# Patient Record
Sex: Male | Born: 1975
Health system: Southern US, Community
[De-identification: ages and names within clinical notes are randomized; demographics above are authoritative.]

## PROBLEM LIST (undated history)

## (undated) DIAGNOSIS — E7800 Pure hypercholesterolemia, unspecified: Secondary | ICD-10-CM

## (undated) DIAGNOSIS — I255 Ischemic cardiomyopathy: Secondary | ICD-10-CM

## (undated) DIAGNOSIS — I219 Acute myocardial infarction, unspecified: Secondary | ICD-10-CM

## (undated) DIAGNOSIS — I472 Ventricular tachycardia, unspecified: Secondary | ICD-10-CM

## (undated) DIAGNOSIS — G4733 Obstructive sleep apnea (adult) (pediatric): Principal | ICD-10-CM

## (undated) DIAGNOSIS — C801 Malignant (primary) neoplasm, unspecified: Secondary | ICD-10-CM

## (undated) DIAGNOSIS — M545 Low back pain, unspecified: Secondary | ICD-10-CM

## (undated) DIAGNOSIS — I639 Cerebral infarction, unspecified: Secondary | ICD-10-CM

## (undated) DIAGNOSIS — M549 Dorsalgia, unspecified: Secondary | ICD-10-CM

## (undated) DIAGNOSIS — I4729 Other ventricular tachycardia: Secondary | ICD-10-CM

## (undated) DIAGNOSIS — Z720 Tobacco use: Secondary | ICD-10-CM

## (undated) DIAGNOSIS — Z8619 Personal history of other infectious and parasitic diseases: Secondary | ICD-10-CM

## (undated) DIAGNOSIS — T827XXA Infection and inflammatory reaction due to other cardiac and vascular devices, implants and grafts, initial encounter: Secondary | ICD-10-CM

## (undated) DIAGNOSIS — I5022 Chronic systolic (congestive) heart failure: Secondary | ICD-10-CM

## (undated) DIAGNOSIS — I2510 Atherosclerotic heart disease of native coronary artery without angina pectoris: Secondary | ICD-10-CM

## (undated) DIAGNOSIS — K219 Gastro-esophageal reflux disease without esophagitis: Secondary | ICD-10-CM

## (undated) DIAGNOSIS — F411 Generalized anxiety disorder: Secondary | ICD-10-CM

## (undated) HISTORY — DX: Ventricular tachycardia, unspecified: I47.20

## (undated) HISTORY — DX: Low back pain: M54.5

## (undated) HISTORY — PX: CORONARY ANGIOPLASTY WITH STENT PLACEMENT: SHX49

## (undated) HISTORY — DX: Obstructive sleep apnea (adult) (pediatric): G47.33

## (undated) HISTORY — DX: Ventricular tachycardia: I47.2

## (undated) HISTORY — PX: CORONARY ARTERY BYPASS GRAFT: SHX141

## (undated) HISTORY — DX: Atherosclerotic heart disease of native coronary artery without angina pectoris: I25.10

## (undated) HISTORY — DX: Cerebral infarction, unspecified: I63.9

## (undated) HISTORY — DX: Other ventricular tachycardia: I47.29

## (undated) HISTORY — DX: Dorsalgia, unspecified: M54.9

## (undated) HISTORY — DX: Low back pain, unspecified: M54.50

## (undated) HISTORY — DX: Chronic systolic (congestive) heart failure: I50.22

## (undated) HISTORY — PX: CARDIAC DEFIBRILLATOR PLACEMENT: SHX171

## (undated) HISTORY — DX: Tobacco use: Z72.0

## (undated) HISTORY — DX: Ischemic cardiomyopathy: I25.5

## (undated) HISTORY — DX: Gastro-esophageal reflux disease without esophagitis: K21.9

## (undated) HISTORY — DX: Acute myocardial infarction, unspecified: I21.9

## (undated) HISTORY — DX: Pure hypercholesterolemia, unspecified: E78.00

## (undated) HISTORY — DX: Generalized anxiety disorder: F41.1

---

## 1997-12-01 ENCOUNTER — Emergency Department (HOSPITAL_COMMUNITY): Admission: EM | Admit: 1997-12-01 | Discharge: 1997-12-01 | Payer: Self-pay | Admitting: Emergency Medicine

## 1997-12-01 ENCOUNTER — Encounter: Payer: Self-pay | Admitting: Emergency Medicine

## 2002-05-24 ENCOUNTER — Emergency Department (HOSPITAL_COMMUNITY): Admission: EM | Admit: 2002-05-24 | Discharge: 2002-05-24 | Payer: Self-pay | Admitting: Emergency Medicine

## 2002-07-31 ENCOUNTER — Encounter: Payer: Self-pay | Admitting: Emergency Medicine

## 2002-07-31 ENCOUNTER — Emergency Department (HOSPITAL_COMMUNITY): Admission: EM | Admit: 2002-07-31 | Discharge: 2002-07-31 | Payer: Self-pay | Admitting: Emergency Medicine

## 2008-01-29 ENCOUNTER — Inpatient Hospital Stay (HOSPITAL_COMMUNITY): Admission: EM | Admit: 2008-01-29 | Discharge: 2008-02-02 | Payer: Self-pay | Admitting: Emergency Medicine

## 2008-01-29 ENCOUNTER — Ambulatory Visit: Payer: Self-pay | Admitting: Cardiology

## 2008-01-30 ENCOUNTER — Encounter: Payer: Self-pay | Admitting: Cardiology

## 2008-05-07 ENCOUNTER — Emergency Department (HOSPITAL_BASED_OUTPATIENT_CLINIC_OR_DEPARTMENT_OTHER): Admission: EM | Admit: 2008-05-07 | Discharge: 2008-05-07 | Payer: Self-pay | Admitting: Emergency Medicine

## 2008-09-17 ENCOUNTER — Inpatient Hospital Stay (HOSPITAL_COMMUNITY): Admission: EM | Admit: 2008-09-17 | Discharge: 2008-09-20 | Payer: Self-pay | Admitting: Emergency Medicine

## 2008-09-18 ENCOUNTER — Encounter (INDEPENDENT_AMBULATORY_CARE_PROVIDER_SITE_OTHER): Payer: Self-pay | Admitting: Cardiology

## 2008-09-20 ENCOUNTER — Encounter: Payer: Self-pay | Admitting: Internal Medicine

## 2008-11-24 ENCOUNTER — Encounter: Payer: Self-pay | Admitting: Internal Medicine

## 2008-12-01 ENCOUNTER — Emergency Department (HOSPITAL_COMMUNITY): Admission: EM | Admit: 2008-12-01 | Discharge: 2008-12-01 | Payer: Self-pay | Admitting: Emergency Medicine

## 2008-12-01 ENCOUNTER — Encounter: Payer: Self-pay | Admitting: Internal Medicine

## 2008-12-19 ENCOUNTER — Encounter (INDEPENDENT_AMBULATORY_CARE_PROVIDER_SITE_OTHER): Payer: Self-pay | Admitting: Internal Medicine

## 2008-12-19 ENCOUNTER — Ambulatory Visit: Payer: Self-pay | Admitting: Internal Medicine

## 2008-12-19 DIAGNOSIS — M545 Low back pain: Secondary | ICD-10-CM

## 2008-12-19 DIAGNOSIS — I2510 Atherosclerotic heart disease of native coronary artery without angina pectoris: Secondary | ICD-10-CM | POA: Insufficient documentation

## 2008-12-19 DIAGNOSIS — M25529 Pain in unspecified elbow: Secondary | ICD-10-CM

## 2008-12-19 DIAGNOSIS — I219 Acute myocardial infarction, unspecified: Secondary | ICD-10-CM | POA: Insufficient documentation

## 2008-12-28 DIAGNOSIS — R079 Chest pain, unspecified: Secondary | ICD-10-CM | POA: Insufficient documentation

## 2008-12-29 ENCOUNTER — Ambulatory Visit: Payer: Self-pay | Admitting: Internal Medicine

## 2008-12-29 ENCOUNTER — Encounter (INDEPENDENT_AMBULATORY_CARE_PROVIDER_SITE_OTHER): Payer: Self-pay | Admitting: *Deleted

## 2009-01-02 ENCOUNTER — Ambulatory Visit: Payer: Self-pay | Admitting: Internal Medicine

## 2009-01-02 ENCOUNTER — Inpatient Hospital Stay (HOSPITAL_COMMUNITY): Admission: RE | Admit: 2009-01-02 | Discharge: 2009-01-03 | Payer: Self-pay | Admitting: Internal Medicine

## 2009-01-03 ENCOUNTER — Encounter: Payer: Self-pay | Admitting: Internal Medicine

## 2009-01-15 ENCOUNTER — Ambulatory Visit: Payer: Self-pay

## 2009-01-15 ENCOUNTER — Ambulatory Visit: Payer: Self-pay | Admitting: Internal Medicine

## 2009-01-15 ENCOUNTER — Encounter: Payer: Self-pay | Admitting: Internal Medicine

## 2009-02-08 ENCOUNTER — Encounter (INDEPENDENT_AMBULATORY_CARE_PROVIDER_SITE_OTHER): Payer: Self-pay | Admitting: *Deleted

## 2009-03-08 ENCOUNTER — Inpatient Hospital Stay (HOSPITAL_COMMUNITY): Admission: RE | Admit: 2009-03-08 | Discharge: 2009-03-09 | Payer: Self-pay | Admitting: Cardiology

## 2009-03-12 ENCOUNTER — Ambulatory Visit (HOSPITAL_COMMUNITY): Admission: RE | Admit: 2009-03-12 | Discharge: 2009-03-12 | Payer: Self-pay | Admitting: Cardiology

## 2009-03-12 ENCOUNTER — Telehealth: Payer: Self-pay | Admitting: Infectious Diseases

## 2009-03-13 ENCOUNTER — Encounter (HOSPITAL_COMMUNITY): Admission: RE | Admit: 2009-03-13 | Discharge: 2009-03-29 | Payer: Self-pay | Admitting: Cardiology

## 2009-03-26 ENCOUNTER — Ambulatory Visit: Payer: Self-pay | Admitting: Thoracic Surgery (Cardiothoracic Vascular Surgery)

## 2009-04-09 ENCOUNTER — Encounter: Payer: Self-pay | Admitting: Thoracic Surgery (Cardiothoracic Vascular Surgery)

## 2009-04-09 ENCOUNTER — Ambulatory Visit: Payer: Self-pay | Admitting: Vascular Surgery

## 2009-04-09 ENCOUNTER — Ambulatory Visit: Payer: Self-pay | Admitting: Thoracic Surgery (Cardiothoracic Vascular Surgery)

## 2009-04-12 ENCOUNTER — Telehealth (INDEPENDENT_AMBULATORY_CARE_PROVIDER_SITE_OTHER): Payer: Self-pay | Admitting: *Deleted

## 2009-04-13 ENCOUNTER — Ambulatory Visit: Payer: Self-pay | Admitting: Thoracic Surgery (Cardiothoracic Vascular Surgery)

## 2009-04-13 ENCOUNTER — Inpatient Hospital Stay (HOSPITAL_COMMUNITY)
Admission: RE | Admit: 2009-04-13 | Discharge: 2009-04-17 | Payer: Self-pay | Admitting: Thoracic Surgery (Cardiothoracic Vascular Surgery)

## 2009-04-25 ENCOUNTER — Ambulatory Visit: Payer: Self-pay | Admitting: Cardiothoracic Surgery

## 2009-05-04 ENCOUNTER — Encounter: Payer: Self-pay | Admitting: Internal Medicine

## 2009-05-07 ENCOUNTER — Ambulatory Visit: Payer: Self-pay | Admitting: Internal Medicine

## 2009-05-07 ENCOUNTER — Ambulatory Visit: Payer: Self-pay | Admitting: Thoracic Surgery (Cardiothoracic Vascular Surgery)

## 2009-05-07 ENCOUNTER — Encounter
Admission: RE | Admit: 2009-05-07 | Discharge: 2009-05-07 | Payer: Self-pay | Admitting: Thoracic Surgery (Cardiothoracic Vascular Surgery)

## 2009-05-16 ENCOUNTER — Ambulatory Visit: Payer: Self-pay | Admitting: Internal Medicine

## 2009-05-16 DIAGNOSIS — F411 Generalized anxiety disorder: Secondary | ICD-10-CM | POA: Insufficient documentation

## 2009-05-16 LAB — CONVERTED CEMR LAB
BUN: 14 mg/dL
Creatinine, Ser: 1.42 mg/dL
HCT: 39.7 %
Platelets: 138 10*3/uL

## 2009-06-13 ENCOUNTER — Ambulatory Visit: Payer: Self-pay | Admitting: Infectious Diseases

## 2009-07-06 ENCOUNTER — Telehealth (INDEPENDENT_AMBULATORY_CARE_PROVIDER_SITE_OTHER): Payer: Self-pay | Admitting: *Deleted

## 2009-08-08 ENCOUNTER — Encounter (INDEPENDENT_AMBULATORY_CARE_PROVIDER_SITE_OTHER): Payer: Self-pay | Admitting: *Deleted

## 2009-09-20 ENCOUNTER — Encounter (INDEPENDENT_AMBULATORY_CARE_PROVIDER_SITE_OTHER): Payer: Self-pay | Admitting: *Deleted

## 2009-11-06 ENCOUNTER — Encounter (INDEPENDENT_AMBULATORY_CARE_PROVIDER_SITE_OTHER): Payer: Self-pay | Admitting: *Deleted

## 2009-11-17 ENCOUNTER — Observation Stay (HOSPITAL_COMMUNITY): Admission: EM | Admit: 2009-11-17 | Discharge: 2009-11-18 | Payer: Self-pay | Admitting: Emergency Medicine

## 2009-11-17 ENCOUNTER — Ambulatory Visit: Payer: Self-pay | Admitting: Cardiology

## 2010-03-06 ENCOUNTER — Telehealth: Payer: Self-pay | Admitting: Physician Assistant

## 2010-03-06 ENCOUNTER — Ambulatory Visit: Payer: Self-pay | Admitting: Internal Medicine

## 2010-03-06 ENCOUNTER — Encounter: Payer: Self-pay | Admitting: Physician Assistant

## 2010-03-06 ENCOUNTER — Ambulatory Visit: Payer: Self-pay

## 2010-03-06 ENCOUNTER — Encounter: Payer: Self-pay | Admitting: Internal Medicine

## 2010-03-06 DIAGNOSIS — R0602 Shortness of breath: Secondary | ICD-10-CM

## 2010-03-07 ENCOUNTER — Ambulatory Visit (HOSPITAL_COMMUNITY)
Admission: RE | Admit: 2010-03-07 | Discharge: 2010-03-07 | Payer: Self-pay | Source: Home / Self Care | Attending: Physician Assistant | Admitting: Physician Assistant

## 2010-03-07 LAB — CONVERTED CEMR LAB
BUN: 9 mg/dL (ref 6–23)
Basophils Absolute: 0.1 10*3/uL (ref 0.0–0.1)
Basophils Relative: 0.8 % (ref 0.0–3.0)
Calcium: 9 mg/dL (ref 8.4–10.5)
Eosinophils Absolute: 0.5 10*3/uL (ref 0.0–0.7)
Eosinophils Relative: 6.2 % — ABNORMAL HIGH (ref 0.0–5.0)
MCHC: 33.7 g/dL (ref 30.0–36.0)
MCV: 88.8 fL (ref 78.0–100.0)
Monocytes Relative: 10.7 % (ref 3.0–12.0)
Neutrophils Relative %: 51.3 % (ref 43.0–77.0)
Platelets: 199 10*3/uL (ref 150.0–400.0)
Potassium: 4.3 meq/L (ref 3.5–5.1)
Pro B Natriuretic peptide (BNP): 151.4 pg/mL — ABNORMAL HIGH (ref 0.0–100.0)
WBC: 8.4 10*3/uL (ref 4.5–10.5)

## 2010-03-11 ENCOUNTER — Encounter: Payer: Self-pay | Admitting: Internal Medicine

## 2010-03-13 ENCOUNTER — Telehealth (INDEPENDENT_AMBULATORY_CARE_PROVIDER_SITE_OTHER): Payer: Self-pay | Admitting: Radiology

## 2010-03-14 ENCOUNTER — Ambulatory Visit: Payer: Self-pay

## 2010-03-14 ENCOUNTER — Encounter (HOSPITAL_COMMUNITY)
Admission: RE | Admit: 2010-03-14 | Discharge: 2010-04-30 | Payer: Self-pay | Source: Home / Self Care | Attending: Internal Medicine | Admitting: Internal Medicine

## 2010-03-14 ENCOUNTER — Encounter: Payer: Self-pay | Admitting: Cardiovascular Disease

## 2010-03-14 ENCOUNTER — Encounter: Payer: Self-pay | Admitting: Internal Medicine

## 2010-03-14 ENCOUNTER — Ambulatory Visit (HOSPITAL_COMMUNITY)
Admission: RE | Admit: 2010-03-14 | Discharge: 2010-03-14 | Payer: Self-pay | Source: Home / Self Care | Attending: Internal Medicine | Admitting: Internal Medicine

## 2010-03-18 ENCOUNTER — Telehealth: Payer: Self-pay | Admitting: Physician Assistant

## 2010-03-22 ENCOUNTER — Ambulatory Visit: Payer: Self-pay | Admitting: Internal Medicine

## 2010-03-22 DIAGNOSIS — I472 Ventricular tachycardia, unspecified: Secondary | ICD-10-CM | POA: Insufficient documentation

## 2010-03-22 DIAGNOSIS — I5042 Chronic combined systolic (congestive) and diastolic (congestive) heart failure: Secondary | ICD-10-CM | POA: Insufficient documentation

## 2010-04-10 ENCOUNTER — Other Ambulatory Visit: Payer: Self-pay | Admitting: Internal Medicine

## 2010-04-10 ENCOUNTER — Ambulatory Visit
Admission: RE | Admit: 2010-04-10 | Discharge: 2010-04-10 | Payer: Self-pay | Source: Home / Self Care | Attending: Internal Medicine | Admitting: Internal Medicine

## 2010-04-10 DIAGNOSIS — K219 Gastro-esophageal reflux disease without esophagitis: Secondary | ICD-10-CM | POA: Insufficient documentation

## 2010-04-10 LAB — BASIC METABOLIC PANEL
BUN: 17 mg/dL (ref 6–23)
CO2: 28 mEq/L (ref 19–32)
Calcium: 9.6 mg/dL (ref 8.4–10.5)
Chloride: 105 mEq/L (ref 96–112)
Creatinine, Ser: 1.4 mg/dL (ref 0.4–1.5)
GFR: 63.31 mL/min (ref >60.00)
Glucose, Bld: 80 mg/dL (ref 70–99)
Potassium: 4.6 mEq/L (ref 3.5–5.1)
Sodium: 140 mEq/L (ref 135–145)

## 2010-04-10 LAB — BRAIN NATRIURETIC PEPTIDE: Pro B Natriuretic peptide (BNP): 84.1 pg/mL (ref 0.0–100.0)

## 2010-04-28 LAB — CONVERTED CEMR LAB
Basophils Absolute: 0.1 10*3/uL (ref 0.0–0.1)
Basophils Relative: 1.2 % (ref 0.0–3.0)
CO2: 32 meq/L (ref 19–32)
Calcium: 9.2 mg/dL (ref 8.4–10.5)
Creatinine, Ser: 1.4 mg/dL (ref 0.4–1.5)
Eosinophils Absolute: 0.3 10*3/uL (ref 0.0–0.7)
Eosinophils Relative: 3.1 % (ref 0.0–5.0)
GFR calc non Af Amer: 61.15 mL/min (ref >60)
INR: 0.9 (ref 0.8–1.0)
Lymphocytes Relative: 31.8 % (ref 12.0–46.0)
Lymphs Abs: 3 10*3/uL (ref 0.7–4.0)
MCHC: 33.2 g/dL (ref 30.0–36.0)
MCV: 90 fL (ref 78.0–100.0)
Monocytes Absolute: 0.8 10*3/uL (ref 0.1–1.0)
Neutro Abs: 5.1 10*3/uL (ref 1.4–7.7)
Neutrophils Relative %: 55.8 % (ref 43.0–77.0)
Potassium: 4.3 meq/L (ref 3.5–5.1)
Sodium: 141 meq/L (ref 135–145)
WBC: 9.3 10*3/uL (ref 4.5–10.5)

## 2010-05-02 NOTE — Letter (Signed)
Summary: Appointment - Missed  [facility], Main Office  [address]   [place], [place] [address]   Phone: [phone]  Fax: [phone]     Aug 08, 2009 MRN: [id number]   COLESON KANT [address] [place], [place]  [address]   Dear Mr. HEMMER,  Our records indicate you missed your appointment on 5/9 with [facility]. It is very important that we reach you to reschedule this appointment. We look forward to participating in your health care needs. Please contact us at the number listed above at your earliest convenience to reschedule this appointment.     Sincerely,   Ruel Favors Scheduling Team

## 2010-05-02 NOTE — Assessment & Plan Note (Signed)
Summary: ACUTE-OUT OF XANAX/(RIOFRIO)/CFB   Vital Signs:  Patient profile:   35 year old male Height:      74 inches (187.96 cm) Weight:      252.9 pounds (114.95 kg) Temp:     97.0 degrees F (36.11 degrees C) oral Pulse rate:   71 / minute BP sitting:   113 / 67  (left arm) Cuff size:   large  Vitals Entered By: Krystal Eaton Duncan Dull) (May 16, 2009 9:24 AM) CC: refill xanax and percocet Is Patient Diabetic? No Pain Assessment Patient in pain? no      Nutritional Status BMI of > 30 = obese  Have you ever been in a relationship where you felt threatened, hurt or afraid?No   Does patient need assistance? Functional Status Self care Ambulation Normal   Immunization History:  Influenza Immunization History:    Influenza:  historical (09/28/2008)   Primary Care Provider:  [facility] out pt  CC:  refill xanax and percocet.  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 Patient has been on Xanax for over 1 year now, with 2 tabs daily.   2) Refill on Percocet - patient reports he received percocet after surgery for pain management. He reports he still has pain on the scar. He also has back pain.   Preventive Screening-Counseling & Management  Alcohol-Tobacco     Smoking Status: quit < 6 months     Smoking Cessation Counseling: yes     Packs/Day: 1/2 ppd  -  Date:  05/16/2009    BUN: 14    Creatinine: 1.42    Sodium: 134    Potassium: 4.1    Chloride: 94    CO2 Total: 31    WBC: 16.2    HGB: 13.1    HCT: 39.7    PLT: 138  Current Medications (verified): 1)  Aspirin 325 Mg Tabs (Aspirin) .Marland Kitchen.. 1 Tab Daily 2)  Metoprolol Tartrate 25 Mg Tabs (Metoprolol  Tartrate) .... Take 1 Tablet By Mouth Two Times A Day 3)  Simvastatin 40 Mg Tabs (Simvastatin) .Marland Kitchen.. 1 Tab Daily 4)  Alprazolam 1 Mg Tabs (Alprazolam) .Marland Kitchen.. 1 Tab Two Times A Day 5)  Percocet 5-325 Mg Tabs (Oxycodone-Acetaminophen) .... Uad 6)  Flexeril 10 Mg Tabs (Cyclobenzaprine Hcl) .... As Needed 7)  Pacerone 200 Mg Tabs (Amiodarone Hcl) .... Take 1 Tablet By Mouth Two Times A Day 8)  Aspir-Trin 325 Mg Tbec (Aspirin) .... Take 1 Tablet By Mouth Once A Day  Allergies (verified): No Known Drug Allergies  Past History:  Past Medical History: Last updated: 05/07/2009 CAD s/p AMI 10/09 and 6/10 S/p PCI proximal LAD 10/09 with PTCA of ISR 6/10 Single vessel CABG 1/11 Ischemic CM (EF 30%) NYHA Class III CHF HYPERCHOLESTEROLEMIA (ICD-272.0) LOW BACK PAIN  ELBOW PAIN, RIGHT  TOBACCO ABUSE   Family History: Last updated: January 14, 2009 Father- first MI at 37, died at 55 after mult MI Mother- denies DM, HTN, or CAD No other relatives with early onset CAD  Social History: Last updated: 05/16/2009 Pt lives with his mom, brother, and 67 yo son in [place]. Quit in Jan. 2011 prior to heart surgery (prior -1/2 PPD (down  from 1 ppd per day, onset age 32)  alcohol abuse- prior, hasn't drank since age 47 no illicit drug use  Currently unemployed, awaiting disability Single  Risk Factors: Smoking Status: quit < 6 months (05/16/2009) Packs/Day: 1/2 ppd (05/16/2009)  Past Surgical History: S/P CABG 03/2009 due to reocclusion of LAD ICD placement 0ct. 2010  LAD stenting 10/09 and re-opening 6/10  Repeat catheterization 6/10 after PTCA:showed his EF was around 30-35% with anterior  akinesis.  He had 30% in-stent restenosis of the proximal LAD lesion,  30% proximal lesion in the second diagonal and a 40% proximal lesion in  the LAD at the bifurcation of the second diagonal.  There did not appear  to be obstructive disease to the point where he required any more  vascularization such as  bypass surgery.  Social History: Pt lives with his mom, brother, and 71 yo son in [place]. Quit in Jan. 2011 prior to heart surgery (prior -1/2 PPD (down from 1 ppd per day, onset age 49)  alcohol abuse- prior, hasn't drank since age 70 no illicit drug use  Currently unemployed, awaiting disability Single Smoking Status:  quit < 6 months  Review of Systems CV:  Denies chest pain or discomfort, difficulty breathing at night, difficulty breathing while lying down, fainting, fatigue, leg cramps with exertion, lightheadness, palpitations, shortness of breath with exertion, and swelling of feet. Resp:  Denies cough, shortness of breath, and sputum productive. GI:  Denies abdominal pain, constipation, diarrhea, nausea, and vomiting. MS:  Complains of mid back pain and stiffness. Derm:  Denies poor wound healing.  Physical Exam  General:  alert and well-developed.   Head:  normocephalic and atraumatic.   Eyes:  vision grossly intact, pupils equal, pupils round, and pupils reactive to light.   Mouth:  fair dentition.   Neck:  supple, full ROM, no masses, and no JVD.   Chest Wall:  surgical scar and sternotomy scar.   Lungs:  normal respiratory effort and normal breath sounds.   Heart:  normal rate, no murmur, no gallop, no rub, and irregular rhythm.   Abdomen:  soft, non-tender, normal bowel sounds, no distention, no masses, and no guarding.   Msk:  normal ROM, no joint tenderness, no joint swelling, and no joint warmth.   Pulses:  R radial normal and L radial normal.   Extremities:  no LE edema  Neurologic:  alert & oriented X3 and cranial nerves II-XII intact.   Psych:  normally interactive, good eye contact, and not anxious appearing.     Impression & Recommendations:  Problem # 1:  ISCHEMIC CARDIOMYOPATHY (ICD-414.8) Assessment Comment Only Patient has extensive cardiac history with 2 MIs (10/09 and 06/10), s/p stenting, PCTA, and ICD placement 12/2008. As discussed  thoroughly in last discharge summary, patient developed signs of CHF, as well as fatigue and chest pain. Repeat cardiac cath in 02/2009 revealed reocclusion of LAD and patient is now s/p CABG 03/2009. Patient developed acute renal failure and a-fib post-operatively. Pt is followed by [facility] and is scheduled to be seen 05/16/2009. Patient has no chest pain, and is doing well post-operatively. Will defer to cardiology for cardiac management.   His updated medication list for this problem includes:    Aspirin 325 Mg Tabs (Aspirin) .Marland Kitchen... 1 tab daily    Metoprolol Tartrate 25 Mg Tabs (Metoprolol tartrate) .Marland Kitchen... Take 1 tablet by mouth two times a day    Pacerone 200 Mg Tabs (Amiodarone hcl) .Marland Kitchen... Take 1 tablet by  mouth two times a day    Aspir-trin 325 Mg Tbec (Aspirin) .Marland Kitchen... Take 1 tablet by mouth once a day  Problem # 2:  ANXIETY (ICD-300.00) Assessment: Comment Only Patient has hx of anxiety 2/2 stress. Pt reports his stress is related to obtaining disability so that he is able to provide for his 22 year old son. He has been on Xanax for over 1 year, and reports it has been helping his anxiety levels. I will refill his prescription at his current dose.  His updated medication list for this problem includes:    Alprazolam 1 Mg Tabs (Alprazolam) .Marland Kitchen... 1 tab two times a day  Problem # 3:  CHEST PAIN (ICD-786.50) Assessment: Comment Only S/P CABG 03/2009. Pt has residual pain from surgery and was given Percocet for pain management. He states he initially received Tramadol and developed nausea and vomiting. He also mentions that he has the same reaction to Oxycontin. He is still having some pain from the surgical site. The scar is healing well. I explained to the patient that his pain levels should be decreasing as more time passes. I will refill his prescription, and instructed patient to follow up in 1 month, to reassess his pain.   Complete Medication List: 1)  Aspirin 325 Mg Tabs  (Aspirin) .Marland Kitchen.. 1 tab daily 2)  Metoprolol Tartrate 25 Mg Tabs (Metoprolol tartrate) .... Take 1 tablet by mouth two times a day 3)  Simvastatin 40 Mg Tabs (Simvastatin) .Marland Kitchen.. 1 tab daily 4)  Alprazolam 1 Mg Tabs (Alprazolam) .Marland Kitchen.. 1 tab two times a day 5)  Percocet 5-325 Mg Tabs (Oxycodone-acetaminophen) .... Uad 6)  Flexeril 10 Mg Tabs (Cyclobenzaprine hcl) .... As needed 7)  Pacerone 200 Mg Tabs (Amiodarone hcl) .... Take 1 tablet by mouth two times a day 8)  Aspir-trin 325 Mg Tbec (Aspirin) .... Take 1 tablet by mouth once a day  Patient Instructions: 1)  Please schedule a follow-up appointment in 1 month. Prescriptions: ALPRAZOLAM 1 MG TABS (ALPRAZOLAM) 1 tab two times a day  #60 x 0   Entered and Authorized by:   Melida Quitter MD   Signed by:   Melida Quitter MD on 05/16/2009   Method used:   Print then Give to Patient   RxID:   [id number] PERCOCET 5-325 MG TABS (OXYCODONE-ACETAMINOPHEN) UAD  #60 x 0   Entered and Authorized by:   Melida Quitter MD   Signed by:   Melida Quitter MD on 05/16/2009   Method used:   Print then Give to Patient   RxID:   [id number]    Prevention & Chronic Care Immunizations   Influenza vaccine: Historical  (09/28/2008)   Influenza vaccine deferral: Refused  (05/16/2009)    Tetanus booster: Not documented    Pneumococcal vaccine: Not documented  Other Screening   Smoking status: quit < 6 months  (05/16/2009)  Lipids   Total Cholesterol: Not documented   LDL: Not documented   LDL Direct: Not documented   HDL: Not documented   Triglycerides: Not documented    SGOT (AST): Not documented   SGPT (ALT): Not documented   Alkaline phosphatase: Not documented   Total bilirubin: Not documented  Self-Management Support :    Patient will work on the following items until the next clinic visit to reach self-care goals:     Medications and monitoring: take my medicines every day  (05/16/2009)     Eating: eat more vegetables, eat foods  that are low in salt, eat  baked foods instead of fried foods  (05/16/2009)    Lipid self-management support: Not documented

## 2010-05-02 NOTE — Progress Notes (Signed)
Summary: pt rtn call from friday  Phone Note Call from Patient Call back at [phone] or [phone]   Caller: Patient Reason for Call: Talk to Nurse, Talk to Doctor Summary of Call: pt rtn call from friday Initial call taken by: Omer Jack,  March 18, 2010 2:26 PM  Follow-up for Phone Call        Phone Call Completed PT'S MOTHER AWARE TO HAVE PT  DECREASE SIMVASTATIN TO 20 MG AND TO REPEAT LIPID LIVER IN 8 WEEKS Follow-up by: Scherrie Bateman, LPN,  March 18, 2010 2:58 PM  Additional Follow-up for Phone Call Additional follow up Details #1::        Thanks Additional Follow-up by: Brynda Rim,  March 18, 2010 5:37 PM

## 2010-05-02 NOTE — Assessment & Plan Note (Signed)
Summary: f2w   Visit Type:  Follow-up Referring Provider:  Corliss Marcus, MD Primary Provider:   out pt   History of Present Illness: Mr Ende is a pleasant 35 yo WM s/p ICD for primary prevention of SCD who presents today for cardiology and ep follow-up.  He reports moderate improvement in dypsnea with recently added lasix.  His chest discomfort and "indigestion" has significantly improved with nexium and imdur, though he reports headaches with imdur.  He continues to have 6 pillow orthopnea.  He denies edema.   He denies palpitations, presyncope, syncope, or other concerns. He has been previously noncompliant with office visits due to finances but states that he is compliant with medication.    Current Medications (verified): 1)  Aspirin 325 Mg Tabs (Aspirin) .Marland Kitchen.. 1 Tab Daily 2)  Metoprolol Tartrate 25 Mg Tabs (Metoprolol Tartrate) .... Take 1 Tablet By Mouth Two Times A Day 3)  Simvastatin 40 Mg Tabs (Simvastatin) .... Take 1/2 Tablet Daily(20mg ) 4)  Alprazolam 1 Mg Tabs (Alprazolam) .... As Needed 5)  Flonase 50 Mcg/act Susp (Fluticasone Propionate) .... One Spray Each Nostril Daily 6)  Lisinopril 10 Mg Tabs (Lisinopril) .... Take 1/2 Tablet By Mouth Daily 7)  Isosorbide Mononitrate Cr 30 Mg Xr24h-Tab (Isosorbide Mononitrate) .... Take One Tablet By Mouth Daily 8)  Nexium 40 Mg Cpdr (Esomeprazole Magnesium) .... One By Mouth Daily 9)  Nitrostat 0.4 Mg Subl (Nitroglycerin) .... Use As Directed  Allergies: 1)  ! * Oxycontin  Past History:  Past Medical History: Reviewed history from 03/22/2010 and no changes required. CAD s/p AMI 10/09 and 6/10 S/p PCI proximal LAD 10/09 with PTCA of ISR 6/10 Single vessel CABG 1/11   a.  cath 02/2009:  CFX and RCA ok with R-L collats. to occluded LAD   b.  placed on amiodarone post op for afib Ischemic CM (EF 30%) NYHA Class III CHF HYPERCHOLESTEROLEMIA (ICD-272.0) LOW BACK PAIN  ELBOW PAIN, RIGHT  TOBACCO ABUSE  Nonsustained  ventricular tachycardia  Past Surgical History: Reviewed history from 05/16/2009 and no changes required. S/P CABG 03/2009 due to reocclusion of LAD ICD placement 0ct. 2010  LAD stenting 10/09 and re-opening 6/10  Repeat catheterization 6/10 after PTCA:showed his EF was around 30-35% with anterior  akinesis.  He had 30% in-stent restenosis of the proximal LAD lesion,  30% proximal lesion in the second diagonal and a 40% proximal lesion in  the LAD at the bifurcation of the second diagonal.  There did not appear  to be obstructive disease to the point where he required any more  vascularization such as bypass surgery.  Social History: Reviewed history from 05/16/2009 and no changes required. Pt lives with his mom, brother, and 4 yo son in [place]. Quit in Jan. 2011 prior to heart surgery (prior -1/2 PPD (down from 1 ppd per day, onset age 63)  alcohol abuse- prior, hasn't drank since age 31 no illicit drug use  Currently unemployed, awaiting disability Single  Review of Systems       All systems are reviewed and negative except as listed in the HPI.   Vital Signs:  Patient profile:   35 year old male Height:      74 inches Weight:      279 pounds BMI:     35.95 Pulse rate:   75 / minute BP sitting:   116 / 78  (left arm)  Vitals Entered By: Laurance Flatten CMA (April 10, 2010 10:18 AM)  Physical Exam  General:  Well developed, well nourished, in no acute distress. Head:  normocephalic and atraumatic Eyes:  PERRLA/EOM intact; conjunctiva and lids normal. Mouth:  Teeth, gums and palate normal. Oral mucosa normal. Neck:  Neck supple, no JVD. No masses, thyromegaly or abnormal cervical nodes. Lungs:  Clear bilaterally to auscultation and percussion. Heart:  Non-displaced PMI, chest non-tender; regular rate and rhythm, S1, S2 without murmurs, rubs or gallops. Carotid upstroke normal, no bruit. Normal abdominal aortic size, no bruits. Femorals normal pulses, no bruits. Pedals  normal pulses. No edema, no varicosities. Abdomen:  Bowel sounds positive; abdomen soft and non-tender without masses, organomegaly, or hernias noted. No hepatosplenomegaly. Msk:  Back normal, normal gait. Muscle strength and tone normal. Extremities:  No clubbing or cyanosis. Neurologic:  Alert and oriented x 3. Skin:  see exam under "chest wall"    ICD Specifications Following MD:  Hillis Range, MD     Referring MD:  [facility] ICD Vendor:  [employer]     ICD Model Number:  E110     ICD Serial Number:  [id number] ICD DOI:  01/02/2009     ICD Implanting MD:  Hillis Range, MD  Lead 1:    Location: RA     DOI: 01/02/2009     Model #: [id number]     Serial #: [id number]     Status: active Lead 2:    Location: RV     DOI: 01/02/2009     Model #: [id number]     Serial #: [id number]     Status: active  Indications::  ICM   ICD Follow Up Battery Voltage:  GOOD V     Charge Time:  8.6 seconds     Battery Est. Longevity:  10.5 YRS Underlying rhythm:  SR ICD Dependent:  No       ICD Device Measurements Atrium:  Amplitude: 6.0 mV, Impedance: 578 ohms, Threshold: 0.7 V at 0.4 msec Right Ventricle:  Amplitude: 19.8 mV, Impedance: 506 ohms, Threshold: 1.2 V at 0.4 msec Shock Impedance: 68 ohms   Episodes MS Episodes:  0     Coumadin:  No Shock:  0     ATP:  0     Nonsustained:  0     Atrial Therapies:  0 Atrial Pacing:  <1%     Ventricular Pacing:  0%  Brady Parameters Mode DDI     Lower Rate Limit:  50     PAV 350      Tachy Zones VF:  200     VT:  170     Next Cardiology Appt Due:  07/01/2010 Tech Comments:  NORMAL DEVICE FUNCTION.  NO EPISODES SINCE LAST CHECK.  NO CHANGES MADE. ROV IN 3 MTHS W/DEVICE CLINIC. 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 1 tab daily    Carvedilol 6.25 Mg Tabs (Carvedilol) ..... One by mouth bid    Lisinopril 10 Mg Tabs (Lisinopril) .Marland KitchenMarland KitchenMarland KitchenMarland Kitchen  Take 1tablet by mouth daily    Isosorbide Mononitrate Cr 30 Mg Xr24h-tab (Isosorbide mononitrate) .Marland Kitchen... Take one tablet by mouth daily    Nitrostat 0.4 Mg Subl (Nitroglycerin) ..... Use as directed  Problem # 3:  DYSPNEA (ICD-786.05)  continue lasix check BNP  The following medications were removed from the medication list:    Furosemide 40 Mg Tabs (Furosemide) .Marland Kitchen... Take one tablet by mouth daily. His updated medication list for this problem includes:    Aspirin 325 Mg Tabs (Aspirin) .Marland Kitchen... 1 tab daily    Carvedilol 6.25 Mg Tabs (Carvedilol) ..... One by mouth bid    Lisinopril 10 Mg Tabs (Lisinopril) .Marland Kitchen... Take 1tablet by mouth daily  Orders: TLB-BMP (Basic Metabolic Panel-BMET) (80048-METABOL) TLB-BNP (B-Natriuretic Peptide) (83880-BNPR)  Problem # 4:  TOBACCO ABUSE (ICD-305.1) ongoing cessation encouraged  Problem # 5:  GERD (ICD-530.81)  improved with nexium consider GI referral if remains an issue upon return  His updated medication list for this problem includes:    Nexium 40 Mg Cpdr (Esomeprazole magnesium) ..... One by mouth daily  Patient Instructions: 1)  Your physician recommends that you schedule a follow-up appointment in: 6 weeks with Dr Johney Frame and 3 months in the device clinic 2)  Your physician has recommended you make the following change in your medication: increase Lisinopril to 10mg  daily 3)  stop Metoprolol 4)  start Carvedilolo 6.25mg  two times a day 5)  take Isosorbide at night 6)  Your  physician has requested that you limit the intake of sodium (salt) in your diet to two grams daily. Please see [facility] handout. Prescriptions: CARVEDILOL 6.25 MG TABS (CARVEDILOL) one by mouth bid  #60 x 11   Entered by:   Dennis Bast, RN, BSN   Authorized by:   Hillis Range, MD   Signed by:   Dennis Bast, RN, BSN on 04/10/2010   Method used:   Electronically to        [facility]  [phone]* (retail)       [address]       [place], [place]  [address]       Ph: [phone] or [phone]       Fax: [phone]   RxID:   [id number]

## 2010-05-02 NOTE — Progress Notes (Signed)
  Recieved request from [facility] forwarded to [facility]  July 06, 2009 8:37 AM

## 2010-05-02 NOTE — Cardiovascular Report (Signed)
Summary: ICD Industry Check  ICD Industry Check   Imported By: Roderic Ovens 05/16/2009 10:55:16  _____________________________________________________________________  External Attachment:    Type:   Image     Comment:   External Document

## 2010-05-02 NOTE — Cardiovascular Report (Signed)
Summary: Office Visit   Office Visit   Imported By: Roderic Ovens 04/12/2010 09:23:24  _____________________________________________________________________  External Attachment:    Type:   Image     Comment:   External Document

## 2010-05-02 NOTE — Progress Notes (Signed)
  Phone Note Outgoing Call   Summary of Call: Simvastatin changed to 20 mg at bedtime at OV 12/7. Please schedule FLP and LFTs in 8 weeks.  Initial call taken by: Tereso Newcomer PA-C,  March 06, 2010 1:18 PM

## 2010-05-02 NOTE — Miscellaneous (Signed)
  Clinical Lists Changes  Observations: Added new observation of CARDCATHFIND:  1. One-vessel obstructive coronary artery disease with occluded left       anterior descending stent.  This is the third time the stent has       become occluded.   2. Severe left ventricular dysfunction, ejection fraction 25-30% with       severe anterior akinesis.   3. Status post automatic implantable cardioverter-defibrillator.      PLAN:  Admit to telemetry bed, IV heparin, nitroglycerin drip, and   aspirin.  We will review the films of Dr. Eldridge Dace and consider PCI   versus CTS consult.  First, we need to determine whether there is   viability in the anterior wall, which will get a resting thallium for   this.  (03/08/2009 15:43)      Cardiac Cath  Procedure date:  03/08/2009  Findings:       1. One-vessel obstructive coronary artery disease with occluded left       anterior descending stent.  This is the third time the stent has       become occluded.   2. Severe left ventricular dysfunction, ejection fraction 25-30% with       severe anterior akinesis.   3. Status post automatic implantable cardioverter-defibrillator.      PLAN:  Admit to telemetry bed, IV heparin, nitroglycerin drip, and   aspirin.  We will review the films of Dr. Eldridge Dace and consider PCI   versus CTS consult.  First, we need to determine whether there is   viability in the anterior wall, which will get a resting thallium for   this.

## 2010-05-02 NOTE — Assessment & Plan Note (Signed)
Summary: 2 wks/okmper kelly/[email or web address]@ [email or web address]   Visit Type:  Initial Consult Referring Provider:  Corliss Marcus, MD Primary Provider:  [facility] out pt   History of Present Illness: [name] is a pleasant 35 yo WM s/p ICD for primary prevention of SCD who presents today for EP follow-up.  He reports progressive dypsnea at rest and with exertion over the past 3 months.  He also reports 6 pillow orthopnea.  He denies edema.  He has very frequent episodes of chest pain which he feels are likely due to "indigestion".  He reports having some chest pain similar to prior angina both at rest and with exertion.  He denies palpitations, presyncope, syncope, or other concerns. He has been previously noncompliant with office visits due to finances but states that he is compliant with medication.    Current Medications (verified): 1)  Aspirin 325 Mg Tabs (Aspirin) .Marland Kitchen.. 1 Tab Daily 2)  Metoprolol Tartrate 25 Mg Tabs (Metoprolol Tartrate) .... Take 1 Tablet By Mouth Two Times A Day 3)  Simvastatin 40 Mg Tabs (Simvastatin) .... Take 1/2 Tablet Daily(20mg ) 4)  Alprazolam 1 Mg Tabs (Alprazolam) .... As Needed 5)  Percocet 5-325 Mg Tabs (Oxycodone-Acetaminophen) .Marland Kitchen.. 1 Tablet Every 6 Hours As Needed For Pain 6)  Flexeril 10 Mg Tabs (Cyclobenzaprine Hcl) .... As Needed 7)  Pacerone 200 Mg Tabs (Amiodarone Hcl) .... Take 1 Tablet By Mouth Once Daily 8)  Cheratussin Ac 100-10 Mg/1ml Syrp (Guaifenesin-Codeine) .... Take 5 Cc By Mouth Q 4hours As Needed For Cough 9)  Flonase 50 Mcg/act Susp (Fluticasone Propionate) .... One Spray Each Nostril Daily  Allergies: 1)  ! * Oxycontin  Past History:  Past Medical History: CAD s/p AMI 10/09 and 6/10 S/p PCI proximal LAD 10/09 with PTCA of ISR 6/10 Single vessel CABG 1/11   a.  cath 02/2009:  CFX and RCA ok with R-L collats. to occluded LAD   b.  placed on amiodarone post op for afib Ischemic CM (EF 30%) NYHA Class III CHF HYPERCHOLESTEROLEMIA  (ICD-272.0) LOW BACK PAIN  ELBOW PAIN, RIGHT  TOBACCO ABUSE  Nonsustained ventricular tachycardia  Past Surgical History: Reviewed history from 05/16/2009 and no changes required. S/P CABG 03/2009 due to reocclusion of LAD ICD placement 0ct. 2010  LAD stenting 10/09 and re-opening 6/10  Repeat catheterization 6/10 after PTCA:showed his EF was around 30-35% with anterior  akinesis.  He had 30% in-stent restenosis of the proximal LAD lesion,  30% proximal lesion in the second diagonal and a 40% proximal lesion in  the LAD at the bifurcation of the second diagonal.  There did not appear  to be obstructive disease to the point where he required any more  vascularization such as bypass surgery.  Social History: Reviewed history from 05/16/2009 and no changes required. Pt lives with his mom, brother, and 69 yo son in [place]. Quit in Jan. 2011 prior to heart surgery (prior -1/2 PPD (down from 1 ppd per day, onset age 75)  alcohol abuse- prior, hasn't drank since age 14 no illicit drug use  Currently unemployed, awaiting disability Single  Review of Systems       All systems are reviewed and negative except as listed in the HPI.   Vital Signs:  Patient profile:   35 year old male Height:      74 inches Weight:      275 pounds BMI:     35.44 Pulse rate:   82 / minute BP sitting:   114 /  80  (left arm)  Vitals Entered By: Laurance Flatten CMA (March 22, 2010 2:09 PM)  Physical Exam  General:  Well developed, well nourished, in no acute distress. Head:  normocephalic and atraumatic Eyes:  PERRLA/EOM intact; conjunctiva and lids normal. Mouth:  Teeth, gums and palate normal. Oral mucosa normal. Neck:  Neck supple, no JVD. No masses, thyromegaly or abnormal cervical nodes. Chest Wall:  well-healed  midline scar.without signs of infection. ICD pocket is well healed Lungs:  Clear bilaterally to auscultation and percussion. Heart:  Non-displaced PMI, chest non-tender; regular  rate and rhythm, S1, S2 without murmurs, rubs or gallops. Carotid upstroke normal, no bruit. Normal abdominal aortic size, no bruits. Femorals normal pulses, no bruits. Pedals normal pulses. No edema, no varicosities. Abdomen:  Bowel sounds positive; abdomen soft and non-tender without masses, organomegaly, or hernias noted. No hepatosplenomegaly. Msk:  Back normal, normal gait. Muscle strength and tone normal. Extremities:  No clubbing or cyanosis. Neurologic:  Alert and oriented x 3.     Nuclear Study  Procedure date:  03/14/2010  Findings:      Overall Impression   Exercise Capacity: Lexiscan with no exercise. BP Response: Normal blood pressure response. Clinical Symptoms: No chest pain ECG Impression: No significant ST segment change suggestive of ischemia. Overall Impression: Abnormal stress nuclear study. Overall Impression Comments: There is no evidence of ischemia.  There is a large anterior apical scar consistent with a previous MI.  The LV is markedly dilated and the LV function is markedly depressed.  Echocardiogram  Procedure date:  03/14/2010  Findings:      - Left ventricle: The cavity size was normal. Wall thickness was     increased in a pattern of mild LVH. Systolic function was     moderately to severely reduced. The estimated ejection fraction     was in the range of 30% to 35%. Hypokinesis of the entire     myocardium. Akinesis of the anteroseptal and apical myocardium.     Doppler parameters are consistent with abnormal left ventricular     relaxation (grade 1 diastolic dysfunction).   - Atrial septum: No defect or patent foramen ovale was identified.   - Pericardium, extracardiac: A trivial pericardial effusion was     identified.  CXR  Procedure date:  03/07/2010  Findings:       No active infiltrate or effusion is seen.  Mild   peribronchial thickening is noted.  Mild cardiomegaly is stable.  A   permanent pacemaker remains with AICD lead present.   Median   sternotomy sutures are intact.  No bony abnormality is seen.    IMPRESSION:   Stable cardiomegaly with permanent pacemaker and AICD lead.  No   active lung disease.      ICD Specifications Following MD:  Hillis Range, MD     Referring MD:  [facility] ICD Vendor:  [employer]     ICD Model Number:  E110     ICD Serial Number:  [id number] ICD DOI:  01/02/2009     ICD Implanting MD:  Hillis Range, MD  Lead 1:    Location: RA     DOI: 01/02/2009     Model #: [id number]     Serial #: [id number]     Status: active Lead 2:    Location: RV     DOI: 01/02/2009     Model #: [id number]     Serial #: [id number]     Status: active  Indications::  ICM   ICD Follow Up Remote Check?  No Battery Voltage:  good V     Charge Time:  8.6 seconds     Battery Est. Longevity:  10.5 years Underlying rhythm:  SR ICD Dependent:  No       ICD Device Measurements Atrium:  Amplitude: 6.1 mV, Impedance: 599 ohms, Threshold: 0.3 V at 0.4 msec Right Ventricle:  Amplitude: 25 mV, Impedance: 517 ohms, Threshold: 1.3 V at 0.4 msec Shock Impedance: 68 ohms   Episodes MS Episodes:  0     Percent Mode Switch:  0     Coumadin:  No Shock:  0     ATP:  0     Nonsustained:  1     ICD Appropriate Therapy?  Yes Atrial Pacing:  <1%     Ventricular Pacing:  <1%  Brady Parameters Mode DDI     Lower Rate Limit:  40     PAV 350      Tachy Zones VF:  200     VT:  170     Next Cardiology Appt Due:  05/30/2010 Tech Comments:  No parameter changes.  Device function normal.  ROV 3 months with Dr. Johney Frame @ which time we will start Lat[employer] transmissions. Altha Harm, LPN  March 22, 2010 2:43 PM  MD Comments:  agree,  nonsustained VT noted  Impression & Recommendations:  Problem # 1:  DYSPNEA (ICD-786.05) Unclear etiology, but likely due to CHF. I will start lasix 40mg  daily today. We will stop amiodarone as he has had no further afib.  Recent CXR was reviewed which did not suggest acute airspace disease.  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  Recent echo reviewed. He is not on an ace inhibitor.  We will therefore start lisinopril 5mg  daily today which can be titrated. Lasix 40mg  daily.  Problem # 3:  CAD (ICD-414.00) The patient has chest pain which he attributes to "indigestion" but also reports chest pain similar to previous angina. I am concerned that he may have progressive CAD.  I will start imdur today.  We will also start nexium for GERD. I have reviewed his recent myoview which reveals a large LAD territory scar but no active ischemia.  Nevertheless, if his chest pain cannot be controlled with medical therapy, we may have to consider repeat cath in the near future.  Problem # 4:  TOBACCO ABUSE (ICD-305.1) smoking cessation advised  Problem # 5:  HYPERCHOLESTEROLEMIA (ICD-272.0) stable His updated medication list for this problem includes:    Simvastatin 40 Mg Tabs (Simvastatin) .Marland Kitchen... 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 If 3 or more doses are required, call 911 and proceed to the ER immediately. 9)  Go to ER if you get worse Prescriptions: NITROSTAT 0.4 MG  SUBL (NITROGLYCERIN) use as directed  #25 x prn   Entered by:   Dennis Bast, RN, BSN   Authorized by:   Hillis Range, MD   Signed by:   Dennis Bast,  RN, BSN on 03/22/2010   Method used:   Electronically to        [facility]  [phone]* (retail)       [address]       [place], [place]  [address]       Ph: [phone] or [phone]       Fax: [phone]   RxID:   [id number] NEXIUM 40 MG CPDR (ESOMEPRAZOLE MAGNESIUM) one by mouth daily  #30 x 11   Entered by:   Dennis Bast, RN, BSN   Authorized by:   Hillis Range, MD   Signed by:   Dennis Bast, RN, BSN on 03/22/2010   Method used:   Electronically to        [facility]  [phone]* (retail)       [address]       [place], [place]  [address]       Ph: [phone] or [phone]       Fax: [phone]   RxID:   [id number] ISOSORBIDE MONONITRATE CR 30 MG XR24H-TAB (ISOSORBIDE MONONITRATE) Take one tablet by mouth daily  #30 x 11   Entered by:   Dennis Bast, RN, BSN   Authorized by:   Hillis Range, MD   Signed by:   Dennis Bast, RN, BSN on 03/22/2010   Method used:   Electronically to        [facility]  [phone]* (retail)       [address]       [place], [place]  [address]       Ph: [phone] or [phone]       Fax: [phone]   RxID:   [id number] LISINOPRIL 10 MG TABS (LISINOPRIL) Take 1/2 tablet by mouth daily  #30 x 6   Entered by:   Dennis Bast, RN, BSN   Authorized by:   Hillis Range, MD   Signed by:   Dennis Bast, RN, BSN on 03/22/2010   Method used:   Electronically to        [facility]  [phone]* (retail)       [address]       [place], [place]  [address]       Ph: [phone] or [phone]       Fax: [phone]   RxID:   [id number] FUROSEMIDE 40 MG TABS (FUROSEMIDE) Take one tablet by mouth daily.  #30 x 11   Entered by:   Dennis Bast, RN, BSN   Authorized by:   Hillis Range, MD   Signed by:   Dennis Bast, RN, BSN on 03/22/2010   Method used:   Electronically to         [facility]  [phone]* (retail)       [address]       [place], [place]  [address]       Ph: [phone] or [phone]       Fax: [phone]   RxID:   [id number]

## 2010-05-02 NOTE — Progress Notes (Signed)
Summary: nuc pre-procedure  Phone Note Outgoing Call   Call placed by: Domenic Polite, CNMT,  March 13, 2010 3:52 PM Call placed to: Patient Reason for Call: Confirm/change Appt Summary of Call: Left message with information on Myoview Information Sheet (see scanned document for details).       Nuclear Med Background Indications for Stress Test: Evaluation for Ischemia, Graft Patency, Stent Patency, PTCA Patency   History: Angioplasty, CABG, Defibrillator, Echo, Heart Catheterization, Myocardial Infarction, Myocardial Perfusion Study  History Comments: 10/09 MI. ; 6/10 MI / echo EF=30-35% / angioplasty LAD ; 10/10 defibrillator / ICM ; 12/10 MPI  fixed ant defect   Symptoms: Chest Pain, DOE    Nuclear Pre-Procedure Cardiac Risk Factors: Family History - CAD, Lipids, Smoker Height (in): 74

## 2010-05-02 NOTE — Assessment & Plan Note (Signed)
Summary: Cardiology Nuclear Testing  Nuclear Med Background Indications for Stress Test: Evaluation for Ischemia, Graft Patency, Stent Patency, PTCA Patency   History: Angioplasty, CABG, Defibrillator, Echo, Heart Catheterization, Myocardial Infarction, Myocardial Perfusion Study  History Comments:  6/10 MI>PTCA-LAD; 6/10 Echo:EF=30-35%; 10/10 AICD; 12/10 VOH:YWVPX anterior defect with viability in inferior septum>Cath; 1/11 CABG   Symptoms: Chest Pain, Dizziness, DOE, Fatigue, Near Syncope, SOB  Symptoms Comments: c/o chest numbness since CABG 1/11.   Nuclear Pre-Procedure Cardiac Risk Factors: Family History - CAD, Lipids, Obesity, Smoker Caffeine/Decaff Intake: None NPO After: 5:00 PM Lungs: Clear.  O2 Sat 95% on RA. IV 0.9% NS with Angio Cath: 22g     IV Site: R Antecubital IV Started by: Bonnita Levan, RN Chest Size (in) 52     Height (in): 74 Weight (lb): 272 BMI: 35.05  Nuclear Med Study 1 or 2 day study:  1 day     Stress Test Type:  Eugenie Birks Reading MD:  Kristeen Miss, MD     Referring MD:  Hillis Range, MD Resting Radionuclide:  Technetium 52m Tetrofosmin     Resting Radionuclide Dose:  11.0 mCi  Stress Radionuclide:  Technetium 32m Tetrofosmin     Stress Radionuclide Dose:  33.0 mCi   Stress Protocol   Lexiscan: 0.4 mg   Stress Test Technologist:  Rea [employer], CMA-N     Nuclear Technologist:  Domenic Polite, CNMT  Rest Procedure  Myocardial perfusion imaging was performed at rest 45 minutes following the intravenous administration of Technetium 2m Tetrofosmin.  Stress Procedure  The patient received IV Lexiscan 0.4 mg over 15-seconds.  Technetium 36m Tetrofosmin injected at 30-seconds.  There were no significant changes with infusion.  Quantitative spect images were obtained after a 45 minute delay.  QPS Raw Data Images:  Normal; no motion artifact; normal heart/lung ratio. Stress Images:  The LV is markedly dilated.  There is a large severe defect in the  anterior apical region with fairly normal uptake in the inferior wal Rest Images:  The LV is markedly dilated.  There is a large severe defect in the anterior apical region with fairly normal uptake in the inferior wal Subtraction (SDS):  No evidence of ischemia. Transient Ischemic Dilatation:  1.09  (Normal <1.22)  Lung/Heart Ratio:  0.40  (Normal <0.45)  Quantitative Gated Spect Images QGS EDV:  248 ml QGS ESV:  194 ml QGS EF:  22 % QGS cine images:  The LV is markedly dilated.  There is global LV dysfunction with dyskinesis of the apex and hypokinesis/akinesis of the anterior wall  Findings High risk nuclear study      Overall Impression  Exercise Capacity: Lexiscan with no exercise. BP Response: Normal blood pressure response. Clinical Symptoms: No chest pain ECG Impression: No significant ST segment change suggestive of ischemia. Overall Impression: Abnormal stress nuclear study. Overall Impression Comments: There is no evidence of ischemia.  There is a large anterior apical scar consistent with a previous MI.  The LV is markedly dilated and the LV function is markedly depressed.  Appended Document: Cardiology Nuclear Testing EF on echo unchanged findings fairly c/w prior MI and wall motion abnormality on echo f/u with Dr. Johney Frame this Friday as planned to further review

## 2010-05-02 NOTE — Letter (Signed)
Summary: Results Follow-up  [facility], Main Office  [address]   [place], [place] [address]   Phone: [phone]  Fax: [phone]     March 11, 2010 MRN: [id number]   John Davenport [address] [place], [place]  [address]   Dear Mr. AUGHENBAUGH,  We have received the results from your recent tests and have been unable to contact you.  Please call our office at the number listed above so that Dr.  Johney Frame  or his nurse may review the results with you.    Thank you,  [facility]

## 2010-05-02 NOTE — Cardiovascular Report (Signed)
Summary: Office Visit   Office Visit   Imported By: Roderic Ovens 03/12/2010 09:10:20  _____________________________________________________________________  External Attachment:    Type:   Image     Comment:   External Document

## 2010-05-02 NOTE — Procedures (Signed)
Summary: device check has appt. with scott at 11am/sl   Current Medications (verified): 1)  Aspirin 325 Mg Tabs (Aspirin) .Marland Kitchen.. 1 Tab Daily 2)  Metoprolol Tartrate 25 Mg Tabs (Metoprolol Tartrate) .... Take 1 Tablet By Mouth Two Times A Day 3)  Simvastatin 40 Mg Tabs (Simvastatin) .Marland Kitchen.. 1 Tab Daily 4)  Xanax 0.5 Mg Tabs (Alprazolam) .... Take One Tablet By Mouth Qid As Needed 5)  Percocet 5-325 Mg Tabs (Oxycodone-Acetaminophen) .Marland Kitchen.. 1 Tablet Every 6 Hours As Needed For Pain 6)  Flexeril 10 Mg Tabs (Cyclobenzaprine Hcl) .... As Needed 7)  Pacerone 200 Mg Tabs (Amiodarone Hcl) .... Take 1 Tablet By Mouth Two Times A Day 8)  Aspir-Trin 325 Mg Tbec (Aspirin) .... Take 1 Tablet By Mouth Once A Day 9)  Paxil 20 Mg Tabs (Paroxetine Hcl) .... Take 1 Tablet Daily 10)  Biaxin 500 Mg Tabs (Clarithromycin) .... Take One Table By Mouth Two Times A Day For 10 Days 11)  Cheratussin Ac 100-10 Mg/52ml Syrp (Guaifenesin-Codeine) .... Take 5 Cc By Mouth Q 4hours As Needed For Cough 12)  Flonase 50 Mcg/act Susp (Fluticasone Propionate) .... One Spray Each Nostril Daily  Allergies (verified): 1)  ! * Oxycontin   ICD Specifications Following MD:  Hillis Range, MD     Referring MD:  [facility] ICD Vendor:  [employer]     ICD Model Number:  E110     ICD Serial Number:  [id number] ICD DOI:  01/02/2009     ICD Implanting MD:  Hillis Range, MD  Lead 1:    Location: RA     DOI: 01/02/2009     Model #: [id number]     Serial #: [id number]     Status: active Lead 2:    Location: RV     DOI: 01/02/2009     Model #: [id number]     Serial #: [id number]     Status: active  Indications::  ICM   ICD Follow Up Battery Voltage:  GOOD V     Charge Time:  8.6 seconds     Battery Est. Longevity:  8.5 YRS Underlying rhythm:  SR ICD Dependent:  No       ICD Device Measurements Atrium:  Amplitude: 9.4 mV, Impedance: 582 ohms, Threshold: 0.8 V at 0.4 msec Right Ventricle:  Amplitude: 23.1 mV, Impedance: 500 ohms, Threshold: 1.5 V at 0.4  msec Shock Impedance: 68 ohms   Episodes MS Episodes:  0     Shock:  0     ATP:  0     Nonsustained:  1     Atrial Pacing:  <1%     Ventricular Pacing:  <1%  Brady Parameters Mode DDI     Lower Rate Limit:  40     PAV 350      Tachy Zones VF:  200     VT:  170     Next Cardiology Appt Due:  03/22/2010 Tech Comments:  1 VT EPISODE LASTING 16 SECONDS W/NO THERAPY.  NORMAL DEVICE FUNCTION.  NO CHANGES MADE. ROV 03-22-10 @ 1400 W/JA. Vella Kohler  March 07, 2010 8:38 AM

## 2010-05-02 NOTE — Letter (Signed)
Summary: Device-Delinquent Check  [facility], Main Office  [address]   [place], [place] [address]   Phone: [phone]  Fax: [phone]     November 06, 2009 MRN: [id number]   John Davenport [address] [place], [place]  [address]   Dear Mr. AUST,  According to our records, you have not had your implanted device checked in the recommended period of time.  We are unable to determine appropriate device function without checking your device on a regular basis.  Please call our office to schedule an appointment, with the Device Clinic,  as soon as possible.  If you are having your device checked by another physician, please call us so that we may update our records.  Thank you,  Altha Harm, LPN  November 06, 2009 11:01 AM  [facility]

## 2010-05-02 NOTE — Assessment & Plan Note (Signed)
Summary: c/o sob unable to tie his shoes without getting winded/cabg j...   Visit Type:  Follow-up Referring Provider:  Corliss Marcus, MD Primary Provider:  [place] out pt  CC:  Chest pain / SOB.  History of Present Illness: Primary Electrophysiologist:  Dr. Hillis Range  John Davenport is a 35 yo male with a h/o 1v CAD s/p BMS to LAD 12/2007 with subsequent POBA to LAD stent in 08/2008.  He eventually required single vessel CABG in 03/2009 due to restenosis.  He also has an Ischemic CM with EF 30-35% by last echo in 08/2008.  He is on Amio due to post-op AFib.  He is s/p AICD.    He is a prior patient of Dr. Ty Hilts.  Dr. Amil Amen is moving away and the patient has been told he needs to find another cardiologist.  He presents today with complaints of dyspnea.  He states that he has had these symptoms before when he required repeat intervention on his LAD.  His breathing improved after his bypass surgery in January.  However, over the last 4 months, he's had increased dyspnea with exertion.  He notes New York Heart Association class III symptoms.  He has had continued chest wall pain since his surgery.  He is requesting pain medications today.  He has to sleep on 4-6 pillows a night due to the chest wall pain.  He denies any paroxysmal nocturnal dyspnea.  He denies lower extremity edema.  He denies syncope.  His ICD has not fired.  He denies palpitations.  Current Medications (verified): 1)  Aspirin 325 Mg Tabs (Aspirin) .Marland Kitchen.. 1 Tab Daily 2)  Metoprolol Tartrate 25 Mg Tabs (Metoprolol Tartrate) .... Take 1 Tablet By Mouth Two Times A Day 3)  Simvastatin 40 Mg Tabs (Simvastatin) .Marland Kitchen.. 1 Tab Daily 4)  Xanax 0.5 Mg Tabs (Alprazolam) .... Take One Tablet By Mouth Qid As Needed 5)  Percocet 5-325 Mg Tabs (Oxycodone-Acetaminophen) .Marland Kitchen.. 1 Tablet Every 6 Hours As Needed For Pain 6)  Flexeril 10 Mg Tabs (Cyclobenzaprine Hcl) .... As Needed 7)  Pacerone 200 Mg Tabs (Amiodarone Hcl) .... Take 1 Tablet By  Mouth Two Times A Day 8)  Aspir-Trin 325 Mg Tbec (Aspirin) .... Take 1 Tablet By Mouth Once A Day 9)  Paxil 20 Mg Tabs (Paroxetine Hcl) .... Take 1 Tablet Daily 10)  Biaxin 500 Mg Tabs (Clarithromycin) .... Take One Table By Mouth Two Times A Day For 10 Days 11)  Cheratussin Ac 100-10 Mg/68ml Syrp (Guaifenesin-Codeine) .... Take 5 Cc By Mouth Q 4hours As Needed For Cough 12)  Flonase 50 Mcg/act Susp (Fluticasone Propionate) .... One Spray Each Nostril Daily  Allergies (verified): 1)  ! * Oxycontin  Past History:  Past Medical History: CAD s/p AMI 10/09 and 6/10 S/p PCI proximal LAD 10/09 with PTCA of ISR 6/10 Single vessel CABG 1/11   a.  cath 02/2009:  CFX and RCA ok with R-L collats. to occluded LAD Ischemic CM (EF 30%) NYHA Class III CHF HYPERCHOLESTEROLEMIA (ICD-272.0) LOW BACK PAIN  ELBOW PAIN, RIGHT  TOBACCO ABUSE   Past Surgical History: Reviewed history from 05/16/2009 and no changes required. S/P CABG 03/2009 due to reocclusion of LAD ICD placement 0ct. 2010  LAD stenting 10/09 and re-opening 6/10  Repeat catheterization 6/10 after PTCA:showed his EF was around 30-35% with anterior  akinesis.  He had 30% in-stent restenosis of the proximal LAD lesion,  30% proximal lesion in the second diagonal and a 40% proximal lesion in  the LAD at the bifurcation of the second diagonal.  There did not appear  to be obstructive disease to the point where he required any more  vascularization such as bypass surgery.  Social History: Reviewed history from 05/16/2009 and no changes required. Pt lives with his mom, brother, and 61 yo son in [place]. Quit in Jan. 2011 prior to heart surgery (prior -1/2 PPD (down from 1 ppd per day, onset age 27)  alcohol abuse- prior, hasn't drank since age 32 no illicit drug use  Currently unemployed, awaiting disability Single  Review of Systems       He has occ. blood on the tissue with BMs.  He notes significant fatigue and daytime  hypersomnolence.  He was seen by his PCP today for exudative pharyngitis.  Otherwise, as per  the HPI.  All other systems reviewed and negative.   Vital Signs:  Patient profile:   35 year old male Height:      74 inches Weight:      279 pounds BMI:     35.95 Pulse rate:   86 / minute BP sitting:   132 / 80  (left arm) Cuff size:   regular  Vitals Entered By: Dessie Coma  LPN (March 06, 2010 11:18 AM)  Physical Exam  General:  Well nourished, well developed, in no acute distress HEENT: normal Neck: no JVD or HJR Cardiac:  normal S1, S2; RRR; no murmur; no gallops Chest: incision well healed without obvious deformity Lungs:  clear to auscultation bilaterally, no wheezing, rhonchi or rales Abd: soft, nontender, no hepatomegaly Ext: no edema Vascular: no carotid  bruits Skin: warm and dry Neuro:  CNs 2-12 intact, no focal abnormalities noted    EKG  Procedure date:  03/06/2010  Findings:      Normal Sinus Rhythm Heart rate 86 Normal axis Q waves in leads V1 and V2 with J-point elevation No significant change since tracing dated 11/19/99   ICD Specifications Following MD:  Hillis Range, MD     Referring MD:  [facility] ICD Vendor:  [facility] [employer]     ICD Model Number:  E110     ICD Serial Number:  [id number] ICD DOI:  01/02/2009     ICD Implanting MD:  Hillis Range, MD  Lead 1:    Location: RA     DOI: 01/02/2009     Model #: [id number]     Serial #: [id number]     Status: active Lead 2:    Location: RV     DOI: 01/02/2009     Model #: [id number]     Serial #: [id number]     Status: active  Indications::  ICM   ICD Follow Up ICD Dependent:  No      Huston Foley Parameters Mode DDI     Lower Rate Limit:  40     PAV 350      Tachy Zones VF:  200     VT:  170     Impression & Recommendations:  Problem # 1:  DYSPNEA (ICD-786.05)  Etiology is not entirely clear.  His symptoms are reminiscent of his previous symptoms prior to angioplasty and bypass surgery.  However, he is only 10-1/2  months out since his surgery.  He had no potential for ischemia from his circumflex or RCA at the time of his cath in 02/2009.  There is a possibility that he may have developed graft disease contributing to his symptoms, although this seems unlikely.  Other possibilities include worsening ischemic cardiomyopathy, CHF, COPD or sleep apnea.  At this time I will set him up for a stress Myoview study to rule out the possibility of ischemia.  I'll also set him up for an echocardiogram to reassess his LV function and rule out worsening ejection fraction.  He does not appear volume overloaded today.  I will obtain a BNP and a chest x-ray.  I will also obtain a CBC, basic metabolic panel and TSH.  If the above is unremarkable, he should be set up for sleep study as he has symptoms suggestive of OSA.  Further consideration for PFTs can also be made at that time.  He will be brought back in close followup with Dr. Johney Frame.  Of note, he was previously followed by Dr. Amil Amen.  Dr. Amil Amen is moving and the patient prefers to followup with Dr. Johney Frame.  Orders: Echocardiogram (Echo) EKG w/ Interpretation (93000) TLB-CBC Platelet - w/Differential (85025-CBCD) TLB-BNP (B-Natriuretic Peptide) (83880-BNPR) TLB-BMP (Basic Metabolic Panel-BMET) (80048-METABOL) TLB-TSH (Thyroid Stimulating Hormone) (84443-TSH) T-2 View CXR (71020TC) Nuclear Stress Test (Nuc Stress Test)  Problem # 2:  CHEST PAIN (ICD-786.50)  He has continued chest wall pain since his surgery.  I will refill Percocet for him today. If his cardiac workup is unremarkable, he may need to be set up for physical therapy.  Orders: EKG w/ Interpretation (93000)  Problem # 3:  CAD (ICD-414.00)  As above, a myoview will be done and follow up with Dr. Johney Frame.  Problem # 4:  ISCHEMIC CARDIOMYOPATHY (ICD-414.8)  He is on metoprolol only. I would suggest we try to advance his medical therapy by changing to coreg and adding an ACE inhibitor once the  above workup is completed.  Problem # 5:  HYPERCHOLESTEROLEMIA (ICD-272.0) He is on Amio. Will reduce his dose of Simva to 20 mg and repeat FLP and LFTs in 8 weeks.  His updated medication list for this problem includes:    Simvastatin 40 Mg Tabs (Simvastatin) .Marland Kitchen... Take 1/2 tablet daily(20mg )  Patient Instructions: 1)  Your physician recommends that you schedule a follow-up appointment in: WITH DR. ALLRED IN 2 WEEKS 2)  Your physician recommends that you return for lab work EA:VWUJW FOR BMET,CBC,TSH,BNP FOR DYSPNEA 3)  Your physician has recommended you make the following change in your medication: DECREASE SIMVASTATIN TO 20MG  INSTEAD OF 40MG  4)  A chest x-ray takes a picture of the organs and structures inside the chest, including the heart, lungs, and blood vessels. This test can show several things, including, whether the heart is enlarged; whether fluid is building up in the lungs; and whether pacemaker / defibrillator leads are still in place. 5)  Your physician has requested that you have an echocardiogram. TO HAVE THIS WITHIN 1 WEEK Echocardiography is a painless test that uses sound waves to create images of your heart. It provides your doctor with information about the size and shape of your heart and how well your heart's chambers and valves are working.  This procedure takes approximately one hour. There are no restrictions for this procedure. 6)  Your physician has requested that you have an exercise stress myoview. THIS IS TO BE WITHIN 1 WEEK For further information please visit [email or web address]  Please follow instruction sheet, as given.   Prescriptions: PERCOCET 5-325 MG TABS (OXYCODONE-ACETAMINOPHEN) 1 tablet every 6 hours as needed for pain  #30 x 0   Entered and Authorized by:   Tereso Newcomer PA-C   Signed by:   Lorin Picket  Weaver PA-C on 03/06/2010   Method used:   Print then Give to Patient   RxID:   [id number]

## 2010-05-02 NOTE — Assessment & Plan Note (Signed)
Summary: F/U/EST/VS   Vital Signs:  Patient profile:   35 year old male Height:      74 inches (187.96 cm) Weight:      261.1 pounds (118.68 kg) BMI:     33.64 Temp:     96.7 degrees F oral Pulse rate:   55 / minute BP sitting:   106 / 72  (left arm)  Vitals Entered By: Chinita Pester RN (June 13, 2009 10:18 AM) CC: Medication refills. Had open heart surg. in Jan. Pain Assessment Patient in pain? yes     Location: chest Intensity: 7 Type: aching Onset of pain  Constant;worse at night. Nutritional Status BMI of > 30 = obese  Does patient need assistance? Functional Status Self care Ambulation Normal   Primary Care Provider:  [facility] out pt  CC:  Medication refills. Had open heart surg. in Jan..  History of Present Illness: John Davenport is a 35 yo M with PMH of MI s/p mult stents and CABG Jan '11 and ischemic cardiomyopathy (EF = 30%, s/p ICD placement) who presents for med refills. He was last seen in clinic by Dr. Baltazar Apo on 05/16/09 for follow-up after his surgery. He obtained refills of his percocet and xanax at that time. He still c/o significant pain at the lower half of his scar site that hurts worst when he walks around and feels like "someone is pulling my chest apart." He said the percocet is the only thing that helps the pain. He also reports numbness of his L chest wall and side (he believes this is where they removed the vein) and allodynia when he puts on his shirt. He is also feeling anxious, which he attributes to having been out of work for so long, and says the Xanax he takes twice daily have been helping.   Depression History:      The patient denies a depressed mood most of the day and a diminished interest in his usual daily activities.         Preventive Screening-Counseling & Management  Alcohol-Tobacco     Alcohol drinks/day: 0     Smoking Status: quit < 6 months     Smoking Cessation Counseling: yes     Packs/Day: 1/2  ppd  Caffeine-Diet-Exercise     Does Patient Exercise: no  Current Medications (verified): 1)  Aspirin 325 Mg Tabs (Aspirin) .Marland Kitchen.. 1 Tab Daily 2)  Metoprolol Tartrate 25 Mg Tabs (Metoprolol Tartrate) .... Take 1 Tablet By Mouth Two Times A Day 3)  Simvastatin 40 Mg Tabs (Simvastatin) .Marland Kitchen.. 1 Tab Daily 4)  Clonazepam 1 Mg Tabs (Clonazepam) .... Take 1 Tablet Twice Daily As Needed For Anxiety 5)  Percocet 5-325 Mg Tabs (Oxycodone-Acetaminophen) .Marland Kitchen.. 1 Tablet Every 6 Hours As Needed For Pain 6)  Flexeril 10 Mg Tabs (Cyclobenzaprine Hcl) .... As Needed 7)  Pacerone 200 Mg Tabs (Amiodarone Hcl) .... Take 1 Tablet By Mouth Two Times A Day 8)  Aspir-Trin 325 Mg Tbec (Aspirin) .... Take 1 Tablet By Mouth Once A Day 9)  Paxil 20 Mg Tabs (Paroxetine Hcl) .... Take 1 Tablet Daily  Allergies (verified): 1)  ! * Oxycontin  Social History: Does Patient Exercise:  no  Review of Systems      See HPI  Physical Exam  General:  Well-developed,well-nourished,in no acute distress; alert,appropriate and cooperative throughout examination Head:  Normocephalic and atraumatic without obvious abnormalities. No apparent alopecia or balding. Chest Wall:  well-healing, thick midline scar. Dark pink in  color but no signs of infection. Lungs:  Normal respiratory effort, chest expands symmetrically. Lungs are clear to auscultation, no crackles or wheezes. Heart:  Normal rate and regular rhythm. S1 and S2 normal without gallop, murmur, click, rub or other extra sounds. Neurologic:  alert & oriented X3.   Skin:  see exam under "chest wall" Psych:  Cognition and judgment appear intact. Alert and cooperative with normal attention span and concentration. No apparent delusions, illusions, hallucinations   Impression & Recommendations:  Problem # 1:  CAD (ICD-414.00) Pt is s/p CABG in Jan '11 and still c/o significant scar pain, as well as numbness and allodynia of the L chest wall and side where he said the vein was  harvested. I spoke about this issue with Dr. Sampson Goon and we will defer his pain management to his surgeon, with whom he has an appointment in 2 wks. I have written for enough Percocet to last him until that appt. Will defer the rest of his cardiac management to his cardiologist.  His updated medication list for this problem includes:    Aspirin 325 Mg Tabs (Aspirin) .Marland Kitchen... 1 tab daily    Metoprolol Tartrate 25 Mg Tabs (Metoprolol tartrate) .Marland Kitchen... Take 1 tablet by mouth two times a day    Pacerone 200 Mg Tabs (Amiodarone hcl) .Marland Kitchen... Take 1 tablet by mouth two times a day    Aspir-trin 325 Mg Tbec (Aspirin) .Marland Kitchen... Take 1 tablet by mouth once a day  Problem # 2:  ANXIETY (ICD-300.00) He has been on Xanax two times a day x 1 year for his anxiety, which he believes is 2/2 to him not having worked in 2 years (he worked previously as a Curator). Xanax is not the optimum treatment for anxiety in his case as he does not have panic attacks. He also denies being depressed. I will switch him to Klonopin, which is longer-acting, as well as start him on Paxil. I am hopeful that in 1 month, he can be off benzos completely and can be just on Paxil. He will be reevaluated in 2 months.  His updated medication list for this problem includes:    Clonazepam 1 Mg Tabs (Clonazepam) .Marland Kitchen... Take 1 tablet twice daily as needed for anxiety    Paxil 20 Mg Tabs (Paroxetine hcl) .Marland Kitchen... Take 1 tablet daily  Problem # 3:  TOBACCO ABUSE (ICD-305.1) Pt quit after he had to get CABG.  Problem # 4:  ISCHEMIC CARDIOMYOPATHY (ICD-414.8) Pt has ICD which is being managed by Dr. Johney Frame with [facility]. Will defer management to him.  His updated medication list for this problem includes:    Aspirin 325 Mg Tabs (Aspirin) .Marland Kitchen... 1 tab daily    Metoprolol Tartrate 25 Mg Tabs (Metoprolol tartrate) .Marland Kitchen... Take 1 tablet by mouth two times a day    Pacerone 200 Mg Tabs (Amiodarone hcl) .Marland Kitchen... Take 1 tablet by mouth two times a day    Aspir-trin  325 Mg Tbec (Aspirin) .Marland Kitchen... Take 1 tablet by mouth once a day  Complete Medication List: 1)  Aspirin 325 Mg Tabs (Aspirin) .Marland Kitchen.. 1 tab daily 2)  Metoprolol Tartrate 25 Mg Tabs (Metoprolol tartrate) .... Take 1 tablet by mouth two times a day 3)  Simvastatin 40 Mg Tabs (Simvastatin) .Marland Kitchen.. 1 tab daily 4)  Clonazepam 1 Mg Tabs (Clonazepam) .... Take 1 tablet twice daily as needed for anxiety 5)  Percocet 5-325 Mg Tabs (Oxycodone-acetaminophen) .Marland Kitchen.. 1 tablet every 6 hours as needed for pain 6)  Flexeril  10 Mg Tabs (Cyclobenzaprine hcl) .... As needed 7)  Pacerone 200 Mg Tabs (Amiodarone hcl) .... Take 1 tablet by mouth two times a day 8)  Aspir-trin 325 Mg Tbec (Aspirin) .... Take 1 tablet by mouth once a day 9)  Paxil 20 Mg Tabs (Paroxetine hcl) .... Take 1 tablet daily  Patient Instructions: 1)  Please schedule a follow-up appointment in 2 months. 2)  I have changed your Xanax to Klonopin (clonazepam), which is longer-acting. Please stop taking the Xanax. 3)  I have also added Paxil, which is better for your anxiety than either Xanax or clonazepam. After one month of taking both Paxil and clonazepam, you should be able to just take Paxil. We can increase your dose at that time, if necessary. 4)  Please call the clinic with any questions or concerns.  Prescriptions: PAXIL 20 MG TABS (PAROXETINE HCL) take 1 tablet daily  #30 x 6   Entered and Authorized by:   [name] MD   Signed by:   [name] MD on 06/13/2009   Method used:   Print then Give to Patient   RxID:   [id number] PERCOCET 5-325 MG TABS (OXYCODONE-ACETAMINOPHEN) 1 tablet every 6 hours as needed for pain  #45 x 0   Entered and Authorized by:   [name] MD   Signed by:   [name] MD on 06/13/2009   Method used:   Print then Give to Patient   RxID:   [id number] PAXIL 20 MG TABS (PAROXETINE HCL) take 1 tablet daily  #30 x 0   Entered and Authorized by:   [name] MD   Signed by:   [name] MD on 06/13/2009   Method used:   Print then Give to Patient   RxID:   [id number] CLONAZEPAM 1 MG TABS (CLONAZEPAM) take 1 tablet twice daily as needed for anxiety  #60 x 0   Entered and Authorized by:   [name] MD   Signed by:   [name] MD on 06/13/2009   Method used:   Print then Give to Patient   RxID:   [id number]    Prevention & Chronic Care Immunizations   Influenza vaccine: Historical  (09/28/2008)   Influenza vaccine deferral: Refused  (05/16/2009)    Tetanus booster: Not documented    Pneumococcal vaccine: Not documented  Other Screening   Smoking status: quit < 6 months  (06/13/2009)  Lipids   Total Cholesterol: Not documented   LDL: Not documented   LDL Direct: Not documented   HDL: Not documented   Triglycerides: Not documented    SGOT (AST): Not documented   SGPT (ALT): Not documented   Alkaline phosphatase: Not documented   Total bilirubin: Not documented  Self-Management Support :    Lipid self-management support: Education handout, Resources for patients handout  (06/13/2009)     Lipid education handout printed      Resource handout printed.

## 2010-05-02 NOTE — Letter (Signed)
Summary: Appointment - Missed  [facility], Main Office  [address]   [place], [place] [address]   Phone: [phone]  Fax: [phone]     September 20, 2009 MRN: [id number]   John Davenport [address] [place], [place]  [address]   Dear [name],  Our records indicate you missed your appointment on 09/10/09 with Pac[facility]. It is very important that we reach you to reschedule this appointment. We look forward to participating in your health care needs. Please contact us at the number listed above at your earliest convenience to reschedule this appointment.     Sincerely,   Ruel Favors Scheduling Team

## 2010-05-02 NOTE — Assessment & Plan Note (Signed)
Summary: ACUTE-HEAD COLD/([facility])/CFB   Vital Signs:  Patient profile:   35 year old male Height:      74 inches (187.96 cm) Weight:      281.7 pounds (128.05 kg) BMI:     36.30 Temp:     97.3 degrees F oral Pulse rate:   97 / minute BP sitting:   131 / 85  (right arm) Cuff size:   large  Vitals Entered By: Chinita Pester RN (March 06, 2010 9:14 AM) CC: Started Friday - sore throat, watery eyes, dry cough, sweatig. Reill on Xanax. Is Patient Diabetic? No Pain Assessment Patient in pain? no      Nutritional Status BMI of > 30 = obese  Have you ever been in a relationship where you felt threatened, hurt or afraid?No   Does patient need assistance? Functional Status Self care Ambulation Normal   CC:  Started Friday - sore throat, watery eyes, dry cough, and sweatig. Reill on Xanax..  Depression History:      The patient denies a depressed mood most of the day and a diminished interest in his usual daily activities.         Preventive Screening-Counseling & Management  Alcohol-Tobacco     Alcohol drinks/day: 0     Smoking Status: quit > 6 months     Smoking Cessation Counseling: yes     Packs/Day: 1/2 ppd     Year Quit: 2011  Caffeine-Diet-Exercise     Does Patient Exercise: no  Current Problems (verified): 1)  Anxiety  (ICD-300.00) 2)  Tobacco Abuse  (ICD-305.1) 3)  Cad  (ICD-414.00) 4)  Myocardial Infarction  (ICD-410.90) 5)  Chest Pain  (ICD-786.50) 6)  Ischemic Cardiomyopathy  (ICD-414.8) 7)  Hypercholesterolemia  (ICD-272.0) 8)  Low Back Pain, Acute  (ICD-724.2) 9)  Elbow Pain, Right  (ICD-719.42) 10)  Tobacco Abuse  (ICD-305.1)  Current Medications (verified): 1)  Aspirin 325 Mg Tabs (Aspirin) .Marland Kitchen.. 1 Tab Daily 2)  Metoprolol Tartrate 25 Mg Tabs (Metoprolol Tartrate) .... Take 1 Tablet By Mouth Two Times A Day 3)  Simvastatin 40 Mg Tabs (Simvastatin) .Marland Kitchen.. 1 Tab Daily 4)  Clonazepam 1 Mg Tabs (Clonazepam) .... Take 1 Tablet Twice Daily As Needed For  Anxiety 5)  Percocet 5-325 Mg Tabs (Oxycodone-Acetaminophen) .Marland Kitchen.. 1 Tablet Every 6 Hours As Needed For Pain 6)  Flexeril 10 Mg Tabs (Cyclobenzaprine Hcl) .... As Needed 7)  Pacerone 200 Mg Tabs (Amiodarone Hcl) .... Take 1 Tablet By Mouth Two Times A Day 8)  Aspir-Trin 325 Mg Tbec (Aspirin) .... Take 1 Tablet By Mouth Once A Day 9)  Paxil 20 Mg Tabs (Paroxetine Hcl) .... Take 1 Tablet Daily  Allergies (verified): 1)  ! * Oxycontin  Past History:  Past Medical History: Last updated: 05/07/2009 CAD s/p AMI 10/09 and 6/10 S/p PCI proximal LAD 10/09 with PTCA of ISR 6/10 Single vessel CABG 1/11 Ischemic CM (EF 30%) NYHA Class III CHF HYPERCHOLESTEROLEMIA (ICD-272.0) LOW BACK PAIN  ELBOW PAIN, RIGHT  TOBACCO ABUSE   Past Surgical History: Last updated: 05/16/2009 S/P CABG 03/2009 due to reocclusion of LAD ICD placement 0ct. 2010  LAD stenting 10/09 and re-opening 6/10  Repeat catheterization 6/10 after PTCA:showed his EF was around 30-35% with anterior  akinesis.  He had 30% in-stent restenosis of the proximal LAD lesion,  30% proximal lesion in the second diagonal and a 40% proximal lesion in  the LAD at the bifurcation of the second diagonal.  There did not appear  to be obstructive disease to the point where he required any more  vascularization such as bypass surgery.  Family History: Last updated: 2009/01/19 Father- first MI at 54, died at 74 after mult MI Mother- denies DM, HTN, or CAD No other relatives with early onset CAD  Social History: Last updated: 05/16/2009 Pt lives with his mom, brother, and 89 yo son in [place]. Quit in Jan. 2011 prior to heart surgery (prior -1/2 PPD (down from 1 ppd per day, onset age 16)  alcohol abuse- prior, hasn't drank since age 66 no illicit drug use  Currently unemployed, awaiting disability Single  Risk Factors: Alcohol Use: 0 (03/06/2010) Exercise: no (03/06/2010)  Risk Factors: Smoking Status: quit > 6 months  (03/06/2010) Packs/Day: 1/2 ppd (03/06/2010)  Social History: Smoking Status:  quit > 6 months  Physical Exam  General:  Well-developed,well-nourished,in no acute distress; alert,appropriate and cooperative throughout examination Head:  no abnormalities observed.   Eyes:  vision grossly intact, pupils equal, pupils round, and pupils reactive to light.   Ears:  R ear normal.   Nose:  Erythematous turbinates with thick yellow nasal discharge present. NO sinus TTP bilaterally. Mouth:  fair dentition.   malordorous; opacified tongue; injected with white exudate to both tonsils; uvula midline; tonsils 3+/4 bilaterally. Neck:  supple, full ROM, no masses, and no JVD.   Chest Wall:  well-healed  midline scar.without signs of infection. Lungs:  Normal respiratory effort, chest expands symmetrically. Lungs are clear to auscultation, no crackles or wheezes. Heart:  Normal rate and regular rhythm. S1 and S2 normal without gallop, murmur, click, rub or other extra sounds. Abdomen:  soft, non-tender, normal bowel sounds, no distention, no masses, and no guarding.   Msk:  normal ROM, no joint tenderness, no joint swelling, and no joint warmth.   Pulses:  R radial normal and L radial normal.   Extremities:  no LE edema  Neurologic:  alert & oriented X3.   Skin:  see exam under "chest wall" Psych:  Cognition and judgment appear intact. Alert and cooperative with normal attention span and concentration. No apparent delusions, illusions, hallucinations   Impression & Recommendations:  Problem # 1:  EXUDATIVE PHARYNGITIS (ICD-462) Biaxin 500 mg by mouth two times a day x 10 days; cheratussin 5 cc by mouth qid as needed; hydrations; droplet precautions given (resp. masks given to the patient). Patient is to return in 10 days for a flu shot. Instructed to callo back with any questions. If feels worse ->needs to go to ED or call 911. Uupdated medication list for this problem includes:    Aspirin 325 Mg  Tabs (Aspirin) .Marland Kitchen... 1 tab daily    Aspir-trin 325 Mg Tbec (Aspirin) .Marland Kitchen... Take 1 tablet by mouth once a day    Biaxin 500 Mg Tabs (Clarithromycin) .Marland Kitchen... Take one table by mouth two times a day for 10 days  Instructed to complete antibiotics and call if not improved in 48 hours.   Complete Medication List: 1)  Aspirin 325 Mg Tabs (Aspirin) .Marland Kitchen.. 1 tab daily 2)  Metoprolol Tartrate 25 Mg Tabs (Metoprolol tartrate) .... Take 1 tablet by mouth two times a day 3)  Simvastatin 40 Mg Tabs (Simvastatin) .Marland Kitchen.. 1 tab daily 4)  Xanax 0.5 Mg Tabs (Alprazolam) .... Take one tablet by mouth qid as needed 5)  Percocet 5-325 Mg Tabs (Oxycodone-acetaminophen) .Marland Kitchen.. 1 tablet every 6 hours as needed for pain 6)  Flexeril 10 Mg Tabs (Cyclobenzaprine hcl) .... As needed 7)  Pacerone 200 Mg Tabs (Amiodarone hcl) .... Take 1 tablet by mouth two times a day 8)  Aspir-trin 325 Mg Tbec (Aspirin) .... Take 1 tablet by mouth once a day 9)  Paxil 20 Mg Tabs (Paroxetine hcl) .... Take 1 tablet daily 10)  Biaxin 500 Mg Tabs (Clarithromycin) .... Take one table by mouth two times a day for 10 days 11)  Cheratussin Ac 100-10 Mg/34ml Syrp (Guaifenesin-codeine) .... Take 5 cc by mouth q 4hours as needed for cough 12)  Flonase 50 Mcg/act Susp (Fluticasone propionate) .... One spray each nostril daily  Other Orders: T-Lipid Profile ([id number]) T-Hepatic Function ([phone])  Patient Instructions: 1)  Please, folllow up in 2 weeks if not feeling better Prescriptions: XANAX 0.5 MG TABS (ALPRAZOLAM) Take one tablet by mouth qid as needed  #120 x 3   Entered and Authorized by:   Deatra Robinson MD   Signed by:   Deatra Robinson MD on 03/06/2010   Method used:   Print then Give to Patient   RxID:   [id number] FLONASE 50 MCG/ACT SUSP (FLUTICASONE PROPIONATE) One spray each nostril daily  #1 x 0   Entered and Authorized by:   Deatra Robinson MD   Signed by:   Deatra Robinson MD on 03/06/2010   Method used:   Print  then Give to Patient   RxID:   [id number] CHERATUSSIN AC 100-10 MG/5ML SYRP (GUAIFENESIN-CODEINE) Take 5 cc by mouth q 4hours as needed for cough  #300 cc x 0   Entered and Authorized by:   Deatra Robinson MD   Signed by:   Deatra Robinson MD on 03/06/2010   Method used:   Print then Give to Patient   RxID:   [id number] BIAXIN 500 MG TABS (CLARITHROMYCIN) Take one table by mouth two times a day for 10 days  #20 x 0   Entered and Authorized by:   Deatra Robinson MD   Signed by:   Deatra Robinson MD on 03/06/2010   Method used:   Electronically to        [facility]  [phone]* (retail)       [address]       [place], [place]  [address]       Ph: [phone] or [phone]       Fax: [phone]   RxID:   [id number]    Orders Added: 1)  T-Lipid Profile [id number] 2)  T-Hepatic Function [id number] 3)  Est. Patient Level III [99371]    Prevention & Chronic Care Immunizations   Influenza vaccine: Historical  (09/28/2008)   Influenza vaccine deferral: Refused  (05/16/2009)    Tetanus booster: Not documented    Pneumococcal vaccine: Not documented  Other Screening   Smoking status: quit > 6 months  (03/06/2010)  Lipids   Total Cholesterol: Not documented   Lipid panel action/deferral: Lipid Panel ordered   LDL: Not documented   LDL Direct: Not documented   HDL: Not documented   Triglycerides: Not documented   Lipid panel due: 03/07/2011    SGOT (AST): Not documented   BMP action: Ordered   SGPT (ALT): Not documented   Alkaline phosphatase: Not documented   Total bilirubin: Not documented   Liver panel due: 03/07/2011    Lipid flowsheet reviewed?: No  Self-Management Support :   Personal Goals (by the next clinic visit) :      Personal LDL goal: 100  (03/06/2010)    Patient will  work on the following items until the next clinic visit to reach self-care goals:     Medications and monitoring: take my  medicines every day, bring all of my medications to every visit  (03/06/2010)     Eating: eat more vegetables, use fresh or frozen vegetables, eat baked foods instead of fried foods  (03/06/2010)     Activity: take a 30 minute walk every day  (03/06/2010)    Lipid self-management support: Lipid monitoring log, Written self-care plan  (03/06/2010)   Lipid self-care plan printed.  Process Orders Check Orders Results:     [facility]: Order checked:     Deatra Robinson MD NOT AUTHORIZED TO ORDER Tests Sent for requisitioning (March 06, 2010 11:52 AM):     03/06/2010: [facility] -- T-Lipid Profile [id number] (signed)     03/06/2010: [facility] -- T-Hepatic Function [phone] (signed)

## 2010-05-02 NOTE — Progress Notes (Signed)
  Recieved Request for Records from [facility] forwarded to [facility] for processing. John Davenport  April 12, 2009 8:33 AM

## 2010-05-02 NOTE — Assessment & Plan Note (Signed)
Summary: pc2/[facility]/ appt is 11:45/ gd   Visit Type:  Follow-up Referring Provider:  Corliss Marcus, MD Primary Provider:  [place] out pt   History of Present Illness: The patient presents today for routine electrophysiology followup. He reports developing worsening symptoms of chest pain after his icd implantation.  He had repeat cath which revealed reocclusion of LAD.  He therefore had single vessel CABG 1/11.  He has done well since that time. The patient denies symptoms of palpitations, chest pain, shortness of breath, orthopnea, PND, lower extremity edema, dizziness, presyncope, syncope, or neurologic sequela. The patient is tolerating medications without difficulties and is otherwise without complaint today.   Current Medications (verified): 1)  Aspirin 325 Mg Tabs (Aspirin) .Marland Kitchen.. 1 Tab Daily 2)  Lopressor 50 Mg Tabs (Metoprolol Tartrate) .Marland Kitchen.. 1 Tab Two Times A Day 3)  Simvastatin 40 Mg Tabs (Simvastatin) .Marland Kitchen.. 1 Tab Daily 4)  Alprazolam 1 Mg Tabs (Alprazolam) .Marland Kitchen.. 1 Tab Two Times A Day 5)  Percocet 5-325 Mg Tabs (Oxycodone-Acetaminophen) .... Uad 6)  Flexeril 10 Mg Tabs (Cyclobenzaprine Hcl) .... As Needed  Allergies (verified): No Known Drug Allergies  Past History:  Past Medical History: CAD s/p AMI 10/09 and 6/10 S/p PCI proximal LAD 10/09 with PTCA of ISR 6/10 Single vessel CABG 1/11 Ischemic CM (EF 30%) NYHA Class III CHF HYPERCHOLESTEROLEMIA (ICD-272.0) LOW BACK PAIN  ELBOW PAIN, RIGHT  TOBACCO ABUSE   Past Surgical History: Reviewed history from 12/29/2008 and no changes required. LAD stenting 10/09 and re-opening 6/10  Repeat catheterization 6/10 after PTCA:showed his EF was around 30-35% with anterior  akinesis.  He had 30% in-stent restenosis of the proximal LAD lesion,  30% proximal lesion in the second diagonal and a 40% proximal lesion in  the LAD at the bifurcation of the second diagonal.  There did not appear  to be obstructive disease to the  point where he required any more  vascularization such as bypass surgery.  Social History: Reviewed history from 12/29/2008 and no changes required. Pt lives with his mom, brother, and 78 yo son in [place]. 1/2 PPD (down from 2 packs) since age 37 alcohol abuse- prior, hasn't drank since age 29 drugs- remote (denies recent drug use) Currently unemployed  Review of Systems       All systems are reviewed and negative except as listed in the HPI.   Vital Signs:  Patient profile:   35 year old male Height:      74 inches Weight:      250 pounds BMI:     32.21 Pulse rate:   95 / minute BP sitting:   110 / 82  (left arm)  Vitals Entered By: Laurance Flatten CMA (May 07, 2009 12:11 PM)  Physical Exam  General:  Well developed, well nourished, in no acute distress. Head:  normocephalic and atraumatic Eyes:  PERRLA/EOM intact; conjunctiva and lids normal. Mouth:  Teeth, gums and palate normal. Oral mucosa normal. Neck:  Neck supple, no JVD. No masses, thyromegaly or abnormal cervical nodes. Chest Wall:  ICD pocket is well healed Lungs:  Clear bilaterally to auscultation and percussion. Heart:  Non-displaced PMI, chest non-tender; regular rate and rhythm, S1, S2 without murmurs, rubs or gallops. Carotid upstroke normal, no bruit. Normal abdominal aortic size, no bruits. Femorals normal pulses, no bruits. Pedals normal pulses. No edema, no varicosities. Abdomen:  Bowel sounds positive; abdomen soft and non-tender without masses, organomegaly, or hernias noted. No hepatosplenomegaly. Msk:  Back normal,  normal gait. Muscle strength and tone normal. Pulses:  pulses normal in all 4 extremities Extremities:  No clubbing or cyanosis. Neurologic:  Alert and oriented x 3. Skin:  surgical incision is healing nicely Cervical Nodes:  no significant adenopathy Psych:  Normal affect.    ICD Specifications Following MD:  Hillis Range, MD     Referring MD:  [facility] ICD Vendor:  [facility]  Scientific     ICD Model Number:  E110     ICD Serial Number:  [id number] ICD DOI:  01/02/2009     ICD Implanting MD:  Hillis Range, MD  Lead 1:    Location: RA     DOI: 01/02/2009     Model #: [id number]     Serial #: [id number]     Status: active Lead 2:    Location: RV     DOI: 01/02/2009     Model #: [id number]     Serial #: [id number]     Status: active  Indications::  ICM   ICD Follow Up Remote Check?  No Battery Voltage:  OK V     Charge Time:  8.5 seconds     Battery Est. Longevity:  8.5 YEARS Underlying rhythm:  SR ICD Dependent:  No       ICD Device Measurements Atrium:  Amplitude: 2.5 mV, Impedance: 580 ohms, Threshold: 0.8 V at 0.4 msec Right Ventricle:  Amplitude: 14.5 mV, Impedance: 490 ohms, Threshold: 0.3 V at 0.4 msec Shock Impedance: 58 ohms   Episodes Shock:  0     ATP:  0     Nonsustained:  2     Atrial Pacing:  <1%     Ventricular Pacing:  <1%  Brady Parameters Mode DDI     Lower Rate Limit:  40     PAV 350      Tachy Zones VF:  200     VT:  170     Next Cardiology Appt Due:  07/30/2009 Tech Comments:  Normal device function.  No changes made today.  MADIT-RIT check done by industry. Gypsy Balsam RN BSN  May 07, 2009 12:53 PM   Impression & Recommendations:  Problem # 1:  ISCHEMIC CARDIOMYOPATHY (ICD-414.8) Stable Normal ICD function. No changes today  The following medications were removed from the medication list:    Vasotec 5 Mg Tabs (Enalapril maleate) .Marland Kitchen... 1 tab every 12 hours His updated medication list for this problem includes:    Aspirin 325 Mg Tabs (Aspirin) .Marland Kitchen... 1 tab daily    Lopressor 50 Mg Tabs (Metoprolol tartrate) .Marland Kitchen... 1 tab two times a day  Problem # 2:  TOBACCO ABUSE (ICD-305.1) cessation again encouraged  Problem # 3:  CAD (ICD-414.00) s/p recent CABG. Has follow-up with CV surgery today  The following medications were removed from the medication list:    Vasotec 5 Mg Tabs (Enalapril maleate) .Marland Kitchen... 1 tab every 12 hours His updated  medication list for this problem includes:    Aspirin 325 Mg Tabs (Aspirin) .Marland Kitchen... 1 tab daily    Lopressor 50 Mg Tabs (Metoprolol tartrate) .Marland Kitchen... 1 tab two times a day  Patient Instructions: 1)  return in 3 months

## 2010-05-16 NOTE — Cardiovascular Report (Signed)
Summary: Office Visit   Office Visit   Imported By: Roderic Ovens 05/10/2010 11:52:26  _____________________________________________________________________  External Attachment:    Type:   Image     Comment:   External Document

## 2010-05-24 ENCOUNTER — Other Ambulatory Visit: Payer: Self-pay | Admitting: *Deleted

## 2010-05-28 ENCOUNTER — Encounter (INDEPENDENT_AMBULATORY_CARE_PROVIDER_SITE_OTHER): Payer: Self-pay | Admitting: *Deleted

## 2010-05-28 ENCOUNTER — Encounter: Payer: Self-pay | Admitting: Internal Medicine

## 2010-05-29 ENCOUNTER — Encounter: Payer: Self-pay | Admitting: Internal Medicine

## 2010-05-29 ENCOUNTER — Encounter (INDEPENDENT_AMBULATORY_CARE_PROVIDER_SITE_OTHER): Payer: Medicaid Other | Admitting: Internal Medicine

## 2010-05-29 DIAGNOSIS — I5022 Chronic systolic (congestive) heart failure: Secondary | ICD-10-CM

## 2010-05-29 DIAGNOSIS — I2589 Other forms of chronic ischemic heart disease: Secondary | ICD-10-CM

## 2010-05-29 DIAGNOSIS — I10 Essential (primary) hypertension: Secondary | ICD-10-CM

## 2010-05-29 DIAGNOSIS — K219 Gastro-esophageal reflux disease without esophagitis: Secondary | ICD-10-CM

## 2010-05-29 DIAGNOSIS — E7800 Pure hypercholesterolemia, unspecified: Secondary | ICD-10-CM

## 2010-05-29 NOTE — Progress Notes (Signed)
Subjective:      Patient ID: John Davenport is a 35 y.o. male.  Chief Complaint: HPI The patient presents today for routine electrophysiology followup. he reports doing very well since last being seen in our clinic.  His indigestion/ chest pain have resolved with nexium.  His SOB is gradually improving also.  He reports occasional orthostatic dizzines.  he denies symptoms of palpitations, chest pain,orthopnea, PND, lower extremity edema, presyncope, syncope, or neurologic sequela.  He did not tolerate ISMN due to headaches and is therefore no longer taking this medicine.  he is otherwise without complaint today.   Past Medical History  Diagnosis Date  . Ischemia   . Hypercholesteremia   . LBP (low back pain)   . Coronary artery disease     CAD s/p AMI 10/09 and 6/10  . CHF (congestive heart failure)     Ischemic CM (EF 30%) with NYHA Class III CHF  . Back pain    Past Surgical History  Procedure Date  . Cardiac defibrillator placement   . Coronary artery bypass graft     1/11  . Coronary angioplasty with stent placement     LAD stenting 10/09 and re-opening 6/10  . Cardiac defibrillator placement     2010 by JA    Current outpatient prescriptions:ALPRAZolam (XANAX) 1 MG tablet, Take 1 mg by mouth at bedtime as needed.  , Disp: , Rfl: ;  aspirin 325 MG tablet, Take 325 mg by mouth daily.  , Disp: , Rfl: ;  carvedilol (COREG) 6.25 MG tablet, Take 6.25 mg by mouth 2 (two) times daily with a meal.  , Disp: , Rfl: ;  esomeprazole (NEXIUM) 40 MG capsule, Take 40 mg by mouth daily before breakfast.  , Disp: , Rfl:  fluticasone (FLONASE) 50 MCG/ACT nasal spray, 2 sprays by Nasal route daily.  , Disp: , Rfl: ;  isosorbide mononitrate (IMDUR) 30 MG 24 hr tablet, Take 30 mg by mouth daily.  , Disp: , Rfl: ;  lisinopril (PRINIVIL,ZESTRIL) 10 MG tablet, Take 10 mg by mouth daily.  , Disp: , Rfl: ;  simvastatin (ZOCOR) 40 MG tablet, Take 20 mg by mouth at bedtime. Take half tablet of 40 mg at  bedtime , Disp: , Rfl:   Allergies  Allergen Reactions  . Oxycodone Hcl     History   Social History  . Marital Status: Single    Spouse Name: N/A    Number of Children: N/A  . Years of Education: N/A   Occupational History  . Not on file.   Social History Main Topics  . Smoking status: Former Smoker    Quit date: 04/04/2009  . Smokeless tobacco: Not on file  . Alcohol Use: No  . Drug Use: Not on file  . Sexually Active:    Other Topics Concern  . Not on file   Social History Narrative   Pt lives with his mom, brother, and 72 yo son in [place].Quit tobacco in Jan. 2011 prior to heart surgery (prior -1/2 PPD (down from 1 ppd per day, onset age 5) alcohol abuse- prior, hasn't drank since age 25no illicit drug use Currently unemployed, awaiting disabilitySingle    Family History  Problem Relation Age of Onset  . Heart attack Father    ROS  All systems are reviewed and are negative except as outlined in the HPI above      Objective:    Physical Exam  Constitutional: He appears healthy. No distress.  HENT:  Mouth/Throat: Oropharynx is clear.  Eyes: Pupils are equal, round, and reactive to light.  Neck: Normal range of motion. Neck supple. No JVD present. No adenopathy.  Cardiovascular: Normal rate, regular rhythm, normal heart sounds and normal pulses.  PMI is not displaced.  Exam reveals no gallop.   No murmur heard.      ICD pocket is well healed  Pulmonary/Chest: Effort normal and breath sounds normal. He has no wheezes. He has no rales. He exhibits no tenderness.  Abdominal: Soft. Bowel sounds are normal. He exhibits no distension. There is no tenderness.  Musculoskeletal: Normal range of motion. He exhibits no edema, no tenderness and no deformity.  Neurological: He is alert and oriented to person, place, and time. He has normal motor skills.  Skin: Skin is warm and dry. No rash noted. No pallor.      Assessment:        Plan:

## 2010-05-29 NOTE — Assessment & Plan Note (Signed)
Much improved with nexium

## 2010-05-29 NOTE — Assessment & Plan Note (Signed)
Above goal Increase lisinopril to 20mg  daily today Repeat BMET upon return Salt restriction advised

## 2010-05-29 NOTE — Assessment & Plan Note (Addendum)
The patient has chronic systolic dysfunction.  His CHF appears to be stable at this time.  We will increase lisinopril to 20mg  daily today.  He will need repeat BMET upon return.

## 2010-05-29 NOTE — Assessment & Plan Note (Signed)
Stable.  Continue simvastatin. 

## 2010-06-06 NOTE — Assessment & Plan Note (Signed)
Summary: 6 wk follow up/sl/rs per pt call/gd/kl   Visit Type:  Follow-up Referring Provider:  Corliss Marcus, MD Primary Provider:  [facility] out pt  CC:  some shortness of breath.  History of Present Illness: see scanned report from EPIC  Problems Prior to Update: 1)  Gerd  (ICD-530.81) 2)  Paroxysmal Ventricular Tachycardia  (ICD-427.1) 3)  Acute On Chronic Systolic Heart Failure  (ICD-428.23) 4)  Dyspnea  (ICD-786.05) 5)  Anxiety  (ICD-300.00) 6)  Tobacco Abuse  (ICD-305.1) 7)  Cad  (ICD-414.00) 8)  Myocardial Infarction  (ICD-410.90) 9)  Chest Pain  (ICD-786.50) 10)  Ischemic Cardiomyopathy  (ICD-414.8) 11)  Hypercholesterolemia  (ICD-272.0) 12)  Low Back Pain, Acute  (ICD-724.2) 13)  Elbow Pain, Right  (ICD-719.42) 14)  Tobacco Abuse  (ICD-305.1)  Current Medications (verified): 1)  Aspirin 325 Mg Tabs (Aspirin) .Marland Kitchen.. 1 Tab Daily 2)  Carvedilol 6.25 Mg Tabs (Carvedilol) .... One By Mouth Bid 3)  Simvastatin 40 Mg Tabs (Simvastatin) .... Take 1/2 Tablet Daily(20mg ) 4)  Alprazolam 1 Mg Tabs (Alprazolam) .... As Needed 5)  Flonase 50 Mcg/act Susp (Fluticasone Propionate) .... One Spray Each Nostril Daily 6)  Lisinopril 10 Mg Tabs (Lisinopril) .... Take 1tablet By Mouth Daily 7)  Isosorbide Mononitrate Cr 30 Mg Xr24h-Tab (Isosorbide Mononitrate) .... Take One Tablet By Mouth Daily 8)  Nexium 40 Mg Cpdr (Esomeprazole Magnesium) .... One By Mouth Daily 9)  Nitrostat 0.4 Mg Subl (Nitroglycerin) .... Use As Directed  Allergies (verified): 1)  ! * Oxycontin  Past History:  Past Medical History: Last updated: 03/22/2010 CAD s/p AMI 10/09 and 6/10 S/p PCI proximal LAD 10/09 with PTCA of ISR 6/10 Single vessel CABG 1/11   a.  cath 02/2009:  CFX and RCA ok with R-L collats. to occluded LAD   b.  placed on amiodarone post op for afib Ischemic CM (EF 30%) NYHA Class III CHF HYPERCHOLESTEROLEMIA (ICD-272.0) LOW BACK PAIN  ELBOW PAIN, RIGHT  TOBACCO ABUSE  Nonsustained  ventricular tachycardia  Vital Signs:  Patient profile:   35 year old male Height:      74 inches Weight:      272 pounds BMI:     35.05 Pulse rate:   92 / minute BP sitting:   130 / 88  (left arm) Cuff size:   regular  Vitals Entered By: Micki Riley CNA (May 29, 2010 9:53 AM)    ICD Specifications Following MD:  Hillis Range, MD     Referring MD:  [facility] ICD Vendor:  [employer]     ICD Model Number:  E110     ICD Serial Number:  [id number] ICD DOI:  01/02/2009     ICD Implanting MD:  Hillis Range, MD  Lead 1:    Location: RA     DOI: 01/02/2009     Model #: [id number]     Serial #: [id number]     Status: active Lead 2:    Location: RV     DOI: 01/02/2009     Model #: [id number]     Serial #: [id number]     Status: active  Indications::  ICM   ICD Follow Up ICD Dependent:  No      Episodes Coumadin:  No  Brady Parameters Mode DDI     Lower Rate Limit:  50     PAV 350      Tachy Zones VF:  200     VT:  170     Impression &  Recommendations: see scanned report from EPIC  Patient Instructions: 1)  Your physician wants you to follow-up in: 3 months with Dr Johney Frame  Bonita Quin will receive a reminder letter in the mail two months in advance. If you don't receive a letter, please call our office to schedule the follow-up appointment.

## 2010-06-06 NOTE — Progress Notes (Signed)
Summary: [facility] Note  [facility] Note   Imported By: Marylou Mccoy 05/30/2010 09:59:16  _____________________________________________________________________  External Attachment:    Type:   Image     Comment:   External Document

## 2010-06-06 NOTE — Letter (Signed)
Summary: Appointment - Reschedule  [facility], Main Office  [address]   [place], [place] [address]   Phone: [phone]  Fax: [phone]     May 28, 2010 MRN: [id number]   CORION SHERROD [address] [place], [place]  [address]   Dear Mr. ALBUS,   Due to a change in our office schedule, your appointment on  06-05-10  at  11:30a              must be changed.  It is very important that we reach you to reschedule this appointment. We look forward to participating in your health care needs. Please contact us at the number listed above at your earliest convenience to reschedule this appointment.     Sincerely,  Glass blower/designer

## 2010-06-14 LAB — CBC
HCT: 45.9 % (ref 39.0–52.0)
MCHC: 33.3 g/dL (ref 30.0–36.0)
MCV: 90.2 fL (ref 78.0–100.0)
Platelets: 227 10*3/uL (ref 150–400)
RBC: 5.09 MIL/uL (ref 4.22–5.81)

## 2010-06-14 LAB — BASIC METABOLIC PANEL
BUN: 13 mg/dL (ref 6–23)
CO2: 25 mEq/L (ref 19–32)
Chloride: 106 mEq/L (ref 96–112)
GFR calc Af Amer: >60 mL/min (ref >60)
Potassium: 3.8 mEq/L (ref 3.5–5.1)
Sodium: 140 mEq/L (ref 135–145)

## 2010-06-14 LAB — WOUND CULTURE: Gram Stain: NONE SEEN

## 2010-06-14 LAB — POCT CARDIAC MARKERS
CKMB, poc: 1.9 ng/mL (ref 1.0–8.0)
Myoglobin, poc: 115 ng/mL (ref 12–200)
Myoglobin, poc: 78.8 ng/mL (ref 12–200)
Troponin i, poc: <0.05 ng/mL (ref 0.00–0.09)

## 2010-06-14 LAB — DIFFERENTIAL
Eosinophils Absolute: 0.2 10*3/uL (ref 0.0–0.7)
Lymphocytes Relative: 30 % (ref 12–46)
Neutro Abs: 6.7 10*3/uL (ref 1.7–7.7)
Neutrophils Relative %: 57 % (ref 43–77)

## 2010-06-14 LAB — CK TOTAL AND CKMB (NOT AT ARMC)
CK, MB: 3.3 ng/mL (ref 0.3–4.0)
Relative Index: 1.7 (ref 0.0–2.5)
Relative Index: 2.1 (ref 0.0–2.5)
Total CK: 109 U/L (ref 7–232)
Total CK: 134 U/L (ref 7–232)
Total CK: 160 U/L (ref 7–232)

## 2010-06-14 LAB — POCT I-STAT, CHEM 8
BUN: 14 mg/dL (ref 6–23)
Glucose, Bld: 85 mg/dL (ref 70–99)
HCT: 48 % (ref 39.0–52.0)
Potassium: 3.4 mEq/L — ABNORMAL LOW (ref 3.5–5.1)
Sodium: 139 mEq/L (ref 135–145)

## 2010-06-14 LAB — TROPONIN I
Troponin I: 0.02 ng/mL (ref 0.00–0.06)
Troponin I: 0.03 ng/mL (ref 0.00–0.06)
Troponin I: 0.04 ng/mL (ref 0.00–0.06)

## 2010-06-14 LAB — LIPID PANEL
Total CHOL/HDL Ratio: 5.2 RATIO
VLDL: 38 mg/dL (ref 0–40)

## 2010-06-14 LAB — PROTIME-INR: INR: 0.89 (ref 0.00–1.49)

## 2010-06-14 LAB — APTT: aPTT: 27 seconds (ref 24–37)

## 2010-06-16 LAB — POCT I-STAT 4, (NA,K, GLUC, HGB,HCT)
Glucose, Bld: 102 mg/dL — ABNORMAL HIGH (ref 70–99)
Glucose, Bld: 103 mg/dL — ABNORMAL HIGH (ref 70–99)
HCT: 42 % (ref 39.0–52.0)
HCT: 43 % (ref 39.0–52.0)
Hemoglobin: 14.6 g/dL (ref 13.0–17.0)
Hemoglobin: 14.6 g/dL (ref 13.0–17.0)
Potassium: 4.1 mEq/L (ref 3.5–5.1)
Potassium: 4.9 mEq/L (ref 3.5–5.1)
Sodium: 139 mEq/L (ref 135–145)
Sodium: 141 mEq/L (ref 135–145)

## 2010-06-16 LAB — GLUCOSE, CAPILLARY
Glucose-Capillary: 100 mg/dL — ABNORMAL HIGH (ref 70–99)
Glucose-Capillary: 102 mg/dL — ABNORMAL HIGH (ref 70–99)
Glucose-Capillary: 117 mg/dL — ABNORMAL HIGH (ref 70–99)
Glucose-Capillary: 119 mg/dL — ABNORMAL HIGH (ref 70–99)
Glucose-Capillary: 122 mg/dL — ABNORMAL HIGH (ref 70–99)
Glucose-Capillary: 131 mg/dL — ABNORMAL HIGH (ref 70–99)

## 2010-06-16 LAB — CBC
HCT: 39.7 % (ref 39.0–52.0)
HCT: 40.3 % (ref 39.0–52.0)
HCT: 39.7 % (ref 39.0–52.0)
Hemoglobin: 13.5 g/dL (ref 13.0–17.0)
Hemoglobin: 13.6 g/dL (ref 13.0–17.0)
Hemoglobin: 13.8 g/dL (ref 13.0–17.0)
Hemoglobin: 16 g/dL (ref 13.0–17.0)
Hemoglobin: 13.1 g/dL (ref 13.0–17.0)
MCHC: 33.5 g/dL (ref 30.0–36.0)
MCHC: 33.8 g/dL (ref 30.0–36.0)
MCHC: 33.9 g/dL (ref 30.0–36.0)
MCV: 88.7 fL (ref 78.0–100.0)
MCV: 88.8 fL (ref 78.0–100.0)
Platelets: 136 10*3/uL — ABNORMAL LOW (ref 150–400)
Platelets: 175 10*3/uL (ref 150–400)
RBC: 4.43 MIL/uL (ref 4.22–5.81)
RBC: 4.74 MIL/uL (ref 4.22–5.81)
RBC: 5.32 MIL/uL (ref 4.22–5.81)
RDW: 14.1 % (ref 11.5–15.5)
RDW: 14.1 % (ref 11.5–15.5)
RDW: 14.2 % (ref 11.5–15.5)
WBC: 11.1 10*3/uL — ABNORMAL HIGH (ref 4.0–10.5)
WBC: 21.4 10*3/uL — ABNORMAL HIGH (ref 4.0–10.5)
WBC: 16.2 10*3/uL — ABNORMAL HIGH (ref 4.0–10.5)

## 2010-06-16 LAB — URINALYSIS, ROUTINE W REFLEX MICROSCOPIC
Nitrite: NEGATIVE
Specific Gravity, Urine: 1.013 (ref 1.005–1.030)
Urobilinogen, UA: 0.2 mg/dL (ref 0.0–1.0)
pH: 6 (ref 5.0–8.0)

## 2010-06-16 LAB — PROTIME-INR: INR: 0.96 (ref 0.00–1.49)

## 2010-06-16 LAB — COMPREHENSIVE METABOLIC PANEL
ALT: 47 U/L (ref 0–53)
AST: 29 U/L (ref 0–37)
Alkaline Phosphatase: 69 U/L (ref 39–117)
Calcium: 9.4 mg/dL (ref 8.4–10.5)
GFR calc Af Amer: >60 mL/min (ref >60)
Glucose, Bld: 96 mg/dL (ref 70–99)
Potassium: 4.8 mEq/L (ref 3.5–5.1)
Sodium: 135 mEq/L (ref 135–145)
Total Protein: 7.2 g/dL (ref 6.0–8.3)

## 2010-06-16 LAB — POCT I-STAT, CHEM 8
Calcium, Ion: 1.22 mmol/L (ref 1.12–1.32)
Chloride: 104 mEq/L (ref 96–112)
Creatinine, Ser: 2.1 mg/dL — ABNORMAL HIGH (ref 0.4–1.5)
HCT: 33 % — ABNORMAL LOW (ref 39.0–52.0)
HCT: 46 % (ref 39.0–52.0)
Hemoglobin: 11.2 g/dL — ABNORMAL LOW (ref 13.0–17.0)
Hemoglobin: 15.6 g/dL (ref 13.0–17.0)
Potassium: 3.9 mEq/L (ref 3.5–5.1)
Sodium: 137 mEq/L (ref 135–145)
Sodium: 139 mEq/L (ref 135–145)
TCO2: 27 mmol/L (ref 0–100)

## 2010-06-16 LAB — BLOOD GAS, ARTERIAL
Acid-base deficit: 0.1 mmol/L (ref 0.0–2.0)
Drawn by: 206361
FIO2: 0.21 %
pCO2 arterial: 40.2 mmHg (ref 35.0–45.0)
pO2, Arterial: 81.6 mmHg (ref 80.0–100.0)

## 2010-06-16 LAB — BASIC METABOLIC PANEL
BUN: 26 mg/dL — ABNORMAL HIGH (ref 6–23)
BUN: 14 mg/dL (ref 6–23)
CO2: 24 mEq/L (ref 19–32)
CO2: 31 mEq/L (ref 19–32)
Calcium: 8.2 mg/dL — ABNORMAL LOW (ref 8.4–10.5)
Calcium: 8.7 mg/dL (ref 8.4–10.5)
Creatinine, Ser: 1.74 mg/dL — ABNORMAL HIGH (ref 0.4–1.5)
GFR calc non Af Amer: 45 mL/min — ABNORMAL LOW (ref >60)
GFR calc non Af Amer: 57 mL/min — ABNORMAL LOW (ref >60)
GFR calc non Af Amer: >60 mL/min (ref >60)
Glucose, Bld: 142 mg/dL — ABNORMAL HIGH (ref 70–99)
Glucose, Bld: 137 mg/dL — ABNORMAL HIGH (ref 70–99)
Potassium: 4.7 mEq/L (ref 3.5–5.1)
Sodium: 133 mEq/L — ABNORMAL LOW (ref 135–145)
Sodium: 134 mEq/L — ABNORMAL LOW (ref 135–145)

## 2010-06-16 LAB — URINE MICROSCOPIC-ADD ON

## 2010-06-16 LAB — POCT I-STAT 3, ART BLOOD GAS (G3+)
Acid-Base Excess: 3 mmol/L — ABNORMAL HIGH (ref 0.0–2.0)
Bicarbonate: 26.3 mEq/L — ABNORMAL HIGH (ref 20.0–24.0)
Bicarbonate: 29.2 mEq/L — ABNORMAL HIGH (ref 20.0–24.0)
O2 Saturation: 94 %
O2 Saturation: 95 %
Patient temperature: 35.4
TCO2: 25 mmol/L (ref 0–100)
TCO2: 29 mmol/L (ref 0–100)
TCO2: 31 mmol/L (ref 0–100)
pCO2 arterial: 45.8 mmHg — ABNORMAL HIGH (ref 35.0–45.0)
pCO2 arterial: 48.7 mmHg — ABNORMAL HIGH (ref 35.0–45.0)
pCO2 arterial: 49.3 mmHg — ABNORMAL HIGH (ref 35.0–45.0)
pH, Arterial: 7.288 — ABNORMAL LOW (ref 7.350–7.450)
pH, Arterial: 7.363 (ref 7.350–7.450)
pO2, Arterial: 378 mmHg — ABNORMAL HIGH (ref 80.0–100.0)
pO2, Arterial: 403 mmHg — ABNORMAL HIGH (ref 80.0–100.0)
pO2, Arterial: 71 mmHg — ABNORMAL LOW (ref 80.0–100.0)
pO2, Arterial: 82 mmHg (ref 80.0–100.0)

## 2010-06-16 LAB — HEMOGLOBIN A1C: Hgb A1c MFr Bld: 5.4 % (ref 4.6–6.1)

## 2010-06-16 LAB — CREATININE, SERUM
Creatinine, Ser: 1.52 mg/dL — ABNORMAL HIGH (ref 0.4–1.5)
GFR calc Af Amer: >60 mL/min (ref >60)
GFR calc non Af Amer: 52 mL/min — ABNORMAL LOW (ref >60)
GFR calc non Af Amer: >60 mL/min (ref >60)

## 2010-06-16 LAB — MAGNESIUM: Magnesium: 2.4 mg/dL (ref 1.5–2.5)

## 2010-06-16 LAB — APTT
aPTT: 30 seconds (ref 24–37)
aPTT: 33 seconds (ref 24–37)

## 2010-06-16 LAB — TYPE AND SCREEN

## 2010-06-16 LAB — MRSA PCR SCREENING: MRSA by PCR: NEGATIVE

## 2010-06-17 ENCOUNTER — Encounter (INDEPENDENT_AMBULATORY_CARE_PROVIDER_SITE_OTHER): Payer: Medicaid Other

## 2010-06-17 ENCOUNTER — Encounter: Payer: Self-pay | Admitting: Internal Medicine

## 2010-06-17 DIAGNOSIS — I428 Other cardiomyopathies: Secondary | ICD-10-CM

## 2010-07-02 LAB — BASIC METABOLIC PANEL
BUN: 13 mg/dL (ref 6–23)
BUN: 15 mg/dL (ref 6–23)
Chloride: 104 mEq/L (ref 96–112)
Creatinine, Ser: 1.3 mg/dL (ref 0.4–1.5)
Creatinine, Ser: 1.34 mg/dL (ref 0.4–1.5)
GFR calc Af Amer: >60 mL/min (ref >60)
GFR calc non Af Amer: >60 mL/min (ref >60)
GFR calc non Af Amer: >60 mL/min (ref >60)
Glucose, Bld: 131 mg/dL — ABNORMAL HIGH (ref 70–99)
Potassium: 3.4 mEq/L — ABNORMAL LOW (ref 3.5–5.1)
Potassium: 3.7 mEq/L (ref 3.5–5.1)

## 2010-07-02 LAB — HEPARIN LEVEL (UNFRACTIONATED): Heparin Unfractionated: 0.41 IU/mL (ref 0.30–0.70)

## 2010-07-02 LAB — CBC
HCT: 41.5 % (ref 39.0–52.0)
MCV: 88.8 fL (ref 78.0–100.0)
Platelets: 184 10*3/uL (ref 150–400)
Platelets: 201 10*3/uL (ref 150–400)
RBC: 4.88 MIL/uL (ref 4.22–5.81)
RDW: 14.5 % (ref 11.5–15.5)
WBC: 10.3 10*3/uL (ref 4.0–10.5)
WBC: 11.6 10*3/uL — ABNORMAL HIGH (ref 4.0–10.5)

## 2010-07-02 NOTE — Procedures (Signed)
Summary: 3 month  mca   Current Medications (verified): 1)  Aspirin 325 Mg Tabs (Aspirin) .Marland Kitchen.. 1 Tab Daily 2)  Carvedilol 6.25 Mg Tabs (Carvedilol) .... One By Mouth Bid 3)  Simvastatin 40 Mg Tabs (Simvastatin) .... Take 1/2 Tablet Daily(20mg ) 4)  Alprazolam 1 Mg Tabs (Alprazolam) .... As Needed 5)  Flonase 50 Mcg/act Susp (Fluticasone Propionate) .... One Spray Each Nostril Daily 6)  Lisinopril 10 Mg Tabs (Lisinopril) .... Take 1tablet By Mouth Daily 7)  Isosorbide Mononitrate Cr 30 Mg Xr24h-Tab (Isosorbide Mononitrate) .... Take One Tablet By Mouth Daily 8)  Nexium 40 Mg Cpdr (Esomeprazole Magnesium) .... One By Mouth Daily 9)  Nitrostat 0.4 Mg Subl (Nitroglycerin) .... Use As Directed  Allergies (verified): 1)  ! * Oxycontin   ICD Specifications Following MD:  Hillis Range, MD     Referring MD:  [facility] ICD Vendor:  [employer]     ICD Model Number:  E110     ICD Serial Number:  [id number] ICD DOI:  01/02/2009     ICD Implanting MD:  Hillis Range, MD  Lead 1:    Location: RA     DOI: 01/02/2009     Model #: [id number]     Serial #: [id number]     Status: active Lead 2:    Location: RV     DOI: 01/02/2009     Model #: [id number]     Serial #: [id number]     Status: active  Indications::  ICM   ICD Follow Up ICD Dependent:  No      Episodes Coumadin:  No  Brady Parameters Mode DDI     Lower Rate Limit:  50     PAV 350      Tachy Zones VF:  200     VT:  170     Tech Comments:  meds reviewed. see paceart report. Vella Kohler  June 17, 2010 9:14 AM

## 2010-07-08 ENCOUNTER — Other Ambulatory Visit: Payer: Self-pay | Admitting: *Deleted

## 2010-07-08 LAB — DIFFERENTIAL
Basophils Relative: 1 % (ref 0–1)
Lymphocytes Relative: 13 % (ref 12–46)
Lymphs Abs: 1.9 10*3/uL (ref 0.7–4.0)
Monocytes Absolute: 1.7 10*3/uL — ABNORMAL HIGH (ref 0.1–1.0)
Monocytes Relative: 12 % (ref 3–12)
Neutro Abs: 10.3 10*3/uL — ABNORMAL HIGH (ref 1.7–7.7)
Neutrophils Relative %: 72 % (ref 43–77)

## 2010-07-08 LAB — APTT: aPTT: 55 seconds — ABNORMAL HIGH (ref 24–37)

## 2010-07-08 LAB — CBC
HCT: 37.6 % — ABNORMAL LOW (ref 39.0–52.0)
HCT: 40.2 % (ref 39.0–52.0)
Hemoglobin: 12.7 g/dL — ABNORMAL LOW (ref 13.0–17.0)
Hemoglobin: 12.7 g/dL — ABNORMAL LOW (ref 13.0–17.0)
Hemoglobin: 13.5 g/dL (ref 13.0–17.0)
Hemoglobin: 13.7 g/dL (ref 13.0–17.0)
Hemoglobin: 14.1 g/dL (ref 13.0–17.0)
MCHC: 33.6 g/dL (ref 30.0–36.0)
MCHC: 33.7 g/dL (ref 30.0–36.0)
MCV: 90.9 fL (ref 78.0–100.0)
Platelets: 175 10*3/uL (ref 150–400)
RBC: 4.19 MIL/uL — ABNORMAL LOW (ref 4.22–5.81)
RBC: 4.43 MIL/uL (ref 4.22–5.81)
RBC: 4.74 MIL/uL (ref 4.22–5.81)
RDW: 13.3 % (ref 11.5–15.5)
WBC: 12.1 10*3/uL — ABNORMAL HIGH (ref 4.0–10.5)
WBC: 12.5 10*3/uL — ABNORMAL HIGH (ref 4.0–10.5)
WBC: 14.4 10*3/uL — ABNORMAL HIGH (ref 4.0–10.5)

## 2010-07-08 LAB — HEPATIC FUNCTION PANEL
ALT: 136 U/L — ABNORMAL HIGH (ref 0–53)
AST: 48 U/L — ABNORMAL HIGH (ref 0–37)
Albumin: 2.9 g/dL — ABNORMAL LOW (ref 3.5–5.2)
Alkaline Phosphatase: 163 U/L — ABNORMAL HIGH (ref 39–117)
Bilirubin, Direct: 0.1 mg/dL (ref 0.0–0.3)
Indirect Bilirubin: 0.4 mg/dL (ref 0.3–0.9)
Total Bilirubin: 0.5 mg/dL (ref 0.3–1.2)
Total Protein: 6.6 g/dL (ref 6.0–8.3)

## 2010-07-08 LAB — POCT CARDIAC MARKERS
CKMB, poc: <1 ng/mL — ABNORMAL LOW (ref 1.0–8.0)
CKMB, poc: <1 ng/mL — ABNORMAL LOW (ref 1.0–8.0)
Myoglobin, poc: 86.9 ng/mL (ref 12–200)
Myoglobin, poc: 91.9 ng/mL (ref 12–200)
Troponin i, poc: <0.05 ng/mL (ref 0.00–0.09)
Troponin i, poc: <0.05 ng/mL (ref 0.00–0.09)

## 2010-07-08 LAB — POCT I-STAT, CHEM 8
BUN: 9 mg/dL (ref 6–23)
Calcium, Ion: 1.13 mmol/L (ref 1.12–1.32)
Chloride: 99 mEq/L (ref 96–112)
Creatinine, Ser: 1.2 mg/dL (ref 0.4–1.5)
Glucose, Bld: 86 mg/dL (ref 70–99)
HCT: 44 % (ref 39.0–52.0)
Hemoglobin: 15 g/dL (ref 13.0–17.0)
Potassium: 4.4 mEq/L (ref 3.5–5.1)
Sodium: 136 meq/L (ref 135–145)
TCO2: 29 mmol/L (ref 0–100)

## 2010-07-08 LAB — BASIC METABOLIC PANEL
Calcium: 8.8 mg/dL (ref 8.4–10.5)
Creatinine, Ser: 1.17 mg/dL (ref 0.4–1.5)
GFR calc Af Amer: >60 mL/min (ref >60)

## 2010-07-08 LAB — TROPONIN I
Troponin I: 0.02 ng/mL (ref 0.00–0.06)
Troponin I: 0.02 ng/mL (ref 0.00–0.06)

## 2010-07-08 LAB — LIPASE, BLOOD: Lipase: 18 U/L (ref 11–59)

## 2010-07-08 LAB — PROTIME-INR
INR: 1 (ref 0.00–1.49)
Prothrombin Time: 13.8 s (ref 11.6–15.2)

## 2010-07-08 LAB — HEPARIN LEVEL (UNFRACTIONATED): Heparin Unfractionated: 0.24 IU/mL — ABNORMAL LOW (ref 0.30–0.70)

## 2010-07-08 LAB — RAPID URINE DRUG SCREEN, HOSP PERFORMED
Barbiturates: NOT DETECTED
Cocaine: NOT DETECTED

## 2010-07-08 LAB — CK TOTAL AND CKMB (NOT AT ARMC)
CK, MB: 0.7 ng/mL (ref 0.3–4.0)
Relative Index: INVALID (ref 0.0–2.5)
Relative Index: INVALID (ref 0.0–2.5)
Total CK: 33 U/L (ref 7–232)
Total CK: 42 U/L (ref 7–232)

## 2010-07-09 ENCOUNTER — Other Ambulatory Visit: Payer: Self-pay | Admitting: Internal Medicine

## 2010-07-09 MED ORDER — ALPRAZOLAM 1 MG PO TABS: 1.0000 mg | ORAL_TABLET | Freq: Every evening | ORAL | Status: DC | PRN

## 2010-07-09 NOTE — Telephone Encounter (Signed)
Alprazolam rx called in to [facility].

## 2010-07-09 NOTE — Telephone Encounter (Signed)
Rx called in 

## 2010-07-26 NOTE — Telephone Encounter (Signed)
Rx called in 

## 2010-07-31 ENCOUNTER — Encounter: Payer: Self-pay | Admitting: Internal Medicine

## 2010-07-31 ENCOUNTER — Ambulatory Visit (INDEPENDENT_AMBULATORY_CARE_PROVIDER_SITE_OTHER): Payer: Medicaid Other | Admitting: Internal Medicine

## 2010-07-31 DIAGNOSIS — I2589 Other forms of chronic ischemic heart disease: Secondary | ICD-10-CM

## 2010-07-31 DIAGNOSIS — E7800 Pure hypercholesterolemia, unspecified: Secondary | ICD-10-CM

## 2010-07-31 DIAGNOSIS — M545 Low back pain: Secondary | ICD-10-CM

## 2010-07-31 MED ORDER — ACETAMINOPHEN 325 MG PO TABS: 650.0000 mg | ORAL_TABLET | ORAL | Status: AC | PRN

## 2010-07-31 MED ORDER — CYCLOBENZAPRINE HCL 10 MG PO TABS: 10.0000 mg | ORAL_TABLET | Freq: Two times a day (BID) | ORAL | Status: DC | PRN

## 2010-07-31 MED ORDER — ALPRAZOLAM 1 MG PO TABS: 1.0000 mg | ORAL_TABLET | Freq: Every evening | ORAL | Status: DC | PRN

## 2010-07-31 MED ORDER — IBUPROFEN 600 MG PO TABS: 600.0000 mg | ORAL_TABLET | Freq: Four times a day (QID) | ORAL | Status: AC

## 2010-07-31 NOTE — Assessment & Plan Note (Signed)
Last cholesterol levels under good control in August 11. We'll recheck an upcoming August until then continue the current medications.

## 2010-07-31 NOTE — Assessment & Plan Note (Signed)
His ICD placed in October 2010. Stable cardiac condition at this point of time. He's been followed by Dr. Johney Frame for his cardiac followup.

## 2010-07-31 NOTE — Patient Instructions (Signed)
Followup appointment in 2-3 weeks. please take Tylenol and Advil as prescribed for pain daily for next 2 weeks. Also take Flexeril for next 2 weeks. Also try to go around in the house as much as you can rather than lying in the bed.

## 2010-07-31 NOTE — Progress Notes (Signed)
  Subjective:    Patient ID: John Davenport, male    DOB: 05/27/1975, 35 y.o.   MRN: [id number]  HPI John Davenport is a 35 year old gentleman who was per history of large anterior wall MI in 2009 status post PCI and bare metal stent placement and revascularization and his ICD who comes with a complaint of severe low back pain for about 3 weeks now. John Davenport was his usual health before  3 weeks he woke up one morning and had low back pain which is located in the lumbosacral spine, without any radiation, sharp shooting, 8-9/10. The pain does not go to groin or to the legs and also doesn't have any urinary incontinence or groin anesthesia. He does complain of some paraspinal lumbar muscle stiffness along with the pain. He stays in bed almost all day for last 3 weeks due to the pain and move around a little in-house if any. Denies any fever, chills, chest pain, abdominal pain, cough, palpitations, headache, diarrhea.    Review of Systems    as per history of present illness Objective:   Physical Exam    Constitutional: Vital signs reviewed.  Patient is a well-developed and well-nourished in no acute distress and cooperative with exam. Alert and oriented x3.  Head: Normocephalic and atraumatic Ear: TM normal bilaterally Mouth: no erythema or exudates, MMM Eyes: PERRL, EOMI, conjunctivae normal, No scleral icterus.  Neck: Supple, Trachea midline normal ROM Cardiovascular: RRR, S1 normal, S2 normal. Pulmonary/Chest: CTAB, no wheezes, rales, or rhonchi Abdominal: Soft. Non-tender, non-distended, bowel sounds are normal, no masses, organomegaly, or guarding present.  GU: no CVA tenderness Musculoskeletal: No significant spinal tenderness all over, mild paraspinal muscle stiffness present at lumbar region. No paraspinal tenderness. SLR negative .  Neurological: A&O x3, Strenght is normal and symmetric bilaterally, cranial nerve II-XII are grossly intact, no focal motor deficit, sensory intact to  light touch bilaterally.  Skin: Warm, dry and intact. No rash, cyanosis, or clubbing.  Psychiatric: Normal mood and affect. speech and behavior is normal. Judgment and thought content normal. Cognition and memory are normal.       Assessment & Plan:

## 2010-07-31 NOTE — Assessment & Plan Note (Signed)
Back pain is most likely lumbosacral strain or pain muscular strain from sleep position. His been stable and not getting worse. Examination doesn't seem to be supported for any neurological involvement. Also he doesn't need imaging at this point considering 3 weeks period of pain. Explained him about the diagnosis and prescribed him ibuprofen 600 mg, scheduled, by mouth 4 times daily for [redacted] weeks along with Tylenol 650 mg by mouth every 4 hours as needed for pain.  Also started him on Flexeril 10 mg by mouth twice a day as needed for pain. We'll follow up in 2 weeks and if it is still the same or getting worse we'll consider imaging to start with plain film.

## 2010-08-13 NOTE — Discharge Summary (Signed)
NAME:  John Davenport, John Davenport          ACCOUNT NO.:  [id number]   MEDICAL RECORD NO.:  [id number]          PATIENT TYPE:  INP   LOCATION:  2040                         FACILITY:  [facility]   PHYSICIAN:  Francisca December, M.D.  DATE OF BIRTH:  1975-09-08   DATE OF ADMISSION:  01/29/2008  DATE OF DISCHARGE:  02/02/2008                               DISCHARGE SUMMARY   ADDENDUM   Because the patient had transient elevation of his LFTs, I spoke with  the pharmacist and we have decided to change him from Crestor over to  pravastatin 20 mg a day.  He will need to have LFTs checked as an  outpatient to assure there are downward trend given his myocardial  infarction.      [name], P.A.      Francisca December, M.D.  Electronically Signed    LB/MEDQ  D:  02/02/2008  T:  02/03/2008  Job:  [id number]

## 2010-08-13 NOTE — H&P (Signed)
HISTORY AND PHYSICAL EXAMINATION   March 26, 2009   Re:  John Davenport, John Davenport          DOB:  1975/10/29   REASON FOR CONSULTATION:  Single-vessel coronary artery disease.   HISTORY OF PRESENT ILLNESS:  The patient is Davenport 35 year old male with  history of coronary artery disease dating back to October 2009, at which  time he suffered Davenport large anterior wall myocardial infarction.  The  patient was treated with percutaneous coronary intervention and  placement of bare metal stent.  The patient was noted to have severe  ischemic cardiomyopathy with ejection fraction 30-35% at that time.  He  gradually recovered.  In June 2010, the patient was rehospitalized in  [place] with another acute myocardial infarction.  The patient had  percutaneous coronary intervention with balloon angioplasty of the  reoccluded stent in the proximal left anterior descending coronary  artery.  Repeat stent placement was not performed.  The patient was told  that he should have bypass surgery at that time, but the patient left  the hospital against medical advice and returned to [facility].  He was  rehospitalized in [place] shortly thereafter and repeat  catheterization was performed by Dr. Amil Amen.  The left anterior  descending coronary artery remained patent with 30% in-stent restenosis.  Left ventricular ejection fraction was estimated at 30-35%.  [place] decision  was made at that time to treat the patient medically rather than refer  him for surgical intervention.  Ultimately, he was referred for  placement of an implantable defibrillator in October 2010 for secondary  prevention due to the presence of persistent severe ischemic  cardiomyopathy with ejection fraction 25-30%.  The patient returned with  recurrent symptoms of exertional shortness of breath and fatigue over  the last month or so.  He was evaluated by Dr. Mayford Knife and subsequently  brought on for followup catheterization  on March 08, 2009.  The  patient was found to have reocclusion of the proximal left anterior  descending coronary artery stent with otherwise no significant  progression of disease in either the left circumflex or the right  coronary artery territories.  The patient was kept in the hospital and Davenport  viability study was performed on March 14, 2009.  The patient was  found to have fixed defect in the anterior wall consistent with previous  transmural myocardial infarction, but there was evidence for some  reversibility with viability particularly in the inferior septum.  The  patient has now been referred to consider elective surgical  revascularization.   REVIEW OF SYSTEMS:  GENERAL:  The patient reports progressive fatigue.  He has been out of work since his heart attack in 2009.  He has normal  appetite.  He is 6 feet 3 inches tall, weighs 250 pounds.  CARDIAC:  Notable for progressive symptoms of exertional shortness of  breath as well as orthopnea over the last month or so.  The patient has  intermittent episodes of burning epigastric pain which he described as  heartburn but is similar to symptoms that he had suffered at the time of  his previous myocardial infarctions.  He has not had any severe pain  since his heart attack that occurred last June.  He denies any lower  extremity edema.  RESPIRATORY:  Notable for persistent cough that is typically productive  of blackish sputum.  The patient denies hemoptysis.  GASTROINTESTINAL:  Notable for an episode of bloody mucousy stool  approximately 2 weeks  ago.  This appears to have resolved and has not  recurred.  The patient denies any diarrhea or constipation.  He has no  difficulty swallowing.  GENITOURINARY:  Negative.  PERIPHERAL VASCULAR:  Negative.  HEENT:  Notable for poor dentition with history of dental caries for  which he was seen in the emergency department in September 2010.  He  apparently was given prescription for  oral penicillin and subsequently  clindamycin and he has remained on antibiotics ever since.  He never has  his tooth pulled.  He has thought that he has been on these antibiotics  for treatment of Davenport cold, although there is no record of this in the  patient's past medical history.  HEMATOLOGIC:  Negative.  PSYCHIATRIC:  Notable for history of anxiety and depression.   PAST MEDICAL HISTORY:  1. Coronary artery disease status post anterior wall myocardial      infarction in October 2009, status post reocclusion of stent in the      left anterior descending coronary artery in June 2010 status post      reocclusion documented on recent catheterization 3 weeks ago.  2. Ischemic cardiomyopathy.  3. Status post placement of implantable defibrillator for primary      prevention (defibrillator has never fired).  4. Hypercholesterolemia.  5. Longstanding tobacco abuse.  6. History of substance abuse in the distant past.   FAMILY HISTORY:  Notable in that the patient's father suffered his first  heart attack at age 49 and ultimately died of coronary artery disease at  age 88.   SOCIAL HISTORY:  The patient is single and lives in [place].  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  He has Davenport longstanding history of tobacco abuse  and he continues to smoke half Davenport pack of cigarettes per day.  He has Davenport  history of illicit drug use in the remote past, but he quit 9 years ago,  and he quit drinking completely 8 years ago.   CURRENT MEDICATIONS:  1. Aspirin 325 mg daily.  2. Simvastatin 40 mg daily.  3. Enalapril 5 mg twice daily.  4. Metoprolol 25 mg twice daily.  5. Xanax as needed.  6. Nitroglycerin sublingual as needed.  7. Clindamycin 300 mg three times daily.  8. Penicillin VK 500 mg three times daily.  9. Diclofenac 50 mg daily as needed for pain.   DRUG ALLERGIES:  None known.   PHYSICAL EXAMINATION:  The patient is mildly  obese white male who  appears somewhat older than stated age but in no acute distress.  Blood  pressure is 129/80, pulse 94, oxygen saturation 96% on room air.  HEENT  exam is notable for poor dentition.  The neck is supple.  There is no  palpable lymphadenopathy.  There are no carotid bruits.  Auscultation of  the chest reveals clear breath sounds.  No wheezes or rhonchi noted.  Cardiovascular exam includes regular rate and rhythm.  No murmurs, rubs,  or gallops are noted.  The abdomen is mildly obese but soft, nontender.  There are no palpable masses.  The extremities are warm and well  perfused.  There is no lower extremity edema.  Pulses are palpable.   DIAGNOSTIC TESTS:  Cardiac catheterization performed on March 08, 2009, by Dr. Mayford Knife is reviewed.  This demonstrates subtotal occlusion  of the proximal left anterior descending coronary artery at the site  of  previous stent placement.  There is sluggish left to left collateral  filling and trickle antegrade flow filling the distal vessel.  There is  no significant disease in the left circumflex or the right coronary  artery territories.  There is severe left ventricular dysfunction with  ejection fraction estimated 30%.   IMPRESSION:  Severe single-vessel coronary artery disease with ischemic  cardiomyopathy and subtotal occlusion of the left anterior descending  coronary artery at the site of previous stent placement.  Recent  viability study suggest that although there is Davenport large fixed defect  involving the anterior wall, there is some peri-infarct viability.  As  such this relatively young patient might benefit from surgical  revascularization.  The risks of surgery will be somewhat elevated due  to the presence of severe underlying left ventricular dysfunction as  well as continued ongoing tobacco abuse.  However, overall I suspect the  risks should be acceptably low.  The patient has been taking oral  clindamycin and  penicillin for several months without any medical  followup.  It sounds as though this was originally prescribed due to the  presence of dental caries, although the patient is confused about this  and never sought any followup.  He had Davenport brief episode of mucousy bowel  movement several weeks ago associated with gross hematochezia.  This  could potentially be related to Clostridium difficile colitis or  overgrowth of his underlying bacteria due to longstanding continued  antibiotic use without clear indication.   PLAN:  I have instructed the patient to stop taking clindamycin and  penicillin immediately.  I have instructed him to see his dentist to  have his teeth looked at and taken care of as soon as possible.  I have  instructed him to contact us if he has any more problems with his bowels  or any further episodes of hematochezia.  I have also instructed him to  stop smoking immediately and I have given him Davenport prescription for Davenport  nicotine patch to help.  We will tentatively plan to proceed with  elective surgery on Wednesday, January 12.  We will see the patient back  in the office on Monday, January 10.  All of his questions have been  addressed.    Salvatore Decent. Cornelius Moras, M.D.  Electronically Signed   CHO/MEDQ  D:  03/26/2009  T:  03/27/2009  Job:  [id number]

## 2010-08-13 NOTE — H&P (Signed)
NAME:  John Davenport, John Davenport          ACCOUNT NO.:  [id number]   MEDICAL RECORD NO.:  [id number]          PATIENT TYPE:  INP   LOCATION:  2919                         FACILITY:  [facility]   PHYSICIAN:  Corky Crafts, MDDATE OF BIRTH:  28-Jul-1975   DATE OF ADMISSION:  09/17/2008  DATE OF DISCHARGE:                              HISTORY & PHYSICAL   REFERRING PHYSICIAN:  Emergency Room, Azalia Bilis, MD   HISTORY OF PRESENT ILLNESS:  The patient is a 35 year old man who had an  anterior MI in October 2009.  He was treated by Dr. Amil Amen at that  time.  He had an ejection fraction of 30%.  He also smoked at that time.  He was started on congestive heart failure medicines.  He was in [place], working, and on Monday, began to feel some nausea, vomiting,  and lightheadedness.  He took it easy and then again felt the same  symptoms on Tuesday.  By Wednesday, the symptoms were so severe.  He  went to the emergency room and was told he was having a heart attack.  He went emergently for a cardiac catheterization.  According to the  records, he had an occluded ostial LAD that was the area that was  stented back in October 2009.  This was opened with angioplasty, but  felt to not be a stent candidate.  He was referred for bypass surgery  and an intra-aortic balloon pump was placed.  He wanted a second opinion  and subsequently left the hospital AMA when his friend who was coming  back to [place].  He left the hospital earlier this morning and drove  back down to [place].  He came immediately to the emergency room.  He  is having some left lower abdominal pain that at times radiates to his  chest.  It has been happening for 12 hours.  It was relieved with  morphine, heparin, and nitroglycerin.  It does not feel like his prior  heart attack.  He would like to have an opinion from Dr. Amil Amen before  agreeing to any type of surgery.   PAST MEDICAL HISTORY:  1. Prior anterior MI.  2.  Prior cocaine use.   PAST SURGICAL HISTORY:  None.   SOCIAL HISTORY:  He smokes about 1/2 pack per day, down from 2 packs a  day.   FAMILY HISTORY:  His father had an MI at age 60 and bypass at age 14.   MEDICATIONS:  Aspirin, simvastatin, Vasotec, and metoprolol.   ALLERGIES:  No known drug allergies.   REVIEW OF SYSTEMS:  Left upper quadrant pain and left lower chest pain,  worse with breathing in and out.  No leg swelling or bleeding problems.  He has some right groin soreness from the cath site.  No focal weakness.  All other systems negative.  He has been mildly constipated as well.   PHYSICAL EXAMINATION:  VITAL SIGNS:  115/70, pulse 78.  GENERAL:  He is awake, alert, in no apparent distress.  HEAD:  Normocephalic, atraumatic.  EYES:  Extraocular movements intact.  NECK:  No JVD.  No carotid  bruits.  CARDIOVASCULAR:  Regular rate and rhythm.  S1, S2.  LUNGS:  Clear to auscultation bilaterally.  ABDOMEN:  Soft, nontender, and nondistended.  EXTREMITIES:  No edema.  NEUROLOGIC:  No focal deficits.  GENITOURINARY:  Right groin without hematoma.   LABORATORY WORK:  A white count of 14.4, hemoglobin 14.1.  Creatinine  1.2, BNP 428, troponin less than 0.05.  ECG shows normal sinus rhythm  with prior anterior infarction.  No acute ST-T wave changes.   ASSESSMENT AND PLAN:  1. History of myocardial infarction with bare-metal stent placed back      to ostial left anterior descending in October 2009.  He apparently      has had restenosis of the bare-metal stent and subsequent repeat      infarction with successful percutaneous transluminal coronary      angioplasty to that area.  We need to get the cath films to see if      the patient would benefit from CABG.  2. We will start on heparin now.  Continue aspirin, beta-blocker, ACE      inhibitor, and statin.  He has been on Plavix and he took his last      dose this morning.  3. He needs to stop smoking.  4. LDL target less  than 70.  We will check lipids.  5. He appears to be in some mild congestive heart failure.  His      ejection fraction was about 30% at the time of his heart attack in      October 2009.  Will repeat ultrasound.      Corky Crafts, MD  Electronically Signed     JSV/MEDQ  D:  09/17/2008  T:  09/18/2008  Job:  [id number]

## 2010-08-13 NOTE — Assessment & Plan Note (Signed)
OFFICE VISIT   RIGGINS, CISEK A  DOB:  04-May-1975                                        May 07, 2009  CHART #:  [id number]   HISTORY:  The patient is a 35 year old male with history of coronary  artery disease dating back to 2009.  His history is well documented per  the chart, but he is seen in the office today in follow up for coronary  artery bypass grafting x1 with a left internal mammary artery to the  distal left anterior descending coronary artery done by Dr. Cornelius Moras on  April 13, 2009.  Currently, the patient states he is feeling better  with more endurance and energy.  His pain is improving, but he still  does have some moderate sternal discomfort.  He is taking narcotic pain  medicine on an intermittent basis.  He did complain of some sternal  instability with a popping sensation but this has resolved.  He has some  shoulder discomfort that he states is related to overexertion and a  pulled muscle in his shoulder.  He denies fevers, chills, or other  constitutional symptoms.  He has not been seen in followup by his  cardiologist at this point.  He does report some occasional dizziness  when going from a sitting to a standing position.   DIAGNOSTIC TESTS:  Chest x-ray was obtained on today's date.  It shows  no active disease.  The pacemaker defibrillator appears to be positioned  well.   PHYSICAL EXAMINATION:  VITAL SIGNS:  Blood pressure is 100/80, pulse is  69, respirations 18, oxygen saturation is 98% on room air.  GENERAL:  Well-developed adult male in no acute distress.  PULMONARY:  Clear breath sounds throughout.  CARDIAC:  Regular rate and rhythm.  Normal S1 and S2.  No gallops,  murmurs, or rubs.  EXTREMITIES:  No edema.  Incisions are healing well without evidence of  infection.   ASSESSMENT:  The patient is continuing to make adequate progress.  I  have encouraged him to follow up with his cardiologist and he states he  will make an appointment to see them.  He will also discuss his  intermittent dizziness as he may require further evaluation with  possible Holter monitoring or possible further testing if the dizziness  continues to be a problem.  The patient does not drive.  He has no  license.  We discussed increasing activities in cardiac rehabilitation  phase II.  He plans to discuss this also with Cardiology as well as long-  term management of his cardiology issues.  We will see him again on a  p.r.n. basis if any surgical issues present or as requested. I did give  him a prescription refill for Percocet 30 tablets 1 every 4-6 hours  p.r.n. pain.   Rowe Clack, P.A.-C.   Sherryll Burger  D:  05/07/2009  T:  05/08/2009  Job:  [id number]   cc:   Armanda Magic, M.D.

## 2010-08-13 NOTE — Discharge Summary (Signed)
NAMESTANDLEY, BARGO          ACCOUNT NO.:  [id number]   MEDICAL RECORD NO.:  [id number]          PATIENT TYPE:  INP   LOCATION:  2508                         FACILITY:  [facility]   PHYSICIAN:  Francisca December, M.D.  DATE OF BIRTH:  May 02, 1975   DATE OF ADMISSION:  09/17/2008  DATE OF DISCHARGE:  09/20/2008                               DISCHARGE SUMMARY   DISCHARGE DIAGNOSES:  1. Coronary artery disease status post recent anterior myocardial      infarction.  2. Ischemic cardiomyopathy, ejection fraction 30%.  3. Dyslipidemia, treated.  4. Tobacco abuse, smoking cessation counseling.  5. Early family history of coronary artery disease.  6. Long-term medication use.  7. Leukocytosis, probably mostly reactive, but he does have tooth      abscess.  8. Tooth abscess.   John Davenport is a 35 year old male patient who has had a previous stent  placement to the LAD.  He suffered myocardial infarction in October  2009.  His resultant EF was 30%.  He was started on appropriate medical  therapy at that time.   Recently, he was hospitalized at [place].  He states he had  another heart attack and from what I can gather from the records from  [facility] at [place], he had another anterior myocardial  infarction, had ostial LAD lesion and this was angioplastied to open.  I  am not sure how sick he was but I do know that an intra-aortic balloon  pump was used.  Somehow, he left AMA and while he was there he was told  that he needed coronary artery bypass grafting, he left.  He wanted here  to see his own doctor and he wanted the second opinion.   The patient was placed in the Intensive Care Unit and we requested the  CD disc of his catheterization.  He was sent to [facility] where we  evaluated and felt like we should take the patient back to the cardiac  catheterization suite.   The catheterization showed his EF was around 30-35% with anterior  akinesis.  He had 30% in-stent  restenosis of the proximal LAD lesion,  30% proximal lesion in the second diagonal and a 40% proximal lesion in  the LAD at the bifurcation of the second diagonal.  There did not appear  to be obstructive disease to the point where he required any more  vascularization such as bypass surgery.  We felt that he needed adequate  medical therapy for LV dysfunction and we need to consider ICD placement  for prevention of sudden death.  We can sort these issues out as an  outpatient.   Lab studies during the patient's hospitalization included a white count  which is decreasing at 12.7, hemoglobin 13.5, hematocrit 40.3, and  platelets 205.  His CK-MB and troponin ended up being normal.  His  sodium was 138, potassium 4.1, BUN 8, and creatinine 1.17.  His AST was  minimally elevated at 48 with an ALT of 136.  His lipase was normal.  His urine drug screen was positive for opiates.  His chest x-ray showed  some  increase in left lung discoid atelectasis, otherwise negative.   His EKG showed poor R-wave progression in the anterior leads with  intraventricular conduction delay, otherwise normal sinus rhythm.   Of note, the patient had been treated at [facility] for a  tooth abscess, so we continued treatment while he was here.  This may be  some of the reason that his white blood cell count is elevated.   DISCHARGE MEDICATIONS:  1. Enteric-coated aspirin 325 mg a day.  2. Vasotec 5 mg every 12 hours.  3. Metoprolol 25 mg p.o. b.i.d.  4. Clindamycin 300 mg q.8 h x5 more days.  5. Zocor 40 mg at bedtime.  6. NicoDerm patch 14 mg per hour daily for 2 weeks, then decrease to 7      mg per hour x2 weeks and then stop.  He can pick this up at a local      drug store.  7. Xanax 0.25 mg 1 p.o. q.12 h p.r.n. anxiety.   He is being discharged home in stable, but improved condition.  He is to  remain on low-sodium, heart-healthy diet.  Increase activity slowly.  No  lifting over 10 pounds  for 1 week.  No driving for 2 days.  Activity as  per cardiac rehabilitation.  Clean cath site gently with soap and water.  No scrubbing.  Follow up with Tammy/Dr. Amil Amen on October 03, 2008, at  11:15 a.m.   ADDENDUM:  The patient has been seen at [facility] for  his primary care services, but has had problems with Medicaid recently  and is trying to reapply.  I have told him he needs to try to get back  in with them as soon as possible.      [name], P.A.      Francisca December, M.D.  Electronically Signed    LB/MEDQ  D:  09/20/2008  T:  09/21/2008  Job:  [id number]

## 2010-08-13 NOTE — Assessment & Plan Note (Signed)
OFFICE VISIT   John Davenport, John Davenport  DOB:  06/21/1975                                        April 09, 2009  CHART #:  [id number]   HISTORY:  The patient returns to the office today for further followup  of single-vessel coronary artery disease.  He was originally seen in  consultation on March 26, 2009, and Davenport full history and physical exam  was dictated at that time.  Since then, his bowels have cleared up and  he has managed to quit smoking by his report.  He claims that he has not  been smoking any cigarettes at all.  He states that he does feel  somewhat anxious and jittery but overall he is getting along okay.  He  desires to go ahead and proceed with surgery.  We again reviewed the  indications, risks, and potential benefits of coronary artery bypass  grafting.  All of his questions have been addressed.  We plan to proceed  with surgery on Friday, January 14.  He understands and accepts all  potential associated risks of surgery including but not limited to risk  of death, stroke, myocardial infarction, congestive heart failure,  respiratory failure, renal failure, pneumonia, bleeding requiring blood  transfusion, arrhythmia, infection, recurrent coronary artery disease.  All of his questions have been addressed.   Salvatore Decent. Cornelius Moras, M.D.  Electronically Signed   CHO/MEDQ  D:  04/09/2009  T:  04/10/2009  Job:  [id number]   cc:   Armanda Magic, M.D.

## 2010-08-13 NOTE — Discharge Summary (Signed)
John Davenport, John Davenport          ACCOUNT NO.:  [id number]   MEDICAL RECORD NO.:  [id number]          PATIENT TYPE:  INP   LOCATION:  2040                         FACILITY:  [facility]   PHYSICIAN:  Francisca December, M.D.  DATE OF BIRTH:  Aug 28, 1975   DATE OF ADMISSION:  01/29/2008  DATE OF DISCHARGE:  02/02/2008                               DISCHARGE SUMMARY   DISCHARGE DIAGNOSES:  1. Acute lateral wall myocardial infarction.  2. Ischemic cardiomyopathy, ejection fraction 30%.  3. Polysubstance abuse.  4. Dyslipidemia.  5. Family history of coronary artery disease.   HOSPITAL COURSE:  John Davenport is a 35 year old male patient, who  presented to the emergency room with anterior wall ST-segment elevation  on January 29, 2008.  He was watching a movie and developed sudden  severe substernal chest pain that radiated to his left shoulder.  He  came to the emergency room and had anterior ST-segment elevation and was  taken directly to the catheterization lab.   The LAD was occluded at the origin, but ultimately a bare-metal stent  was implanted to this area.  The patient suffered an acute lateral wall  myocardial infarction with flush occlusion of the LAD left main with  distal LAD diagonal embolization.  He was then placed in the intensive  care unit.   A 2-D echo the following day showed a markedly decreased LV systolic  function with EF of 11%-91% with akinesis of the entire periapical wall  as well as the septal wall and mid distal anterior wall.   The patient had urine drug screen that did show benzodiazepine, opiates,  and cocaine.  Other lab studies include maximum troponin of greater than  100, total CK was 26, 93 with 398 MB fractions, hemoglobin 13.6,  hematocrit 41.5, white count stress elevation of 17.6, and platelets  204.  Total cholesterol 152, triglycerides 75, HDL 37, and LDL 100.  SGOT 164, ALT 61 that was on admission and then the second liver panel  showed an AST of  213, ALT of 74, transient elevation felt secondary to  MI.   The patient was refered to  cardiac rehabilitation during his  hospitalization.  By February 02, 2008, we felt it was safe for him to go  home.  He was able to ambulate without difficulty.   DISCHARGE MEDICATIONS:  1. Enteric-coated aspirin 325 mg a day.  2. Plavix 75 mg a day.  3. Crestor 40 mg a day.  4. NicoDerm CQ patch 14 mg per 24 hours for 2 weeks, then 7 mg per 24      hours for 2 weeks, then stop.  5. Vasotec 5 mg q.12 h.  6. Lopressor 25 mg one-half tablet p.o. b.i.d.   The patient is to remain on a low-sodium, heart-healthy diet.  Clean  cath site gently with soap and water.  No scrubbing.  Activity as per  cardiac rehabilitation.  Follow up with Dr. Deitra Mayo, nurse  practitioner on February 17, 2008, at 9:50 a.m.      John Davenport, P.A.      Francisca December, M.D.  Electronically Signed  LB/MEDQ  D:  02/02/2008  T:  02/03/2008  Job:  [id number]

## 2010-08-13 NOTE — Cardiovascular Report (Signed)
NAMEJAVIER, Davenport          ACCOUNT NO.:  [id number]   MEDICAL RECORD NO.:  [id number]          PATIENT TYPE:  INP   LOCATION:  2040                         FACILITY:  [facility]   PHYSICIAN:  Francisca December, M.D.  DATE OF BIRTH:  January 04, 1976   DATE OF PROCEDURE:  01/29/2008  DATE OF DISCHARGE:                            CARDIAC CATHETERIZATION   PROCEDURES PERFORMED:  1. Left heart catheterization.  2. Coronary angiography.  3. percutaneous coronary intervention/bare-metal stent implantation,      proximal left anterior descending.  4. Left ventriculogram.  5. Percutaneous closure, right femoral artery.   INDICATIONS:  [name] is a 35 year old man who is  presented to [facility] Emergency Room with acute anterior wall myocardial  infarction.  He is brought to the catheterization laboratory in an  urgent basis for percutaneous revascularization.   PROCEDURE NOTE:  The patient was brought to the cardiac catheterization  laboratory in a fasting state.  The right groin was prepped and draped  in usual sterile fashion.  Local anesthesia was obtained with the  infiltration of 1% lidocaine.  A 6-French catheter sheath was inserted  percutaneously into the right femoral artery utilizing an anterior  approach over guiding J-wire.  A 6-French #4 right Judkins catheter was  then advanced to the ascending aorta where the right coronary os was  engaged and cineangiography was performed in LAO and RAO projection.  The right coronary catheter was then removed and the 6-French catheter  sheath exchanged over the long-guiding J-wire for 7-French catheter  sheath.  A 7-French number 3.5 ACLS guiding catheter was then advanced  to the left coronary os where the left main was cannulated and  cineangiography of the left coronary artery was performed in LAO and RAO  projections.  The left anterior descending artery was noted to be flush  occluded at its origin and there did appear to be  evidence of distal  embolization into a small left circumflex marginal branch.  The patient  had received 5000 units of heparin in the emergency room and an ACT was  obtained and found to be 190 seconds.  The patient was then given  additional 3000 units of heparin intravenously.  He also received a  double bolus and constant infusion of Integrilin.  Subsequent ACT drawn  was 259 seconds.  The left anterior descending artery was cannulated  with 0.014 inch Luge intracoronary guidewire.  Initial balloon  dilatation of the lesion was performed with a 3.0/20 mm apex  intracoronary balloon.  This was inflated of 4 atmospheres for 60  seconds.  Balloon was deflated and removed and angiography identified  patent LAD but with TIMI grade to distal flow and extensive embolization  of thrombus, especially into the midportion of the LV and into the  proximal portion of a large branching diagonal.  Therefore, a  thrombectomy patch catheter was advanced into the coronary and a single  pass performed with 20 mL of blood aspirated.  This maneuver resulted in  resolution of thrombus in the midportion of the LAD and in the proximal  portion of the diagonal branch, however, there are  persisted evidence of  distal embolization into the distal portion of the diagonal branches and  the LAD.  As well, there was contrast thinning and embolization into the  third marginal branch.  I then infuse 60 mcg of adenosine into the left  coronary via the guiding catheter.  Additionally 22.5 mg of ReoPro were  infused intravenously.  This did not substantially change the appearance  of the distal vessel.  TIMI flow were grade II.  There was some  improvement in the distal diagonal branch.   I then proceeded with stent implantation in the proximal LAD.  A 3.5/18-  mm Multilink Vision intracoronary stent was advanced and placed  carefully positioned and deployed at peak pressure of 12 atmospheres for  25 seconds.  The  balloon was deflated and removed and a 3.5/15 mm Scimed  Quantum Maverick intracoronary balloon was advanced in place and  inflated into position with a maximum pressure of 60 atmospheres for 40  seconds.  I then placed the second 0.014 inches Luge wire into the  marginal branch with plans to balloon to the distal portion because of  the embolization.  However, with wire manipulation alone and the  antithrombotic therapy applied, the distal circumflex marginal, which  was small, reopened spontaneously.  Attempts to mechanically disrupt  thrombus in the diagonal branch using the wire were essentially  unsuccessful.  I terminated the procedure at that point and withdrew the  guiding catheter.  A 6-French pigtail catheter was then used to perform  a left ventriculogram.  This was done in a 30-degree RAO angulation.  A  42-mL were injected at 13 mL per second.  Pressure was recorded during  pullback of the pigtail catheter across the aortic valve.  The pigtail  catheter was then removed.  A right femoral arteriogram in a 45-degree  RAO angulation documented adequate anatomy for placement of percutaneous  closure device Angio-Seal.  This was subsequently deployed with good  hemostasis and an intact distal pulse.  A small filling defect was seen  in the common femoral artery at the bifurcation into the profunda  femoral and superficial femoral arteries.  Ongoing flow appeared to be  adequate.  Therefore, this was not further treated.   The patient was then transported to the coronary care unit in stable  condition with an intact distal pulse.   HEMODYNAMIC RESULTS:  Systemic arterial pressure was 130/89 with a mean  of 107 mmHg.  There was no systolic gradient across the aortic valve.  The left ventricular end-diastolic pressure was 26 mmHg.   ANGIOGRAPHY:  As mentioned the lesion treated was in the proximal  portion of the left anterior descending artery.  It was completely  occluded and  highly thrombotic.  Following balloon dilatation and stent  implantation, there was no residual stenosis.  There was distal  embolization in both the medial and lateral subbranch of the diagonal  and in the distal LAD as mentioned.  The right coronary artery was  widely patent and did have an anterior origin off of the aortic root.   The left ventriculogram demonstrated anterolateral apical and  inferoapical akinesis.  A visual estimate of the ejection fraction is in  the range of 30%.   FINAL IMPRESSION:  1. Atherosclerotic coronary vascular disease, single vessel.  2. Thrombotic occlusion at the origin of the left anterior descending      artery with subsequent distal embolization.  3. Successful percutaneous coronary intervention with bare-metal stent  implantation of ostial and proximal left anterior descending.  4. Moderate left ventricular systolic dysfunction with regional wall      motion abnormalities as noted.      Francisca December, M.D.  Electronically Signed     JHE/MEDQ  D:  02/03/2008  T:  02/04/2008  Job:  [id number]

## 2010-08-13 NOTE — H&P (Signed)
John Davenport, John Davenport NO.:  [id number]   MEDICAL RECORD NO.:  [id number]          PATIENT TYPE:  INP   LOCATION:  2307                         FACILITY:  [facility]   PHYSICIAN:  Marca Ancona, MD      DATE OF BIRTH:  1975/09/22   DATE OF ADMISSION:  01/29/2008  DATE OF DISCHARGE:                              HISTORY & PHYSICAL   The patient has no primary cardiologist.   HISTORY OF PRESENT ILLNESS:  This is a 35 year old with no significant  prior medical history, who presents tonight with anterior ST-elevation  MI.  The patient states he was watching a movie when he developed sudden  severe substernal chest pain.  The pain was a tightness, it radiated to  his left shoulder.  He had never had similar pain.  The pain was 10/10.  He has no history of chest pain.  He states that several weeks ago he  did have a few days of feeling some fairly nonspecific dyspnea, but that  had completely resolved.  The patient came to the emergency department  tonight.  He was noted to have anterior ST elevations.  He went to the  Cath Lab.  In the cath lab, briefly it was found that the LAD was  occluded at the origin.  The patient had bare mental stenting of his  proximal LAD after thrombus aspiration.  There was distal embolization  with occlusion of the distal LAD as well as the second and third  diagonals.  There was TIMI II-III flow at the completion of procedure.  EF was 30% on ventriculogram.  Post-PCI, the patient continues to have  some mild 2-3/10 chest discomfort.   PAST MEDICAL HISTORY:  None.   MEDICATIONS:  None.   SOCIAL HISTORY:  The patient rarely drinks alcohol.  He smokes 1 pack a  day of cigarettes.  He works as a Curator.  He denies illicit drugs.   FAMILY HISTORY:  The patient's father had a myocardial infarction at the  age of 16.  He had bypass surgery at 35.   REVIEW OF SYSTEMS:  Negative except as noted in the history of present  illness.   PHYSICAL  EXAMINATION:  VITAL SIGNS: Afebrile, heart rate 83 and regular,  blood pressure 140/96, and O2 sat 98% on 2 liters nasal cannula.  GENERAL:  This is a young male in no apparent distress.  NEUROLOGIC:  Alert and oriented x3.  Normal affect.  NECK:  JVP 8 cm.  No thyromegaly or thyroid nodule.  LUNGS:  Clear to auscultation bilaterally with normal respiratory  effort.  CARDIOVASCULAR:  Heart regular.  S1 and S2.  No murmur.  No S3.  No S4.  There is no edema.  There are 2+ posterior tibial pulses bilaterally.  ABDOMEN:  Soft and nontender.  No hepatosplenomegaly.  Bowel sounds are  present.  HEENT:  Normal.  SKIN:  Normal.  MUSCULOSKELETAL:  Normal.   ASSESSMENT/PLAN:  This is a 35 year old with no significant past medical  history who is now status post anterior myocardial infarction with  proximal left anterior descending occlusion.  He had  percutaneous  coronary intervention to the proximal left anterior descending with bare  mental stenting and also had distal embolization and occlusion of the  distal left anterior descending and the second and third diagonals.  He  does have a history of smoking and premature family history of coronary  artery disease.  1. Coronary artery disease.  The patient is status post anterior      myocardial infarction and percutaneous coronary intervention.  He      does have mild residual chest pain that is likely due to the distal      embolization.  He will continue on eptifibatide for 18 hours.  We      will have him on aspirin 325 mg daily and Plavix 75 mg daily.  He      will be on Crestor 40 mg daily.  We will check lipids in the      morning.  We are going to start him on Coreg 3.125 mg b.i.d.  We      will start low given his depressed ejection fraction on left      ventriculogram.  We will also start him on a nitroglycerin drip and      titrate to achieve pain-free status.  2. Left ventricular function.  The patient had an EF of 30% on left       ventriculogram. status post an anterior myocardial infarction.  We      will have him on Coreg 3.125 mg b.i.d.  We will start lisinopril 5      mg daily as well as eplerenone 25 mg daily given a low ejection      fraction in the setting of an anterior myocardial infarction.  3. The patient does need smoking cessation.  4. We will check a urine drug screen to rule out cocaine abuse.      Marca Ancona, MD  Electronically Signed     DM/MEDQ  D:  01/30/2008  T:  01/30/2008  Job:  [id number]

## 2010-08-13 NOTE — Assessment & Plan Note (Signed)
OFFICE VISIT   DENORRIS, REUST A  DOB:  [birth year]                                        April 25, 2009  CHART #:  [id number]   CURRENT PROBLEMS:  1. Status post off-pump coronary artery bypass graft x1 by Dr. Cornelius Moras on      April 13, 2009.  2. Postoperative atrial fibrillation, transient.   PRESENT ILLNESS:  The patient is a 35 year old Caucasian male who  presents to the office complaining of extreme sternal pain and  hypersensitivity for the past 3 days.  He also states he felt a popping  sensation in the sternal incision during a movement to get out of bed.  He states his oral pain medication (tramadol) has not helped.  He denies  nausea, vomiting, shortness of breath, fever, or drainage.  He is not a  diabetic.   MEDICATIONS:  Amiodarone, metoprolol, and she aspirin, Zocor, Ultram and  Xanax.   PHYSICAL EXAMINATION:  Vital Signs:  Blood pressure 100/70, pulse 80,  respirations 18, saturation 97%.  He is afebrile.  General:  He is a  young white male with a very large frame, anxious, in no distress.  Chest:  Breath sounds are clear.  The sternum is completely stable.  The  sternal incision is healing well.  There is no erythema, cellulitis,  drainage, or instability.  Incision is healing well.  Extremities:  There is no peripheral edema.  Neurologic:  Intact.   PLAN:  The patient is having chest wall pain.  Ultram is not controlling  the pain.  I will put him back on Percocet which he states he tolerated  in the hospital.  I will also give him a course of  Flexeril for pectoralis muscle spasm and some Xanax at night.  He is  encouraged to continue his rehab walking program, and he will keep his  previously scheduled office followup appointment.   Kerin Perna, M.D.  Electronically Signed   PV/MEDQ  D:  04/25/2009  T:  04/26/2009  Job:  [id number]   cc:   Armanda Magic, M.D.

## 2010-08-14 ENCOUNTER — Ambulatory Visit (INDEPENDENT_AMBULATORY_CARE_PROVIDER_SITE_OTHER): Payer: Medicaid Other | Admitting: Internal Medicine

## 2010-08-14 ENCOUNTER — Encounter: Payer: Self-pay | Admitting: Internal Medicine

## 2010-08-14 VITALS — BP 120/82 | HR 101 | Temp 97.2°F | Ht 75.0 in | Wt 263.4 lb

## 2010-08-14 DIAGNOSIS — F419 Anxiety disorder, unspecified: Secondary | ICD-10-CM

## 2010-08-14 DIAGNOSIS — M545 Low back pain: Secondary | ICD-10-CM

## 2010-08-14 DIAGNOSIS — K219 Gastro-esophageal reflux disease without esophagitis: Secondary | ICD-10-CM

## 2010-08-14 DIAGNOSIS — I5023 Acute on chronic systolic (congestive) heart failure: Secondary | ICD-10-CM

## 2010-08-14 DIAGNOSIS — I2589 Other forms of chronic ischemic heart disease: Secondary | ICD-10-CM

## 2010-08-14 DIAGNOSIS — F411 Generalized anxiety disorder: Secondary | ICD-10-CM

## 2010-08-14 DIAGNOSIS — I5022 Chronic systolic (congestive) heart failure: Secondary | ICD-10-CM

## 2010-08-14 DIAGNOSIS — I472 Ventricular tachycardia: Secondary | ICD-10-CM

## 2010-08-14 DIAGNOSIS — I2510 Atherosclerotic heart disease of native coronary artery without angina pectoris: Secondary | ICD-10-CM

## 2010-08-14 MED ORDER — ALPRAZOLAM 1 MG PO TABS: 1.0000 mg | ORAL_TABLET | Freq: Every evening | ORAL | Status: DC | PRN

## 2010-08-14 NOTE — Patient Instructions (Signed)
Please make a f/u appointment in 2-3 months. Please continue taking ibuprofen for 2 more weeks.

## 2010-08-14 NOTE — Progress Notes (Signed)
  Subjective:    Patient ID: John Davenport, male    DOB: April 13, 1975, 35 y.o.   MRN: [id number]  HPI John Davenport is a pleasant 35 year old man with past medical history of CAD, anxiety who came to to the clinic for followup visit for his low back pain . He was seen in clinic on 07/31/2010 4 likely lumbosacral strain and was sent home on scheduled ibuprofen 4 times a day with meals and moving around as much as he can. He is feeling so much better today and drove back from [facility] overnight and still not having a terrible low back pain.  Denies any radiation of the pain to the legs, fever, loss of bowel or bladder control, sensation changes in the legs, weakness in the legs. Also denies any chest pain, short of breath, abdominal pain, cough.     Review of Systems    as per history of present illness Objective:   Physical Exam    Constitutional: Vital signs reviewed.  Patient is a well-developed and well-nourished in no acute distress and cooperative with exam. Alert and oriented x3.  Head: Normocephalic and atraumatic Mouth: no erythema or exudates, MMM Eyes: PERRL, EOMI, conjunctivae normal, No scleral icterus.  Neck: Supple, Trachea midline normal ROM, No JVD, mass, thyromegaly, or carotid bruit present.  Cardiovascular: RRR, S1 normal, S2 normal, no MRG, pulses symmetric and intact bilaterally Pulmonary/Chest: CTAB, no wheezes, rales, or rhonchi Abdominal: Soft. Non-tender, non-distended, bowel sounds are normal, no masses, organomegaly, or guarding present.  Musculoskeletal: No joint deformities, erythema, or stiffness, ROM full and no nontender Neurological: A&O x3, Strenght is normal and symmetric bilaterally, cranial nerve II-XII are grossly intact, no focal motor deficit, sensory intact to light touch bilaterally.  Skin: Warm, dry and intact. No rash, cyanosis, or clubbing.       Assessment & Plan:

## 2010-08-14 NOTE — Assessment & Plan Note (Signed)
He is feeling much better and has good clinical improvement on scheduled ibuprofen along with his regular Nexium. He is still complaining of a mild pain and so advised him to take frozen for next 2 weeks and call the clinic if things get worse or has fever, loss of bowel or bladder control, weakness in the legs. He is agreeable to this plan. Will follow up in 2-3 months for regular visit.

## 2010-08-14 NOTE — Patient Instructions (Signed)
Your physician wants you to follow-up in: 6 months with Dr. Johney Frame. You will receive a reminder letter in the mail two months in advance. If you don't receive a letter, please call our office to schedule the follow-up appointment.  Remote monitoring is used to monitor your Pacemaker of ICD from home. This monitoring reduces the number of office visits required to check your device to one time per year. It allows [place] to keep an eye on the functioning of your device to ensure it is working properly. You are scheduled for a device check from home on 11/14/10. You may send your transmission at any time that day. If you have a wireless device, the transmission will be sent automatically. After your physician reviews your transmission, you will receive a postcard with your next transmission date.  Your physician recommends that you continue on your current medications as directed. Please refer to the Current Medication list given to you today.

## 2010-08-15 ENCOUNTER — Encounter: Payer: Self-pay | Admitting: Internal Medicine

## 2010-08-15 NOTE — Assessment & Plan Note (Signed)
euvolemic on exam today No changes Salt restriction

## 2010-08-15 NOTE — Assessment & Plan Note (Signed)
No ischemic symptoms No changes today 

## 2010-08-15 NOTE — Assessment & Plan Note (Signed)
Improved

## 2010-08-15 NOTE — Progress Notes (Signed)
The patient presents today for routine electrophysiology followup.  Since last being seen in our clinic, the patient reports doing very well.   His indigestion, CP, and SOB have all resolved.  He is no longer smoking.  He feels well.  Today, he denies symptoms of palpitations, chest pain, shortness of breath, orthopnea, PND, lower extremity edema, dizziness, presyncope, syncope, or neurologic sequela.  The patient feels that he is tolerating medications without difficulties and is otherwise without complaint today.   Past Medical History  Diagnosis Date  . Ischemic cardiomyopathy   . Hypercholesteremia   . LBP (low back pain)   . Coronary artery disease     CAD s/p AMI 10/09 and 6/10  . CHF (congestive heart failure)     Ischemic CM (EF 30%) with NYHA Class III CHF  . Back pain    Past Surgical History  Procedure Date  . Cardiac defibrillator placement   . Coronary artery bypass graft     1/11  . Coronary angioplasty with stent placement     LAD stenting 10/09 and re-opening 6/10  . Cardiac defibrillator placement     2010 by [facility]    Current Outpatient Prescriptions  Medication Sig Dispense Refill  . acetaminophen (TYLENOL) 325 MG tablet Take 2 tablets (650 mg total) by mouth every 4 (four) hours as needed for pain.  100 tablet  2  . ALPRAZolam (XANAX) 1 MG tablet Take 1 tablet (1 mg total) by mouth at bedtime as needed.  30 tablet  0  . aspirin 325 MG tablet Take 325 mg by mouth daily.        . carvedilol (COREG) 6.25 MG tablet Take 6.25 mg by mouth 2 (two) times daily with a meal.        . cyclobenzaprine (FLEXERIL) 10 MG tablet Take 1 tablet (10 mg total) by mouth 2 (two) times daily as needed for muscle spasms (Take it twice daily for pain as needed for next 2 weeks).  30 tablet  1  . esomeprazole (NEXIUM) 40 MG capsule Take 40 mg by mouth daily before breakfast.        . fluticasone (FLONASE) 50 MCG/ACT nasal spray 2 sprays by Nasal route daily.        . isosorbide mononitrate  (IMDUR) 30 MG 24 hr tablet Take 30 mg by mouth daily.        Marland Kitchen lisinopril (PRINIVIL,ZESTRIL) 10 MG tablet Take 10 mg by mouth daily.        . simvastatin (ZOCOR) 40 MG tablet Take 20 mg by mouth at bedtime. Take half tablet of 40 mg at bedtime         Allergies  Allergen Reactions  . Oxycodone Hcl     History   Social History  . Marital Status: Single    Spouse Name: N/A    Number of Children: N/A  . Years of Education: N/A   Occupational History  . Not on file.   Social History Main Topics  . Smoking status: Former Smoker    Quit date: 04/04/2009  . Smokeless tobacco: Not on file  . Alcohol Use: No  . Drug Use: Not on file  . Sexually Active:    Other Topics Concern  . Not on file   Social History Narrative   Pt lives with his mom, brother, and 57 yo son in [place].Quit tobacco in Jan. 2011 prior to heart surgery (prior -1/2 PPD (down from 1 ppd per day, onset age  8) alcohol abuse- prior, hasn't drank since age 25no illicit drug use Currently unemployed, awaiting disabilitySingle    Family History  Problem Relation Age of Onset  . Heart attack Father    Physical Exam: Filed Vitals:   08/14/10 1011  BP: 107/81  Pulse: 82  Resp: 14  Height: 6\' 3"  (1.905 m)  Weight: 262 lb (118.842 kg)    GEN- The patient is well appearing, alert and oriented x 3 today.   Head- normocephalic, atraumatic Eyes-  Sclera clear, conjunctiva pink Ears- hearing intact Oropharynx- clear Neck- supple, no JVP Lymph- no cervical lymphadenopathy Lungs- Clear to ausculation bilaterally, normal work of breathing Chest- ICD pocket is well healed Heart- Regular rate and rhythm, no murmurs, rubs or gallops, PMI not laterally displaced GI- soft, NT, ND, + BS Extremities- no clubbing, cyanosis, or edema MS- no significant deformity or atrophy Skin- no rash or lesion Psych- euthymic mood, full affect Neuro- strength and sensation are intact  ICD interrogation- reviewed in detail  today,  See PACEART report  Assessment and Plan:

## 2010-08-15 NOTE — Assessment & Plan Note (Signed)
Stable Normal ICD function See Arita Miss Art report No changes today

## 2010-08-21 ENCOUNTER — Telehealth: Payer: Self-pay | Admitting: *Deleted

## 2010-08-21 NOTE — Telephone Encounter (Signed)
Pt calls very angry because he cannot get xanax filled. i called [facility] and spoke to pharmacist, pt got #120 4/27 .5mg  and now wanted to fill #30 1mg , she faxed a printout and it was given to dr patel. i spoke w/ dr Aundria Rud and patel. Pt will be seen tomorrow by dr patel, appt scheduled by chilonb. For 1015.

## 2010-08-22 ENCOUNTER — Encounter: Payer: Self-pay | Admitting: Internal Medicine

## 2010-08-22 ENCOUNTER — Ambulatory Visit (INDEPENDENT_AMBULATORY_CARE_PROVIDER_SITE_OTHER): Payer: Medicaid Other | Admitting: Internal Medicine

## 2010-08-22 VITALS — BP 130/91 | HR 76 | Temp 96.9°F | Ht 75.0 in | Wt 262.5 lb

## 2010-08-22 DIAGNOSIS — F419 Anxiety disorder, unspecified: Secondary | ICD-10-CM

## 2010-08-22 DIAGNOSIS — F411 Generalized anxiety disorder: Secondary | ICD-10-CM

## 2010-08-22 MED ORDER — ALPRAZOLAM 1 MG PO TABS: 1.0000 mg | ORAL_TABLET | Freq: Two times a day (BID) | ORAL | Status: DC | PRN

## 2010-08-22 NOTE — Patient Instructions (Addendum)
John Davenport got angry and started cursing and then walked out of the clinic for when he was told to give a urine for drug screen to start the controlled substance contract. No followup appointment made at this point of time

## 2010-08-22 NOTE — Assessment & Plan Note (Addendum)
He went to the pharmacy to get a refill for Xanax,  But was told that he cannot get it refilled before 08/31/2010 as he had one prescription refilled on 27 April and it was with 0.5 mg tablets total of 120 of them. A call the clinic yesterday and talked with the triage nurse and was angry and frustrated because he was not able to get the refill. [facility] on [address] faxed the refill report to the clinic yesterday which showed one refill on 07/09/2010 with 30 tablets of 1 mg Xanax with 0 refills and another refill on 07/26/2010 0.5 mg tablets total of 120 with 0 refills. So due to the confusion, as are records were not congruent with the pharmacy refill records, we called him in today so we can straighten it out. He looks little agitated today but says that he hasn't had any refill on 07/26/2010.  So I called the pharmacy and talked with the pharmacist who state that they also doesn't have any records of any refill done on 07/26/2010!!! At this point of time I talked with Dr. Aundria Rud and we decided to do the prescription refill and start him on contract for controlled substance. He was okay to sign the contract until he was told to give a urine sample for UDS. He got angry and got out of the room and started cursing the nursing staff and me saying that we are all not helping him out and he is bearing with [place] for 3 years.  I explained him that this is the clinic policy for anyone to be on control substance and also we are bound by law and he is not the only one who is getting this urine sample. he said he won't give the urine sample and walked out of the clinic, in an angry and agitated mood. I already had printed the prescription for Xanax 1 mg twice daily when necessary with 60 tablets with 0 refills before he started acting out.

## 2010-08-22 NOTE — Progress Notes (Signed)
  Subjective:    Patient ID: John Davenport, male    DOB: 09-Jul-1975, 35 y.o.   MRN: [id number]  HPI Mr. Delrossi is a 35 year old man with a past with a history of CAD status post CABG, systolic CHF who comes to the clinic for a prescription refill for Xanax. He went to the pharmacy to get a refill for Xanax,  But was told that he cannot get it refilled before 08/31/2010 as he had one prescription refilled on 27 April and it was with 0.5 mg tablets total of 120 of them. A call the clinic yesterday and talked with the triage nurse and was angry and frustrated because he was not able to get the refill. [facility] on [address] faxed the refill report to the clinic yesterday which showed one refill on 07/09/2010 with 30 tablets of 1 mg Xanax with 0 refills and another refill on 07/26/2010 0.5 mg tablets total of 120 with 0 refills. So due to the confusion, as are records were not congruent with the pharmacy refill records, we called him in today so we can straighten it out. He looks little agitated today but says that he hasn't had any refill on 07/26/2010.  So I called the pharmacy and talked with the pharmacist who state that they also doesn't have any records of any refill done on 07/26/2010!!! At this point of time I talked with Dr. Aundria Rud and we decided to do the prescription refill and start him on contract for controlled substance. He was okay to sign the contract until he was told to give a urine sample for UDS. He got angry and got out of the room and started cursing the nursing staff and me saying that we are all not helping him out and he is bearing with [place] for 3 years.  I explained him that this is the clinic policy for anyone to be on control substance and also we are bound by law and he is not the only one who is getting this urine sample. he said he won't give the urine sample and walked out of the clinic, in an angry and agitated mood.     Review of Systems    as per history  of present illness Objective:   Physical Exam    Constitutional: Vital signs reviewed.  Patient is a well-developed and well-nourished, agitated. Head: Normocephalic and atraumatic Ear: TM normal bilaterally Mouth: no erythema or exudates, MMM Eyes: PERRL, EOMI, conjunctivae normal, No scleral icterus.  Neck: Supple, Trachea midline normal ROM Cardiovascular: RRR, S1 normal, S2 normal Pulmonary/Chest: CTAB, no wheezes, rales, or rhonchi Abdominal: Soft. Non-tender, non-distended, bowel sounds are normal  Musculoskeletal: No joint deformities, erythema, or stiffness, ROM full and no nontender Neurological: A&O x3, Strenght is normal and symmetric bilaterally, cranial nerve II-XII are grossly intact, no focal motor deficit, sensory intact to light touch bilaterally.  Skin: Warm, dry and intact. No rash, cyanosis, or clubbing.       Assessment & Plan:

## 2010-08-25 ENCOUNTER — Encounter: Payer: Self-pay | Admitting: Internal Medicine

## 2010-10-14 ENCOUNTER — Encounter: Payer: Self-pay | Admitting: Internal Medicine

## 2010-11-14 ENCOUNTER — Encounter: Payer: Medicaid Other | Admitting: *Deleted

## 2010-11-17 ENCOUNTER — Emergency Department (HOSPITAL_COMMUNITY)
Admission: EM | Admit: 2010-11-17 | Discharge: 2010-11-17 | Disposition: A | Discharge status: Home / Self Care | Payer: Medicaid Other | Attending: Emergency Medicine | Admitting: Emergency Medicine

## 2010-11-17 DIAGNOSIS — I252 Old myocardial infarction: Secondary | ICD-10-CM | POA: Insufficient documentation

## 2010-11-17 DIAGNOSIS — K089 Disorder of teeth and supporting structures, unspecified: Secondary | ICD-10-CM | POA: Insufficient documentation

## 2010-11-17 DIAGNOSIS — Z95810 Presence of automatic (implantable) cardiac defibrillator: Secondary | ICD-10-CM | POA: Insufficient documentation

## 2010-11-17 DIAGNOSIS — R220 Localized swelling, mass and lump, head: Secondary | ICD-10-CM | POA: Insufficient documentation

## 2010-11-17 DIAGNOSIS — Z951 Presence of aortocoronary bypass graft: Secondary | ICD-10-CM | POA: Insufficient documentation

## 2010-11-17 DIAGNOSIS — K047 Periapical abscess without sinus: Secondary | ICD-10-CM | POA: Insufficient documentation

## 2010-11-17 DIAGNOSIS — K029 Dental caries, unspecified: Secondary | ICD-10-CM | POA: Insufficient documentation

## 2010-11-18 ENCOUNTER — Encounter: Payer: Self-pay | Admitting: *Deleted

## 2010-11-20 ENCOUNTER — Observation Stay (HOSPITAL_COMMUNITY)
Admission: EM | Admit: 2010-11-20 | Discharge: 2010-11-21 | DRG: 313 | Disposition: A | Discharge status: Home / Self Care | Payer: Medicaid Other | Source: Ambulatory Visit | Attending: Internal Medicine | Admitting: Internal Medicine

## 2010-11-20 ENCOUNTER — Emergency Department (HOSPITAL_COMMUNITY): Payer: Medicaid Other

## 2010-11-20 DIAGNOSIS — I2589 Other forms of chronic ischemic heart disease: Secondary | ICD-10-CM | POA: Insufficient documentation

## 2010-11-20 DIAGNOSIS — I252 Old myocardial infarction: Secondary | ICD-10-CM | POA: Insufficient documentation

## 2010-11-20 DIAGNOSIS — R079 Chest pain, unspecified: Principal | ICD-10-CM | POA: Insufficient documentation

## 2010-11-20 DIAGNOSIS — Z9861 Coronary angioplasty status: Secondary | ICD-10-CM | POA: Insufficient documentation

## 2010-11-20 DIAGNOSIS — E785 Hyperlipidemia, unspecified: Secondary | ICD-10-CM | POA: Insufficient documentation

## 2010-11-20 DIAGNOSIS — Z951 Presence of aortocoronary bypass graft: Secondary | ICD-10-CM | POA: Insufficient documentation

## 2010-11-20 DIAGNOSIS — I2510 Atherosclerotic heart disease of native coronary artery without angina pectoris: Secondary | ICD-10-CM | POA: Insufficient documentation

## 2010-11-20 DIAGNOSIS — R61 Generalized hyperhidrosis: Secondary | ICD-10-CM | POA: Insufficient documentation

## 2010-11-20 DIAGNOSIS — F17200 Nicotine dependence, unspecified, uncomplicated: Secondary | ICD-10-CM | POA: Insufficient documentation

## 2010-11-20 DIAGNOSIS — K047 Periapical abscess without sinus: Secondary | ICD-10-CM | POA: Insufficient documentation

## 2010-11-20 DIAGNOSIS — Z8249 Family history of ischemic heart disease and other diseases of the circulatory system: Secondary | ICD-10-CM | POA: Insufficient documentation

## 2010-11-20 LAB — BASIC METABOLIC PANEL
CO2: 31 mEq/L (ref 19–32)
Chloride: 101 mEq/L (ref 96–112)
Creatinine, Ser: 1.31 mg/dL (ref 0.50–1.35)
Potassium: 4.1 mEq/L (ref 3.5–5.1)
Sodium: 139 mEq/L (ref 135–145)

## 2010-11-20 LAB — CBC
HCT: 45.6 % (ref 39.0–52.0)
MCH: 29.3 pg (ref 26.0–34.0)
MCV: 88 fL (ref 78.0–100.0)
Platelets: 261 10*3/uL (ref 150–400)
RBC: 5.18 MIL/uL (ref 4.22–5.81)

## 2010-11-20 LAB — DIFFERENTIAL
Eosinophils Absolute: 0.4 10*3/uL (ref 0.0–0.7)
Lymphs Abs: 2.9 10*3/uL (ref 0.7–4.0)
Monocytes Relative: 9 % (ref 3–12)
Neutrophils Relative %: 64 % (ref 43–77)

## 2010-11-20 LAB — PROTIME-INR: INR: 0.95 (ref 0.00–1.49)

## 2010-11-21 DIAGNOSIS — R079 Chest pain, unspecified: Secondary | ICD-10-CM

## 2010-11-21 LAB — COMPREHENSIVE METABOLIC PANEL
ALT: 51 U/L (ref 0–53)
AST: 35 U/L (ref 0–37)
Alkaline Phosphatase: 114 U/L (ref 39–117)
CO2: 31 mEq/L (ref 19–32)
Chloride: 100 mEq/L (ref 96–112)
GFR calc Af Amer: >60 mL/min (ref >60)
GFR calc non Af Amer: >60 mL/min (ref >60)
Glucose, Bld: 95 mg/dL (ref 70–99)
Potassium: 4 mEq/L (ref 3.5–5.1)
Sodium: 138 mEq/L (ref 135–145)
Total Bilirubin: 0.3 mg/dL (ref 0.3–1.2)

## 2010-11-21 LAB — CARDIAC PANEL(CRET KIN+CKTOT+MB+TROPI)
CK, MB: 2.2 ng/mL (ref 0.3–4.0)
Relative Index: INVALID (ref 0.0–2.5)
Total CK: 96 U/L (ref 7–232)
Total CK: 84 U/L (ref 7–232)
Troponin I: <0.3 ng/mL (ref <0.30)
Troponin I: <0.3 ng/mL (ref <0.30)

## 2010-11-21 LAB — LIPID PANEL
Cholesterol: 187 mg/dL (ref 0–200)
LDL Cholesterol: 124 mg/dL — ABNORMAL HIGH (ref 0–99)
Total CHOL/HDL Ratio: 6.2 RATIO
VLDL: 33 mg/dL (ref 0–40)

## 2010-12-05 NOTE — Discharge Summary (Signed)
John Davenport, John Davenport          ACCOUNT NO.:  [id number]  MEDICAL RECORD NO.:  [id number]  LOCATION:  3730                         FACILITY:  [facility]  PHYSICIAN:  Hillis Range, MD       DATE OF BIRTH:  1975-10-27  DATE OF ADMISSION:  11/20/2010 DATE OF DISCHARGE:  11/21/2010                              DISCHARGE SUMMARY   PROCEDURES:  Two-view chest x-ray.  PRIMARY FINAL DISCHARGE DIAGNOSIS:  Chest pain, cardiac enzymes negative for myocardial infarction and outpatient followup plans.  SECONDARY DIAGNOSES: 1. Status post anterior myocardial infarction in October 2009, status     post bare-metal stent to the LAD. 2. Status post myocardial infarction in June 2010 in [place], status     post balloon angioplasty for in-stent restenosis. 3. History of ischemic cardiomyopathy with an ejection fraction of 30-     35% by echocardiogram in December 2011. 4. History of chest pain in 2010, LAD reoccluded and status post LIMA     to LAD on January 2011. 5. Postoperative PAS. 6. Hyperlipidemia. 7. Tobacco use. 8. Remote history of substance abuse. 9. Family history of premature coronary artery disease. 10.Tooth abscess. 11.Allergy or intolerance to OxyContin.  TIME AT DISCHARGE:  31 minutes.  HOSPITAL COURSE:  John Davenport is a 35 year old male with a history of coronary artery disease.  He had chest pain and came to the hospital, where he was admitted for further evaluation and treatment.  His cardiac enzymes were negative for MI.  His BNP was minimally elevated to 272.  A lipid profile showed total cholesterol of 187, triglycerides 167, HDL 30, LDL 124.  His white count was minimally elevated at 12.7000.  The chest x-ray showed no acute disease.  He was having mouth pain and was felt to have a tooth abscess.  He was started on antibiotics for this.  He was afebrile.  On November 21, 2010, he was seen by Dr. Johney Frame.  Dr. Johney Frame felt that since his cardiac enzymes were negative, no  further inpatient workup was indicated.  Because of the tooth abscess, he should be continued on clindamycin with early dental followup.  John Davenport is considered stable for discharge on November 21, 2010, to follow up as an outpatient.  Of note, she was seen by smoking cessation while in the hospital and smoking cessation is encouraged.  DISCHARGE INSTRUCTIONS:  His activity level is to be increased gradually.  He is encouraged to stick to a low-sodium heart-healthy diet.  He is to follow up with the PA for Dr. Johney Frame and our office will call him for an appointment.  He is encouraged to follow up with the dentist as soon as possible.  He is to follow up with the Outpatient Clinic as needed or as scheduled.  DISCHARGE MEDICATIONS: 1. Xanax 1 mg b.i.d. 2. Coreg 6.25 mg b.i.d. 3. Zocor is discontinued. 4. Crestor 20 mg daily. 5. Lasix 40 mg a day. 6. Sublingual nitroglycerin p.r.n. 7. Imdur 30 mg a day. 8. Clindamycin 300 mg q.8 h. for 10 days. 9. Aspirin 325 mg a day. 10.Nexium 40 mg a day. 11.Lisinopril 10 mg a day.     Theodore Demark, PA-C   ______________________________ Hillis Range,  MD    RB/MEDQ  D:  11/21/2010  T:  11/22/2010  Job:  [id number]  cc:   [facility]  Electronically Signed by Theodore Demark PA-C on 12/03/2010 02:01:31 PM Electronically Signed by Hillis Range MD on 12/05/2010 08:53:06 AM

## 2010-12-06 ENCOUNTER — Encounter: Payer: Self-pay | Admitting: Physician Assistant

## 2010-12-09 ENCOUNTER — Encounter: Payer: Self-pay | Admitting: Physician Assistant

## 2010-12-09 ENCOUNTER — Encounter: Payer: Medicaid Other | Admitting: Physician Assistant

## 2010-12-09 ENCOUNTER — Ambulatory Visit (INDEPENDENT_AMBULATORY_CARE_PROVIDER_SITE_OTHER): Payer: Medicaid Other | Admitting: Physician Assistant

## 2010-12-09 VITALS — BP 121/78 | HR 108 | Ht 75.0 in | Wt 257.0 lb

## 2010-12-09 DIAGNOSIS — E7800 Pure hypercholesterolemia, unspecified: Secondary | ICD-10-CM

## 2010-12-09 DIAGNOSIS — R5383 Other fatigue: Secondary | ICD-10-CM

## 2010-12-09 DIAGNOSIS — F17200 Nicotine dependence, unspecified, uncomplicated: Secondary | ICD-10-CM

## 2010-12-09 DIAGNOSIS — R000 Tachycardia, unspecified: Secondary | ICD-10-CM | POA: Insufficient documentation

## 2010-12-09 DIAGNOSIS — K047 Periapical abscess without sinus: Secondary | ICD-10-CM

## 2010-12-09 DIAGNOSIS — R059 Cough, unspecified: Secondary | ICD-10-CM | POA: Insufficient documentation

## 2010-12-09 DIAGNOSIS — R5381 Other malaise: Secondary | ICD-10-CM

## 2010-12-09 DIAGNOSIS — I472 Ventricular tachycardia: Secondary | ICD-10-CM

## 2010-12-09 DIAGNOSIS — R05 Cough: Secondary | ICD-10-CM

## 2010-12-09 DIAGNOSIS — I2510 Atherosclerotic heart disease of native coronary artery without angina pectoris: Secondary | ICD-10-CM

## 2010-12-09 DIAGNOSIS — E785 Hyperlipidemia, unspecified: Secondary | ICD-10-CM

## 2010-12-09 DIAGNOSIS — I10 Essential (primary) hypertension: Secondary | ICD-10-CM

## 2010-12-09 DIAGNOSIS — I5022 Chronic systolic (congestive) heart failure: Secondary | ICD-10-CM

## 2010-12-09 NOTE — Patient Instructions (Signed)
Your physician recommends that you schedule a follow-up appointment in: 01/2011 TO SEE DR. ALLRED AND HAVE HIS DEVICE CHECKED SAME DAY AS PER PAULA IN DEVICE CLINIC.  Your physician recommends that you return for lab work in: 01/2011 WHEN PT COMES IN TO SEE DR. ALLRED HE WILL NEED FASTING LIVER AND LIPID PANEL 272.4 FOR HYPERLIPIDEMIA  You have been referred to PULMONOLGY TO SEE EITHER DR. CLANCE OR DR. SOOD FOR FATIGUE AND TO R/O OBSTRUCTIVE SLEEP APNEA AS PER SCOTT WEAVER, PA-C  YOU CAN PICK UP CLARITIN 10 MG OVER THE  COUNTER TAKE 1 TABLET DAILY ONLY AS NEEDED, SALINE NASAL SPRAY USE AS DIRECTED AND TO FOLLOW UP WITH YOUR PRIMARY CARE PHYSICIAN IF NOT FEELING BETTER WITHIN 2-3 DAYS.

## 2010-12-09 NOTE — Assessment & Plan Note (Signed)
Controlled.  

## 2010-12-09 NOTE — Assessment & Plan Note (Signed)
No recurrent angina.  Continue aspirin and statin.

## 2010-12-09 NOTE — Assessment & Plan Note (Signed)
He has symptoms of a viral URI.  I have recommended conservative management.  He should be liberal with fluids for now.  He can use over-the-counter loratadine as needed.  He can also use over-the-counter nasal saline as needed.  He should follow up with his PCP if symptoms should not improve over the next 7-10 days.

## 2010-12-09 NOTE — Progress Notes (Signed)
History of Present Illness: Primary Electrophysiologist:  Dr. Hillis Range  John Davenport is a 35 y.o. male who presents for post hospital follow up.  He has a h/o 1v CAD s/p BMS to LAD 12/2007 with subsequent POBA to LAD stent in 08/2008.  He eventually required single vessel CABG in 03/2009 due to restenosis.  He also has an Ischemic CM with EF 30-35%.  He is s/p AICD.  Last echocardiogram 12/11: Mild LVH, EF 30-35%, anteroseptal and apical akinesis, grade 1 diastolic dysfunction.  Last Myoview 12/11: No ischemia, large anterior apical scar consistent with prior MI, EF 22%.  He was admitted 8/22-8/23 with complaints of chest pain.  Myocardial infarction was ruled out.  He had some dental pain it was felt to be related to a dental abscess and he was placed on clindamycin.  He was asked to followup with his dentist.  Smoking cessation was encouraged.  Labs: Hemoglobin 15.2, potassium 4, creatinine 1.26, ALT 51, cardiac enzymes negative x2, BNP 272.3, TC 187, TG 167, HDL 30, LDL 124, TSH 1.383.  Chest x-ray was nonacute.   He denies any further chest pain.  No dyspnea.  No syncope or near syncope.  No orthopnea or PND.  No ICD therapies.  He still has problems with his dental abscess.  Sees a dentist later this week.  Still taking Clindamycin.  He has had URI symptoms the last day.  No fevers or chills.  Notes a cough but nonproductive.  Sneezing.  Has a sore throat.  No otalgia.  No vomiting or diarrhea.  Plans to see his PCP soon.  Notes significant fatigue.  Describes daytime hypersomnolence.  Does have a h/o snoring.  Has never been evaluated for OSA.  Past Medical History  Diagnosis Date  . Ischemic cardiomyopathy     echocardiogram 12/11: Mild LVH, EF 30-35%, anteroseptal and apical akinesis, grade 1 diastolic dysfunction  . Hypercholesteremia   . LBP (low back pain)   . Coronary artery disease     CAD s/p AMI 10/09 and 6/10;  s/p BMS to LAD 12/2007 with subsequent POBA to LAD stent in  08/2008.  He eventually required single vessel CABG in 03/2009 due to restenosis;  Myoview 12/11: No ischemia, large anterior apical scar consistent with prior MI, EF 22%.  . Chronic systolic heart failure     Ischemic CM (EF 30%) with NYHA Class III CHF  . Back pain   . Paroxysmal ventricular tachycardia     s/p AICD  . Esophageal reflux   . Anxiety state, unspecified   . Tobacco abuse     quit 8/12    Current Outpatient Prescriptions  Medication Sig Dispense Refill  . acetaminophen (TYLENOL) 325 MG tablet Take 2 tablets (650 mg total) by mouth every 4 (four) hours as needed for pain.  100 tablet  2  . ALPRAZolam (XANAX) 1 MG tablet Take 1 tablet (1 mg total) by mouth 2 (two) times daily as needed for anxiety.  60 tablet  0  . aspirin 325 MG tablet Take 325 mg by mouth daily.        . carvedilol (COREG) 6.25 MG tablet Take 6.25 mg by mouth 2 (two) times daily with a meal.        . esomeprazole (NEXIUM) 40 MG capsule Take 40 mg by mouth daily before breakfast.        . furosemide (LASIX) 40 MG tablet Take 40 mg by mouth daily.        Marland Kitchen  isosorbide mononitrate (IMDUR) 30 MG 24 hr tablet Take 30 mg by mouth daily.        Marland Kitchen lisinopril (PRINIVIL,ZESTRIL) 10 MG tablet Take 10 mg by mouth daily.        . nitroGLYCERIN (NITROSTAT) 0.4 MG SL tablet Place 0.4 mg under the tongue every 5 (five) minutes as needed.        . rosuvastatin (CRESTOR) 20 MG tablet Take 20 mg by mouth daily.          Allergies: Allergies  Allergen Reactions  . Oxycodone Hcl Nausea And Vomiting    Vital Signs: BP 121/78  Pulse 108  Ht 6\' 3"  (1.905 m)  Wt 257 lb (116.574 kg)  BMI 32.12 kg/m2  PHYSICAL EXAM: Well nourished, well developed, in no acute distress HEENT: PERRLA, TMs clear bilat, OP somewhat erythematous without exudate Lymph: no adenopathy Neck: no JVD Cardiac:  normal S1, S2; RRR; no murmur Lungs:  clear to auscultation bilaterally, no wheezing, rhonchi or rales Abd: soft, nontender, no  hepatomegaly Ext: no edema Skin: warm and dry Neuro:  CNs 2-12 intact, no focal abnormalities noted  EKG:  Sinus tachycardia, heart rate 108, normal axis, poor R-wave progression, nonspecific ST-T wave changes  ASSESSMENT AND PLAN:

## 2010-12-09 NOTE — Assessment & Plan Note (Signed)
This is most likely related to his recent URI.  He is also in significant pain from his dental abscess.

## 2010-12-09 NOTE — Assessment & Plan Note (Signed)
He recently quit.

## 2010-12-09 NOTE — Assessment & Plan Note (Signed)
He has followup with his dentist soon.  He continues on clindamycin.

## 2010-12-09 NOTE — Assessment & Plan Note (Signed)
Statin therapy reinitiated during his hospitalization.  Check lipids and liver function at his followup.

## 2010-12-09 NOTE — Assessment & Plan Note (Signed)
No ICD therapies.  Followup with Dr. Johney Frame as scheduled.

## 2010-12-09 NOTE — Assessment & Plan Note (Signed)
Volume stable.  Continue current therapy.

## 2010-12-12 NOTE — H&P (Signed)
NAME:  GERAMY, LAMORTE NO.:  [id number]  MEDICAL RECORD NO.:  [id number]  LOCATION:  [facility]                         FACILITY:  [facility]  PHYSICIAN:  Harlon Flor, MD   DATE OF BIRTH:  April 20, 1975  DATE OF ADMISSION:  11/20/2010 DATE OF DISCHARGE:                             HISTORY & PHYSICAL   ADMISSION DIAGNOSES: 1. Chest pain. 2. Tooth abscess.  CHIEF COMPLAINT:  Chest pain.  HISTORY OF PRESENT ILLNESS:  Mr. Kronenberger is a 35 year old white male with ischemic cardiomyopathy who presents tonight with chest pain.  He had an acute MI in 11/05/2009with bare-metal stent to his LAD.  He had a subsequent occlusion of this stent in June 2010 in [place] and underwent balloon angioplasty that time.  In December 2010, he had a reocclusion of the LAD.  Viability studies showed some viability in the anterior myocardium, and he underwent single-vessel LIMA to the LAD in January 2011 per Dr. Cornelius Moras.  He also has an ICD that was placed for primary prevention of sudden cardiac death in Feb 02, 2009.  He comes in tonight with pain that has been present for approximately 12 hours. This pain got worse when he laid down tonight to sleep.  He does feel somewhat better when he sits up, and is somewhat worsened by deep breathing.  He admits this pain is much different than the pain he experienced with his prior MI.  He is not having a shortness of breath, diaphoresis, or radiation of his pain.  Currently, his pain is still present, but his enzymes have been negative x1.  PAST MEDICAL HISTORY: 1. Coronary artery disease:  Acute MI with LAD, PCI with bare-metal     stent in 2009/11/05and subsequent reocclusion of stent in June     2010, undergoing balloon angioplasty in [place].  Reocclusion     again in December 2010 with subsequent single-vessel LIMA to LAD on     April 13, 2009, after viability study per Dr. Cornelius Moras. 2. Ischemic cardiomyopathy:  LVEF 30-35% with  prophylactic dual-     chamber ICD placed in 2009-02-02. 3. Tooth abscess. 4. Hyperlipidemia.  CURRENT MEDICATIONS:  The patient does not have his medications with him and is unsure of what he is taking.  Per review with him,  it appears he is on: 1. Aspirin 325 daily. 2. Carvedilol 6.25 b.i.d. 3. Simvastatin 20 mg daily. 4. Xanax 1 mg as needed. 5. Lisinopril 10 mg daily. 6. Imdur 30 mg daily. 7. Nexium 40 mg daily. 8. He is possibly on an unknown dose of Lasix, although he is not sure     if he is taking this or not.  ALLERGIES:  OXYCONTIN.  SOCIAL HISTORY:  He continues to smoke one-half pack per day.  He has a history of alcohol and drug abuse, but has not used this in many years. He lives at home and does not work.  FAMILY HISTORY:  His father had a myocardial infarction at age 11.  REVIEW OF SYMPTOMS:  A full review of systems is obtained and is negative except as noted in HPI.  PHYSICAL EXAM:  VITAL SIGNS:  Blood pressure 117/72, pulse 91, temperature  97.5, respirations 20.  Weight is pending. GENERAL:  In no acute distress. HEENT:  Extraocular movements are intact.  He has very poor dentition and he has a abscess in his left lower mandible. CARDIOVASCULAR:  Regular rhythm, no gallop, no murmur.  Jugular venous pressure is approximately 6-8 cm. LUNGS:  Clear to auscultation bilaterally. ABDOMEN:  Soft, nontender, nondistended. EXTREMITIES:  There is no edema.  He has 2+ distal pulses. NEURO:  Grossly focal.  Moves all extremities well. SKIN:  No rashes. LYMPH:  No lymphadenopathy.  LABORATORY DATA:  Lab data is reviewed and is normal except for a mild leukocytosis of 12.7.  His EKG shows normal sinus rhythm with anterolateral Q-waves.  An echocardiogram from December 2011 shows an LVEDP 52,  LVH and EF of 30-35% with anterior akinesis.  ASSESSMENT/PLAN:  Mr. Mackowski is a 35 year old white male with ischemic cardiomyopathy status post an LAD myocardial  infarction x3, and LIMA to his LAD in 2011.  He is here with atypical chest pain that has been constant for the last 12 hours.  This is different than his previous angina.  This is suspected to be related to gastroesophageal reflux disease or musculoskeletal pain.  Because of his previous disease, we will need to rule him out for myocardial infarction.  He also had a tooth abscess. 1. Chest pain:  I suspect gastroesophageal reflux disease versus     musculoskeletal.  His enzymes are negative x1 after 12 hours of     pain.  We will continue aspirin and his statin and rule him out for     myocardial infarction with enzymes.  Consider functional study     tomorrow. If his enzymes do elevate, we will start heparin. 2. Ischemic cardiomyopathy:  He is currently euvolemic. We will add a     BNP to his labs and continue his home heart failure medications     including carvedilol and lisinopril. 3. Tooth abscess:  We will start clindamycin orally.  He will need an     oral surgery referral at discharge.     Harlon Flor, MD    MMB/MEDQ  D:  11/21/2010  T:  11/21/2010  Job:  [id number]  cc:   Hillis Range, MD  Electronically Signed by Meridee Score MD on 12/12/2010 08:06:48 PM

## 2010-12-30 ENCOUNTER — Encounter: Payer: Self-pay | Admitting: Pulmonary Disease

## 2010-12-30 ENCOUNTER — Ambulatory Visit (INDEPENDENT_AMBULATORY_CARE_PROVIDER_SITE_OTHER): Payer: Medicaid Other | Admitting: Pulmonary Disease

## 2010-12-30 VITALS — BP 114/78 | HR 75 | Temp 97.6°F | Ht 75.0 in | Wt 259.8 lb

## 2010-12-30 DIAGNOSIS — G4733 Obstructive sleep apnea (adult) (pediatric): Secondary | ICD-10-CM

## 2010-12-30 HISTORY — DX: Obstructive sleep apnea (adult) (pediatric): G47.33

## 2010-12-30 NOTE — Progress Notes (Deleted)
  Subjective:    Patient ID: John Davenport, male    DOB: 08/12/1975, 35 y.o.   MRN: [id number]  HPI    Review of Systems  HENT: Positive for congestion, rhinorrhea and sneezing.   Respiratory: Positive for cough and shortness of breath.   Psychiatric/Behavioral: Positive for dysphoric mood. The patient is nervous/anxious.   Acid Heartburn     Objective:   Physical Exam        Assessment & Plan:

## 2010-12-30 NOTE — Progress Notes (Signed)
Subjective:    Patient ID: John Davenport, male    DOB: 1975-06-07, 35 y.o.   MRN: [id number]  HPI  35 yo male for sleep evaluation.  He goes to bed at 10 pm.  He does not fall asleep until 5 am.  He says he will start to fall asleep, but then wake up.  He will stay in bed until 5 pm.  He feels tired all the time.  He will occasionally get headaches after waking up.  He will wake up with a cough and snoring.  He has been told he is a loud snorer.  He also talks in his sleep.  He was using xanax for his nerves, and this helped him sleep also.  He ran out of this one month ago.  He drinks tea during the day.  He continues to smoke cigarettes.  The patient denies sleep walking, bruxism, or nightmares.  There is no history of restless legs.  The patient denies sleep hallucinations, sleep paralysis, or cataplexy.  He has gained 50 lbs over the past year.  There is no history of thyroid disease.  He quit drinking alcohol years ago.  He is planning to switch his primary care to [facility] now that his medicaid has been approved.  Epworth score is 15 out of 24.  Past Medical History  Diagnosis Date  . Ischemic cardiomyopathy     echocardiogram 12/11: Mild LVH, EF 30-35%, anteroseptal and apical akinesis, grade 1 diastolic dysfunction  . Hypercholesteremia   . LBP (low back pain)   . Coronary artery disease     CAD s/p AMI 10/09 and 6/10;  s/p BMS to LAD 12/2007 with subsequent POBA to LAD stent in 08/2008.  He eventually required single vessel CABG in 03/2009 due to restenosis;  Myoview 12/11: No ischemia, large anterior apical scar consistent with prior MI, EF 22%.  . Chronic systolic heart failure     Ischemic CM (EF 30%) with NYHA Class III CHF  . Back pain   . Paroxysmal ventricular tachycardia     s/p AICD  . Esophageal reflux   . Anxiety state, unspecified   . Tobacco abuse   . Heart attack     Family History  Problem Relation Age of Onset  . Heart attack  Father 15    died at 70 after multiple MI's  . COPD Maternal Grandmother     Social History  . Marital Status: Single    Number of Children: 1   Occupational History  . disabled    Social History Main Topics  . Smoking status: Current Everyday Smoker -- 1.0 packs/day for 17 years    Types: Cigarettes  . Alcohol Use: No  . Drug Use: No     REMOTE H/O SUBSTANCE ABUSE   Social History Narrative   Pt lives with his mom, brother, and 32 yo son in [place].Quit tobacco in Jan. 2011 prior to heart surgery (prior -1/2 PPD (down from 1 ppd per day, onset age 89) alcohol abuse- prior, hasn't drank since age 25no illicit drug use Currently unemployed, awaiting disabilitySingle    Allergies  Allergen Reactions  . Oxycodone Hcl Nausea And Vomiting    Outpatient Prescriptions Prior to Visit  Medication Sig Dispense Refill  . acetaminophen (TYLENOL) 325 MG tablet Take 2 tablets (650 mg total) by mouth every 4 (four) hours as needed for pain.  100 tablet  2  . ALPRAZolam (XANAX) 1 MG tablet Take 1 tablet (  1 mg total) by mouth 2 (two) times daily as needed for anxiety.  60 tablet  0  . aspirin 325 MG tablet Take 325 mg by mouth daily.        . carvedilol (COREG) 6.25 MG tablet Take 6.25 mg by mouth 2 (two) times daily with a meal.        . esomeprazole (NEXIUM) 40 MG capsule Take 40 mg by mouth daily before breakfast.        . furosemide (LASIX) 40 MG tablet Take 40 mg by mouth daily.        . isosorbide mononitrate (IMDUR) 30 MG 24 hr tablet Take 30 mg by mouth daily.        Marland Kitchen lisinopril (PRINIVIL,ZESTRIL) 10 MG tablet Take 10 mg by mouth daily.        . nitroGLYCERIN (NITROSTAT) 0.4 MG SL tablet Place 0.4 mg under the tongue every 5 (five) minutes as needed.        . rosuvastatin (CRESTOR) 20 MG tablet Take 20 mg by mouth daily.         Review of Systems Positive for cough, nasal congestion, shortness of breath with exertion, heartburn, anxiety.    Objective:   Physical Exam  BP  114/78  Pulse 75  Temp(Src) 97.6 F (36.4 C) (Oral)  Ht 6\' 3"  (1.905 m)  Wt 259 lb 12.8 oz (117.845 kg)  BMI 32.47 kg/m2  SpO2 98%  General - Obese HEENT - PERRLA, EOMI, No sinus tenderness, MP 3, 2+ tonsills, no oral exudate, no LAN, no thyromegaly Cardiac - s1s2 regular, no murmur, pulses symmetric Chest - normal respiratory excursion, no wheeze/rales/dullness Abd - obese, soft, nontender Ext - no e/c/c Neuro - normal strength, CN intact Psych - normal mood/behavior     Assessment & Plan:   OSA (obstructive sleep apnea) He has snoring, sleep disruption, and daytime sleepiness.  He has history of cardiovascular disease.  I am concerned he has sleep apnea.  I explained how sleep apnea can affect the patient's health.  Driving precautions and importance of weight loss were discussed.  Treatment options for sleep apnea were reviewed.  To further assess will arrange for in-lab sleep study    Updated Medication List Outpatient Encounter Prescriptions as of 12/30/2010  Medication Sig Dispense Refill  . acetaminophen (TYLENOL) 325 MG tablet Take 2 tablets (650 mg total) by mouth every 4 (four) hours as needed for pain.  100 tablet  2  . ALPRAZolam (XANAX) 1 MG tablet Take 1 tablet (1 mg total) by mouth 2 (two) times daily as needed for anxiety.  60 tablet  0  . aspirin 325 MG tablet Take 325 mg by mouth daily.        . carvedilol (COREG) 6.25 MG tablet Take 6.25 mg by mouth 2 (two) times daily with a meal.        . esomeprazole (NEXIUM) 40 MG capsule Take 40 mg by mouth daily before breakfast.        . furosemide (LASIX) 40 MG tablet Take 40 mg by mouth daily.        . isosorbide mononitrate (IMDUR) 30 MG 24 hr tablet Take 30 mg by mouth daily.        Marland Kitchen lisinopril (PRINIVIL,ZESTRIL) 10 MG tablet Take 10 mg by mouth daily.        . nitroGLYCERIN (NITROSTAT) 0.4 MG SL tablet Place 0.4 mg under the tongue every 5 (five) minutes as needed.        Marland Kitchen  rosuvastatin (CRESTOR) 20 MG tablet  Take 20 mg by mouth daily.

## 2010-12-30 NOTE — Patient Instructions (Signed)
Will arrange for sleep study Will arrange for follow up after sleep study reviewed

## 2010-12-30 NOTE — Assessment & Plan Note (Signed)
He has snoring, sleep disruption, and daytime sleepiness.  He has history of cardiovascular disease.  I am concerned he has sleep apnea.  I explained how sleep apnea can affect the patient's health.  Driving precautions and importance of weight loss were discussed.  Treatment options for sleep apnea were reviewed.  To further assess will arrange for in-lab sleep study

## 2010-12-31 LAB — URINE DRUGS OF ABUSE SCREEN W ALC, ROUTINE (REF LAB)
Amphetamine Screen, Ur: NEGATIVE
Barbiturate Quant, Ur: NEGATIVE
Benzodiazepines.: POSITIVE — AB
Methadone: NEGATIVE
Propoxyphene: NEGATIVE

## 2010-12-31 LAB — LIPID PANEL
LDL Cholesterol: 100 — ABNORMAL HIGH
Total CHOL/HDL Ratio: 4.1
Triglycerides: 75
VLDL: 15

## 2010-12-31 LAB — BASIC METABOLIC PANEL
BUN: 8
Creatinine, Ser: 1.1
GFR calc non Af Amer: >60

## 2010-12-31 LAB — COMPREHENSIVE METABOLIC PANEL
ALT: 41
ALT: 74 — ABNORMAL HIGH
AST: 164 — ABNORMAL HIGH
Albumin: 3.6
Alkaline Phosphatase: 54
Alkaline Phosphatase: 71
BUN: 6
CO2: 30
Chloride: 105
Chloride: 106
GFR calc Af Amer: >60
Glucose, Bld: 106 — ABNORMAL HIGH
Potassium: 3.6
Potassium: 4.1
Sodium: 141
Total Bilirubin: 0.8
Total Bilirubin: 0.9

## 2010-12-31 LAB — BENZODIAZEPINE, QUANTITATIVE, URINE: Alprazolam (GC/LC/MS), ur confirm: NEGATIVE

## 2010-12-31 LAB — CARDIAC PANEL(CRET KIN+CKTOT+MB+TROPI)
CK, MB: 111.1 — ABNORMAL HIGH
Relative Index: 10.7 — ABNORMAL HIGH
Relative Index: 14.8 — ABNORMAL HIGH
Relative Index: 16.3 — ABNORMAL HIGH
Troponin I: 38.59
Troponin I: >100

## 2010-12-31 LAB — CBC
HCT: 41.6
HCT: 44.8
Hemoglobin: 14
MCV: 89.5
Platelets: 204
Platelets: 217
RBC: 4.69
RDW: 14
WBC: 15.3 — ABNORMAL HIGH
WBC: 17.6 — ABNORMAL HIGH
WBC: 22.9 — ABNORMAL HIGH

## 2010-12-31 LAB — OPIATE, QUANTITATIVE, URINE
Codeine Urine: NEGATIVE ng/mL
Hydromorphone GC/MS Conf: NEGATIVE ng/mL
Morphine, Confirm: 3330 ng/mL
Oxycodone, ur: NEGATIVE ng/mL

## 2010-12-31 LAB — COCAINE, URINE, CONFIRMATION

## 2011-01-06 ENCOUNTER — Ambulatory Visit (HOSPITAL_BASED_OUTPATIENT_CLINIC_OR_DEPARTMENT_OTHER): Payer: Medicaid Other | Attending: Pulmonary Disease

## 2011-01-06 DIAGNOSIS — R0989 Other specified symptoms and signs involving the circulatory and respiratory systems: Secondary | ICD-10-CM | POA: Insufficient documentation

## 2011-01-06 DIAGNOSIS — Z79899 Other long term (current) drug therapy: Secondary | ICD-10-CM | POA: Insufficient documentation

## 2011-01-06 DIAGNOSIS — G4733 Obstructive sleep apnea (adult) (pediatric): Secondary | ICD-10-CM | POA: Insufficient documentation

## 2011-01-06 DIAGNOSIS — Z7982 Long term (current) use of aspirin: Secondary | ICD-10-CM | POA: Insufficient documentation

## 2011-01-06 DIAGNOSIS — I4949 Other premature depolarization: Secondary | ICD-10-CM | POA: Insufficient documentation

## 2011-01-06 DIAGNOSIS — R0609 Other forms of dyspnea: Secondary | ICD-10-CM | POA: Insufficient documentation

## 2011-01-22 NOTE — Procedures (Signed)
NAME:  John Davenport, CLAUSON NO.:  [id number]  MEDICAL RECORD NO.:  [id number]          PATIENT TYPE:  OUT  LOCATION:  SLEEP CENTER                 FACILITY:  [facility]  PHYSICIAN:  Coralyn Helling, MD        DATE OF BIRTH:  11/17/1975  DATE OF STUDY:  01/06/2011                           NOCTURNAL POLYSOMNOGRAM  REFERRING PHYSICIAN:  Coralyn Helling, MD  INDICATION FOR STUDY:  [name] is a 35 year old male who has a history of cardiovascular disease. He also has a history of snoring, sleep disruption and daytime sleepiness. He is, therefore, referred to the sleep lab for evaluation of hypersomnia with obstructive sleep apnea.  Height is 6 feet, 3 inches, weight is 260 pounds, BMI is 32, neck size is 16 inches.  EPWORTH SLEEPINESS SCORE:  18.  MEDICATIONS:  Carvedilol, Lasix, Xanax, lisinopril, Nexium, Crestor, isosorbide and aspirin  SLEEP ARCHITECTURE:  Total recording time was 369 minutes, total sleep time was 323 minutes. Sleep efficiency was 87%. Sleep latency was 24 minutes, REM latency was 61 minutes. The patient was observed in all stages of sleep and slept with the supine and non-supine positions.  RESPIRATORY DATA:  The average respiratory rate 22, moderate snoring was noted by the technician. The overall apnea-hypopnea index was 7.8 and the respiratory disturbance index was 14.5. The events were exclusively obstructive in nature. The REM apnea-hypopnea index was 16.3. The supine apnea-hypopnea index was 11.2.  OXYGEN DATA:  The baseline oxygenation was 93%. The oxygen saturation nadir was 85%. The patient spent a total of 1.5 minutes with an oxygen saturation below 88%. The study was conducted without the use of supplemental oxygen.  CARDIAC DATA:  The average heart rate was 82 and the rhythm strip showed sinus rhythm with occasional PVCs.  MOVEMENT-PARASOMNIA:  The periodic limb movement index was 14.1 and the patient had no restroom  trips.  IMPRESSIONS-RECOMMENDATIONS:  This study shows evidence for mild-to- moderate obstructive sleep apnea with an apnea-hypopnea index of 7.8, and oxygen saturation nadir of 85%.  In addition to diet, exercise and weight reduction, additional therapeutic interventions could include CPAP therapy, oral appliance and surgical intervention.     Coralyn Helling, MD Diplomat, American Board of Sleep Medicine    VS/MEDQ  D:  01/21/2011 18:16:24  T:  01/22/2011 06:01:28  Job:  [id number]

## 2011-01-24 ENCOUNTER — Telehealth: Payer: Self-pay | Admitting: Pulmonary Disease

## 2011-01-24 DIAGNOSIS — G4733 Obstructive sleep apnea (adult) (pediatric): Secondary | ICD-10-CM

## 2011-01-24 NOTE — Telephone Encounter (Signed)
PSG 01/06/11>>AHI 7.8, RDI 14.5, SpO2 low 85%  Will have my nurse schedule ROV to review results.

## 2011-01-24 NOTE — Telephone Encounter (Signed)
I spoke with pt mother and she states she will tell pt to call me back on Monday morning. Will await for pt call back

## 2011-01-28 NOTE — Telephone Encounter (Signed)
I left message with mother for him to call back

## 2011-01-29 DIAGNOSIS — G4733 Obstructive sleep apnea (adult) (pediatric): Secondary | ICD-10-CM

## 2011-01-29 DIAGNOSIS — I4949 Other premature depolarization: Secondary | ICD-10-CM

## 2011-01-29 DIAGNOSIS — R0989 Other specified symptoms and signs involving the circulatory and respiratory systems: Secondary | ICD-10-CM

## 2011-01-29 DIAGNOSIS — Z79899 Other long term (current) drug therapy: Secondary | ICD-10-CM

## 2011-01-29 DIAGNOSIS — Z7982 Long term (current) use of aspirin: Secondary | ICD-10-CM

## 2011-01-29 DIAGNOSIS — R0609 Other forms of dyspnea: Secondary | ICD-10-CM

## 2011-01-31 NOTE — Telephone Encounter (Signed)
I spoke with pt mother and pt is scheduled to come in 02/10/11 at 1:30 to discuss these results.

## 2011-02-05 ENCOUNTER — Encounter: Payer: Self-pay | Admitting: Internal Medicine

## 2011-02-05 ENCOUNTER — Other Ambulatory Visit (INDEPENDENT_AMBULATORY_CARE_PROVIDER_SITE_OTHER): Payer: Medicaid Other | Admitting: *Deleted

## 2011-02-05 ENCOUNTER — Ambulatory Visit (INDEPENDENT_AMBULATORY_CARE_PROVIDER_SITE_OTHER): Payer: Medicaid Other | Admitting: Internal Medicine

## 2011-02-05 DIAGNOSIS — I5022 Chronic systolic (congestive) heart failure: Secondary | ICD-10-CM

## 2011-02-05 DIAGNOSIS — I498 Other specified cardiac arrhythmias: Secondary | ICD-10-CM

## 2011-02-05 DIAGNOSIS — I472 Ventricular tachycardia: Secondary | ICD-10-CM

## 2011-02-05 DIAGNOSIS — R000 Tachycardia, unspecified: Secondary | ICD-10-CM

## 2011-02-05 DIAGNOSIS — E785 Hyperlipidemia, unspecified: Secondary | ICD-10-CM

## 2011-02-05 DIAGNOSIS — I2589 Other forms of chronic ischemic heart disease: Secondary | ICD-10-CM

## 2011-02-05 DIAGNOSIS — F17200 Nicotine dependence, unspecified, uncomplicated: Secondary | ICD-10-CM

## 2011-02-05 LAB — ICD DEVICE OBSERVATION
AL AMPLITUDE: 9.2 mv
AL THRESHOLD: 0.4 V
CHARGE TIME: 8.7 s
DEV-0020ICD: NEGATIVE
RV LEAD THRESHOLD: 1.2 V
TZAT-0018FASTVT: NEGATIVE
TZST-0001FASTVT: 3
TZST-0001FASTVT: 5
TZST-0001FASTVT: 8
TZST-0003FASTVT: 41 J
TZST-0003FASTVT: 41 J
TZST-0003FASTVT: 41 J
TZST-0003FASTVT: 41 J

## 2011-02-05 LAB — HEPATIC FUNCTION PANEL
ALT: 43 U/L (ref 0–53)
AST: 24 U/L (ref 0–37)
Albumin: 3.9 g/dL (ref 3.5–5.2)
Alkaline Phosphatase: 68 U/L (ref 39–117)
Bilirubin, Direct: 0 mg/dL (ref 0.0–0.3)
Total Protein: 7.3 g/dL (ref 6.0–8.3)

## 2011-02-05 LAB — LIPID PANEL
Cholesterol: 172 mg/dL (ref 0–200)
Triglycerides: 125 mg/dL (ref 0.0–149.0)

## 2011-02-05 MED ORDER — NITROGLYCERIN 0.4 MG SL SUBL: 0.4000 mg | SUBLINGUAL_TABLET | SUBLINGUAL | Status: DC | PRN

## 2011-02-05 NOTE — Assessment & Plan Note (Signed)
Stable without volume overload on exam

## 2011-02-05 NOTE — Progress Notes (Signed)
The patient presents today for routine electrophysiology followup.  Since last being seen in our clinic, the patient reports doing very well.   His indigestion, CP, and SOB have all resolved.  Unfortunately, he has started smoking again.  He has a stable cough and congestion.  He feels well.  He has been hunting and has killed two deer.  Today, he denies symptoms of palpitations, chest pain, shortness of breath, orthopnea, PND, lower extremity edema, dizziness, presyncope, syncope, or neurologic sequela.  The patient feels that he is tolerating medications without difficulties and is otherwise without complaint today.   Past Medical History  Diagnosis Date  . Ischemic cardiomyopathy     echocardiogram 12/11: Mild LVH, EF 30-35%, anteroseptal and apical akinesis, grade 1 diastolic dysfunction  . Hypercholesteremia   . LBP (low back pain)   . Coronary artery disease     CAD s/p AMI 10/09 and 6/10;  s/p BMS to LAD 12/2007 with subsequent POBA to LAD stent in 08/2008.  He eventually required single vessel CABG in 03/2009 due to restenosis;  Myoview 12/11: No ischemia, large anterior apical scar consistent with prior MI, EF 22%.  . Chronic systolic heart failure     Ischemic CM (EF 30%) with NYHA Class III CHF  . Back pain   . Paroxysmal ventricular tachycardia     s/p AICD  . Esophageal reflux   . Anxiety state, unspecified   . Tobacco abuse   . Heart attack    Past Surgical History  Procedure Date  . Cardiac defibrillator placement   . Coronary artery bypass graft     1/11  . Coronary angioplasty with stent placement     LAD stenting 10/09 and re-opening 6/10  . Cardiac defibrillator placement     2010 by [facility]    Current Outpatient Prescriptions  Medication Sig Dispense Refill  . acetaminophen (TYLENOL) 325 MG tablet Take 2 tablets (650 mg total) by mouth every 4 (four) hours as needed for pain.  100 tablet  2  . ALPRAZolam (XANAX) 1 MG tablet Take 1 tablet (1 mg total) by mouth 2 (two)  times daily as needed for anxiety.  60 tablet  0  . aspirin 325 MG tablet Take 325 mg by mouth daily.        . carvedilol (COREG) 6.25 MG tablet Take 6.25 mg by mouth 2 (two) times daily with a meal.        . esomeprazole (NEXIUM) 40 MG capsule Take 40 mg by mouth daily before breakfast.        . furosemide (LASIX) 40 MG tablet Take 40 mg by mouth daily.        . isosorbide mononitrate (IMDUR) 30 MG 24 hr tablet Take 30 mg by mouth daily.        Marland Kitchen lisinopril (PRINIVIL,ZESTRIL) 10 MG tablet Take 10 mg by mouth daily.        . nitroGLYCERIN (NITROSTAT) 0.4 MG SL tablet Place 0.4 mg under the tongue every 5 (five) minutes as needed.        . rosuvastatin (CRESTOR) 20 MG tablet Take 20 mg by mouth daily.          Allergies  Allergen Reactions  . Oxycodone Hcl Nausea And Vomiting    History   Social History  . Marital Status: Single    Spouse Name: N/A    Number of Children: 1  . Years of Education: N/A   Occupational History  . disabled  Social History Main Topics  . Smoking status: Current Everyday Smoker -- 1.0 packs/day for 17 years    Types: Cigarettes  . Smokeless tobacco: Not on file  . Alcohol Use: No  . Drug Use: No     REMOTE H/O SUBSTANCE ABUSE  . Sexually Active: Not on file   Other Topics Concern  . Not on file   Social History Narrative   Pt lives with his mom, brother, and 56 yo son in [place].Quit tobacco in Jan. 2011 prior to heart surgery (prior -1/2 PPD (down from 1 ppd per day, onset age 11) alcohol abuse- prior, hasn't drank since age 25no illicit drug use Currently unemployed, awaiting disabilitySingle    Family History  Problem Relation Age of Onset  . Heart attack Father 60    died at 54 after multiple MI's  . COPD Maternal Grandmother    Physical Exam: Filed Vitals:   02/05/11 0944  BP: 123/75  Pulse: 90  Height: 6\' 3"  (1.905 m)  Weight: 266 lb (120.657 kg)    GEN- The patient is well appearing, alert and oriented x 3 today.     Head- normocephalic, atraumatic Eyes-  Sclera clear, conjunctiva pink Ears- hearing intact Oropharynx- clear Neck- supple, no JVP Lymph- no cervical lymphadenopathy Lungs- Clear to ausculation bilaterally, normal work of breathing Chest- ICD pocket is well healed Heart- Regular rate and rhythm, no murmurs, rubs or gallops, PMI not laterally displaced GI- soft, NT, ND, + BS Extremities- no clubbing, cyanosis, or edema MS- no significant deformity or atrophy Skin- no rash or lesion Psych- euthymic mood, full affect Neuro- strength and sensation are intact  ICD interrogation- reviewed in detail today,  See PACEART report  Assessment and Plan:

## 2011-02-05 NOTE — Assessment & Plan Note (Signed)
Cessation strongly advised,  He will "try to quit"

## 2011-02-05 NOTE — Patient Instructions (Signed)
Your physician wants you to follow-up in: 12 months with Dr Allred You will receive a reminder letter in the mail two months in advance. If you don't receive a letter, please call our office to schedule the follow-up appointment.   Remote monitoring is used to monitor your Pacemaker of ICD from home. This monitoring reduces the number of office visits required to check your device to one time per year. It allows us to keep an eye on the functioning of your device to ensure it is working properly. You are scheduled for a device check from home on 05/08/2011 You may send your transmission at any time that day. If you have a wireless device, the transmission will be sent automatically. After your physician reviews your transmission, you will receive a postcard with your next transmission date.   

## 2011-02-05 NOTE — Assessment & Plan Note (Signed)
Stable Normal ICD function See Arita Miss Art report No changes today

## 2011-02-06 ENCOUNTER — Telehealth: Payer: Self-pay | Admitting: *Deleted

## 2011-02-06 NOTE — Telephone Encounter (Signed)
lvm with mother for ptcb. Need to verify if pt is taking his crestor and if so need to increase crestor to 40 mg daily and repeat flp/lft in 8 weeks per scott weaver, pa-c. Danielle Rankin

## 2011-02-06 NOTE — Telephone Encounter (Signed)
lvm x 2 with pt's mother, said she gave pt the message. I told her that I will tcb 02/07/11 to go over results. Danielle Rankin

## 2011-02-07 ENCOUNTER — Telehealth: Payer: Self-pay | Admitting: *Deleted

## 2011-02-07 DIAGNOSIS — E785 Hyperlipidemia, unspecified: Secondary | ICD-10-CM

## 2011-02-07 MED ORDER — ROSUVASTATIN CALCIUM 40 MG PO TABS: 40.0000 mg | ORAL_TABLET | Freq: Every day | ORAL | Status: DC

## 2011-02-07 NOTE — Telephone Encounter (Signed)
Lab orders placed for flp/lft 04/04/11

## 2011-02-07 NOTE — Telephone Encounter (Signed)
pt aware of lab results and states he is taking crestor regulary and I stated that we needed to increase crestor to 40 mg, sent in to [facility] in [place] today.

## 2011-02-10 ENCOUNTER — Ambulatory Visit (INDEPENDENT_AMBULATORY_CARE_PROVIDER_SITE_OTHER): Payer: Medicaid Other | Admitting: Pulmonary Disease

## 2011-02-10 ENCOUNTER — Encounter: Payer: Self-pay | Admitting: Pulmonary Disease

## 2011-02-10 VITALS — BP 122/60 | HR 71 | Temp 97.7°F | Ht 75.0 in | Wt 271.8 lb

## 2011-02-10 DIAGNOSIS — G4733 Obstructive sleep apnea (adult) (pediatric): Secondary | ICD-10-CM

## 2011-02-10 NOTE — Patient Instructions (Signed)
Will arrange for CPAP titration study. Will call to set up CPAP after sleep study reviewed.

## 2011-02-10 NOTE — Progress Notes (Signed)
Chief Complaint  Patient presents with  . Results    Pt here to discuss sleep study results    History of Present Illness: John Davenport is a 35 y.o. male OSA.  He is here to review sleep study from 0/08/12>>AHI 7.8, RDI 14.5, SpO2 low 85%.    Past Medical History  Diagnosis Date  . Ischemic cardiomyopathy     echocardiogram 12/11: Mild LVH, EF 30-35%, anteroseptal and apical akinesis, grade 1 diastolic dysfunction  . Hypercholesteremia   . LBP (low back pain)   . Coronary artery disease     CAD s/p AMI 10/09 and 6/10;  s/p BMS to LAD 12/2007 with subsequent POBA to LAD stent in 08/2008.  He eventually required single vessel CABG in 03/2009 due to restenosis;  Myoview 12/11: No ischemia, large anterior apical scar consistent with prior MI, EF 22%.  . Chronic systolic heart failure     Ischemic CM (EF 30%) with NYHA Class III CHF  . Back pain   . Paroxysmal ventricular tachycardia     s/p AICD  . Esophageal reflux   . Anxiety state, unspecified   . Tobacco abuse   . Heart attack   . OSA (obstructive sleep apnea) 12/30/2010    Past Surgical History  Procedure Date  . Cardiac defibrillator placement   . Coronary artery bypass graft     1/11  . Coronary angioplasty with stent placement     LAD stenting 10/09 and re-opening 6/10  . Cardiac defibrillator placement     2010 by [facility]    Current Outpatient Prescriptions on File Prior to Visit  Medication Sig Dispense Refill  . acetaminophen (TYLENOL) 325 MG tablet Take 2 tablets (650 mg total) by mouth every 4 (four) hours as needed for pain.  100 tablet  2  . ALPRAZolam (XANAX) 1 MG tablet Take 1 tablet (1 mg total) by mouth 2 (two) times daily as needed for anxiety.  60 tablet  0  . aspirin 325 MG tablet Take 325 mg by mouth daily.        . carvedilol (COREG) 6.25 MG tablet Take 6.25 mg by mouth 2 (two) times daily with a meal.        . esomeprazole (NEXIUM) 40 MG capsule Take 40 mg by mouth daily before breakfast.          . furosemide (LASIX) 40 MG tablet Take 40 mg by mouth daily.        . isosorbide mononitrate (IMDUR) 30 MG 24 hr tablet Take 30 mg by mouth daily.        Marland Kitchen lisinopril (PRINIVIL,ZESTRIL) 10 MG tablet Take 10 mg by mouth daily.        . nitroGLYCERIN (NITROSTAT) 0.4 MG SL tablet Place 1 tablet (0.4 mg total) under the tongue every 5 (five) minutes as needed.  30 tablet  11  . rosuvastatin (CRESTOR) 40 MG tablet Take 1 tablet (40 mg total) by mouth daily.  30 tablet  11    Allergies  Allergen Reactions  . Oxycodone Hcl Nausea And Vomiting    Physical Exam:  Blood pressure 122/60, pulse 71, temperature 97.7 F (36.5 C), temperature source Oral, height 6\' 3"  (1.905 m), weight 271 lb 12.8 oz (123.288 kg), SpO2 97.00%.  General - Obese  HEENT - PERRLA, EOMI, No sinus tenderness, MP 3, 2+ tonsills, no oral exudate, no LAN, no thyromegaly  Cardiac - s1s2 regular, no murmur, pulses symmetric  Chest - normal respiratory excursion, no  wheeze/rales/dullness  Abd - obese, soft, nontender  Ext - no e/c/c  Neuro - normal strength, CN intact  Psych - normal mood/behavior  Assessment/Plan:  OSA (obstructive sleep apnea) He has mild sleep apnea.  He has history of cardiovascular disease.  I am concerned he has sleep apnea.  I have reviewed his sleep test results with the patient.  Explained how sleep apnea can affect the patient's health.  Driving precautions and importance of weight loss were discussed.  Treatment options for sleep apnea were reviewed.  Will arrange for in-lab CPAP titration study.  Will set him up with CPAP and then follow up after few weeks of use.       Outpatient Encounter Prescriptions as of 02/10/2011  Medication Sig Dispense Refill  . acetaminophen (TYLENOL) 325 MG tablet Take 2 tablets (650 mg total) by mouth every 4 (four) hours as needed for pain.  100 tablet  2  . ALPRAZolam (XANAX) 1 MG tablet Take 1 tablet (1 mg total) by mouth 2 (two) times daily as needed  for anxiety.  60 tablet  0  . aspirin 325 MG tablet Take 325 mg by mouth daily.        . carvedilol (COREG) 6.25 MG tablet Take 6.25 mg by mouth 2 (two) times daily with a meal.        . esomeprazole (NEXIUM) 40 MG capsule Take 40 mg by mouth daily before breakfast.        . furosemide (LASIX) 40 MG tablet Take 40 mg by mouth daily.        . isosorbide mononitrate (IMDUR) 30 MG 24 hr tablet Take 30 mg by mouth daily.        Marland Kitchen lisinopril (PRINIVIL,ZESTRIL) 10 MG tablet Take 10 mg by mouth daily.        . nitroGLYCERIN (NITROSTAT) 0.4 MG SL tablet Place 1 tablet (0.4 mg total) under the tongue every 5 (five) minutes as needed.  30 tablet  11  . rosuvastatin (CRESTOR) 40 MG tablet Take 1 tablet (40 mg total) by mouth daily.  30 tablet  11    [name] Pager:  [phone] 02/10/2011, 1:42 PM

## 2011-02-10 NOTE — Assessment & Plan Note (Signed)
He has mild sleep apnea.  He has history of cardiovascular disease.  I am concerned he has sleep apnea.  I have reviewed his sleep test results with the patient.  Explained how sleep apnea can affect the patient's health.  Driving precautions and importance of weight loss were discussed.  Treatment options for sleep apnea were reviewed.  Will arrange for in-lab CPAP titration study.  Will set him up with CPAP and then follow up after few weeks of use.

## 2011-02-26 ENCOUNTER — Other Ambulatory Visit: Payer: Self-pay | Admitting: Internal Medicine

## 2011-02-26 NOTE — Telephone Encounter (Signed)
Called [place] [employer], spoke with (a lady) named [name], about getting approval for CRESTOR 40 MG.  Was able to get CRESTOR 40 mg approved for 1 year starting today.  Will call [facility] and advise them that the CRESTOR 40 MG was approved.  Was also informed that the pt's ID was incorrect it should be [id number].  Caralee Ates CMA  Called [facility] [phone] in [place], [place] at [phone], informed them the CRESTOR 40 MG was approved, along with giving them the correct ID number for [name].  Caralee Ates CMA

## 2011-03-04 ENCOUNTER — Encounter (HOSPITAL_BASED_OUTPATIENT_CLINIC_OR_DEPARTMENT_OTHER): Payer: Medicaid Other

## 2011-04-04 ENCOUNTER — Other Ambulatory Visit: Payer: Medicaid Other | Admitting: *Deleted

## 2011-05-08 ENCOUNTER — Encounter: Payer: Medicaid Other | Admitting: *Deleted

## 2011-05-12 ENCOUNTER — Encounter: Payer: Self-pay | Admitting: *Deleted

## 2011-06-09 ENCOUNTER — Encounter: Payer: Self-pay | Admitting: *Deleted

## 2011-08-05 ENCOUNTER — Other Ambulatory Visit (HOSPITAL_COMMUNITY): Payer: Self-pay

## 2011-08-05 ENCOUNTER — Ambulatory Visit (INDEPENDENT_AMBULATORY_CARE_PROVIDER_SITE_OTHER): Payer: Medicaid Other | Admitting: Physician Assistant

## 2011-08-05 ENCOUNTER — Ambulatory Visit (HOSPITAL_COMMUNITY)
Admission: RE | Admit: 2011-08-05 | Discharge: 2011-08-05 | Disposition: A | Discharge status: Home / Self Care | Payer: Medicaid Other | Source: Ambulatory Visit | Attending: Physician Assistant | Admitting: Physician Assistant

## 2011-08-05 ENCOUNTER — Telehealth: Payer: Self-pay | Admitting: *Deleted

## 2011-08-05 ENCOUNTER — Encounter: Payer: Self-pay | Admitting: *Deleted

## 2011-08-05 ENCOUNTER — Encounter: Payer: Self-pay | Admitting: Physician Assistant

## 2011-08-05 VITALS — BP 110/70 | HR 89 | Ht 75.0 in | Wt 255.4 lb

## 2011-08-05 DIAGNOSIS — E7800 Pure hypercholesterolemia, unspecified: Secondary | ICD-10-CM

## 2011-08-05 DIAGNOSIS — I200 Unstable angina: Secondary | ICD-10-CM

## 2011-08-05 DIAGNOSIS — F17200 Nicotine dependence, unspecified, uncomplicated: Secondary | ICD-10-CM

## 2011-08-05 DIAGNOSIS — I5022 Chronic systolic (congestive) heart failure: Secondary | ICD-10-CM

## 2011-08-05 DIAGNOSIS — I2510 Atherosclerotic heart disease of native coronary artery without angina pectoris: Secondary | ICD-10-CM

## 2011-08-05 DIAGNOSIS — K219 Gastro-esophageal reflux disease without esophagitis: Secondary | ICD-10-CM

## 2011-08-05 DIAGNOSIS — I10 Essential (primary) hypertension: Secondary | ICD-10-CM

## 2011-08-05 DIAGNOSIS — R0609 Other forms of dyspnea: Secondary | ICD-10-CM

## 2011-08-05 LAB — PROTIME-INR
INR: 1 ratio (ref 0.8–1.0)
Prothrombin Time: 11 s (ref 10.2–12.4)

## 2011-08-05 LAB — BASIC METABOLIC PANEL
BUN: 12 mg/dL (ref 6–23)
Calcium: 9.2 mg/dL (ref 8.4–10.5)
Creatinine, Ser: 1.3 mg/dL (ref 0.4–1.5)

## 2011-08-05 LAB — CBC WITH DIFFERENTIAL/PLATELET
Basophils Relative: 0.6 % (ref 0.0–3.0)
Eosinophils Absolute: 0.2 10*3/uL (ref 0.0–0.7)
Eosinophils Relative: 1.2 % (ref 0.0–5.0)
Lymphocytes Relative: 23.3 % (ref 12.0–46.0)
Monocytes Absolute: 0.9 10*3/uL (ref 0.1–1.0)
Neutrophils Relative %: 68.5 % (ref 43.0–77.0)
Platelets: 220 10*3/uL (ref 150.0–400.0)
RBC: 5.46 Mil/uL (ref 4.22–5.81)
WBC: 14.3 10*3/uL — ABNORMAL HIGH (ref 4.5–10.5)

## 2011-08-05 LAB — BRAIN NATRIURETIC PEPTIDE: Pro B Natriuretic peptide (BNP): 144 pg/mL — ABNORMAL HIGH (ref 0.0–100.0)

## 2011-08-05 MED ORDER — ESOMEPRAZOLE MAGNESIUM 40 MG PO CPDR: 40.0000 mg | DELAYED_RELEASE_CAPSULE | Freq: Every day | ORAL | Status: DC

## 2011-08-05 MED ORDER — NITROGLYCERIN 0.4 MG SL SUBL: 0.4000 mg | SUBLINGUAL_TABLET | SUBLINGUAL | Status: DC | PRN

## 2011-08-05 MED ORDER — ISOSORBIDE MONONITRATE ER 60 MG PO TB24: 60.0000 mg | ORAL_TABLET | Freq: Every day | ORAL | Status: DC

## 2011-08-05 MED ORDER — CLOPIDOGREL BISULFATE 75 MG PO TABS: 75.0000 mg | ORAL_TABLET | Freq: Every day | ORAL | Status: AC

## 2011-08-05 NOTE — H&P (Signed)
History and Physical  Date: 08/05/2011   Name: John Davenport    DOB: 1975/05/08    MRN: [id number]   PCP: Lyn Hollingshead, MD, MD  Primary Cardiologist/Primary Electrophysiologist: Dr. Hillis Range    History of Present Illness:  John Davenport is a 36 y.o. male who returns for evaluation of chest pain and left hand tingling.   He has a h/o 1v CAD s/p BMS to LAD 12/2007 with subsequent POBA to LAD stent in 08/2008. He eventually required single vessel CABG in 03/2009 due to restenosis. He also has an ICM with EF 30-35%. He is s/p AICD. Last echocardiogram 12/11: Mild LVH, EF 30-35%, anteroseptal and apical akinesis, grade 1 diastolic dysfunction. Last Myoview 12/11: No ischemia, large anterior apical scar consistent with prior MI, EF 22%.   Admitted 10/2010 with CP. MI ruled out. No further workup.   Sleep study suggestive of mild sleep apnea. He is followed up with Dr. Craige Cotta and has been set up for CPAP. He has not obtained CPAP due to cost.  Last seen by Dr. Johney Frame 01/2011. Stable at that time with plans for 12 month follow up.   The patient notes severe "heartburn" and left hand numbness and tingling since late last week. He is a difficult historian. He cannot tell me if exertion has made this symptom worse. He does note dyspnea with exertion that is fairly chronic. Over the last several weeks, he does feel as though this has worsened. He describes NYHA Class III symptoms. He denies syncope. He denies orthopnea or PND or LE edema. He denies any radiation to his jaw. He does note associated nausea and diaphoresis. His heartburn has been fairly consistent since late last week. He has not tried antacids other than his daily Nexium or nitroglycerin. He is running out of his Nexium. He notes that these symptoms are exactly like his symptoms he had with his previous heart attack. At that time, he presented to the hospital with what he thought was nothing more than heartburn.    Wt Readings from  Last 3 Encounters:   08/05/11  255 lb 6.4 oz (115.849 kg)   02/10/11  271 lb 12.8 oz (123.288 kg)   02/05/11  266 lb (120.657 kg)    Potassium   Date/Time  Value  Range  Status   11/21/2010 12:47 AM  4.0  3.5-5.1 (mEq/L)  Final      Creatinine, Ser   Date/Time  Value  Range  Status   11/21/2010 12:47 AM  1.26  0.50-1.35 (mg/dL)  Final      ALT   Date/Time  Value  Range  Status   02/05/2011 10:18 AM  43  0-53 (U/L)  Final    Lab Results   Component  Value  Date    LDLCALC  112*  02/05/2011      Past Medical History   Diagnosis  Date   .  Ischemic cardiomyopathy      echocardiogram 12/11: Mild LVH, EF 30-35%, anteroseptal and apical akinesis, grade 1 diastolic dysfunction   .  Hypercholesteremia    .  LBP (low back pain)    .  Coronary artery disease      CAD s/p AMI 10/09 and 6/10; s/p BMS to LAD 12/2007 with subsequent POBA to LAD stent in 08/2008. He eventually required single vessel CABG in 03/2009 due to restenosis; Myoview 12/11: No ischemia, large anterior apical scar consistent with prior MI, EF 22%.   .  Chronic systolic  heart failure      Ischemic CM (EF 30%) with NYHA Class III CHF   .  Back pain    .  Paroxysmal ventricular tachycardia      s/p AICD   .  Esophageal reflux    .  Anxiety state, unspecified    .  Tobacco abuse    .  Heart attack    .  OSA (obstructive sleep apnea)  12/30/2010     Current Outpatient Prescriptions   Medication  Sig  Dispense  Refill   .  ALPRAZolam (XANAX) 1 MG tablet  Take 1 tablet (1 mg total) by mouth 2 (two) times daily as needed for anxiety.  60 tablet  0   .  aspirin 325 MG tablet  Take 325 mg by mouth daily.     .  carvedilol (COREG) 6.25 MG tablet  Take 6.25 mg by mouth 2 (two) times daily with a meal.     .  esomeprazole (NEXIUM) 40 MG capsule  Take 40 mg by mouth daily before breakfast.     .  furosemide (LASIX) 40 MG tablet  Take 40 mg by mouth daily.     .  isosorbide mononitrate (IMDUR) 30 MG 24 hr tablet  Take 30 mg by  mouth daily.     Marland Kitchen  lisinopril (PRINIVIL,ZESTRIL) 10 MG tablet  Take 10 mg by mouth daily.     .  nitroGLYCERIN (NITROSTAT) 0.4 MG SL tablet  Place 1 tablet (0.4 mg total) under the tongue every 5 (five) minutes as needed.  30 tablet  11   .  rosuvastatin (CRESTOR) 40 MG tablet  Take 1 tablet (40 mg total) by mouth daily.  30 tablet  11     Allergies:  Allergies   Allergen  Reactions   .  Oxycodone Hcl  Nausea And Vomiting     History   Substance Use Topics   .  Smoking status:  Current Everyday Smoker -- 1.0 packs/day for 17 years     Types:  Cigarettes   .  Smokeless tobacco:  Not on file   .  Alcohol Use:  No     Family History   Problem  Relation  Age of Onset   .  Heart attack  Father  24      died at 54 after multiple MI's    .  COPD  Maternal Grandmother      ROS: Please see the history of present illness. All other systems reviewed and negative.   PHYSICAL EXAM:  VS: BP 110/70  Pulse 89  Ht 6\' 3"  (1.905 m)  Wt 255 lb 6.4 oz (115.849 kg)  BMI 31.92 kg/m2  Well nourished, well developed, in no acute distress  HEENT: normal  Neck: no JVD  Endocrine: no thyromegaly  Cardiac: normal S1, S2; RRR; no murmur  Lungs: Decreased breath sounds bilaterally, no wheezing, rhonchi or rales  Abd: soft, nontender, no hepatomegaly  Ext: no edema  Skin: warm and dry  Neuro: CNs 2-12 intact, no focal abnormalities noted  Psych: normal affect   EKG: Sinus rhythm, heart rate 89, normal axis, interventricular conduction delay, poor R-wave progression, no change from prior tracings   ASSESSMENT AND PLAN:   1. Unstable Angina I recommended admission to the hospital with plans for cardiac catheterization. The patient has a date in court tomorrow for his disability. He refuses admission to the hospital today.  I discussed his case with Dr. Patty Sermons (DOD). He  agreed.  Since the patient refuses admission to the hospital, we will arrange outpatient cardiac catheterization later this  week.  Continue ASA. Start Plavix 75 mg daily.  Increase Imdur to 60 mg daily.  Refill NTG prn.  Check labs today: Basic metabolic panel, CBC, BMP, PT/INR, troponin.  If his troponin is abnormal, we will call him and again recommend admission to the hospital.  I have had him sign a letter today that states he understands the risk he is taking by refusing admission to the hospital today and it is in his chart. Risks and benefits of cardiac catheterization have been discussed with the patient. These include bleeding, infection, kidney damage, stroke, heart attack, death. The patient understands these risks and is willing to proceed.   2. Coronary Artery Disease As above. Continue ASA and add Plavix. Refill NTG.   3. Chronic Systolic CHF secondary to Ischemic Cardiomyopathy Volume appears stable.  Check BNP and CXR today.   4. GERD His symptoms may all be simply from GERD. However, he states he last had heartburn like this with his MI.  He needs cardiac evaluation first.  If cath ok, refer to GI.  5. Hypertension Controlled. Continue current therapy.   6. Hyperlipidemia Continue Crestor.   7. Tobacco abuse He knows he needs to quit.        8. Left Hand Numbness  Needs cardiac eval first  If cath ok, hand numbness may just simply be carpal tunnel and would recommend follow up with PCP at that point.    SignedTereso Newcomer, PA-C 11:20 AM 08/05/2011

## 2011-08-05 NOTE — Progress Notes (Signed)
[address] [place], [place]  [address] Phone: [phone] Fax:  [phone]  Date:  08/05/2011   Name:  John Davenport   DOB:  Jul 31, 1975   MRN:  [id number]  PCP:  Lyn Hollingshead, MD, MD  Primary Cardiologist/Primary Electrophysiologist:  Dr. Hillis Range    History of Present Illness: John Davenport is a 36 y.o. male who returns for evaluation of chest pain and left hand tingling.    He has a h/o 1v CAD s/p BMS to LAD 12/2007 with subsequent POBA to LAD stent in 08/2008.  He eventually required single vessel CABG in 03/2009 due to restenosis. He also has an ICM with EF 30-35%. He is s/p AICD. Last echocardiogram 12/11: Mild LVH, EF 30-35%, anteroseptal and apical akinesis, grade 1 diastolic dysfunction. Last Myoview 12/11: No ischemia, large anterior apical scar consistent with prior MI, EF 22%.   Admitted 10/2010 with CP.  MI ruled out.  No further workup.  Sleep study suggestive of mild sleep apnea.  He is followed up with Dr. Craige Cotta and has been set up for CPAP.  He has not obtained CPAP due to cost.    Last seen by Dr. Johney Frame 01/2011.  Stable at that time with plans for 12 month follow up.  The patient notes severe "heartburn" and left hand numbness and tingling since late last week.  He is a difficult historian.  He cannot tell me if exertion has made this symptom worse.  He does note dyspnea with exertion that is fairly chronic.  Over the last several weeks, he does feel as though this has worsened.  He describes NYHA Class III symptoms.  He denies syncope.  He denies orthopnea or PND or LE edema.  He denies any radiation to his jaw.  He does note associated nausea and diaphoresis.  His heartburn has been fairly consistent since late last week.  He has not tried antacids other than his daily Nexium or nitroglycerin.  He is running out of his Nexium.  He notes that these symptoms are exactly like his symptoms he had with his previous heart attack.  At that  time, he presented to the hospital with what he thought was nothing more than heartburn.  Wt Readings from Last 3 Encounters:  08/05/11 255 lb 6.4 oz (115.849 kg)  02/10/11 271 lb 12.8 oz (123.288 kg)  02/05/11 266 lb (120.657 kg)     Potassium  Date/Time Value Range Status  11/21/2010 12:47 AM 4.0  3.5-5.1 (mEq/L) Final     Creatinine, Ser  Date/Time Value Range Status  11/21/2010 12:47 AM 1.26  0.50-1.35 (mg/dL) Final     ALT  Date/Time Value Range Status  02/05/2011 10:18 AM 43  0-53 (U/L) Final    Lab Results  Component Value Date   LDLCALC 112* 02/05/2011    Past Medical History  Diagnosis Date  . Ischemic cardiomyopathy     echocardiogram 12/11: Mild LVH, EF 30-35%, anteroseptal and apical akinesis, grade 1 diastolic dysfunction  . Hypercholesteremia   . LBP (low back pain)   . Coronary artery disease     CAD s/p AMI 10/09 and 6/10;  s/p BMS to LAD 12/2007 with subsequent POBA to LAD stent in 08/2008.  He eventually required single vessel CABG in 03/2009 due to restenosis;  Myoview 12/11: No ischemia, large anterior apical scar consistent with prior MI, EF 22%.  . Chronic systolic heart failure     Ischemic CM (EF 30%) with NYHA  Class III CHF  . Back pain   . Paroxysmal ventricular tachycardia     s/p AICD  . Esophageal reflux   . Anxiety state, unspecified   . Tobacco abuse   . Heart attack   . OSA (obstructive sleep apnea) 12/30/2010    Current Outpatient Prescriptions  Medication Sig Dispense Refill  . ALPRAZolam (XANAX) 1 MG tablet Take 1 tablet (1 mg total) by mouth 2 (two) times daily as needed for anxiety.  60 tablet  0  . aspirin 325 MG tablet Take 325 mg by mouth daily.        . carvedilol (COREG) 6.25 MG tablet Take 6.25 mg by mouth 2 (two) times daily with a meal.        . esomeprazole (NEXIUM) 40 MG capsule Take 40 mg by mouth daily before breakfast.        . furosemide (LASIX) 40 MG tablet Take 40 mg by mouth daily.        . isosorbide mononitrate  (IMDUR) 30 MG 24 hr tablet Take 30 mg by mouth daily.        Marland Kitchen lisinopril (PRINIVIL,ZESTRIL) 10 MG tablet Take 10 mg by mouth daily.        . nitroGLYCERIN (NITROSTAT) 0.4 MG SL tablet Place 1 tablet (0.4 mg total) under the tongue every 5 (five) minutes as needed.  30 tablet  11  . rosuvastatin (CRESTOR) 40 MG tablet Take 1 tablet (40 mg total) by mouth daily.  30 tablet  11    Allergies: Allergies  Allergen Reactions  . Oxycodone Hcl Nausea And Vomiting    History  Substance Use Topics  . Smoking status: Current Everyday Smoker -- 1.0 packs/day for 17 years    Types: Cigarettes  . Smokeless tobacco: Not on file  . Alcohol Use: No     Family History  Problem Relation Age of Onset  . Heart attack Father 85    died at 51 after multiple MI's  . COPD Maternal Grandmother      ROS:  Please see the history of present illness.    All other systems reviewed and negative.   PHYSICAL EXAM: VS:  BP 110/70  Pulse 89  Ht 6\' 3"  (1.905 m)  Wt 255 lb 6.4 oz (115.849 kg)  BMI 31.92 kg/m2 Well nourished, well developed, in no acute distress HEENT: normal Neck: no JVD Endocrine: no thyromegaly Cardiac:  normal S1, S2; RRR; no murmur Lungs:  Decreased breath sounds bilaterally, no wheezing, rhonchi or rales Abd: soft, nontender, no hepatomegaly Ext: no edema Skin: warm and dry Neuro:  CNs 2-12 intact, no focal abnormalities noted Psych: normal affect  EKG:  Sinus rhythm, heart rate 89, normal axis, interventricular conduction delay, poor R-wave progression, no change from prior tracings   ASSESSMENT AND PLAN:  1. Unstable Angina  I recommended admission to the hospital with plans for cardiac catheterization.  The patient has a date in court tomorrow for his disability.  He refuses admission to the hospital today.  I discussed his case with Dr. Patty Sermons (DOD).  He agreed.  Since the patient refuses admission to the hospital, we will arrange outpatient cardiac catheterization  later this week.  Continue ASA.  Start Plavix 75 mg daily.  Increase Imdur to 60 mg daily.   Refill NTG prn.    Check labs today: Basic metabolic panel, CBC, BMP, PT/INR, troponin.  If his troponin is abnormal, we will call him again recommend admission to the hospital.  I have had him sign a letter today that states he understands the risk he is taking by refusing admission to the hospital today and it is in his chart. Risks and benefits of cardiac catheterization have been discussed with the patient.  These include bleeding, infection, kidney damage, stroke, heart attack, death.  The patient understands these risks and is willing to proceed.   2. Coronary Artery Disease  As above.  Continue ASA and add Plavix.  Refill NTG.   3. Chronic Systolic CHF secondary to Ischemic Cardiomyopathy  Volume appears stable.  Check BNP and CXR today.    4. GERD  His symptoms may all be simply from GERD.  However, he states he last had heartburn like this with his MI.  He needs cardiac evaluation first.  If cath ok, refer to GI.  5. Hypertension Controlled.  Continue current therapy.   6. Hyperlipidemia  Continue Crestor.   7. Tobacco abuse  He knows he needs to quit.  8.  Left Hand Numbness  Needs cardiac eval first  If cath ok, hand numbness may just simply be carpal tunnel and would recommend follow up with PCP at that point.    SignedTereso Newcomer, PA-C  11:20 AM 08/05/2011

## 2011-08-05 NOTE — Patient Instructions (Signed)
Your physician has requested that you have a LEFT cardiac catheterization ON 08/07/11 WITH DR. Eden Emms @ 9 AM. Cardiac catheterization is used to diagnose and/or treat various heart conditions. Doctors may recommend this procedure for a number of different reasons. The most common reason is to evaluate chest pain. Chest pain can be a symptom of coronary artery disease (CAD), and cardiac catheterization can show whether plaque is narrowing or blocking your heart's arteries. This procedure is also used to evaluate the valves, as well as measure the blood flow and oxygen levels in different parts of your heart. For further information please visit [email or web address] Please follow instruction sheet, as given.  Your physician recommends that you return for lab work in: TODAY PRE CATH LABS AND STAT TROPONIN I  INCREASE IMDUR TO 60 MG DAILY START PLAVIX 75 MG 1 TABLET DAILY REFILL FOR NEXIUM AND NTG HAVE BEN SENT IN TODAY  PLEASE GO TO [facility] TODAY FOR A CHEST XRAY

## 2011-08-05 NOTE — Telephone Encounter (Signed)
Message copied by Tarri Fuller on Tue Aug 05, 2011  3:04 PM ------      Message from: [place], [place] T      Created: Tue Aug 05, 2011  2:30 PM       Troponin is abnormal.      He should go to the emergency room now for admission to the hospital.      If still having symptoms, he should call 911 to be taken by ambulance.      Tereso Newcomer, PA-C  2:30 PM 08/05/2011

## 2011-08-05 NOTE — H&P (Signed)
I reviewed this case with Tereso Newcomer, PA-C.  Agree that his symptoms are very concerning in this young man with previous significant ischemic heart disease. Agree with plans for cath.

## 2011-08-05 NOTE — Telephone Encounter (Signed)
S/w pt's mother who told me I could call pt on his cell phone [phone]. STAT troponin I came back 0.30. Pt will be advised to go to [facility] ED. Pt is already scheduled for 5/9 for a left heart cath with Dr. Eden Emms. I s/w pt on his cell phone and he has been advised per Tereso Newcomer, PAC that STAT troponin I indicates pt may have had MI. Pt agrees to go to [facility] ED now, pt did refuse to go by ambulance though said he could not afford this. I s/w Trish @ [facility] and she has been notified that pt is on his way shortly, I let Rosann Auerbach now that pt is already scheduled for a left heart cath 5/9 with Dr. Eden Emms.

## 2011-08-06 ENCOUNTER — Telehealth: Payer: Self-pay | Admitting: *Deleted

## 2011-08-06 NOTE — Telephone Encounter (Signed)
called to give pt his lab/cxr results and I s/w mother because pt was on his way to the court house for his disability hearing. told mother cxr/lab were good. I asked if pt ever went to [facility] ED as instructed yesterday due to 0.30 Troponin I. Pt's mother stated he did not go because he cannot afford these bills he is accumulating and he had to be in court today for his disability hearing per his lawyer otherwise they would have to start all over.

## 2011-08-06 NOTE — Telephone Encounter (Signed)
Message copied by Tarri Fuller on Wed Aug 06, 2011 11:01 AM ------      Message from: [place], [place] T      Created: Tue Aug 05, 2011  5:36 PM       Please notify patient that the lab results are ok.      Patient sent to hospital because of abnormal troponin.      Tereso Newcomer, PA-C  5:36 PM 08/05/2011

## 2011-08-07 ENCOUNTER — Ambulatory Visit (HOSPITAL_COMMUNITY)
Admission: RE | Admit: 2011-08-07 | Discharge: 2011-08-07 | Disposition: A | Discharge status: Home / Self Care | Payer: Medicaid Other | Source: Ambulatory Visit | Attending: Cardiovascular Disease | Admitting: Cardiovascular Disease

## 2011-08-07 ENCOUNTER — Other Ambulatory Visit: Payer: Self-pay | Admitting: Physician Assistant

## 2011-08-07 ENCOUNTER — Encounter (HOSPITAL_COMMUNITY)
Admission: RE | Disposition: A | Discharge status: Home / Self Care | Payer: Self-pay | Source: Ambulatory Visit | Attending: Cardiovascular Disease

## 2011-08-07 ENCOUNTER — Other Ambulatory Visit: Payer: Self-pay | Admitting: Cardiovascular Disease

## 2011-08-07 DIAGNOSIS — I509 Heart failure, unspecified: Secondary | ICD-10-CM | POA: Insufficient documentation

## 2011-08-07 DIAGNOSIS — I5022 Chronic systolic (congestive) heart failure: Secondary | ICD-10-CM | POA: Insufficient documentation

## 2011-08-07 DIAGNOSIS — R079 Chest pain, unspecified: Secondary | ICD-10-CM

## 2011-08-07 DIAGNOSIS — Z951 Presence of aortocoronary bypass graft: Secondary | ICD-10-CM | POA: Insufficient documentation

## 2011-08-07 DIAGNOSIS — I2589 Other forms of chronic ischemic heart disease: Secondary | ICD-10-CM | POA: Insufficient documentation

## 2011-08-07 DIAGNOSIS — I2510 Atherosclerotic heart disease of native coronary artery without angina pectoris: Secondary | ICD-10-CM | POA: Insufficient documentation

## 2011-08-07 DIAGNOSIS — I200 Unstable angina: Secondary | ICD-10-CM

## 2011-08-07 DIAGNOSIS — Z9861 Coronary angioplasty status: Secondary | ICD-10-CM | POA: Insufficient documentation

## 2011-08-07 HISTORY — PX: LEFT HEART CATHETERIZATION WITH CORONARY ANGIOGRAM: SHX5451

## 2011-08-07 SURGERY — LEFT HEART CATHETERIZATION WITH CORONARY ANGIOGRAM
Anesthesia: LOCAL

## 2011-08-07 MED ORDER — ACETAMINOPHEN 325 MG PO TABS: 650.0000 mg | ORAL_TABLET | ORAL | Status: DC | PRN

## 2011-08-07 MED ORDER — ASPIRIN 81 MG PO CHEW
324.0000 mg | CHEWABLE_TABLET | ORAL | Status: AC
  Administered 2011-08-07: 324 mg via ORAL
  Filled 2011-08-07: qty 4

## 2011-08-07 MED ORDER — SODIUM CHLORIDE 0.9 % IV SOLN: 250.0000 mL | INTRAVENOUS | Status: DC

## 2011-08-07 MED ORDER — SODIUM CHLORIDE 0.9 % IJ SOLN: 3.0000 mL | Freq: Two times a day (BID) | INTRAMUSCULAR | Status: DC

## 2011-08-07 MED ORDER — HEPARIN (PORCINE) IN NACL 2-0.9 UNIT/ML-% IJ SOLN
INTRAMUSCULAR | Status: AC
  Filled 2011-08-07: qty 1000

## 2011-08-07 MED ORDER — ASPIRIN EC 325 MG PO TBEC: 325.0000 mg | DELAYED_RELEASE_TABLET | Freq: Every day | ORAL | Status: DC

## 2011-08-07 MED ORDER — SODIUM CHLORIDE 0.9 % IJ SOLN: 3.0000 mL | INTRAMUSCULAR | Status: DC | PRN

## 2011-08-07 MED ORDER — SODIUM CHLORIDE 0.9 % IV SOLN
INTRAVENOUS | Status: DC
  Administered 2011-08-07: 1000 mL via INTRAVENOUS

## 2011-08-07 MED ORDER — MIDAZOLAM HCL 2 MG/2ML IJ SOLN
INTRAMUSCULAR | Status: AC
  Filled 2011-08-07: qty 2

## 2011-08-07 MED ORDER — DIAZEPAM 2 MG PO TABS: 2.0000 mg | ORAL_TABLET | ORAL | Status: DC | PRN

## 2011-08-07 MED ORDER — NITROGLYCERIN 0.2 MG/ML ON CALL CATH LAB
INTRAVENOUS | Status: AC
  Filled 2011-08-07: qty 1

## 2011-08-07 MED ORDER — LIDOCAINE HCL (PF) 1 % IJ SOLN
INTRAMUSCULAR | Status: AC
  Filled 2011-08-07: qty 30

## 2011-08-07 MED ORDER — SODIUM CHLORIDE 0.9 % IV SOLN: 250.0000 mL | INTRAVENOUS | Status: DC | PRN

## 2011-08-07 MED ORDER — FUROSEMIDE 10 MG/ML IJ SOLN
INTRAMUSCULAR | Status: AC
  Filled 2011-08-07: qty 4

## 2011-08-07 MED ORDER — ONDANSETRON HCL 4 MG/2ML IJ SOLN: 4.0000 mg | Freq: Four times a day (QID) | INTRAMUSCULAR | Status: DC | PRN

## 2011-08-07 MED ORDER — FUROSEMIDE 40 MG PO TABS: 40.0000 mg | ORAL_TABLET | Freq: Every day | ORAL | Status: DC

## 2011-08-07 MED ORDER — FENTANYL CITRATE 0.05 MG/ML IJ SOLN
INTRAMUSCULAR | Status: AC
  Filled 2011-08-07: qty 2

## 2011-08-07 NOTE — Telephone Encounter (Signed)
Pt needs refill asap just discharged from short stay

## 2011-08-07 NOTE — CV Procedure (Signed)
   Cardiac Catheterization Procedure Note  Name: John Davenport MRN: [id number] DOB: 1976-03-07  Procedure: Left Heart Cath, Selective Coronary Angiography, LV angiography  Indication: Chest pain and numbness in left hand   Procedural details: The right groin was prepped, draped, and anesthetized with 1% lidocaine. Using modified Seldinger technique, a 5 French sheath was introduced into the right femoral artery. Standard Judkins catheters were used for coronary angiography and left ventriculography. Catheter exchanges were performed over a guidewire. There were no immediate procedural complications. The patient was transferred to the post catheterization recovery area for further monitoring.  Procedural Findings: Hemodynamics:  AO 103 75 LV 103 8   Coronary angiography: Coronary dominance: right  Left mainstem: Normal  Left anterior descending (LAD): ostial occlusion at previous stent sight  Left circumflex (LCx): normal  OM1: normal  OM2: normal  Right coronary artery (RCA): [place] take off.  Normal including PDA and 3 posterior lateral branches  LIMA:  Patent sequentially to diagonals with retrograde filling of LAD  Left ventriculography: Left ventricular systolic function is severely reduced with anterior apical akinesis and global hypokinesis  EF 25% with mild mitral regurgitation   Final Conclusions:  Patent LIMA sequential to diagonals with LAD filling retrograde.  No new disease in RCA or circumflex.  Ischemic DCM with AICD.  EDP is low Continue medical Rx.  Outpatient w/u of shoulder or neck regarding hand numbness   Charlton Haws 08/07/2011, 9:16 AM

## 2011-08-07 NOTE — Interval H&P Note (Signed)
History and Physical Interval Note:  08/07/2011 8:36 AM  John Davenport  has presented today for surgery, with the diagnosis of Chest Pain  The various methods of treatment have been discussed with the patient and family. After consideration of risks, benefits and other options for treatment, the patient has consented to  Procedure(s) (LRB): LEFT HEART CATHETERIZATION WITH CORONARY ANGIOGRAM (N/A) as a surgical intervention .  The patients' history has been reviewed, patient examined, no change in status, stable for surgery.  I have reviewed the patients' chart and labs.  Questions were answered to the patient's satisfaction.     Charlton Haws

## 2011-08-07 NOTE — Progress Notes (Signed)
Pt walked in hallway without difficulty. Right groin dressing CDI, site unremarkable.

## 2011-08-07 NOTE — Discharge Instructions (Signed)

## 2011-08-07 NOTE — Telephone Encounter (Signed)
Sent refill for furosemide to [facility]  And notified patient

## 2011-08-22 ENCOUNTER — Telehealth: Payer: Self-pay | Admitting: Physician Assistant

## 2011-08-22 DIAGNOSIS — K219 Gastro-esophageal reflux disease without esophagitis: Secondary | ICD-10-CM

## 2011-08-22 MED ORDER — PANTOPRAZOLE SODIUM 40 MG PO TBEC: 40.0000 mg | DELAYED_RELEASE_TABLET | Freq: Every day | ORAL | Status: DC

## 2011-08-22 NOTE — Telephone Encounter (Signed)
Protonix sent to pharmacy Tereso Newcomer, PA-C  4:50 PM 08/22/2011

## 2011-08-22 NOTE — Telephone Encounter (Signed)
Message copied by Beatrice Lecher on Fri Aug 22, 2011  4:47 PM ------      Message from: [place], [place]      Created: Fri Aug 22, 2011  3:18 PM      Regarding: Nexium       John Davenport,                I called to get prior authorization for Nexium, he has tried omeprazole, and famotidine in the past, but MEDICAID required he tries one more prefer Rx, either Prevacid OTC, or Protonic, they will pay for either one as long as you send in a Rx to the pharmacy.            Thanks,       [place]

## 2011-08-27 ENCOUNTER — Other Ambulatory Visit: Payer: Self-pay

## 2011-08-27 DIAGNOSIS — K219 Gastro-esophageal reflux disease without esophagitis: Secondary | ICD-10-CM

## 2011-08-27 MED ORDER — PANTOPRAZOLE SODIUM 40 MG PO TBEC: 40.0000 mg | DELAYED_RELEASE_TABLET | Freq: Every day | ORAL | Status: DC

## 2011-08-30 ENCOUNTER — Other Ambulatory Visit: Payer: Self-pay

## 2012-04-13 ENCOUNTER — Encounter: Payer: Self-pay | Admitting: *Deleted

## 2012-07-02 ENCOUNTER — Telehealth: Payer: Self-pay | Admitting: Internal Medicine

## 2012-07-02 NOTE — Telephone Encounter (Signed)
07-02-12 pt's home number is mother shelby, left message with her to have pt call to schedule past due defib check, with  Brooke or allred/mt

## 2012-08-31 ENCOUNTER — Encounter: Payer: Self-pay | Admitting: Internal Medicine

## 2012-08-31 ENCOUNTER — Telehealth: Payer: Self-pay | Admitting: Internal Medicine

## 2012-08-31 NOTE — Telephone Encounter (Signed)
08-31-12 sent pt past due letter/mt

## 2012-10-14 ENCOUNTER — Telehealth: Payer: Self-pay | Admitting: Internal Medicine

## 2012-10-14 NOTE — Telephone Encounter (Signed)
10-14-12 no response to past due letter 08-30-12, called @ 1008am and left message with wife to have pt call, past due defib check, see's allred/brooke if possible/mt

## 2012-11-01 ENCOUNTER — Telehealth: Payer: Self-pay | Admitting: Internal Medicine

## 2012-11-01 ENCOUNTER — Encounter: Payer: Self-pay | Admitting: Internal Medicine

## 2012-11-01 NOTE — Telephone Encounter (Addendum)
08-31-12 sent past due letter/mt 10-14-12 left message w/pt's wife, pt to call to set up past due defib checks with Allred/mt 11-01-12 sent certified letter/mt 11-16-12 certified letter signed by pt and rtn/mt

## 2014-03-09 ENCOUNTER — Encounter (HOSPITAL_COMMUNITY): Payer: Self-pay | Admitting: Cardiovascular Disease

## 2015-09-05 ENCOUNTER — Encounter (HOSPITAL_COMMUNITY): Payer: Self-pay | Admitting: Emergency Medicine

## 2015-09-05 ENCOUNTER — Inpatient Hospital Stay (HOSPITAL_COMMUNITY)
Admission: EM | Admit: 2015-09-05 | Discharge: 2015-09-07 | DRG: 917 | Disposition: A | Discharge status: Home / Self Care | Payer: Medicare Other | Attending: Interventional Cardiology | Admitting: Interventional Cardiology

## 2015-09-05 ENCOUNTER — Emergency Department (HOSPITAL_COMMUNITY): Payer: Medicare Other

## 2015-09-05 DIAGNOSIS — G4733 Obstructive sleep apnea (adult) (pediatric): Secondary | ICD-10-CM | POA: Diagnosis present

## 2015-09-05 DIAGNOSIS — Z951 Presence of aortocoronary bypass graft: Secondary | ICD-10-CM

## 2015-09-05 DIAGNOSIS — N289 Disorder of kidney and ureter, unspecified: Secondary | ICD-10-CM

## 2015-09-05 DIAGNOSIS — Z885 Allergy status to narcotic agent status: Secondary | ICD-10-CM | POA: Diagnosis not present

## 2015-09-05 DIAGNOSIS — Z720 Tobacco use: Secondary | ICD-10-CM

## 2015-09-05 DIAGNOSIS — F1410 Cocaine abuse, uncomplicated: Secondary | ICD-10-CM | POA: Diagnosis present

## 2015-09-05 DIAGNOSIS — F17210 Nicotine dependence, cigarettes, uncomplicated: Secondary | ICD-10-CM | POA: Diagnosis not present

## 2015-09-05 DIAGNOSIS — I472 Ventricular tachycardia: Secondary | ICD-10-CM | POA: Diagnosis present

## 2015-09-05 DIAGNOSIS — K219 Gastro-esophageal reflux disease without esophagitis: Secondary | ICD-10-CM | POA: Diagnosis present

## 2015-09-05 DIAGNOSIS — I252 Old myocardial infarction: Secondary | ICD-10-CM

## 2015-09-05 DIAGNOSIS — I214 Non-ST elevation (NSTEMI) myocardial infarction: Secondary | ICD-10-CM | POA: Diagnosis not present

## 2015-09-05 DIAGNOSIS — Z79899 Other long term (current) drug therapy: Secondary | ICD-10-CM | POA: Diagnosis not present

## 2015-09-05 DIAGNOSIS — Z95810 Presence of automatic (implantable) cardiac defibrillator: Secondary | ICD-10-CM

## 2015-09-05 DIAGNOSIS — I5022 Chronic systolic (congestive) heart failure: Secondary | ICD-10-CM | POA: Diagnosis not present

## 2015-09-05 DIAGNOSIS — Z8249 Family history of ischemic heart disease and other diseases of the circulatory system: Secondary | ICD-10-CM | POA: Diagnosis not present

## 2015-09-05 DIAGNOSIS — I2510 Atherosclerotic heart disease of native coronary artery without angina pectoris: Secondary | ICD-10-CM | POA: Diagnosis not present

## 2015-09-05 DIAGNOSIS — T405X1A Poisoning by cocaine, accidental (unintentional), initial encounter: Principal | ICD-10-CM | POA: Diagnosis present

## 2015-09-05 DIAGNOSIS — E785 Hyperlipidemia, unspecified: Secondary | ICD-10-CM

## 2015-09-05 DIAGNOSIS — E7800 Pure hypercholesterolemia, unspecified: Secondary | ICD-10-CM | POA: Diagnosis present

## 2015-09-05 DIAGNOSIS — T82855A Stenosis of coronary artery stent, initial encounter: Secondary | ICD-10-CM | POA: Diagnosis present

## 2015-09-05 DIAGNOSIS — F17200 Nicotine dependence, unspecified, uncomplicated: Secondary | ICD-10-CM | POA: Diagnosis present

## 2015-09-05 DIAGNOSIS — R0602 Shortness of breath: Secondary | ICD-10-CM | POA: Diagnosis not present

## 2015-09-05 DIAGNOSIS — I5042 Chronic combined systolic (congestive) and diastolic (congestive) heart failure: Secondary | ICD-10-CM | POA: Diagnosis present

## 2015-09-05 DIAGNOSIS — Z7982 Long term (current) use of aspirin: Secondary | ICD-10-CM

## 2015-09-05 DIAGNOSIS — I255 Ischemic cardiomyopathy: Secondary | ICD-10-CM

## 2015-09-05 DIAGNOSIS — R0789 Other chest pain: Secondary | ICD-10-CM | POA: Diagnosis present

## 2015-09-05 DIAGNOSIS — R079 Chest pain, unspecified: Secondary | ICD-10-CM | POA: Diagnosis not present

## 2015-09-05 HISTORY — DX: Infection and inflammatory reaction due to other cardiac and vascular devices, implants and grafts, initial encounter: T82.7XXA

## 2015-09-05 LAB — RAPID URINE DRUG SCREEN, HOSP PERFORMED
Amphetamines: NOT DETECTED
BENZODIAZEPINES: NOT DETECTED
Barbiturates: NOT DETECTED
COCAINE: POSITIVE — AB
OPIATES: POSITIVE — AB
Tetrahydrocannabinol: NOT DETECTED

## 2015-09-05 LAB — I-STAT TROPONIN, ED: TROPONIN I, POC: 10.96 ng/mL — AB (ref 0.00–0.08)

## 2015-09-05 LAB — TROPONIN I
Troponin I: 10.04 ng/mL (ref <0.031)
Troponin I: 6.66 ng/mL (ref <0.031)

## 2015-09-05 LAB — HEPATIC FUNCTION PANEL
ALT: 37 U/L (ref 17–63)
AST: 53 U/L — ABNORMAL HIGH (ref 15–41)
Albumin: 3.1 g/dL — ABNORMAL LOW (ref 3.5–5.0)
Alkaline Phosphatase: 59 U/L (ref 38–126)
Bilirubin, Direct: 0.1 mg/dL (ref 0.1–0.5)
Indirect Bilirubin: 0.5 mg/dL (ref 0.3–0.9)
Total Bilirubin: 0.6 mg/dL (ref 0.3–1.2)
Total Protein: 6.3 g/dL — ABNORMAL LOW (ref 6.5–8.1)

## 2015-09-05 LAB — MAGNESIUM: Magnesium: 1.9 mg/dL (ref 1.7–2.4)

## 2015-09-05 LAB — BASIC METABOLIC PANEL
ANION GAP: 7 (ref 5–15)
BUN: 13 mg/dL (ref 6–20)
CALCIUM: 9.6 mg/dL (ref 8.9–10.3)
CO2: 28 mmol/L (ref 22–32)
Chloride: 106 mmol/L (ref 101–111)
Creatinine, Ser: 1.46 mg/dL — ABNORMAL HIGH (ref 0.61–1.24)
GFR, EST NON AFRICAN AMERICAN: 58 mL/min — AB (ref >60)
GLUCOSE: 68 mg/dL (ref 65–99)
POTASSIUM: 4.1 mmol/L (ref 3.5–5.1)
Sodium: 141 mmol/L (ref 135–145)

## 2015-09-05 LAB — CBC
HEMATOCRIT: 46.8 % (ref 39.0–52.0)
HEMOGLOBIN: 15.1 g/dL (ref 13.0–17.0)
MCH: 29 pg (ref 26.0–34.0)
MCHC: 32.3 g/dL (ref 30.0–36.0)
MCV: 90 fL (ref 78.0–100.0)
Platelets: 203 10*3/uL (ref 150–400)
RBC: 5.2 MIL/uL (ref 4.22–5.81)
RDW: 13.5 % (ref 11.5–15.5)
WBC: 14.7 10*3/uL — ABNORMAL HIGH (ref 4.0–10.5)

## 2015-09-05 LAB — TSH: TSH: 1.973 u[IU]/mL (ref 0.350–4.500)

## 2015-09-05 LAB — HEPARIN LEVEL (UNFRACTIONATED): Heparin Unfractionated: 0.21 [IU]/mL — ABNORMAL LOW (ref 0.30–0.70)

## 2015-09-05 LAB — PROTIME-INR
INR: 0.99 (ref 0.00–1.49)
Prothrombin Time: 13.3 seconds (ref 11.6–15.2)

## 2015-09-05 LAB — MRSA PCR SCREENING: MRSA BY PCR: NEGATIVE

## 2015-09-05 MED ORDER — ATORVASTATIN CALCIUM 80 MG PO TABS
80.0000 mg | ORAL_TABLET | Freq: Every day | ORAL | Status: DC
  Administered 2015-09-06: 80 mg via ORAL
  Filled 2015-09-05: qty 1

## 2015-09-05 MED ORDER — SODIUM CHLORIDE 0.9 % IV SOLN
INTRAVENOUS | Status: DC
  Administered 2015-09-05: 17:00:00 via INTRAVENOUS

## 2015-09-05 MED ORDER — ASPIRIN 81 MG PO CHEW: 81.0000 mg | CHEWABLE_TABLET | ORAL | Status: AC

## 2015-09-05 MED ORDER — ONDANSETRON HCL 4 MG/2ML IJ SOLN: 4.0000 mg | Freq: Four times a day (QID) | INTRAMUSCULAR | Status: DC | PRN

## 2015-09-05 MED ORDER — NITROGLYCERIN 0.4 MG SL SUBL
0.4000 mg | SUBLINGUAL_TABLET | SUBLINGUAL | Status: AC
Start: 2015-09-05 — End: 2015-09-05
  Administered 2015-09-05: 0.4 mg via SUBLINGUAL
  Filled 2015-09-05: qty 1

## 2015-09-05 MED ORDER — MORPHINE SULFATE (PF) 4 MG/ML IV SOLN
4.0000 mg | Freq: Once | INTRAVENOUS | Status: AC
  Administered 2015-09-05: 4 mg via INTRAVENOUS
  Filled 2015-09-05: qty 1

## 2015-09-05 MED ORDER — ASPIRIN EC 81 MG PO TBEC
81.0000 mg | DELAYED_RELEASE_TABLET | Freq: Every day | ORAL | Status: DC
  Administered 2015-09-06 – 2015-09-07 (×2): 81 mg via ORAL
  Filled 2015-09-05 (×2): qty 1

## 2015-09-05 MED ORDER — NICOTINE 14 MG/24HR TD PT24
14.0000 mg | MEDICATED_PATCH | Freq: Every day | TRANSDERMAL | Status: DC
  Administered 2015-09-05 – 2015-09-07 (×3): 14 mg via TRANSDERMAL
  Filled 2015-09-05 (×3): qty 1

## 2015-09-05 MED ORDER — HEPARIN (PORCINE) IN NACL 100-0.45 UNIT/ML-% IJ SOLN
1650.0000 [IU]/h | INTRAMUSCULAR | Status: DC
  Administered 2015-09-05: 1250 [IU]/h via INTRAVENOUS
  Administered 2015-09-06: 1450 [IU]/h via INTRAVENOUS
  Filled 2015-09-05 (×5): qty 250

## 2015-09-05 MED ORDER — HEPARIN BOLUS VIA INFUSION
4000.0000 [IU] | Freq: Once | INTRAVENOUS | Status: AC
  Administered 2015-09-05: 4000 [IU] via INTRAVENOUS
  Filled 2015-09-05: qty 4000

## 2015-09-05 MED ORDER — NITROGLYCERIN IN D5W 200-5 MCG/ML-% IV SOLN
5.0000 ug/min | INTRAVENOUS | Status: DC
  Administered 2015-09-05: 5 ug/min via INTRAVENOUS
  Filled 2015-09-05: qty 250

## 2015-09-05 MED ORDER — ONDANSETRON HCL 4 MG/2ML IJ SOLN
4.0000 mg | Freq: Once | INTRAMUSCULAR | Status: AC
  Administered 2015-09-05: 4 mg via INTRAVENOUS
  Filled 2015-09-05: qty 2

## 2015-09-05 MED ORDER — ACETAMINOPHEN 325 MG PO TABS
650.0000 mg | ORAL_TABLET | ORAL | Status: DC | PRN
  Administered 2015-09-05: 650 mg via ORAL
  Filled 2015-09-05: qty 2

## 2015-09-05 MED ORDER — PANTOPRAZOLE SODIUM 40 MG PO TBEC
40.0000 mg | DELAYED_RELEASE_TABLET | Freq: Every day | ORAL | Status: DC
  Administered 2015-09-06 – 2015-09-07 (×2): 40 mg via ORAL
  Filled 2015-09-05 (×2): qty 1

## 2015-09-05 MED ORDER — NITROGLYCERIN 0.4 MG SL SUBL: 0.4000 mg | SUBLINGUAL_TABLET | SUBLINGUAL | Status: DC | PRN

## 2015-09-05 NOTE — ED Notes (Signed)
cardiology at bedside. Called cath lab. Will call when ready.

## 2015-09-05 NOTE — Progress Notes (Signed)
ANTICOAGULATION CONSULT NOTE - Initial Consult  Pharmacy Consult for heparin Indication: chest pain/ACS  Allergies  Allergen Reactions  . Oxycodone Hcl Nausea And Vomiting    Patient Measurements: Height: 6\' 3"  (190.5 cm) Weight: 210 lb (95.255 kg) IBW/kg (Calculated) : 84.5 Heparin Dosing Weight: 95.3  Vital Signs: Temp: 97.9 F (36.6 C) (06/07 1250) Temp Source: Oral (06/07 1250) BP: 126/81 mmHg (06/07 1415) Pulse Rate: 80 (06/07 1415)  Labs:  Recent Labs  09/05/15 1250  HGB 15.1  HCT 46.8  PLT 203  CREATININE 1.46*    Estimated Creatinine Clearance: 78.8 mL/min (by C-G formula based on Cr of 1.46).   Medical History: Past Medical History  Diagnosis Date  . Ischemic cardiomyopathy     echocardiogram 12/11: Mild LVH, EF 30-35%, anteroseptal and apical akinesis, grade 1 diastolic dysfunction  . Hypercholesteremia   . LBP (low back pain)   . Coronary artery disease     a. 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  b. eventually required single vessel CABG (L-LAD) in 03/2009 due to restenosis. c. cath 2013 for hand numbness showed patent LIMA sequential to the diags with LAD filling retrograde, no new disease in RCA and Cx, low LVEDP, sx felt noncardiac.  Marland Kitchen Chronic systolic heart failure (HCC)     Ischemic CM (EF 30%) with NYHA Class III CHF  . Back pain   . Paroxysmal ventricular tachycardia (HCC)     s/p AICD  . Esophageal reflux   . Anxiety state, unspecified   . Tobacco abuse   . Heart attack ([place])   . OSA (obstructive sleep apnea) 12/30/2010    Assessment: 40 yo male with net onset chest pain. Pharmacy consulted to dose heparin for r/o ACS. CBC wnl. Not on anticoagulation PTA.  Goal of Therapy:  Heparin level 0.3-0.7 units/ml Monitor platelets by anticoagulation protocol: Yes   Plan:  Give 4000 units bolus x 1 Start heparin infusion at 1250 units/hr Check anti-Xa level in 6 hours and daily while on  heparin Continue to monitor H&H and platelets  Melburn Popper, PharmD Clinical Pharmacy Resident Pager: [phone] 09/05/2015 2:34 PM

## 2015-09-05 NOTE — ED Notes (Signed)
To ED from home with c/o chest pain that started last night-- took nexium, pain/burning did not go away-- "pain is not as bad when sitting still-- I though it was heartburn"

## 2015-09-05 NOTE — ED Notes (Signed)
Cath lab called. States MD cooper will come see the pt and then go to the cath lab from the ED.

## 2015-09-05 NOTE — Progress Notes (Signed)
ANTICOAGULATION CONSULT NOTE - f/u Consult  Pharmacy Consult for heparin Indication: chest pain/ACS  Allergies  Allergen Reactions  . Oxycodone Hcl Nausea And Vomiting    Patient Measurements: Height: 6\' 3"  (190.5 cm) Weight: 210 lb (95.255 kg) IBW/kg (Calculated) : 84.5 Heparin Dosing Weight: 95.3  Vital Signs: Temp: 97.4 F (36.3 C) (06/07 2000) Temp Source: Oral (06/07 2000) BP: 110/64 mmHg (06/07 2030) Pulse Rate: 78 (06/07 2045)  Labs:  Recent Labs  09/05/15 1250 09/05/15 1554 09/05/15 1935  HGB 15.1  --   --   HCT 46.8  --   --   PLT 203  --   --   LABPROT  --  13.3  --   INR  --  0.99  --   HEPARINUNFRC  --   --  0.21*  CREATININE 1.46*  --   --   TROPONINI  --  10.04*  --     Estimated Creatinine Clearance: 78.8 mL/min (by C-G formula based on Cr of 1.46).   Medical History: Past Medical History  Diagnosis Date  . Ischemic cardiomyopathy     echocardiogram 12/11: Mild LVH, EF 30-35%, anteroseptal and apical akinesis, grade 1 diastolic dysfunction  . Hypercholesteremia   . LBP (low back pain)   . Coronary artery disease     a. 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  b. eventually required single vessel CABG (L-LAD) in 03/2009 due to restenosis. c. cath 2013 for hand numbness showed patent LIMA sequential to the diags with LAD filling retrograde, no new disease in RCA and Cx, low LVEDP, sx felt noncardiac.  Marland Kitchen Chronic systolic heart failure (HCC)     Ischemic CM (EF 30%) with NYHA Class III CHF  . Back pain   . Paroxysmal ventricular tachycardia (HCC)     s/p AICD  . Esophageal reflux   . Anxiety state, unspecified   . Tobacco abuse   . Heart attack ([place])   . OSA (obstructive sleep apnea) 12/30/2010  . 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 [place] [employer] ICD 12/2008.     Assessment: 40 yo male with net onset chest pain. Pharmacy consulted to dose heparin for r/o ACS. CBC wnl. Not  on anticoagulation PTA.  Initial HL 0.21 low  Goal of Therapy:  Heparin level 0.3-0.7 units/ml Monitor platelets by anticoagulation protocol: Yes   Plan:  Increase IV heparin to 1450 units/hr Check HL and CBC in AM.   Tevon Berhane S. Alford Highland, PharmD, [facility] Clinical Staff Pharmacist Pager [phone]   09/05/2015 9:02 PM

## 2015-09-05 NOTE — ED Provider Notes (Signed)
CSN: [id number]     Arrival date & time 09/05/15  1239 History   First MD Initiated Contact with Patient 09/05/15 1318     Chief Complaint  Patient presents with  . Chest Pain     (Consider location/radiation/quality/duration/timing/severity/associated sxs/prior Treatment) HPI   40 year old male with history of ischemic cardiomyopathy, CAD, prior MI status post stenting, chronic systolic heart failure, paroxysmal ventricular tachycardia status post AICD, GERD, OSA presenting with complaint of chest pain. Patient report nonexertional chest pain which started yesterday afternoon and has been persistent since. Describe his pain as a "brick sitting on my chest", 8 out of 10, nonradiating, persistent, with associated shortness of breath, nausea and dizziness. Pain felt similar to prior heart attack. Initially he thought it was heartburn but it did not seems to improve. He was unable to find his nitroglycerin that was prescribed previously. He did take 2 325 mg aspirin this a.m. he denies any street drug use specifically no cocaine use. Last heart attack was 4-5 years ago. He is a smoker, denies alcohol abuse.  Past Medical History  Diagnosis Date  . Ischemic cardiomyopathy     echocardiogram 12/11: Mild LVH, EF 30-35%, anteroseptal and apical akinesis, grade 1 diastolic dysfunction  . Hypercholesteremia   . LBP (low back pain)   . Coronary artery disease     CAD s/p AMI 10/09 and 6/10;  s/p BMS to LAD 12/2007 with subsequent POBA to LAD stent in 08/2008.  He eventually required single vessel CABG (L-LAD) in 03/2009 due to restenosis;  Myoview 12/11: No ischemia, large anterior apical scar consistent with prior MI, EF 22%.  . Chronic systolic heart failure (HCC)     Ischemic CM (EF 30%) with NYHA Class III CHF  . Back pain   . Paroxysmal ventricular tachycardia (HCC)     s/p AICD  . Esophageal reflux   . Anxiety state, unspecified   . Tobacco abuse   . Heart attack ([place])   . OSA (obstructive  sleep apnea) 12/30/2010   Past Surgical History  Procedure Laterality Date  . Cardiac defibrillator placement    . Coronary artery bypass graft      1/11  . Coronary angioplasty with stent placement      LAD stenting 10/09 and re-opening 6/10  . Cardiac defibrillator placement      2010 by JA  . Left heart catheterization with coronary angiogram N/A 08/07/2011    Procedure: LEFT HEART CATHETERIZATION WITH CORONARY ANGIOGRAM;  Surgeon: Josue Hector, MD;  Location: [facility] CATH LAB;  Service: Cardiovascular;  Laterality: N/A;   Family History  Problem Relation Age of Onset  . Heart attack Father 33    died at 74 after multiple MI's  . COPD Maternal Grandmother    Social History  Substance Use Topics  . Smoking status: Current Every Day Smoker -- 1.00 packs/day for 17 years    Types: Cigarettes  . Smokeless tobacco: None  . Alcohol Use: No    Review of Systems  All other systems reviewed and are negative.     Allergies  Oxycodone hcl  Home Medications   Prior to Admission medications   Medication Sig Start Date End Date Taking? Authorizing Provider  ALPRAZolam Duanne Moron) 1 MG tablet Take 1 tablet (1 mg total) by mouth 2 (two) times daily as needed for anxiety. 08/22/10   Hadassah Pais, MD  aspirin 325 MG tablet Take 325 mg by mouth daily.      Historical Provider, MD  carvedilol (COREG) 6.25 MG tablet Take 6.25 mg by mouth 2 (two) times daily with a meal.      Historical Provider, MD  furosemide (LASIX) 40 MG tablet Take 1 tablet (40 mg total) by mouth daily. 08/07/11   Josue Hector, MD  isosorbide mononitrate (IMDUR) 60 MG 24 hr tablet Take 1 tablet (60 mg total) by mouth daily. 08/05/11 08/04/12  Liliane Shi, PA-C  lisinopril (PRINIVIL,ZESTRIL) 10 MG tablet Take 10 mg by mouth daily.      Historical Provider, MD  nitroGLYCERIN (NITROSTAT) 0.4 MG SL tablet Place 1 tablet (0.4 mg total) under the tongue every 5 (five) minutes as needed. 08/05/11   Liliane Shi, PA-C  pantoprazole  (PROTONIX) 40 MG tablet Take 1 tablet (40 mg total) by mouth daily. 08/27/11 08/26/12  Thompson Grayer, MD  rosuvastatin (CRESTOR) 40 MG tablet Take 1 tablet (40 mg total) by mouth daily. 02/07/11 02/07/12  Scott T Weaver, PA-C   BP 126/85 mmHg  Pulse 75  Temp(Src) 97.9 F (36.6 C) (Oral)  Resp 18  Ht 6\' 3"  (1.905 m)  Wt 95.255 kg  BMI 26.25 kg/m2  SpO2 99% Physical Exam  Constitutional: He is oriented to person, place, and time. He appears well-developed and well-nourished. No distress.  HENT:  Head: Atraumatic.  Poor dentition  Eyes: Conjunctivae are normal.  Neck: Neck supple.  Cardiovascular: Normal rate and regular rhythm.   Pulmonary/Chest: Effort normal and breath sounds normal. He exhibits no tenderness.  Abdominal: Soft. There is no tenderness.  Musculoskeletal: He exhibits no edema.  Neurological: He is alert and oriented to person, place, and time.  Skin: No rash noted.  Psychiatric: He has a normal mood and affect.  Nursing note and vitals reviewed.   ED Course  Procedures (including critical care time) Labs Review Labs Reviewed  BASIC METABOLIC PANEL - Abnormal; Notable for the following:    Creatinine, Ser 1.46 (*)    GFR calc non Af Amer 58 (*)    All other components within normal limits  CBC - Abnormal; Notable for the following:    WBC 14.7 (*)    All other components within normal limits  I-STAT TROPOININ, ED - Abnormal; Notable for the following:    Troponin i, poc 10.96 (*)    All other components within normal limits    Imaging Review Dg Chest Portable 1 View  09/05/2015  CLINICAL DATA:  Chest pain for 1 day, smoker, shortness of breath, coronary artery disease post CABG, ischemic cardiomyopathy EXAM: PORTABLE CHEST 1 VIEW COMPARISON:  Portable exam 1413 hours compared to 08/05/2011 FINDINGS: LEFT subclavian transvenous pacemaker leads project over RIGHT atrium and RIGHT ventricle, unchanged. Normal heart size post CABG. Pulmonary vascular congestion.  Lungs clear. No pleural effusion or pneumothorax. Bones unremarkable. IMPRESSION: Post CABG and AICD. No acute abnormalities. Electronically Signed   By: Lavonia Dana M.D.   On: 09/05/2015 14:25   I have personally reviewed and evaluated these images and lab results as part of my medical decision-making.   EKG Interpretation   Date/Time:  Wednesday September 05 2015 12:43:36 EDT Ventricular Rate:  80 PR Interval:  158 QRS Duration: 128 QT Interval:  388 QTC Calculation: 447 R Axis:   -41 Text Interpretation:  Sinus rhythm with sinus arrhythmia with occasional  Premature ventricular complexes Possible Left atrial enlargement Left axis  deviation Non-specific intra-ventricular conduction block Cannot rule out  Septal infarct , age undetermined Abnormal ECG Confirmed by DELO  MD,  DOUGLAS ([id number]) on 09/05/2015 1:46:29 PM      MDM   Final diagnoses:  NSTEMI (non-ST elevated myocardial infarction) ([place])    BP 126/81 mmHg  Pulse 80  Temp(Src) 97.9 F (36.6 C) (Oral)  Resp 23  Ht 6\' 3"  (1.905 m)  Wt 95.255 kg  BMI 26.25 kg/m2  SpO2 98%   2:00 PM Patient with significant cardiac hx presents with chest pain concerning for cardiac etiology. He had an elevated troponin of 10.96. EKG shows nonspecific ST changes but no acute ischemic changes. Patient's sxs suggestsive of STEMI. He has had his aspirin this morning. Additional treatment here including morphine, nitro drip, and heparin drip were initiated. Prompt consultation to cardiology was made. I spoke with card-master Wannetta Sender, who will notify cardiologist to see and admit pt for further management.  Care discussed with Dr. Stark Jock.     CRITICAL CARE Performed by: Domenic Moras Total critical care time: 35 minutes Critical care time was exclusive of separately billable procedures and treating other patients. Critical care was necessary to treat or prevent imminent or life-threatening deterioration. Critical care was time spent personally by me  on the following activities: development of treatment plan with patient and/or surrogate as well as nursing, discussions with consultants, evaluation of patient's response to treatment, examination of patient, obtaining history from patient or surrogate, ordering and performing treatments and interventions, ordering and review of laboratory studies, ordering and review of radiographic studies, pulse oximetry and re-evaluation of patient's condition.   Domenic Moras, PA-C 09/05/15 [place], MD 09/06/15 [phone]

## 2015-09-05 NOTE — ED Notes (Signed)
Per Lab, patient has critical value of Troponin 10.96.  Called Darci Current, Charge RN to get a room.

## 2015-09-05 NOTE — Progress Notes (Signed)
Despite the SL NTG and IV NTG titration, patient has remained with 4/10 CP. BP 99/60 so unable to further titrate meds. Team coordinator made aware- patient will go for cath shortly. Cath lab made aware to avoid LV gram given his renal insufficiency (known poor EF). Dayna Dunn PA-C

## 2015-09-05 NOTE — H&P (Signed)
Cardiology History & Physical    Patient ID: John Davenport MRN: [id number], DOB: [birth year] Date of Encounter: 09/05/2015, 3:00 PM Primary Physician: No primary care provider on file. Primary Cardiologist: Dr. Rayann Davenport (back when [facility] was following general cardiology as well as his EP issues)  Chief Complaint: chest pain Reason for Admission: NSTEMI Requesting MD: John Moras PA-C  HPI: 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 Last cath 2013 for hand numbness showed patent LIMA sequential to the diags with LAD filling retrograde, no new disease in RCA and Cx, low LVEDP, sx felt noncardiac. He has not been seen in the office since 2013 he says because he was tired of getting bad news. He has not had his defibrillator followed in several years. He denies any interim ICD shocks. He is only taking aspirin and rare PRN Lasix (old, leftover from prior rx).   He was in his USOH until yesterday began developing sensation of heartburn similar to his first MI. He thought it was reflux at first but it just continued to get worse. He said he was nearly crying overnight. He dozed off overnight and pain initially improved but it got worse with a vengeance whenever he got up and walked around. He did not try any meds at home. It was 8-9/10 at its worst, associated with SOB and dizziness. In the ER he received 4mg  morphine, heparin gtt, Zofran, and NTG gtt at 45mcg/min with improvement down to 4/10. VSS. Reports continued tobacco abuse, denies EtOH or drugs, h/o remote substance abuse per chart. Otherwise labs notable for WBC 14k and Cr 1.46 (baseline in 2013 was 1.3). CXR NAD. He  took 2 325mg  ASA this AM.   Past Medical History  Diagnosis Date  . Ischemic cardiomyopathy     echocardiogram 12/11: Mild LVH, EF 30-35%, anteroseptal and apical akinesis, grade 1 diastolic dysfunction  . Hypercholesteremia   . LBP (low back pain)   . Coronary artery disease     a. 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  b. eventually required single vessel CABG (L-LAD) in 03/2009 due to restenosis. c. cath 2013 for hand numbness showed patent LIMA sequential to the diags with LAD filling retrograde, no new disease in RCA and Cx, low LVEDP, sx felt noncardiac.  Marland Kitchen Chronic systolic heart failure (HCC)     Ischemic CM (EF 30%) with NYHA Class III CHF  . Back pain   . Paroxysmal ventricular tachycardia (HCC)     s/p AICD  . Esophageal reflux   . Anxiety state, unspecified   . Tobacco abuse   . Heart attack ([place])   . OSA (obstructive sleep apnea) 12/30/2010  . ICD (implantable cardioverter-defibrillator) infection ([place])     a. [employer] Sci ICD 12/2008.      Surgical History:  Past Surgical History  Procedure Laterality Date  . Cardiac defibrillator placement    . Coronary artery bypass graft      1/11  . Coronary angioplasty with stent placement      LAD stenting 10/09 and re-opening 6/10  . Cardiac defibrillator  placement      2010 by John Davenport  . Left heart catheterization with coronary angiogram N/A 08/07/2011    Procedure: LEFT HEART CATHETERIZATION WITH CORONARY ANGIOGRAM;  Surgeon: John Hector, MD;  Location: [facility] CATH LAB;  Service: Cardiovascular;  Laterality: N/A;     Home Meds: Prior to Admission medications   Medication Sig Start Date End Date Taking? Authorizing Provider  aspirin 325 MG tablet Take 325 mg by mouth daily.     Yes Historical Provider, MD  carvedilol (COREG) 6.25 MG tablet Take 6.25 mg by mouth 2 (two) times daily with a meal.     Yes Historical Provider, MD  furosemide (LASIX) 40 MG tablet Take 1 tablet (40 mg total) by  mouth daily. 08/07/11  Yes John Hector, MD  lisinopril (PRINIVIL,ZESTRIL) 10 MG tablet Take 10 mg by mouth daily.     Yes Historical Provider, MD  isosorbide mononitrate (IMDUR) 60 MG 24 hr tablet Take 1 tablet (60 mg total) by mouth daily. 08/05/11 08/04/12  Liliane Shi, PA-C  nitroGLYCERIN (NITROSTAT) 0.4 MG SL tablet Place 1 tablet (0.4 mg total) under the tongue every 5 (five) minutes as needed. 08/05/11   Liliane Shi, PA-C  pantoprazole (PROTONIX) 40 MG tablet Take 1 tablet (40 mg total) by mouth daily. 08/27/11 08/26/12  Thompson Grayer, MD    Allergies:  Allergies  Allergen Reactions  . Oxycodone Hcl Nausea And Vomiting    Social History   Social History  . Marital Status: Single    Spouse Name: N/A  . Number of Children: 1  . Years of Education: N/A   Occupational History  . disabled    Social History Main Topics  . Smoking status: Current Every Day Smoker -- 1.00 packs/day for 17 years    Types: Cigarettes  . Smokeless tobacco: Not on file  . Alcohol Use: No  . Drug Use: No     Comment: REMOTE H/O SUBSTANCE ABUSE  . Sexual Activity: Not on file   Other Topics Concern  . Not on file   Social History Narrative   Pt lives with his mom, brother, and 70 yo son in [place].   Quit tobacco in Jan. 2011 prior to heart surgery (prior -1/2 PPD (down from 1 ppd per day, onset age 52)    alcohol abuse- prior, hasn't drank since age 42   no illicit drug use    Currently unemployed, awaiting disability   Single           Family History  Problem Relation Age of Onset  . Heart attack Father 25    died at 99 after multiple MI's  . COPD Maternal Grandmother     Review of Systems: no bleeding, syncope, interim stroke sx. All other systems reviewed and are otherwise negative except as noted above.  Labs:   Lab Results  Component Value Date   WBC 14.7* 09/05/2015   HGB 15.1 09/05/2015   HCT 46.8 09/05/2015   MCV 90.0 09/05/2015   PLT 203 09/05/2015    Recent  Labs Lab 09/05/15 1250  NA 141  K 4.1  CL 106  CO2 28  BUN 13  CREATININE 1.46*  CALCIUM 9.6  GLUCOSE 68    Lab Results  Component Value Date   CHOL 172 02/05/2011   HDL 34.70* 02/05/2011   LDLCALC 112* 02/05/2011   TRIG 125.0 02/05/2011    Radiology/Studies:  Dg Chest Portable 1 View  09/05/2015  CLINICAL DATA:  Chest pain for 1 day, smoker, shortness of breath, coronary artery disease post CABG, ischemic cardiomyopathy EXAM: PORTABLE CHEST 1 VIEW COMPARISON:  Portable exam 1413 hours compared to 08/05/2011 FINDINGS: LEFT subclavian transvenous pacemaker leads project over RIGHT atrium and RIGHT ventricle, unchanged. Normal heart size post CABG. Pulmonary vascular congestion. Lungs clear. No pleural effusion or pneumothorax. Bones unremarkable. IMPRESSION: Post CABG and AICD. No acute abnormalities. Electronically Signed   By: Lavonia Dana M.D.   On: 09/05/2015 14:25   Wt Readings from Last 3 Encounters:  09/05/15 210 lb (95.255 kg)  08/07/11 260 lb (117.935 kg)  08/05/11 255 lb 6.4 oz (115.849 kg)    EKG: NSR 80bpm, LAD, NSIVCD, nonspecific T wave innversion V5-V6, occ PVC  Physical Exam: Blood pressure 126/81, pulse 80, temperature 97.9 F (36.6 C), temperature source Oral, resp. rate 23, height 6\' 3"  (1.905 m), weight 210 lb (95.255 kg), SpO2 98 %. Body mass index is 26.25 kg/(m^2). General: Well developed, well nourished WM in no acute distress. Head: Normocephalic, atraumatic, sclera non-icteric, no xanthomas, nares are without discharge.  Neck: Negative for carotid bruits. JVD not elevated. Lungs: Clear bilaterally to auscultation without wheezes, rales, or rhonchi. Breathing is unlabored. Heart: RRR with S1 S2. No murmurs, rubs, or gallops appreciated. Abdomen: Soft, non-tender, non-distended with normoactive bowel sounds. No hepatomegaly. No rebound/guarding. No obvious abdominal masses. Msk:  Strength and tone appear normal for age. Extremities: No clubbing or  cyanosis. No edema.  Distal pedal pulses are 2+ and equal bilaterally. Neuro: Alert and oriented X 3. No focal deficit. No facial asymmetry. Moves all extremities spontaneously. Psych:  Responds to questions appropriately with a normal affect.    Assessment and Plan   1. NSTEMI with h/o CAD s/p prior PCI, CABGx1 as above - admit to CCU and cycle troponins. Continue IV heparin. We gave him a SL NTG without significant relief. Per d/w Dr. Irish Lack will titrate IV NTG in attempts to get pain free. Needs LHC - if unable to get pain free, will need to expedite this sooner. Risks and benefits of cardiac catheterization have been discussed with the patient. These include bleeding, infection, kidney damage, stroke, heart attack, death. The patient understands these risks and is willing to proceed. Suspect leukocytosis related to MI - no infective sx. Update 2D echo. Continue ASA. Add statin. BP is 1teens-120's before titration of NTG. If BP remains stable with further titration, will likely add carvedilol.  2. ICM/chronic systolic CHF - appears euvolemic. Wannetta Sender is contacting [employer] to interrogate defibrillator. Hold off on initiating ACEI given renal insufficiency and need for cath.   3. Hyperlipidemia - add on baseline LFTs. Check lipids in AM. Add high dose statin.  4. Tobacco abuse - counseled on importance of cessation. Remote hx of substance abuse listed in chart, will order UDS.  5. Mild renal insufficiency - may be patient's new baseline. Follow.  Signed, Nedra Hai Dunn PA-C 09/05/2015, 3:00 PM Pager: [phone]  I have examined the patient and reviewed assessment and plan and discussed with patient.  Agree with above as stated.  Increase NTG to try to get him pain free.  He will need cath.  Timing depends on his sx.  Stressed importance of tobacco cessation.   He will also need AICD interrogated.   Avoid ventriculogram at cath due to some mild renal insufficiency.    Larae Grooms

## 2015-09-06 ENCOUNTER — Inpatient Hospital Stay (HOSPITAL_COMMUNITY): Payer: Medicare Other

## 2015-09-06 ENCOUNTER — Encounter (HOSPITAL_COMMUNITY)
Admission: EM | Disposition: A | Discharge status: Home / Self Care | Payer: Self-pay | Source: Home / Self Care | Attending: Interventional Cardiology

## 2015-09-06 DIAGNOSIS — R079 Chest pain, unspecified: Secondary | ICD-10-CM

## 2015-09-06 HISTORY — PX: CARDIAC CATHETERIZATION: SHX172

## 2015-09-06 LAB — ECHOCARDIOGRAM COMPLETE
E decel time: 215 msec
EERAT: 8.28
FS: 7 % — AB (ref 28–44)
HEIGHTINCHES: 75 in
IVS/LV PW RATIO, ED: 0.62
LA ID, A-P, ES: 42 mm
LA diam end sys: 42 mm
LA diam index: 1.82 cm/m2
LA vol A4C: 84.4 ml
LA vol index: 38.3 mL/m2
LAVOL: 88.4 mL
LV E/e'average: 8.28
LV PW d: 9.48 mm — AB (ref 0.6–1.1)
LV e' LATERAL: 9.25 cm/s
LVEEMED: 8.28
LVOT area: 5.31 cm2
LVOT diameter: 26 mm
MV Dec: 215
MVPG: 2 mmHg
MVPKAVEL: 46.1 m/s
MVPKEVEL: 76.6 m/s
RV LATERAL S' VELOCITY: 11.7 cm/s
RV TAPSE: 15.1 mm
TDI e' lateral: 9.25
TDI e' medial: 4.79
WEIGHTICAEL: 3509.72 [oz_av]

## 2015-09-06 LAB — BASIC METABOLIC PANEL
Anion gap: 5 (ref 5–15)
BUN: 12 mg/dL (ref 6–20)
CALCIUM: 8.7 mg/dL — AB (ref 8.9–10.3)
CHLORIDE: 106 mmol/L (ref 101–111)
CO2: 29 mmol/L (ref 22–32)
CREATININE: 1.26 mg/dL — AB (ref 0.61–1.24)
Glucose, Bld: 99 mg/dL (ref 65–99)
Potassium: 3.8 mmol/L (ref 3.5–5.1)
SODIUM: 140 mmol/L (ref 135–145)

## 2015-09-06 LAB — HEPARIN LEVEL (UNFRACTIONATED)
HEPARIN UNFRACTIONATED: 0.3 [IU]/mL (ref 0.30–0.70)
HEPARIN UNFRACTIONATED: 0.26 [IU]/mL — AB (ref 0.30–0.70)

## 2015-09-06 LAB — LIPID PANEL
Cholesterol: 134 mg/dL (ref 0–200)
HDL: 34 mg/dL — AB (ref >40)
LDL CALC: 81 mg/dL (ref 0–99)
Total CHOL/HDL Ratio: 3.9 RATIO
Triglycerides: 96 mg/dL (ref <150)
VLDL: 19 mg/dL (ref 0–40)

## 2015-09-06 LAB — CBC
HEMATOCRIT: 42.4 % (ref 39.0–52.0)
Hemoglobin: 13.3 g/dL (ref 13.0–17.0)
MCH: 28.3 pg (ref 26.0–34.0)
MCHC: 31.4 g/dL (ref 30.0–36.0)
MCV: 90.2 fL (ref 78.0–100.0)
Platelets: 174 10*3/uL (ref 150–400)
RBC: 4.7 MIL/uL (ref 4.22–5.81)
RDW: 13.6 % (ref 11.5–15.5)
WBC: 10.8 10*3/uL — AB (ref 4.0–10.5)

## 2015-09-06 LAB — TROPONIN I: TROPONIN I: 5.87 ng/mL — AB (ref <0.031)

## 2015-09-06 SURGERY — CORONARY/GRAFT ANGIOGRAPHY
Anesthesia: LOCAL

## 2015-09-06 SURGERY — LEFT HEART CATH AND CORS/GRAFTS ANGIOGRAPHY

## 2015-09-06 MED ORDER — ISOSORBIDE MONONITRATE ER 60 MG PO TB24
60.0000 mg | ORAL_TABLET | Freq: Every day | ORAL | Status: DC
Start: 2015-09-07 — End: 2015-09-07
  Administered 2015-09-07: 60 mg via ORAL
  Filled 2015-09-06: qty 1

## 2015-09-06 MED ORDER — SODIUM CHLORIDE 0.9 % IV SOLN: 250.0000 mL | INTRAVENOUS | Status: DC | PRN

## 2015-09-06 MED ORDER — MIDAZOLAM HCL 2 MG/2ML IJ SOLN
INTRAMUSCULAR | Status: AC
  Filled 2015-09-06: qty 2

## 2015-09-06 MED ORDER — LIDOCAINE HCL (PF) 1 % IJ SOLN
INTRAMUSCULAR | Status: DC | PRN
  Administered 2015-09-06: 4 mL

## 2015-09-06 MED ORDER — HEPARIN (PORCINE) IN NACL 2-0.9 UNIT/ML-% IJ SOLN
INTRAMUSCULAR | Status: DC | PRN
Start: 2015-09-06 — End: 2015-09-06
  Administered 2015-09-06: 1500 mL

## 2015-09-06 MED ORDER — SODIUM CHLORIDE 0.9% FLUSH
3.0000 mL | Freq: Two times a day (BID) | INTRAVENOUS | Status: DC
  Administered 2015-09-07: 3 mL via INTRAVENOUS

## 2015-09-06 MED ORDER — VERAPAMIL HCL 2.5 MG/ML IV SOLN
INTRAVENOUS | Status: AC
  Filled 2015-09-06: qty 2

## 2015-09-06 MED ORDER — HEPARIN SODIUM (PORCINE) 1000 UNIT/ML IJ SOLN
INTRAMUSCULAR | Status: AC
  Filled 2015-09-06: qty 1

## 2015-09-06 MED ORDER — FENTANYL CITRATE (PF) 100 MCG/2ML IJ SOLN
INTRAMUSCULAR | Status: DC | PRN
  Administered 2015-09-06 (×2): 25 ug via INTRAVENOUS

## 2015-09-06 MED ORDER — IOPAMIDOL (ISOVUE-370) INJECTION 76%
INTRAVENOUS | Status: DC | PRN
  Administered 2015-09-06: 80 mL via INTRAVENOUS

## 2015-09-06 MED ORDER — SODIUM CHLORIDE 0.9% FLUSH: 3.0000 mL | Freq: Two times a day (BID) | INTRAVENOUS | Status: DC

## 2015-09-06 MED ORDER — SODIUM CHLORIDE 0.9% FLUSH: 3.0000 mL | INTRAVENOUS | Status: DC | PRN

## 2015-09-06 MED ORDER — VERAPAMIL HCL 2.5 MG/ML IV SOLN
INTRAVENOUS | Status: DC | PRN
  Administered 2015-09-06: 1.2 mL via INTRA_ARTERIAL

## 2015-09-06 MED ORDER — FUROSEMIDE 40 MG PO TABS
40.0000 mg | ORAL_TABLET | Freq: Every day | ORAL | Status: DC
  Administered 2015-09-07: 40 mg via ORAL
  Filled 2015-09-06: qty 1

## 2015-09-06 MED ORDER — FENTANYL CITRATE (PF) 100 MCG/2ML IJ SOLN
INTRAMUSCULAR | Status: AC
  Filled 2015-09-06: qty 2

## 2015-09-06 MED ORDER — IOPAMIDOL (ISOVUE-370) INJECTION 76%
INTRAVENOUS | Status: AC
  Filled 2015-09-06: qty 100

## 2015-09-06 MED ORDER — HEPARIN (PORCINE) IN NACL 2-0.9 UNIT/ML-% IJ SOLN
INTRAMUSCULAR | Status: AC
  Filled 2015-09-06: qty 1000

## 2015-09-06 MED ORDER — LIDOCAINE HCL (PF) 1 % IJ SOLN
INTRAMUSCULAR | Status: AC
  Filled 2015-09-06: qty 30

## 2015-09-06 MED ORDER — HEPARIN SODIUM (PORCINE) 1000 UNIT/ML IJ SOLN
INTRAMUSCULAR | Status: DC | PRN
  Administered 2015-09-06: 5000 [IU] via INTRAVENOUS

## 2015-09-06 MED ORDER — MIDAZOLAM HCL 2 MG/2ML IJ SOLN
INTRAMUSCULAR | Status: DC | PRN
  Administered 2015-09-06: 2 mg via INTRAVENOUS
  Administered 2015-09-06: 1 mg via INTRAVENOUS

## 2015-09-06 SURGICAL SUPPLY — 11 items
CATH INFINITI 5 FR AL2 (CATHETERS) ×3 IMPLANT
CATH INFINITI 5 FR AR1 MOD (CATHETERS) ×3 IMPLANT
CATH INFINITI 5 FR IM (CATHETERS) ×3 IMPLANT
CATH INFINITI 5FR MULTPACK ANG (CATHETERS) ×3 IMPLANT
DEVICE RAD COMP TR BAND LRG (VASCULAR PRODUCTS) ×3 IMPLANT
GLIDESHEATH SLEND SS 6F .021 (SHEATH) ×3 IMPLANT
KIT HEART LEFT (KITS) ×3 IMPLANT
PACK CARDIAC CATHETERIZATION (CUSTOM PROCEDURE TRAY) ×3 IMPLANT
TRANSDUCER W/STOPCOCK (MISCELLANEOUS) ×3 IMPLANT
TUBING CIL FLEX 10 FLL-RA (TUBING) ×3 IMPLANT
WIRE SAFE-T 1.5MM-J .035X260CM (WIRE) ×3 IMPLANT

## 2015-09-06 NOTE — Progress Notes (Signed)
Patient sleeping, no needs at this time. Call light within reach

## 2015-09-06 NOTE — H&P (View-Only) (Signed)
SUBJECTIVE:  Chest pain resolved.  OBJECTIVE:   Vitals:   Filed Vitals:   09/06/15 0530 09/06/15 0600 09/06/15 0630 09/06/15 0700  BP: 116/69 113/66 107/67 107/67  Pulse: 73 72 69 63  Temp:      TempSrc:      Resp: 21 18 16 20   Height:      Weight:      SpO2: 100% 100% 99% 100%   I&O's:   Intake/Output Summary (Last 24 hours) at 09/06/15 0944 Last data filed at 09/06/15 0800  Gross per 24 hour  Intake 1510.41 ml  Output    975 ml  Net 535.41 ml   TELEMETRY: Reviewed telemetry pt in Normal sinus rhythm:     PHYSICAL EXAM General: Well developed, well nourished, in no acute distress Head:   Normal cephalic and atramatic, poor dentition  Lungs:  Clear bilaterally to auscultation. Heart:   HRRR S1 S2  No JVD.   Abdomen: abdomen soft and non-tender Msk:  Back normal,  Normal strength and tone for age. Extremities:   No edema.   Neuro: Alert and oriented. Psych:  Normal affect, responds appropriately Skin: No rash   LABS: Basic Metabolic Panel:  Recent Labs  09/05/15 1250 09/05/15 1935 09/06/15 0420  NA 141  --  140  K 4.1  --  3.8  CL 106  --  106  CO2 28  --  29  GLUCOSE 68  --  99  BUN 13  --  12  CREATININE 1.46*  --  1.26*  CALCIUM 9.6  --  8.7*  MG  --  1.9  --    Liver Function Tests:  Recent Labs  09/05/15 1554  AST 53*  ALT 37  ALKPHOS 59  BILITOT 0.6  PROT 6.3*  ALBUMIN 3.1*   No results for input(s): LIPASE, AMYLASE in the last 72 hours. CBC:  Recent Labs  09/05/15 1250 09/06/15 0420  WBC 14.7* 10.8*  HGB 15.1 13.3  HCT 46.8 42.4  MCV 90.0 90.2  PLT 203 174   Cardiac Enzymes:  Recent Labs  09/05/15 1554 09/05/15 2140 09/06/15 0400  TROPONINI 10.04* 6.66* 5.87*   BNP: Invalid input(s): POCBNP D-Dimer: No results for input(s): DDIMER in the last 72 hours. Hemoglobin A1C: No results for input(s): HGBA1C in the last 72 hours. Fasting Lipid Panel:  Recent Labs  09/06/15 0420  CHOL 134  HDL 34*  LDLCALC 81    TRIG 96  CHOLHDL 3.9   Thyroid Function Tests:  Recent Labs  09/05/15 1935  TSH 1.973   Anemia Panel: No results for input(s): VITAMINB12, FOLATE, FERRITIN, TIBC, IRON, RETICCTPCT in the last 72 hours. Coag Panel:   Lab Results  Component Value Date   INR 0.99 09/05/2015   INR 1.0 08/05/2011   INR 0.95 11/20/2010    RADIOLOGY: Dg Chest Portable 1 View  09/05/2015  CLINICAL DATA:  Chest pain for 1 day, smoker, shortness of breath, coronary artery disease post CABG, ischemic cardiomyopathy EXAM: PORTABLE CHEST 1 VIEW COMPARISON:  Portable exam 1413 hours compared to 08/05/2011 FINDINGS: LEFT subclavian transvenous pacemaker leads project over RIGHT atrium and RIGHT ventricle, unchanged. Normal heart size post CABG. Pulmonary vascular congestion. Lungs clear. No pleural effusion or pneumothorax. Bones unremarkable. IMPRESSION: Post CABG and AICD. No acute abnormalities. Electronically Signed   By: Lavonia Dana M.D.   On: 09/05/2015 14:25      ASSESSMENT: NSTEMI in a patient who was positive for cocaine. Known coronary artery  disease years ago and ischemic cardiomyopathy.  PLAN:   1) plan cardiac cath later today. No contraindication to drug-eluting stent from a bleeding standpoint. I think he will be compliant since he understands that not taking his medicines could result in a big heart attack.  He needs to stop smoking. He needs to drink more water as he drinks only sweet tea. When I asked him about drinking more water, he states "I can't drink water."    His mother was in the room. She reports that his urine is typically orange at home. I again stressed the importance of staying well hydrated. I did not address his cocaine use because his mother was in the room. She did make a mention that the patient knows he needs to stop smoking and doing "all that other stuff." The patient's father died from heart disease as well.  All questions about the cath were explained to the patient and  he is willing to proceed.  Jettie Booze, MD  09/06/2015  9:44 AM

## 2015-09-06 NOTE — Progress Notes (Signed)
ANTICOAGULATION CONSULT NOTE - Follow Up Consult  Pharmacy Consult for heparin Indication: NSTEMI  Labs:  Recent Labs  09/05/15 1250 09/05/15 1554 09/05/15 1935 09/05/15 2140 09/06/15 0420  HGB 15.1  --   --   --   --   HCT 46.8  --   --   --   --   PLT 203  --   --   --   --   LABPROT  --  13.3  --   --   --   INR  --  0.99  --   --   --   HEPARINUNFRC  --   --  0.21*  --  0.30  CREATININE 1.46*  --   --   --   --   TROPONINI  --  10.04*  --  6.66*  --     Assessment: 40yo male therapeutic on heparin after rate increase though at very low end of goal.  Goal of Therapy:  Heparin level 0.3-0.7 units/ml   Plan:  Will increase heparin gtt slightly to 1500 units/hr and check level in 6hr.  Wynona Neat, PharmD, BCPS  09/06/2015,5:02 AM

## 2015-09-06 NOTE — Progress Notes (Signed)
[place] for heparin Indication: chest pain/ACS  Allergies  Allergen Reactions  . Oxycodone Hcl Nausea And Vomiting    Patient Measurements: Height: 6\' 3"  (190.5 cm) Weight: 219 lb 5.7 oz (99.5 kg) IBW/kg (Calculated) : 84.5 Heparin Dosing Weight: 95.3  Vital Signs: Temp: 98 F (36.7 C) (06/08 1156) Temp Source: Oral (06/08 1156) BP: 119/77 mmHg (06/08 1200) Pulse Rate: 92 (06/08 1200)  Labs:  Recent Labs  09/05/15 1250 09/05/15 1554 09/05/15 1935 09/05/15 2140 09/06/15 0400 09/06/15 0420 09/06/15 1105  HGB 15.1  --   --   --   --  13.3  --   HCT 46.8  --   --   --   --  42.4  --   PLT 203  --   --   --   --  174  --   LABPROT  --  13.3  --   --   --   --   --   INR  --  0.99  --   --   --   --   --   HEPARINUNFRC  --   --  0.21*  --   --  0.30 0.26*  CREATININE 1.46*  --   --   --   --  1.26*  --   TROPONINI  --  10.04*  --  6.66* 5.87*  --   --     Estimated Creatinine Clearance: 91.3 mL/min (by C-G formula based on Cr of 1.26).   Assessment: 40 yo male with net onset chest pain. Pharmacy consulted to dose heparin for r/o ACS. CBC wnl. Not on anticoagulation PTA.  Heparin levels have been low- most recently 0.26 units/mL.  Hgb 31.3, plts 174- no bleeding noted  Goal of Therapy:  Heparin level 0.3-0.7 units/ml Monitor platelets by anticoagulation protocol: Yes   Plan:  Increase IV heparin to 1650 units/hr F/u after cath Daily HL and CBC   Jozalyn Baglio D. Burdette Gergely, PharmD, BCPS Clinical Pharmacist Pager: [phone] 09/06/2015 12:39 PM

## 2015-09-06 NOTE — Progress Notes (Signed)
SUBJECTIVE:  Chest pain resolved.  OBJECTIVE:   Vitals:   Filed Vitals:   09/06/15 0530 09/06/15 0600 09/06/15 0630 09/06/15 0700  BP: 116/69 113/66 107/67 107/67  Pulse: 73 72 69 63  Temp:      TempSrc:      Resp: 21 18 16 20   Height:      Weight:      SpO2: 100% 100% 99% 100%   I&O's:   Intake/Output Summary (Last 24 hours) at 09/06/15 0944 Last data filed at 09/06/15 0800  Gross per 24 hour  Intake 1510.41 ml  Output    975 ml  Net 535.41 ml   TELEMETRY: Reviewed telemetry pt in Normal sinus rhythm:     PHYSICAL EXAM General: Well developed, well nourished, in no acute distress Head:   Normal cephalic and atramatic, poor dentition  Lungs:  Clear bilaterally to auscultation. Heart:   HRRR S1 S2  No JVD.   Abdomen: abdomen soft and non-tender Msk:  Back normal,  Normal strength and tone for age. Extremities:   No edema.   Neuro: Alert and oriented. Psych:  Normal affect, responds appropriately Skin: No rash   LABS: Basic Metabolic Panel:  Recent Labs  09/05/15 1250 09/05/15 1935 09/06/15 0420  NA 141  --  140  K 4.1  --  3.8  CL 106  --  106  CO2 28  --  29  GLUCOSE 68  --  99  BUN 13  --  12  CREATININE 1.46*  --  1.26*  CALCIUM 9.6  --  8.7*  MG  --  1.9  --    Liver Function Tests:  Recent Labs  09/05/15 1554  AST 53*  ALT 37  ALKPHOS 59  BILITOT 0.6  PROT 6.3*  ALBUMIN 3.1*   No results for input(s): LIPASE, AMYLASE in the last 72 hours. CBC:  Recent Labs  09/05/15 1250 09/06/15 0420  WBC 14.7* 10.8*  HGB 15.1 13.3  HCT 46.8 42.4  MCV 90.0 90.2  PLT 203 174   Cardiac Enzymes:  Recent Labs  09/05/15 1554 09/05/15 2140 09/06/15 0400  TROPONINI 10.04* 6.66* 5.87*   BNP: Invalid input(s): POCBNP D-Dimer: No results for input(s): DDIMER in the last 72 hours. Hemoglobin A1C: No results for input(s): HGBA1C in the last 72 hours. Fasting Lipid Panel:  Recent Labs  09/06/15 0420  CHOL 134  HDL 34*  LDLCALC 81    TRIG 96  CHOLHDL 3.9   Thyroid Function Tests:  Recent Labs  09/05/15 1935  TSH 1.973   Anemia Panel: No results for input(s): VITAMINB12, FOLATE, FERRITIN, TIBC, IRON, RETICCTPCT in the last 72 hours. Coag Panel:   Lab Results  Component Value Date   INR 0.99 09/05/2015   INR 1.0 08/05/2011   INR 0.95 11/20/2010    RADIOLOGY: Dg Chest Portable 1 View  09/05/2015  CLINICAL DATA:  Chest pain for 1 day, smoker, shortness of breath, coronary artery disease post CABG, ischemic cardiomyopathy EXAM: PORTABLE CHEST 1 VIEW COMPARISON:  Portable exam 1413 hours compared to 08/05/2011 FINDINGS: LEFT subclavian transvenous pacemaker leads project over RIGHT atrium and RIGHT ventricle, unchanged. Normal heart size post CABG. Pulmonary vascular congestion. Lungs clear. No pleural effusion or pneumothorax. Bones unremarkable. IMPRESSION: Post CABG and AICD. No acute abnormalities. Electronically Signed   By: Lavonia Dana M.D.   On: 09/05/2015 14:25      ASSESSMENT: NSTEMI in a patient who was positive for cocaine. Known coronary artery  disease years ago and ischemic cardiomyopathy.  PLAN:   1) plan cardiac cath later today. No contraindication to drug-eluting stent from a bleeding standpoint. I think he will be compliant since he understands that not taking his medicines could result in a big heart attack.  He needs to stop smoking. He needs to drink more water as he drinks only sweet tea. When I asked him about drinking more water, he states "I can't drink water."    His mother was in the room. She reports that his urine is typically orange at home. I again stressed the importance of staying well hydrated. I did not address his cocaine use because his mother was in the room. She did make a mention that the patient knows he needs to stop smoking and doing "all that other stuff." The patient's father died from heart disease as well.  All questions about the cath were explained to the patient and  he is willing to proceed.  Jettie Booze, MD  09/06/2015  9:44 AM

## 2015-09-06 NOTE — Progress Notes (Signed)
TR BAND REMOVAL  LOCATION:    Left radial  DEFLATED PER PROTOCOL:    Yes.    TIME BAND OFF / DRESSING APPLIED:    1630p   SITE UPON ARRIVAL:    Level 0  SITE AFTER BAND REMOVAL:    Level 0  CIRCULATION SENSATION AND MOVEMENT:    Within Normal Limits   Yes.    COMMENTS:   Radial pulse a 2 +, No complaints of discomfort at site.  Pt able to move fingers freely .

## 2015-09-06 NOTE — Progress Notes (Signed)
  Echocardiogram 2D Echocardiogram has been performed.  John Davenport 09/06/2015, 9:55 AM

## 2015-09-06 NOTE — Interval H&P Note (Signed)
Cath Lab Visit (complete for each Cath Lab visit)  Clinical Evaluation Leading to the Procedure:   ACS: Yes.    Non-ACS:    Anginal Classification: CCS IV  Anti-ischemic medical therapy: No Therapy  Non-Invasive Test Results: No non-invasive testing performed  Prior CABG: No previous CABG      History and Physical Interval Note:  09/06/2015 1:16 PM  John Davenport  has presented today for surgery, with the diagnosis of cad  The various methods of treatment have been discussed with the patient and family. After consideration of risks, benefits and other options for treatment, the patient has consented to  Procedure(s): Left Heart Cath and Cors/Grafts Angiography (N/A) as a surgical intervention .  The patient's history has been reviewed, patient examined, no change in status, stable for surgery.  I have reviewed the patient's chart and labs.  Questions were answered to the patient's satisfaction.     Sherren Mocha

## 2015-09-07 ENCOUNTER — Encounter (HOSPITAL_COMMUNITY): Payer: Self-pay | Admitting: Cardiovascular Disease

## 2015-09-07 MED ORDER — SODIUM CHLORIDE 0.9% FLUSH: 3.0000 mL | INTRAVENOUS | Status: DC | PRN

## 2015-09-07 MED ORDER — ATORVASTATIN CALCIUM 80 MG PO TABS: 80.0000 mg | ORAL_TABLET | Freq: Every day | ORAL | Status: DC

## 2015-09-07 MED ORDER — SODIUM CHLORIDE 0.9 % WEIGHT BASED INFUSION: 3.0000 mL/kg/h | INTRAVENOUS | Status: DC

## 2015-09-07 MED ORDER — LISINOPRIL 10 MG PO TABS
10.0000 mg | ORAL_TABLET | Freq: Every day | ORAL | Status: DC
  Administered 2015-09-07: 10 mg via ORAL
  Filled 2015-09-07: qty 1

## 2015-09-07 MED ORDER — SODIUM CHLORIDE 0.9 % WEIGHT BASED INFUSION: 1.0000 mL/kg/h | INTRAVENOUS | Status: DC

## 2015-09-07 MED ORDER — SODIUM CHLORIDE 0.9 % IV SOLN: 250.0000 mL | INTRAVENOUS | Status: DC | PRN

## 2015-09-07 MED ORDER — SODIUM CHLORIDE 0.9% FLUSH
3.0000 mL | Freq: Two times a day (BID) | INTRAVENOUS | Status: DC
  Administered 2015-09-07: 3 mL via INTRAVENOUS

## 2015-09-07 MED ORDER — ASPIRIN 81 MG PO TBEC: 81.0000 mg | DELAYED_RELEASE_TABLET | Freq: Every day | ORAL | Status: DC

## 2015-09-07 MED ORDER — ASPIRIN 81 MG PO CHEW: 81.0000 mg | CHEWABLE_TABLET | ORAL | Status: DC

## 2015-09-07 NOTE — Discharge Summary (Signed)
Discharge Summary    Patient ID: John Davenport,  MRN: [id number], DOB/AGE: 1975/07/17 40 y.o.  Admit date: 09/05/2015 Discharge date: 09/07/2015  Primary Care Provider: No primary care provider on file. Primary Cardiologist: Dr. Rayann Heman  Discharge Diagnoses    Principal Problem:   NSTEMI (non-ST elevated myocardial infarction) [facility]) Active Problems:   CAD in native artery   Chronic systolic heart failure ([place])   GERD   Ischemic cardiomyopathy   Tobacco abuse   Hyperlipidemia   Renal insufficiency  History of Present Illness     John Davenport is a 40 y/o male with past medical history of CAD (AMI s/p BMS to LAD 12/2007, MI 08/2008 in [place] with subsequent POBA to LAD stent for ISR in 08/2008, single vessel CABG in 03/2009 due to restenosis), ICM EF 30-35% (s/p [employer] Sci ICD [id number]), chronic systolic CHF, mild sleep apnea, HLD, paroxysmal VT, anxiety, GERD, tobacco abuse who presented to [facility] on 09/05/2015 for evaluation of chest pain. with complaints of chest discomfort.   He was in his USOH until he began developing a sensation of heartburn similar to his first MI the day prior to his presentation. He thought it was reflux at first but it just continued to get worse. He said he was nearly crying overnight. He dozed off overnight and pain initially improved but it got worse with a vengeance whenever he got up and walked around. He did not try any meds at home. It was 8-9/10 at its worst, associated with SOB and dizziness. In the ER he received 4mg  morphine, heparin gtt, Zofran, and NTG gtt at 75mcg/min with improvement down to 4/10. VSS. Reports continued tobacco abuse, denies EtOH or drugs, John/o remote substance abuse per chart. Otherwise labs notable for WBC 14k and Cr 1.46 (baseline in 2013 was 1.3). CXR NAD. He was admitted for further observation.  Hospital Course     Consultants: None  The following morning, he reported his symptoms had resolved overnight. Cyclic  troponin values were elevated to 10.04, 6.66, and 5.87. UDS was positive for Cocaine and Opiates. He denied Cocaine use but reported using THC, however this did not show up in his UDS. He was thoroughly educated on the importance of cessation of Cocaine use in the setting of his known heart disease and AICD. His BB was discontinued.  His cardiac catheterization was performed later that day and showed a patent LIMA sequential -D1 and D2 with 100% stenosis of the Ost to Prox LAD with previously placed stent andsubtotal occlusion of the distal circumflex into a tiny OM, possible culprit for his NSTEMI. Granted, with his known Cocaine use this is also a likely etiology of his NSTEMI. He tolerated the procedure well and no complications were noted. A repeat echocardiogram was performed and showed an EF of 20% with severe diffuse hypokinesis with distinct regional wall motion abnormalities and akinesis of the entire inferoseptal and apical myocardium. Grade 2 DD was noted as well.  On 09/07/2015, he denied any repeat symptoms overnight. Labs and vitals were thoroughly reviewed and appeared stable. He was last examined by Dr. Irish Lack and deemed stable for discharge.  A 2-week Cardiology follow-up has been arranged. He has not been seen by Cardiology in 3+ years due to "not wanting to come to appointments due to getting bad news". The importance of follow-up was reviewed with the patient. Following his hospital follow-up appointment, he will need an appointment with Dr. Rayann Heman in regards to his  AICD. _____________  Discharge Vitals Blood pressure 121/76, pulse 74, temperature 97.6 F (36.4 C), temperature source Oral, resp. rate 18, height 6\' 3"  (1.905 m), weight 215 lb 4.8 oz (97.659 kg), SpO2 100 %.  Filed Weights   09/05/15 1250 09/06/15 0426 09/07/15 0450  Weight: 210 lb (95.255 kg) 219 lb 5.7 oz (99.5 kg) 215 lb 4.8 oz (97.659 kg)    Labs & Radiologic Studies     CBC  Recent Labs  09/05/15 1250  09/06/15 0420  WBC 14.7* 10.8*  HGB 15.1 13.3  HCT 46.8 42.4  MCV 90.0 90.2  PLT 203 [id number]   Basic Metabolic Panel  Recent Labs  09/05/15 1250 09/05/15 1935 09/06/15 0420  NA 141  --  140  K 4.1  --  3.8  CL 106  --  106  CO2 28  --  29  GLUCOSE 68  --  99  BUN 13  --  12  CREATININE 1.46*  --  1.26*  CALCIUM 9.6  --  8.7*  MG  --  1.9  --    Liver Function Tests  Recent Labs  09/05/15 1554  AST 53*  ALT 37  ALKPHOS 59  BILITOT 0.6  PROT 6.3*  ALBUMIN 3.1*   No results for input(s): LIPASE, AMYLASE in the last 72 hours. Cardiac Enzymes  Recent Labs  09/05/15 1554 09/05/15 2140 09/06/15 0400  TROPONINI 10.04* 6.66* 5.87*   Fasting Lipid Panel  Recent Labs  09/06/15 0420  CHOL 134  HDL 34*  LDLCALC 81  TRIG 96  CHOLHDL 3.9   Thyroid Function Tests  Recent Labs  09/05/15 1935  TSH 1.973    Dg Chest Portable 1 View: 09/05/2015  CLINICAL DATA:  Chest pain for 1 day, smoker, shortness of breath, coronary artery disease post CABG, ischemic cardiomyopathy EXAM: PORTABLE CHEST 1 VIEW COMPARISON:  Portable exam 1413 hours compared to 08/05/2011 FINDINGS: LEFT subclavian transvenous pacemaker leads project over RIGHT atrium and RIGHT ventricle, unchanged. Normal heart size post CABG. Pulmonary vascular congestion. Lungs clear. No pleural effusion or pneumothorax. Bones unremarkable. IMPRESSION: Post CABG and AICD. No acute abnormalities. Electronically Signed   By: Lavonia Dana M.D.   On: 09/05/2015 14:25    Diagnostic Studies/Procedures     Echocardiogram: 09/06/2015  Study Conclusions - Left ventricle: The cavity size was severely dilated. Systolic  function was severely reduced. The estimated ejection fraction  was 20%. Severe diffuse hypokinesis with distinct regional wall  motion abnormalities. There is akinesis of the entireinferoseptal  and apical myocardium. There is akinesis of the apicalanterior,  lateral, and inferior myocardium. There is  akinesis of the  basal-midanteroseptal myocardium. Features are consistent with a  pseudonormal left ventricular filling pattern, with concomitant  abnormal relaxation and increased filling pressure (grade 2  diastolic dysfunction). - Mitral valve: There was mild regurgitation. - Left atrium: The atrium was mildly dilated. - Tricuspid valve: There was trivial regurgitation.  Cardiac Catheterization: 09/06/2015  LIMA .  Sequential LIMA-diagonal 1 and diagonal 2 is widely patent. This graft retrograde fills the LAD  Ost LAD to Prox LAD lesion, 100% stenosed. The lesion was previously treated with a bare metal stent.  LM lesion, 50% stenosed.  Mid Cx lesion, 99% stenosed.  1. Severe single vessel CAD with total occlusion of the proximal LAD within the previously implanted stent 2. Mild-moderate distal left main disease extending into the proximal circumflex 3. Widely patent dominant RCA 4. Widely patent sequential LIMA-diagonal 1 and 2 also  filling the LAD 5. Subtotal occlusion of the distal circumflex into a tiny OM, possible culprit for his NSTEMI 6. Known severe LV dysfunction    Disposition   Pt is being discharged home today in good condition.  Follow-up Plans & Appointments    Follow-up Information    Follow up with Lyda Jester, PA-C On 09/24/2015.   Specialties:  Cardiology, Radiology   Why:  Cardiology [facility] Follow-Up on 09/24/2015 at 9:00AM.   Contact information:   [place] [address] [phone]      Discharge Instructions    Diet - low sodium heart healthy    Complete by:  As directed      Discharge instructions    Complete by:  As directed   [place].  PLEASE ATTEND ALL SCHEDULED FOLLOW-UP APPOINTMENTS.   Activity: Increase activity slowly as tolerated. You may shower, but no soaking baths (or swimming) for 1 week. No driving for 24  hours. No lifting over 5 lbs for 1 week. No sexual activity for 1 week.   You May Return to Work: in 1 week (if applicable)  Wound Care: You may wash cath site gently with soap and water. Keep cath site clean and dry. If you notice pain, swelling, bleeding or pus at your cath site, please call [phone].     Increase activity slowly    Complete by:  As directed            Discharge Medications   Current Discharge Medication List    START taking these medications   Details  aspirin EC 81 MG EC tablet Take 1 tablet (81 mg total) by mouth daily.    atorvastatin (LIPITOR) 80 MG tablet Take 1 tablet (80 mg total) by mouth daily at 6 PM. Qty: 30 tablet, Refills: 6      CONTINUE these medications which have NOT CHANGED   Details  furosemide (LASIX) 40 MG tablet Take 1 tablet (40 mg total) by mouth daily. Qty: 30 tablet, Refills: 5    lisinopril (PRINIVIL,ZESTRIL) 10 MG tablet Take 10 mg by mouth daily.      isosorbide mononitrate (IMDUR) 60 MG 24 hr tablet Take 1 tablet (60 mg total) by mouth daily. Qty: 90 tablet, Refills: 3   Associated Diagnoses: Intermediate coronary syndrome (HCC)    nitroGLYCERIN (NITROSTAT) 0.4 MG SL tablet Place 1 tablet (0.4 mg total) under the tongue every 5 (five) minutes as needed. Qty: 25 tablet, Refills: 2   Associated Diagnoses: Intermediate coronary syndrome (HCC)      STOP taking these medications     aspirin 325 MG tablet      carvedilol (COREG) 6.25 MG tablet      pantoprazole (PROTONIX) 40 MG tablet          Aspirin prescribed at discharge?  Yes High Intensity Statin Prescribed? (Lipitor 40-80mg  or Crestor 20-40mg ): Yes Beta Blocker Prescribed? No: Cocaine Use For EF 40% or less, Was ACEI/ARB Prescribed? Yes ADP Receptor Inhibitor Prescribed? (i.e. Plavix etc.-Includes Medically Managed Patients): No: N/A - Cocaine Induced NSTEMI For EF <40%, Aldosterone Inhibitor Prescribed? No: Consider as an outpatient - Would need to be able  to follow-up regularly in regards to K+. Has not been seen by Cardiology in 3+ years prior to admission. Was EF assessed during THIS hospitalization? Yes - Echo and Cath Was Cardiac Rehab II ordered? (Included Medically managed Patients): No: Cocaine  Induced.   Allergies Allergies  Allergen Reactions  . Oxycodone Hcl Nausea And Vomiting     Outstanding Labs/Studies   None  Duration of Discharge Encounter   Greater than 30 minutes including physician time.  Signed, Erma Heritage, PA-C 09/07/2015, 11:14 AM    I have examined the patient and reviewed assessment and plan and discussed with patient. Agree with above as stated. I encouraged the patient to improve his lifestyle. MI was likely related to cocaine use. Will need f/u for his AICD as well.  Larae Grooms

## 2015-09-07 NOTE — Care Management Important Message (Signed)
Important Message  Patient Details  Name: John Davenport MRN: [id number] Date of Birth: [birth year]   Medicare Important Message Given:  Yes    [name] Abena 09/07/2015, 10:56 AM

## 2015-09-07 NOTE — Progress Notes (Addendum)
Hospital Problem List     Principal Problem:   NSTEMI (non-ST elevated myocardial infarction) [facility]) Active Problems:   CAD in native artery   Chronic systolic heart failure ([place])   GERD   Ischemic cardiomyopathy   Tobacco abuse   Hyperlipidemia   Renal insufficiency    Patient Profile:   Primary Cardiologist: Dr. Rayann Heman  40 y/o M w/ PMH of CAD (AMI s/p BMS to LAD 12/2007, MI 08/2008 in [place] with subsequent POBA to LAD stent for ISR in 08/2008, single vessel CABG in 03/2009 due to restenosis), ICM EF 30-35% (s/p [employer] Sci ICD [id number]), chronic systolic CHF, mild sleep apnea, HLD, paroxysmal VT, anxiety, GERD, tobacco abuse who presented to [facility] on 09/05/2015 with complaints of chest discomfort.  Subjective   Denies any chest discomfort of dyspnea overnight. No complications at left radial cath site.   Inpatient Medications    . [START ON 09/08/2015] aspirin  81 mg Oral Pre-Cath  . aspirin EC  81 mg Oral Daily  . atorvastatin  80 mg Oral q1800  . furosemide  40 mg Oral Daily  . isosorbide mononitrate  60 mg Oral Daily  . nicotine  14 mg Transdermal Daily  . pantoprazole  40 mg Oral Daily  . sodium chloride flush  3 mL Intravenous Q12H  . sodium chloride flush  3 mL Intravenous Q12H    Vital Signs    Filed Vitals:   09/06/15 1450 09/06/15 1652 09/06/15 2211 09/07/15 0450  BP: 118/85 114/72 113/59 123/78  Pulse: 73 86 73 73  Temp:  98.1 F (36.7 C) 98.2 F (36.8 C) 97.7 F (36.5 C)  TempSrc:  Oral Oral Oral  Resp: 22 16 18 17   Height:      Weight:    215 lb 4.8 oz (97.659 kg)  SpO2: 95% 100% 98% 97%    Intake/Output Summary (Last 24 hours) at 09/07/15 0858 Last data filed at 09/06/15 1530  Gross per 24 hour  Intake     89 ml  Output   2100 ml  Net  -2011 ml   Filed Weights   09/05/15 1250 09/06/15 0426 09/07/15 0450  Weight: 210 lb (95.255 kg) 219 lb 5.7 oz (99.5 kg) 215 lb 4.8 oz (97.659 kg)    Physical Exam    General: Well developed, well nourished,  male appearing in no acute distress. Head: Normocephalic, atraumatic.  Neck: Supple without bruits, JVD not elevated. Lungs:  Resp regular and unlabored, CTA without wheezing or rales. Heart: RRR, S1, S2, no S3, S4, or murmur; no rub. Abdomen: Soft, non-tender, non-distended with normoactive bowel sounds. No hepatomegaly. No rebound/guarding. No obvious abdominal masses. Extremities: No clubbing, cyanosis, or edema. Distal pedal pulses are 2+ bilaterally. Left radial cath site stable without ecchymosis or evidence of a hematoma. Neuro: Alert and oriented X 3. Moves all extremities spontaneously. Psych: Normal affect.  Labs    CBC  Recent Labs  09/05/15 1250 09/06/15 0420  WBC 14.7* 10.8*  HGB 15.1 13.3  HCT 46.8 42.4  MCV 90.0 90.2  PLT 203 [id number]   Basic Metabolic Panel  Recent Labs  09/05/15 1250 09/05/15 1935 09/06/15 0420  NA 141  --  140  K 4.1  --  3.8  CL 106  --  106  CO2 28  --  29  GLUCOSE 68  --  99  BUN 13  --  12  CREATININE 1.46*  --  1.26*  CALCIUM 9.6  --  8.7*  MG  --  1.9  --    Liver Function Tests  Recent Labs  09/05/15 1554  AST 53*  ALT 37  ALKPHOS 59  BILITOT 0.6  PROT 6.3*  ALBUMIN 3.1*   No results for input(s): LIPASE, AMYLASE in the last 72 hours. Cardiac Enzymes  Recent Labs  09/05/15 1554 09/05/15 2140 09/06/15 0400  TROPONINI 10.04* 6.66* 5.87*   Fasting Lipid Panel  Recent Labs  09/06/15 0420  CHOL 134  HDL 34*  LDLCALC 81  TRIG 96  CHOLHDL 3.9   Thyroid Function Tests  Recent Labs  09/05/15 1935  TSH 1.973    Telemetry    NSR, HR in 70's - 80's. Multiple PVC's.   ECG    No new tracings.    Cardiac Studies and Radiology    Dg Chest Portable 1 View: 09/05/2015  CLINICAL DATA:  Chest pain for 1 day, smoker, shortness of breath, coronary artery disease post CABG, ischemic cardiomyopathy EXAM: PORTABLE CHEST 1 VIEW COMPARISON:  Portable exam 1413 hours compared to 08/05/2011 FINDINGS: LEFT subclavian  transvenous pacemaker leads project over RIGHT atrium and RIGHT ventricle, unchanged. Normal heart size post CABG. Pulmonary vascular congestion. Lungs clear. No pleural effusion or pneumothorax. Bones unremarkable. IMPRESSION: Post CABG and AICD. No acute abnormalities. Electronically Signed   By: Lavonia Dana M.D.   On: 09/05/2015 14:25    Echocardiogram: 09/06/2015  Study Conclusions - Left ventricle: The cavity size was severely dilated. Systolic  function was severely reduced. The estimated ejection fraction  was 20%. Severe diffuse hypokinesis with distinct regional wall  motion abnormalities. There is akinesis of the entireinferoseptal  and apical myocardium. There is akinesis of the apicalanterior,  lateral, and inferior myocardium. There is akinesis of the  basal-midanteroseptal myocardium. Features are consistent with a  pseudonormal left ventricular filling pattern, with concomitant  abnormal relaxation and increased filling pressure (grade 2  diastolic dysfunction). - Mitral valve: There was mild regurgitation. - Left atrium: The atrium was mildly dilated. - Tricuspid valve: There was trivial regurgitation.  Cardiac Catheterization: 09/06/2015  LIMA .  Sequential LIMA-diagonal 1 and diagonal 2 is widely patent. This graft retrograde fills the LAD  Ost LAD to Prox LAD lesion, 100% stenosed. The lesion was previously treated with a bare metal stent.  LM lesion, 50% stenosed.  Mid Cx lesion, 99% stenosed.  1. Severe single vessel CAD with total occlusion of the proximal LAD within the previously implanted stent 2. Mild-moderate distal left main disease extending into the proximal circumflex 3. Widely patent dominant RCA 4. Widely patent sequential LIMA-diagonal 1 and 2 also filling the LAD 5. Subtotal occlusion of the distal circumflex into a tiny OM, possible culprit for his NSTEMI 6. Known severe LV dysfunction  Assessment & Plan    1. NSTEMI  - history of  CADs/p BMS to LAD 12/2007 with subsequent POBA to LAD stent in 08/2008 and eventual single vessel CABG (L-LAD) in 03/2009 due to restenosis. - troponin values this admission were 10.04, 6.66, and 5.87. - cardiac cath this admission showed a patent LIMA sequential -D1 and D2 with 100% stenosis of the Ost to Prox LAD with previously placed stent and  subtotal occlusion of the distal circumflex into a tiny OM, possible culprit for his NSTEMI. - will continue on ASA, statin, Imdur, and ACE-I. No BB secondary to cocaine use.  2. ICM/Chronic systolic CHF - Echo this admission shows EF of 20% with diffuse hypokinesis. S/p ICD implantation 12/2008. - appears euvolemic  on examination. Continue Lasix and ACE-I.   3. Hyperlipidemia  - LDL 81 this admission, not at goal. Switched to high-intensity statin.  4. Tobacco abuse - cessation advised.  5. Mild renal insufficiency - creatinine improved to 1.26 today.  6. Substance Abuse - UDS positive for Cocaine. Patient denies any recent cocaine use. Admits to using THC the morning of his CP, as a way to assist with the pain. Was educated about the importance of avoiding Cocaine with his ICD implantation and known heart disease.   Signed, Erma Heritage , PA-C 8:58 AM 09/07/2015 Pager: [phone]   I have examined the patient and reviewed assessment and plan and discussed with patient.  Agree with above as stated.  I encouraged the patient to improve his lifestyle.  MI was likely related to cocaine use.  Will need f/u for his AICD as well.  Larae Grooms

## 2015-09-07 NOTE — Clinical Documentation Improvement (Signed)
Cardiology  Please clarify and document findings in next progress note if the patient's NSTEMI was:   Cocaine induced NSTEMI - codes to poisoning  Mid Cx lesion, 99% stenosed. Small vessel, subtotal occlusion into a tiny margin vessel, suspected culprit for NSTEMI - codes to complication code  Combination of both conditions  Other  Clinically Undetermined  Supporting Information:  Please exercise your independent, professional judgment when responding. A specific answer is not anticipated or expected.  Thank You, Zoila Shutter RN, BSN, [place] [phone]; Cell: [phone]

## 2015-09-07 NOTE — Progress Notes (Signed)
Patient in stable condition, this RN went over discharge instructions with patient , patient verbalised understanding, tele dc,ccmd notified, iv removed, patient belongings at bedside, patient taken off the unit on wheelchair

## 2015-09-24 ENCOUNTER — Encounter: Payer: Self-pay | Admitting: Cardiology

## 2015-09-24 ENCOUNTER — Encounter: Payer: Self-pay | Admitting: Internal Medicine

## 2015-09-24 ENCOUNTER — Ambulatory Visit (INDEPENDENT_AMBULATORY_CARE_PROVIDER_SITE_OTHER): Payer: Medicare Other | Admitting: *Deleted

## 2015-09-24 ENCOUNTER — Ambulatory Visit (INDEPENDENT_AMBULATORY_CARE_PROVIDER_SITE_OTHER): Payer: Medicare Other | Admitting: Cardiology

## 2015-09-24 ENCOUNTER — Telehealth: Payer: Self-pay

## 2015-09-24 VITALS — BP 104/78 | HR 66 | Ht 75.0 in | Wt 221.0 lb

## 2015-09-24 DIAGNOSIS — I472 Ventricular tachycardia: Secondary | ICD-10-CM

## 2015-09-24 DIAGNOSIS — I255 Ischemic cardiomyopathy: Secondary | ICD-10-CM

## 2015-09-24 DIAGNOSIS — I200 Unstable angina: Secondary | ICD-10-CM | POA: Diagnosis not present

## 2015-09-24 DIAGNOSIS — I4729 Other ventricular tachycardia: Secondary | ICD-10-CM

## 2015-09-24 LAB — CUP PACEART INCLINIC DEVICE CHECK
Date Time Interrogation Session: 20170626040000
HIGH POWER IMPEDANCE MEASURED VALUE: 43 Ohm
HIGH POWER IMPEDANCE MEASURED VALUE: 64 Ohm
Implantable Lead Implant Date: 20101005
Implantable Lead Location: 753859
Lead Channel Impedance Value: 476 Ohm
Lead Channel Pacing Threshold Pulse Width: 0.4 ms
Lead Channel Pacing Threshold Pulse Width: 0.4 ms
Lead Channel Sensing Intrinsic Amplitude: 17.3 mV
Lead Channel Setting Pacing Amplitude: 2 V
Lead Channel Setting Sensing Sensitivity: 0.6 mV
MDC IDC LEAD IMPLANT DT: 20101005
MDC IDC LEAD LOCATION: 753860
MDC IDC LEAD MODEL: 158
MDC IDC LEAD MODEL: 4136
MDC IDC LEAD SERIAL: 28628809
MDC IDC LEAD SERIAL: 300495
MDC IDC MSMT LEADCHNL RA IMPEDANCE VALUE: 646 Ohm
MDC IDC MSMT LEADCHNL RA PACING THRESHOLD AMPLITUDE: 0.7 V
MDC IDC MSMT LEADCHNL RA SENSING INTR AMPL: 11 mV
MDC IDC MSMT LEADCHNL RV PACING THRESHOLD AMPLITUDE: 1.4 V
MDC IDC PG SERIAL: 152061
MDC IDC SET LEADCHNL RV PACING AMPLITUDE: 2.8 V
MDC IDC SET LEADCHNL RV PACING PULSEWIDTH: 0.4 ms

## 2015-09-24 MED ORDER — ISOSORBIDE MONONITRATE ER 60 MG PO TB24
60.0000 mg | ORAL_TABLET | Freq: Every day | ORAL | Status: DC
Start: 2015-09-24 — End: 2016-03-11

## 2015-09-24 MED ORDER — NITROGLYCERIN 0.4 MG SL SUBL: 0.4000 mg | SUBLINGUAL_TABLET | SUBLINGUAL | Status: DC | PRN

## 2015-09-24 MED ORDER — LOVASTATIN 20 MG PO TABS: 20.0000 mg | ORAL_TABLET | Freq: Every day | ORAL | Status: DC

## 2015-09-24 MED ORDER — LISINOPRIL 10 MG PO TABS: 10.0000 mg | ORAL_TABLET | Freq: Every day | ORAL | Status: DC

## 2015-09-24 MED ORDER — VARENICLINE TARTRATE 0.5 MG X 11 & 1 MG X 42 PO MISC: ORAL | Status: DC

## 2015-09-24 MED ORDER — FUROSEMIDE 40 MG PO TABS: 40.0000 mg | ORAL_TABLET | Freq: Every day | ORAL | Status: DC

## 2015-09-24 NOTE — Addendum Note (Signed)
Addended by: Claude Manges on: 09/24/2015 02:01 PM   Modules accepted: Orders

## 2015-09-24 NOTE — Progress Notes (Signed)
ICD check in clinic w/ Lyda Jester, PA. Normal device function. Thresholds and sensing consistent with previous device measurements. Impedance trends stable over time. 105 total ventricular arrhythmias since 02/05/2011--(1) episode treated with ATP on 07/18/2013--no EGM. No mode switches. Histogram distribution appropriate for patient and level of activity. RV output decreased from 3.0V to 2.8V. Device programmed at appropriate safety margins. Device programmed to optimize intrinsic conduction. Estimated longevity 5.5 years. Pt will follow up with JA in 11-2015 to reestablish.

## 2015-09-24 NOTE — Progress Notes (Signed)
09/24/2015 [name]   [birth year]  [id number]  Primary Physician No PCP Per Patient Primary Cardiologist: Dr. Rayann Heman   Reason for Visit/CC: [facility] f/u for NSTEMI   HPI:  Mr. Calamia is a 40 y/o male with past medical history of CAD (AMI s/p BMS to LAD 12/2007, MI 08/2008 in [place] with subsequent POBA to LAD stent for ISR in 08/2008, single vessel CABG in 03/2009 due to restenosis), ICM EF 30-35% (s/p Boston Sci ICD [id number]), chronic systolic CHF, mild sleep apnea, HLD, paroxysmal VT, anxiety, GERD, tobacco abuse who presented to [facility] on 09/05/2015 for evaluation of chest pain. He ruled in for NSTEMI.  Troponin peaked at 10. Subsequent cardiac catheterization was performed later that day and showed a patent LIMA sequential -D1 and D2 with 100% stenosis of the Ost to Prox LAD with previously placed stent andsubtotal occlusion of the distal circumflex into a tiny OM, possible culprit for his NSTEMI. Granted, with his known Cocaine use this is also a likely etiology of his NSTEMI. He tolerated the procedure well and no complications were noted. A repeat echocardiogram was performed and showed an EF of 20% with severe diffuse hypokinesis with distinct regional wall motion abnormalities and akinesis of the entire inferoseptal and apical myocardium. Grade 2 DD was noted as well. He was continued on medical therapy. No BB was prescribed given cocaine use.  He presents back for post hospital f/u. He reports that he has done well. No recurrent CP. No dyspnea. Cath site is well healed and stable. He denies any further cocaine use but admits to heavy tobacco  use. He wants to quit and has tried but unsuccessful. No success with patches or nicotine gum. He has a difficult social situation. He is on disability. He lives alone and states he has no money. His mom, whom he was living with moved to [place]. No working land phone. No personal vehicle.  His financial situation creates great  stress causing him to smoke more. He is unable to afford his meds. He also has been unable to afford office co-pays. He has not been in to see Dr. Rayann Heman in 5 years. He has an ICD that is 42 years old. No interrogation since his last OV in 2012. He cannot do remote transmissions from home b/c he does not have a land phone.    Current Outpatient Prescriptions  Medication Sig Dispense Refill  . aspirin EC 81 MG EC tablet Take 1 tablet (81 mg total) by mouth daily.    Marland Kitchen atorvastatin (LIPITOR) 80 MG tablet Take 1 tablet (80 mg total) by mouth daily at 6 PM. 30 tablet 6  . furosemide (LASIX) 40 MG tablet Take 1 tablet (40 mg total) by mouth daily. 30 tablet 5  . lisinopril (PRINIVIL,ZESTRIL) 10 MG tablet Take 10 mg by mouth daily.      . nitroGLYCERIN (NITROSTAT) 0.4 MG SL tablet Place 1 tablet (0.4 mg total) under the tongue every 5 (five) minutes as needed. 25 tablet 2  . isosorbide mononitrate (IMDUR) 60 MG 24 hr tablet Take 1 tablet (60 mg total) by mouth daily. 90 tablet 3   No current facility-administered medications for this visit.    Allergies  Allergen Reactions  . Oxycodone Hcl Nausea And Vomiting    Social History   Social History  . Marital Status: Single    Spouse Name: N/A  . Number of Children: 1  . Years of Education: N/A   Occupational History  .  disabled    Social History Main Topics  . Smoking status: Current Every Day Smoker -- 1.00 packs/day for 17 years    Types: Cigarettes  . Smokeless tobacco: Not on file  . Alcohol Use: No  . Drug Use: No     Comment: REMOTE H/O SUBSTANCE ABUSE  . Sexual Activity: Not on file   Other Topics Concern  . Not on file   Social History Narrative   Pt lives with his mom, brother, and 63 yo son in [place].   Quit tobacco in Jan. 2011 prior to heart surgery (prior -1/2 PPD (down from 1 ppd per day, onset age 50)    alcohol abuse- prior, hasn't drank since age 74   no illicit drug use    Currently unemployed, awaiting  disability   Single           Review of Systems: General: negative for chills, fever, night sweats or weight changes.  Cardiovascular: negative for chest pain, dyspnea on exertion, edema, orthopnea, palpitations, paroxysmal nocturnal dyspnea or shortness of breath Dermatological: negative for rash Respiratory: negative for cough or wheezing Urologic: negative for hematuria Abdominal: negative for nausea, vomiting, diarrhea, bright red blood per rectum, melena, or hematemesis Neurologic: negative for visual changes, syncope, or dizziness All other systems reviewed and are otherwise negative except as noted above.    Blood pressure 104/78, pulse 66, height 6\' 3"  (1.905 m), weight 221 lb (100.245 kg).  General appearance: alert, cooperative, no distress and dentition in poor repair Neck: no carotid bruit and no JVD Lungs: clear to auscultation bilaterally Heart: regular rate and rhythm, S1, S2 normal, no murmur, click, rub or gallop Extremities: no LEE' Pulses: 2+ and symmetric Skin: warm and dry Neurologic: Grossly normal  EKG NSR. No ischemia.   ASSESSMENT AND PLAN:   1. CAD: s/p CABG. Recent presentation to [facility] with chest pain and + troponin felt secondary to cocaine use. No PCI targets on cath. Medical therapy recommended. No BBs given cocaine use. He denies any recurrent CP. Continue ASA, Imdur, and lisinopril. Will change statin from Lipitor to Lovastatin, 40 mg, given this is on the 4$ Rx list at [facility].   2. Chronic Systolic HF/ ICM: stable. Euvolemic on exam. No dyspnea. Continue lasix. Continue ACE-I therapy with lisinopril.   3. ICD: Device was checked in clinic today by EP team. He has had 105 episodes of NSVT since his last interrogation. Battery life is 5 years. Device tech noted she will attempt to get patient a cell phone adapter for remote home downloads. F/u with Dr. Rayann Heman in 3 months.   4. Tobacco Abuse: smoking cessation advised. Patient encouraged to try OTC  patches. Will attempt to order Chantix, but this will likely be expensive.   5. Cocaine Abuse: Patient denies any recurrent use. ? Truthfulness. No BBs.   6. Medication Cost Issues: I've reviewed medication list and contacted [facility]. Imdur and lisinopril is covered for $4 for 1 month supply. I've changed statin to Lovastatin as this is also on the $4 list.   PLAN  F/u with Dr. Rayann Heman in 3 months.   Lylian Sanagustin PA-C 09/24/2015 9:05 AM

## 2015-09-24 NOTE — Patient Instructions (Addendum)
Medication Instructions:   START TAKING LOVASTATIN  STOP TAKING ATORVASTATIN  REFILLS HAS BEEN SENT INTO [place]   If you need a refill on your cardiac medications before your next appointment, please call your pharmacy.  Labwork: NONE ORDER TODAY    Testing/Procedures:  NONE ORDER TODAY    Follow-Up: IN 3 MONTHS WITH DR Rayann Heman   Any Other Special Instructions Will Be Listed Below (If Applicable).

## 2015-09-24 NOTE — Telephone Encounter (Signed)
Patient calling in about medication not sent in after appt this morning. He is not sure if he is supposed to be taking lasix or not and also wants a Rx for Chantix.

## 2015-09-24 NOTE — Telephone Encounter (Signed)
SPOKE TO PT AND VERIFIED FROM PROVIDER  TO CONTINUE LASIX RX SENT IN ELECTRONICALLY AND RX FOR CHANTIX PRINTED AND SIGNED BY PROVIDER SIMMONS PA-C AND LEFT AT FRONT DESK TO BE SIGNED OUT AND PICKED UP

## 2015-10-15 ENCOUNTER — Telehealth: Payer: Self-pay | Admitting: Internal Medicine

## 2015-10-15 NOTE — Telephone Encounter (Signed)
New message     Pt c/o medication issue:  1. Name of Medication: Lisinorpil  2. How are you currently taking this medication (dosage and times per day)? Not certain of dose per caller  3. Are you having a reaction (difficulty breathing--STAT)? no  4. What is your medication issue? The pt states to the caller feels sleepy and tired and sleeps all the time    Pt c/o BP issue: STAT if pt c/o blurred vision, one-sided weakness or slurred speech  1. What are your last 5 BP readings? 80/40 last b/p reading according to the caller  2. Are you having any other symptoms (ex. Dizziness, headache, blurred vision, passed out)? The caller states yes  3. What is your BP issue? Feeling tied and sleepy

## 2015-10-15 NOTE — Telephone Encounter (Signed)
LMTCB

## 2015-11-01 NOTE — Telephone Encounter (Signed)
Left another message for patient to call back if he was still having problems

## 2015-12-26 ENCOUNTER — Ambulatory Visit (INDEPENDENT_AMBULATORY_CARE_PROVIDER_SITE_OTHER): Payer: Medicare Other | Admitting: Internal Medicine

## 2015-12-26 ENCOUNTER — Encounter: Payer: Self-pay | Admitting: Internal Medicine

## 2015-12-26 VITALS — BP 118/76 | HR 81 | Ht 75.0 in | Wt 221.0 lb

## 2015-12-26 DIAGNOSIS — I4729 Other ventricular tachycardia: Secondary | ICD-10-CM

## 2015-12-26 DIAGNOSIS — I472 Ventricular tachycardia, unspecified: Secondary | ICD-10-CM

## 2015-12-26 DIAGNOSIS — I2589 Other forms of chronic ischemic heart disease: Secondary | ICD-10-CM

## 2015-12-26 DIAGNOSIS — I5022 Chronic systolic (congestive) heart failure: Secondary | ICD-10-CM

## 2015-12-26 DIAGNOSIS — I255 Ischemic cardiomyopathy: Secondary | ICD-10-CM | POA: Diagnosis not present

## 2015-12-26 DIAGNOSIS — I200 Unstable angina: Secondary | ICD-10-CM

## 2015-12-26 LAB — CUP PACEART INCLINIC DEVICE CHECK
Implantable Lead Implant Date: 20101005
Implantable Lead Location: 753860
Implantable Lead Model: 158
Implantable Lead Serial Number: 28628809
MDC IDC LEAD IMPLANT DT: 20101005
MDC IDC LEAD LOCATION: 753859
MDC IDC LEAD MODEL: 4136
MDC IDC LEAD SERIAL: 300495
MDC IDC SESS DTM: 20170927101000
Pulse Gen Serial Number: 152061

## 2015-12-26 MED ORDER — CARVEDILOL 6.25 MG PO TABS: 6.2500 mg | ORAL_TABLET | Freq: Two times a day (BID) | ORAL | 3 refills | Status: DC

## 2015-12-26 NOTE — Progress Notes (Signed)
Electrophysiology Office Note   Date:  12/26/2015   ID:  John Davenport, DOB 22-Sep-1975, MRN [id number]  Cardiologist:  none Primary Electrophysiologist: Thompson Grayer, MD    Chief Complaint  Patient presents with  . Follow-up    Chronic systolic heart failure     History of Present Illness: John Davenport is a 40 y.o. male who presents today for electrophysiology evaluation.   He is s/p prior MI.  He was previously followed by Dr Leonia Reeves.  I have followed him previously for ischemic CM/ ICD.  He has not seen me since 2012! He reports having difficulty with finances, travel, etc.  He is not working.  He states "I just sit around all the time".  He reports SOB with moderate activity which is stable.  He continues to smoke because he says he "cant afford chantix".  Past Medical History:  Diagnosis Date  . Anxiety state, unspecified   . Back pain   . Chronic systolic heart failure ([place])    a. Ischemic CM (EF 30% by echo in 2011) b. echo 08/2015: EF 20% with severe HK and AK of the inferoseptal and apical myocardium.  . Coronary artery disease    a. 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  b. eventually required single vessel CABG (L-LAD) in 03/2009 due to restenosis. c. cath 2013 showed patent LIMA sequential to the diags with LAD filling retrograde, no new disease in RCA and Cx, low LVEDP, sx felt noncardiac. d. 08/2015: NSTEMI: CTO of prox LAD, 99% stenosis of small dCx (no stent), + for cocaine  . Esophageal reflux   . Heart attack ([place])   . Hypercholesteremia   . ICD (implantable cardioverter-defibrillator) infection ([place])    a. Boston Sci ICD 12/2008.   . Ischemic cardiomyopathy    echocardiogram 12/11: Mild LVH, EF 30-35%, anteroseptal and apical akinesis, grade 1 diastolic dysfunction  . LBP (low back pain)   . OSA (obstructive sleep apnea) 12/30/2010  . Paroxysmal ventricular tachycardia ([place])   . Tobacco abuse    Past  Surgical History:  Procedure Laterality Date  . CARDIAC CATHETERIZATION N/A 09/06/2015   Procedure: Left Heart Cath and Cors/Grafts Angiography;  Surgeon: Sherren Mocha, MD;  Location: [facility];  Service: Cardiovascular;  Laterality: N/A;  . CARDIAC DEFIBRILLATOR PLACEMENT    . CARDIAC DEFIBRILLATOR PLACEMENT     2010 by JA  . CORONARY ANGIOPLASTY WITH STENT PLACEMENT     LAD stenting 10/09 and re-opening 6/10  . CORONARY ARTERY BYPASS GRAFT     1/11  . LEFT HEART CATHETERIZATION WITH CORONARY ANGIOGRAM N/A 08/07/2011   Procedure: LEFT HEART CATHETERIZATION WITH CORONARY ANGIOGRAM;  Surgeon: Josue Hector, MD;  Location: [facility] CATH LAB;  Service: Cardiovascular;  Laterality: N/A;     Current Outpatient Prescriptions  Medication Sig Dispense Refill  . aspirin EC 81 MG EC tablet Take 1 tablet (81 mg total) by mouth daily.    . furosemide (LASIX) 40 MG tablet Take 1 tablet (40 mg total) by mouth daily. 30 tablet 5  . isosorbide mononitrate (IMDUR) 60 MG 24 hr tablet Take 1 tablet (60 mg total) by mouth daily. 30 tablet 6  . lisinopril (PRINIVIL,ZESTRIL) 10 MG tablet Take 1 tablet (10 mg total) by mouth daily. 30 tablet 6  . lovastatin (MEVACOR) 20 MG tablet Take 1 tablet (20 mg total) by mouth at bedtime. 30 tablet 6  . nitroGLYCERIN (NITROSTAT)  0.4 MG SL tablet Place 1 tablet (0.4 mg total) under the tongue every 5 (five) minutes as needed for chest pain. 100 tablet 0  . varenicline (CHANTIX PAK) 0.5 MG X 11 & 1 MG X 42 tablet Take one 0.5 mg tablet by mouth once daily for 3 days, then increase to one 0.5 mg tablet twice daily for 4 days, then increase to one 1 mg tablet twice daily. 53 tablet 0   No current facility-administered medications for this visit.     Allergies:   Oxycodone hcl   Social History:  The patient  reports that he has been smoking Cigarettes.  He has a 17.00 pack-year smoking history. He does not have any smokeless tobacco history on file. He reports that he  does not drink alcohol or use drugs.   Family History:  The patient's family history includes COPD in his maternal grandmother; Heart attack (age of onset: 43) in his father.    ROS:  Please see the history of present illness.   All other systems are reviewed and negative.    PHYSICAL EXAM: VS:  BP 118/76   Pulse 81   Ht 6\' 3"  (1.905 m)   Wt 221 lb (100.2 kg)   BMI 27.62 kg/m  , BMI Body mass index is 27.62 kg/m. GEN: Well nourished, well developed, disheveled, in no acute distress  HEENT: normal  Neck: no JVD, carotid bruits, or masses Cardiac: RRR; no murmurs, rubs, or gallops,no edema  Respiratory:  clear to auscultation bilaterally, normal work of breathing GI: soft, nontender, nondistended, + BS MS: no deformity or atrophy  Skin: warm and dry, device pocket is well healed Neuro:  Strength and sensation are intact Psych: euthymic mood, full affect  EKG:  EKG is not ordered today  Device interrogation is reviewed today in detail.  See PaceArt for details.   Recent Labs: 09/05/2015: ALT 37; Magnesium 1.9; TSH 1.973 09/06/2015: BUN 12; Creatinine, Ser 1.26; Hemoglobin 13.3; Platelets 174; Potassium 3.8; Sodium 140    Lipid Panel     Component Value Date/Time   CHOL 134 09/06/2015 0420   TRIG 96 09/06/2015 0420   HDL 34 (L) 09/06/2015 0420   CHOLHDL 3.9 09/06/2015 0420   VLDL 19 09/06/2015 0420   LDLCALC 81 09/06/2015 0420     Wt Readings from Last 3 Encounters:  12/26/15 221 lb (100.2 kg)  09/24/15 221 lb (100.2 kg)  09/07/15 215 lb 4.8 oz (97.7 kg)     ASSESSMENT AND PLAN:  1.  Ischemic CM/ chronic systolic dysfunction Doing reasonably well given his overall lack of compliance. Will restart coreg 6.25mg  BID today. I will refer to CHF clinic.  I am hoping that they may have additional resources for him.  Normal ICD function See Claudia Desanctis Art report No changes today Will enroll in [place]  2. Tobacco Cessation strongly advised   Follow-up: return to see  me in 1 year Compliance with remotes encouraged  Current medicines are reviewed at length with the patient today.   The patient does not have concerns regarding his medicines.  The following changes were made today:  none   Signed, Thompson Grayer, MD  12/26/2015 10:01 AM     [facility] [address] [place] [address] [phone] (office) [phone] (fax)

## 2015-12-26 NOTE — Patient Instructions (Signed)
Medication Instructions:  Your physician has recommended you make the following change in your medication:  1) Restart Carvedilol 6.25 mg twice daily   Labwork: None ordered   Testing/Procedures: None ordered   Follow-Up:  You have been referred to CHF clinic     Your physician wants you to follow-up in: 12 months with Chanetta Marshall, NP or Dr Vallery Ridge will receive a reminder letter in the mail two months in advance. If you don't receive a letter, please call our office to schedule the follow-up appointment.   Remote monitoring is used to monitor your  ICD from home. This monitoring reduces the number of office visits required to check your device to one time per year. It allows [place] to keep an eye on the functioning of your device to ensure it is working properly. You are scheduled for a device check from home on 03/26/16. You may send your transmission at any time that day. If you have a wireless device, the transmission will be sent automatically. After your physician reviews your transmission, you will receive a postcard with your next transmission date.

## 2016-01-15 ENCOUNTER — Encounter (HOSPITAL_COMMUNITY): Payer: Self-pay

## 2016-01-15 ENCOUNTER — Ambulatory Visit (HOSPITAL_COMMUNITY)
Admission: RE | Admit: 2016-01-15 | Discharge: 2016-01-15 | Disposition: A | Discharge status: Home / Self Care | Payer: Medicare Other | Source: Ambulatory Visit | Attending: Cardiology | Admitting: Cardiology

## 2016-01-15 VITALS — BP 128/84 | HR 76 | Wt 230.8 lb

## 2016-01-15 DIAGNOSIS — I5022 Chronic systolic (congestive) heart failure: Secondary | ICD-10-CM | POA: Diagnosis not present

## 2016-01-15 DIAGNOSIS — I255 Ischemic cardiomyopathy: Secondary | ICD-10-CM | POA: Insufficient documentation

## 2016-01-15 DIAGNOSIS — Z8249 Family history of ischemic heart disease and other diseases of the circulatory system: Secondary | ICD-10-CM | POA: Diagnosis not present

## 2016-01-15 DIAGNOSIS — I252 Old myocardial infarction: Secondary | ICD-10-CM | POA: Insufficient documentation

## 2016-01-15 DIAGNOSIS — J449 Chronic obstructive pulmonary disease, unspecified: Secondary | ICD-10-CM | POA: Diagnosis not present

## 2016-01-15 DIAGNOSIS — F17200 Nicotine dependence, unspecified, uncomplicated: Secondary | ICD-10-CM | POA: Diagnosis not present

## 2016-01-15 DIAGNOSIS — Z951 Presence of aortocoronary bypass graft: Secondary | ICD-10-CM | POA: Diagnosis not present

## 2016-01-15 DIAGNOSIS — G4733 Obstructive sleep apnea (adult) (pediatric): Secondary | ICD-10-CM | POA: Insufficient documentation

## 2016-01-15 DIAGNOSIS — K219 Gastro-esophageal reflux disease without esophagitis: Secondary | ICD-10-CM | POA: Diagnosis not present

## 2016-01-15 DIAGNOSIS — I25812 Atherosclerosis of bypass graft of coronary artery of transplanted heart without angina pectoris: Secondary | ICD-10-CM | POA: Diagnosis not present

## 2016-01-15 DIAGNOSIS — Z7982 Long term (current) use of aspirin: Secondary | ICD-10-CM | POA: Insufficient documentation

## 2016-01-15 DIAGNOSIS — I2510 Atherosclerotic heart disease of native coronary artery without angina pectoris: Secondary | ICD-10-CM | POA: Diagnosis not present

## 2016-01-15 LAB — LIPID PANEL
CHOLESTEROL: 175 mg/dL (ref 0–200)
HDL: 40 mg/dL — ABNORMAL LOW (ref >40)
LDL Cholesterol: 122 mg/dL — ABNORMAL HIGH (ref 0–99)
Total CHOL/HDL Ratio: 4.4 RATIO
Triglycerides: 65 mg/dL (ref <150)
VLDL: 13 mg/dL (ref 0–40)

## 2016-01-15 LAB — BRAIN NATRIURETIC PEPTIDE: B Natriuretic Peptide: 474.6 pg/mL — ABNORMAL HIGH (ref 0.0–100.0)

## 2016-01-15 LAB — BASIC METABOLIC PANEL
Anion gap: 7 (ref 5–15)
BUN: 14 mg/dL (ref 6–20)
CO2: 24 mmol/L (ref 22–32)
CREATININE: 1.42 mg/dL — AB (ref 0.61–1.24)
Calcium: 9.2 mg/dL (ref 8.9–10.3)
Chloride: 108 mmol/L (ref 101–111)
GFR calc Af Amer: >60 mL/min (ref >60)
GFR, EST NON AFRICAN AMERICAN: 60 mL/min — AB (ref >60)
Glucose, Bld: 77 mg/dL (ref 65–99)
Potassium: 4 mmol/L (ref 3.5–5.1)
SODIUM: 139 mmol/L (ref 135–145)

## 2016-01-15 MED ORDER — SACUBITRIL-VALSARTAN 24-26 MG PO TABS: 1.0000 | ORAL_TABLET | Freq: Two times a day (BID) | ORAL | 11 refills | Status: DC

## 2016-01-15 MED ORDER — VARENICLINE TARTRATE 1 MG PO TABS: ORAL_TABLET | ORAL | 11 refills | Status: DC

## 2016-01-15 MED ORDER — SACUBITRIL-VALSARTAN 24-26 MG PO TABS: 1.0000 | ORAL_TABLET | Freq: Two times a day (BID) | ORAL | 3 refills | Status: DC

## 2016-01-15 MED ORDER — POTASSIUM CHLORIDE CRYS ER 20 MEQ PO TBCR: 20.0000 meq | EXTENDED_RELEASE_TABLET | Freq: Every day | ORAL | 3 refills | Status: DC

## 2016-01-15 MED ORDER — FUROSEMIDE 40 MG PO TABS: ORAL_TABLET | ORAL | 5 refills | Status: DC

## 2016-01-15 NOTE — Progress Notes (Signed)
CSW referred to assist patient with prescription assistance. Patient reports he has been receiving SSD for a few years and has been on Medicare since 2011. Patient reports he can't afford the prescriptions and has never applied for Medicare D. Patient states multiple financial issues and struggles with obtaining medications. Patient reluctant to apply for assistance because "can't afford the cost of the insurance".  CSW discussed contacting [employer] and Extra Help program to assist with insurance coverage and premium reduction. Patient also noted he would like to quit smoking and was provided number for quit smoking line. CSW also provided transport options as well as food banks to help with financial needs. Patient verbalizes understanding of resources and follow up needed. Patient will return call to CSW with follow up. CSW continues to be available as needed. Raquel Sarna, LCSW [phone]

## 2016-01-15 NOTE — Patient Instructions (Signed)
STOP Lisinopril.  In 36 hours START Entresto 24/26mg  (1 tablet) twice daily.   INCREASE Lasix to 40mg  in the AM and 20mg  in the PM.  START Potassium 30meq daily.  START Chantix 1/2 tablet daily days 1-3. Then take 1/2 tablet twice daily days 4-7.  Then take 1 tablet twice daily.    Routine lab work today. Will notify you of abnormal results  Follow up and labs in 2 weeks.

## 2016-01-16 NOTE — Progress Notes (Signed)
Cardiology: Dr. Aundra Dubin  40 yo with history of CAD s/p CABG, chronic systolic CHF (ischemic cardiomyopathy), and COPD presents for CHF clinic evaluation.  He had his first MI with occlusion of LAD in 10/9 treated with BMS.  He Then had POBA of reocclusion of LAD stent in 6/10. Finally, he had sequential LIMA-D1 and D2 in 1/11.  NSTEMI in 6/17 (cocaine positive) showed 99% stenosis small distal LCx as likely culprit. This was medically managed. Echo in 6/17 showed EF 20% with wall motion abnormalities.   Patient has no chest pain but has significant exertional dyspnea.  He is short of breath walking 1 block or walking up stairs. No orthopnea/PND. He is generally fatigued.  He is significantly limited by his symptoms.  Still smoking.  Management is complicated by lack of prescription insurance (has disability with Medicare but no prescription coverage).   ECG (6/17): NSR, IVCD 128 msec  Labs (6/17): K 3.8, creatinine 1.26, HCT 42.4, LDL 81, HDL 34   PMH: 1. GERD 2. OSA 3. H/o VT 4. COPD: Active smoker. 5. Chronic systolic CHF: Ischemic cardiomyopathy. Has [employer] ICD.  - Echo (6/17): EF 20%, wall motion abnormalities, mild MR 6. CAD: Acute MI in 10/09 with BMS to LAD - POBA to LAD stent in 6/10.  - CABG with sequential LIMA-D1 and D2 in 1/11. - NSTEMI 6/17: LHC with patent sequential LIMA-D1 and D2 with filling of LAD, occlusion proximal LAD, subtotal occlusion of distal LCx into small OM (likely culprit).  No PCI. He was cocaine positive.   SH: Disabled, active smoker, history of cocaine use, no ETOH, lives in [place].  FH: Father with 1st MI in his 85s.  ROS: All systems reviewed and negative except as per HPI.   Current Outpatient Prescriptions  Medication Sig Dispense Refill  . aspirin EC 81 MG EC tablet Take 1 tablet (81 mg total) by mouth daily.    . carvedilol (COREG) 6.25 MG tablet Take 1 tablet (6.25 mg total) by mouth 2 (two) times daily. 180 tablet 3  . furosemide  (LASIX) 40 MG tablet Take 40mg  (1 tablet)  in the AM and 20mg  (1/2 tablet)  in the PM 45 tablet 5  . isosorbide mononitrate (IMDUR) 60 MG 24 hr tablet Take 1 tablet (60 mg total) by mouth daily. 30 tablet 6  . lovastatin (MEVACOR) 20 MG tablet Take 1 tablet (20 mg total) by mouth at bedtime. 30 tablet 6  . nitroGLYCERIN (NITROSTAT) 0.4 MG SL tablet Place 1 tablet (0.4 mg total) under the tongue every 5 (five) minutes as needed for chest pain. (Patient not taking: Reported on 01/15/2016) 100 tablet 0  . potassium chloride SA (K-DUR,KLOR-CON) 20 MEQ tablet Take 1 tablet (20 mEq total) by mouth daily. 30 tablet 3  . sacubitril-valsartan (ENTRESTO) 24-26 MG Take 1 tablet by mouth 2 (two) times daily. 60 tablet 3  . varenicline (CHANTIX) 1 MG tablet Take 1/2 tablet daily days 1-3. Then take 1/2 tablet twice daily days 4-7.  Then take 1 tablet twice daily. 60 tablet 11   No current facility-administered medications for this encounter.    BP 128/84   Pulse 76   Wt 230 lb 12 oz (104.7 kg)   SpO2 100%   BMI 28.84 kg/m  General: NAD Neck: JVP 8-9 cm, no thyromegaly or thyroid nodule.  Lungs: Clear to auscultation bilaterally with normal respiratory effort. CV: Nondisplaced PMI.  Heart regular S1/S2, no S3/S4, no murmur.  No peripheral edema.  No  carotid bruit.  Normal pedal pulses.  Abdomen: Soft, nontender, no hepatosplenomegaly, no distention.  Skin: Intact without lesions or rashes.  Neurologic: Alert and oriented x 3.  Psych: Normal affect. Extremities: No clubbing or cyanosis.  HEENT: Normal.   Assessment/Plan: 1. CAD: s/p CABG.  6/17 NSTEMI with subtotal occlusion small distal LCx managed medically.  No chest pain.  - Continue ASA 81.  Not on Plavix but this would be a reasonable addition.  - Continue statin.  Lovastatin is not ideal but apparently he was unable to afford atorvastatin. Check lipids today, will probably need to try to get him back on atorvastatin.  2. Chronic systolic CHF:  Ischemic CMP.  [place].  EF 20% on 6/17 echo.  NYHA class III symptoms with volume overload on exam.  COPD may also play a role in his dyspnea.  - Increase Lasix to 40 qam/20 qpm.   - Stop lisinopril, after 36 hrs start Entresto 24/26 bid.  - Continue Coreg .  - Will need to add spironolactone at next visit. - BMET/BNP today and repeat BMET in 2 wks.  - CPX to assess functional capacity.  3. Active smoker: He is motivated to quit smoking.  - Will try to get Chantix for him, will see if our pharmacist can help get this for him.   Our social worker will try to help him with meds and prescription coverage.   Followup in 2 wks with me.  Loralie Champagne 01/16/2016

## 2016-01-18 ENCOUNTER — Telehealth (HOSPITAL_COMMUNITY): Payer: Self-pay | Admitting: *Deleted

## 2016-01-18 MED ORDER — ATORVASTATIN CALCIUM 40 MG PO TABS: 40.0000 mg | ORAL_TABLET | Freq: Every day | ORAL | 2 refills | Status: DC

## 2016-01-18 MED FILL — ATORVASTATIN 40 MG TABLET: 40 | 30 days supply | Qty: 30 | Fill #0

## 2016-01-18 NOTE — Telephone Encounter (Signed)
Notes Recorded by Harvie Junior, CMA on 01/18/2016 at 4:04 PM EDT Patient aware. Atorvastatin sent to outpatient pharmacy on HF fund.   ------  Notes Recorded by Harvie Junior, [place] on 01/18/2016 at 2:22 PM EDT Routed to [facility] to see if we can get financial assistance for atorvastatin ------  Notes Recorded by Larey Dresser, MD on 01/17/2016 at 4:12 PM EDT Stop lovastatin, go back to atorvastatin 40 mg daily, needs to get help through HF fund.    Ref Range & Units 3d ago 10mo ago 44yr ago   Cholesterol 0 - 200 mg/dL 175  134  172CM    Triglycerides <150 mg/dL 65  <150 mg/dL" class="[id number]"> 96  125.0R, CM    HDL >40 mg/dL 40   34   34.70R     Total CHOL/HDL Ratio RATIO 4.4  3.9  5R, CM    VLDL 0 - 40 mg/dL 13  19  25.0R    LDL Cholesterol 0 - 99 mg/dL 122   81CM  112

## 2016-01-18 NOTE — Telephone Encounter (Signed)
-----   Message from Larey Dresser, MD sent at 01/17/2016  4:12 PM EDT ----- Stop lovastatin, go back to atorvastatin 40 mg daily, needs to get help through HF fund.

## 2016-01-28 ENCOUNTER — Ambulatory Visit (HOSPITAL_COMMUNITY): Payer: Medicare Other

## 2016-01-28 ENCOUNTER — Encounter (HOSPITAL_COMMUNITY): Payer: Self-pay | Admitting: *Deleted

## 2016-02-04 ENCOUNTER — Ambulatory Visit (HOSPITAL_COMMUNITY)
Admission: RE | Admit: 2016-02-04 | Discharge: 2016-02-04 | Disposition: A | Discharge status: Home / Self Care | Payer: Medicare Other | Source: Ambulatory Visit | Attending: Cardiology | Admitting: Cardiology

## 2016-02-04 ENCOUNTER — Encounter (HOSPITAL_COMMUNITY): Payer: Self-pay

## 2016-02-04 VITALS — BP 122/74 | HR 89 | Wt 224.0 lb

## 2016-02-04 DIAGNOSIS — I25812 Atherosclerosis of bypass graft of coronary artery of transplanted heart without angina pectoris: Secondary | ICD-10-CM | POA: Diagnosis not present

## 2016-02-04 DIAGNOSIS — J449 Chronic obstructive pulmonary disease, unspecified: Secondary | ICD-10-CM | POA: Diagnosis not present

## 2016-02-04 DIAGNOSIS — F17210 Nicotine dependence, cigarettes, uncomplicated: Secondary | ICD-10-CM | POA: Diagnosis not present

## 2016-02-04 DIAGNOSIS — I255 Ischemic cardiomyopathy: Secondary | ICD-10-CM | POA: Insufficient documentation

## 2016-02-04 DIAGNOSIS — I5022 Chronic systolic (congestive) heart failure: Secondary | ICD-10-CM

## 2016-02-04 DIAGNOSIS — Z79899 Other long term (current) drug therapy: Secondary | ICD-10-CM | POA: Diagnosis not present

## 2016-02-04 DIAGNOSIS — Z951 Presence of aortocoronary bypass graft: Secondary | ICD-10-CM | POA: Insufficient documentation

## 2016-02-04 DIAGNOSIS — Z9889 Other specified postprocedural states: Secondary | ICD-10-CM | POA: Insufficient documentation

## 2016-02-04 DIAGNOSIS — Z7982 Long term (current) use of aspirin: Secondary | ICD-10-CM | POA: Diagnosis not present

## 2016-02-04 DIAGNOSIS — I2510 Atherosclerotic heart disease of native coronary artery without angina pectoris: Secondary | ICD-10-CM | POA: Diagnosis not present

## 2016-02-04 LAB — BASIC METABOLIC PANEL
ANION GAP: 8 (ref 5–15)
BUN: 16 mg/dL (ref 6–20)
CALCIUM: 9.9 mg/dL (ref 8.9–10.3)
CO2: 25 mmol/L (ref 22–32)
Chloride: 106 mmol/L (ref 101–111)
Creatinine, Ser: 1.34 mg/dL — ABNORMAL HIGH (ref 0.61–1.24)
GFR calc Af Amer: >60 mL/min (ref >60)
GFR calc non Af Amer: >60 mL/min (ref >60)
GLUCOSE: 75 mg/dL (ref 65–99)
Potassium: 4 mmol/L (ref 3.5–5.1)
Sodium: 139 mmol/L (ref 135–145)

## 2016-02-04 LAB — BRAIN NATRIURETIC PEPTIDE: B Natriuretic Peptide: 110.6 pg/mL — ABNORMAL HIGH (ref 0.0–100.0)

## 2016-02-04 MED ORDER — SPIRONOLACTONE 25 MG PO TABS: 12.5000 mg | ORAL_TABLET | Freq: Every day | ORAL | 3 refills | Status: DC

## 2016-02-04 MED FILL — SPIRONOLACTONE 25 MG TABLET: 25 | 30 days supply | Qty: 15 | Fill #0

## 2016-02-04 NOTE — Patient Instructions (Signed)
Take Spironolactone 12.5mg  (1/2 tablet) daily.  Labs today will call with ABNORMAL results. Otherwise no news is good news!  Repeat labs (bmet) on 02/15/16 with Cardiopulmonary Exercise Test (CPX).  Follow up in 1 month

## 2016-02-05 NOTE — Progress Notes (Signed)
Cardiology: Dr. Aundra Dubin  40 yo with history of CAD s/p CABG, chronic systolic CHF (ischemic cardiomyopathy), and COPD presents for CHF clinic evaluation.  He had his first MI with occlusion of LAD in 10/9 treated with BMS.  He Then had POBA of reocclusion of LAD stent in 6/10. Finally, he had sequential LIMA-D1 and D2 in 1/11.  NSTEMI in 6/17 (cocaine positive) showed 99% stenosis small distal LCx as likely culprit. This was medically managed. Echo in 6/17 showed EF 20% with wall motion abnormalities.   Patient has no chest pain but has significant exertional dyspnea.  He is short of breath walking 1 block or walking up stairs. No orthopnea/PND. He is generally fatigued.  Chronic cough. He is significantly limited by his symptoms.  Still smoking, has not yet gotten Chantix.  Management is complicated by lack of prescription insurance (has disability with Medicare but no prescription coverage). Weight is down 6 lbs with increased Lasix + Entresto.   ECG (6/17): NSR, IVCD 128 msec  Labs (6/17): K 3.8, creatinine 1.26, HCT 42.4, LDL 81, HDL 34  Labs (10/17): K 4, creatinine 1.42, LDL 122, BNP 475  PMH: 1. GERD 2. OSA 3. H/o VT 4. COPD: Active smoker. 5. Chronic systolic CHF: Ischemic cardiomyopathy. Has [employer] ICD.  - Echo (6/17): EF 20%, wall motion abnormalities, mild MR 6. CAD: Acute MI in 10/09 with BMS to LAD - POBA to LAD stent in 6/10.  - CABG with sequential LIMA-D1 and D2 in 1/11. - NSTEMI 6/17: LHC with patent sequential LIMA-D1 and D2 with filling of LAD, occlusion proximal LAD, subtotal occlusion of distal LCx into small OM (likely culprit).  No PCI. He was cocaine positive.   SH: Disabled, active smoker, history of cocaine use, no ETOH, lives in [place].  FH: Father with 1st MI in his 76s.  ROS: All systems reviewed and negative except as per HPI.   Current Outpatient Prescriptions  Medication Sig Dispense Refill  . aspirin EC 81 MG EC tablet Take 1 tablet (81 mg  total) by mouth daily.    Marland Kitchen atorvastatin (LIPITOR) 40 MG tablet Take 1 tablet (40 mg total) by mouth daily. 30 tablet 2  . carvedilol (COREG) 6.25 MG tablet Take 1 tablet (6.25 mg total) by mouth 2 (two) times daily. 180 tablet 3  . furosemide (LASIX) 40 MG tablet Take 20-40 mg by mouth as directed. Take 1 tablet in the morning and 1/2 tablet in the evening.    . isosorbide mononitrate (IMDUR) 60 MG 24 hr tablet Take 1 tablet (60 mg total) by mouth daily. 30 tablet 6  . potassium chloride SA (K-DUR,KLOR-CON) 20 MEQ tablet Take 1 tablet (20 mEq total) by mouth daily. 30 tablet 3  . sacubitril-valsartan (ENTRESTO) 24-26 MG Take 1 tablet by mouth 2 (two) times daily. 60 tablet 3  . nitroGLYCERIN (NITROSTAT) 0.4 MG SL tablet Place 1 tablet (0.4 mg total) under the tongue every 5 (five) minutes as needed for chest pain. (Patient not taking: Reported on 02/04/2016) 100 tablet 0  . spironolactone (ALDACTONE) 25 MG tablet Take 0.5 tablets (12.5 mg total) by mouth daily. 15 tablet 3  . varenicline (CHANTIX) 1 MG tablet Take 1/2 tablet daily days 1-3. Then take 1/2 tablet twice daily days 4-7.  Then take 1 tablet twice daily. (Patient not taking: Reported on 02/04/2016) 60 tablet 11   No current facility-administered medications for this encounter.    BP 122/74   Pulse 89   Wt 224  lb (101.6 kg)   SpO2 99%   BMI 28.00 kg/m  General: NAD Neck: JVP 7 cm, no thyromegaly or thyroid nodule.  Lungs: Clear to auscultation bilaterally with normal respiratory effort. CV: Nondisplaced PMI.  Heart regular S1/S2, no S3/S4, no murmur.  No peripheral edema.  No carotid bruit.  Normal pedal pulses.  Abdomen: Soft, nontender, no hepatosplenomegaly, no distention.  Skin: Intact without lesions or rashes.  Neurologic: Alert and oriented x 3.  Psych: Normal affect. Extremities: No clubbing or cyanosis.  HEENT: Normal.   Assessment/Plan: 1. CAD: s/p CABG.  6/17 NSTEMI with subtotal occlusion small distal LCx managed  medically.  No chest pain.  - Continue ASA 81.  Not on Plavix but this would be a reasonable addition, will address at next appointment (avoid adding more than 1 medication today given difficulty keeping up with his regimen).  - Continue statin. He is back on atorvastatin with high LDL on lovastatin.  Lipids/LFTs in 2 months.   2. Chronic systolic CHF: Ischemic CMP.  [place].  EF 20% on 6/17 echo.  NYHA class III symptoms but no longer volume overloaded on exam.  COPD may also play a role in his dyspnea.  - Continue Lasix 40 qam/20 qpm.   - Continue Entresto 24/26 bid.  - Continue Coreg .  - Add spironolactone 12.5 daily with BMET today and again in 10 days.   - CPX to assess functional capacity => scheduled 11/17. 3. Active smoker: He is motivated to quit smoking.  - Will see if we can help him get Chantix, discuss with our clinic pharmacist.   Followup in 1 month.  Loralie Champagne 02/05/2016

## 2016-02-15 ENCOUNTER — Ambulatory Visit (HOSPITAL_COMMUNITY)
Admission: RE | Admit: 2016-02-15 | Discharge: 2016-02-15 | Disposition: A | Discharge status: Home / Self Care | Payer: Medicare Other | Source: Ambulatory Visit | Attending: Cardiology | Admitting: Cardiology

## 2016-02-15 ENCOUNTER — Ambulatory Visit (HOSPITAL_COMMUNITY): Payer: Medicare Other

## 2016-02-15 DIAGNOSIS — I509 Heart failure, unspecified: Secondary | ICD-10-CM | POA: Insufficient documentation

## 2016-02-15 DIAGNOSIS — I5022 Chronic systolic (congestive) heart failure: Secondary | ICD-10-CM | POA: Diagnosis not present

## 2016-02-15 LAB — BASIC METABOLIC PANEL
ANION GAP: 7 (ref 5–15)
BUN: 17 mg/dL (ref 6–20)
CALCIUM: 8.8 mg/dL — AB (ref 8.9–10.3)
CHLORIDE: 107 mmol/L (ref 101–111)
CO2: 26 mmol/L (ref 22–32)
Creatinine, Ser: 1.41 mg/dL — ABNORMAL HIGH (ref 0.61–1.24)
GFR calc non Af Amer: >60 mL/min (ref >60)
GLUCOSE: 93 mg/dL (ref 65–99)
POTASSIUM: 4.2 mmol/L (ref 3.5–5.1)
Sodium: 140 mmol/L (ref 135–145)

## 2016-03-08 ENCOUNTER — Inpatient Hospital Stay (HOSPITAL_COMMUNITY)
Admission: EM | Admit: 2016-03-08 | Discharge: 2016-03-10 | DRG: 445 | Discharge status: Against Medical Advice | Payer: Medicare Other | Attending: Internal Medicine | Admitting: Internal Medicine

## 2016-03-08 ENCOUNTER — Encounter (HOSPITAL_COMMUNITY): Payer: Self-pay | Admitting: Emergency Medicine

## 2016-03-08 ENCOUNTER — Emergency Department (HOSPITAL_COMMUNITY): Payer: Medicare Other

## 2016-03-08 DIAGNOSIS — K219 Gastro-esophageal reflux disease without esophagitis: Secondary | ICD-10-CM | POA: Diagnosis not present

## 2016-03-08 DIAGNOSIS — K828 Other specified diseases of gallbladder: Principal | ICD-10-CM | POA: Diagnosis present

## 2016-03-08 DIAGNOSIS — I255 Ischemic cardiomyopathy: Secondary | ICD-10-CM | POA: Diagnosis not present

## 2016-03-08 DIAGNOSIS — Z8249 Family history of ischemic heart disease and other diseases of the circulatory system: Secondary | ICD-10-CM | POA: Diagnosis not present

## 2016-03-08 DIAGNOSIS — F17210 Nicotine dependence, cigarettes, uncomplicated: Secondary | ICD-10-CM | POA: Diagnosis not present

## 2016-03-08 DIAGNOSIS — F411 Generalized anxiety disorder: Secondary | ICD-10-CM | POA: Diagnosis present

## 2016-03-08 DIAGNOSIS — K921 Melena: Secondary | ICD-10-CM | POA: Diagnosis not present

## 2016-03-08 DIAGNOSIS — R112 Nausea with vomiting, unspecified: Secondary | ICD-10-CM | POA: Diagnosis not present

## 2016-03-08 DIAGNOSIS — N183 Chronic kidney disease, stage 3 unspecified: Secondary | ICD-10-CM | POA: Diagnosis present

## 2016-03-08 DIAGNOSIS — I5042 Chronic combined systolic (congestive) and diastolic (congestive) heart failure: Secondary | ICD-10-CM | POA: Diagnosis not present

## 2016-03-08 DIAGNOSIS — K2970 Gastritis, unspecified, without bleeding: Secondary | ICD-10-CM | POA: Diagnosis not present

## 2016-03-08 DIAGNOSIS — G4733 Obstructive sleep apnea (adult) (pediatric): Secondary | ICD-10-CM | POA: Diagnosis not present

## 2016-03-08 DIAGNOSIS — E861 Hypovolemia: Secondary | ICD-10-CM | POA: Diagnosis present

## 2016-03-08 DIAGNOSIS — I252 Old myocardial infarction: Secondary | ICD-10-CM

## 2016-03-08 DIAGNOSIS — Z951 Presence of aortocoronary bypass graft: Secondary | ICD-10-CM

## 2016-03-08 DIAGNOSIS — N289 Disorder of kidney and ureter, unspecified: Secondary | ICD-10-CM

## 2016-03-08 DIAGNOSIS — Z7982 Long term (current) use of aspirin: Secondary | ICD-10-CM

## 2016-03-08 DIAGNOSIS — N1831 Chronic kidney disease, stage 3a: Secondary | ICD-10-CM | POA: Diagnosis present

## 2016-03-08 DIAGNOSIS — R1110 Vomiting, unspecified: Secondary | ICD-10-CM | POA: Diagnosis not present

## 2016-03-08 DIAGNOSIS — Z95810 Presence of automatic (implantable) cardiac defibrillator: Secondary | ICD-10-CM | POA: Diagnosis not present

## 2016-03-08 DIAGNOSIS — R1010 Upper abdominal pain, unspecified: Secondary | ICD-10-CM | POA: Diagnosis present

## 2016-03-08 DIAGNOSIS — E7800 Pure hypercholesterolemia, unspecified: Secondary | ICD-10-CM | POA: Diagnosis present

## 2016-03-08 DIAGNOSIS — D135 Benign neoplasm of extrahepatic bile ducts: Secondary | ICD-10-CM | POA: Diagnosis present

## 2016-03-08 DIAGNOSIS — R1011 Right upper quadrant pain: Secondary | ICD-10-CM | POA: Diagnosis present

## 2016-03-08 DIAGNOSIS — Z5321 Procedure and treatment not carried out due to patient leaving prior to being seen by health care provider: Secondary | ICD-10-CM | POA: Diagnosis not present

## 2016-03-08 DIAGNOSIS — I2510 Atherosclerotic heart disease of native coronary artery without angina pectoris: Secondary | ICD-10-CM | POA: Diagnosis not present

## 2016-03-08 DIAGNOSIS — Z955 Presence of coronary angioplasty implant and graft: Secondary | ICD-10-CM | POA: Diagnosis not present

## 2016-03-08 DIAGNOSIS — R740 Nonspecific elevation of levels of transaminase and lactic acid dehydrogenase [LDH]: Secondary | ICD-10-CM | POA: Diagnosis present

## 2016-03-08 DIAGNOSIS — R7401 Elevation of levels of liver transaminase levels: Secondary | ICD-10-CM

## 2016-03-08 DIAGNOSIS — G249 Dystonia, unspecified: Secondary | ICD-10-CM | POA: Diagnosis not present

## 2016-03-08 DIAGNOSIS — Z79899 Other long term (current) drug therapy: Secondary | ICD-10-CM | POA: Diagnosis not present

## 2016-03-08 LAB — CBC WITH DIFFERENTIAL/PLATELET
BASOS PCT: 0 %
Basophils Absolute: 0.1 10*3/uL (ref 0.0–0.1)
EOS ABS: 0.2 10*3/uL (ref 0.0–0.7)
Eosinophils Relative: 2 %
HEMATOCRIT: 45.4 % (ref 39.0–52.0)
HEMOGLOBIN: 14.9 g/dL (ref 13.0–17.0)
LYMPHS ABS: 1.8 10*3/uL (ref 0.7–4.0)
Lymphocytes Relative: 12 %
MCH: 30 pg (ref 26.0–34.0)
MCHC: 32.8 g/dL (ref 30.0–36.0)
MCV: 91.3 fL (ref 78.0–100.0)
MONOS PCT: 7 %
Monocytes Absolute: 1.1 10*3/uL — ABNORMAL HIGH (ref 0.1–1.0)
NEUTROS ABS: 12 10*3/uL — AB (ref 1.7–7.7)
NEUTROS PCT: 79 %
Platelets: 172 10*3/uL (ref 150–400)
RBC: 4.97 MIL/uL (ref 4.22–5.81)
RDW: 13.6 % (ref 11.5–15.5)
WBC: 15.2 10*3/uL — AB (ref 4.0–10.5)

## 2016-03-08 LAB — COMPREHENSIVE METABOLIC PANEL
ALBUMIN: 3.5 g/dL (ref 3.5–5.0)
ALT: 67 U/L — ABNORMAL HIGH (ref 17–63)
ANION GAP: 8 (ref 5–15)
AST: 31 U/L (ref 15–41)
Alkaline Phosphatase: 70 U/L (ref 38–126)
BUN: 13 mg/dL (ref 6–20)
CO2: 23 mmol/L (ref 22–32)
Calcium: 9.1 mg/dL (ref 8.9–10.3)
Chloride: 105 mmol/L (ref 101–111)
Creatinine, Ser: 1.33 mg/dL — ABNORMAL HIGH (ref 0.61–1.24)
GFR calc non Af Amer: >60 mL/min (ref >60)
GLUCOSE: 131 mg/dL — AB (ref 65–99)
POTASSIUM: 3.9 mmol/L (ref 3.5–5.1)
SODIUM: 136 mmol/L (ref 135–145)
Total Bilirubin: 0.6 mg/dL (ref 0.3–1.2)
Total Protein: 6.5 g/dL (ref 6.5–8.1)

## 2016-03-08 LAB — TROPONIN I

## 2016-03-08 LAB — LIPASE, BLOOD: Lipase: 21 U/L (ref 11–51)

## 2016-03-08 MED ORDER — ONDANSETRON HCL 4 MG/2ML IJ SOLN
4.0000 mg | Freq: Once | INTRAMUSCULAR | Status: AC
  Administered 2016-03-08: 4 mg via INTRAVENOUS
  Filled 2016-03-08: qty 2

## 2016-03-08 MED ORDER — MORPHINE SULFATE (PF) 4 MG/ML IV SOLN
4.0000 mg | Freq: Once | INTRAVENOUS | Status: AC
  Administered 2016-03-08: 4 mg via INTRAVENOUS
  Filled 2016-03-08: qty 1

## 2016-03-08 NOTE — ED Triage Notes (Signed)
Pt arrived via [facility] from home. Pt states he has been experiencing RUQ pain for "over a week." States "it's gotten worse today, I can't stand it." Pt reports nausea/ dry heaving; denies v/d. Pt received 250 fentanyl, 4 mg zofran, and 300 cc NS with EMS. Pt now reports pain decreased from 10/10 to 8/10. Pt reports PMH of 6 MI, with the last one being "6 mos ago." Pt denies SOB; resp e/u; NAD noted at this time.

## 2016-03-08 NOTE — ED Provider Notes (Signed)
[facility] DEPT Provider Note   CSN: [id number] Arrival date & time: 03/08/16  2239     History   Chief Complaint Chief Complaint  Patient presents with  . Abdominal Pain    HPI John Davenport is a 40 y.o. male.  He had onset at about 3 PM of severe right upper quadrant pain without radiation. Pain started after eating some [employer]. There is associated nausea and vomiting. He denies fever or chills but has broken out in sweats. Pain is sharp. He rates the pain at 8/10. Nothing makes it worse. There is temporary improvement after vomiting. He has never had similar pain before. He is status post heart transplant and status post very artery bypass of the transplanted heart. He has not had any abdominal surgery. He states this is very different from any pain he has had with his heart.   The history is provided by the patient.    Past Medical History:  Diagnosis Date  . Anxiety state, unspecified   . Back pain   . Chronic systolic heart failure ([place])    a. Ischemic CM (EF 30% by echo in 2011) b. echo 08/2015: EF 20% with severe HK and AK of the inferoseptal and apical myocardium.  . Coronary artery disease    a. 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  b. eventually required single vessel CABG (L-LAD) in 03/2009 due to restenosis. c. cath 2013 showed patent LIMA sequential to the diags with LAD filling retrograde, no new disease in RCA and Cx, low LVEDP, sx felt noncardiac. d. 08/2015: NSTEMI: CTO of prox LAD, 99% stenosis of small dCx (no stent), + for cocaine  . Esophageal reflux   . Heart attack   . Hypercholesteremia   . ICD (implantable cardioverter-defibrillator) infection ([place])    a. [employer] ICD 12/2008.   . Ischemic cardiomyopathy    echocardiogram 12/11: Mild LVH, EF 30-35%, anteroseptal and apical akinesis, grade 1 diastolic dysfunction  . LBP (low back pain)   . OSA (obstructive sleep apnea) 12/30/2010  . Paroxysmal  ventricular tachycardia ([place])   . Tobacco abuse     Patient Active Problem List   Diagnosis Date Noted  . NSTEMI (non-ST elevated myocardial infarction) ([place]) 09/05/2015  . Ischemic cardiomyopathy 09/05/2015  . Tobacco abuse 09/05/2015  . Hyperlipidemia 09/05/2015  . Renal insufficiency 09/05/2015  . OSA (obstructive sleep apnea) 12/30/2010  . Cough 12/09/2010  . Dental abscess 12/09/2010  . GERD 04/10/2010  . PAROXYSMAL VENTRICULAR TACHYCARDIA 03/22/2010  . Chronic systolic heart failure ([place]) 03/22/2010  . ANXIETY 05/16/2009  . MYOCARDIAL INFARCTION 12/19/2008  . CAD in native artery 12/19/2008    Past Surgical History:  Procedure Laterality Date  . CARDIAC CATHETERIZATION N/A 09/06/2015   Procedure: Left Heart Cath and Cors/Grafts Angiography;  Surgeon: Sherren Mocha, MD;  Location: [facility];  Service: Cardiovascular;  Laterality: N/A;  . CARDIAC DEFIBRILLATOR PLACEMENT    . CARDIAC DEFIBRILLATOR PLACEMENT     2010 by JA  . CORONARY ANGIOPLASTY WITH STENT PLACEMENT     LAD stenting 10/09 and re-opening 6/10  . CORONARY ARTERY BYPASS GRAFT     1/11  . LEFT HEART CATHETERIZATION WITH CORONARY ANGIOGRAM N/A 08/07/2011   Procedure: LEFT HEART CATHETERIZATION WITH CORONARY ANGIOGRAM;  Surgeon: Josue Hector, MD;  Location: [facility] CATH [facility];  Service: Cardiovascular;  Laterality: N/A;       Home Medications    Prior to  Admission medications   Medication Sig Start Date End Date Taking? Authorizing Provider  aspirin EC 81 MG EC tablet Take 1 tablet (81 mg total) by mouth daily. 09/07/15   Erma Heritage, PA  atorvastatin (LIPITOR) 40 MG tablet Take 1 tablet (40 mg total) by mouth daily. 01/18/16   Larey Dresser, MD  carvedilol (COREG) 6.25 MG tablet Take 1 tablet (6.25 mg total) by mouth 2 (two) times daily. 12/26/15   Thompson Grayer, MD  furosemide (LASIX) 40 MG tablet Take 20-40 mg by mouth as directed. Take 1 tablet in the morning and 1/2 tablet in the evening.     Historical Provider, MD  isosorbide mononitrate (IMDUR) 60 MG 24 hr tablet Take 1 tablet (60 mg total) by mouth daily. 09/24/15 09/23/16  Brittainy M Simmons, PA-C  nitroGLYCERIN (NITROSTAT) 0.4 MG SL tablet Place 1 tablet (0.4 mg total) under the tongue every 5 (five) minutes as needed for chest pain. Patient not taking: Reported on 02/04/2016 09/24/15   Brittainy M Rosita Fire, PA-C  potassium chloride SA (K-DUR,KLOR-CON) 20 MEQ tablet Take 1 tablet (20 mEq total) by mouth daily. 01/15/16   Larey Dresser, MD  sacubitril-valsartan (ENTRESTO) 24-26 MG Take 1 tablet by mouth 2 (two) times daily. 01/15/16   Larey Dresser, MD  spironolactone (ALDACTONE) 25 MG tablet Take 0.5 tablets (12.5 mg total) by mouth daily. 02/04/16 05/04/16  Larey Dresser, MD  varenicline (CHANTIX) 1 MG tablet Take 1/2 tablet daily days 1-3. Then take 1/2 tablet twice daily days 4-7.  Then take 1 tablet twice daily. Patient not taking: Reported on 02/04/2016 01/15/16   Larey Dresser, MD    Family History Family History  Problem Relation Age of Onset  . Heart attack Father 44    died at 53 after multiple MI's  . COPD Maternal Grandmother     Social History Social History  Substance Use Topics  . Smoking status: Current Every Day Smoker    Packs/day: 1.00    Years: 17.00    Types: Cigarettes  . Smokeless tobacco: Not on file  . Alcohol use No     Allergies   Patient has no active allergies.   Review of Systems Review of Systems  All other systems reviewed and are negative.    Physical Exam Updated Vital Signs BP 121/77   Pulse 75   Resp 23   Ht 6\' 3"  (1.905 m)   Wt 230 lb (104.3 kg)   SpO2 96%   BMI 28.75 kg/m   Physical Exam  Nursing note and vitals reviewed.  40 year old male, resting comfortably and in no acute distress. Vital signs are normal. Oxygen saturation is 100%, which is normal. Head is normocephalic and atraumatic. PERRLA, EOMI. Oropharynx is clear. Neck is nontender and supple  without adenopathy or JVD. Back is nontender and there is no CVA tenderness. Lungs are clear without rales, wheezes, or rhonchi. Chest is nontender. Heart has regular rate and rhythm without murmur. Abdomen is soft, flat, with moderate right upper quadrant tenderness. There is plus/minus Murphy sign. There are no masses or hepatosplenomegaly and peristalsis is hypoactive. Extremities have no cyanosis or edema, full range of motion is present. Skin is warm and dry without rash. Neurologic: Mental status is normal, cranial nerves are intact, there are no motor or sensory deficits.   ED Treatments / Results  Labs (all labs ordered are listed, but only abnormal results are displayed) Labs Reviewed  COMPREHENSIVE METABOLIC PANEL -  Abnormal; Notable for the following:       Result Value   Glucose, Bld 131 (*)    Creatinine, Ser 1.33 (*)    ALT 67 (*)    All other components within normal limits  CBC WITH DIFFERENTIAL/PLATELET - Abnormal; Notable for the following:    WBC 15.2 (*)    Neutro Abs 12.0 (*)    Monocytes Absolute 1.1 (*)    All other components within normal limits  LIPASE, BLOOD  TROPONIN I    EKG  EKG Interpretation  Date/Time:  Saturday March 08 2016 22:46:38 EST Ventricular Rate:  80 PR Interval:    QRS Duration: 139 QT Interval:  361 QTC Calculation: 417 R Axis:   123 Text Interpretation:  Sinus rhythm Ventricular premature complex Biatrial enlargement IVCD, consider atypical RBBB ST elevation, consider anterolateral injury When compared with ECG of 03/08/2016, ST elevation in Anterior leads was present previously T wave inversion Anterolateral leads has improved Confirmed by [facility]  MD, Shavonta Gossen ([id number]) on 03/08/2016 11:08:57 PM       Radiology US Abdomen Complete  Result Date: 03/08/2016 CLINICAL DATA:  Right upper quadrant pain, nausea and vomiting for 2 weeks EXAM: ABDOMEN ULTRASOUND COMPLETE COMPARISON:  None. FINDINGS: Gallbladder: There is mild  gallbladder wall thickening, 4.4 mm. There is comet tail artifact in the gallbladder wall, consistent with adenomyomatosis. No calculi are evident. The patient was not tender to probe pressure over the gallbladder. Common bile duct: Diameter: 3.5 mm Liver: Mildly coarsened hepatic parenchymal echotexture without discrete lesion. IVC: No abnormality visualized. Pancreas: Not well seen. Spleen: Size and appearance within normal limits. Right Kidney: Length: 11.1 cm. Echogenicity within normal limits. No mass or hydronephrosis visualized. Left Kidney: Length: 11.9 cm. Echogenicity within normal limits. No mass or hydronephrosis visualized. Abdominal aorta: No aneurysm visualized. Other findings: None. IMPRESSION: Mild gallbladder wall thickening and probable adenomyomatosis. No tenderness over the gallbladder. No calculi. Mildly coarsened hepatic parenchymal echotexture could represent fatty infiltration. Electronically Signed   By: Andreas Newport M.D.   On: 03/08/2016 23:56   Ct Abdomen Pelvis W Contrast  Result Date: 03/09/2016 CLINICAL DATA:  Initial evaluation for acute epigastric pain with nausea and vomiting. EXAM: CT ABDOMEN AND PELVIS WITH CONTRAST TECHNIQUE: Multidetector CT imaging of the abdomen and pelvis was performed using the standard protocol following bolus administration of intravenous contrast. CONTRAST:  1 ISOVUE-300 IOPAMIDOL (ISOVUE-300) INJECTION 61% COMPARISON:  Prior ultrasound from earlier the same day. FINDINGS: Lower chest: Mild atelectasis present dependently within the visualized lung bases. Visualized lungs are otherwise clear. Cardiac pacemaker electrodes and median sternotomy wires partially visualized. Mild cardiomegaly. Hepatobiliary: Liver demonstrates a normal contrast enhanced appearance. Mild mucosal enhancement with hazy inflammatory stranding about the gallbladder, which could reflect sequela of acute cholecystitis. No radiopaque calculi identified. Common bile duct of  normal caliber with no biliary dilatation identified. The small gas-filled duodenum diverticulum suspected near the sphincterof Oddi (series 201, image 42). Pancreas: Pancreas within normal limits. No pancreatic ductal dilatation. Spleen: Spleen within normal limits. Adrenals/Urinary Tract: Adrenal glands are normal. Kidneys equal in size with symmetric enhancement. Scattered cortical scarring noted within the left kidney. No nephrolithiasis or hydronephrosis. No focal enhancing renal mass. Ureters of normal caliber without acute abnormality. Bladder within normal limits. Stomach/Bowel: Stomach within normal limits. No evidence for bowel obstruction. Appendix is within normal limits. Mild diffuse wall thickening about the descending and sigmoid colon felt to be related incomplete distension. No acute inflammatory changes seen about the bowels. Vascular/Lymphatic:  Normal intravascular enhancement seen throughout the intra-abdominal aorta and its branch vessels. No adenopathy. Reproductive: Prostate normal. Other: No free air or fluid. Musculoskeletal: No acute osseous abnormality. No worrisome lytic or blastic osseous lesions. IMPRESSION: 1. Mucosal enhancement with hazy inflammatory stranding about the gallbladder, suspicious for possible acute cholecystitis. Correlation with laboratory values and symptomatology recommended. No radiopaque calculi or overt biliary dilatation identified. 2. No other acute intra- abdominal or pelvic process identified. Electronically Signed   By: Jeannine Boga M.D.   On: 03/09/2016 01:33    Procedures Procedures (including critical care time)  Medications Ordered in ED Medications  ondansetron (ZOFRAN) injection 4 mg (4 mg Intravenous Given 03/08/16 2311)  morphine 4 MG/ML injection 4 mg (4 mg Intravenous Given 03/08/16 2313)  morphine 4 MG/ML injection 4 mg (4 mg Intravenous Given 03/09/16 0054)  iopamidol (ISOVUE-300) 61 % injection (  Canceled Entry 03/09/16 0149)    morphine 4 MG/ML injection 4 mg (4 mg Intravenous Given 03/09/16 0157)     Initial Impression / Assessment and Plan / ED Course  I have reviewed the triage vital signs and the nursing notes.  Pertinent labs & imaging results that were available during my care of the patient were reviewed by me and considered in my medical decision making (see chart for details).  Clinical Course    Right upper quadrant pain suspicious for biliary colic. Consider atypical cardiac pain, diverticulitis, pancreatitis, peptic ulcer disease. Old records are reviewed confirming history of cardiomyopathy and coronary artery disease involving bypass grafts of transplanted heart. I see no prior abdominal imaging. He will be sent for abdominal ultrasound. Screening labs will be obtained including troponin, and ECG will be checked.  ECG is unremarkable. After workup shows mild elevation of ALT and stable elevation of creatinine. WBC is elevated but without significant left shift. He did have initial relief of pain with morphine, but states pain recurred when he went for ultrasound. Ultrasound showed thickened gallbladder wall and probable adenomyomatosis. He probably will need HIDA scan at some point to evaluate his gallbladder. On reexam, he continues to appear uncomfortable and continues to have epigastric and right upper quadrant tenderness. He will be sent for CT of abdomen and pelvis.  CT does show inflammatory changes around the gallbladder. He had only mild relief of second dose of morphine. He is given additional morphine. He has no PCP, so would be difficult to set up HIDA scan as an outpatient. Case will be discussed with hospitalist to consider admission for pain control and obtaining HIDA scan.  Dr. Myna Hidalgo agrees to hospital admission.  Final Clinical Impressions(s) / ED Diagnoses   Final diagnoses:  Upper abdominal pain  Renal insufficiency  Elevated transaminase level  Thickening of wall of gallbladder     New Prescriptions New Prescriptions   No medications on file     Delora Fuel, MD [id number]

## 2016-03-08 NOTE — ED Notes (Signed)
ED Provider at bedside. 

## 2016-03-09 ENCOUNTER — Encounter (HOSPITAL_COMMUNITY): Payer: Self-pay | Admitting: Radiology

## 2016-03-09 ENCOUNTER — Inpatient Hospital Stay (HOSPITAL_COMMUNITY): Payer: Medicare Other

## 2016-03-09 ENCOUNTER — Emergency Department (HOSPITAL_COMMUNITY): Payer: Medicare Other

## 2016-03-09 DIAGNOSIS — I2510 Atherosclerotic heart disease of native coronary artery without angina pectoris: Secondary | ICD-10-CM

## 2016-03-09 DIAGNOSIS — N183 Chronic kidney disease, stage 3 unspecified: Secondary | ICD-10-CM | POA: Diagnosis present

## 2016-03-09 DIAGNOSIS — I5042 Chronic combined systolic (congestive) and diastolic (congestive) heart failure: Secondary | ICD-10-CM | POA: Diagnosis present

## 2016-03-09 DIAGNOSIS — F17210 Nicotine dependence, cigarettes, uncomplicated: Secondary | ICD-10-CM | POA: Diagnosis present

## 2016-03-09 DIAGNOSIS — I252 Old myocardial infarction: Secondary | ICD-10-CM | POA: Diagnosis not present

## 2016-03-09 DIAGNOSIS — R1013 Epigastric pain: Secondary | ICD-10-CM | POA: Diagnosis not present

## 2016-03-09 DIAGNOSIS — N1831 Chronic kidney disease, stage 3a: Secondary | ICD-10-CM | POA: Diagnosis present

## 2016-03-09 DIAGNOSIS — Z7982 Long term (current) use of aspirin: Secondary | ICD-10-CM | POA: Diagnosis not present

## 2016-03-09 DIAGNOSIS — I255 Ischemic cardiomyopathy: Secondary | ICD-10-CM | POA: Diagnosis present

## 2016-03-09 DIAGNOSIS — G4733 Obstructive sleep apnea (adult) (pediatric): Secondary | ICD-10-CM | POA: Diagnosis present

## 2016-03-09 DIAGNOSIS — R1010 Upper abdominal pain, unspecified: Secondary | ICD-10-CM | POA: Diagnosis not present

## 2016-03-09 DIAGNOSIS — G249 Dystonia, unspecified: Secondary | ICD-10-CM | POA: Diagnosis present

## 2016-03-09 DIAGNOSIS — R1110 Vomiting, unspecified: Secondary | ICD-10-CM | POA: Diagnosis not present

## 2016-03-09 DIAGNOSIS — Z5321 Procedure and treatment not carried out due to patient leaving prior to being seen by health care provider: Secondary | ICD-10-CM | POA: Diagnosis not present

## 2016-03-09 DIAGNOSIS — Z79899 Other long term (current) drug therapy: Secondary | ICD-10-CM | POA: Diagnosis not present

## 2016-03-09 DIAGNOSIS — E861 Hypovolemia: Secondary | ICD-10-CM | POA: Diagnosis present

## 2016-03-09 DIAGNOSIS — K219 Gastro-esophageal reflux disease without esophagitis: Secondary | ICD-10-CM | POA: Diagnosis present

## 2016-03-09 DIAGNOSIS — E7800 Pure hypercholesterolemia, unspecified: Secondary | ICD-10-CM | POA: Diagnosis present

## 2016-03-09 DIAGNOSIS — K921 Melena: Secondary | ICD-10-CM | POA: Diagnosis present

## 2016-03-09 DIAGNOSIS — R1011 Right upper quadrant pain: Secondary | ICD-10-CM | POA: Diagnosis not present

## 2016-03-09 DIAGNOSIS — Z951 Presence of aortocoronary bypass graft: Secondary | ICD-10-CM | POA: Diagnosis not present

## 2016-03-09 DIAGNOSIS — D135 Benign neoplasm of extrahepatic bile ducts: Secondary | ICD-10-CM | POA: Diagnosis present

## 2016-03-09 DIAGNOSIS — Z955 Presence of coronary angioplasty implant and graft: Secondary | ICD-10-CM | POA: Diagnosis not present

## 2016-03-09 DIAGNOSIS — Z95810 Presence of automatic (implantable) cardiac defibrillator: Secondary | ICD-10-CM | POA: Diagnosis not present

## 2016-03-09 DIAGNOSIS — K828 Other specified diseases of gallbladder: Secondary | ICD-10-CM | POA: Diagnosis present

## 2016-03-09 DIAGNOSIS — F411 Generalized anxiety disorder: Secondary | ICD-10-CM | POA: Diagnosis present

## 2016-03-09 DIAGNOSIS — R740 Nonspecific elevation of levels of transaminase and lactic acid dehydrogenase [LDH]: Secondary | ICD-10-CM | POA: Diagnosis present

## 2016-03-09 DIAGNOSIS — Z8249 Family history of ischemic heart disease and other diseases of the circulatory system: Secondary | ICD-10-CM | POA: Diagnosis not present

## 2016-03-09 LAB — COMPREHENSIVE METABOLIC PANEL
ALBUMIN: 3.4 g/dL — AB (ref 3.5–5.0)
ALT: 92 U/L — ABNORMAL HIGH (ref 17–63)
ANION GAP: 8 (ref 5–15)
AST: 78 U/L — ABNORMAL HIGH (ref 15–41)
Alkaline Phosphatase: 78 U/L (ref 38–126)
BUN: 12 mg/dL (ref 6–20)
CO2: 25 mmol/L (ref 22–32)
Calcium: 9.1 mg/dL (ref 8.9–10.3)
Chloride: 105 mmol/L (ref 101–111)
Creatinine, Ser: 1.31 mg/dL — ABNORMAL HIGH (ref 0.61–1.24)
GFR calc Af Amer: >60 mL/min (ref >60)
GFR calc non Af Amer: >60 mL/min (ref >60)
GLUCOSE: 108 mg/dL — AB (ref 65–99)
POTASSIUM: 4.6 mmol/L (ref 3.5–5.1)
SODIUM: 138 mmol/L (ref 135–145)
Total Bilirubin: 0.5 mg/dL (ref 0.3–1.2)
Total Protein: 6.3 g/dL — ABNORMAL LOW (ref 6.5–8.1)

## 2016-03-09 LAB — GLUCOSE, CAPILLARY: Glucose-Capillary: 93 mg/dL (ref 65–99)

## 2016-03-09 MED ORDER — SODIUM CHLORIDE 0.9% FLUSH
3.0000 mL | Freq: Two times a day (BID) | INTRAVENOUS | Status: DC
  Administered 2016-03-09: 3 mL via INTRAVENOUS

## 2016-03-09 MED ORDER — IOPAMIDOL (ISOVUE-300) INJECTION 61%
INTRAVENOUS | Status: AC
  Administered 2016-03-09: 100 mL
  Filled 2016-03-09: qty 100

## 2016-03-09 MED ORDER — ONDANSETRON HCL 4 MG/2ML IJ SOLN
4.0000 mg | Freq: Four times a day (QID) | INTRAMUSCULAR | Status: DC | PRN
  Administered 2016-03-09: 4 mg via INTRAVENOUS
  Filled 2016-03-09: qty 2

## 2016-03-09 MED ORDER — FAMOTIDINE IN NACL 20-0.9 MG/50ML-% IV SOLN
20.0000 mg | Freq: Two times a day (BID) | INTRAVENOUS | Status: DC
  Administered 2016-03-09 – 2016-03-10 (×4): 20 mg via INTRAVENOUS
  Filled 2016-03-09 (×5): qty 50

## 2016-03-09 MED ORDER — ENOXAPARIN SODIUM 40 MG/0.4ML ~~LOC~~ SOLN
40.0000 mg | SUBCUTANEOUS | Status: DC
  Administered 2016-03-09: 40 mg via SUBCUTANEOUS
  Filled 2016-03-09: qty 0.4

## 2016-03-09 MED ORDER — FENTANYL CITRATE (PF) 100 MCG/2ML IJ SOLN
50.0000 ug | INTRAMUSCULAR | Status: DC | PRN
  Administered 2016-03-09: 100 ug via INTRAVENOUS
  Administered 2016-03-09: 50 ug via INTRAVENOUS
  Filled 2016-03-09 (×3): qty 2

## 2016-03-09 MED ORDER — NICOTINE POLACRILEX 2 MG MT GUM
2.0000 mg | CHEWING_GUM | OROMUCOSAL | Status: DC | PRN
  Filled 2016-03-09: qty 1

## 2016-03-09 MED ORDER — ONDANSETRON HCL 4 MG PO TABS: 4.0000 mg | ORAL_TABLET | Freq: Four times a day (QID) | ORAL | Status: DC | PRN

## 2016-03-09 MED ORDER — ISOSORBIDE MONONITRATE ER 30 MG PO TB24: 60.0000 mg | ORAL_TABLET | Freq: Every day | ORAL | Status: DC

## 2016-03-09 MED ORDER — MORPHINE SULFATE (PF) 4 MG/ML IV SOLN
4.0000 mg | Freq: Once | INTRAVENOUS | Status: DC
  Filled 2016-03-09: qty 1

## 2016-03-09 MED ORDER — TECHNETIUM TC 99M MEBROFENIN IV KIT
5.2100 | PACK | Freq: Once | INTRAVENOUS | Status: AC | PRN
  Administered 2016-03-09: 5.21 via INTRAVENOUS

## 2016-03-09 MED ORDER — CARVEDILOL 12.5 MG PO TABS
6.2500 mg | ORAL_TABLET | Freq: Two times a day (BID) | ORAL | Status: DC
  Administered 2016-03-09: 6.25 mg via ORAL
  Filled 2016-03-09 (×2): qty 1

## 2016-03-09 MED ORDER — SODIUM CHLORIDE 0.9% FLUSH: 3.0000 mL | INTRAVENOUS | Status: DC | PRN

## 2016-03-09 MED ORDER — ATORVASTATIN CALCIUM 40 MG PO TABS: 40.0000 mg | ORAL_TABLET | Freq: Every day | ORAL | Status: DC

## 2016-03-09 MED ORDER — MORPHINE SULFATE (PF) 4 MG/ML IV SOLN
4.0000 mg | Freq: Once | INTRAVENOUS | Status: AC
  Administered 2016-03-09: 4 mg via INTRAVENOUS
  Filled 2016-03-09: qty 1

## 2016-03-09 MED ORDER — DEXTROSE-NACL 5-0.9 % IV SOLN
INTRAVENOUS | Status: DC
  Administered 2016-03-09: 1000 mL via INTRAVENOUS

## 2016-03-09 MED ORDER — ASPIRIN EC 81 MG PO TBEC: 81.0000 mg | DELAYED_RELEASE_TABLET | Freq: Every day | ORAL | Status: DC

## 2016-03-09 MED ORDER — HYDROCODONE-ACETAMINOPHEN 5-325 MG PO TABS
1.0000 | ORAL_TABLET | ORAL | Status: DC | PRN
  Administered 2016-03-09 – 2016-03-10 (×3): 2 via ORAL
  Filled 2016-03-09 (×3): qty 2

## 2016-03-09 MED ORDER — ORAL CARE MOUTH RINSE: 15.0000 mL | Freq: Two times a day (BID) | OROMUCOSAL | Status: DC

## 2016-03-09 MED ORDER — CHLORHEXIDINE GLUCONATE 0.12 % MT SOLN
15.0000 mL | Freq: Two times a day (BID) | OROMUCOSAL | Status: DC
  Filled 2016-03-09: qty 15

## 2016-03-09 MED ORDER — SACUBITRIL-VALSARTAN 24-26 MG PO TABS
1.0000 | ORAL_TABLET | Freq: Two times a day (BID) | ORAL | Status: DC
  Filled 2016-03-09 (×3): qty 1

## 2016-03-09 MED ORDER — ACETAMINOPHEN 325 MG PO TABS: 650.0000 mg | ORAL_TABLET | Freq: Four times a day (QID) | ORAL | Status: DC | PRN

## 2016-03-09 MED ORDER — ACETAMINOPHEN 650 MG RE SUPP: 650.0000 mg | Freq: Four times a day (QID) | RECTAL | Status: DC | PRN

## 2016-03-09 MED ORDER — SODIUM CHLORIDE 0.9 % IV SOLN: 250.0000 mL | INTRAVENOUS | Status: DC | PRN

## 2016-03-09 NOTE — Progress Notes (Signed)
New Admission Note:  Arrival Method: Stretcher from [facility] ED Mental Orientation: A&O x4 Telemetry: N/A Assessment: Completed Skin: Assessed with Percell Boston, RN, no skin issues noted IV: Left AC, saline locked Safety Measures: Safety Fall Prevention Plan discussed with patient. Admission: Completed [address] Orientation: Patient has been orientated to the room, unit and the staff. Family: none at bedside  Orders have been reviewed and implemented. Will continue to monitor the patient. Call light has been placed within reach.  Nena Polio BSN, RN  Phone Number: [phone]

## 2016-03-09 NOTE — Consult Note (Signed)
Reason for Consult: epigastric pain Referring Physician: Riccardo Dubin Arrien  John Davenport is an 40 y.o. male.  HPI: 40 yo male with severe ischemic cardiomyopathy with multiple MI's in the past presents with 3 weeks of epigastric pain. Pain is described as postprandial last multiple hours and for the last 3 days has been near constant. He previous had vomited with relief. He denies nausea at this time. He has noted blood in his stools for the last 3 weeks as well. He smokes 1 ppd which is down from previous 2-3 ppd.  Past Medical History:  Diagnosis Date  . Anxiety state, unspecified   . Back pain   . Chronic systolic heart failure ([place])    a. Ischemic CM (EF 30% by echo in 2011) b. echo 08/2015: EF 20% with severe HK and AK of the inferoseptal and apical myocardium.  . Coronary artery disease    a. 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  b. eventually required single vessel CABG (L-LAD) in 03/2009 due to restenosis. c. cath 2013 showed patent LIMA sequential to the diags with LAD filling retrograde, no new disease in RCA and Cx, low LVEDP, sx felt noncardiac. d. 08/2015: NSTEMI: CTO of prox LAD, 99% stenosis of small dCx (no stent), + for cocaine  . Esophageal reflux   . Heart attack   . Hypercholesteremia   . 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 [place] [employer] ICD 12/2008.   . Ischemic cardiomyopathy    echocardiogram 12/11: Mild LVH, EF 30-35%, anteroseptal and apical akinesis, grade 1 diastolic dysfunction  . LBP (low back pain)   . OSA (obstructive sleep apnea) 12/30/2010  . Paroxysmal ventricular tachycardia ([place])   . Tobacco abuse     Past Surgical History:  Procedure Laterality Date  . CARDIAC CATHETERIZATION N/A 09/06/2015   Procedure: Left Heart Cath and Cors/Grafts Angiography;  Surgeon: Sherren Mocha, MD;  Location: [facility];  Service: Cardiovascular;  Laterality: N/A;  . CARDIAC DEFIBRILLATOR  PLACEMENT    . CARDIAC DEFIBRILLATOR PLACEMENT     2010 by JA  . CORONARY ANGIOPLASTY WITH STENT PLACEMENT     LAD stenting 10/09 and re-opening 6/10  . CORONARY ARTERY BYPASS GRAFT     1/11  . LEFT HEART CATHETERIZATION WITH CORONARY ANGIOGRAM N/A 08/07/2011   Procedure: LEFT HEART CATHETERIZATION WITH CORONARY ANGIOGRAM;  Surgeon: Josue Hector, MD;  Location: [facility];  Service: Cardiovascular;  Laterality: N/A;    Family History  Problem Relation Age of Onset  . Heart attack Father 27    died at 1 after multiple MI's  . COPD Maternal Grandmother     Social History:  reports that he has been smoking Cigarettes.  He has a 17.00 pack-year smoking history. He has quit using smokeless tobacco. His smokeless tobacco use included Chew. He reports that he does not drink alcohol or use drugs.  Allergies: No Known Allergies  Medications: I have reviewed the patient's current medications.  Results for orders placed or performed during the hospital encounter of 03/08/16 (from the past 48 hour(s))  Comprehensive metabolic panel     Status: Abnormal   Collection Time: 03/08/16 11:07 PM  Result Value Ref Range   Sodium 136 135 - 145 mmol/L   Potassium 3.9 3.5 - 5.1 mmol/L   Chloride 105 101 - 111 mmol/L   CO2 23 22 - 32 mmol/L   Glucose, Bld 131 (H)  65 - 99 mg/dL   BUN 13 6 - 20 mg/dL   Creatinine, Ser 1.33 (H) 0.61 - 1.24 mg/dL   Calcium 9.1 8.9 - 10.3 mg/dL   Total Protein 6.5 6.5 - 8.1 g/dL   Albumin 3.5 3.5 - 5.0 g/dL   AST 31 15 - 41 U/L   ALT 67 (H) 17 - 63 U/L   Alkaline Phosphatase 70 38 - 126 U/L   Total Bilirubin 0.6 0.3 - 1.2 mg/dL   GFR calc non Af Amer >60 >60 mL/min   GFR calc Af Amer >60 >60 mL/min    Comment: (NOTE) The eGFR has been calculated using the CKD EPI equation. This calculation has not been validated in all clinical situations. eGFR's persistently <60 mL/min signify possible Chronic Kidney Disease.    Anion gap 8 5 - 15  Lipase, blood     Status:  None   Collection Time: 03/08/16 11:07 PM  Result Value Ref Range   Lipase 21 11 - 51 U/L  CBC with Differential     Status: Abnormal   Collection Time: 03/08/16 11:07 PM  Result Value Ref Range   WBC 15.2 (H) 4.0 - 10.5 K/uL   RBC 4.97 4.22 - 5.81 MIL/uL   Hemoglobin 14.9 13.0 - 17.0 g/dL   HCT 45.4 39.0 - 52.0 %   MCV 91.3 78.0 - 100.0 fL   MCH 30.0 26.0 - 34.0 pg   MCHC 32.8 30.0 - 36.0 g/dL   RDW 13.6 11.5 - 15.5 %   Platelets 172 150 - 400 K/uL   Neutrophils Relative % 79 %   Neutro Abs 12.0 (H) 1.7 - 7.7 K/uL   Lymphocytes Relative 12 %   Lymphs Abs 1.8 0.7 - 4.0 K/uL   Monocytes Relative 7 %   Monocytes Absolute 1.1 (H) 0.1 - 1.0 K/uL   Eosinophils Relative 2 %   Eosinophils Absolute 0.2 0.0 - 0.7 K/uL   Basophils Relative 0 %   Basophils Absolute 0.1 0.0 - 0.1 K/uL  Troponin I     Status: None   Collection Time: 03/08/16 11:07 PM  Result Value Ref Range   Troponin I <0.03 <0.03 ng/mL  Comprehensive metabolic panel     Status: Abnormal   Collection Time: 03/09/16  3:00 AM  Result Value Ref Range   Sodium 138 135 - 145 mmol/L   Potassium 4.6 3.5 - 5.1 mmol/L   Chloride 105 101 - 111 mmol/L   CO2 25 22 - 32 mmol/L   Glucose, Bld 108 (H) 65 - 99 mg/dL   BUN 12 6 - 20 mg/dL   Creatinine, Ser 1.31 (H) 0.61 - 1.24 mg/dL   Calcium 9.1 8.9 - 10.3 mg/dL   Total Protein 6.3 (L) 6.5 - 8.1 g/dL   Albumin 3.4 (L) 3.5 - 5.0 g/dL   AST 78 (H) 15 - 41 U/L   ALT 92 (H) 17 - 63 U/L   Alkaline Phosphatase 78 38 - 126 U/L   Total Bilirubin 0.5 0.3 - 1.2 mg/dL   GFR calc non Af Amer >60 >60 mL/min   GFR calc Af Amer >60 >60 mL/min    Comment: (NOTE) The eGFR has been calculated using the CKD EPI equation. This calculation has not been validated in all clinical situations. eGFR's persistently <60 mL/min signify possible Chronic Kidney Disease.    Anion gap 8 5 - 15  Glucose, capillary     Status: None   Collection Time: 03/09/16  7:46 AM  Result Value Ref Range    Glucose-Capillary 93 65 - 99 mg/dL    Nm Hepatobiliary Liver Func  Result Date: 03/09/2016 CLINICAL DATA:  Intractable RIGHT upper quadrant abdominal pain EXAM: NUCLEAR MEDICINE HEPATOBILIARY IMAGING TECHNIQUE: Sequential images of the abdomen were obtained out to 60 minutes following intravenous administration of radiopharmaceutical. RADIOPHARMACEUTICALS:  5.21 mCi Tc-84m Choletec IV COMPARISON:  CT abdomen and pelvis 03/09/2016 FINDINGS: Normal clearance of blood pool indicating normal hepatocellular function. Prompt excretion of tracer into biliary tree. Small bowel visualized at 16 minutes. Gallbladder visualized beginning at 60 minutes. Findings are consistent with patency of the cystic duct and CBD. IMPRESSION: Normal exam. Electronically Signed   By: MLavonia DanaM.D.   On: 03/09/2016 11:23   [place]oreaAbdomen Complete  Result Date: 03/08/2016 CLINICAL DATA:  Right upper quadrant pain, nausea and vomiting for 2 weeks EXAM: ABDOMEN ULTRASOUND COMPLETE COMPARISON:  None. FINDINGS: Gallbladder: There is mild gallbladder wall thickening, 4.4 mm. There is comet tail artifact in the gallbladder wall, consistent with adenomyomatosis. No calculi are evident. The patient was not tender to probe pressure over the gallbladder. Common bile duct: Diameter: 3.5 mm Liver: Mildly coarsened hepatic parenchymal echotexture without discrete lesion. IVC: No abnormality visualized. Pancreas: Not well seen. Spleen: Size and appearance within normal limits. Right Kidney: Length: 11.1 cm. Echogenicity within normal limits. No mass or hydronephrosis visualized. Left Kidney: Length: 11.9 cm. Echogenicity within normal limits. No mass or hydronephrosis visualized. Abdominal aorta: No aneurysm visualized. Other findings: None. IMPRESSION: Mild gallbladder wall thickening and probable adenomyomatosis. No tenderness over the gallbladder. No calculi. Mildly coarsened hepatic parenchymal echotexture could represent fatty infiltration.  Electronically Signed   By: DAndreas NewportM.D.   On: 03/08/2016 23:56   Ct Abdomen Pelvis W Contrast  Result Date: 03/09/2016 CLINICAL DATA:  Initial evaluation for acute epigastric pain with nausea and vomiting. EXAM: CT ABDOMEN AND PELVIS WITH CONTRAST TECHNIQUE: Multidetector CT imaging of the abdomen and pelvis was performed using the standard protocol following bolus administration of intravenous contrast. CONTRAST:  1 ISOVUE-300 IOPAMIDOL (ISOVUE-300) INJECTION 61% COMPARISON:  Prior ultrasound from earlier the same day. FINDINGS: Lower chest: Mild atelectasis present dependently within the visualized lung bases. Visualized lungs are otherwise clear. Cardiac pacemaker electrodes and median sternotomy wires partially visualized. Mild cardiomegaly. Hepatobiliary: Liver demonstrates a normal contrast enhanced appearance. Mild mucosal enhancement with hazy inflammatory stranding about the gallbladder, which could reflect sequela of acute cholecystitis. No radiopaque calculi identified. Common bile duct of normal caliber with no biliary dilatation identified. The small gas-filled duodenum diverticulum suspected near the sphincterof Oddi (series 201, image 42). Pancreas: Pancreas within normal limits. No pancreatic ductal dilatation. Spleen: Spleen within normal limits. Adrenals/Urinary Tract: Adrenal glands are normal. Kidneys equal in size with symmetric enhancement. Scattered cortical scarring noted within the left kidney. No nephrolithiasis or hydronephrosis. No focal enhancing renal mass. Ureters of normal caliber without acute abnormality. Bladder within normal limits. Stomach/Bowel: Stomach within normal limits. No evidence for bowel obstruction. Appendix is within normal limits. Mild diffuse wall thickening about the descending and sigmoid colon felt to be related incomplete distension. No acute inflammatory changes seen about the bowels. Vascular/Lymphatic: Normal intravascular enhancement seen  throughout the intra-abdominal aorta and its branch vessels. No adenopathy. Reproductive: Prostate normal. Other: No free air or fluid. Musculoskeletal: No acute osseous abnormality. No worrisome lytic or blastic osseous lesions. IMPRESSION: 1. Mucosal enhancement with hazy inflammatory stranding about the gallbladder, suspicious for possible acute cholecystitis. Correlation with laboratory values  and symptomatology recommended. No radiopaque calculi or overt biliary dilatation identified. 2. No other acute intra- abdominal or pelvic process identified. Electronically Signed   By: Jeannine Boga M.D.   On: 03/09/2016 01:33    Review of Systems  Constitutional: Negative for chills and fever.  HENT: Negative for hearing loss.   Eyes: Negative for blurred vision and double vision.  Respiratory: Negative for cough and hemoptysis.   Cardiovascular: Negative for chest pain and palpitations.  Gastrointestinal: Positive for abdominal pain and nausea. Negative for vomiting.  Genitourinary: Negative for dysuria and urgency.  Musculoskeletal: Negative for myalgias and neck pain.  Skin: Negative for itching and rash.  Neurological: Negative for dizziness, tingling and headaches.  Endo/Heme/Allergies: Does not bruise/bleed easily.  Psychiatric/Behavioral: Negative for depression and suicidal ideas.   Blood pressure 105/61, pulse 63, temperature 97.7 F (36.5 C), temperature source Oral, resp. rate 20, height '6\' 3"'  (1.905 m), weight 99.2 kg (218 lb 11.1 oz), SpO2 98 %. Physical Exam  Vitals reviewed. Constitutional: He is oriented to person, place, and time. He appears well-developed and well-nourished.  HENT:  Head: Normocephalic and atraumatic.  Eyes: Conjunctivae and EOM are normal. Pupils are equal, round, and reactive to light.  Neck: Normal range of motion. Neck supple.  Cardiovascular: Normal rate and regular rhythm.   Respiratory: Effort normal and breath sounds normal.  Tender over right  9 and 10 ribs  GI: Soft. Bowel sounds are normal. He exhibits no distension. There is no tenderness.  Musculoskeletal: Normal range of motion.  Neurological: He is alert and oriented to person, place, and time.  Skin: Skin is warm and dry.  Psychiatric: He has a normal mood and affect. His behavior is normal.    Assessment/Plan: 40 yo male with epigastric pain. Reviewing his CT scan and US show no presence of stones, his HIDA showing normal filling of the gallbladder. His pain is more along ribs 9 and 10 rather than subcostal and he has no LFT elevations.   Given these findings I do not think he has cholecystitis. We discussed that he may have dyskinesia, however given his heart function, it is not worth the risk to proceed with a diagnostic cholecystectomy or cholecystectomy with out hard signs of cholecystitis/choledocholithiasis/gallstone pancreatitis. If diagnosis of infection is still entertained, empiric abx could be warranted.  I recommended treating empirically for an ulcer with smoking cessation, nexium +/- carafate. He stated he couldn't quit without chantix and was economically kept from such a prescription. This perceived limitation is a major barrier in treatment of this patient with end stage complications related to atherosclerosis.  Arta Bruce Kinsinger 03/09/2016, 1:02 PM

## 2016-03-09 NOTE — H&P (Signed)
History and Physical    John Davenport [id number] DOB: January 26, 1976 DOA: 03/08/2016  PCP: No PCP Per Patient   Patient coming from: Home  Chief Complaint: RUQ abdominal pain   HPI: John Davenport is a 40 y.o. male with medical history significant for CAD status post CABG and subsequent stenting, ischemic cardiomyopathy with EF 20% and implanted defibrillator, OSA, tobacco abuse, and GERD who presents the emergency department with oxygen only 2 weeks of right upper quadrant pain with nausea and dry heaves. Patient reports that he had been in his usual state until the insidious development of pain in the right upper quadrant approximately 2 weeks ago. Pain was initially intermittent, though the patient was unable to identify any triggers for it or alleviating factors. Over the past 1-2 days, the pain is becoming more constant and severe with severe nausea and dry heaves, but no vomiting or diarrhea. The patient denies any fevers or chills, denies chest pain or palpitations, and denies any increase in his chronic exertional dyspnea. Patient has never experienced similar symptoms previously. The patient called EMS for transport to the hospital and was treated with fentanyl, Zofran, and 300 cc of normal saline en route.  ED Course: Upon arrival to the ED, patient is found to be saturating well on room air and with vital signs stable. EKG features a sinus rhythm with PVC and nonspecific interventricular conduction delay. Chemistry panels notable for a serum creatinine 1.33 which appears consistent with his baseline, and a mild elevation in ALT to 67. CBC is notable for a leukocytosis to 15,200 and troponin is undetectable. Right upper quadrant ultrasound reveals mild gallbladder wall thickening and adenomyomatosis of the gallbladder. This was followed with CT of the abdomen which demonstrates mucosal enhancement and hazy inflammatory surrounded about the gallbladder suspicious for acute  cholecystitis. There are no radiopaque stones or biliary dilation noted on the CT. Patient was treated with 3 doses of IV morphine 4 mg and 4 mg IV Zofran, but continues to complain of severe right upper quadrant abdominal pain. He has remained hemodynamically stable and in no apparent respiratory distress and will be observed on the medical-surgical unit for ongoing evaluation and management.  Review of Systems:  All other systems reviewed and apart from HPI, are negative.  Past Medical History:  Diagnosis Date  . Anxiety state, unspecified   . Back pain   . Chronic systolic heart failure ([place])    a. Ischemic CM (EF 30% by echo in 2011) b. echo 08/2015: EF 20% with severe HK and AK of the inferoseptal and apical myocardium.  . Coronary artery disease    a. 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  b. eventually required single vessel CABG (L-LAD) in 03/2009 due to restenosis. c. cath 2013 showed patent LIMA sequential to the diags with LAD filling retrograde, no new disease in RCA and Cx, low LVEDP, sx felt noncardiac. d. 08/2015: NSTEMI: CTO of prox LAD, 99% stenosis of small dCx (no stent), + for cocaine  . Esophageal reflux   . Heart attack   . Hypercholesteremia   . ICD (implantable cardioverter-defibrillator) infection ([place])    a. [place] [facility] ICD 12/2008.   . Ischemic cardiomyopathy    echocardiogram 12/11: Mild LVH, EF 30-35%, anteroseptal and apical akinesis, grade 1 diastolic dysfunction  . LBP (low back pain)   . OSA (obstructive sleep apnea) 12/30/2010  . Paroxysmal ventricular tachycardia ([place])   . Tobacco  abuse     Past Surgical History:  Procedure Laterality Date  . CARDIAC CATHETERIZATION N/A 09/06/2015   Procedure: Left Heart Cath and Cors/Grafts Angiography;  Surgeon: Sherren Mocha, MD;  Location: [facility];  Service: Cardiovascular;  Laterality: N/A;  . CARDIAC DEFIBRILLATOR PLACEMENT    . CARDIAC DEFIBRILLATOR PLACEMENT      2010 by JA  . CORONARY ANGIOPLASTY WITH STENT PLACEMENT     LAD stenting 10/09 and re-opening 6/10  . CORONARY ARTERY BYPASS GRAFT     1/11  . LEFT HEART CATHETERIZATION WITH CORONARY ANGIOGRAM N/A 08/07/2011   Procedure: LEFT HEART CATHETERIZATION WITH CORONARY ANGIOGRAM;  Surgeon: Josue Hector, MD;  Location[facility];  Service: Cardiovascular;  Laterality: N/A;     reports that he has been smoking Cigarettes.  He has a 17.00 pack-year smoking history. He has quit using smokeless tobacco. His smokeless tobacco use included Chew. He reports that he does not drink alcohol or use drugs.  No Known Allergies  Family History  Problem Relation Age of Onset  . Heart attack Father 52    died at 65 after multiple MI's  . COPD Maternal Grandmother      Prior to Admission medications   Medication Sig Start Date End Date Taking? Authorizing Provider  aspirin EC 81 MG EC tablet Take 1 tablet (81 mg total) by mouth daily. 09/07/15  Yes Erma Heritage, PA  atorvastatin (LIPITOR) 40 MG tablet Take 1 tablet (40 mg total) by mouth daily. 01/18/16  Yes Larey Dresser, MD  carvedilol (COREG) 6.25 MG tablet Take 1 tablet (6.25 mg total) by mouth 2 (two) times daily. 12/26/15  Yes Thompson Grayer, MD  Esomeprazole Magnesium (NEXIUM 24HR) 20 MG TBEC Take 20 mg by mouth daily.   Yes Historical Provider, MD  furosemide (LASIX) 40 MG tablet Take 20-40 mg by mouth See admin instructions. 40 mg in the morning and 20 mg in the evening   Yes Historical Provider, MD  isosorbide mononitrate (IMDUR) 60 MG 24 hr tablet Take 1 tablet (60 mg total) by mouth daily. 09/24/15 09/23/16 Yes Brittainy Erie Noe, PA-C  nitroGLYCERIN (NITROSTAT) 0.4 MG SL tablet Place 1 tablet (0.4 mg total) under the tongue every 5 (five) minutes as needed for chest pain. 09/24/15  Yes Brittainy Erie Noe, PA-C  potassium chloride SA (K-DUR,KLOR-CON) 20 MEQ tablet Take 1 tablet (20 mEq total) by mouth daily. 01/15/16  Yes Larey Dresser, MD    sacubitril-valsartan (ENTRESTO) 24-26 MG Take 1 tablet by mouth 2 (two) times daily. 01/15/16  Yes Larey Dresser, MD  spironolactone (ALDACTONE) 25 MG tablet Take 0.5 tablets (12.5 mg total) by mouth daily. 02/04/16 05/04/16 Yes Larey Dresser, MD    Physical Exam: Vitals:   03/09/16 0015 03/09/16 0030 03/09/16 0045 03/09/16 0145  BP: 113/72 91/64 95/60  121/77  Pulse: 82 75 72 75  Resp: 18 22 15 23   SpO2: 94% 96% 94% 96%  Weight:      Height:          Constitutional: NAD, calm, in obvious discomfort Eyes: PERTLA, lids and conjunctivae normal ENMT: Mucous membranes are moist. Posterior pharynx clear of any exudate or lesions.   Neck: normal, supple, no masses, no thyromegaly Respiratory: clear to auscultation bilaterally, no rhonchi, no crackles. Scattered wheezes. Normal respiratory effort.  Cardiovascular: S1 & S2 heard, regular rate and rhythm. No extremity edema. No significant JVD. Abdomen: No distension, tender in epigastrium and RUQ without rebound  pain or guarding. Bowel sounds normal.  Musculoskeletal: no clubbing / cyanosis. No joint deformity upper and lower extremities. Normal muscle tone.  Skin: no significant rashes, lesions, ulcers. Warm, dry, well-perfused. Neurologic: CN 2-12 grossly intact. Sensation intact, DTR normal. Strength 5/5 in all 4 limbs.  Psychiatric: Normal judgment and insight. Alert and oriented x 3. Normal mood and affect.     Labs on Admission: I have personally reviewed following labs and imaging studies  CBC:  Recent Labs Lab 03/08/16 2307  WBC 15.2*  NEUTROABS 12.0*  HGB 14.9  HCT 45.4  MCV 91.3  PLT [id number]   Basic Metabolic Panel:  Recent Labs Lab 03/08/16 2307  NA 136  K 3.9  CL 105  CO2 23  GLUCOSE 131*  BUN 13  CREATININE 1.33*  CALCIUM 9.1   GFR: Estimated Creatinine Clearance: 94.6 mL/min (by C-G formula based on SCr of 1.33 mg/dL (H)). Liver Function Tests:  Recent Labs Lab 03/08/16 2307  AST 31  ALT 67*   ALKPHOS 70  BILITOT 0.6  PROT 6.5  ALBUMIN 3.5    Recent Labs Lab 03/08/16 2307  LIPASE 21   No results for input(s): AMMONIA in the last 168 hours. Coagulation Profile: No results for input(s): INR, PROTIME in the last 168 hours. Cardiac Enzymes:  Recent Labs Lab 03/08/16 2307  TROPONINI <0.03   BNP (last 3 results) No results for input(s): PROBNP in the last 8760 hours. HbA1C: No results for input(s): HGBA1C in the last 72 hours. CBG: No results for input(s): GLUCAP in the last 168 hours. Lipid Profile: No results for input(s): CHOL, HDL, LDLCALC, TRIG, CHOLHDL, LDLDIRECT in the last 72 hours. Thyroid Function Tests: No results for input(s): TSH, T4TOTAL, FREET4, T3FREE, THYROIDAB in the last 72 hours. Anemia Panel: No results for input(s): VITAMINB12, FOLATE, FERRITIN, TIBC, IRON, RETICCTPCT in the last 72 hours. Urine analysis:    Component Value Date/Time   COLORURINE YELLOW 04/09/2009 [place] 04/09/2009 1132   LABSPEC 1.013 04/09/2009 1132   PHURINE 6.0 04/09/2009 1132   GLUCOSEU NEGATIVE 04/09/2009 1132   HGBUR NEGATIVE 04/09/2009 1132   BILIRUBINUR NEGATIVE 04/09/2009 1132   KETONESUR NEGATIVE 04/09/2009 1132   PROTEINUR NEGATIVE 04/09/2009 1132   UROBILINOGEN 0.2 04/09/2009 1132   NITRITE NEGATIVE 04/09/2009 1132   LEUKOCYTESUR TRACE (A) 04/09/2009 1132   Sepsis Labs: @LABRCNTIP (procalcitonin:4,lacticidven:4) )No results found for this or any previous visit (from the past 240 hour(s)).   Radiological Exams on Admission: US Abdomen Complete  Result Date: 03/08/2016 CLINICAL DATA:  Right upper quadrant pain, nausea and vomiting for 2 weeks EXAM: ABDOMEN ULTRASOUND COMPLETE COMPARISON:  None. FINDINGS: Gallbladder: There is mild gallbladder wall thickening, 4.4 mm. There is comet tail artifact in the gallbladder wall, consistent with adenomyomatosis. No calculi are evident. The patient was not tender to probe pressure over the  gallbladder. Common bile duct: Diameter: 3.5 mm Liver: Mildly coarsened hepatic parenchymal echotexture without discrete lesion. IVC: No abnormality visualized. Pancreas: Not well seen. Spleen: Size and appearance within normal limits. Right Kidney: Length: 11.1 cm. Echogenicity within normal limits. No mass or hydronephrosis visualized. Left Kidney: Length: 11.9 cm. Echogenicity within normal limits. No mass or hydronephrosis visualized. Abdominal aorta: No aneurysm visualized. Other findings: None. IMPRESSION: Mild gallbladder wall thickening and probable adenomyomatosis. No tenderness over the gallbladder. No calculi. Mildly coarsened hepatic parenchymal echotexture could represent fatty infiltration. Electronically Signed   By: Andreas Newport M.D.   On: 03/08/2016 23:56   Ct Abdomen  Pelvis W Contrast  Result Date: 03/09/2016 CLINICAL DATA:  Initial evaluation for acute epigastric pain with nausea and vomiting. EXAM: CT ABDOMEN AND PELVIS WITH CONTRAST TECHNIQUE: Multidetector CT imaging of the abdomen and pelvis was performed using the standard protocol following bolus administration of intravenous contrast. CONTRAST:  1 ISOVUE-300 IOPAMIDOL (ISOVUE-300) INJECTION 61% COMPARISON:  Prior ultrasound from earlier the same day. FINDINGS: Lower chest: Mild atelectasis present dependently within the visualized lung bases. Visualized lungs are otherwise clear. Cardiac pacemaker electrodes and median sternotomy wires partially visualized. Mild cardiomegaly. Hepatobiliary: Liver demonstrates a normal contrast enhanced appearance. Mild mucosal enhancement with hazy inflammatory stranding about the gallbladder, which could reflect sequela of acute cholecystitis. No radiopaque calculi identified. Common bile duct of normal caliber with no biliary dilatation identified. The small gas-filled duodenum diverticulum suspected near the sphincterof Oddi (series 201, image 42). Pancreas: Pancreas within normal limits. No  pancreatic ductal dilatation. Spleen: Spleen within normal limits. Adrenals/Urinary Tract: Adrenal glands are normal. Kidneys equal in size with symmetric enhancement. Scattered cortical scarring noted within the left kidney. No nephrolithiasis or hydronephrosis. No focal enhancing renal mass. Ureters of normal caliber without acute abnormality. Bladder within normal limits. Stomach/Bowel: Stomach within normal limits. No evidence for bowel obstruction. Appendix is within normal limits. Mild diffuse wall thickening about the descending and sigmoid colon felt to be related incomplete distension. No acute inflammatory changes seen about the bowels. Vascular/Lymphatic: Normal intravascular enhancement seen throughout the intra-abdominal aorta and its branch vessels. No adenopathy. Reproductive: Prostate normal. Other: No free air or fluid. Musculoskeletal: No acute osseous abnormality. No worrisome lytic or blastic osseous lesions. IMPRESSION: 1. Mucosal enhancement with hazy inflammatory stranding about the gallbladder, suspicious for possible acute cholecystitis. Correlation with laboratory values and symptomatology recommended. No radiopaque calculi or overt biliary dilatation identified. 2. No other acute intra- abdominal or pelvic process identified. Electronically Signed   By: Jeannine Boga M.D.   On: 03/09/2016 01:33    EKG: Independently reviewed. Sinus rhythm, PVC, non-specific IVCD  Assessment/Plan  1. Intractable RUQ pain  - Pt presents with RUQ pain, worsening over the past 2 wks, and acutely worse in the day leading up to admission - There is leukocytosis, but no fever, and normal lipase and bilirubin, only mild elevation in ALT  - RUQ [place] with mild GB wall-thickening and adenomyomatosis; CT abd with mucosal enhancement and stranding about the GB concerning for acute cholecystitis, with no radiopaque stone or biliary dilation seen - He has been treated with 4 mg morphine x3 in ED, but  continues to be in significant pain  - Plan to observe on med-surg, keep NPO, control pain and nausea, infection surveillance, and eval further with HIDA scan   2. Coronary artery disease  - Hx of CABG and stents  - Cath (June 2017) with severe LAD disease with total occlusion within stent, mild-mod distal Lt main dz, and subtotal occlusion of distal Cfx into a tiny OM  - He is managed with Lipitor, ASA 81, Coreg, and Imdur; plan to resume when appropriate for diet  - There is no anginal complaints on admission, EKG is not significantly changed, and troponin is undetectable    3. Chronic combined systolic/diastolic CHF - Pt appears euvolemic on admission  - TTE (09/06/15) with EF 20%, severe diffuse HK, grade 2 diastolic dysfunction, mild MR, mild LAE  - AICD is in place  - He is managed with Entresto, Coreg, Lasix, and Aldactone at home  - Plan to hold the  diuretics on admission given NPO status, resume medications as appropriate  - Follow daily wts and I/Os, SLIV   4. CKD stage III  - SCr is 1.33 on admission which appears consistent with his baseline  - Follow fluid-status closely as above    5. GERD - No EGD report on file  - Managed with Nexium at home; will treat with IV Pepcid BID for now while NPO    6. OSA - Continue CPAP qHS   7. Tobacco abuse  - Counseled toward cessation - Nicotine replacement offered  - RN asked to provide smoking cessation information prior to discharge    DVT prophylaxis: sq Lovenox  Code Status: Full  Family Communication: Discussed with patient Disposition Plan: Observe on med-surg Consults called: None Admission status: Observation    Vianne Bulls, MD [facility] Pager [phone]  If 7PM-7AM, please contact night-coverage [email or web address] Password [email or web address]  03/09/2016, 2:47 AM

## 2016-03-09 NOTE — Progress Notes (Signed)
Patient complains of pain and requests for pain medication. RN enters room and finds patient sound asleep and does not respond to voice, and opens eyes to touch only. Paged on call, Schorr, NP, to notify of excessive sleepiness. Orders received, will continue to monitor patient.   Ermalinda Memos, RN

## 2016-03-09 NOTE — ED Notes (Signed)
Pt to CT

## 2016-03-09 NOTE — Progress Notes (Signed)
PROGRESS NOTE    John Davenport  [id number] DOB: 1976/02/10 DOA: 03/08/2016 PCP: No PCP Per Patient    Brief Narrative:  This is a 40 year old male who presents with right upper quadrant abdominal pain. Patient had intermittent postprandial abdominal pain over last few weeks, but over last 24 hours his pain has been constant, sharp, severe in intensity, worse with meals, no radiation, no improving symptoms. On initial evaluation he was in pain, at the right upper quadrant, his CT scan showed possible cholecystitis, ultrasound was negative as well as his HIDA scan. Patient was admitted for further workup. Patient is nothing by mouth, IV analgesics and IV antiemetics. Surgery has been consulted.    Assessment & Plan:   Principal Problem:   Intractable upper abdominal pain Active Problems:   CAD in native artery   Chronic combined systolic and diastolic CHF (congestive heart failure) (HCC)   GERD   OSA (obstructive sleep apnea)   Ischemic cardiomyopathy   CKD (chronic kidney disease), stage III   Adenomyomatosis of gallbladder   Intractable right upper quadrant abdominal pain   1. Right upper abdominal pain, to rule out biliary colic. Will continue pain control with fentanyl, continue NPO, IV antiemetics and antiacid therapy. Symptoms concerning for biliary disease, even though noted negative [place] and HIDA scan. Consulted surgery for evaluation.   2. Systolic heart failure chronic, ef 20% sp AICD. Clinically mild hypovolemic, will add gentle hydration with saline, will continue asa, coreg, isosorbide and entresto.   3. Coronary artery disease. No active chest pain, will continue aspirin and statin therapy.   4. CKD stage 3. Renal function with cr at 1.31 with K at 4,6 and serum bicarbonate at 25, will continue to follow renal panel in am, avoid hypotension or nephrotoxic medications.   5. GERD. Will continue famotidine, patient NPO.   6. Tobacco abuse. Smoking cessation.    7. OSA. Cpap at night.    DVT prophylaxis: enoxaparin  Code Status: Full  Family Communication: no family at the bedside  Disposition Plan: home    Consultants:   Surgery   Procedures:    Antimicrobials:   Subjective: Patient with pain in the right upper quadrant, has been persistent over last 24 hours, sharp in nature and severe in intensity, associated with decreased appetite and is worse with meals. No nausea or vomiting,   Objective: Vitals:   03/09/16 0045 03/09/16 0145 03/09/16 0300 03/09/16 0900  BP: 95/60 121/77 103/66 105/61  Pulse: 72 75 79 63  Resp: 15 23 20 20   Temp:   98.2 F (36.8 C) 97.7 F (36.5 C)  TempSrc:   Oral Oral  SpO2: 94% 96% 96% 98%  Weight:   99.2 kg (218 lb 11.1 oz)   Height:   6\' 3"  (1.905 m)     Intake/Output Summary (Last 24 hours) at 03/09/16 1207 Last data filed at 03/09/16 [id number]  Gross per 24 hour  Intake               50 ml  Output                0 ml  Net               50 ml   Filed Weights   03/08/16 2251 03/09/16 0300  Weight: 104.3 kg (230 lb) 99.2 kg (218 lb 11.1 oz)    Examination:  General exam: deconditioned and in pian E ENT: mild pallor but no icterus, oral mucosa dry.  Respiratory system: Clear to auscultation. Respiratory effort normal, no wheezing, rales or rhonchi. Cardiovascular system: S1 & S2 heard, RRR. No JVD, murmurs, rubs, gallops or clicks. No pedal edema. Gastrointestinal system: Abdomen is nondistended,soft. No organomegaly or masses felt. Normal bowel sounds heard. Central nervous system: Alert and oriented. No focal neurological deficits. Extremities: Symmetric 5 x 5 power. Skin: No rashes, lesions or ulcers Psychiatry: Judgement and insight appear normal. Mood & affect appropriate.     Data Reviewed: I have personally reviewed following labs and imaging studies  CBC:  Recent Labs Lab 03/08/16 2307  WBC 15.2*  NEUTROABS 12.0*  HGB 14.9  HCT 45.4  MCV 91.3  PLT [id number]   Basic  Metabolic Panel:  Recent Labs Lab 03/08/16 2307 03/09/16 0300  NA 136 138  K 3.9 4.6  CL 105 105  CO2 23 25  GLUCOSE 131* 108*  BUN 13 12  CREATININE 1.33* 1.31*  CALCIUM 9.1 9.1   GFR: Estimated Creatinine Clearance: 87.8 mL/min (by C-G formula based on SCr of 1.31 mg/dL (H)). Liver Function Tests:  Recent Labs Lab 03/08/16 2307 03/09/16 0300  AST 31 78*  ALT 67* 92*  ALKPHOS 70 78  BILITOT 0.6 0.5  PROT 6.5 6.3*  ALBUMIN 3.5 3.4*    Recent Labs Lab 03/08/16 2307  LIPASE 21   No results for input(s): AMMONIA in the last 168 hours. Coagulation Profile: No results for input(s): INR, PROTIME in the last 168 hours. Cardiac Enzymes:  Recent Labs Lab 03/08/16 2307  TROPONINI <0.03   BNP (last 3 results) No results for input(s): PROBNP in the last 8760 hours. HbA1C: No results for input(s): HGBA1C in the last 72 hours. CBG:  Recent Labs Lab 03/09/16 0746  GLUCAP 93   Lipid Profile: No results for input(s): CHOL, HDL, LDLCALC, TRIG, CHOLHDL, LDLDIRECT in the last 72 hours. Thyroid Function Tests: No results for input(s): TSH, T4TOTAL, FREET4, T3FREE, THYROIDAB in the last 72 hours. Anemia Panel: No results for input(s): VITAMINB12, FOLATE, FERRITIN, TIBC, IRON, RETICCTPCT in the last 72 hours. Sepsis Labs: No results for input(s): PROCALCITON, LATICACIDVEN in the last 168 hours.  No results found for this or any previous visit (from the past 240 hour(s)).       Radiology Studies: Nm Hepatobiliary Liver Func  Result Date: 03/09/2016 CLINICAL DATA:  Intractable RIGHT upper quadrant abdominal pain EXAM: NUCLEAR MEDICINE HEPATOBILIARY IMAGING TECHNIQUE: Sequential images of the abdomen were obtained out to 60 minutes following intravenous administration of radiopharmaceutical. RADIOPHARMACEUTICALS:  5.21 mCi Tc-24m  Choletec IV COMPARISON:  CT abdomen and pelvis 03/09/2016 FINDINGS: Normal clearance of blood pool indicating normal hepatocellular  function. Prompt excretion of tracer into biliary tree. Small bowel visualized at 16 minutes. Gallbladder visualized beginning at 60 minutes. Findings are consistent with patency of the cystic duct and CBD. IMPRESSION: Normal exam. Electronically Signed   By: Lavonia Dana M.D.   On: 03/09/2016 11:23   US Abdomen Complete  Result Date: 03/08/2016 CLINICAL DATA:  Right upper quadrant pain, nausea and vomiting for 2 weeks EXAM: ABDOMEN ULTRASOUND COMPLETE COMPARISON:  None. FINDINGS: Gallbladder: There is mild gallbladder wall thickening, 4.4 mm. There is comet tail artifact in the gallbladder wall, consistent with adenomyomatosis. No calculi are evident. The patient was not tender to probe pressure over the gallbladder. Common bile duct: Diameter: 3.5 mm Liver: Mildly coarsened hepatic parenchymal echotexture without discrete lesion. IVC: No abnormality visualized. Pancreas: Not well seen. Spleen: Size and appearance within normal limits. Right Kidney: Length:  11.1 cm. Echogenicity within normal limits. No mass or hydronephrosis visualized. Left Kidney: Length: 11.9 cm. Echogenicity within normal limits. No mass or hydronephrosis visualized. Abdominal aorta: No aneurysm visualized. Other findings: None. IMPRESSION: Mild gallbladder wall thickening and probable adenomyomatosis. No tenderness over the gallbladder. No calculi. Mildly coarsened hepatic parenchymal echotexture could represent fatty infiltration. Electronically Signed   By: Andreas Newport M.D.   On: 03/08/2016 23:56   Ct Abdomen Pelvis W Contrast  Result Date: 03/09/2016 CLINICAL DATA:  Initial evaluation for acute epigastric pain with nausea and vomiting. EXAM: CT ABDOMEN AND PELVIS WITH CONTRAST TECHNIQUE: Multidetector CT imaging of the abdomen and pelvis was performed using the standard protocol following bolus administration of intravenous contrast. CONTRAST:  1 ISOVUE-300 IOPAMIDOL (ISOVUE-300) INJECTION 61% COMPARISON:  Prior ultrasound  from earlier the same day. FINDINGS: Lower chest: Mild atelectasis present dependently within the visualized lung bases. Visualized lungs are otherwise clear. Cardiac pacemaker electrodes and median sternotomy wires partially visualized. Mild cardiomegaly. Hepatobiliary: Liver demonstrates a normal contrast enhanced appearance. Mild mucosal enhancement with hazy inflammatory stranding about the gallbladder, which could reflect sequela of acute cholecystitis. No radiopaque calculi identified. Common bile duct of normal caliber with no biliary dilatation identified. The small gas-filled duodenum diverticulum suspected near the sphincterof Oddi (series 201, image 42). Pancreas: Pancreas within normal limits. No pancreatic ductal dilatation. Spleen: Spleen within normal limits. Adrenals/Urinary Tract: Adrenal glands are normal. Kidneys equal in size with symmetric enhancement. Scattered cortical scarring noted within the left kidney. No nephrolithiasis or hydronephrosis. No focal enhancing renal mass. Ureters of normal caliber without acute abnormality. Bladder within normal limits. Stomach/Bowel: Stomach within normal limits. No evidence for bowel obstruction. Appendix is within normal limits. Mild diffuse wall thickening about the descending and sigmoid colon felt to be related incomplete distension. No acute inflammatory changes seen about the bowels. Vascular/Lymphatic: Normal intravascular enhancement seen throughout the intra-abdominal aorta and its branch vessels. No adenopathy. Reproductive: Prostate normal. Other: No free air or fluid. Musculoskeletal: No acute osseous abnormality. No worrisome lytic or blastic osseous lesions. IMPRESSION: 1. Mucosal enhancement with hazy inflammatory stranding about the gallbladder, suspicious for possible acute cholecystitis. Correlation with laboratory values and symptomatology recommended. No radiopaque calculi or overt biliary dilatation identified. 2. No other acute  intra- abdominal or pelvic process identified. Electronically Signed   By: Jeannine Boga M.D.   On: 03/09/2016 01:33        Scheduled Meds: . aspirin EC  81 mg Oral Daily  . atorvastatin  40 mg Oral Daily  . carvedilol  6.25 mg Oral BID WC  . chlorhexidine  15 mL Mouth Rinse BID  . enoxaparin (LOVENOX) injection  40 mg Subcutaneous Q24H  . famotidine (PEPCID) IV  20 mg Intravenous Q12H  . isosorbide mononitrate  60 mg Oral Daily  . mouth rinse  15 mL Mouth Rinse q12n4p  .  morphine injection  4 mg Intravenous Once  . sacubitril-valsartan  1 tablet Oral BID  . sodium chloride flush  3 mL Intravenous Q12H   Continuous Infusions:   LOS: 0 days       Mauricio Gerome Apley, MD [facility] Pager [phone]  If 7PM-7AM, please contact night-coverage [email or web address] Password [email or web address] 03/09/2016, 12:07 PM

## 2016-03-10 DIAGNOSIS — R1010 Upper abdominal pain, unspecified: Secondary | ICD-10-CM | POA: Diagnosis not present

## 2016-03-10 DIAGNOSIS — I5042 Chronic combined systolic (congestive) and diastolic (congestive) heart failure: Secondary | ICD-10-CM | POA: Diagnosis not present

## 2016-03-10 DIAGNOSIS — I2510 Atherosclerotic heart disease of native coronary artery without angina pectoris: Secondary | ICD-10-CM | POA: Diagnosis not present

## 2016-03-10 DIAGNOSIS — R1013 Epigastric pain: Secondary | ICD-10-CM | POA: Diagnosis not present

## 2016-03-10 DIAGNOSIS — N183 Chronic kidney disease, stage 3 (moderate): Secondary | ICD-10-CM | POA: Diagnosis not present

## 2016-03-10 LAB — CBC WITH DIFFERENTIAL/PLATELET
BASOS ABS: 0.1 10*3/uL (ref 0.0–0.1)
Basophils Relative: 1 %
Eosinophils Absolute: 0.4 10*3/uL (ref 0.0–0.7)
Eosinophils Relative: 5 %
HEMATOCRIT: 42.3 % (ref 39.0–52.0)
Hemoglobin: 13.6 g/dL (ref 13.0–17.0)
LYMPHS PCT: 38 %
Lymphs Abs: 2.9 10*3/uL (ref 0.7–4.0)
MCH: 29.4 pg (ref 26.0–34.0)
MCHC: 32.2 g/dL (ref 30.0–36.0)
MCV: 91.6 fL (ref 78.0–100.0)
MONO ABS: 0.6 10*3/uL (ref 0.1–1.0)
Monocytes Relative: 8 %
NEUTROS ABS: 3.6 10*3/uL (ref 1.7–7.7)
Neutrophils Relative %: 48 %
Platelets: 158 10*3/uL (ref 150–400)
RBC: 4.62 MIL/uL (ref 4.22–5.81)
RDW: 13.9 % (ref 11.5–15.5)
WBC: 7.6 10*3/uL (ref 4.0–10.5)

## 2016-03-10 LAB — BASIC METABOLIC PANEL
ANION GAP: 7 (ref 5–15)
BUN: 13 mg/dL (ref 6–20)
CO2: 29 mmol/L (ref 22–32)
Calcium: 8.9 mg/dL (ref 8.9–10.3)
Chloride: 103 mmol/L (ref 101–111)
Creatinine, Ser: 1.39 mg/dL — ABNORMAL HIGH (ref 0.61–1.24)
GFR calc Af Amer: >60 mL/min (ref >60)
GFR calc non Af Amer: >60 mL/min (ref >60)
GLUCOSE: 93 mg/dL (ref 65–99)
Potassium: 4 mmol/L (ref 3.5–5.1)
Sodium: 139 mmol/L (ref 135–145)

## 2016-03-10 LAB — GLUCOSE, CAPILLARY: Glucose-Capillary: 87 mg/dL (ref 65–99)

## 2016-03-10 NOTE — Discharge Summary (Signed)
Contacted by RN Wray Kearns and informed patient was leaving AMA. I asked was patient competent and ED understand that failure to remain and undergo therapeutic/treatment modalities discussed by a previous attending could result in increased morbidity and death. Patient informed RN Banyan Tracht he understood the risks and was leaving. No charge

## 2016-03-10 NOTE — Final Progress Note (Signed)
Patient decided to leave AMA.  Education done on what might happen after he leaves(eat, pain returns, he returns to hospital).  Also discussed slowly advancing his diet by starting with clears...  He was adamant on leaving the hospital.  Dr Sherral Hammers notified.

## 2016-03-10 NOTE — Progress Notes (Signed)
[facility] Progress Note     Subjective: Still complaining of RUQ abdominal pain. It is unchanged. No nausea or vomiting. Pt states he needs to leave today.  Objective: Vital signs in last 24 hours: Temp:  [97.5 F (36.4 C)-97.8 F (36.6 C)] 97.5 F (36.4 C) (12/11 0357) Pulse Rate:  [62-90] 70 (12/11 0357) Resp:  [17-20] 17 (12/11 0357) BP: (105-132)/(61-97) 109/68 (12/11 0357) SpO2:  [97 %-98 %] 97 % (12/11 0357) Weight:  [217 lb 9.5 oz (98.7 kg)] 217 lb 9.5 oz (98.7 kg) (12/10 2123) Last BM Date: 03/08/16  Intake/Output from previous day: 12/10 0701 - 12/11 0700 In: 914.2 [I.V.:814.2; IV Piggyback:100] Out: 0  Intake/Output this shift: No intake/output data recorded.  PE: Gen:  Alert, NAD, cooperative, appears older than stated age Card:  Normal rate, no M/G/R heard Abd: Soft, ND, +BS, mild TTP of RUQ, no guarding Skin: no rashes noted, warm and dry  Lab Results:   Recent Labs  03/08/16 2307 03/10/16 0644  WBC 15.2* 7.6  HGB 14.9 13.6  HCT 45.4 42.3  PLT 172 158   BMET  Recent Labs  03/09/16 0300 03/10/16 0644  NA 138 139  K 4.6 4.0  CL 105 103  CO2 25 29  GLUCOSE 108* 93  BUN 12 13  CREATININE 1.31* 1.39*  CALCIUM 9.1 8.9   PT/INR No results for input(s): LABPROT, INR in the last 72 hours. CMP     Component Value Date/Time   NA 139 03/10/2016 0644   K 4.0 03/10/2016 0644   CL 103 03/10/2016 0644   CO2 29 03/10/2016 0644   GLUCOSE 93 03/10/2016 0644   BUN 13 03/10/2016 0644   CREATININE 1.39 (H) 03/10/2016 0644   CALCIUM 8.9 03/10/2016 0644   PROT 6.3 (L) 03/09/2016 0300   ALBUMIN 3.4 (L) 03/09/2016 0300   AST 78 (H) 03/09/2016 0300   ALT 92 (H) 03/09/2016 0300   ALKPHOS 78 03/09/2016 0300   BILITOT 0.5 03/09/2016 0300   GFRNONAA >60 03/10/2016 0644   GFRAA >60 03/10/2016 0644   Lipase     Component Value Date/Time   LIPASE 21 03/08/2016 2307       Studies/Results: Nm Hepatobiliary Liver Func  Result Date:  03/09/2016 CLINICAL DATA:  Intractable RIGHT upper quadrant abdominal pain EXAM: NUCLEAR MEDICINE HEPATOBILIARY IMAGING TECHNIQUE: Sequential images of the abdomen were obtained out to 60 minutes following intravenous administration of radiopharmaceutical. RADIOPHARMACEUTICALS:  5.21 mCi Tc-81m  Choletec IV COMPARISON:  CT abdomen and pelvis 03/09/2016 FINDINGS: Normal clearance of blood pool indicating normal hepatocellular function. Prompt excretion of tracer into biliary tree. Small bowel visualized at 16 minutes. Gallbladder visualized beginning at 60 minutes. Findings are consistent with patency of the cystic duct and CBD. IMPRESSION: Normal exam. Electronically Signed   By: Lavonia Dana M.D.   On: 03/09/2016 11:23   US Abdomen Complete  Result Date: 03/08/2016 CLINICAL DATA:  Right upper quadrant pain, nausea and vomiting for 2 weeks EXAM: ABDOMEN ULTRASOUND COMPLETE COMPARISON:  None. FINDINGS: Gallbladder: There is mild gallbladder wall thickening, 4.4 mm. There is comet tail artifact in the gallbladder wall, consistent with adenomyomatosis. No calculi are evident. The patient was not tender to probe pressure over the gallbladder. Common bile duct: Diameter: 3.5 mm Liver: Mildly coarsened hepatic parenchymal echotexture without discrete lesion. IVC: No abnormality visualized. Pancreas: Not well seen. Spleen: Size and appearance within normal limits. Right Kidney: Length: 11.1 cm. Echogenicity within normal limits. No mass or hydronephrosis visualized.  Left Kidney: Length: 11.9 cm. Echogenicity within normal limits. No mass or hydronephrosis visualized. Abdominal aorta: No aneurysm visualized. Other findings: None. IMPRESSION: Mild gallbladder wall thickening and probable adenomyomatosis. No tenderness over the gallbladder. No calculi. Mildly coarsened hepatic parenchymal echotexture could represent fatty infiltration. Electronically Signed   By: Andreas Newport M.D.   On: 03/08/2016 23:56   Ct  Abdomen Pelvis W Contrast  Result Date: 03/09/2016 CLINICAL DATA:  Initial evaluation for acute epigastric pain with nausea and vomiting. EXAM: CT ABDOMEN AND PELVIS WITH CONTRAST TECHNIQUE: Multidetector CT imaging of the abdomen and pelvis was performed using the standard protocol following bolus administration of intravenous contrast. CONTRAST:  1 ISOVUE-300 IOPAMIDOL (ISOVUE-300) INJECTION 61% COMPARISON:  Prior ultrasound from earlier the same day. FINDINGS: Lower chest: Mild atelectasis present dependently within the visualized lung bases. Visualized lungs are otherwise clear. Cardiac pacemaker electrodes and median sternotomy wires partially visualized. Mild cardiomegaly. Hepatobiliary: Liver demonstrates a normal contrast enhanced appearance. Mild mucosal enhancement with hazy inflammatory stranding about the gallbladder, which could reflect sequela of acute cholecystitis. No radiopaque calculi identified. Common bile duct of normal caliber with no biliary dilatation identified. The small gas-filled duodenum diverticulum suspected near the sphincterof Oddi (series 201, image 42). Pancreas: Pancreas within normal limits. No pancreatic ductal dilatation. Spleen: Spleen within normal limits. Adrenals/Urinary Tract: Adrenal glands are normal. Kidneys equal in size with symmetric enhancement. Scattered cortical scarring noted within the left kidney. No nephrolithiasis or hydronephrosis. No focal enhancing renal mass. Ureters of normal caliber without acute abnormality. Bladder within normal limits. Stomach/Bowel: Stomach within normal limits. No evidence for bowel obstruction. Appendix is within normal limits. Mild diffuse wall thickening about the descending and sigmoid colon felt to be related incomplete distension. No acute inflammatory changes seen about the bowels. Vascular/Lymphatic: Normal intravascular enhancement seen throughout the intra-abdominal aorta and its branch vessels. No adenopathy.  Reproductive: Prostate normal. Other: No free air or fluid. Musculoskeletal: No acute osseous abnormality. No worrisome lytic or blastic osseous lesions. IMPRESSION: 1. Mucosal enhancement with hazy inflammatory stranding about the gallbladder, suspicious for possible acute cholecystitis. Correlation with laboratory values and symptomatology recommended. No radiopaque calculi or overt biliary dilatation identified. 2. No other acute intra- abdominal or pelvic process identified. Electronically Signed   By: Jeannine Boga M.D.   On: 03/09/2016 01:33    Anti-infectives: Anti-infectives    None       Assessment/Plan  RUQ Abdominal pain:  - Review of CT scan and US revealed no presence of stones, HIDA with normal filling of the gallbladder, afebrile, normal WBC.  - With these results, less likely he has cholecystitis. Etiology is possible dyskinesia, however given his heart function, it is not worth the risk to proceed with a diagnostic cholecystectomy or cholecystectomy with out hard signs of cholecystitis/choledocholithiasis/gallstone pancreatitis. If diagnosis of infection is still entertained, empiric abx could be warranted.  Plan: recommended treating empirically for an ulcer with smoking cessation, nexium +/- carafate. We will sign off on this patient. Please page [place] as needed.     LOS: 1 day    Kalman Drape , [facility] Surgery 03/10/2016, 8:42 AM Pager: [phone] Consults: [phone] Mon-Fri 7:00 am-4:30 pm Sat-Sun 7:00 am-11:30 am

## 2016-03-11 ENCOUNTER — Ambulatory Visit (HOSPITAL_COMMUNITY)
Admission: RE | Admit: 2016-03-11 | Discharge: 2016-03-11 | Disposition: A | Discharge status: Home / Self Care | Payer: Medicare Other | Source: Ambulatory Visit | Attending: Cardiology | Admitting: Cardiology

## 2016-03-11 ENCOUNTER — Encounter (HOSPITAL_COMMUNITY): Payer: Self-pay

## 2016-03-11 VITALS — BP 138/82 | HR 69 | Wt 225.5 lb

## 2016-03-11 DIAGNOSIS — Z79899 Other long term (current) drug therapy: Secondary | ICD-10-CM | POA: Diagnosis not present

## 2016-03-11 DIAGNOSIS — I255 Ischemic cardiomyopathy: Secondary | ICD-10-CM | POA: Insufficient documentation

## 2016-03-11 DIAGNOSIS — R1011 Right upper quadrant pain: Secondary | ICD-10-CM | POA: Insufficient documentation

## 2016-03-11 DIAGNOSIS — I25812 Atherosclerosis of bypass graft of coronary artery of transplanted heart without angina pectoris: Secondary | ICD-10-CM | POA: Diagnosis not present

## 2016-03-11 DIAGNOSIS — I5022 Chronic systolic (congestive) heart failure: Secondary | ICD-10-CM | POA: Diagnosis not present

## 2016-03-11 DIAGNOSIS — I2510 Atherosclerotic heart disease of native coronary artery without angina pectoris: Secondary | ICD-10-CM | POA: Diagnosis not present

## 2016-03-11 DIAGNOSIS — Z951 Presence of aortocoronary bypass graft: Secondary | ICD-10-CM | POA: Insufficient documentation

## 2016-03-11 DIAGNOSIS — K8080 Other cholelithiasis without obstruction: Secondary | ICD-10-CM | POA: Diagnosis not present

## 2016-03-11 DIAGNOSIS — Z9889 Other specified postprocedural states: Secondary | ICD-10-CM | POA: Insufficient documentation

## 2016-03-11 DIAGNOSIS — Z5189 Encounter for other specified aftercare: Secondary | ICD-10-CM | POA: Insufficient documentation

## 2016-03-11 DIAGNOSIS — Z7982 Long term (current) use of aspirin: Secondary | ICD-10-CM | POA: Diagnosis not present

## 2016-03-11 DIAGNOSIS — K819 Cholecystitis, unspecified: Secondary | ICD-10-CM | POA: Diagnosis not present

## 2016-03-11 MED ORDER — CARVEDILOL 6.25 MG PO TABS: 6.2500 mg | ORAL_TABLET | Freq: Two times a day (BID) | ORAL | 2 refills | Status: DC

## 2016-03-11 MED ORDER — FUROSEMIDE 40 MG PO TABS: 40.0000 mg | ORAL_TABLET | Freq: Every day | ORAL | 2 refills | Status: DC

## 2016-03-11 MED ORDER — SACUBITRIL-VALSARTAN 49-51 MG PO TABS: 1.0000 | ORAL_TABLET | Freq: Two times a day (BID) | ORAL | 11 refills | Status: DC

## 2016-03-11 MED ORDER — SPIRONOLACTONE 25 MG PO TABS: 12.5000 mg | ORAL_TABLET | Freq: Every day | ORAL | 2 refills | Status: DC

## 2016-03-11 MED ORDER — POTASSIUM CHLORIDE CRYS ER 20 MEQ PO TBCR: 20.0000 meq | EXTENDED_RELEASE_TABLET | Freq: Every day | ORAL | 2 refills | Status: DC

## 2016-03-11 MED ORDER — ATORVASTATIN CALCIUM 40 MG PO TABS: 40.0000 mg | ORAL_TABLET | Freq: Every day | ORAL | 2 refills | Status: DC

## 2016-03-11 MED ORDER — ISOSORBIDE MONONITRATE ER 60 MG PO TB24
60.0000 mg | ORAL_TABLET | Freq: Every day | ORAL | 2 refills | Status: DC
Start: 2016-03-11 — End: 2016-05-23

## 2016-03-11 MED FILL — SPIRONOLACTONE 25 MG TABLET: 25 | 30 days supply | Qty: 15 | Fill #0

## 2016-03-11 MED FILL — ATORVASTATIN 40 MG TABLET: 40 | 30 days supply | Qty: 30 | Fill #0

## 2016-03-11 MED FILL — FUROSEMIDE 40 MG TABLET: 40 | 30 days supply | Qty: 30 | Fill #0

## 2016-03-11 MED FILL — CARVEDILOL 6.25 MG TABLET: 6.25 | 30 days supply | Qty: 60 | Fill #0

## 2016-03-11 MED FILL — ISOSORBIDE MN ER 60 MG TAB: 60 | 30 days supply | Qty: 30 | Fill #0

## 2016-03-11 MED FILL — KLOR-CON M20 TABLET: 20 | 30 days supply | Qty: 30 | Fill #0

## 2016-03-11 NOTE — Patient Instructions (Signed)
Increase Entresto to 49/51 mg Twice daily   Decrease Furosemide to 40 mg daily  Labs in 10 days  You have been referred to [place] for your gallbladder  Your physician recommends that you schedule a follow-up appointment in: 2 weeks

## 2016-03-11 NOTE — Progress Notes (Signed)
Cardiology: Dr. Aundra Dubin  40 yo with history of CAD s/p CABG, chronic systolic CHF (ischemic cardiomyopathy), and COPD presents for CHF clinic evaluation.  He had his first MI with occlusion of LAD in 10/9 treated with BMS.  He then had POBA of reocclusion of LAD stent in 6/10. Finally, he had sequential LIMA-D1 and D2 in 1/11.  NSTEMI in 6/17 (cocaine positive) showed 99% stenosis small distal LCx as likely culprit. This was medically managed. Echo in 6/17 showed EF 20% with wall motion abnormalities.   He was admitted in 12/17 with RUQ abdominal pain.  There was concern for acute cholecystitis based on CT of abdomen, but HIDA scan was negative. He left AMA.  He still has RUQ pain with eating, no pain in between meals.  He has, therefore, been eating very little.    No chest pain.  He remains short of breath walking 1 block or walking up stairs. No orthopnea/PND. He is generally fatigued.  Chronic cough.  Still smoking, has not yet gotten Chantix. Weight is stable.   ECG (12/17): NSR, IVCD 139 msec  Labs (6/17): K 3.8, creatinine 1.26, HCT 42.4, LDL 81, HDL 34  Labs (10/17): K 4, creatinine 1.42, LDL 122, BNP 475 Labs (12/17): K 4, creatinine 1.39, hgb 13.6  PMH: 1. GERD 2. OSA 3. H/o VT 4. COPD: Active smoker. 5. Chronic systolic CHF: Ischemic cardiomyopathy. Has [employer] ICD.  - Echo (6/17): EF 20%, wall motion abnormalities, mild MR - CPX (11/17): peak VO2 19.7 (51% predicted), VE/VCO2 slope 46.9, RER 0.92 => Submaximal, but probably moderate to severe functional impairment => suspect restrictive/obstructive lung disease and HF play a role in the abnormality, probably moderate impairment in terms of HF.  6. CAD: Acute MI in 10/09 with BMS to LAD - POBA to LAD stent in 6/10.  - CABG with sequential LIMA-D1 and D2 in 1/11. - NSTEMI 6/17: LHC with patent sequential LIMA-D1 and D2 with filling of LAD, occlusion proximal LAD, subtotal occlusion of distal LCx into small OM (likely  culprit).  No PCI. He was cocaine positive.  7. Symptomatic cholelithiasis: Negative HIDA scan 12/17.   SH: Disabled, active smoker, history of cocaine use, no ETOH, lives in [place].  FH: Father with 1st MI in his 89s.  ROS: All systems reviewed and negative except as per HPI.   Current Outpatient Prescriptions  Medication Sig Dispense Refill  . aspirin EC 81 MG EC tablet Take 1 tablet (81 mg total) by mouth daily.    Marland Kitchen atorvastatin (LIPITOR) 40 MG tablet Take 1 tablet (40 mg total) by mouth daily. 30 tablet 2  . carvedilol (COREG) 6.25 MG tablet Take 1 tablet (6.25 mg total) by mouth 2 (two) times daily. 60 tablet 2  . Esomeprazole Magnesium (NEXIUM 24HR) 20 MG TBEC Take 20 mg by mouth daily.    . furosemide (LASIX) 40 MG tablet Take 1 tablet (40 mg total) by mouth daily. 30 tablet 2  . isosorbide mononitrate (IMDUR) 60 MG 24 hr tablet Take 1 tablet (60 mg total) by mouth daily. 30 tablet 2  . potassium chloride SA (K-DUR,KLOR-CON) 20 MEQ tablet Take 1 tablet (20 mEq total) by mouth daily. 30 tablet 2  . spironolactone (ALDACTONE) 25 MG tablet Take 0.5 tablets (12.5 mg total) by mouth daily. 15 tablet 2  . nitroGLYCERIN (NITROSTAT) 0.4 MG SL tablet Place 1 tablet (0.4 mg total) under the tongue every 5 (five) minutes as needed for chest pain. (Patient not taking: Reported  on 03/11/2016) 100 tablet 0  . sacubitril-valsartan (ENTRESTO) 49-51 MG Take 1 tablet by mouth 2 (two) times daily. 60 tablet 11   No current facility-administered medications for this encounter.    BP 138/82   Pulse 69   Wt 225 lb 8 oz (102.3 kg)   SpO2 98%   BMI 28.19 kg/m  General: NAD Neck: JVP 7 cm, no thyromegaly or thyroid nodule.  Lungs: Clear to auscultation bilaterally with normal respiratory effort. CV: Nondisplaced PMI.  Heart regular S1/S2, no S3/S4, no murmur.  No peripheral edema.  No carotid bruit.  Normal pedal pulses.  Abdomen: Soft, no hepatosplenomegaly, no distention.  Mild RUQ tenderness.   Skin: Intact without lesions or rashes.  Neurologic: Alert and oriented x 3.  Psych: Normal affect. Extremities: No clubbing or cyanosis.  HEENT: Normal.   Assessment/Plan: 1. CAD: s/p CABG.  6/17 NSTEMI with subtotal occlusion small distal LCx managed medically.  No chest pain.  - Continue ASA 81.   - Continue statin. He is back on atorvastatin with high LDL on lovastatin.  Lipids/LFTs next appt.   2. Chronic systolic CHF: Ischemic CMP.  [place].  EF 20% on 6/17 echo.  NYHA class III symptoms but no longer volume overloaded on exam.  COPD likely also plays a role in his dyspnea. Submaximal CPX in 11/17 but suggestive of both heart and lung limitation => appears to have moderate limitation in terms of heart failure.  - Increase Entresto to 49/51 bid.  With increase in Entresto, will decrease Lasix to 40 mg daily. BMET 10 days.   - Continue Coreg and spironolactone at current doses.   3. Active smoker: He is motivated to quit smoking.  - Will have pharmacist continue to work with him to get Chantix.  4. Symptomatic cholelithiasis: Significant RUQ pain with meals.  He is eating very little because of this.  He left the hospital AMA.  - I will try to get him an appt as soon as possible with [facility].  He will be a difficult surgical candidate but not out of the question as his CHF appears fairly compensated at this point.   Followup in 2 wks.   Loralie Champagne 03/11/2016

## 2016-03-17 ENCOUNTER — Telehealth (HOSPITAL_COMMUNITY): Payer: Self-pay | Admitting: Pharmacist

## 2016-03-17 MED ORDER — VARENICLINE TARTRATE 1 MG PO TABS: 1.0000 mg | ORAL_TABLET | Freq: Two times a day (BID) | ORAL | 11 refills | Status: DC

## 2016-03-17 NOTE — Telephone Encounter (Signed)
[employer] patient assistance approved for Chantix through 03/15/17.   Ruta Hinds. Velva Harman, PharmD, BCPS, CPP Clinical Pharmacist Pager: [phone] Phone: [phone] 03/17/2016 10:04 AM

## 2016-03-20 NOTE — Telephone Encounter (Signed)
Pt's Chantix arrived from [employer], pt is aware and will come by office to pick up.  Instructions for taking:  0.5 mg daily for 3 days, then 0.5 mg BID for 4 days and then 1 tab Twice daily  Note w/intstructions left w/meds as well

## 2016-03-26 ENCOUNTER — Telehealth: Payer: Self-pay | Admitting: Cardiology

## 2016-03-26 ENCOUNTER — Ambulatory Visit (INDEPENDENT_AMBULATORY_CARE_PROVIDER_SITE_OTHER): Payer: Medicare Other | Admitting: *Deleted

## 2016-03-26 DIAGNOSIS — I255 Ischemic cardiomyopathy: Secondary | ICD-10-CM

## 2016-03-26 NOTE — Telephone Encounter (Signed)
Spoke with pt and reminded pt of remote transmission that is due today. Pt verbalized understanding.   

## 2016-03-27 NOTE — Progress Notes (Signed)
Remote ICD transmission.   

## 2016-03-28 ENCOUNTER — Encounter: Payer: Self-pay | Admitting: Cardiology

## 2016-03-28 LAB — CUP PACEART REMOTE DEVICE CHECK
Brady Statistic RA Percent Paced: 0 %
Date Time Interrogation Session: 20171228002200
HighPow Impedance: 65 Ohm
Implantable Lead Implant Date: 20101005
Implantable Lead Location: 753859
Implantable Lead Serial Number: 28628809
Implantable Pulse Generator Implant Date: 20101005
Lead Channel Impedance Value: 460 Ohm
Lead Channel Pacing Threshold Amplitude: 1.2 V
Lead Channel Pacing Threshold Pulse Width: 0.4 ms
MDC IDC LEAD IMPLANT DT: 20101005
MDC IDC LEAD LOCATION: 753860
MDC IDC LEAD MODEL: 158
MDC IDC LEAD MODEL: 4136
MDC IDC LEAD SERIAL: 300495
MDC IDC MSMT BATTERY REMAINING LONGEVITY: 54 mo
MDC IDC MSMT BATTERY REMAINING PERCENTAGE: 67 %
MDC IDC MSMT LEADCHNL RA IMPEDANCE VALUE: 583 Ohm
MDC IDC MSMT LEADCHNL RA PACING THRESHOLD AMPLITUDE: 0.7 V
MDC IDC MSMT LEADCHNL RV PACING THRESHOLD PULSEWIDTH: 0.4 ms
MDC IDC SET LEADCHNL RA PACING AMPLITUDE: 2 V
MDC IDC SET LEADCHNL RV PACING AMPLITUDE: 2.8 V
MDC IDC SET LEADCHNL RV PACING PULSEWIDTH: 0.4 ms
MDC IDC SET LEADCHNL RV SENSING SENSITIVITY: 0.6 mV
MDC IDC STAT BRADY RV PERCENT PACED: 0 %
Pulse Gen Serial Number: 152061

## 2016-04-01 ENCOUNTER — Encounter (HOSPITAL_COMMUNITY): Payer: Self-pay

## 2016-04-01 ENCOUNTER — Ambulatory Visit (HOSPITAL_COMMUNITY)
Admission: RE | Admit: 2016-04-01 | Discharge: 2016-04-01 | Disposition: A | Discharge status: Home / Self Care | Payer: Medicare Other | Source: Ambulatory Visit | Attending: Cardiology | Admitting: Cardiology

## 2016-04-01 VITALS — BP 124/82 | HR 84 | Wt 228.1 lb

## 2016-04-01 DIAGNOSIS — I5042 Chronic combined systolic (congestive) and diastolic (congestive) heart failure: Secondary | ICD-10-CM | POA: Diagnosis not present

## 2016-04-01 DIAGNOSIS — K819 Cholecystitis, unspecified: Secondary | ICD-10-CM | POA: Diagnosis not present

## 2016-04-01 DIAGNOSIS — Z9889 Other specified postprocedural states: Secondary | ICD-10-CM | POA: Insufficient documentation

## 2016-04-01 DIAGNOSIS — I5022 Chronic systolic (congestive) heart failure: Secondary | ICD-10-CM

## 2016-04-01 DIAGNOSIS — Z951 Presence of aortocoronary bypass graft: Secondary | ICD-10-CM | POA: Diagnosis not present

## 2016-04-01 DIAGNOSIS — Z7982 Long term (current) use of aspirin: Secondary | ICD-10-CM | POA: Diagnosis not present

## 2016-04-01 DIAGNOSIS — F17210 Nicotine dependence, cigarettes, uncomplicated: Secondary | ICD-10-CM | POA: Diagnosis not present

## 2016-04-01 DIAGNOSIS — I2510 Atherosclerotic heart disease of native coronary artery without angina pectoris: Secondary | ICD-10-CM | POA: Diagnosis not present

## 2016-04-01 DIAGNOSIS — I255 Ischemic cardiomyopathy: Secondary | ICD-10-CM | POA: Diagnosis not present

## 2016-04-01 DIAGNOSIS — I25812 Atherosclerosis of bypass graft of coronary artery of transplanted heart without angina pectoris: Secondary | ICD-10-CM

## 2016-04-01 DIAGNOSIS — Z79899 Other long term (current) drug therapy: Secondary | ICD-10-CM | POA: Diagnosis not present

## 2016-04-01 DIAGNOSIS — Z5189 Encounter for other specified aftercare: Secondary | ICD-10-CM | POA: Insufficient documentation

## 2016-04-01 LAB — BASIC METABOLIC PANEL
Anion gap: 6 (ref 5–15)
BUN: 10 mg/dL (ref 6–20)
CHLORIDE: 106 mmol/L (ref 101–111)
CO2: 27 mmol/L (ref 22–32)
CREATININE: 1.17 mg/dL (ref 0.61–1.24)
Calcium: 9.1 mg/dL (ref 8.9–10.3)
GFR calc Af Amer: >60 mL/min (ref >60)
GFR calc non Af Amer: >60 mL/min (ref >60)
Glucose, Bld: 70 mg/dL (ref 65–99)
POTASSIUM: 4.1 mmol/L (ref 3.5–5.1)
Sodium: 139 mmol/L (ref 135–145)

## 2016-04-01 LAB — LIPID PANEL
CHOL/HDL RATIO: 4.7 ratio
CHOLESTEROL: 147 mg/dL (ref 0–200)
HDL: 31 mg/dL — ABNORMAL LOW (ref >40)
LDL CALC: 97 mg/dL (ref 0–99)
Triglycerides: 97 mg/dL (ref <150)
VLDL: 19 mg/dL (ref 0–40)

## 2016-04-01 MED ORDER — CARVEDILOL 6.25 MG PO TABS
9.3750 mg | ORAL_TABLET | Freq: Two times a day (BID) | ORAL | 2 refills | Status: DC
Start: 2016-04-01 — End: 2016-05-23

## 2016-04-01 NOTE — Progress Notes (Signed)
CSW referred to assist with PCP and prescription assistance. Patient reports he has attempted to apply for Medicare D in the past although "it was too expensive". CSW discussed challenges with applying for Medicare D post open enrollment. Due to limited income patient may be qualified for Extra Help and CSW contacted with [facility] with patient to make application. Patient informed by [facility] that if he qualifies than he could enroll in Medicare D at that time. CSW also encouraged patient to apply for medicaid benefits at [employer]. Patient plans to make application in two weeks when he returns to [place]. Patient reports transportation issues due to distance [place] local public transportation. CSW explained transportation benefits with medicaid if approved. Patient also requesting assistance with obtaining a PCP. CSW referred patient to IM clinic. Patient verbalizes understanding of follow up and will return call to CSW if further assistance needed. CSW available as needed. Raquel Sarna, LCSW [phone]

## 2016-04-01 NOTE — Patient Instructions (Signed)
Routine lab work today. Will notify you of abnormal results, otherwise no news is good news!  INCREASE Carvedilol (Coreg) to 9.375 mg (1.5 tabs) twice daily.  Will refer you to [facility] for cholelithiasis.  Will refer you to Raquel Sarna Social Worker for primary care physician.  Follow up 2 weeks with Amy Clegg NP-C.  Do the following things EVERYDAY: 1) Weigh yourself in the morning before breakfast. Write it down and keep it in a log. 2) Take your medicines as prescribed 3) Eat low salt foods-Limit salt (sodium) to 2000 mg per day.  4) Stay as active as you can everyday 5) Limit all fluids for the day to less than 2 liters

## 2016-04-01 NOTE — Progress Notes (Signed)
Cardiology: Dr. Aundra Dubin PCP:  none.   41 yo with history of CAD s/p CABG, chronic systolic CHF (ischemic cardiomyopathy), and COPD presents for CHF clinic evaluation.  He had his first MI with occlusion of LAD in 10/9 treated with BMS.  He then had POBA of reocclusion of LAD stent in 6/10. Finally, he had sequential LIMA-D1 and D2 in 1/11.  NSTEMI in 6/17 (cocaine positive) showed 99% stenosis small distal LCx as likely culprit. This was medically managed. Echo in 6/17 showed EF 20% with wall motion abnormalities.   He was admitted in 12/17 with RUQ abdominal pain.  There was concern for acute cholecystitis based on CT of abdomen, but HIDA scan was negative. He left AMA.  He still has RUQ pain with eating, no pain in between meals.  He has, therefore, been eating very little.    He returns for HF follow up. Last visit entresto was increased to 49-51 mg twice a day. Complaining abdominal pain after eats eats.  Unable to eat due to nausea. Denies PND/Orthopnea. SOB with steps. No CP.  Taking all medications. Not weighing at home. Disabled since 2010. Lives alone. Smokes 3-4 cigarettes per day. He is unable to pay for medications.   ECG (12/17): NSR, IVCD 139 msec  Labs (6/17): K 3.8, creatinine 1.26, HCT 42.4, LDL 81, HDL 34  Labs (10/17): K 4, creatinine 1.42, LDL 122, BNP 475 Labs (12/17): K 4, creatinine 1.39, hgb 13.6  PMH: 1. GERD 2. OSA 3. H/o VT 4. COPD: Active smoker. 5. Chronic systolic CHF: Ischemic cardiomyopathy. Has [employer] ICD.  - Echo (6/17): EF 20%, wall motion abnormalities, mild MR - CPX (11/17): peak VO2 19.7 (51% predicted), VE/VCO2 slope 46.9, RER 0.92 => Submaximal, but probably moderate to severe functional impairment => suspect restrictive/obstructive lung disease and HF play a role in the abnormality, probably moderate impairment in terms of HF.  6. CAD: Acute MI in 10/09 with BMS to LAD - POBA to LAD stent in 6/10.  - CABG with sequential LIMA-D1 and D2 in  1/11. - NSTEMI 6/17: LHC with patent sequential LIMA-D1 and D2 with filling of LAD, occlusion proximal LAD, subtotal occlusion of distal LCx into small OM (likely culprit).  No PCI. He was cocaine positive.  7. Symptomatic cholelithiasis: Negative HIDA scan 12/17.   SH: Disabled, active smoker, history of cocaine use, no ETOH, lives in [place].  FH: Father with 1st MI in his 30s.  ROS: All systems reviewed and negative except as per HPI.   Current Outpatient Prescriptions  Medication Sig Dispense Refill  . aspirin EC 81 MG EC tablet Take 1 tablet (81 mg total) by mouth daily.    Marland Kitchen atorvastatin (LIPITOR) 40 MG tablet Take 1 tablet (40 mg total) by mouth daily. 30 tablet 2  . carvedilol (COREG) 6.25 MG tablet Take 1 tablet (6.25 mg total) by mouth 2 (two) times daily. 60 tablet 2  . Esomeprazole Magnesium (NEXIUM 24HR) 20 MG TBEC Take 20 mg by mouth daily.    . furosemide (LASIX) 40 MG tablet Take 1 tablet (40 mg total) by mouth daily. 30 tablet 2  . isosorbide mononitrate (IMDUR) 60 MG 24 hr tablet Take 1 tablet (60 mg total) by mouth daily. 30 tablet 2  . nitroGLYCERIN (NITROSTAT) 0.4 MG SL tablet Place 1 tablet (0.4 mg total) under the tongue every 5 (five) minutes as needed for chest pain. 100 tablet 0  . potassium chloride SA (K-DUR,KLOR-CON) 20 MEQ tablet Take 1  tablet (20 mEq total) by mouth daily. 30 tablet 2  . sacubitril-valsartan (ENTRESTO) 49-51 MG Take 1 tablet by mouth 2 (two) times daily. 60 tablet 11  . spironolactone (ALDACTONE) 25 MG tablet Take 0.5 tablets (12.5 mg total) by mouth daily. 15 tablet 2  . varenicline (CHANTIX CONTINUING MONTH PAK) 1 MG tablet Take 1 tablet (1 mg total) by mouth 2 (two) times daily. 60 tablet 11   No current facility-administered medications for this encounter.    BP 124/82 (BP Location: Right Arm, Patient Position: Sitting, Cuff Size: Normal)   Pulse 84   Wt 228 lb 2 oz (103.5 kg)   SpO2 100%   BMI 28.51 kg/m  General: NAD. Ambulated  in the clinic without difficulty.  Neck: JVP 5-6 cm, no thyromegaly or thyroid nodule.  Lungs: Clear to auscultation bilaterally with normal respiratory effort. CV: Nondisplaced PMI.  Heart regular S1/S2, no S3/S4, no murmur.  No peripheral edema.  No carotid bruit.  Normal pedal pulses.  Abdomen: Soft, no hepatosplenomegaly, no distention.  Mild RUQ tenderness.  Skin: Intact without lesions or rashes.  Neurologic: Alert and oriented x 3.  Psych: Normal affect. Extremities: No clubbing or cyanosis.  HEENT: Normal.   Assessment/Plan: 1. CAD: s/p CABG.  6/17 NSTEMI with subtotal occlusion small distal LCx managed medically.  No chest pain.  - Continue ASA 81.   - Continue statin. He is back on atorvastatin with high LDL on lovastatin.  Lipids/LFTs today .   2. Chronic systolic CHF: Ischemic CMP.  [place].  EF 20% on 6/17 echo.  NYHA class III symptoms but no longer volume overloaded on exam.  COPD likely also plays a role in his dyspnea. Submaximal CPX in 11/17 but suggestive of both heart and lung limitation => appears to have moderate limitation in terms of heart failure.  - Continue Entresto to 49/51 bid.  Continue Lasix to 40 mg daily. BMET today.    - Increase coreg to 9.375 mg twice a day.  - Continue spironolactone at current dose.   3. Active smoker: Encouraged to quit smoking. .  4. Symptomatic cholelithiasis: Significant RUQ pain with meals.  Refer to [facility]. He was referred in December but has not been notified. Refer again today.   'Referred to HFSW for PCP. I wonder if he would qualify for medicaid. He had no money for insurance.   Follow up in 2 wks.   Amy Clegg 04/01/2016

## 2016-04-01 NOTE — Addendum Note (Signed)
Encounter addended by: Louann Liv, LCSW on: 04/01/2016 10:03 AM<BR>    Actions taken: Pend clinical note

## 2016-04-02 ENCOUNTER — Telehealth: Payer: Self-pay | Admitting: Licensed Clinical Social Worker

## 2016-04-02 NOTE — Telephone Encounter (Signed)
CSW received return call from IM clinic with appointment for patient for PCP. Patient will be seen on April 15, 2016 @ 10:15am immediately following his HF clinic appointment. CSW contacted patient and message left to inform of appointment. Raquel Sarna, LCSW [phone] '

## 2016-04-15 ENCOUNTER — Ambulatory Visit (INDEPENDENT_AMBULATORY_CARE_PROVIDER_SITE_OTHER): Payer: Medicare Other | Admitting: Internal Medicine

## 2016-04-15 ENCOUNTER — Ambulatory Visit (HOSPITAL_COMMUNITY)
Admission: RE | Admit: 2016-04-15 | Discharge: 2016-04-15 | Disposition: A | Discharge status: Home / Self Care | Payer: Medicare Other | Source: Ambulatory Visit | Attending: Cardiology | Admitting: Cardiology

## 2016-04-15 ENCOUNTER — Encounter (HOSPITAL_COMMUNITY): Payer: Self-pay

## 2016-04-15 ENCOUNTER — Encounter: Payer: Self-pay | Admitting: Internal Medicine

## 2016-04-15 VITALS — BP 90/50 | HR 73 | Temp 97.5°F | Wt 225.3 lb

## 2016-04-15 VITALS — BP 96/54 | HR 85 | Wt 229.0 lb

## 2016-04-15 DIAGNOSIS — Z7982 Long term (current) use of aspirin: Secondary | ICD-10-CM | POA: Insufficient documentation

## 2016-04-15 DIAGNOSIS — J3489 Other specified disorders of nose and nasal sinuses: Secondary | ICD-10-CM

## 2016-04-15 DIAGNOSIS — R0602 Shortness of breath: Secondary | ICD-10-CM | POA: Insufficient documentation

## 2016-04-15 DIAGNOSIS — Z955 Presence of coronary angioplasty implant and graft: Secondary | ICD-10-CM

## 2016-04-15 DIAGNOSIS — I5022 Chronic systolic (congestive) heart failure: Secondary | ICD-10-CM | POA: Insufficient documentation

## 2016-04-15 DIAGNOSIS — R05 Cough: Secondary | ICD-10-CM | POA: Diagnosis not present

## 2016-04-15 DIAGNOSIS — F411 Generalized anxiety disorder: Secondary | ICD-10-CM | POA: Diagnosis not present

## 2016-04-15 DIAGNOSIS — Z95810 Presence of automatic (implantable) cardiac defibrillator: Secondary | ICD-10-CM

## 2016-04-15 DIAGNOSIS — I25812 Atherosclerosis of bypass graft of coronary artery of transplanted heart without angina pectoris: Secondary | ICD-10-CM

## 2016-04-15 DIAGNOSIS — I255 Ischemic cardiomyopathy: Secondary | ICD-10-CM | POA: Diagnosis not present

## 2016-04-15 DIAGNOSIS — I2510 Atherosclerotic heart disease of native coronary artery without angina pectoris: Secondary | ICD-10-CM

## 2016-04-15 DIAGNOSIS — F17210 Nicotine dependence, cigarettes, uncomplicated: Secondary | ICD-10-CM | POA: Insufficient documentation

## 2016-04-15 DIAGNOSIS — Z5189 Encounter for other specified aftercare: Secondary | ICD-10-CM | POA: Insufficient documentation

## 2016-04-15 DIAGNOSIS — R0981 Nasal congestion: Secondary | ICD-10-CM | POA: Diagnosis not present

## 2016-04-15 DIAGNOSIS — K8080 Other cholelithiasis without obstruction: Secondary | ICD-10-CM | POA: Diagnosis not present

## 2016-04-15 DIAGNOSIS — R058 Other specified cough: Secondary | ICD-10-CM

## 2016-04-15 DIAGNOSIS — J029 Acute pharyngitis, unspecified: Secondary | ICD-10-CM | POA: Diagnosis not present

## 2016-04-15 DIAGNOSIS — I5042 Chronic combined systolic (congestive) and diastolic (congestive) heart failure: Secondary | ICD-10-CM

## 2016-04-15 DIAGNOSIS — Z79899 Other long term (current) drug therapy: Secondary | ICD-10-CM | POA: Insufficient documentation

## 2016-04-15 DIAGNOSIS — Z825 Family history of asthma and other chronic lower respiratory diseases: Secondary | ICD-10-CM

## 2016-04-15 DIAGNOSIS — J208 Acute bronchitis due to other specified organisms: Secondary | ICD-10-CM

## 2016-04-15 DIAGNOSIS — Z9889 Other specified postprocedural states: Secondary | ICD-10-CM | POA: Insufficient documentation

## 2016-04-15 DIAGNOSIS — Z8249 Family history of ischemic heart disease and other diseases of the circulatory system: Secondary | ICD-10-CM

## 2016-04-15 DIAGNOSIS — Z951 Presence of aortocoronary bypass graft: Secondary | ICD-10-CM | POA: Insufficient documentation

## 2016-04-15 DIAGNOSIS — R1011 Right upper quadrant pain: Secondary | ICD-10-CM

## 2016-04-15 LAB — BASIC METABOLIC PANEL
ANION GAP: 9 (ref 5–15)
BUN: 11 mg/dL (ref 6–20)
CHLORIDE: 106 mmol/L (ref 101–111)
CO2: 23 mmol/L (ref 22–32)
Calcium: 9.2 mg/dL (ref 8.9–10.3)
Creatinine, Ser: 1.22 mg/dL (ref 0.61–1.24)
Glucose, Bld: 100 mg/dL — ABNORMAL HIGH (ref 65–99)
POTASSIUM: 3.9 mmol/L (ref 3.5–5.1)
SODIUM: 138 mmol/L (ref 135–145)

## 2016-04-15 LAB — CBC
HEMATOCRIT: 45.4 % (ref 39.0–52.0)
HEMOGLOBIN: 14.7 g/dL (ref 13.0–17.0)
MCH: 29.3 pg (ref 26.0–34.0)
MCHC: 32.4 g/dL (ref 30.0–36.0)
MCV: 90.6 fL (ref 78.0–100.0)
Platelets: 169 10*3/uL (ref 150–400)
RBC: 5.01 MIL/uL (ref 4.22–5.81)
RDW: 14 % (ref 11.5–15.5)
WBC: 9.1 10*3/uL (ref 4.0–10.5)

## 2016-04-15 LAB — BRAIN NATRIURETIC PEPTIDE: B Natriuretic Peptide: 218.2 pg/mL — ABNORMAL HIGH (ref 0.0–100.0)

## 2016-04-15 MED ORDER — GUAIFENESIN ER 600 MG PO TB12: 600.0000 mg | ORAL_TABLET | Freq: Two times a day (BID) | ORAL | 0 refills | Status: DC

## 2016-04-15 MED ORDER — SACUBITRIL-VALSARTAN 49-51 MG PO TABS: 1.0000 | ORAL_TABLET | Freq: Two times a day (BID) | ORAL | 3 refills | Status: DC

## 2016-04-15 MED FILL — SPIRONOLACTONE 25 MG TABLET: 25 | 30 days supply | Qty: 15 | Fill #1

## 2016-04-15 MED FILL — CARVEDILOL 6.25 MG TABLET: 6.25 | 30 days supply | Qty: 90 | Fill #0

## 2016-04-15 MED FILL — ISOSORBIDE MN ER 60 MG TAB: 60 | 30 days supply | Qty: 30 | Fill #1

## 2016-04-15 MED FILL — FUROSEMIDE 40 MG TABLET: 40 | 30 days supply | Qty: 30 | Fill #1

## 2016-04-15 MED FILL — ATORVASTATIN 40 MG TABLET: 40 | 30 days supply | Qty: 30 | Fill #1

## 2016-04-15 MED FILL — KLOR-CON M20 TABLET: 20 | 30 days supply | Qty: 30 | Fill #1

## 2016-04-15 NOTE — Progress Notes (Signed)
CC: Establish Care - Cough, Anxiety  HPI:  [name] is a 41 y.o. male with a past medical history significant for CAD s/p CABG and subsequent restenosis with stenting, ischemic cardiomyopathy with EF 20% s/p ICD placement, OSA, COPD, HLD, tobacco abuse, GERD presenting today to re-establish care. Reports he was established in the clinic 5-6 years ago.   He was admitted on 03/16/2016 with RUQ abdominal pain. There were concerns for acute cholecystitis based on CT abdomen but HIDA was negative. He left AMA form that hospitalization. He denies any abdominal pain today. Reports decreased appetite but no pain with meals. No nausea, vomiting, diarrhea.   He was seen by Heart Failure today, Dr. Aundra Dubin. He was noted to be dry at that visit. Reports on a good day he can walk about a block before getting short of breath. No orthopnea or PND. No leg swelling. No chest pain or palpations.   Reports being dizzy/lightheaded with standing. For the past several days. Appears he has been taking more of his blood pressure medication than prescribed.   Reports cough with green sputum production. No fevers, chills, nausea/vomiting, sinus congestion or pressure, shortness of breath, ear pain/hearing loss, headaches, myalgias. No recent sick contacts. Has not tried anything for the cough. Reports good PO intake but decreased appetite.  +sore throat in the mornings that improves during the day. +rhinorrhea.   Has complaints of anxiety today. Says it makes it difficult to focus on things and take care of his kid at home. He says he has been on Xanax in the past but stopped because he felt like he did not need it anymore. Life stressors have gotten worse lately and he believes he needs to go back on medication. GAD 7 is 17 today.  Past Medical History:  Diagnosis Date  . Anxiety state, unspecified   . Back pain   . Chronic systolic heart failure ([place])    a. Ischemic CM (EF 30% by echo in 2011) b.  echo 08/2015: EF 20% with severe HK and AK of the inferoseptal and apical myocardium.  . Coronary artery disease    a. 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  b. eventually required single vessel CABG (L-LAD) in 03/2009 due to restenosis. c. cath 2013 showed patent LIMA sequential to the diags with LAD filling retrograde, no new disease in RCA and Cx, low LVEDP, sx felt noncardiac. d. 08/2015: NSTEMI: CTO of prox LAD, 99% stenosis of small dCx (no stent), + for cocaine  . Esophageal reflux   . Heart attack   . Hypercholesteremia   . ICD (implantable cardioverter-defibrillator) infection ([place])    a. [employer] ICD 12/2008.   . Ischemic cardiomyopathy    echocardiogram 12/11: Mild LVH, EF 30-35%, anteroseptal and apical akinesis, grade 1 diastolic dysfunction  . LBP (low back pain)   . OSA (obstructive sleep apnea) 12/30/2010  . Paroxysmal ventricular tachycardia ([place])   . Tobacco abuse    Past Surgical History:  Procedure Laterality Date  . CARDIAC CATHETERIZATION N/A 09/06/2015   Procedure: Left Heart Cath and Cors/Grafts Angiography;  Surgeon: Sherren Mocha, MD;  Location: [place] [facility];  Service: Cardiovascular;  Laterality: N/A;  . CARDIAC DEFIBRILLATOR PLACEMENT    . CARDIAC DEFIBRILLATOR PLACEMENT     2010 by JA  . CORONARY ANGIOPLASTY WITH STENT PLACEMENT     LAD stenting 10/09 and re-opening 6/10  . CORONARY ARTERY BYPASS GRAFT  1/11  . LEFT HEART CATHETERIZATION WITH CORONARY ANGIOGRAM N/A 08/07/2011   Procedure: LEFT HEART CATHETERIZATION WITH CORONARY ANGIOGRAM;  Surgeon: Josue Hector, MD;  Location: [facility] C[facility] LAB;  Service: Cardiovascular;  Laterality: N/A;   Family History  Problem Relation Age of Onset  . Heart attack Father 63    died at 9 after multiple MI's  . COPD Maternal Grandmother    Social History   Social History  . Marital status: Single    Spouse name: N/A  . Number of children: 1  . Years of education:  N/A   Occupational History  . disabled Unemployed   Social History Main Topics  . Smoking status: Current Every Day Smoker    Packs/day: 0.30    Years: 17.00    Types: Cigarettes  . Smokeless tobacco: Former Systems developer    Types: Chew  . Alcohol use No  . Drug use: No     Comment: REMOTE H/O SUBSTANCE ABUSE  . Sexual activity: Not Asked   Other Topics Concern  . None   Social History Narrative   Pt lives with his mom, brother, and 43 yo son in [place].   Quit tobacco in Jan. 2011 prior to heart surgery (prior -1/2 PPD (down from 1 ppd per day, onset age 20)    alcohol abuse- prior, hasn't drank since age 46   no illicit drug use    Currently unemployed, awaiting disability   Single          Review of Systems:   Review of Systems  Constitutional: Negative for chills, fever and malaise/fatigue.  HENT: Negative for congestion, ear discharge, ear pain, hearing loss, sinus pain and sore throat.   Eyes: Negative for blurred vision and double vision.  Respiratory: Positive for cough and sputum production. Negative for hemoptysis, shortness of breath and wheezing.   Cardiovascular: Negative for chest pain, palpitations, orthopnea, leg swelling and PND.  Gastrointestinal: Negative for abdominal pain, blood in stool, constipation, diarrhea, melena, nausea and vomiting.  Genitourinary: Negative for dysuria.  Musculoskeletal: Negative for myalgias.  Skin: Negative for rash.  Neurological: Positive for dizziness. Negative for headaches.  Psychiatric/Behavioral: Negative for depression, substance abuse and suicidal ideas. The patient is nervous/anxious. The patient does not have insomnia.    Physical Exam:  Vitals:   04/15/16 1039  BP: (!) 90/50  Pulse: 73  Temp: 97.5 F (36.4 C)  TempSrc: Oral  SpO2: 99%  Weight: 225 lb 4.8 oz (102.2 kg)   Physical Exam  Constitutional: He is oriented to person, place, and time and well-developed, well-nourished, and in no distress. No  distress.  HENT:  Head: Normocephalic and atraumatic.  Mouth/Throat: Oropharynx is clear and moist. No oropharyngeal exudate.  Eyes: Conjunctivae are normal. Pupils are equal, round, and reactive to light.  Cardiovascular: Normal rate, regular rhythm and normal heart sounds.  Exam reveals no gallop and no friction rub.   No murmur heard. Pulmonary/Chest: Effort normal and breath sounds normal. He has no wheezes. He has no rales.  Abdominal: Soft. Bowel sounds are normal. He exhibits no distension. There is no tenderness. There is no rebound and no guarding.  Musculoskeletal: Normal range of motion. He exhibits no edema.  Lymphadenopathy:    He has no cervical adenopathy.  Neurological: He is alert and oriented to person, place, and time.  Skin: Skin is warm and dry. No rash noted. No erythema.  Psychiatric: Mood and affect normal.    Assessment & Plan:  See Encounters Tab for problem based charting.  Patient discussed with Dr. Eppie Gibson.

## 2016-04-15 NOTE — Patient Instructions (Signed)
John Davenport,  It was a pleasure meeting you today.  I am going to send in a prescription for Mucinex for you to help with the congestion and cough. This should run its course and is most likely a viral bronchitis.   For your anxiety, I am going to get you started on an anti-anxiety medication called Zoloft. It can take 4-6 weeks before you start to notice any improvement as it builds in your system.  Please follow up with the heart doctors next month.   I would like to see you back in 6-8 weeks for follow up or sooner if you have any problems before then.

## 2016-04-15 NOTE — Progress Notes (Signed)
Cardiology: Dr. Aundra Dubin PCP:    41 yo with history of CAD s/p CABG, chronic systolic CHF (ischemic cardiomyopathy), and COPD presents for CHF clinic evaluation.  He had his first MI with occlusion of LAD in 10/9 treated with BMS.  He then had POBA of reocclusion of LAD stent in 6/10. Finally, he had sequential LIMA-D1 and D2 in 1/11.  NSTEMI in 6/17 (cocaine positive) showed 99% stenosis small distal LCx as likely culprit. This was medically managed. Echo in 6/17 showed EF 20% with wall motion abnormalities.   He was admitted in 12/17 with RUQ abdominal pain.  There was concern for acute cholecystitis based on CT of abdomen, but HIDA scan was negative. He left AMA.  He still has RUQ pain with eating, no pain in between meals.  He has, therefore, been eating very little.    He returns for HF follow up. Last visit carvedilol was increased to 9.375 mg twice a day. He increased carvedilol to 9.375 mg twice a day and he also increased entresto to 1 1/2 tablets per day. Complaining nasal congestion. SOB with exertion. Dizzy when standing. Weight at home 225 pounds. Appetite poor . No fever. Productive cough. His mom fixes his pill box. Taking all medications.  Disabled since 2010. Lives alone. Smokes 3 cigarettes per day. He is unable to pay for medications.   ECG (12/17): NSR, IVCD 139 msec  Labs (6/17): K 3.8, creatinine 1.26, HCT 42.4, LDL 81, HDL 34  Labs (10/17): K 4, creatinine 1.42, LDL 122, BNP 475 Labs (12/17): K 4, creatinine 1.39, hgb 13.6  PMH: 1. GERD 2. OSA 3. H/o VT 4. COPD: Active smoker. 5. Chronic systolic CHF: Ischemic cardiomyopathy. Has [employer] ICD.  - Echo (6/17): EF 20%, wall motion abnormalities, mild MR - CPX (11/17): peak VO2 19.7 (51% predicted), VE/VCO2 slope 46.9, RER 0.92 => Submaximal, but probably moderate to severe functional impairment => suspect restrictive/obstructive lung disease and HF play a role in the abnormality, probably moderate impairment in terms  of HF.  6. CAD: Acute MI in 10/09 with BMS to LAD - POBA to LAD stent in 6/10.  - CABG with sequential LIMA-D1 and D2 in 1/11. - NSTEMI 6/17: LHC with patent sequential LIMA-D1 and D2 with filling of LAD, occlusion proximal LAD, subtotal occlusion of distal LCx into small OM (likely culprit).  No PCI. He was cocaine positive.  7. Symptomatic cholelithiasis: Negative HIDA scan 12/17.   SH: Disabled, active smoker, history of cocaine use, no ETOH, lives in [place].  FH: Father with 1st MI in his 35s.  ROS: All systems reviewed and negative except as per HPI.   Current Outpatient Prescriptions  Medication Sig Dispense Refill  . aspirin EC 81 MG EC tablet Take 1 tablet (81 mg total) by mouth daily.    Marland Kitchen atorvastatin (LIPITOR) 40 MG tablet Take 1 tablet (40 mg total) by mouth daily. 30 tablet 2  . carvedilol (COREG) 6.25 MG tablet Take 1.5 tablets (9.375 mg total) by mouth 2 (two) times daily. 90 tablet 2  . Esomeprazole Magnesium (NEXIUM 24HR) 20 MG TBEC Take 20 mg by mouth daily.    . furosemide (LASIX) 40 MG tablet Take 20 mg by mouth.    . isosorbide mononitrate (IMDUR) 60 MG 24 hr tablet Take 1 tablet (60 mg total) by mouth daily. 30 tablet 2  . nitroGLYCERIN (NITROSTAT) 0.4 MG SL tablet Place 1 tablet (0.4 mg total) under the tongue every 5 (five) minutes as needed  for chest pain. 100 tablet 0  . potassium chloride SA (K-DUR,KLOR-CON) 20 MEQ tablet Take 1 tablet (20 mEq total) by mouth daily. 30 tablet 2  . sacubitril-valsartan (ENTRESTO) 49-51 MG Take 1.5 tablets by mouth 2 (two) times daily.    Marland Kitchen spironolactone (ALDACTONE) 25 MG tablet Take 0.5 tablets (12.5 mg total) by mouth daily. 15 tablet 2  . varenicline (CHANTIX CONTINUING MONTH PAK) 1 MG tablet Take 1 tablet (1 mg total) by mouth 2 (two) times daily. 60 tablet 11   No current facility-administered medications for this encounter.    BP (!) 96/54 (BP Location: Left Arm, Patient Position: Sitting, Cuff Size: Large)   Pulse 85    Wt 229 lb (103.9 kg)   SpO2 96%   BMI 28.62 kg/m  General: NAD. Walked in the clinic.   Neck: JVP flat, no thyromegaly or thyroid nodule.  Lungs: CTAB. On room air.  CV: Nondisplaced PMI.  Regular S1/S2, no S3, no murmur.  No lower extremity edema. No carotid bruit.   Abdomen: Soft, no hepatosplenomegaly, no distention.  Mild RUQ tenderness. .  Skin: Intact without lesions or rashes.  Neurologic: [place] and oriented x3  Psych: Normal affect. Extremities: No clubbing or cyanosis. Warm HEENT: Normal.   Assessment/Plan: 1. CAD: s/p CABG.  6/17 NSTEMI with subtotal occlusion small distal LCx managed medically.  No chest pain.  - Continue ASA 81.   - Continue statin. He is back on atorvastatin with high LDL on lovastatin.  .   2. Chronic systolic CHF: Ischemic CMP.  [place].  EF 20% on 6/17 echo.  COPD likely also plays a role in his dyspnea. Submaximal CPX in 11/17 but suggestive of both heart and lung limitation => appears to have moderate limitation in terms of heart failure.  -NYHA II-III. Volume status low. I asked him to hold lasix for 2 days.  - Today I have asked him to cut back entresto to 49-51 mg twice a day. I have asked him to bring all medications to his next visit.  - Continue oreg to 9.375 mg twice a day.  - Continue spironolactone at current dose.   3. Active smoker: Encouraged to quit smok ing. He declines smoking cessation.  4. Symptomatic cholelithiasis: Significant RUQ pain with meals. He has follow up with CCS tomorrow.   5. ? Viral Bronchitis-He has follow up with PCP today. Check CBC today.   Check BMET, CBC, BNP. Refer to Paramedicine. I have asked him to make and give the med sheet to his mom to verify dosages.   Follow up in 4 wks.   Amy Clegg NP-C  04/15/2016 NP-C

## 2016-04-15 NOTE — Patient Instructions (Addendum)
Take Entresto 49/51 mg: 1 tablet twice daily.  Routine lab work today. Will notify you of abnormal results, otherwise no news is good news!  HOLD Lasix for two days, then resume as normal.  Follow up 4 weeks with Amy Clegg NP-C.  Do the following things EVERYDAY: 1) Weigh yourself in the morning before breakfast. Write it down and keep it in a log. 2) Take your medicines as prescribed 3) Eat low salt foods-Limit salt (sodium) to 2000 mg per day.  4) Stay as active as you can everyday 5) Limit all fluids for the day to less than 2 liters

## 2016-04-15 NOTE — Addendum Note (Signed)
Encounter addended by: Louann Liv, LCSW on: 04/15/2016 10:31 AM<BR>    Actions taken: Sign clinical note

## 2016-04-15 NOTE — Progress Notes (Signed)
CSW well known to patient who has been seen by CSW in the past. Patient reports he received paperwork from social security regarding application completed on last visit for extra help. Patient states "the letter said to just apply for prescription plan" although he was unsure whether or not he was approved for extra help. Patient will text/fax letter to CSW when possible. CSW will review letter and assist further is possible. Patient admits to struggling with understanding of heart failure and medications. Patient agreeable to referral to paramedicine program to assist with education and improved compliance with medical regimen. CSW referred patient to IM Clinic for primary care visit today and assisted patient to clinic for scheduled appointment. CSW will coordinate care with paramedicine program and clinic staff. Raquel Sarna, LCSW [phone]

## 2016-04-17 DIAGNOSIS — R058 Other specified cough: Secondary | ICD-10-CM | POA: Insufficient documentation

## 2016-04-17 DIAGNOSIS — R05 Cough: Secondary | ICD-10-CM | POA: Insufficient documentation

## 2016-04-17 NOTE — Assessment & Plan Note (Signed)
Reports cough with green sputum production. No fevers, chills, nausea/vomiting, sinus congestion or pressure, shortness of breath, ear pain/hearing loss, headaches, myalgias. No recent sick contacts. Has not tried anything for the cough. Reports good PO intake but decreased appetite.  +sore throat in the mornings that improves during the day. +rhinorrhea.   Plan Likely viral URI. Will treat symptomatically with Mucinex.

## 2016-04-17 NOTE — Assessment & Plan Note (Signed)
History of CAD s/p CABG and subsequent restenosis with stenting, ischemic cardiomyopathy with EF 20% s/p ICD placement.  He was seen by Heart Failure today, Dr. Aundra Dubin. He was noted to be dry at that visit. Reports on a good day he can walk about a block before getting short of breath. No orthopnea or PND. No leg swelling. No chest pain or palpations.   Reports being dizzy/lightheaded with standing. For the past several days. Appears he has been taking more of his blood pressure medication than prescribed.   Currently on ASA 81 mg daily, Spironolactone 25 mg daily, Entresto 49-51 mg bid, imdur 60 mg daily and lasix 40 mg daily, coreg 9,375 mg daily.  A/P Had his Entresto decreased from 1.5 pills bid (increased on his own) back to 1 pill bid at HF clinic. Continued his current medications. Will defer to HF clinic. Has follow up in 1 month.

## 2016-04-17 NOTE — Assessment & Plan Note (Signed)
He was admitted on 03/16/2016 with RUQ abdominal pain. There were concerns for acute cholecystitis based on CT abdomen but HIDA was negative. He left AMA form that hospitalization. He denies any abdominal pain today. Reports decreased appetite but no pain with meals. No nausea, vomiting, diarrhea.   A/P Resolved. Abdominal exam today with no tenderness. Negative Murphy's sign.

## 2016-04-17 NOTE — Assessment & Plan Note (Addendum)
Has complaints of anxiety today. Says it makes it difficult to focus on things and take care of his kid at home. He says he has been on Xanax in the past but stopped because he felt like he did not need it anymore. Life stressors have gotten worse lately and he believes he needs to go back on medication. GAD 7 is 17 today.  A/P After discussing his anxiety with him I discussed treatment options with him, including SSRIs. At this point he became agitated and said that he 'had been on all that and tried everything before but the only thing that helped was Xanax.' I explained to him that Xanax was not indicated for generalized anxiety and that I could not offer this medication to him today. At this point he stood up, put his jack on and said 'Okay, that's all I needed to know. I'll get someone else to prescribe it for me.' He the promptly exited the clinic before I had the chance to give him any paper work.

## 2016-04-18 NOTE — Progress Notes (Signed)
Case discussed with Dr. Charlynn Grimes at the time of the visit.  We reviewed the resident's history and exam and pertinent patient test results.  I agree with the assessment, diagnosis and plan of care documented in the resident's note.  Agree that benzos are not first line therapy for this gentleman's anxiety without trying other safer options first.

## 2016-05-12 ENCOUNTER — Encounter (HOSPITAL_COMMUNITY): Payer: Medicare Other

## 2016-05-21 ENCOUNTER — Inpatient Hospital Stay (HOSPITAL_COMMUNITY): Payer: Medicare Other

## 2016-05-21 ENCOUNTER — Emergency Department (HOSPITAL_COMMUNITY): Payer: Medicare Other

## 2016-05-21 ENCOUNTER — Inpatient Hospital Stay (HOSPITAL_COMMUNITY): Payer: Medicare Other | Admitting: Anesthesiology

## 2016-05-21 ENCOUNTER — Inpatient Hospital Stay (HOSPITAL_COMMUNITY)
Admission: EM | Admit: 2016-05-21 | Discharge: 2016-05-23 | DRG: 023 | Disposition: A | Discharge status: Home / Self Care | Payer: Medicare Other | Attending: Neurology | Admitting: Neurology

## 2016-05-21 ENCOUNTER — Encounter (HOSPITAL_COMMUNITY): Payer: Self-pay | Admitting: *Deleted

## 2016-05-21 ENCOUNTER — Encounter (HOSPITAL_COMMUNITY)
Admission: EM | Disposition: A | Discharge status: Home / Self Care | Payer: Self-pay | Source: Home / Self Care | Attending: Neurology

## 2016-05-21 DIAGNOSIS — N183 Chronic kidney disease, stage 3 unspecified: Secondary | ICD-10-CM | POA: Diagnosis present

## 2016-05-21 DIAGNOSIS — Z951 Presence of aortocoronary bypass graft: Secondary | ICD-10-CM

## 2016-05-21 DIAGNOSIS — I517 Cardiomegaly: Secondary | ICD-10-CM | POA: Diagnosis not present

## 2016-05-21 DIAGNOSIS — G9389 Other specified disorders of brain: Secondary | ICD-10-CM | POA: Diagnosis present

## 2016-05-21 DIAGNOSIS — I130 Hypertensive heart and chronic kidney disease with heart failure and stage 1 through stage 4 chronic kidney disease, or unspecified chronic kidney disease: Secondary | ICD-10-CM | POA: Diagnosis present

## 2016-05-21 DIAGNOSIS — F1410 Cocaine abuse, uncomplicated: Secondary | ICD-10-CM | POA: Diagnosis present

## 2016-05-21 DIAGNOSIS — I63312 Cerebral infarction due to thrombosis of left middle cerebral artery: Secondary | ICD-10-CM | POA: Diagnosis not present

## 2016-05-21 DIAGNOSIS — I6602 Occlusion and stenosis of left middle cerebral artery: Secondary | ICD-10-CM | POA: Diagnosis not present

## 2016-05-21 DIAGNOSIS — I255 Ischemic cardiomyopathy: Secondary | ICD-10-CM | POA: Diagnosis present

## 2016-05-21 DIAGNOSIS — K219 Gastro-esophageal reflux disease without esophagitis: Secondary | ICD-10-CM | POA: Diagnosis not present

## 2016-05-21 DIAGNOSIS — J9690 Respiratory failure, unspecified, unspecified whether with hypoxia or hypercapnia: Secondary | ICD-10-CM | POA: Diagnosis not present

## 2016-05-21 DIAGNOSIS — I6789 Other cerebrovascular disease: Secondary | ICD-10-CM | POA: Diagnosis not present

## 2016-05-21 DIAGNOSIS — R471 Dysarthria and anarthria: Secondary | ICD-10-CM | POA: Diagnosis not present

## 2016-05-21 DIAGNOSIS — G9340 Encephalopathy, unspecified: Secondary | ICD-10-CM | POA: Diagnosis present

## 2016-05-21 DIAGNOSIS — G4733 Obstructive sleep apnea (adult) (pediatric): Secondary | ICD-10-CM | POA: Diagnosis not present

## 2016-05-21 DIAGNOSIS — R4701 Aphasia: Secondary | ICD-10-CM | POA: Diagnosis not present

## 2016-05-21 DIAGNOSIS — I5022 Chronic systolic (congestive) heart failure: Secondary | ICD-10-CM | POA: Diagnosis not present

## 2016-05-21 DIAGNOSIS — H5010 Unspecified exotropia: Secondary | ICD-10-CM | POA: Diagnosis present

## 2016-05-21 DIAGNOSIS — N1831 Chronic kidney disease, stage 3a: Secondary | ICD-10-CM | POA: Diagnosis present

## 2016-05-21 DIAGNOSIS — E7800 Pure hypercholesterolemia, unspecified: Secondary | ICD-10-CM | POA: Diagnosis present

## 2016-05-21 DIAGNOSIS — R531 Weakness: Secondary | ICD-10-CM | POA: Diagnosis present

## 2016-05-21 DIAGNOSIS — I2510 Atherosclerotic heart disease of native coronary artery without angina pectoris: Secondary | ICD-10-CM | POA: Diagnosis present

## 2016-05-21 DIAGNOSIS — Z85118 Personal history of other malignant neoplasm of bronchus and lung: Secondary | ICD-10-CM

## 2016-05-21 DIAGNOSIS — R4702 Dysphasia: Secondary | ICD-10-CM | POA: Diagnosis present

## 2016-05-21 DIAGNOSIS — I252 Old myocardial infarction: Secondary | ICD-10-CM | POA: Diagnosis not present

## 2016-05-21 DIAGNOSIS — H5021 Vertical strabismus, right eye: Secondary | ICD-10-CM | POA: Diagnosis present

## 2016-05-21 DIAGNOSIS — R451 Restlessness and agitation: Secondary | ICD-10-CM | POA: Diagnosis not present

## 2016-05-21 DIAGNOSIS — J449 Chronic obstructive pulmonary disease, unspecified: Secondary | ICD-10-CM | POA: Diagnosis present

## 2016-05-21 DIAGNOSIS — E785 Hyperlipidemia, unspecified: Secondary | ICD-10-CM | POA: Diagnosis not present

## 2016-05-21 DIAGNOSIS — Z95810 Presence of automatic (implantable) cardiac defibrillator: Secondary | ICD-10-CM

## 2016-05-21 DIAGNOSIS — Z955 Presence of coronary angioplasty implant and graft: Secondary | ICD-10-CM | POA: Diagnosis not present

## 2016-05-21 DIAGNOSIS — I959 Hypotension, unspecified: Secondary | ICD-10-CM | POA: Diagnosis not present

## 2016-05-21 DIAGNOSIS — Z4659 Encounter for fitting and adjustment of other gastrointestinal appliance and device: Secondary | ICD-10-CM

## 2016-05-21 DIAGNOSIS — I472 Ventricular tachycardia, unspecified: Secondary | ICD-10-CM

## 2016-05-21 DIAGNOSIS — F17210 Nicotine dependence, cigarettes, uncomplicated: Secondary | ICD-10-CM | POA: Diagnosis present

## 2016-05-21 DIAGNOSIS — R29703 NIHSS score 3: Secondary | ICD-10-CM | POA: Diagnosis present

## 2016-05-21 DIAGNOSIS — G8191 Hemiplegia, unspecified affecting right dominant side: Secondary | ICD-10-CM | POA: Diagnosis not present

## 2016-05-21 DIAGNOSIS — R29810 Facial weakness: Secondary | ICD-10-CM | POA: Diagnosis present

## 2016-05-21 DIAGNOSIS — Z4682 Encounter for fitting and adjustment of non-vascular catheter: Secondary | ICD-10-CM | POA: Diagnosis not present

## 2016-05-21 DIAGNOSIS — Z8673 Personal history of transient ischemic attack (TIA), and cerebral infarction without residual deficits: Secondary | ICD-10-CM | POA: Diagnosis present

## 2016-05-21 DIAGNOSIS — I639 Cerebral infarction, unspecified: Secondary | ICD-10-CM

## 2016-05-21 DIAGNOSIS — F411 Generalized anxiety disorder: Secondary | ICD-10-CM | POA: Diagnosis present

## 2016-05-21 DIAGNOSIS — Q251 Coarctation of aorta: Secondary | ICD-10-CM

## 2016-05-21 DIAGNOSIS — Z720 Tobacco use: Secondary | ICD-10-CM | POA: Diagnosis present

## 2016-05-21 DIAGNOSIS — I4729 Other ventricular tachycardia: Secondary | ICD-10-CM

## 2016-05-21 DIAGNOSIS — J9601 Acute respiratory failure with hypoxia: Secondary | ICD-10-CM | POA: Diagnosis not present

## 2016-05-21 DIAGNOSIS — Z79899 Other long term (current) drug therapy: Secondary | ICD-10-CM

## 2016-05-21 DIAGNOSIS — N179 Acute kidney failure, unspecified: Secondary | ICD-10-CM | POA: Diagnosis not present

## 2016-05-21 DIAGNOSIS — Z7982 Long term (current) use of aspirin: Secondary | ICD-10-CM

## 2016-05-21 HISTORY — PX: IR GENERIC HISTORICAL: IMG1180011

## 2016-05-21 HISTORY — DX: Malignant (primary) neoplasm, unspecified: C80.1

## 2016-05-21 HISTORY — PX: RADIOLOGY WITH ANESTHESIA: SHX6223

## 2016-05-21 LAB — URINALYSIS, ROUTINE W REFLEX MICROSCOPIC
Bilirubin Urine: NEGATIVE
GLUCOSE, UA: NEGATIVE mg/dL
Hgb urine dipstick: NEGATIVE
Ketones, ur: NEGATIVE mg/dL
LEUKOCYTES UA: NEGATIVE
Nitrite: NEGATIVE
PROTEIN: NEGATIVE mg/dL
SPECIFIC GRAVITY, URINE: 1.026 (ref 1.005–1.030)
pH: 5 (ref 5.0–8.0)

## 2016-05-21 LAB — RAPID URINE DRUG SCREEN, HOSP PERFORMED
AMPHETAMINES: NOT DETECTED
Barbiturates: NOT DETECTED
Benzodiazepines: NOT DETECTED
COCAINE: POSITIVE — AB
OPIATES: POSITIVE — AB
TETRAHYDROCANNABINOL: NOT DETECTED

## 2016-05-21 LAB — CBC
HEMATOCRIT: 46.2 % (ref 39.0–52.0)
HEMOGLOBIN: 15.1 g/dL (ref 13.0–17.0)
MCH: 29.4 pg (ref 26.0–34.0)
MCHC: 32.7 g/dL (ref 30.0–36.0)
MCV: 90.1 fL (ref 78.0–100.0)
Platelets: 186 10*3/uL (ref 150–400)
RBC: 5.13 MIL/uL (ref 4.22–5.81)
RDW: 13.9 % (ref 11.5–15.5)
WBC: 11.3 10*3/uL — AB (ref 4.0–10.5)

## 2016-05-21 LAB — I-STAT CHEM 8, ED
BUN: 14 mg/dL (ref 6–20)
CHLORIDE: 103 mmol/L (ref 101–111)
CREATININE: 1.4 mg/dL — AB (ref 0.61–1.24)
Calcium, Ion: 1.18 mmol/L (ref 1.15–1.40)
Glucose, Bld: 99 mg/dL (ref 65–99)
HEMATOCRIT: 47 % (ref 39.0–52.0)
HEMOGLOBIN: 16 g/dL (ref 13.0–17.0)
POTASSIUM: 4.4 mmol/L (ref 3.5–5.1)
Sodium: 141 mmol/L (ref 135–145)
TCO2: 27 mmol/L (ref 0–100)

## 2016-05-21 LAB — DIFFERENTIAL
BASOS ABS: 0 10*3/uL (ref 0.0–0.1)
BASOS PCT: 0 %
EOS ABS: 0.3 10*3/uL (ref 0.0–0.7)
Eosinophils Relative: 3 %
Lymphocytes Relative: 26 %
Lymphs Abs: 3 10*3/uL (ref 0.7–4.0)
Monocytes Absolute: 0.6 10*3/uL (ref 0.1–1.0)
Monocytes Relative: 5 %
NEUTROS ABS: 7.4 10*3/uL (ref 1.7–7.7)
NEUTROS PCT: 66 %

## 2016-05-21 LAB — POCT I-STAT 3, ART BLOOD GAS (G3+)
Bicarbonate: 25 mmol/L (ref 20.0–28.0)
O2 SAT: 100 %
Patient temperature: 98.7
TCO2: 26 mmol/L (ref 0–100)
pCO2 arterial: 41.7 mmHg (ref 32.0–48.0)
pH, Arterial: 7.386 (ref 7.350–7.450)
pO2, Arterial: 204 mmHg — ABNORMAL HIGH (ref 83.0–108.0)

## 2016-05-21 LAB — COMPREHENSIVE METABOLIC PANEL
ALBUMIN: 3.9 g/dL (ref 3.5–5.0)
ALT: 19 U/L (ref 17–63)
AST: 21 U/L (ref 15–41)
Alkaline Phosphatase: 57 U/L (ref 38–126)
Anion gap: 7 (ref 5–15)
BUN: 11 mg/dL (ref 6–20)
CHLORIDE: 106 mmol/L (ref 101–111)
CO2: 27 mmol/L (ref 22–32)
CREATININE: 1.37 mg/dL — AB (ref 0.61–1.24)
Calcium: 9.5 mg/dL (ref 8.9–10.3)
GFR calc Af Amer: >60 mL/min (ref >60)
GFR calc non Af Amer: >60 mL/min (ref >60)
GLUCOSE: 99 mg/dL (ref 65–99)
POTASSIUM: 4.7 mmol/L (ref 3.5–5.1)
Sodium: 140 mmol/L (ref 135–145)
Total Bilirubin: 0.5 mg/dL (ref 0.3–1.2)
Total Protein: 7.2 g/dL (ref 6.5–8.1)

## 2016-05-21 LAB — APTT: APTT: 30 s (ref 24–36)

## 2016-05-21 LAB — PROTIME-INR
INR: 0.95
Prothrombin Time: 12.6 seconds (ref 11.4–15.2)

## 2016-05-21 LAB — I-STAT TROPONIN, ED: TROPONIN I, POC: 0 ng/mL (ref 0.00–0.08)

## 2016-05-21 LAB — TRIGLYCERIDES: Triglycerides: 119 mg/dL (ref <150)

## 2016-05-21 SURGERY — RADIOLOGY WITH ANESTHESIA
Anesthesia: General

## 2016-05-21 MED ORDER — SODIUM CHLORIDE 0.9 % IV SOLN
INTRAVENOUS | Status: DC
  Administered 2016-05-21: 22:00:00 via INTRAVENOUS

## 2016-05-21 MED ORDER — PHENYLEPHRINE 40 MCG/ML (10ML) SYRINGE FOR IV PUSH (FOR BLOOD PRESSURE SUPPORT)
PREFILLED_SYRINGE | INTRAVENOUS | Status: AC
  Filled 2016-05-21: qty 10

## 2016-05-21 MED ORDER — ACETAMINOPHEN 650 MG RE SUPP: 650.0000 mg | RECTAL | Status: DC | PRN

## 2016-05-21 MED ORDER — EPHEDRINE 5 MG/ML INJ
INTRAVENOUS | Status: AC
  Filled 2016-05-21: qty 10

## 2016-05-21 MED ORDER — SUFENTANIL CITRATE 50 MCG/ML IV SOLN
INTRAVENOUS | Status: AC
  Filled 2016-05-21: qty 1

## 2016-05-21 MED ORDER — PROPOFOL 10 MG/ML IV BOLUS
INTRAVENOUS | Status: AC
  Filled 2016-05-21: qty 20

## 2016-05-21 MED ORDER — ATORVASTATIN CALCIUM 40 MG PO TABS: 40.0000 mg | ORAL_TABLET | Freq: Every day | ORAL | Status: DC

## 2016-05-21 MED ORDER — LABETALOL HCL 5 MG/ML IV SOLN: 20.0000 mg | Freq: Once | INTRAVENOUS | Status: DC

## 2016-05-21 MED ORDER — ACETAMINOPHEN 160 MG/5ML PO SOLN: 650.0000 mg | ORAL | Status: DC | PRN

## 2016-05-21 MED ORDER — EPHEDRINE SULFATE 50 MG/ML IJ SOLN
INTRAMUSCULAR | Status: DC | PRN
  Administered 2016-05-21 (×3): 5 mg via INTRAVENOUS

## 2016-05-21 MED ORDER — SUFENTANIL CITRATE 50 MCG/ML IV SOLN
INTRAVENOUS | Status: DC | PRN
Start: 2016-05-21 — End: 2016-05-21

## 2016-05-21 MED ORDER — STROKE: EARLY STAGES OF RECOVERY BOOK
Freq: Once | Status: AC
  Administered 2016-05-22: 1
  Filled 2016-05-21 (×2): qty 1

## 2016-05-21 MED ORDER — EPTIFIBATIDE 20 MG/10ML IV SOLN
INTRAVENOUS | Status: AC
  Filled 2016-05-21: qty 10

## 2016-05-21 MED ORDER — PROPOFOL 500 MG/50ML IV EMUL
INTRAVENOUS | Status: DC | PRN
  Administered 2016-05-21: 25 ug/kg/min via INTRAVENOUS

## 2016-05-21 MED ORDER — ROCURONIUM BROMIDE 100 MG/10ML IV SOLN
INTRAVENOUS | Status: DC | PRN
  Administered 2016-05-21: 25 mg via INTRAVENOUS
  Administered 2016-05-21: 50 mg via INTRAVENOUS

## 2016-05-21 MED ORDER — CEFAZOLIN SODIUM-DEXTROSE 2-4 GM/100ML-% IV SOLN
INTRAVENOUS | Status: AC
  Filled 2016-05-21: qty 100

## 2016-05-21 MED ORDER — LIDOCAINE HCL 1 % IJ SOLN
INTRAMUSCULAR | Status: AC
  Filled 2016-05-21: qty 20

## 2016-05-21 MED ORDER — NITROGLYCERIN 1 MG/10 ML FOR IR/CATH LAB
INTRA_ARTERIAL | Status: AC
  Filled 2016-05-21: qty 10

## 2016-05-21 MED ORDER — ACETAMINOPHEN 325 MG PO TABS: 650.0000 mg | ORAL_TABLET | ORAL | Status: DC | PRN

## 2016-05-21 MED ORDER — NICOTINE 14 MG/24HR TD PT24
14.0000 mg | MEDICATED_PATCH | TRANSDERMAL | Status: DC
  Administered 2016-05-22 (×2): 14 mg via TRANSDERMAL
  Filled 2016-05-21 (×2): qty 1

## 2016-05-21 MED ORDER — CEFAZOLIN SODIUM-DEXTROSE 2-3 GM-% IV SOLR
INTRAVENOUS | Status: DC | PRN
Start: 2016-05-21 — End: 2016-05-21
  Administered 2016-05-21: 2 g via INTRAVENOUS

## 2016-05-21 MED ORDER — PANTOPRAZOLE SODIUM 40 MG IV SOLR
40.0000 mg | Freq: Every day | INTRAVENOUS | Status: DC
  Administered 2016-05-22 (×2): 40 mg via INTRAVENOUS
  Filled 2016-05-21 (×2): qty 40

## 2016-05-21 MED ORDER — NICARDIPINE HCL IN NACL 20-0.86 MG/200ML-% IV SOLN: 0.0000 mg/h | INTRAVENOUS | Status: DC

## 2016-05-21 MED ORDER — METOPROLOL TARTRATE 5 MG/5ML IV SOLN
INTRAVENOUS | Status: AC
  Filled 2016-05-21: qty 5

## 2016-05-21 MED ORDER — LACTATED RINGERS IV SOLN
INTRAVENOUS | Status: DC | PRN
  Administered 2016-05-21: 19:00:00 via INTRAVENOUS

## 2016-05-21 MED ORDER — ETOMIDATE 2 MG/ML IV SOLN
INTRAVENOUS | Status: DC | PRN
  Administered 2016-05-21: 20 mg via INTRAVENOUS

## 2016-05-21 MED ORDER — SUCCINYLCHOLINE CHLORIDE 20 MG/ML IJ SOLN
INTRAMUSCULAR | Status: DC | PRN
  Administered 2016-05-21: 100 mg via INTRAVENOUS

## 2016-05-21 MED ORDER — SODIUM CHLORIDE 0.9 % IJ SOLN
INTRAMUSCULAR | Status: AC
  Filled 2016-05-21: qty 10

## 2016-05-21 MED ORDER — PROPOFOL 1000 MG/100ML IV EMUL
0.0000 ug/kg/min | INTRAVENOUS | Status: DC
Start: 2016-05-21 — End: 2016-05-22
  Administered 2016-05-21: 50 ug/kg/min via INTRAVENOUS
  Administered 2016-05-22: 40 ug/kg/min via INTRAVENOUS
  Administered 2016-05-22: 35 ug/kg/min via INTRAVENOUS
  Administered 2016-05-22: 40 ug/kg/min via INTRAVENOUS
  Filled 2016-05-21 (×6): qty 100

## 2016-05-21 MED ORDER — FENTANYL CITRATE (PF) 100 MCG/2ML IJ SOLN
INTRAMUSCULAR | Status: AC
  Filled 2016-05-21: qty 2

## 2016-05-21 MED ORDER — LIDOCAINE HCL (CARDIAC) 20 MG/ML IV SOLN
INTRAVENOUS | Status: DC | PRN
  Administered 2016-05-21: 100 mg via INTRATRACHEAL

## 2016-05-21 MED ORDER — SUFENTANIL CITRATE 50 MCG/ML IV SOLN
INTRAVENOUS | Status: DC | PRN
  Administered 2016-05-21 (×2): 10 ug via INTRAVENOUS

## 2016-05-21 MED ORDER — FENTANYL CITRATE (PF) 100 MCG/2ML IJ SOLN
100.0000 ug | INTRAMUSCULAR | Status: DC | PRN
  Administered 2016-05-22 (×2): 100 ug via INTRAVENOUS
  Filled 2016-05-21: qty 2

## 2016-05-21 MED ORDER — SODIUM CHLORIDE 0.9 % IV SOLN
INTRAVENOUS | Status: DC | PRN
  Administered 2016-05-21: 19:00:00 via INTRAVENOUS

## 2016-05-21 MED ORDER — IOPAMIDOL (ISOVUE-300) INJECTION 61%
INTRAVENOUS | Status: AC
  Administered 2016-05-21: 70 mL
  Filled 2016-05-21: qty 300

## 2016-05-21 MED ORDER — CLEVIDIPINE BUTYRATE 0.5 MG/ML IV EMUL: 0.0000 mg/h | INTRAVENOUS | Status: DC

## 2016-05-21 MED ORDER — SUCCINYLCHOLINE CHLORIDE 200 MG/10ML IV SOSY
PREFILLED_SYRINGE | INTRAVENOUS | Status: AC
  Filled 2016-05-21: qty 10

## 2016-05-21 MED ORDER — FENTANYL CITRATE (PF) 100 MCG/2ML IJ SOLN
100.0000 ug | INTRAMUSCULAR | Status: DC | PRN
  Filled 2016-05-21 (×2): qty 2

## 2016-05-21 MED ORDER — MIDAZOLAM HCL 2 MG/2ML IJ SOLN
INTRAMUSCULAR | Status: DC | PRN
  Administered 2016-05-21: 2 mg via INTRAVENOUS

## 2016-05-21 MED ORDER — ONDANSETRON HCL 4 MG/2ML IJ SOLN
4.0000 mg | Freq: Four times a day (QID) | INTRAMUSCULAR | Status: DC | PRN
  Administered 2016-05-22: 4 mg via INTRAVENOUS
  Filled 2016-05-21: qty 2

## 2016-05-21 MED ORDER — ASPIRIN 81 MG PO CHEW
81.0000 mg | CHEWABLE_TABLET | Freq: Every day | ORAL | Status: DC
  Administered 2016-05-22 (×2): 81 mg
  Filled 2016-05-21 (×2): qty 1

## 2016-05-21 MED ORDER — MIDAZOLAM HCL 2 MG/2ML IJ SOLN
INTRAMUSCULAR | Status: AC
Start: 2016-05-21 — End: 2016-05-21
  Filled 2016-05-21: qty 2

## 2016-05-21 MED ORDER — LIDOCAINE 2% (20 MG/ML) 5 ML SYRINGE
INTRAMUSCULAR | Status: AC
  Filled 2016-05-21: qty 5

## 2016-05-21 MED ORDER — SENNOSIDES-DOCUSATE SODIUM 8.6-50 MG PO TABS
1.0000 | ORAL_TABLET | Freq: Every evening | ORAL | Status: DC | PRN
  Filled 2016-05-21: qty 1

## 2016-05-21 MED ORDER — PHENYLEPHRINE HCL 10 MG/ML IJ SOLN
INTRAVENOUS | Status: DC | PRN
  Administered 2016-05-21: 15 ug/min via INTRAVENOUS

## 2016-05-21 MED ORDER — ROCURONIUM BROMIDE 50 MG/5ML IV SOSY
PREFILLED_SYRINGE | INTRAVENOUS | Status: AC
Start: 2016-05-21 — End: 2016-05-21
  Filled 2016-05-21: qty 5

## 2016-05-21 MED ORDER — IOPAMIDOL (ISOVUE-370) INJECTION 76%
INTRAVENOUS | Status: AC
  Administered 2016-05-21: 50 mL
  Filled 2016-05-21: qty 50

## 2016-05-21 MED ORDER — ALTEPLASE (STROKE) FULL DOSE INFUSION
0.9000 mg/kg | Freq: Once | INTRAVENOUS | Status: AC
  Administered 2016-05-21: 87 mg via INTRAVENOUS
  Filled 2016-05-21: qty 100

## 2016-05-21 MED ORDER — SODIUM CHLORIDE 0.9 % IV SOLN: INTRAVENOUS | Status: DC

## 2016-05-21 NOTE — Anesthesia Postprocedure Evaluation (Signed)
Anesthesia Post Note  Patient: John Davenport  Procedure(s) Performed: Procedure(s) (LRB): RADIOLOGY WITH ANESTHESIA (N/A)  Patient location during evaluation: SICU Anesthesia Type: General Level of consciousness: sedated Pain management: pain level controlled Vital Signs Assessment: post-procedure vital signs reviewed and stable Respiratory status: patient remains intubated per anesthesia plan Cardiovascular status: stable Anesthetic complications: no       Last Vitals:  Vitals:   05/21/16 1906 05/21/16 1916  BP: 115/88   Pulse: 63 64  Resp: 16   Temp: 37.1 C     Last Pain:  Vitals:   05/21/16 1845  PainSc: 7                  Catalina Gravel

## 2016-05-21 NOTE — ED Notes (Signed)
Pt has pulses marked on feet and has been shaved bilaterally in the groin

## 2016-05-21 NOTE — ED Notes (Signed)
Pt brought back to room. Pt sat up and pt symptoms returned. laid pt back down and pt returned to only slightly slurred speech and mild right sided facial drop.

## 2016-05-21 NOTE — Code Documentation (Signed)
41 year old male presents to [facility] via [place] as code stroke.   Met at bridge by stroke team.  EMS reports he had sudden onset right side weakness with right facial droop.  They report his right side was flacid on arrival at scene with slurred speech.  On arrival to ED patient was alert - oriented following commands - some right side facial droop noted - arms and legs without drift or weakness.  Some mild dysarthria.  CT scan was done. NIHHS 2.  CTA done.  Patient with mild symptoms - taken back to the ED - in ED he continued to ask what happened - without memory of weakness - he suddenly set up on the edge of the bed where he quickly became less talkative - increased right side droop with some aphasia and dysarthria - right side weakness - he was returned to a supine position and NS bolus started where his symptoms became better.  Dr. Shon Hale at bedside - he quickly went to radiology to read CTA - this writer had to leave urgently for code blue situation - handoff to [facility] with instructions to start prep for IR - 2nd IV line and foley.

## 2016-05-21 NOTE — ED Provider Notes (Addendum)
[facility] DEPT Provider Note   CSN: [id number] Arrival date & time: 05/21/16  1802     History   Chief Complaint Chief Complaint  Patient presents with  . Code Stroke    HPI John Davenport is a 41 y.o. male.  New onset AMS with right sided weakness.  Patient arrived as code stroke.  Complaining of headache as well.  Some initial improvement of symptoms.    The history is provided by the patient and medical records.  Cerebrovascular Accident  This is a new problem. The current episode started 1 to 2 hours ago. The problem occurs constantly. The problem has been gradually improving. Pertinent negatives include no chest pain, no abdominal pain, no headaches and no shortness of breath. Nothing aggravates the symptoms. Nothing relieves the symptoms. He has tried nothing for the symptoms.    Past Medical History:  Diagnosis Date  . Anxiety state, unspecified   . Back pain   . Cancer ([place])    Lung CA  . Chronic systolic heart failure ([place])    a. Ischemic CM (EF 30% by echo in 2011) b. echo 08/2015: EF 20% with severe HK and AK of the inferoseptal and apical myocardium.  . Coronary artery disease    a. 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  b. eventually required single vessel CABG (L-LAD) in 03/2009 due to restenosis. c. cath 2013 showed patent LIMA sequential to the diags with LAD filling retrograde, no new disease in RCA and Cx, low LVEDP, sx felt noncardiac. d. 08/2015: NSTEMI: CTO of prox LAD, 99% stenosis of small dCx (no stent), + for cocaine  . Esophageal reflux   . Heart attack   . Hypercholesteremia   . ICD (implantable cardioverter-defibrillator) infection ([place])    a. [place] [employer] ICD 12/2008.   . Ischemic cardiomyopathy    echocardiogram 12/11: Mild LVH, EF 30-35%, anteroseptal and apical akinesis, grade 1 diastolic dysfunction  . LBP (low back pain)   . OSA (obstructive sleep apnea) 12/30/2010  . Paroxysmal  ventricular tachycardia ([place])   . Tobacco abuse     Patient Active Problem List   Diagnosis Date Noted  . Cough with sputum 04/17/2016  . Intractable upper abdominal pain 03/09/2016  . CKD (chronic kidney disease), stage III 03/09/2016  . 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 RUQ abdominal pain 03/09/2016  . NSTEMI (non-ST elevated myocardial infarction) ([place]) 09/05/2015  . Ischemic cardiomyopathy 09/05/2015  . Tobacco abuse 09/05/2015  . Hyperlipidemia 09/05/2015  . Renal insufficiency 09/05/2015  . OSA (obstructive sleep apnea) 12/30/2010  . Cough 12/09/2010  . Dental abscess 12/09/2010  . GERD 04/10/2010  . PAROXYSMAL VENTRICULAR TACHYCARDIA 03/22/2010  . Chronic combined systolic and diastolic CHF (congestive heart failure) ([place]) 03/22/2010  . Generalized anxiety disorder 05/16/2009  . MYOCARDIAL INFARCTION 12/19/2008  . CAD in native artery 12/19/2008    Past Surgical History:  Procedure Laterality Date  . CARDIAC CATHETERIZATION N/A 09/06/2015   Procedure: Left Heart Cath and Cors/Grafts Angiography;  Surgeon: Sherren Mocha, MD;  Location: [facility];  Service: Cardiovascular;  Laterality: N/A;  . CARDIAC DEFIBRILLATOR PLACEMENT    . CARDIAC DEFIBRILLATOR PLACEMENT     2010 by JA  . CORONARY ANGIOPLASTY WITH STENT PLACEMENT     LAD stenting 10/09 and re-opening 6/10  . CORONARY ARTERY BYPASS GRAFT     1/11  . LEFT HEART CATHETERIZATION WITH CORONARY ANGIOGRAM N/A 08/07/2011   Procedure:  LEFT HEART CATHETERIZATION WITH CORONARY ANGIOGRAM;  Surgeon: Josue Hector, MD;  Location: [facility] CATH LAB;  Service: Cardiovascular;  Laterality: N/A;       Home Medications    Prior to Admission medications   Medication Sig Start Date End Date Taking? Authorizing Provider  aspirin EC 81 MG EC tablet Take 1 tablet (81 mg total) by mouth daily. 09/07/15   Erma Heritage, PA  atorvastatin (LIPITOR) 40 MG tablet Take 1 tablet (40 mg total) by mouth daily. 03/11/16    Larey Dresser, MD  carvedilol (COREG) 6.25 MG tablet Take 1.5 tablets (9.375 mg total) by mouth 2 (two) times daily. 04/01/16   Amy D Clegg, NP  Esomeprazole Magnesium (NEXIUM 24HR) 20 MG TBEC Take 20 mg by mouth daily.    Historical Provider, MD  furosemide (LASIX) 40 MG tablet Take 20 mg by mouth.    Historical Provider, MD  guaiFENesin (MUCINEX) 600 MG 12 hr tablet Take 1 tablet (600 mg total) by mouth 2 (two) times daily. 04/15/16   Maryellen Pile, MD  isosorbide mononitrate (IMDUR) 60 MG 24 hr tablet Take 1 tablet (60 mg total) by mouth daily. 03/11/16 03/11/17  Larey Dresser, MD  KLOR-CON M20 20 MEQ tablet Take 20 mEq by mouth daily. 04/15/16   Historical Provider, MD  nitroGLYCERIN (NITROSTAT) 0.4 MG SL tablet Place 1 tablet (0.4 mg total) under the tongue every 5 (five) minutes as needed for chest pain. 09/24/15   Brittainy M Simmons, PA-C  sacubitril-valsartan (ENTRESTO) 49-51 MG Take 1 tablet by mouth 2 (two) times daily. 04/15/16   Amy D Ninfa Meeker, NP  spironolactone (ALDACTONE) 25 MG tablet Take 0.5 tablets (12.5 mg total) by mouth daily. 03/11/16 06/09/16  Larey Dresser, MD  varenicline (CHANTIX CONTINUING MONTH PAK) 1 MG tablet Take 1 tablet (1 mg total) by mouth 2 (two) times daily. 03/17/16   Larey Dresser, MD    Family History Family History  Problem Relation Age of Onset  . Heart attack Father 62    died at 31 after multiple MI's  . COPD Maternal Grandmother     Social History Social History  Substance Use Topics  . Smoking status: Current Every Day Smoker    Packs/day: 0.30    Years: 20.00    Types: Cigarettes  . Smokeless tobacco: Former Systems developer    Types: Chew  . Alcohol use No     Allergies   Patient has no known allergies.   Review of Systems Review of Systems  Constitutional: Negative for chills and fever.  HENT: Negative for congestion and facial swelling.   Eyes: Negative for discharge and visual disturbance.  Respiratory: Negative for shortness of  breath.   Cardiovascular: Negative for chest pain and palpitations.  Gastrointestinal: Negative for abdominal pain, diarrhea and vomiting.  Musculoskeletal: Negative for arthralgias and myalgias.  Skin: Negative for color change and rash.  Neurological: Positive for weakness (right sided). Negative for tremors, syncope and headaches.  Psychiatric/Behavioral: Negative for confusion and dysphoric mood.     Physical Exam Updated Vital Signs BP 107/92   Pulse 66   Resp 12   Wt 213 lb 13.5 oz (97 kg)   SpO2 99%   BMI 26.73 kg/m   Physical Exam  Constitutional: He is oriented to person, place, and time. He appears well-developed and well-nourished.  HENT:  Head: Normocephalic and atraumatic.  Eyes: EOM are normal. Pupils are equal, round, and reactive to light.  Neck: Normal range of  motion. Neck supple. No JVD present.  Cardiovascular: Normal rate and regular rhythm.  Exam reveals no gallop and no friction rub.   No murmur heard. Pulmonary/Chest: No respiratory distress. He has no wheezes.  Abdominal: He exhibits no distension and no mass. There is no tenderness. There is no rebound and no guarding.  Musculoskeletal: He exhibits no deformity.  Neurological: He is alert and oriented to person, place, and time.  Skin: No rash noted. No pallor.  Psychiatric: He has a normal mood and affect. His behavior is normal.  Nursing note and vitals reviewed.    ED Treatments / Results  Labs (all labs ordered are listed, but only abnormal results are displayed) Labs Reviewed  CBC - Abnormal; Notable for the following:       Result Value   WBC 11.3 (*)    All other components within normal limits  COMPREHENSIVE METABOLIC PANEL - Abnormal; Notable for the following:    Creatinine, Ser 1.37 (*)    All other components within normal limits  I-STAT CHEM 8, ED - Abnormal; Notable for the following:    Creatinine, Ser 1.40 (*)    All other components within normal limits  PROTIME-INR    DIFFERENTIAL  APTT  URINALYSIS, ROUTINE W REFLEX MICROSCOPIC  I-STAT TROPOININ, ED  CBG MONITORING, ED    EKG  EKG Interpretation  Date/Time:  Wednesday May 21 2016 18:31:21 EST Ventricular Rate:  72 PR Interval:    QRS Duration: 141 QT Interval:  383 QTC Calculation: 420 R Axis:   -91 Text Interpretation:  Sinus rhythm LAE, consider biatrial enlargement Nonspecific IVCD with LAD No significant change since last tracing Confirmed by Maleki Hippe MD, DANIEL [phone]) on 05/21/2016 6:35:52 PM       Radiology Ct Head Code Stroke W/o Cm  Result Date: 05/21/2016 CLINICAL DATA:  Code stroke.  Right-sided deficits. EXAM: CT HEAD WITHOUT CONTRAST TECHNIQUE: Contiguous axial images were obtained from the base of the skull through the vertex without intravenous contrast. COMPARISON:  None. FINDINGS: Brain: No evidence of acute infarction, hemorrhage, hydrocephalus, extra-axial collection or mass lesion/mass effect. Right inferior cerebellar encephalomalacia probably representing sequelae of chronic infarction. Vascular: No hyperdense vessel or unexpected calcification. Skull: Normal. Negative for fracture or focal lesion. Sinuses/Orbits: No acute finding. Other: None. ASPECTS [facility] Stroke Program Early CT Score) - Ganglionic level infarction (caudate, lentiform nuclei, internal capsule, insula, M1-M3 cortex): 7 - Supraganglionic infarction (M4-M6 cortex): 3 Total score (0-10 with 10 being normal): 10 IMPRESSION: 1. No acute intracranial abnormality identified. 2. Right inferior cerebellar encephalomalacia, probably representing sequelae of chronic infarction. 3. ASPECTS is 10 These results were called by telephone at the time of interpretation on 05/21/2016 at 6:19 pm to Dr. Tyrone Nine who verbally acknowledged these results. Electronically Signed   By: Kristine Garbe M.D.   On: 05/21/2016 18:24    Procedures Procedures (including critical care time)  Medications Ordered in ED Medications   alteplase (ACTIVASE) 1 mg/mL infusion 87 mg (not administered)  iopamidol (ISOVUE-370) 76 % injection (50 mLs  Contrast Given 05/21/16 1816)     Initial Impression / Assessment and Plan / ED Course  I have reviewed the triage vital signs and the nursing notes.  Pertinent labs & imaging results that were available during my care of the patient were reviewed by me and considered in my medical decision making (see chart for details).     41 yo M With acute strokelike symptoms. Initially seemed to improve but when he sat up  they worsened. CT head was negative. There was a M2 distribution stroke seen on CT angiogram of the head. He was given TPA and taken to the IR suite.  CRITICAL CARE Performed by: Cecilio Asper   Total critical care time: 30 minutes  Critical care time was exclusive of separately billable procedures and treating other patients.  Critical care was necessary to treat or prevent imminent or life-threatening deterioration.  Critical care was time spent personally by me on the following activities: development of treatment plan with patient and/or surrogate as well as nursing, discussions with consultants, evaluation of patient's response to treatment, examination of patient, obtaining history from patient or surrogate, ordering and performing treatments and interventions, ordering and review of laboratory studies, ordering and review of radiographic studies, pulse oximetry and re-evaluation of patient's condition.  The patients results and plan were reviewed and discussed.   Any x-rays performed were independently reviewed by myself.   Differential diagnosis were considered with the presenting HPI.  Medications  alteplase (ACTIVASE) 1 mg/mL infusion 87 mg (not administered)  iopamidol (ISOVUE-370) 76 % injection (50 mLs  Contrast Given 05/21/16 1816)    Vitals:   05/21/16 1823 05/21/16 1826 05/21/16 1830 05/21/16 1845  BP:  120/76 99/75 107/92  Pulse:   70 66   Resp:   14 12  SpO2:   99% 99%  Weight: 213 lb 13.5 oz (97 kg)       Final diagnoses:  Acute ischemic stroke (HCC)  Acute ischemic stroke [facility])    Admission/ observation were discussed with the admitting physician, patient and/or family and they are comfortable with the plan.   Final Clinical Impressions(s) / ED Diagnoses   Final diagnoses:  Acute ischemic stroke ([place])  Acute ischemic stroke [facility])    New Prescriptions New Prescriptions   No medications on file     Deno Etienne, DO 05/21/16 [place], DO 05/21/16 1857

## 2016-05-21 NOTE — Procedures (Signed)
S/P Lt common carotid arteriogram,followed by complete revascularization  of occluded Lt MCA M1 segment with x 1 pass with 32mm x 40 mm solitaire FR retrieval device achieving a TICI 3 reperfusion.

## 2016-05-21 NOTE — Anesthesia Preprocedure Evaluation (Signed)
Anesthesia Evaluation  Patient identified by MRN, date of birth, ID band Patient unresponsive    Reviewed: Allergy & Precautions, NPO status , Patient's Chart, lab work & pertinent test resultsPreop documentation limited or incomplete due to emergent nature of procedure.  Airway        Dental   Pulmonary sleep apnea , Current Smoker,           Cardiovascular + CAD, + Past MI, + Cardiac Stents, + CABG and +CHF  + Cardiac Defibrillator   Echo 6/17: Study Conclusions  - Left ventricle: The cavity size was severely dilated. Systolic   function was severely reduced. The estimated ejection fraction   was 20%. Severe diffuse hypokinesis with distinct regional wall   motion abnormalities. There is akinesis of the entireinferoseptal   and apical myocardium. There is akinesis of the apicalanterior,   lateral, and inferior myocardium. There is akinesis of the   basal-midanteroseptal myocardium. Features are consistent with a   pseudonormal left ventricular filling pattern, with concomitant   abnormal relaxation and increased filling pressure (grade 2   diastolic dysfunction). - Mitral valve: There was mild regurgitation. - Left atrium: The atrium was mildly dilated. - Tricuspid valve: There was trivial regurgitation.   Neuro/Psych PSYCHIATRIC DISORDERS Anxiety CVA    GI/Hepatic Neg liver ROS, GERD  ,  Endo/Other  negative endocrine ROS  Renal/GU Renal InsufficiencyRenal disease     Musculoskeletal negative musculoskeletal ROS (+)   Abdominal   Peds  Hematology negative hematology ROS (+)   Anesthesia Other Findings Day of surgery medications reviewed with the patient.  Reproductive/Obstetrics                             Anesthesia Physical Anesthesia Plan  ASA: IV and emergent  Anesthesia Plan: General   Post-op Pain Management:    Induction: Intravenous  Airway Management Planned: Oral  ETT  Additional Equipment:   Intra-op Plan:   Post-operative Plan: Post-operative intubation/ventilation  Informed Consent:   Dental advisory given  Plan Discussed with: CRNA  Anesthesia Plan Comments: (Emergency surgery)        Anesthesia Quick Evaluation

## 2016-05-21 NOTE — Consult Note (Signed)
PULMONARY / CRITICAL CARE MEDICINE   Name: John Davenport MRN: [id number] DOB: 1975-09-23    ADMISSION DATE:  05/21/2016 CONSULTATION DATE:  05/21/16  REFERRING MD:  Shon Hale  CHIEF COMPLAINT:  Dysphasia  HISTORY OF PRESENT ILLNESS:  Pt is encephelopathic; therefore, this HPI is obtained from chart review. John Davenport is a 41 y.o. 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  He was brought to [facility] ED 02/21 with sudden onset right sided weakness and dysphasia.  In ED, he had CTA head showed proximal occlusion of the left M2 segment.  He received tPA and was then taken to IR for revascularization.  He returned to the ICU on the vent and PCCM was consulted for vent management.  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  PAST SURGICAL HISTORY: He  has a past surgical history that includes Cardiac defibrillator placement; Coronary artery bypass graft; Coronary angioplasty with stent; Cardiac defibrillator placement; left heart catheterization with coronary angiogram (N/A, 08/07/2011); and Cardiac catheterization (N/A, 09/06/2015).  No Known Allergies  No current facility-administered medications on file prior to encounter.    Current Outpatient Prescriptions on File Prior to Encounter  Medication Sig  . aspirin EC 81 MG EC tablet Take 1 tablet (81 mg total) by mouth daily.  Marland Kitchen atorvastatin (LIPITOR) 40 MG tablet Take 1 tablet (40 mg total) by mouth daily.  . carvedilol (COREG) 6.25 MG tablet Take 1.5 tablets (9.375 mg total)  by mouth 2 (two) times daily.  . Esomeprazole Magnesium (NEXIUM 24HR) 20 MG TBEC Take 20 mg by mouth daily.  . furosemide (LASIX) 40 MG tablet Take 20 mg by mouth.  Marland Kitchen guaiFENesin (MUCINEX) 600 MG 12 hr tablet Take 1 tablet (600 mg total) by mouth 2 (two) times daily.  . isosorbide mononitrate (IMDUR) 60 MG 24 hr tablet Take 1 tablet (60 mg total) by mouth daily.  . nitroGLYCERIN (NITROSTAT) 0.4 MG SL tablet Place 1 tablet (0.4 mg total) under the tongue every 5 (five) minutes as needed for chest pain.  . sacubitril-valsartan (ENTRESTO) 49-51 MG Take 1 tablet by mouth 2 (two) times daily.  Marland Kitchen spironolactone (ALDACTONE) 25 MG tablet Take 0.5 tablets (12.5 mg total) by mouth daily.  . varenicline (CHANTIX CONTINUING MONTH PAK) 1 MG tablet Take 1 tablet (1 mg total) by mouth 2 (two) times daily.    FAMILY HISTORY:  His indicated that his father is deceased. He indicated that the status of his maternal grandmother is unknown.    SOCIAL HISTORY: He  reports that he has been smoking Cigarettes.  He has a 6.00 pack-year smoking history. He has quit using smokeless tobacco. His smokeless tobacco use included Chew. He reports that he does not drink alcohol or use drugs.  REVIEW OF SYSTEMS:   Unable to obtain as pt is encephalopathic.  SUBJECTIVE:  On vent, unresponsive.  VITAL SIGNS: BP 115/88   Pulse 64   Temp 98.7 F (37.1 C)   Resp 16   Ht 6\' 2"  (1.88 m)   Wt 97 kg (213 lb 13.5 oz)   SpO2 100%   BMI 27.46 kg/m   HEMODYNAMICS:  VENTILATOR SETTINGS: Vent Mode: PRVC FiO2 (%):  [50 %] 50 % Set Rate:  [16 bmp] 16 bmp Vt Set:  [660 mL] 660 mL PEEP:  [5 cmH20] 5 cmH20 Plateau Pressure:  [19 cmH20] 19 cmH20  INTAKE / OUTPUT: No intake/output data recorded.   PHYSICAL EXAMINATION: General: Young caucasian male, in NAD. Neuro: Sedated, not responsive. HEENT: [place]/AT. PERRL, sclerae anicteric. Cardiovascular: RRR, no M/R/G.  Lungs: Respirations even and unlabored.  CTA bilaterally,  No W/R/R.  Abdomen: BS x 4, soft, NT/ND.  Musculoskeletal: No gross deformities, no edema.  Skin: Intact, warm, no rashes.  LABS:  BMET  Recent Labs Lab 05/21/16 1804 05/21/16 1809  NA 140 141  K 4.7 4.4  CL 106 103  CO2 27  --   BUN 11 14  CREATININE 1.37* 1.40*  GLUCOSE 99 99    Electrolytes  Recent Labs Lab 05/21/16 1804  CALCIUM 9.5    CBC  Recent Labs Lab 05/21/16 1804 05/21/16 1809  WBC 11.3*  --   HGB 15.1 16.0  HCT 46.2 47.0  PLT 186  --     Coag's  Recent Labs Lab 05/21/16 1804  APTT 30  INR 0.95    Sepsis Markers No results for input(s): LATICACIDVEN, PROCALCITON, O2SATVEN in the last 168 hours.  ABG No results for input(s): PHART, PCO2ART, PO2ART in the last 168 hours.  Liver Enzymes  Recent Labs Lab 05/21/16 1804  AST 21  ALT 19  ALKPHOS 57  BILITOT 0.5  ALBUMIN 3.9    Cardiac Enzymes No results for input(s): TROPONINI, PROBNP in the last 168 hours.  Glucose No results for input(s): GLUCAP in the last 168 hours.  Imaging Ct Angio Head W Or Wo Contrast  Result Date: 05/21/2016 CLINICAL DATA:  Acute facial droop, right-sided deficits. Prior CT from earlier the same day. EXAM: CT ANGIOGRAPHY HEAD AND NECK TECHNIQUE: Multidetector CT imaging of the head and neck was performed using the standard protocol during bolus administration of intravenous contrast. Multiplanar CT image reconstructions and MIPs were obtained to evaluate the vascular anatomy. Carotid stenosis measurements (when applicable) are obtained utilizing NASCET criteria, using the distal internal carotid diameter as the denominator. CONTRAST:  50 cc of Isovue 370.  Initial evaluation for COMPARISON:  None. FINDINGS: CTA NECK FINDINGS Aortic arch: Visualized aortic arch of normal caliber with normal 3 vessel morphology. No high-grade stenosis seen about the origin of the great vessels. Visualized subclavian arteries widely patent. Right carotid system: Right common  carotid artery patent from its origin to the bifurcation. Mild a centric calcified plaque about the right bifurcation without significant stenosis. Right ICA patent distally to the skullbase without significant stenosis, dissection, or vascular occlusion. Left carotid system: Left common carotid artery patent from its origin to the bifurcation. No significant atheromatous narrowing about the left bifurcation. Left ICA patent from the bifurcation to the skullbase without stenosis, dissection, or occlusion. Vertebral arteries: Both of the vertebral arteries arise from the subclavian arteries. Vertebral arteries widely patent within the neck without stenosis, dissection, or occlusion. Skeleton: Visualized osseous structures within normal limits. No acute osseous abnormality. No worrisome lytic or blastic osseous lesions. Other neck: Visualized soft tissues of the neck demonstrate no acute abnormality. Thyroid normal. Salivary glands normal. No adenopathy. Upper chest: Visualized mediastinum within normal limits. Visualized lungs are clear. Mild emphysema noted. Review of the MIP images confirms the above findings CTA HEAD FINDINGS Anterior circulation: Petrous, cavernous, and supraclinoid segments of the internal carotid arteries are widely  patent bilaterally without flow-limiting stenosis. Focal plaque noted at the supraclinoid right ICA with secondary mild stenosis. A1 segments patent. Right A1 segment mildly hypoplastic. Anterior communicating artery normal. Anterior cerebral arteries patent to their distal aspects. Proximal and mid left M1 segment widely patent. Proximal takeoff of the anterior division of the left MCA which is patent. There is a distal left M1/proximal left M2 occlusion near the base of the sylvian fissure (series 11, image 113). Attenuated flow distally within the left MCA branches. Right M1 segment widely patent without stenosis or occlusion. No proximal right M2 occlusion. Right MCA branches  widely patent distally. Posterior circulation: Vertebral arteries patent to the vertebrobasilar junction. Left vertebral artery slightly dominant. Posterior inferior cerebral arteries patent bilaterally. Basilar artery widely patent. Superior cerebral arteries patent bilaterally. Left PCA supplied via the basilar and is widely patent to its distal aspect. Hypoplastic right P1 segment with a mildly prominent right posterior communicating artery. Right PCA also supplied to its distal aspect. Venous sinuses: Patent. Anatomic variants: No significant anatomic variant. No aneurysm or vascular malformation. Delayed phase: Not performed. Review of the MIP images confirms the above findings IMPRESSION: 1. Positive CTA for emergent large vessel occlusion with distal left M1/ proximal left M2 occlusion as above. 2. Additional fairly mild atheromatous disease as above. No other significant flow-limiting stenosis or other abnormality identified. Critical Value/emergent results were called by telephone at the time of interpretation on 05/21/2016 at 6:34 pm to Dr. Melba Coon , who verbally acknowledged these results. Electronically Signed   By: Jeannine Boga M.D.   On: 05/21/2016 18:54   Ct Angio Neck W Or Wo Contrast  Result Date: 05/21/2016 CLINICAL DATA:  Acute facial droop, right-sided deficits. Prior CT from earlier the same day. EXAM: CT ANGIOGRAPHY HEAD AND NECK TECHNIQUE: Multidetector CT imaging of the head and neck was performed using the standard protocol during bolus administration of intravenous contrast. Multiplanar CT image reconstructions and MIPs were obtained to evaluate the vascular anatomy. Carotid stenosis measurements (when applicable) are obtained utilizing NASCET criteria, using the distal internal carotid diameter as the denominator. CONTRAST:  50 cc of Isovue 370.  Initial evaluation for COMPARISON:  None. FINDINGS: CTA NECK FINDINGS Aortic arch: Visualized aortic arch of normal caliber with  normal 3 vessel morphology. No high-grade stenosis seen about the origin of the great vessels. Visualized subclavian arteries widely patent. Right carotid system: Right common carotid artery patent from its origin to the bifurcation. Mild a centric calcified plaque about the right bifurcation without significant stenosis. Right ICA patent distally to the skullbase without significant stenosis, dissection, or vascular occlusion. Left carotid system: Left common carotid artery patent from its origin to the bifurcation. No significant atheromatous narrowing about the left bifurcation. Left ICA patent from the bifurcation to the skullbase without stenosis, dissection, or occlusion. Vertebral arteries: Both of the vertebral arteries arise from the subclavian arteries. Vertebral arteries widely patent within the neck without stenosis, dissection, or occlusion. Skeleton: Visualized osseous structures within normal limits. No acute osseous abnormality. No worrisome lytic or blastic osseous lesions. Other neck: Visualized soft tissues of the neck demonstrate no acute abnormality. Thyroid normal. Salivary glands normal. No adenopathy. Upper chest: Visualized mediastinum within normal limits. Visualized lungs are clear. Mild emphysema noted. Review of the MIP images confirms the above findings CTA HEAD FINDINGS Anterior circulation: Petrous, cavernous, and supraclinoid segments of the internal carotid arteries are widely patent bilaterally without flow-limiting stenosis. Focal plaque noted at the supraclinoid right ICA with  secondary mild stenosis. A1 segments patent. Right A1 segment mildly hypoplastic. Anterior communicating artery normal. Anterior cerebral arteries patent to their distal aspects. Proximal and mid left M1 segment widely patent. Proximal takeoff of the anterior division of the left MCA which is patent. There is a distal left M1/proximal left M2 occlusion near the base of the sylvian fissure (series 11, image  113). Attenuated flow distally within the left MCA branches. Right M1 segment widely patent without stenosis or occlusion. No proximal right M2 occlusion. Right MCA branches widely patent distally. Posterior circulation: Vertebral arteries patent to the vertebrobasilar junction. Left vertebral artery slightly dominant. Posterior inferior cerebral arteries patent bilaterally. Basilar artery widely patent. Superior cerebral arteries patent bilaterally. Left PCA supplied via the basilar and is widely patent to its distal aspect. Hypoplastic right P1 segment with a mildly prominent right posterior communicating artery. Right PCA also supplied to its distal aspect. Venous sinuses: Patent. Anatomic variants: No significant anatomic variant. No aneurysm or vascular malformation. Delayed phase: Not performed. Review of the MIP images confirms the above findings IMPRESSION: 1. Positive CTA for emergent large vessel occlusion with distal left M1/ proximal left M2 occlusion as above. 2. Additional fairly mild atheromatous disease as above. No other significant flow-limiting stenosis or other abnormality identified. Critical Value/emergent results were called by telephone at the time of interpretation on 05/21/2016 at 6:34 pm to Dr. Melba Coon , who verbally acknowledged these results. Electronically Signed   By: Jeannine Boga M.D.   On: 05/21/2016 18:54   Ct Head Code Stroke W/o Cm  Result Date: 05/21/2016 CLINICAL DATA:  Code stroke.  Right-sided deficits. EXAM: CT HEAD WITHOUT CONTRAST TECHNIQUE: Contiguous axial images were obtained from the base of the skull through the vertex without intravenous contrast. COMPARISON:  None. FINDINGS: Brain: No evidence of acute infarction, hemorrhage, hydrocephalus, extra-axial collection or mass lesion/mass effect. Right inferior cerebellar encephalomalacia probably representing sequelae of chronic infarction. Vascular: No hyperdense vessel or unexpected calcification.  Skull: Normal. Negative for fracture or focal lesion. Sinuses/Orbits: No acute finding. Other: None. ASPECTS [facility] Stroke Program Early CT Score) - Ganglionic level infarction (caudate, lentiform nuclei, internal capsule, insula, M1-M3 cortex): 7 - Supraganglionic infarction (M4-M6 cortex): 3 Total score (0-10 with 10 being normal): 10 IMPRESSION: 1. No acute intracranial abnormality identified. 2. Right inferior cerebellar encephalomalacia, probably representing sequelae of chronic infarction. 3. ASPECTS is 10 These results were called by telephone at the time of interpretation on 05/21/2016 at 6:19 pm to Dr. Tyrone Nine who verbally acknowledged these results. Electronically Signed   By: Kristine Garbe M.D.   On: 05/21/2016 18:24     STUDIES:  CTA head 02/21 > large vessel occlusion with distal left M1 / proximal left M2 occlusion. CT head 02/22 >   CULTURES: None.  ANTIBIOTICS: None.  SIGNIFICANT EVENTS: 02/21 > admit.  LINES/TUBES: ETT 02/21 >   DISCUSSION: 41 y.o. male with extensive PMH, admitted 02/21 with left M2 occlusion.  He received tPA then was taken to IR for revascularization.  Returned to the ICU on the vent and PCCM asked to see for vent management.  ASSESSMENT / PLAN:  NEUROLOGIC A:   L2 occlusion - s/p tPA and IR revascularization. Acute encephalopathy due to sedation. Polysubstance abuse - UDS in past positive for opiates and cocaine. P:   Neuro following / managing. Sedation:  Propofol gtt / Fentanyl PRN. RASS goal: 0 to -1. Daily WUA. Assess UDS.  PULMONARY A: VDRF - due to left M2 occlusion. Hx  OSA - unsure if on CPAP. Tobacco dependence. P:   Full vent support. Wean as able. VAP prevention measures. SBT in AM if able. CXR in AM. Tobacco cessation counseling once extubated.  CARDIOVASCULAR A:  Hx PVT, combined CHF with ICM (Echo from June 2017 with EF 20%, G2DD), CAD, HLD. P:  Monitor hemodynamics. Continue preadmission aspirin,  atorvastatin. Hold preadmission carvedilol, furosemide, imdur, nitro, entresto, spironolactone.  RENAL A:   AKI. P:   NS @ 75. BMP in AM.  GASTROINTESTINAL A:   GI prophylaxis. Nutrition. P:   SUP: Pantoprazole. NPO.  HEMATOLOGIC A:   VTE Prophylaxis. P:  SCD's / heparin. CBC in AM.  INFECTIOUS A:   No indication of infection. P:   No interventions required.  ENDOCRINE A:   No acute issues.  P:   No interventions required.  Family updated: None available.  Interdisciplinary Family Meeting v Palliative Care Meeting:  Due by: 05/27/16.  CC time: 35 minutes.   Montey Hora, [place] [facility] Pager: [phone]  or [phone] 05/21/2016, 9:11 PM  ATTENDING NOTE / ATTESTATION NOTE :   I have discussed the case with the resident/APP  Montey Hora PA   I agree with the resident/APP's  history, physical examination, assessment, and plans.    I have edited the above note and modified it according to our agreed history, physical examination, assessment and plan.   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  He was brought to [facility] ED 02/21 with sudden onset right sided weakness and dysphasia. In ED, he had CTA head showed proximal occlusion of the left M2 segment.  He received tPA and was then taken to IR for revascularization. He returned to the ICU on the vent and PCCM was consulted for vent management. No issues during procedure.  Vitals:  Vitals:   05/21/16 1900 05/21/16 1906 05/21/16 1916 05/21/16 2109  BP: 115/88 115/88    Pulse: 67 63 64   Resp: 19 16    Temp:  98.7 F (37.1 C)    SpO2: 100% 100%  100%  Weight:      Height:    6\' 2"  (1.88 m)    Constitutional/General: well-nourished, well-developed, intubated, sedated, not in any distress  Body mass index is 27.46 kg/m. Wt Readings from Last 3 Encounters:  05/21/16 97 kg (213 lb 13.5 oz)   04/15/16 103.9 kg (229 lb)  04/15/16 102.2 kg (225 lb 4.8 oz)    HEENT: PERLA, anicteric sclerae. (-) Oral thrush. Intubated, ETT in place  Neck: No masses. Midline trachea. No JVD, (-) LAD. (-) bruits appreciated.  Respiratory/Chest: Grossly normal chest. (-) deformity. (-) Accessory muscle use.  Symmetric expansion. (+) surgical scar from CABG Diminished BS on both lower lung zones. (-) wheezing, crackles, rhonchi (-) egophony  Cardiovascular: Regular rate and  rhythm, heart sounds normal, no murmur or gallops, (-) edema  Gastrointestinal:  Normal bowel sounds. Soft, non-tender. No hepatosplenomegaly.  (-) masses.   Musculoskeletal:  Normal muscle tone.   Extremities: Grossly normal. (-) clubbing, cyanosis.  (-) edema  Skin: (-) rash,lesions seen.   Neurological/Psychiatric : sedated, intubated. CN grossly intact. (-) lateralizing signs.     CBC Recent Labs     05/21/16  1804  05/21/16  1809  WBC  11.3*   --   HGB  15.1  16.0  HCT  46.2  47.0  PLT  186   --     Coag's Recent Labs     05/21/16  1804  APTT  30  INR  0.95    BMET Recent Labs     05/21/16  1804  05/21/16  1809  NA  140  141  K  4.7  4.4  CL  106  103  CO2  27   --   BUN  11  14  CREATININE  1.37*  1.40*  GLUCOSE  99  99    Electrolytes Recent Labs     05/21/16  1804  CALCIUM  9.5    Sepsis Markers No results for input(s): PROCALCITON, O2SATVEN in the last 72 hours.  Invalid input(s): LACTICACIDVEN  ABG No results for input(s): PHART, PCO2ART, PO2ART in the last 72 hours.  Liver Enzymes Recent Labs     05/21/16  1804  AST  21  ALT  19  ALKPHOS  57  BILITOT  0.5  ALBUMIN  3.9    Cardiac Enzymes No results for input(s): TROPONINI, PROBNP in the last 72 hours.  Glucose No results for input(s): GLUCAP in the last 72 hours.  Imaging Ct Angio Head W Or Wo Contrast  Result Date: 05/21/2016 CLINICAL DATA:  Acute facial droop, right-sided deficits. Prior CT from  earlier the same day. EXAM: CT ANGIOGRAPHY HEAD AND NECK TECHNIQUE: Multidetector CT imaging of the head and neck was performed using the standard protocol during bolus administration of intravenous contrast. Multiplanar CT image reconstructions and MIPs were obtained to evaluate the vascular anatomy. Carotid stenosis measurements (when applicable) are obtained utilizing NASCET criteria, using the distal internal carotid diameter as the denominator. CONTRAST:  50 cc of Isovue 370.  Initial evaluation for COMPARISON:  None. FINDINGS: CTA NECK FINDINGS Aortic arch: Visualized aortic arch of normal caliber with normal 3 vessel morphology. No high-grade stenosis seen about the origin of the great vessels. Visualized subclavian arteries widely patent. Right carotid system: Right common carotid artery patent from its origin to the bifurcation. Mild a centric calcified plaque about the right bifurcation without significant stenosis. Right ICA patent distally to the skullbase without significant stenosis, dissection, or vascular occlusion. Left carotid system: Left common carotid artery patent from its origin to the bifurcation. No significant atheromatous narrowing about the left bifurcation. Left ICA patent from the bifurcation to the skullbase without stenosis, dissection, or occlusion. Vertebral arteries: Both of the vertebral arteries arise from the subclavian arteries. Vertebral arteries widely patent within the neck without stenosis, dissection, or occlusion. Skeleton: Visualized osseous structures within normal limits. No acute osseous abnormality. No worrisome lytic or blastic osseous lesions. Other neck: Visualized soft tissues of the neck demonstrate no acute abnormality. Thyroid normal. Salivary glands normal. No adenopathy. Upper chest: Visualized mediastinum within normal limits. Visualized lungs are clear. Mild emphysema noted. Review of the MIP images confirms the above findings CTA HEAD FINDINGS Anterior  circulation: Petrous, cavernous, and supraclinoid segments of the internal carotid arteries are widely patent bilaterally without flow-limiting stenosis. Focal plaque noted at the supraclinoid right ICA with secondary mild stenosis. A1 segments patent. Right A1 segment mildly hypoplastic. Anterior communicating artery normal. Anterior cerebral arteries patent to their distal aspects. Proximal and mid left M1 segment widely patent. Proximal takeoff of the anterior division of the left MCA which is patent. There is a distal left M1/proximal left M2 occlusion near the base of the sylvian fissure (series 11, image 113). Attenuated flow distally within the  left MCA branches. Right M1 segment widely patent without stenosis or occlusion. No proximal right M2 occlusion. Right MCA branches widely patent distally. Posterior circulation: Vertebral arteries patent to the vertebrobasilar junction. Left vertebral artery slightly dominant. Posterior inferior cerebral arteries patent bilaterally. Basilar artery widely patent. Superior cerebral arteries patent bilaterally. Left PCA supplied via the basilar and is widely patent to its distal aspect. Hypoplastic right P1 segment with a mildly prominent right posterior communicating artery. Right PCA also supplied to its distal aspect. Venous sinuses: Patent. Anatomic variants: No significant anatomic variant. No aneurysm or vascular malformation. Delayed phase: Not performed. Review of the MIP images confirms the above findings IMPRESSION: 1. Positive CTA for emergent large vessel occlusion with distal left M1/ proximal left M2 occlusion as above. 2. Additional fairly mild atheromatous disease as above. No other significant flow-limiting stenosis or other abnormality identified. Critical Value/emergent results were called by telephone at the time of interpretation on 05/21/2016 at 6:34 pm to Dr. Melba Coon , who verbally acknowledged these results. Electronically Signed   By:  Jeannine Boga M.D.   On: 05/21/2016 18:54   Ct Angio Neck W Or Wo Contrast  Result Date: 05/21/2016 CLINICAL DATA:  Acute facial droop, right-sided deficits. Prior CT from earlier the same day. EXAM: CT ANGIOGRAPHY HEAD AND NECK TECHNIQUE: Multidetector CT imaging of the head and neck was performed using the standard protocol during bolus administration of intravenous contrast. Multiplanar CT image reconstructions and MIPs were obtained to evaluate the vascular anatomy. Carotid stenosis measurements (when applicable) are obtained utilizing NASCET criteria, using the distal internal carotid diameter as the denominator. CONTRAST:  50 cc of Isovue 370.  Initial evaluation for COMPARISON:  None. FINDINGS: CTA NECK FINDINGS Aortic arch: Visualized aortic arch of normal caliber with normal 3 vessel morphology. No high-grade stenosis seen about the origin of the great vessels. Visualized subclavian arteries widely patent. Right carotid system: Right common carotid artery patent from its origin to the bifurcation. Mild a centric calcified plaque about the right bifurcation without significant stenosis. Right ICA patent distally to the skullbase without significant stenosis, dissection, or vascular occlusion. Left carotid system: Left common carotid artery patent from its origin to the bifurcation. No significant atheromatous narrowing about the left bifurcation. Left ICA patent from the bifurcation to the skullbase without stenosis, dissection, or occlusion. Vertebral arteries: Both of the vertebral arteries arise from the subclavian arteries. Vertebral arteries widely patent within the neck without stenosis, dissection, or occlusion. Skeleton: Visualized osseous structures within normal limits. No acute osseous abnormality. No worrisome lytic or blastic osseous lesions. Other neck: Visualized soft tissues of the neck demonstrate no acute abnormality. Thyroid normal. Salivary glands normal. No adenopathy. Upper  chest: Visualized mediastinum within normal limits. Visualized lungs are clear. Mild emphysema noted. Review of the MIP images confirms the above findings CTA HEAD FINDINGS Anterior circulation: Petrous, cavernous, and supraclinoid segments of the internal carotid arteries are widely patent bilaterally without flow-limiting stenosis. Focal plaque noted at the supraclinoid right ICA with secondary mild stenosis. A1 segments patent. Right A1 segment mildly hypoplastic. Anterior communicating artery normal. Anterior cerebral arteries patent to their distal aspects. Proximal and mid left M1 segment widely patent. Proximal takeoff of the anterior division of the left MCA which is patent. There is a distal left M1/proximal left M2 occlusion near the base of the sylvian fissure (series 11, image 113). Attenuated flow distally within the left MCA branches. Right M1 segment widely patent without stenosis or occlusion. No proximal  right M2 occlusion. Right MCA branches widely patent distally. Posterior circulation: Vertebral arteries patent to the vertebrobasilar junction. Left vertebral artery slightly dominant. Posterior inferior cerebral arteries patent bilaterally. Basilar artery widely patent. Superior cerebral arteries patent bilaterally. Left PCA supplied via the basilar and is widely patent to its distal aspect. Hypoplastic right P1 segment with a mildly prominent right posterior communicating artery. Right PCA also supplied to its distal aspect. Venous sinuses: Patent. Anatomic variants: No significant anatomic variant. No aneurysm or vascular malformation. Delayed phase: Not performed. Review of the MIP images confirms the above findings IMPRESSION: 1. Positive CTA for emergent large vessel occlusion with distal left M1/ proximal left M2 occlusion as above. 2. Additional fairly mild atheromatous disease as above. No other significant flow-limiting stenosis or other abnormality identified. Critical Value/emergent  results were called by telephone at the time of interpretation on 05/21/2016 at 6:34 pm to Dr. Melba Coon , who verbally acknowledged these results. Electronically Signed   By: Jeannine Boga M.D.   On: 05/21/2016 18:54   Portable Chest Xray  Result Date: 05/21/2016 CLINICAL DATA:  Respiratory failure EXAM: PORTABLE CHEST 1 VIEW COMPARISON:  09/05/2015 FINDINGS: Post sternotomy changes. There is a left-sided duo lead pacing device with leads projecting over the right atrium and right ventricle. Endotracheal tube tip partially obscured by overlying sternotomy, it is suspected to be approximately 3.8 cm superior to the carina. Esophageal tube tip is below the diaphragm but is not included. The right lung is grossly clear. There is dense left lower lobe consolidation and probable small effusion. There is mild cardiomegaly. IMPRESSION: 1. Dense left lower lobe consolidation may reflect atelectasis or a pneumonia. Suspect that there is a small left effusion 2. Mild cardiomegaly. Electronically Signed   By: Donavan Foil M.D.   On: 05/21/2016 21:56   Dg Abd Portable 1v  Result Date: 05/21/2016 CLINICAL DATA:  Orogastric tube placement EXAM: PORTABLE ABDOMEN - 1 VIEW COMPARISON:  None. FINDINGS: The tip and side port of the or gastric tube project over the gastric body. Nonobstructive bowel gas pattern. No osseous abnormalities. IMPRESSION: Orogastric tube tip and side port overlie the gastric body. Electronically Signed   By: Ulyses Jarred M.D.   On: 05/21/2016 22:06   Ct Head Code Stroke W/o Cm  Result Date: 05/21/2016 CLINICAL DATA:  Code stroke.  Right-sided deficits. EXAM: CT HEAD WITHOUT CONTRAST TECHNIQUE: Contiguous axial images were obtained from the base of the skull through the vertex without intravenous contrast. COMPARISON:  None. FINDINGS: Brain: No evidence of acute infarction, hemorrhage, hydrocephalus, extra-axial collection or mass lesion/mass effect. Right inferior cerebellar  encephalomalacia probably representing sequelae of chronic infarction. Vascular: No hyperdense vessel or unexpected calcification. Skull: Normal. Negative for fracture or focal lesion. Sinuses/Orbits: No acute finding. Other: None. ASPECTS [facility] Stroke Program Early CT Score) - Ganglionic level infarction (caudate, lentiform nuclei, internal capsule, insula, M1-M3 cortex): 7 - Supraganglionic infarction (M4-M6 cortex): 3 Total score (0-10 with 10 being normal): 10 IMPRESSION: 1. No acute intracranial abnormality identified. 2. Right inferior cerebellar encephalomalacia, probably representing sequelae of chronic infarction. 3. ASPECTS is 10 These results were called by telephone at the time of interpretation on 05/21/2016 at 6:19 pm to Dr. Tyrone Nine who verbally acknowledged these results. Electronically Signed   By: Kristine Garbe M.D.   On: 05/21/2016 18:24    Assessment/Plan: S/P Lt common carotid arteriogram,followed by complete revascularization  of occluded Lt MCA M1 segment  - Per neurology - Keep intubated overnight  Acute hypoxemic respiratory failure secondary to unable to protect airway with the procedure. Patient noted to be an active smoker. Patient also has chronic systolic  heart failure. - Continue ventilatory support for now. Pressure support trial in the morning. - versed and fentanyl IV pushes prn - Check ABG - CXR with possible infiltrate vs atelectasis at the left base. Observe for now off antibiotics. Check tracheal aspirate. - Check PCT - Start antibiotics with Rocephin and azithromycin if with fever spike. - Suggest to decrease IVF to 50 mls/L. WOF congestion.    CHF, last known EF 20% - resume home meds   AKI/CKD - On IVF >> suggest to decrease to 50 mls/hr for fear of congestion. - CXR in am. May need diuresis   Best practice - SCD for DVT prophylaxis - protonix for SUP   I spent   30  minutes of Critical Care time with this patient today. This is my  time spent independent of the APP or resident.   Family :  No family at bedside.    Monica Becton, MD 05/21/2016, 10:12 PM [facility] Pager [phone] After 3 pm or if no answer, call [phone]

## 2016-05-21 NOTE — Transfer of Care (Signed)
Immediate Anesthesia Transfer of Care Note  Patient: John Davenport  Procedure(s) Performed: Procedure(s): RADIOLOGY WITH ANESTHESIA (N/A)  Patient Location: SICU  Anesthesia Type:General  Level of Consciousness: Patient remains intubated per anesthesia plan  Airway & Oxygen Therapy: Patient remains intubated per anesthesia plan and Patient placed on Ventilator (see vital sign flow sheet for setting)  Post-op Assessment: Report given to RN and Post -op Vital signs reviewed and stable  Post vital signs: Reviewed and stable  Last Vitals:  Vitals:   05/21/16 1906 05/21/16 1916  BP: 115/88   Pulse: 63 64  Resp: 16   Temp: 37.1 C     Last Pain:  Vitals:   05/21/16 1845  PainSc: 7          Complications: No apparent anesthesia complications

## 2016-05-21 NOTE — Anesthesia Procedure Notes (Signed)
Procedure Name: Intubation Date/Time: 05/21/2016 7:28 PM Performed by: Mosie Epstein Pre-anesthesia Checklist: Patient identified, Emergency Drugs available, Suction available, Patient being monitored and Timeout performed Patient Re-evaluated:Patient Re-evaluated prior to inductionOxygen Delivery Method: Circle system utilized Preoxygenation: Pre-oxygenation with 100% oxygen Intubation Type: IV induction, Rapid sequence and Cricoid Pressure applied Laryngoscope Size: Mac and 4 Grade View: Grade I Tube type: Subglottic suction tube Tube size: 7.5 mm Placement Confirmation: ETT inserted through vocal cords under direct vision,  positive ETCO2 and breath sounds checked- equal and bilateral Secured at: 22 cm Tube secured with: Tape Dental Injury: Teeth and Oropharynx as per pre-operative assessment

## 2016-05-21 NOTE — ED Triage Notes (Addendum)
Pt here from home via [facility] for code stroke.  Pt was with family and they noted acute onset altered mental status at 6 with R sided weakness.  When [facility] arrived pt could not lift R arm and R leg and showed R facial droop.  Upon arrival to ED R facial droop improved as well as improved R arm and leg movement.  [facility] vs:  cbg 102, hr 98, rr 18, bp 118/86, 98% RA.

## 2016-05-21 NOTE — ED Notes (Signed)
TPA started with pharmacy

## 2016-05-21 NOTE — H&P (Signed)
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 Today he was in his usual state of health until about 1700 when he developed the abrupt onset of right-sided weakness and difficulty speaking. His friend called 911 and EMS arrived to find the patient with expressive aphasia and dense weakness of the R arm and face with lesser involvement of the R leg. Symptoms improved during transport to the ED and he was able to move his R side and speak by the time they arrived at hospital. I met the patient on his arrival to the ED with the rest of the stroke team. He had a mild R facial droop, some occasional word-finding difficulties, and mildly slurred speech for an NIHSS score of 3. Of note, the patient sat up on the gurney while the RN was putting him into a gown and he was observed to have clear worsening of both his aphasia and his facial droop. CTH showed no acute abnormality. CTA of the head showed proximal occlusion of the L M2 segment. The case was reviewed with Dr. Estanislado Pandy, neurointerventional radiology, and it was felt that the patient would likely benefit from thrombectomy. After speaking with the patient and his mother (who is his POA), the decision was made to proceed with IV tPA followed by thrombectomy in IR. IV tPA was started at 1850, with 8 mg bolus followed by 79 mg infusion.   Last known well: 1700 NHISS score: 3 tPA given?: yes   PMH:  Past Medical History:  Diagnosis Date  . Anxiety state, unspecified   . Back pain   . Cancer ([place])    Lung CA  . Chronic systolic heart failure ([place])    a. Ischemic CM (EF 30% by echo in 2011) b. echo 08/2015: EF 20% with severe HK and AK of the inferoseptal and apical myocardium.  . Coronary artery disease    a. s/p AMI 10/09 s/p BMS  to LAD 12/2007 with subsequent POBA to LAD stent in 08/2008.  b. eventually required single vessel CABG (L-LAD) in 03/2009 due to restenosis. c. cath 2013 showed patent LIMA sequential to the diags with LAD filling retrograde, no new disease in RCA and Cx, low LVEDP, sx felt noncardiac. d. 08/2015: NSTEMI: CTO of prox LAD, 99% stenosis of small dCx (no stent), + for cocaine  . Esophageal reflux   . Heart attack   . Hypercholesteremia   . 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 [place] [employer] ICD 12/2008.   . Ischemic cardiomyopathy    echocardiogram 12/11: Mild LVH, EF 30-35%, anteroseptal and apical akinesis, grade 1 diastolic dysfunction  . LBP (low back pain)   . OSA (obstructive sleep apnea) 12/30/2010  . Paroxysmal ventricular tachycardia ([place])   . Tobacco abuse     PSH:  Past Surgical History:  Procedure Laterality Date  . CARDIAC CATHETERIZATION N/A 09/06/2015   Procedure: Left Heart Cath and Cors/Grafts Angiography;  Surgeon: Sherren Mocha, MD;  Location: [facility];  Service: Cardiovascular;  Laterality: N/A;  . CARDIAC DEFIBRILLATOR PLACEMENT    . CARDIAC DEFIBRILLATOR PLACEMENT     2010 by JA  . CORONARY ANGIOPLASTY WITH STENT PLACEMENT     LAD stenting 10/09 and re-opening 6/10  . CORONARY ARTERY BYPASS GRAFT  1/11  . LEFT HEART CATHETERIZATION WITH CORONARY ANGIOGRAM N/A 08/07/2011   Procedure: LEFT HEART CATHETERIZATION WITH CORONARY ANGIOGRAM;  Surgeon: Josue Hector, MD;  Location: [facility] CATH LAB;  Service: Cardiovascular;  Laterality: N/A;    Family history: Family History  Problem Relation Age of Onset  . Heart attack Father 74    died at 50 after multiple MI's  . COPD Maternal Grandmother     Social history:  Social History   Social History  . Marital status: Single    Spouse name: N/A  . Number of children: 1  . Years of education: N/A   Occupational History  . disabled Unemployed   Social History Main Topics  . Smoking  status: Current Every Day Smoker    Packs/day: 0.30    Years: 20.00    Types: Cigarettes  . Smokeless tobacco: Former Systems developer    Types: Chew  . Alcohol use No  . Drug use: No     Comment: REMOTE H/O SUBSTANCE ABUSE  . Sexual activity: Not on file   Other Topics Concern  . Not on file   Social History Narrative   Pt lives with his mom, brother, and 71 yo son in [place].   Quit tobacco in Jan. 2011 prior to heart surgery (prior -1/2 PPD (down from 1 ppd per day, onset age 1)    alcohol abuse- prior, hasn't drank since age 50   no illicit drug use    Currently unemployed, awaiting disability   Single          Current outpatient meds: No outpatient prescriptions have been marked as taking for the 05/21/16 encounter [facility] Encounter).    Current inpatient meds:  Current Facility-Administered Medications  Medication Dose Route Frequency Provider Last Rate Last Dose  .  stroke: mapping our early stages of recovery book   Does not apply Once Darrel Reach, MD      . 0.9 %  sodium chloride infusion   Intravenous Continuous Darrel Reach, MD      . acetaminophen (TYLENOL) tablet 650 mg  650 mg Oral Q4H PRN Darrel Reach, MD       Or  . acetaminophen (TYLENOL) solution 650 mg  650 mg Per Tube Q4H PRN Darrel Reach, MD       Or  . acetaminophen (TYLENOL) suppository 650 mg  650 mg Rectal Q4H PRN Darrel Reach, MD      . alteplase (ACTIVASE) 1 mg/mL infusion 87 mg  0.9 mg/kg Intravenous Once Darrel Reach, MD 87 mL/hr at 05/21/16 1850 87 mg at 05/21/16 1850  . labetalol (NORMODYNE,TRANDATE) injection 20 mg  20 mg Intravenous Once Darrel Reach, MD       And  . nicardipine (CARDENE) 53m in 0.86% saline 2038mIV infusion (0.1 mg/ml)  0-15 mg/hr Intravenous Continuous TiDarrel ReachMD      . pantoprazole (PROTONIX) injection 40 mg  40 mg Intravenous QHS TiDarrel ReachMD      . senna-docusate (Senokot-S) tablet 1 tablet  1  tablet Oral QHS PRN TiDarrel ReachMD       Current Outpatient Prescriptions  Medication Sig Dispense Refill  . aspirin EC 81 MG EC tablet Take 1 tablet (81 mg total) by mouth daily.    . Marland Kitchentorvastatin (LIPITOR) 40 MG tablet Take 1 tablet (40 mg total) by mouth daily. 30 tablet 2  . carvedilol (COREG) 6.25 MG tablet Take 1.5 tablets (9.375  mg total) by mouth 2 (two) times daily. 90 tablet 2  . Esomeprazole Magnesium (NEXIUM 24HR) 20 MG TBEC Take 20 mg by mouth daily.    . furosemide (LASIX) 40 MG tablet Take 20 mg by mouth.    Marland Kitchen guaiFENesin (MUCINEX) 600 MG 12 hr tablet Take 1 tablet (600 mg total) by mouth 2 (two) times daily. 14 tablet 0  . isosorbide mononitrate (IMDUR) 60 MG 24 hr tablet Take 1 tablet (60 mg total) by mouth daily. 30 tablet 2  . KLOR-CON M20 20 MEQ tablet Take 20 mEq by mouth daily.  2  . nitroGLYCERIN (NITROSTAT) 0.4 MG SL tablet Place 1 tablet (0.4 mg total) under the tongue every 5 (five) minutes as needed for chest pain. 100 tablet 0  . sacubitril-valsartan (ENTRESTO) 49-51 MG Take 1 tablet by mouth 2 (two) times daily. 60 tablet 3  . spironolactone (ALDACTONE) 25 MG tablet Take 0.5 tablets (12.5 mg total) by mouth daily. 15 tablet 2  . varenicline (CHANTIX CONTINUING MONTH PAK) 1 MG tablet Take 1 tablet (1 mg total) by mouth 2 (two) times daily. 60 tablet 11    Allergies: No Known Allergies  ROS: As per HPI. A full 14-point review of systems was performed and is otherwise unremarkable.   PE:  BP 107/92   Pulse 66   Resp 12   Wt 97 kg (213 lb 13.5 oz)   SpO2 99%   BMI 26.73 kg/m   General: WDWN, no acute distress. AAO x4. Speech notable for mild dysarthria. He has occasional pauses to search for words but no significant aphasia--this worsened when he sat upright, however. Follows commands briskly. Affect is mildly anxious. Comportment is normal.  HEENT: Normocephalic. Neck supple without LAD. MMM, OP clear. Dentition fair. Sclerae anicteric. Mild  conjunctival injection.  CV: Regular, no murmur. Carotid pulses full and symmetric, no bruits. Distal pulses 2+ and symmetric. ICD in place left chest. Lungs: CTAB.  Abdomen: Soft, non-distended, non-tender. Bowel sounds present x4.  Extremities: No C/C/E. Neuro:  CN: Pupils are equal and round. They are symmetrically reactive from 3-->2 mm. EOMI without nystagmus. No reported diplopia. Facial sensation is intact to light touch. He has a mild R central VII pattern of weakness. Hearing is intact to conversational voice. Palate elevates symmetrically and uvula is midline. Voice is normal in tone, pitch and quality. Bilateral SCM and trapezii are 5/5. Tongue is midline with normal bulk and mobility.  Motor: Normal bulk, tone, and strength. No tremor or other abnormal movements. No drift.  Sensation: Intact to light touch.  DTRs: 2+, symmetric. Toes downgoing on the L, upgoing on the R.  Coordination: Finger-to-nose and heel-to-shin are mildly clumsy but without overt dysmetria. Finger taps are slower on the R.    Labs:  Lab Results  Component Value Date   WBC 11.3 (H) 05/21/2016   HGB 16.0 05/21/2016   HCT 47.0 05/21/2016   PLT 186 05/21/2016   GLUCOSE 99 05/21/2016   CHOL 147 04/01/2016   TRIG 97 04/01/2016   HDL 31 (L) 04/01/2016   LDLCALC 97 04/01/2016   ALT 19 05/21/2016   AST 21 05/21/2016   NA 141 05/21/2016   K 4.4 05/21/2016   CL 103 05/21/2016   CREATININE 1.40 (H) 05/21/2016   BUN 14 05/21/2016   CO2 27 05/21/2016   TSH 1.973 09/05/2015   INR 0.95 05/21/2016   HGBA1C  04/09/2009    5.4 (NOTE) The ADA recommends the following therapeutic goal for  glycemic control related to Hgb A1c measurement: Goal of therapy: <6.5 Hgb A1c  Reference: American Diabetes Association: Clinical Practice Recommendations 2010, Diabetes Care, 2010, 33: (Suppl  1).   UA negative Urine drug screen pending  Imaging:  I have personally and independently reviewed the [place] without contrast from  today. This shows no obvious acute abnormality with ASPECTS 10. A focal area of hypodensity is noted in the R cerebellar hemisphere consistent with chronic encephalomalacia.   I have personally and independently reviewed the CTA of the head and neck from today. There appears to be an occlusion of the proximal L M2 segment with paucity of flow in this territory. Scattered atheromatous changes are noted throughout.    Assessment and Plan:  1. Acute Ischemic Stroke: This is an acute stroke involving the L MCA territory. It is most likely cardioembolic in etiology. Known risk factors for cerebrovascular disease in this patient include history of severe CAD, severe ischemic cardiomyopathy with EF 20%, tobacco abuse, family history of early cardiovascular disease. He has a documented history of cocaine abuse--urine drug screen is pending now. His symptoms improved significantly prior to arrival in hospital but he clearly has a flow-limiting lesion as symptoms worsened when he sat upright and improved again when placed supine. He was given IV tPA and taken for thrombectomy. Appreciate IR assistance.  Admit to neuro ICU for close monitoring for first 24 hours post-tPA with frequent neuro checks per protocol  Avoid antiplatelet agents and anticoagulation for the first 24 hours post-tPA   Monitor blood pressure closely, keeping SBP<180 and DBP<105 with short-acting easily titratable antihypertensives (i.e. Labetalol PRN, nicardipine or cleviprex infusion)  Avoid invasive procedures or punctures at non-compressible sites for the first 24 hours post-tPA  NPO until swallow evaluation passed  Initiate/continue statin for secondary stroke prevention  Given poor EF, may benefit from longterm anticoagulation for secondary stroke prevention once 24 hours out from tPA; will defer to stroke team  Avoid fever and hyperglycemia as these may result in extension of infarct and are associated with worse neurologic  prognosis  MRI brain without contrast if ICD is MR-compatible  TTE  Check fasting lipids and hemoglobin a1c  PT/OT/SLP to evaluate and treat as needed  Further recs as per IR  SCDs for DVT prophylaxis  Nicotine patch--tobacco cessation  Gentle IVFs, watch volume status closely given poor EF   2. Expressive aphasia: He has mild residual aphasia on exam. This is acute, due to stroke. SLP to evaluate and treat.   3. Right hemiparesis: This is acute, due to stroke. This improved significantly but he still has some R facial droop and mild clumsiness on the R side. PT/OT/rehab.   4. Dysarthria: He has mild dysarthria. This is acute, due to stroke. SLP to evaluate and treat. NPO until swallow evaluation passed.   This was discussed with the patient. I also spoke with his mother (POA) via telephone. She is in [place] and is driving here tonight to be with her son. Both have provided consent for tPA and are agreeable to thrombectomy. They are in agreement with the plan as noted. They were given the chance to ask any questions and these were addressed to their satisfaction. His aunt arrived in the ED and was updated as well.   The case was reviewed with Dr. Estanislado Pandy, IR, and he will mobilize the IR team. My impression and plan were also discussed with ED MD, Dr. Tyrone Nine.    The stroke team will assume care  of the patient beginning 05/22/16.   This patient is critically ill and at significant risk of neurological worsening, death and care requires constant monitoring of vital signs, hemodynamics,respiratory and cardiac monitoring, neurological assessment, discussion with family, other specialists and medical decision making of high complexity. A total of 80 minutes of critical care time was spent on this case.

## 2016-05-21 NOTE — Progress Notes (Signed)
Patient transported on vent from CT to [id number] without complication.

## 2016-05-22 ENCOUNTER — Inpatient Hospital Stay (HOSPITAL_COMMUNITY): Payer: Medicare Other

## 2016-05-22 ENCOUNTER — Encounter (HOSPITAL_COMMUNITY): Payer: Self-pay | Admitting: Interventional Radiology

## 2016-05-22 ENCOUNTER — Encounter (HOSPITAL_COMMUNITY): Payer: Self-pay | Admitting: Anesthesiology

## 2016-05-22 DIAGNOSIS — I6789 Other cerebrovascular disease: Secondary | ICD-10-CM

## 2016-05-22 LAB — CBC WITH DIFFERENTIAL/PLATELET
BASOS ABS: 0 10*3/uL (ref 0.0–0.1)
BASOS PCT: 1 %
Eosinophils Absolute: 0.5 10*3/uL (ref 0.0–0.7)
Eosinophils Relative: 5 %
HEMATOCRIT: 41.4 % (ref 39.0–52.0)
HEMOGLOBIN: 13.2 g/dL (ref 13.0–17.0)
LYMPHS PCT: 37 %
Lymphs Abs: 3.1 10*3/uL (ref 0.7–4.0)
MCH: 28.6 pg (ref 26.0–34.0)
MCHC: 31.9 g/dL (ref 30.0–36.0)
MCV: 89.8 fL (ref 78.0–100.0)
MONO ABS: 0.6 10*3/uL (ref 0.1–1.0)
Monocytes Relative: 7 %
NEUTROS PCT: 50 %
Neutro Abs: 4.1 10*3/uL (ref 1.7–7.7)
Platelets: 161 10*3/uL (ref 150–400)
RBC: 4.61 MIL/uL (ref 4.22–5.81)
RDW: 13.9 % (ref 11.5–15.5)
WBC: 8.3 10*3/uL (ref 4.0–10.5)

## 2016-05-22 LAB — LIPID PANEL
CHOL/HDL RATIO: 4.8 ratio
CHOLESTEROL: 130 mg/dL (ref 0–200)
HDL: 27 mg/dL — ABNORMAL LOW (ref >40)
LDL Cholesterol: 78 mg/dL (ref 0–99)
Triglycerides: 124 mg/dL (ref <150)
VLDL: 25 mg/dL (ref 0–40)

## 2016-05-22 LAB — ECHOCARDIOGRAM COMPLETE
HEIGHTINCHES: 74 in
WEIGHTICAEL: 3421.54 [oz_av]

## 2016-05-22 LAB — BASIC METABOLIC PANEL
ANION GAP: 9 (ref 5–15)
BUN: 9 mg/dL (ref 6–20)
CO2: 25 mmol/L (ref 22–32)
Calcium: 9.1 mg/dL (ref 8.9–10.3)
Chloride: 106 mmol/L (ref 101–111)
Creatinine, Ser: 1.01 mg/dL (ref 0.61–1.24)
GFR calc non Af Amer: >60 mL/min (ref >60)
GLUCOSE: 92 mg/dL (ref 65–99)
POTASSIUM: 3.6 mmol/L (ref 3.5–5.1)
Sodium: 140 mmol/L (ref 135–145)

## 2016-05-22 LAB — PROCALCITONIN

## 2016-05-22 LAB — HIV ANTIBODY (ROUTINE TESTING W REFLEX): HIV SCREEN 4TH GENERATION: NONREACTIVE

## 2016-05-22 LAB — MRSA PCR SCREENING: MRSA by PCR: NEGATIVE

## 2016-05-22 MED ORDER — DEXMEDETOMIDINE HCL IN NACL 400 MCG/100ML IV SOLN
0.2000 ug/kg/h | INTRAVENOUS | Status: DC
  Administered 2016-05-22: 0.7 ug/kg/h via INTRAVENOUS
  Administered 2016-05-22: 0.2 ug/kg/h via INTRAVENOUS
  Administered 2016-05-22 – 2016-05-23 (×3): 0.7 ug/kg/h via INTRAVENOUS
  Filled 2016-05-22 (×2): qty 50
  Filled 2016-05-22: qty 100
  Filled 2016-05-22 (×2): qty 50

## 2016-05-22 MED ORDER — SODIUM CHLORIDE 0.9 % IV BOLUS (SEPSIS)
500.0000 mL | Freq: Once | INTRAVENOUS | Status: AC
  Administered 2016-05-22: 500 mL via INTRAVENOUS

## 2016-05-22 MED ORDER — LORAZEPAM 2 MG/ML IJ SOLN
1.0000 mg | INTRAMUSCULAR | Status: DC | PRN
  Filled 2016-05-22: qty 1

## 2016-05-22 MED ORDER — FENTANYL CITRATE (PF) 100 MCG/2ML IJ SOLN
25.0000 ug | INTRAMUSCULAR | Status: DC | PRN
  Administered 2016-05-22 – 2016-05-23 (×2): 25 ug via INTRAVENOUS
  Filled 2016-05-22 (×2): qty 2

## 2016-05-22 MED ORDER — SODIUM CHLORIDE 0.9 % IV SOLN
0.0000 ug/min | INTRAVENOUS | Status: DC
  Administered 2016-05-22: 60 ug/min via INTRAVENOUS
  Administered 2016-05-22: 35 ug/min via INTRAVENOUS
  Administered 2016-05-22: 5 ug/min via INTRAVENOUS
  Administered 2016-05-23: 45 ug/min via INTRAVENOUS
  Administered 2016-05-23: 15 ug/min via INTRAVENOUS
  Filled 2016-05-22 (×6): qty 1

## 2016-05-22 NOTE — Progress Notes (Signed)
Assisted Rod Holler RN to IR where pt was prepped for IR procedure. Report handed off to IR team

## 2016-05-22 NOTE — Care Management Note (Signed)
Case Management Note  Patient Details  Name: John Davenport MRN: [id number] Date of Birth: 06-10-1975  Subjective/Objective:    Pt admitted on 05/21/16 s/p proximal occlusion of the LT M2 segment s/p TPA and revascularization.  PTA, pt independent, lives with mother and brother and 43yo son.  Pt currently unemployed and on disability.                     Action/Plan: Will follow for discharge planning as pt progresses.  PT/OT evaluations pending.    Expected Discharge Date:                  Expected Discharge Plan:  [place]  In-House Referral:     Discharge planning Services  CM Consult  Post Acute Care Choice:    Choice offered to:     DME Arranged:    DME Agency:     HH Arranged:    [place] Agency:     Status of Service:  In process, will continue to follow  If discussed at Long Length of Stay Meetings, dates discussed:    Additional Comments:  Reinaldo Raddle, RN, BSN  Trauma/Neuro ICU Case Manager [phone]

## 2016-05-22 NOTE — Progress Notes (Signed)
SLP Cancellation Note  Patient Details Name: John Davenport MRN: [id number] DOB: Aug 26, 1975   Cancelled treatment:       Reason Eval/Treat Not Completed: Patient not medically ready, remains on vent this morning. Will f/u for readiness.   [name] 05/22/2016, 9:59 AM  [name], M.A. CCC-SLP [phone]

## 2016-05-22 NOTE — Progress Notes (Signed)
Referring Physician(s): Dr Florene Route  Supervising Physician: Luanne Bras  Patient Status:  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  Subjective:  Agitated---RN having to increase sedation meds Moving all 4s spontaneously Some to command On vent  Allergies: Patient has no known allergies.  Medications: Prior to Admission medications   Medication Sig Start Date End Date Taking? Authorizing Provider  aspirin EC 81 MG EC tablet Take 1 tablet (81 mg total) by mouth daily. 09/07/15  Yes Erma Heritage, PA  atorvastatin (LIPITOR) 40 MG tablet Take 1 tablet (40 mg total) by mouth daily. 03/11/16   Larey Dresser, MD  carvedilol (COREG) 6.25 MG tablet Take 1.5 tablets (9.375 mg total) by mouth 2 (two) times daily. 04/01/16   Amy D Clegg, NP  Esomeprazole Magnesium (NEXIUM 24HR) 20 MG TBEC Take 20 mg by mouth daily.    Historical Provider, MD  furosemide (LASIX) 40 MG tablet Take 20 mg by mouth.    Historical Provider, MD  guaiFENesin (MUCINEX) 600 MG 12 hr tablet Take 1 tablet (600 mg total) by mouth 2 (two) times daily. 04/15/16   Maryellen Pile, MD  isosorbide mononitrate (IMDUR) 60 MG 24 hr tablet Take 1 tablet (60 mg total) by mouth daily. 03/11/16 03/11/17  Larey Dresser, MD  KLOR-CON M20 20 MEQ tablet Take 20 mEq by mouth daily. 04/15/16   Historical Provider, MD  nitroGLYCERIN (NITROSTAT) 0.4 MG SL tablet Place 1 tablet (0.4 mg total) under the tongue every 5 (five) minutes as needed for chest pain. 09/24/15   Brittainy M Simmons, PA-C  sacubitril-valsartan (ENTRESTO) 49-51 MG Take 1 tablet by mouth 2 (two) times daily. 04/15/16   Amy D Ninfa Meeker, NP  spironolactone (ALDACTONE) 25 MG tablet Take 0.5 tablets (12.5 mg total) by mouth daily. 03/11/16 06/09/16  Larey Dresser, MD    varenicline (CHANTIX CONTINUING MONTH PAK) 1 MG tablet Take 1 tablet (1 mg total) by mouth 2 (two) times daily. 03/17/16   Larey Dresser, MD     Vital Signs: BP 104/76   Pulse 60   Temp 97.4 F (36.3 C) (Oral)   Resp 16   Ht 6\' 2"  (1.88 m)   Wt 213 lb 13.5 oz (97 kg)   SpO2 100%   BMI 27.46 kg/m   Physical Exam  Cardiovascular: Normal rate.   Pulmonary/Chest:  vent  Abdominal: Soft.  Musculoskeletal:  Moves all 4s as Ii enter room Agitated and RN increasing sedation meds Can follow few commands Opens eyes when asked Moves feet to command   Skin: Skin is warm and dry.  Sheaths intact No bleeding No hematomas Feet 1+ pulses  Nursing note and vitals reviewed.   Imaging: Ct Angio Head W Or Wo Contrast  Result Date: 05/21/2016 CLINICAL DATA:  Acute facial droop, right-sided deficits. Prior CT from earlier the same day. EXAM: CT ANGIOGRAPHY HEAD AND NECK TECHNIQUE: Multidetector CT imaging of the head and neck was performed using the standard protocol during bolus administration of intravenous contrast. Multiplanar CT image reconstructions and MIPs were obtained to evaluate the vascular anatomy. Carotid stenosis measurements (when applicable) are obtained utilizing NASCET criteria, using the distal internal carotid diameter as the denominator. CONTRAST:  50 cc of Isovue 370.  Initial evaluation for COMPARISON:  None.  FINDINGS: CTA NECK FINDINGS Aortic arch: Visualized aortic arch of normal caliber with normal 3 vessel morphology. No high-grade stenosis seen about the origin of the great vessels. Visualized subclavian arteries widely patent. Right carotid system: Right common carotid artery patent from its origin to the bifurcation. Mild a centric calcified plaque about the right bifurcation without significant stenosis. Right ICA patent distally to the skullbase without significant stenosis, dissection, or vascular occlusion. Left carotid system: Left common carotid artery patent  from its origin to the bifurcation. No significant atheromatous narrowing about the left bifurcation. Left ICA patent from the bifurcation to the skullbase without stenosis, dissection, or occlusion. Vertebral arteries: Both of the vertebral arteries arise from the subclavian arteries. Vertebral arteries widely patent within the neck without stenosis, dissection, or occlusion. Skeleton: Visualized osseous structures within normal limits. No acute osseous abnormality. No worrisome lytic or blastic osseous lesions. Other neck: Visualized soft tissues of the neck demonstrate no acute abnormality. Thyroid normal. Salivary glands normal. No adenopathy. Upper chest: Visualized mediastinum within normal limits. Visualized lungs are clear. Mild emphysema noted. Review of the MIP images confirms the above findings CTA HEAD FINDINGS Anterior circulation: Petrous, cavernous, and supraclinoid segments of the internal carotid arteries are widely patent bilaterally without flow-limiting stenosis. Focal plaque noted at the supraclinoid right ICA with secondary mild stenosis. A1 segments patent. Right A1 segment mildly hypoplastic. Anterior communicating artery normal. Anterior cerebral arteries patent to their distal aspects. Proximal and mid left M1 segment widely patent. Proximal takeoff of the anterior division of the left MCA which is patent. There is a distal left M1/proximal left M2 occlusion near the base of the sylvian fissure (series 11, image 113). Attenuated flow distally within the left MCA branches. Right M1 segment widely patent without stenosis or occlusion. No proximal right M2 occlusion. Right MCA branches widely patent distally. Posterior circulation: Vertebral arteries patent to the vertebrobasilar junction. Left vertebral artery slightly dominant. Posterior inferior cerebral arteries patent bilaterally. Basilar artery widely patent. Superior cerebral arteries patent bilaterally. Left PCA supplied via the  basilar and is widely patent to its distal aspect. Hypoplastic right P1 segment with a mildly prominent right posterior communicating artery. Right PCA also supplied to its distal aspect. Venous sinuses: Patent. Anatomic variants: No significant anatomic variant. No aneurysm or vascular malformation. Delayed phase: Not performed. Review of the MIP images confirms the above findings IMPRESSION: 1. Positive CTA for emergent large vessel occlusion with distal left M1/ proximal left M2 occlusion as above. 2. Additional fairly mild atheromatous disease as above. No other significant flow-limiting stenosis or other abnormality identified. Critical Value/emergent results were called by telephone at the time of interpretation on 05/21/2016 at 6:34 pm to Dr. Melba Coon , who verbally acknowledged these results. Electronically Signed   By: Jeannine Boga M.D.   On: 05/21/2016 18:54   Ct Head Wo Contrast  Result Date: 05/22/2016 CLINICAL DATA:  Status post revascularization of the occluded LEFT M1 MCA achieving TICI3 reperfusion. EXAM: CT HEAD WITHOUT CONTRAST TECHNIQUE: Contiguous axial images were obtained from the base of the skull through the vertex without intravenous contrast. COMPARISON:  CT head without contrast 05/21/2016. CTA head neck 05/21/2016. Cerebral angiogram 05/21/2016. FINDINGS: Brain: No evidence for acute infarction, hemorrhage, mass lesion, hydrocephalus, or extra-axial fluid. Normal cerebral volume. No significant white matter disease. No postprocedural complication such as hemorrhage is evident. No definite areas of LEFT hemisphere hypoattenuation are seen. Chronic RIGHT inferior cerebellar infarct is stable. Vascular: Contrast-enhanced blood pool from recent CTA  and recent angiography. Proximal vessels appear patent, including LEFT MCA. Skull: Normal. Negative for fracture or focal lesion. Sinuses/Orbits: No acute finding. Other: None. IMPRESSION: Satisfactory appearance post thrombolysis  of occluded LEFT M1 MCA. No developing acute infarction or postprocedure hemorrhage is evident. Electronically Signed   By: Staci Righter M.D.   On: 05/22/2016 08:07   Ct Angio Neck W Or Wo Contrast  Result Date: 05/21/2016 CLINICAL DATA:  Acute facial droop, right-sided deficits. Prior CT from earlier the same day. EXAM: CT ANGIOGRAPHY HEAD AND NECK TECHNIQUE: Multidetector CT imaging of the head and neck was performed using the standard protocol during bolus administration of intravenous contrast. Multiplanar CT image reconstructions and MIPs were obtained to evaluate the vascular anatomy. Carotid stenosis measurements (when applicable) are obtained utilizing NASCET criteria, using the distal internal carotid diameter as the denominator. CONTRAST:  50 cc of Isovue 370.  Initial evaluation for COMPARISON:  None. FINDINGS: CTA NECK FINDINGS Aortic arch: Visualized aortic arch of normal caliber with normal 3 vessel morphology. No high-grade stenosis seen about the origin of the great vessels. Visualized subclavian arteries widely patent. Right carotid system: Right common carotid artery patent from its origin to the bifurcation. Mild a centric calcified plaque about the right bifurcation without significant stenosis. Right ICA patent distally to the skullbase without significant stenosis, dissection, or vascular occlusion. Left carotid system: Left common carotid artery patent from its origin to the bifurcation. No significant atheromatous narrowing about the left bifurcation. Left ICA patent from the bifurcation to the skullbase without stenosis, dissection, or occlusion. Vertebral arteries: Both of the vertebral arteries arise from the subclavian arteries. Vertebral arteries widely patent within the neck without stenosis, dissection, or occlusion. Skeleton: Visualized osseous structures within normal limits. No acute osseous abnormality. No worrisome lytic or blastic osseous lesions. Other neck: Visualized soft  tissues of the neck demonstrate no acute abnormality. Thyroid normal. Salivary glands normal. No adenopathy. Upper chest: Visualized mediastinum within normal limits. Visualized lungs are clear. Mild emphysema noted. Review of the MIP images confirms the above findings CTA HEAD FINDINGS Anterior circulation: Petrous, cavernous, and supraclinoid segments of the internal carotid arteries are widely patent bilaterally without flow-limiting stenosis. Focal plaque noted at the supraclinoid right ICA with secondary mild stenosis. A1 segments patent. Right A1 segment mildly hypoplastic. Anterior communicating artery normal. Anterior cerebral arteries patent to their distal aspects. Proximal and mid left M1 segment widely patent. Proximal takeoff of the anterior division of the left MCA which is patent. There is a distal left M1/proximal left M2 occlusion near the base of the sylvian fissure (series 11, image 113). Attenuated flow distally within the left MCA branches. Right M1 segment widely patent without stenosis or occlusion. No proximal right M2 occlusion. Right MCA branches widely patent distally. Posterior circulation: Vertebral arteries patent to the vertebrobasilar junction. Left vertebral artery slightly dominant. Posterior inferior cerebral arteries patent bilaterally. Basilar artery widely patent. Superior cerebral arteries patent bilaterally. Left PCA supplied via the basilar and is widely patent to its distal aspect. Hypoplastic right P1 segment with a mildly prominent right posterior communicating artery. Right PCA also supplied to its distal aspect. Venous sinuses: Patent. Anatomic variants: No significant anatomic variant. No aneurysm or vascular malformation. Delayed phase: Not performed. Review of the MIP images confirms the above findings IMPRESSION: 1. Positive CTA for emergent large vessel occlusion with distal left M1/ proximal left M2 occlusion as above. 2. Additional fairly mild atheromatous disease  as above. No other significant flow-limiting stenosis or  other abnormality identified. Critical Value/emergent results were called by telephone at the time of interpretation on 05/21/2016 at 6:34 pm to Dr. Melba Coon , who verbally acknowledged these results. Electronically Signed   By: Jeannine Boga M.D.   On: 05/21/2016 18:54   Portable Chest Xray  Result Date: 05/22/2016 CLINICAL DATA:  Intubation . EXAM: PORTABLE CHEST 1 VIEW COMPARISON:  05/21/2016 . FINDINGS: Endotracheal tube and NG tube in stable position. Cardiac pacer with lead tips over the right atrium right ventricle. Prior CABG. Cardiomegaly with mild pulmonary venous congestion and bilateral mild interstitial prominence consistent mild CHF. Small left pleural effusion. Low lung volumes with basilar atelectasis. No pneumothorax . IMPRESSION: 1. Cardiac pacer with lead tips in right atrium right ventricle. Prior CABG. Cardiomegaly with mild pulmonary venous congestion and bilateral interstitial prominence. Small left pleural effusion. Findings consistent with mild CHF. 2. Low lung volumes with basilar atelectasis again noted. Electronically Signed   By: Marcello Moores  Register   On: 05/22/2016 07:26   Portable Chest Xray  Result Date: 05/21/2016 CLINICAL DATA:  Respiratory failure EXAM: PORTABLE CHEST 1 VIEW COMPARISON:  09/05/2015 FINDINGS: Post sternotomy changes. There is a left-sided duo lead pacing device with leads projecting over the right atrium and right ventricle. Endotracheal tube tip partially obscured by overlying sternotomy, it is suspected to be approximately 3.8 cm superior to the carina. Esophageal tube tip is below the diaphragm but is not included. The right lung is grossly clear. There is dense left lower lobe consolidation and probable small effusion. There is mild cardiomegaly. IMPRESSION: 1. Dense left lower lobe consolidation may reflect atelectasis or a pneumonia. Suspect that there is a small left effusion 2. Mild  cardiomegaly. Electronically Signed   By: Donavan Foil M.D.   On: 05/21/2016 21:56   Dg Abd Portable 1v  Result Date: 05/21/2016 CLINICAL DATA:  Orogastric tube placement EXAM: PORTABLE ABDOMEN - 1 VIEW COMPARISON:  None. FINDINGS: The tip and side port of the or gastric tube project over the gastric body. Nonobstructive bowel gas pattern. No osseous abnormalities. IMPRESSION: Orogastric tube tip and side port overlie the gastric body. Electronically Signed   By: Ulyses Jarred M.D.   On: 05/21/2016 22:06   Ct Head Code Stroke W/o Cm  Result Date: 05/21/2016 CLINICAL DATA:  Code stroke.  Right-sided deficits. EXAM: CT HEAD WITHOUT CONTRAST TECHNIQUE: Contiguous axial images were obtained from the base of the skull through the vertex without intravenous contrast. COMPARISON:  None. FINDINGS: Brain: No evidence of acute infarction, hemorrhage, hydrocephalus, extra-axial collection or mass lesion/mass effect. Right inferior cerebellar encephalomalacia probably representing sequelae of chronic infarction. Vascular: No hyperdense vessel or unexpected calcification. Skull: Normal. Negative for fracture or focal lesion. Sinuses/Orbits: No acute finding. Other: None. ASPECTS [facility] Stroke Program Early CT Score) - Ganglionic level infarction (caudate, lentiform nuclei, internal capsule, insula, M1-M3 cortex): 7 - Supraganglionic infarction (M4-M6 cortex): 3 Total score (0-10 with 10 being normal): 10 IMPRESSION: 1. No acute intracranial abnormality identified. 2. Right inferior cerebellar encephalomalacia, probably representing sequelae of chronic infarction. 3. ASPECTS is 10 These results were called by telephone at the time of interpretation on 05/21/2016 at 6:19 pm to Dr. Tyrone Nine who verbally acknowledged these results. Electronically Signed   By: Kristine Garbe M.D.   On: 05/21/2016 18:24    Labs:  CBC:  Recent Labs  03/10/16 0644 04/15/16 0924 05/21/16 1804 05/21/16 1809 05/22/16 0408    WBC 7.6 9.1 11.3*  --  8.3  HGB 13.6 14.7  15.1 16.0 13.2  HCT 42.3 45.4 46.2 47.0 41.4  PLT 158 169 186  --  161    COAGS:  Recent Labs  09/05/15 1554 05/21/16 1804  INR 0.99 0.95  APTT  --  30    BMP:  Recent Labs  04/01/16 0904 04/15/16 0924 05/21/16 1804 05/21/16 1809 05/22/16 0408  NA 139 138 140 141 140  K 4.1 3.9 4.7 4.4 3.6  CL 106 106 106 103 106  CO2 27 23 27   --  25  GLUCOSE 70 100* 99 99 92  BUN 10 11 11 14 9   CALCIUM 9.1 9.2 9.5  --  9.1  CREATININE 1.17 1.22 1.37* 1.40* 1.01  GFRNONAA >60 >60 >60  --  >60  GFRAA >60 >60 >60  --  >60    LIVER FUNCTION TESTS:  Recent Labs  09/05/15 1554 03/08/16 2307 03/09/16 0300 05/21/16 1804  BILITOT 0.6 0.6 0.5 0.5  AST 53* 31 78* 21  ALT 37 67* 92* 19  ALKPHOS 59 70 78 57  PROT 6.3* 6.5 6.3* 7.2  ALBUMIN 3.1* 3.5 3.4* 3.9    Assessment and Plan:  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

## 2016-05-22 NOTE — Progress Notes (Signed)
8Fr rt femoral sheath removed at 1015 am using exoseal and manual pressure.  4 fr left sheath removed using v-pal and manual pressure at 1015.  Hemostasis obtained bilaterally at 1030.  Both sites dressed with gauze and tegaderm. Groin site clean and dry. No hematoma or complication.  Distal pulses intact.  Sites review with Casey Burkitt.

## 2016-05-22 NOTE — Progress Notes (Signed)
[facility] Progress Note Patient Name: John Davenport DOB: 1975-09-17 MRN: [id number]   Date of Service  05/22/2016  HPI/Events of Note  Agitated after extubation.  Hx of substance abuse.  eICU Interventions  Will add precedex with prn ativan, fentanyl.     Intervention Category Major Interventions: Other:  Kreig Parson 05/22/2016, 6:12 PM

## 2016-05-22 NOTE — Consult Note (Signed)
PULMONARY / CRITICAL CARE MEDICINE   Name: John Davenport MRN: [id number] DOB: 02-15-1976    ADMISSION DATE:  05/21/2016 CONSULTATION DATE:  05/21/16  REFERRING MD:  Shon Hale  CHIEF COMPLAINT:  Dysphasia   BRIEF  John Davenport is a 41 y.o. 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  He was brought to [facility] ED 02/21 with sudden onset right sided weakness and dysphasia.  In ED, he had CTA head showed proximal occlusion of the left M2 segment.  He received tPA and was then taken to IR for revascularization.  He returned to the ICU on the vent and PCCM was consulted for vent management.  EVENTS 05/21/2016 - admit  On vent, unresponsive.CTA head 02/21 > large vessel occlusion with distal left M1 / proximal left M2 occlusion   SUBJECTIVE/OVERNIGHT/INTERVAL HX 2/22 - On dipriva and neo - sbp goal > 110 per RN. Significant gitation on WUA. Sheath out and he has to be flat for next 4h   VITAL SIGNS: BP 101/70 (BP Location: Right Arm)   Pulse 60   Temp 97.4 F (36.3 C) (Oral)   Resp 16   Ht 6\' 2"  (1.88 m)   Wt 97 kg (213 lb 13.5 oz)   SpO2 100%   BMI 27.46 kg/m   HEMODYNAMICS:    VENTILATOR SETTINGS: Vent Mode: PRVC FiO2 (%):  [30 %-50 %] 30 % Set Rate:  [16 bmp] 16 bmp Vt Set:  [550 mL-660 mL] 550 mL PEEP:  [5 cmH20] 5 cmH20 Plateau Pressure:  [14 cmH20-19 cmH20] 17 cmH20  INTAKE / OUTPUT: I/O last 3 completed shifts: In: 2208.5 [I.V.:2208.5] Out: 2150 [Urine:2000; Blood:150]   PHYSICAL EXAMINATION:   General Appearance:    Looks criticall ill . Strong and muscular  Head:    Normocephalic, without obvious abnormality, atraumatic  Eyes:    PERRL - yes, conjunctiva/corneas - clear      Ears:    Normal external ear canals, both ears  Nose:   NG tube - no  Throat:  ETT TUBE - yes ,  Neck:   Supple,  No enlargement/tenderness/nodules     Lungs:     Clear to auscultation bilaterally,  Ventilator   Synchrony - yes  Chest wall:    No deformity  Heart:    S1 and S2 normal, no murmur, CVP - na.  Pressors - neo low dose due to diprivan  Abdomen:     Soft, no masses, no organomegaly  Genitalia:    Not done  Rectal:   not done  Extremities:   Extremities- strong, no cyanosis, no clubbing, no edema     Skin:   Intact in exposed areas .      Neurologic:   Sedation - diprivan gtt -> RASS - -3 . Moves all 4s - weak on right per RN. CAM-ICU - not asessed . Orientation - not assessed      LABS:  PULMONARY  Recent Labs Lab 05/21/16 1809 05/21/16 2229  PHART  --  7.386  PCO2ART  --  41.7  PO2ART  --  204.0*  HCO3  --  25.0  TCO2 27 26  O2SAT  --  100.0    CBC  Recent Labs Lab 05/21/16 1804 05/21/16 1809 05/22/16 0408  HGB 15.1 16.0 13.2  HCT 46.2 47.0 41.4  WBC 11.3*  --  8.3  PLT 186  --  161  COAGULATION  Recent Labs Lab 05/21/16 1804  INR 0.95    CARDIAC  No results for input(s): TROPONINI in the last 168 hours. No results for input(s): PROBNP in the last 168 hours.   CHEMISTRY  Recent Labs Lab 05/21/16 1804 05/21/16 1809 05/22/16 0408  NA 140 141 140  K 4.7 4.4 3.6  CL 106 103 106  CO2 27  --  25  GLUCOSE 99 99 92  BUN 11 14 9   CREATININE 1.37* 1.40* 1.01  CALCIUM 9.5  --  9.1   Estimated Creatinine Clearance: 110.8 mL/min (by C-G formula based on SCr of 1.01 mg/dL).   LIVER  Recent Labs Lab 05/21/16 1804  AST 21  ALT 19  ALKPHOS 57  BILITOT 0.5  PROT 7.2  ALBUMIN 3.9  INR 0.95     INFECTIOUS  Recent Labs Lab 05/21/16 2243 05/22/16 0408  PROCALCITON <0.10 <0.10     ENDOCRINE CBG (last 3)  No results for input(s): GLUCAP in the last 72 hours.       IMAGING x48h  - image(s) personally visualized  -   highlighted in bold Ct Angio Head W Or Wo Contrast  Result Date: 05/21/2016 CLINICAL DATA:  Acute facial droop, right-sided deficits. Prior CT from earlier the same day. EXAM: CT ANGIOGRAPHY HEAD AND  NECK TECHNIQUE: Multidetector CT imaging of the head and neck was performed using the standard protocol during bolus administration of intravenous contrast. Multiplanar CT image reconstructions and MIPs were obtained to evaluate the vascular anatomy. Carotid stenosis measurements (when applicable) are obtained utilizing NASCET criteria, using the distal internal carotid diameter as the denominator. CONTRAST:  50 cc of Isovue 370.  Initial evaluation for COMPARISON:  None. FINDINGS: CTA NECK FINDINGS Aortic arch: Visualized aortic arch of normal caliber with normal 3 vessel morphology. No high-grade stenosis seen about the origin of the great vessels. Visualized subclavian arteries widely patent. Right carotid system: Right common carotid artery patent from its origin to the bifurcation. Mild a centric calcified plaque about the right bifurcation without significant stenosis. Right ICA patent distally to the skullbase without significant stenosis, dissection, or vascular occlusion. Left carotid system: Left common carotid artery patent from its origin to the bifurcation. No significant atheromatous narrowing about the left bifurcation. Left ICA patent from the bifurcation to the skullbase without stenosis, dissection, or occlusion. Vertebral arteries: Both of the vertebral arteries arise from the subclavian arteries. Vertebral arteries widely patent within the neck without stenosis, dissection, or occlusion. Skeleton: Visualized osseous structures within normal limits. No acute osseous abnormality. No worrisome lytic or blastic osseous lesions. Other neck: Visualized soft tissues of the neck demonstrate no acute abnormality. Thyroid normal. Salivary glands normal. No adenopathy. Upper chest: Visualized mediastinum within normal limits. Visualized lungs are clear. Mild emphysema noted. Review of the MIP images confirms the above findings CTA HEAD FINDINGS Anterior circulation: Petrous, cavernous, and supraclinoid  segments of the internal carotid arteries are widely patent bilaterally without flow-limiting stenosis. Focal plaque noted at the supraclinoid right ICA with secondary mild stenosis. A1 segments patent. Right A1 segment mildly hypoplastic. Anterior communicating artery normal. Anterior cerebral arteries patent to their distal aspects. Proximal and mid left M1 segment widely patent. Proximal takeoff of the anterior division of the left MCA which is patent. There is a distal left M1/proximal left M2 occlusion near the base of the sylvian fissure (series 11, image 113). Attenuated flow distally within the left MCA branches. Right M1 segment widely patent without stenosis or occlusion.  No proximal right M2 occlusion. Right MCA branches widely patent distally. Posterior circulation: Vertebral arteries patent to the vertebrobasilar junction. Left vertebral artery slightly dominant. Posterior inferior cerebral arteries patent bilaterally. Basilar artery widely patent. Superior cerebral arteries patent bilaterally. Left PCA supplied via the basilar and is widely patent to its distal aspect. Hypoplastic right P1 segment with a mildly prominent right posterior communicating artery. Right PCA also supplied to its distal aspect. Venous sinuses: Patent. Anatomic variants: No significant anatomic variant. No aneurysm or vascular malformation. Delayed phase: Not performed. Review of the MIP images confirms the above findings IMPRESSION: 1. Positive CTA for emergent large vessel occlusion with distal left M1/ proximal left M2 occlusion as above. 2. Additional fairly mild atheromatous disease as above. No other significant flow-limiting stenosis or other abnormality identified. Critical Value/emergent results were called by telephone at the time of interpretation on 05/21/2016 at 6:34 pm to Dr. Melba Coon , who verbally acknowledged these results. Electronically Signed   By: Jeannine Boga M.D.   On: 05/21/2016 18:54   Ct  Head Wo Contrast  Result Date: 05/22/2016 CLINICAL DATA:  Status post revascularization of the occluded LEFT M1 MCA achieving TICI3 reperfusion. EXAM: CT HEAD WITHOUT CONTRAST TECHNIQUE: Contiguous axial images were obtained from the base of the skull through the vertex without intravenous contrast. COMPARISON:  CT head without contrast 05/21/2016. CTA head neck 05/21/2016. Cerebral angiogram 05/21/2016. FINDINGS: Brain: No evidence for acute infarction, hemorrhage, mass lesion, hydrocephalus, or extra-axial fluid. Normal cerebral volume. No significant white matter disease. No postprocedural complication such as hemorrhage is evident. No definite areas of LEFT hemisphere hypoattenuation are seen. Chronic RIGHT inferior cerebellar infarct is stable. Vascular: Contrast-enhanced blood pool from recent CTA and recent angiography. Proximal vessels appear patent, including LEFT MCA. Skull: Normal. Negative for fracture or focal lesion. Sinuses/Orbits: No acute finding. Other: None. IMPRESSION: Satisfactory appearance post thrombolysis of occluded LEFT M1 MCA. No developing acute infarction or postprocedure hemorrhage is evident. Electronically Signed   By: Staci Righter M.D.   On: 05/22/2016 08:07   Ct Angio Neck W Or Wo Contrast  Result Date: 05/21/2016 CLINICAL DATA:  Acute facial droop, right-sided deficits. Prior CT from earlier the same day. EXAM: CT ANGIOGRAPHY HEAD AND NECK TECHNIQUE: Multidetector CT imaging of the head and neck was performed using the standard protocol during bolus administration of intravenous contrast. Multiplanar CT image reconstructions and MIPs were obtained to evaluate the vascular anatomy. Carotid stenosis measurements (when applicable) are obtained utilizing NASCET criteria, using the distal internal carotid diameter as the denominator. CONTRAST:  50 cc of Isovue 370.  Initial evaluation for COMPARISON:  None. FINDINGS: CTA NECK FINDINGS Aortic arch: Visualized aortic arch of  normal caliber with normal 3 vessel morphology. No high-grade stenosis seen about the origin of the great vessels. Visualized subclavian arteries widely patent. Right carotid system: Right common carotid artery patent from its origin to the bifurcation. Mild a centric calcified plaque about the right bifurcation without significant stenosis. Right ICA patent distally to the skullbase without significant stenosis, dissection, or vascular occlusion. Left carotid system: Left common carotid artery patent from its origin to the bifurcation. No significant atheromatous narrowing about the left bifurcation. Left ICA patent from the bifurcation to the skullbase without stenosis, dissection, or occlusion. Vertebral arteries: Both of the vertebral arteries arise from the subclavian arteries. Vertebral arteries widely patent within the neck without stenosis, dissection, or occlusion. Skeleton: Visualized osseous structures within normal limits. No acute osseous abnormality. No worrisome lytic  or blastic osseous lesions. Other neck: Visualized soft tissues of the neck demonstrate no acute abnormality. Thyroid normal. Salivary glands normal. No adenopathy. Upper chest: Visualized mediastinum within normal limits. Visualized lungs are clear. Mild emphysema noted. Review of the MIP images confirms the above findings CTA HEAD FINDINGS Anterior circulation: Petrous, cavernous, and supraclinoid segments of the internal carotid arteries are widely patent bilaterally without flow-limiting stenosis. Focal plaque noted at the supraclinoid right ICA with secondary mild stenosis. A1 segments patent. Right A1 segment mildly hypoplastic. Anterior communicating artery normal. Anterior cerebral arteries patent to their distal aspects. Proximal and mid left M1 segment widely patent. Proximal takeoff of the anterior division of the left MCA which is patent. There is a distal left M1/proximal left M2 occlusion near the base of the sylvian  fissure (series 11, image 113). Attenuated flow distally within the left MCA branches. Right M1 segment widely patent without stenosis or occlusion. No proximal right M2 occlusion. Right MCA branches widely patent distally. Posterior circulation: Vertebral arteries patent to the vertebrobasilar junction. Left vertebral artery slightly dominant. Posterior inferior cerebral arteries patent bilaterally. Basilar artery widely patent. Superior cerebral arteries patent bilaterally. Left PCA supplied via the basilar and is widely patent to its distal aspect. Hypoplastic right P1 segment with a mildly prominent right posterior communicating artery. Right PCA also supplied to its distal aspect. Venous sinuses: Patent. Anatomic variants: No significant anatomic variant. No aneurysm or vascular malformation. Delayed phase: Not performed. Review of the MIP images confirms the above findings IMPRESSION: 1. Positive CTA for emergent large vessel occlusion with distal left M1/ proximal left M2 occlusion as above. 2. Additional fairly mild atheromatous disease as above. No other significant flow-limiting stenosis or other abnormality identified. Critical Value/emergent results were called by telephone at the time of interpretation on 05/21/2016 at 6:34 pm to Dr. Melba Coon , who verbally acknowledged these results. Electronically Signed   By: Jeannine Boga M.D.   On: 05/21/2016 18:54   Portable Chest Xray  Result Date: 05/22/2016 CLINICAL DATA:  Intubation . EXAM: PORTABLE CHEST 1 VIEW COMPARISON:  05/21/2016 . FINDINGS: Endotracheal tube and NG tube in stable position. Cardiac pacer with lead tips over the right atrium right ventricle. Prior CABG. Cardiomegaly with mild pulmonary venous congestion and bilateral mild interstitial prominence consistent mild CHF. Small left pleural effusion. Low lung volumes with basilar atelectasis. No pneumothorax . IMPRESSION: 1. Cardiac pacer with lead tips in right atrium right  ventricle. Prior CABG. Cardiomegaly with mild pulmonary venous congestion and bilateral interstitial prominence. Small left pleural effusion. Findings consistent with mild CHF. 2. Low lung volumes with basilar atelectasis again noted. Electronically Signed   By: Marcello Moores  Register   On: 05/22/2016 07:26   Portable Chest Xray  Result Date: 05/21/2016 CLINICAL DATA:  Respiratory failure EXAM: PORTABLE CHEST 1 VIEW COMPARISON:  09/05/2015 FINDINGS: Post sternotomy changes. There is a left-sided duo lead pacing device with leads projecting over the right atrium and right ventricle. Endotracheal tube tip partially obscured by overlying sternotomy, it is suspected to be approximately 3.8 cm superior to the carina. Esophageal tube tip is below the diaphragm but is not included. The right lung is grossly clear. There is dense left lower lobe consolidation and probable small effusion. There is mild cardiomegaly. IMPRESSION: 1. Dense left lower lobe consolidation may reflect atelectasis or a pneumonia. Suspect that there is a small left effusion 2. Mild cardiomegaly. Electronically Signed   By: Donavan Foil M.D.   On: 05/21/2016 21:56  Dg Abd Portable 1v  Result Date: 05/21/2016 CLINICAL DATA:  Orogastric tube placement EXAM: PORTABLE ABDOMEN - 1 VIEW COMPARISON:  None. FINDINGS: The tip and side port of the or gastric tube project over the gastric body. Nonobstructive bowel gas pattern. No osseous abnormalities. IMPRESSION: Orogastric tube tip and side port overlie the gastric body. Electronically Signed   By: Ulyses Jarred M.D.   On: 05/21/2016 22:06   Ct Head Code Stroke W/o Cm  Result Date: 05/21/2016 CLINICAL DATA:  Code stroke.  Right-sided deficits. EXAM: CT HEAD WITHOUT CONTRAST TECHNIQUE: Contiguous axial images were obtained from the base of the skull through the vertex without intravenous contrast. COMPARISON:  None. FINDINGS: Brain: No evidence of acute infarction, hemorrhage, hydrocephalus,  extra-axial collection or mass lesion/mass effect. Right inferior cerebellar encephalomalacia probably representing sequelae of chronic infarction. Vascular: No hyperdense vessel or unexpected calcification. Skull: Normal. Negative for fracture or focal lesion. Sinuses/Orbits: No acute finding. Other: None. ASPECTS [facility] Stroke Program Early CT Score) - Ganglionic level infarction (caudate, lentiform nuclei, internal capsule, insula, M1-M3 cortex): 7 - Supraganglionic infarction (M4-M6 cortex): 3 Total score (0-10 with 10 being normal): 10 IMPRESSION: 1. No acute intracranial abnormality identified. 2. Right inferior cerebellar encephalomalacia, probably representing sequelae of chronic infarction. 3. ASPECTS is 10 These results were called by telephone at the time of interpretation on 05/21/2016 at 6:19 pm to Dr. Tyrone Nine who verbally acknowledged these results. Electronically Signed   By: Kristine Garbe M.D.   On: 05/21/2016 18:24   DISCUSSION: 41 y.o. male with extensive PMH, admitted 02/21 with left M2 occlusion.  He received tPA then was taken to IR for revascularization.  Returned to the ICU on the vent and PCCM asked to see for vent management.   Patient Active Problem List   Diagnosis Date Noted  . Acute ischemic stroke ([place]) 05/21/2016  . Acute hypoxemic respiratory failure ([place])   . Cerebrovascular accident (CVA) due to thrombosis of left middle cerebral artery ([place])   . Chronic systolic congestive heart failure ([place])   . Cough with sputum 04/17/2016  . Intractable upper abdominal pain 03/09/2016  . CKD (chronic kidney disease), stage III 03/09/2016  . 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 RUQ abdominal pain 03/09/2016  . NSTEMI (non-ST elevated myocardial infarction) ([place]) 09/05/2015  . Ischemic cardiomyopathy 09/05/2015  . Tobacco abuse 09/05/2015  . Hyperlipidemia 09/05/2015  . Renal insufficiency 09/05/2015  . OSA (obstructive sleep apnea) 12/30/2010  . Cough  12/09/2010  . Dental abscess 12/09/2010  . GERD 04/10/2010  . PAROXYSMAL VENTRICULAR TACHYCARDIA 03/22/2010  . Chronic combined systolic and diastolic CHF (congestive heart failure) ([place]) 03/22/2010  . Generalized anxiety disorder 05/16/2009  . MYOCARDIAL INFARCTION 12/19/2008  . CAD in native artery 12/19/2008     ASSESSMENT / PLAN:  NEUROLOGIC A:   L2 occlusion - s/p tPA and IR revascularization. Acute encephalopathy due to sedation. Polysubstance abuse - UDS in past positive for opiates and cocaine.   - no change,.  Sheath out P:   Neuro following / managing. Sedation:  Propofol gtt / Fentanyl PRN. RASS goal: 0 to -1. Daily WUA. Assess UDS.  PULMONARY A: VDRF - due to left M2 occlusion. Hx OSA - unsure if on CPAP. Tobacco dependence.  2/22 - agitated on wua but from stroke service can be exdtubated once 4h passess after sheath removal  P:   Full vent support. Wean as able. Asesses for extubation > 3pm 05/22/2016 VAP prevention measures. Tobacco cessation counseling once extubated.  CARDIOVASCULAR A:  Hx PVT, combined CHF with ICM (Echo from June 2017 with EF 20%, G2DD), CAD, HLD.   - needing neo due to diprivan P:  500cc fluid bolus 05/22/2016  Goal sBP > 110 per RN Monitor hemodynamics. Continue preadmission aspirin, atorvastatin. Hold preadmission carvedilol, furosemide, imdur, nitro, entresto, spironolactone.  RENAL A:   AKI.   - resolved 05/22/2016  P:   NS kvo after bolus BMP in AM.  GASTROINTESTINAL A:   GI prophylaxis. Nutrition. P:   SUP: Pantoprazole. NPO.  HEMATOLOGIC A:   VTE Prophylaxis. P:  SCD's / heparin. CBC in AM.  INFECTIOUS A:   No indication of infection. P:   No interventions required.  ENDOCRINE A:   No acute issues.  P:   No interventions required.  Family updated: None available.  Interdisciplinary Family Meeting v Palliative Care Meeting:  Due by: 05/27/16.    The patient is critically ill with  multiple organ systems failure and requires high complexity decision making for assessment and support, frequent evaluation and titration of therapies, application of advanced monitoring technologies and extensive interpretation of multiple databases.   Critical Care Time devoted to patient care services described in this note is  30  Minutes. This time reflects time of care of this signee Dr Brand Males. This critical care time does not reflect procedure time, or teaching time or supervisory time of PA/NP/Med student/Med Resident etc but could involve care discussion time    Dr. Brand Males, M.D., [facility].C.P Pulmonary and Critical Care Medicine Staff Physician [place] [facility] Pager: [phone], If no answer or between  15:00h - 7:00h: call [phone]  05/22/2016 11:06 AM

## 2016-05-22 NOTE — Progress Notes (Signed)
OT Cancellation Note  Patient Details Name: John Davenport MRN: [id number] DOB: [birth year]   Cancelled Treatment:    Reason Eval/Treat Not Completed: Patient not medically ready. Pt still intubated, sedated, and sheath not yet removed.   Almon Register [id number] 05/22/2016, 8:32 AM

## 2016-05-22 NOTE — Progress Notes (Signed)
Patient ID: John Davenport, male   DOB: Nov 15, 1975, 41 y.o.   MRN: [id number]   Called to assess for extubation On 35 prop Follows commands, strong cough, low secretions, much improved strength right Meets criteria extubation Will place order  Lavon Paganini. Titus Mould, MD, [place] Pgr: [place] [facility]

## 2016-05-22 NOTE — Progress Notes (Signed)
Transported pt to and from 3M05 to CT1 on ventilator. Pt stable throughout with no complications. VS within normal limits. RT will continue to monitor

## 2016-05-22 NOTE — Progress Notes (Signed)
  Echocardiogram 2D Echocardiogram has been performed.  John Davenport 05/22/2016, 12:31 PM

## 2016-05-22 NOTE — Progress Notes (Signed)
PT Cancellation Note  Patient Details Name: John Davenport MRN: [id number] DOB: [birth year]   Cancelled Treatment:    Reason Eval/Treat Not Completed: Patient not medically ready.  Pt currently sedated and currently on bedrest post sheath removal.     [name] F [name] 05/22/2016, 10:33 AM

## 2016-05-22 NOTE — Progress Notes (Signed)
Sputum collection attempted.  Insufficient return of secretions for proper testing.  Will leave leukens trap in line for later attempts.

## 2016-05-22 NOTE — Progress Notes (Signed)
STROKE TEAM PROGRESS NOTE   SUBJECTIVE (INTERVAL HISTORY) His mother and her friend are at the bedside.  She recounted his long history of heart surgeries in the past.   OBJECTIVE Temp:  [97.4 F (36.3 C)-98.7 F (37.1 C)] 97.4 F (36.3 C) (02/22 0722) Pulse Rate:  [54-73] 60 (02/22 0800) Resp:  [12-21] 16 (02/22 0800) BP: (90-167)/(63-105) 101/70 (02/22 0800) SpO2:  [93 %-100 %] 100 % (02/22 0722) Arterial Line BP: (90-133)/(53-90) 111/61 (02/22 0800) FiO2 (%):  [30 %-50 %] 30 % (02/22 0722) Weight:  [97 kg (213 lb 13.5 oz)] 97 kg (213 lb 13.5 oz) (02/21 1823)  CBC:   Recent Labs Lab 05/21/16 1804 05/21/16 1809 05/22/16 0408  WBC 11.3*  --  8.3  NEUTROABS 7.4  --  4.1  HGB 15.1 16.0 13.2  HCT 46.2 47.0 41.4  MCV 90.1  --  89.8  PLT 186  --  [id number]    Basic Metabolic Panel:   Recent Labs Lab 05/21/16 1804 05/21/16 1809 05/22/16 0408  NA 140 141 140  K 4.7 4.4 3.6  CL 106 103 106  CO2 27  --  25  GLUCOSE 99 99 92  BUN 11 14 9   CREATININE 1.37* 1.40* 1.01  CALCIUM 9.5  --  9.1    HgbA1c:  Lab Results  Component Value Date   HGBA1C  04/09/2009    5.4 (NOTE) The ADA recommends the following therapeutic goal for glycemic control related to Hgb A1c measurement: Goal of therapy: <6.5 Hgb A1c  Reference: American Diabetes Association: Clinical Practice Recommendations 2010, Diabetes Care, 2010, 33: (Suppl  1).     Portable Chest Xray  Result Date: 05/22/2016 CLINICAL DATA:  Intubation . EXAM: PORTABLE CHEST 1 VIEW COMPARISON:  05/21/2016 . FINDINGS: Endotracheal tube and NG tube in stable position. Cardiac pacer with lead tips over the right atrium right ventricle. Prior CABG. Cardiomegaly with mild pulmonary venous congestion and bilateral mild interstitial prominence consistent mild CHF. Small left pleural effusion. Low lung volumes with basilar atelectasis. No pneumothorax . IMPRESSION: 1. Cardiac pacer with lead tips in right atrium right ventricle. Prior CABG.  Cardiomegaly with mild pulmonary venous congestion and bilateral interstitial prominence. Small left pleural effusion. Findings consistent with mild CHF. 2. Low lung volumes with basilar atelectasis again noted. Electronically Signed   By: Marcello Moores  Register   On: 05/22/2016 07:26    PHYSICAL EXAM Middle aged frail  [place] male intubated and sedated. He is on propofol drip. He has a right groin arterial sheath in place. . Afebrile. Head is nontraumatic. Neck is supple without bruit.    Cardiac exam no murmur or gallop. Lungs are clear to auscultation. Distal pulses are well felt. Neurological Exam :  Patient is sedated on propofol. Intubated. Comatose. 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 Pupils 3 mm equal reactive. Doll's eye movements are sluggish. Corneals present bilaterally. Fundi could not be visualized. No spontaneous extremity movements. Withdraws both lower extremities but left greater than right to painful stimuli. Withdraws left upper extremity well to painful stimuli. Mild withdrawal of the right upper extremity to pain. Deep tendon reflexes are 1+ symmetric. Plantars are downgoing. ASSESSMENT/PLAN Mr. ANTOINETTE WALLS is a 41 y.o. 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 He received IV t-PA 05/21/2016 at 1850. CTA showed ELVO LM1/M2. Taken to IR s/p Lt common carotid arteriogram,followed by TICI 3 revascularization  with  x 1 pass with solitaire.  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 Workup underway  Resultant right hemiparesis  Code Stroke CT no acute abnormality. R inferior cerebellar encephalomalacia. Aspects 10.  CTA head and neck ELVO distal L M1/proximal L M2  Cerebral angio L M1/M2 occlusion s/p TICI 3 revascularization with solitaire  Post IR CT satisfactory appearance post IR. No hemorrhage.  24h CT pending    2D Echo  pending   LDL 78  HgbA1c pending  SCDs for VTE prophylaxis Diet NPO time specified  aspirin 81 mg daily prior to admission, now on aspirin 81 mg daily (received 2 doses, 1 at 0015 and 1 at 1059, ordered by Lorna Dibble, PA. Discussed with RN). Will hold antithrombotics until 24h CT imaging negative for hemorrhage  D/c sheath  Therapy recommendations:  pending   Disposition:  pending   Acute respiratory Failure  Intubated for neuro intervention  CCM on board  Hypertension  Ran low during the night, on neo, cannot tolerate increased fluids d/t low EF  From stroke standpoint, Permissive hypertension (OK if < 220/120) but gradually normalize in 5-7 days   Hyperlipidemia  Home meds:  lipitor 40, resumed in hospital  LDL just above goal  Continue statin at discharge  Other Stroke Risk Factors  Cigarette smoker, advised to stop smoking  Smokeless tobacco user  UDS positive for opiates and cocaine  Obstructive sleep apnea, ? CPAP at home  Cardiac disease  Coronary artery disease - s/p CABG, stent  Chronic systolic CHF - EF 123456  Hx PVT defrillator placement   Other Active Problems  AKI  Hospital day # [place] for Pager information 05/22/2016 2:00 PM  I have personally examined this patient, reviewed notes, independently viewed imaging studies, participated in medical decision making and plan of care.ROS completed by me personally and pertinent positives fully documented  I have made any additions or clarifications directly to the above note. Agree with note above. He presented with right hemiparesis and facial droop secondary to left MCA infarct due to embolic left M2 occlusion treated with IV TPA and successful mechanical embolectomy with complete revascularization. Continue close neurological monitoring and strict blood pressure control as per post intervention and TPA protocol. Discontinue arterial groin  sheath today. Extubate later this afternoon if tolerated. Repeat CT scan later this evening. Patient cannot have MRI due to pacemaker. Start aspirin if no bleed on the CT scan. May need TEE and prolonged cardiac monitoring for cardiac source of embolism his echocardiogram is unyielding. I had a long discussion the bedside with the patient's mother and answered questions about his clinical presentation, treatment and evaluation plan. This patient is critically ill and at significant risk of neurological worsening, death and care requires constant monitoring of vital signs, hemodynamics,respiratory and cardiac monitoring, extensive review of multiple databases, frequent neurological assessment, discussion with family, other specialists and medical decision making of high complexity.I have made any additions or clarifications directly to the above note.This critical care time does not reflect procedure time, or teaching time or supervisory time of PA/NP/Med Resident etc but could involve care discussion time.  I spent 50 minutes of neurocritical care time  in the care of  this patient.     Antony Contras, MD Medical Director [facility] Pager: [phone] 05/22/2016 2:24 PM  To contact Stroke Continuity provider, please refer to [email or web address] After hours, contact General Neurology

## 2016-05-23 ENCOUNTER — Encounter (HOSPITAL_COMMUNITY): Payer: Self-pay | Admitting: Interventional Radiology

## 2016-05-23 ENCOUNTER — Other Ambulatory Visit (HOSPITAL_COMMUNITY): Payer: Self-pay

## 2016-05-23 DIAGNOSIS — Z720 Tobacco use: Secondary | ICD-10-CM | POA: Diagnosis present

## 2016-05-23 DIAGNOSIS — I959 Hypotension, unspecified: Secondary | ICD-10-CM | POA: Diagnosis not present

## 2016-05-23 DIAGNOSIS — F1410 Cocaine abuse, uncomplicated: Secondary | ICD-10-CM | POA: Diagnosis present

## 2016-05-23 LAB — BASIC METABOLIC PANEL
Anion gap: 10 (ref 5–15)
BUN: 8 mg/dL (ref 6–20)
CO2: 24 mmol/L (ref 22–32)
CREATININE: 1.17 mg/dL (ref 0.61–1.24)
Calcium: 9.3 mg/dL (ref 8.9–10.3)
Chloride: 109 mmol/L (ref 101–111)
GFR calc Af Amer: >60 mL/min (ref >60)
GLUCOSE: 94 mg/dL (ref 65–99)
POTASSIUM: 4.2 mmol/L (ref 3.5–5.1)
SODIUM: 143 mmol/L (ref 135–145)

## 2016-05-23 LAB — CBC WITH DIFFERENTIAL/PLATELET
Basophils Absolute: 0 10*3/uL (ref 0.0–0.1)
Basophils Relative: 0 %
EOS ABS: 0.4 10*3/uL (ref 0.0–0.7)
EOS PCT: 3 %
HCT: 47.1 % (ref 39.0–52.0)
Hemoglobin: 15.3 g/dL (ref 13.0–17.0)
LYMPHS ABS: 3.2 10*3/uL (ref 0.7–4.0)
LYMPHS PCT: 22 %
MCH: 29.1 pg (ref 26.0–34.0)
MCHC: 32.5 g/dL (ref 30.0–36.0)
MCV: 89.7 fL (ref 78.0–100.0)
MONO ABS: 1 10*3/uL (ref 0.1–1.0)
MONOS PCT: 7 %
Neutro Abs: 10 10*3/uL — ABNORMAL HIGH (ref 1.7–7.7)
Neutrophils Relative %: 68 %
PLATELETS: 187 10*3/uL (ref 150–400)
RBC: 5.25 MIL/uL (ref 4.22–5.81)
RDW: 13.8 % (ref 11.5–15.5)
WBC: 14.8 10*3/uL — AB (ref 4.0–10.5)

## 2016-05-23 LAB — GLUCOSE, CAPILLARY: Glucose-Capillary: 97 mg/dL (ref 65–99)

## 2016-05-23 LAB — PROCALCITONIN

## 2016-05-23 LAB — HEMOGLOBIN A1C
HEMOGLOBIN A1C: 5.4 % (ref 4.8–5.6)
MEAN PLASMA GLUCOSE: 108

## 2016-05-23 LAB — MAGNESIUM: Magnesium: 2 mg/dL (ref 1.7–2.4)

## 2016-05-23 LAB — PHOSPHORUS: Phosphorus: 3.5 mg/dL (ref 2.5–4.6)

## 2016-05-23 MED ORDER — ATORVASTATIN CALCIUM 40 MG PO TABS: 40.0000 mg | ORAL_TABLET | Freq: Every day | ORAL | Status: DC

## 2016-05-23 MED ORDER — WARFARIN - PHARMACIST DOSING INPATIENT: Freq: Every day | Status: DC

## 2016-05-23 MED ORDER — WARFARIN SODIUM 7.5 MG PO TABS
7.5000 mg | ORAL_TABLET | Freq: Every day | ORAL | Status: DC
  Filled 2016-05-23: qty 1

## 2016-05-23 MED ORDER — WARFARIN SODIUM 7.5 MG PO TABS: 7.5000 mg | ORAL_TABLET | Freq: Every day | ORAL | 2 refills | Status: DC

## 2016-05-23 MED ORDER — ASPIRIN EC 81 MG PO TBEC
81.0000 mg | DELAYED_RELEASE_TABLET | Freq: Every day | ORAL | Status: DC
  Administered 2016-05-23: 81 mg via ORAL
  Filled 2016-05-23: qty 1

## 2016-05-23 MED ORDER — NICOTINE 14 MG/24HR TD PT24
14.0000 mg | MEDICATED_PATCH | TRANSDERMAL | 0 refills | Status: DC
Start: 2016-05-23 — End: 2016-06-03

## 2016-05-23 MED ORDER — SODIUM CHLORIDE 0.9 % IV SOLN: 0.0000 ug/min | INTRAVENOUS | Status: DC

## 2016-05-23 MED ORDER — PATIENT'S GUIDE TO USING COUMADIN BOOK
Freq: Once | Status: AC
  Administered 2016-05-23: 12:00:00
  Filled 2016-05-23: qty 1

## 2016-05-23 MED ORDER — PANTOPRAZOLE SODIUM 40 MG PO TBEC: 40.0000 mg | DELAYED_RELEASE_TABLET | Freq: Every day | ORAL | Status: DC

## 2016-05-23 MED FILL — WARFARIN SODIUM 7.5 MG TAB: 7.5 | 30 days supply | Qty: 30 | Fill #0

## 2016-05-23 NOTE — Progress Notes (Signed)
Attempted to see pt today.  PT stood pt and BP dropped to 70/40.  Nursing asked to defer for now.  Will check back as schedule allows. Jinger Neighbors, [place] [id number]

## 2016-05-23 NOTE — Progress Notes (Signed)
Referring Physician(s): Dr Royal Hawthorn  Supervising Physician: Luanne Bras  Patient Status:  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  Medications: Prior to Admission medications   Medication Sig Start Date End Date Taking? Authorizing Provider  aspirin EC 81 MG EC tablet Take 1 tablet (81 mg total) by mouth daily. 09/07/15  Yes Erma Heritage, PA  atorvastatin (LIPITOR) 40 MG tablet Take 1 tablet (40 mg total) by mouth daily. 03/11/16  Yes Larey Dresser, MD  carvedilol (COREG) 6.25 MG tablet Take 1.5 tablets (9.375 mg total) by mouth 2 (two) times daily. 04/01/16  Yes Amy D Clegg, NP  furosemide (LASIX) 40 MG tablet Take 40 mg by mouth daily.    Yes Historical Provider, MD  isosorbide mononitrate (IMDUR) 60 MG 24 hr tablet Take 1 tablet (60 mg total) by mouth daily. 03/11/16 03/11/17 Yes Larey Dresser, MD  KLOR-CON M20 20 MEQ tablet Take 20 mEq by mouth daily. 04/15/16  Yes Historical Provider, MD  spironolactone (ALDACTONE) 25 MG tablet Take 0.5 tablets (12.5 mg total) by mouth daily. 03/11/16 06/09/16 Yes Larey Dresser, MD  guaiFENesin (MUCINEX) 600 MG 12 hr tablet Take 1 tablet (600 mg total) by mouth 2 (two) times daily. Patient not taking: Reported on 05/22/2016 04/15/16   Maryellen Pile, MD  nitroGLYCERIN (NITROSTAT) 0.4 MG SL tablet Place 1 tablet (0.4 mg total) under the tongue every 5 (five) minutes as needed for chest pain. 09/24/15   Brittainy M Simmons, PA-C  sacubitril-valsartan (ENTRESTO) 49-51 MG Take 1 tablet by mouth 2 (two) times daily. Patient not taking: Reported on 05/22/2016 04/15/16   Amy D Ninfa Meeker, NP  varenicline (CHANTIX CONTINUING MONTH PAK) 1 MG tablet Take 1 tablet (1 mg total) by mouth 2 (two) times daily. Patient not taking: Reported on 05/22/2016 03/17/16   Larey Dresser, MD     Vital Signs: BP  109/78   Pulse 63   Temp 97.9 F (36.6 C) (Oral)   Resp 20   Ht 6\' 2"  (1.88 m)   Wt 213 lb 13.5 oz (97 kg)   SpO2 99%   BMI 27.46 kg/m   Physical Exam  Constitutional: He is oriented to person, place, and time. He appears well-nourished.  Face symmetrical Tongue midline  Musculoskeletal: Normal range of motion.  Good grip =B Good sensation =B Good strength =B  Neurological: He is alert and oriented to person, place, and time.  Skin: Skin is warm and dry.  Psychiatric: He has a normal mood and affect. His behavior is normal.  Nursing note and vitals reviewed.   Imaging: Ct Angio Head W Or Wo Contrast  Result Date: 05/21/2016 CLINICAL DATA:  Acute facial droop, right-sided deficits. Prior CT from earlier the same day. EXAM: CT ANGIOGRAPHY HEAD AND NECK TECHNIQUE: Multidetector CT imaging of the head and neck was performed using the standard protocol during bolus administration of intravenous contrast. Multiplanar CT image reconstructions and MIPs were obtained to evaluate the vascular anatomy. Carotid stenosis measurements (when applicable) are obtained utilizing NASCET criteria, using the distal internal carotid diameter as the denominator. CONTRAST:  50 cc of Isovue 370.  Initial evaluation for COMPARISON:  None. FINDINGS: CTA NECK FINDINGS Aortic arch: Visualized aortic arch of normal caliber with normal 3 vessel morphology. No high-grade stenosis seen about the origin of the great  vessels. Visualized subclavian arteries widely patent. Right carotid system: Right common carotid artery patent from its origin to the bifurcation. Mild a centric calcified plaque about the right bifurcation without significant stenosis. Right ICA patent distally to the skullbase without significant stenosis, dissection, or vascular occlusion. Left carotid system: Left common carotid artery patent from its origin to the bifurcation. No significant atheromatous narrowing about the left bifurcation. Left ICA  patent from the bifurcation to the skullbase without stenosis, dissection, or occlusion. Vertebral arteries: Both of the vertebral arteries arise from the subclavian arteries. Vertebral arteries widely patent within the neck without stenosis, dissection, or occlusion. Skeleton: Visualized osseous structures within normal limits. No acute osseous abnormality. No worrisome lytic or blastic osseous lesions. Other neck: Visualized soft tissues of the neck demonstrate no acute abnormality. Thyroid normal. Salivary glands normal. No adenopathy. Upper chest: Visualized mediastinum within normal limits. Visualized lungs are clear. Mild emphysema noted. Review of the MIP images confirms the above findings CTA HEAD FINDINGS Anterior circulation: Petrous, cavernous, and supraclinoid segments of the internal carotid arteries are widely patent bilaterally without flow-limiting stenosis. Focal plaque noted at the supraclinoid right ICA with secondary mild stenosis. A1 segments patent. Right A1 segment mildly hypoplastic. Anterior communicating artery normal. Anterior cerebral arteries patent to their distal aspects. Proximal and mid left M1 segment widely patent. Proximal takeoff of the anterior division of the left MCA which is patent. There is a distal left M1/proximal left M2 occlusion near the base of the sylvian fissure (series 11, image 113). Attenuated flow distally within the left MCA branches. Right M1 segment widely patent without stenosis or occlusion. No proximal right M2 occlusion. Right MCA branches widely patent distally. Posterior circulation: Vertebral arteries patent to the vertebrobasilar junction. Left vertebral artery slightly dominant. Posterior inferior cerebral arteries patent bilaterally. Basilar artery widely patent. Superior cerebral arteries patent bilaterally. Left PCA supplied via the basilar and is widely patent to its distal aspect. Hypoplastic right P1 segment with a mildly prominent right  posterior communicating artery. Right PCA also supplied to its distal aspect. Venous sinuses: Patent. Anatomic variants: No significant anatomic variant. No aneurysm or vascular malformation. Delayed phase: Not performed. Review of the MIP images confirms the above findings IMPRESSION: 1. Positive CTA for emergent large vessel occlusion with distal left M1/ proximal left M2 occlusion as above. 2. Additional fairly mild atheromatous disease as above. No other significant flow-limiting stenosis or other abnormality identified. Critical Value/emergent results were called by telephone at the time of interpretation on 05/21/2016 at 6:34 pm to Dr. Melba Coon , who verbally acknowledged these results. Electronically Signed   By: Jeannine Boga M.D.   On: 05/21/2016 18:54   Ct Head Wo Contrast  Result Date: 05/22/2016 CLINICAL DATA:  24 hour post TPA, Code stroke yesterday with right side deficits. Patient intubated unable to obtain any history Neuro is wanting to extubate patient EXAM: CT HEAD WITHOUT CONTRAST TECHNIQUE: Contiguous axial images were obtained from the base of the skull through the vertex without intravenous contrast. COMPARISON:  05/21/2016 FINDINGS: Brain: No evidence of acute infarction, hemorrhage, hydrocephalus, extra-axial collection or mass lesion/mass effect. Old right cerebellar infarct. The Vascular: No hyperdense vessel or unexpected calcification. Skull: Normal. Negative for fracture or focal lesion. Sinuses/Orbits: No acute finding. Other: Endotracheal tube and orogastric tube are partially visualized. IMPRESSION: 1. Negative for bleed or other acute intracranial process. Electronically Signed   By: Lucrezia Europe M.D.   On: 05/22/2016 17:01   Ct Head Wo Contrast  Result Date: 05/22/2016 CLINICAL DATA:  Status post revascularization of the occluded LEFT M1 MCA achieving TICI3 reperfusion. EXAM: CT HEAD WITHOUT CONTRAST TECHNIQUE: Contiguous axial images were obtained from the base of  the skull through the vertex without intravenous contrast. COMPARISON:  CT head without contrast 05/21/2016. CTA head neck 05/21/2016. Cerebral angiogram 05/21/2016. FINDINGS: Brain: No evidence for acute infarction, hemorrhage, mass lesion, hydrocephalus, or extra-axial fluid. Normal cerebral volume. No significant white matter disease. No postprocedural complication such as hemorrhage is evident. No definite areas of LEFT hemisphere hypoattenuation are seen. Chronic RIGHT inferior cerebellar infarct is stable. Vascular: Contrast-enhanced blood pool from recent CTA and recent angiography. Proximal vessels appear patent, including LEFT MCA. Skull: Normal. Negative for fracture or focal lesion. Sinuses/Orbits: No acute finding. Other: None. IMPRESSION: Satisfactory appearance post thrombolysis of occluded LEFT M1 MCA. No developing acute infarction or postprocedure hemorrhage is evident. Electronically Signed   By: Staci Righter M.D.   On: 05/22/2016 08:07   Ct Angio Neck W Or Wo Contrast  Result Date: 05/21/2016 CLINICAL DATA:  Acute facial droop, right-sided deficits. Prior CT from earlier the same day. EXAM: CT ANGIOGRAPHY HEAD AND NECK TECHNIQUE: Multidetector CT imaging of the head and neck was performed using the standard protocol during bolus administration of intravenous contrast. Multiplanar CT image reconstructions and MIPs were obtained to evaluate the vascular anatomy. Carotid stenosis measurements (when applicable) are obtained utilizing NASCET criteria, using the distal internal carotid diameter as the denominator. CONTRAST:  50 cc of Isovue 370.  Initial evaluation for COMPARISON:  None. FINDINGS: CTA NECK FINDINGS Aortic arch: Visualized aortic arch of normal caliber with normal 3 vessel morphology. No high-grade stenosis seen about the origin of the great vessels. Visualized subclavian arteries widely patent. Right carotid system: Right common carotid artery patent from its origin to the  bifurcation. Mild a centric calcified plaque about the right bifurcation without significant stenosis. Right ICA patent distally to the skullbase without significant stenosis, dissection, or vascular occlusion. Left carotid system: Left common carotid artery patent from its origin to the bifurcation. No significant atheromatous narrowing about the left bifurcation. Left ICA patent from the bifurcation to the skullbase without stenosis, dissection, or occlusion. Vertebral arteries: Both of the vertebral arteries arise from the subclavian arteries. Vertebral arteries widely patent within the neck without stenosis, dissection, or occlusion. Skeleton: Visualized osseous structures within normal limits. No acute osseous abnormality. No worrisome lytic or blastic osseous lesions. Other neck: Visualized soft tissues of the neck demonstrate no acute abnormality. Thyroid normal. Salivary glands normal. No adenopathy. Upper chest: Visualized mediastinum within normal limits. Visualized lungs are clear. Mild emphysema noted. Review of the MIP images confirms the above findings CTA HEAD FINDINGS Anterior circulation: Petrous, cavernous, and supraclinoid segments of the internal carotid arteries are widely patent bilaterally without flow-limiting stenosis. Focal plaque noted at the supraclinoid right ICA with secondary mild stenosis. A1 segments patent. Right A1 segment mildly hypoplastic. Anterior communicating artery normal. Anterior cerebral arteries patent to their distal aspects. Proximal and mid left M1 segment widely patent. Proximal takeoff of the anterior division of the left MCA which is patent. There is a distal left M1/proximal left M2 occlusion near the base of the sylvian fissure (series 11, image 113). Attenuated flow distally within the left MCA branches. Right M1 segment widely patent without stenosis or occlusion. No proximal right M2 occlusion. Right MCA branches widely patent distally. Posterior circulation:  Vertebral arteries patent to the vertebrobasilar junction. Left vertebral artery slightly dominant. Posterior  inferior cerebral arteries patent bilaterally. Basilar artery widely patent. Superior cerebral arteries patent bilaterally. Left PCA supplied via the basilar and is widely patent to its distal aspect. Hypoplastic right P1 segment with a mildly prominent right posterior communicating artery. Right PCA also supplied to its distal aspect. Venous sinuses: Patent. Anatomic variants: No significant anatomic variant. No aneurysm or vascular malformation. Delayed phase: Not performed. Review of the MIP images confirms the above findings IMPRESSION: 1. Positive CTA for emergent large vessel occlusion with distal left M1/ proximal left M2 occlusion as above. 2. Additional fairly mild atheromatous disease as above. No other significant flow-limiting stenosis or other abnormality identified. Critical Value/emergent results were called by telephone at the time of interpretation on 05/21/2016 at 6:34 pm to Dr. Melba Coon , who verbally acknowledged these results. Electronically Signed   By: Jeannine Boga M.D.   On: 05/21/2016 18:54   Portable Chest Xray  Result Date: 05/22/2016 CLINICAL DATA:  Intubation . EXAM: PORTABLE CHEST 1 VIEW COMPARISON:  05/21/2016 . FINDINGS: Endotracheal tube and NG tube in stable position. Cardiac pacer with lead tips over the right atrium right ventricle. Prior CABG. Cardiomegaly with mild pulmonary venous congestion and bilateral mild interstitial prominence consistent mild CHF. Small left pleural effusion. Low lung volumes with basilar atelectasis. No pneumothorax . IMPRESSION: 1. Cardiac pacer with lead tips in right atrium right ventricle. Prior CABG. Cardiomegaly with mild pulmonary venous congestion and bilateral interstitial prominence. Small left pleural effusion. Findings consistent with mild CHF. 2. Low lung volumes with basilar atelectasis again noted. Electronically  Signed   By: Marcello Moores  Register   On: 05/22/2016 07:26   Portable Chest Xray  Result Date: 05/21/2016 CLINICAL DATA:  Respiratory failure EXAM: PORTABLE CHEST 1 VIEW COMPARISON:  09/05/2015 FINDINGS: Post sternotomy changes. There is a left-sided duo lead pacing device with leads projecting over the right atrium and right ventricle. Endotracheal tube tip partially obscured by overlying sternotomy, it is suspected to be approximately 3.8 cm superior to the carina. Esophageal tube tip is below the diaphragm but is not included. The right lung is grossly clear. There is dense left lower lobe consolidation and probable small effusion. There is mild cardiomegaly. IMPRESSION: 1. Dense left lower lobe consolidation may reflect atelectasis or a pneumonia. Suspect that there is a small left effusion 2. Mild cardiomegaly. Electronically Signed   By: Donavan Foil M.D.   On: 05/21/2016 21:56   Dg Abd Portable 1v  Result Date: 05/21/2016 CLINICAL DATA:  Orogastric tube placement EXAM: PORTABLE ABDOMEN - 1 VIEW COMPARISON:  None. FINDINGS: The tip and side port of the or gastric tube project over the gastric body. Nonobstructive bowel gas pattern. No osseous abnormalities. IMPRESSION: Orogastric tube tip and side port overlie the gastric body. Electronically Signed   By: Ulyses Jarred M.D.   On: 05/21/2016 22:06   Ct Head Code Stroke W/o Cm  Result Date: 05/21/2016 CLINICAL DATA:  Code stroke.  Right-sided deficits. EXAM: CT HEAD WITHOUT CONTRAST TECHNIQUE: Contiguous axial images were obtained from the base of the skull through the vertex without intravenous contrast. COMPARISON:  None. FINDINGS: Brain: No evidence of acute infarction, hemorrhage, hydrocephalus, extra-axial collection or mass lesion/mass effect. Right inferior cerebellar encephalomalacia probably representing sequelae of chronic infarction. Vascular: No hyperdense vessel or unexpected calcification. Skull: Normal. Negative for fracture or focal  lesion. Sinuses/Orbits: No acute finding. Other: None. ASPECTS [facility] Stroke Program Early CT Score) - Ganglionic level infarction (caudate, lentiform nuclei, internal capsule, insula, M1-M3 cortex): 7 -  Supraganglionic infarction (M4-M6 cortex): 3 Total score (0-10 with 10 being normal): 10 IMPRESSION: 1. No acute intracranial abnormality identified. 2. Right inferior cerebellar encephalomalacia, probably representing sequelae of chronic infarction. 3. ASPECTS is 10 These results were called by telephone at the time of interpretation on 05/21/2016 at 6:19 pm to Dr. Tyrone Nine who verbally acknowledged these results. Electronically Signed   By: Kristine Garbe M.D.   On: 05/21/2016 18:24    Labs:  CBC:  Recent Labs  04/15/16 0924 05/21/16 1804 05/21/16 1809 05/22/16 0408 05/23/16 0014  WBC 9.1 11.3*  --  8.3 14.8*  HGB 14.7 15.1 16.0 13.2 15.3  HCT 45.4 46.2 47.0 41.4 47.1  PLT 169 186  --  161 187    COAGS:  Recent Labs  09/05/15 1554 05/21/16 1804  INR 0.99 0.95  APTT  --  30    BMP:  Recent Labs  04/15/16 0924 05/21/16 1804 05/21/16 1809 05/22/16 0408 05/23/16 0014  NA 138 140 141 140 143  K 3.9 4.7 4.4 3.6 4.2  CL 106 106 103 106 109  CO2 23 27  --  25 24  GLUCOSE 100* 99 99 92 94  BUN 11 11 14 9 8   CALCIUM 9.2 9.5  --  9.1 9.3  CREATININE 1.22 1.37* 1.40* 1.01 1.17  GFRNONAA >60 >60  --  >60 >60  GFRAA >60 >60  --  >60 >60    LIVER FUNCTION TESTS:  Recent Labs  09/05/15 1554 03/08/16 2307 03/09/16 0300 05/21/16 1804  BILITOT 0.6 0.6 0.5 0.5  AST 53* 31 78* 21  ALT 37 67* 92* 19  ALKPHOS 59 70 78 57  PROT 6.3* 6.5 6.3* 7.2  ALBUMIN 3.1* 3.5 3.4* 3.9    Assessment and Plan:  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

## 2016-05-23 NOTE — Discharge Summary (Signed)
Stroke Discharge Summary  Patient ID: John Davenport   MRN: [id number]      DOB: [birth year]  Date of Admission: 05/21/2016 Date of Discharge: 05/23/2016  Attending Physician:  Garvin Fila, MD, Stroke MD Consultant(s):    Cardiology/CHF team and pulmonary/intensive care Patient's PCP:  No PCP Per Patient  DISCHARGE DIAGNOSIS:  Principal Problem:   Cerebrovascular accident (CVA) due to thrombosis of left middle cerebral artery (HCC) s/p IV tPA and mechanical thrombectomy Active Problems:   CAD in native artery   PAROXYSMAL VENTRICULAR TACHYCARDIA   OSA (obstructive sleep apnea)   Tobacco abuse   Hyperlipidemia   CKD (chronic kidney disease), stage III   Acute hypoxemic respiratory failure (HCC)   Chronic systolic congestive heart failure (HCC)   Hypotension   Smokeless tobacco use   Cocaine abuse  Past Medical History:  Diagnosis Date  . Anxiety state, unspecified   . Back pain   . Cancer ([place])    Lung CA  . Chronic systolic heart failure ([place])    a. Ischemic CM (EF 30% by echo in 2011) b. echo 08/2015: EF 20% with severe HK and AK of the inferoseptal and apical myocardium.  . Coronary artery disease    a. 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  b. eventually required single vessel CABG (L-LAD) in 03/2009 due to restenosis. c. cath 2013 showed patent LIMA sequential to the diags with LAD filling retrograde, no new disease in RCA and Cx, low LVEDP, sx felt noncardiac. d. 08/2015: NSTEMI: CTO of prox LAD, 99% stenosis of small dCx (no stent), + for cocaine  . Esophageal reflux   . Heart attack   . Hypercholesteremia   . ICD (implantable cardioverter-defibrillator) infection ([place])    a. [place] Sci ICD 12/2008.   . Ischemic cardiomyopathy    echocardiogram 12/11: Mild LVH, EF 30-35%, anteroseptal and apical akinesis, grade 1 diastolic dysfunction  . LBP (low back pain)   . OSA (obstructive sleep apnea) 12/30/2010  . Paroxysmal  ventricular tachycardia ([place])   . Tobacco abuse    Past Surgical History:  Procedure Laterality Date  . CARDIAC CATHETERIZATION N/A 09/06/2015   Procedure: Left Heart Cath and Cors/Grafts Angiography;  Surgeon: Sherren Mocha, MD;  Location: [facility];  Service: Cardiovascular;  Laterality: N/A;  . CARDIAC DEFIBRILLATOR PLACEMENT    . CARDIAC DEFIBRILLATOR PLACEMENT     2010 by JA  . CORONARY ANGIOPLASTY WITH STENT PLACEMENT     LAD stenting 10/09 and re-opening 6/10  . CORONARY ARTERY BYPASS GRAFT     1/11  . LEFT HEART CATHETERIZATION WITH CORONARY ANGIOGRAM N/A 08/07/2011   Procedure: LEFT HEART CATHETERIZATION WITH CORONARY ANGIOGRAM;  Surgeon: Josue Hector, MD;  Location: [facility] CATH LAB;  Service: Cardiovascular;  Laterality: N/A;  . RADIOLOGY WITH ANESTHESIA N/A 05/21/2016   Procedure: RADIOLOGY WITH ANESTHESIA;  Surgeon: Luanne Bras, MD;  Location: [facility];  Service: Radiology;  Laterality: N/A;    Allergies as of 05/23/2016   No Known Allergies     Medication List    STOP taking these medications   carvedilol 6.25 MG tablet Commonly known as:  COREG   furosemide 40 MG tablet Commonly known as:  LASIX   guaiFENesin 600 MG 12 hr tablet Commonly known as:  MUCINEX   isosorbide mononitrate 60 MG 24 hr tablet Commonly known as:  IMDUR   KLOR-CON M20 20 MEQ tablet  Generic drug:  potassium chloride SA   sacubitril-valsartan 49-51 MG Commonly known as:  ENTRESTO   spironolactone 25 MG tablet Commonly known as:  ALDACTONE   varenicline 1 MG tablet Commonly known as:  CHANTIX CONTINUING MONTH PAK     TAKE these medications   aspirin 81 MG EC tablet Take 1 tablet (81 mg total) by mouth daily.   atorvastatin 40 MG tablet Commonly known as:  LIPITOR Take 1 tablet (40 mg total) by mouth daily.   nicotine 14 mg/24hr patch Commonly known as:  NICODERM CQ - dosed in mg/24 hours Place 1 patch (14 mg total) onto the skin daily.   nitroGLYCERIN 0.4 MG SL  tablet Commonly known as:  NITROSTAT Place 1 tablet (0.4 mg total) under the tongue every 5 (five) minutes as needed for chest pain.   warfarin 7.5 MG tablet Commonly known as:  COUMADIN Take 1 tablet (7.5 mg total) by mouth daily at 6 PM.       LABORATORY STUDIES CBC    Component Value Date/Time   WBC 14.8 (H) 05/23/2016 0014   RBC 5.25 05/23/2016 0014   HGB 15.3 05/23/2016 0014   HCT 47.1 05/23/2016 0014   PLT 187 05/23/2016 0014   MCV 89.7 05/23/2016 0014   MCH 29.1 05/23/2016 0014   MCHC 32.5 05/23/2016 0014   RDW 13.8 05/23/2016 0014   LYMPHSABS 3.2 05/23/2016 0014   MONOABS 1.0 05/23/2016 0014   EOSABS 0.4 05/23/2016 0014   BASOSABS 0.0 05/23/2016 0014   CMP    Component Value Date/Time   NA 143 05/23/2016 0014   K 4.2 05/23/2016 0014   CL 109 05/23/2016 0014   CO2 24 05/23/2016 0014   GLUCOSE 94 05/23/2016 0014   BUN 8 05/23/2016 0014   CREATININE 1.17 05/23/2016 0014   CALCIUM 9.3 05/23/2016 0014   PROT 7.2 05/21/2016 1804   ALBUMIN 3.9 05/21/2016 1804   AST 21 05/21/2016 1804   ALT 19 05/21/2016 1804   ALKPHOS 57 05/21/2016 1804   BILITOT 0.5 05/21/2016 1804   GFRNONAA >60 05/23/2016 0014   GFRAA >60 05/23/2016 0014   COAGS Lab Results  Component Value Date   INR 0.95 05/21/2016   INR 0.99 09/05/2015   INR 1.0 08/05/2011   Lipid Panel    Component Value Date/Time   CHOL 130 05/22/2016 0408   TRIG 124 05/22/2016 0408   HDL 27 (L) 05/22/2016 0408   CHOLHDL 4.8 05/22/2016 0408   VLDL 25 05/22/2016 0408   LDLCALC 78 05/22/2016 0408   HgbA1C  Lab Results  Component Value Date   HGBA1C 5.4 05/22/2016   Urinalysis    Component Value Date/Time   COLORURINE YELLOW 05/21/2016 1853   APPEARANCEUR CLEAR 05/21/2016 1853   LABSPEC 1.026 05/21/2016 1853   PHURINE 5.0 05/21/2016 1853   GLUCOSEU NEGATIVE 05/21/2016 1853   HGBUR NEGATIVE 05/21/2016 1853   BILIRUBINUR NEGATIVE 05/21/2016 1853   KETONESUR NEGATIVE 05/21/2016 1853   PROTEINUR  NEGATIVE 05/21/2016 1853   UROBILINOGEN 0.2 04/09/2009 1132   NITRITE NEGATIVE 05/21/2016 1853   LEUKOCYTESUR NEGATIVE 05/21/2016 1853   Urine Drug Screen     Component Value Date/Time   LABOPIA POSITIVE (A) 05/21/2016 2138   COCAINSCRNUR POSITIVE (A) 05/21/2016 2138   COCAINSCRNUR (A) 01/30/2008 0640    POSITIVE (NOTE) Result repeated and verified. Sent for confirmatory testing   LABBENZ NONE DETECTED 05/21/2016 2138   LABBENZ (A) 01/30/2008 0640    POSITIVE (NOTE) Result repeated and verified. Sent  for confirmatory testing   AMPHETMU NONE DETECTED 05/21/2016 2138   THCU NONE DETECTED 05/21/2016 2138   LABBARB NONE DETECTED 05/21/2016 2138    SIGNIFICANT DIAGNOSTIC STUDIES Ct Head Code Stroke W/o Cm 05/21/2016  1. No acute intracranial abnormality identified. 2. Right inferior cerebellar encephalomalacia, probably representing sequelae of chronic infarction. 3. ASPECTS is 10   Ct Angio Head W Or Wo Contrast Ct Angio Neck W Or Wo Contrast 05/21/2016 1. Positive CTA for emergent large vessel occlusion with distal left M1/ proximal left M2 occlusion as above. 2. Additional fairly mild atheromatous disease as above. No other significant flow-limiting stenosis or other abnormality identified.   Cerebral angiogram  05/21/2016  S/P Lt common carotid arteriogram,followed by complete revascularization  of occluded Lt MCA M1 segment with x 1 pass with 40m x 40 mm solitaire FR retrieval device achieving a TICI 3 reperfusion.  Ct Head Wo Contrast 05/22/2016 1. Negative for bleed or other acute intracranial process.  05/22/2016 Satisfactory appearance post thrombolysis of occluded LEFT M1 MCA. No developing acute infarction or postprocedure hemorrhage is evident.   Portable Chest Xray 05/22/2016 1. Cardiac pacer with lead tips in right atrium right ventricle. Prior CABG. Cardiomegaly with mild pulmonary venous congestion and bilateral interstitial prominence. Small left pleural effusion.  Findings consistent with mild CHF. 2. Low lung volumes with basilar atelectasis again noted.  05/21/2016 1. Dense left lower lobe consolidation may reflect atelectasis or a pneumonia. Suspect that there is a small left effusion 2. Mild cardiomegaly.   2-D echocardiogram - Left ventricle: The cavity size was severely dilated. Posterior wall thickness was mildly increased. Systolic function was severely reduced. The estimated ejection fraction was in the range of 15% to 20%. Diffuse hypokinesis. Akinesis of the anteroseptal, inferoseptal and mild to apical anterior myocardium. Doppler parameters are consistent with a reversible restrictive pattern, indicative of decreased left ventricular diastolic compliance and/or increased left atrial pressure (grade 3 diastolic dysfunction). Doppler parameters are consistent with high ventricular filling pressure. - Aortic valve: Transvalvular velocity was within the normal range. There was no stenosis. There was trivial regurgitation. Mitral valve: Transvalvular velocity was within the normal range. There was no evidence for stenosis. There was mild regurgitation. - Right ventricle: The cavity size was normal. Wall thickness was normal. Systolic function was mildly reduced. - Atrial septum: No defect or patent foramen ovale was identified by color flow Doppler. - Tricuspid valve: There was moderate regurgitation. - Pulmonary arteries: PA peak pressure: 45 mm Hg (S).     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 Today 05/21/2016 he was in his usual state of health until about 1700 (LKW) when he developed abrupt onset of right-sided weakness and difficulty speaking. His friend called 911 and EMS arrived to find the patient with expressive aphasia and dense weakness of the R arm and face with lesser involvement of the R leg. Symptoms improved during transport to the  ED and he was able to move his R side and speak by the time they arrived at hospital. Dr. OShon Halemet the patient on his arrival to the ED with the rest of the stroke team. He had a mild R facial droop, some occasional word-finding difficulties, and mildly slurred speech for an NIHSS score of 3. Of note, the patient sat up on the gurney while the RN was putting him into a gown and he was observed  to have clear worsening of both his aphasia and his facial droop. NHISS score: 3. CTH showed no acute abnormality. CTA of the head showed proximal occlusion of the L M2 segment. The case was reviewed with Dr. Estanislado Pandy, neurointerventional radiology, and it was felt that the patient would likely benefit from thrombectomy. After speaking with the patient and his mother (who is his POA), the decision was made to proceed with IV tPA followed by thrombectomy in IR. IV tPA was started at 1850, with 8 mg bolus followed by 79 mg infusion. He was taken to IR where Lt common carotid arteriogram confirmed L M1 occlusion. He received TICI 3 revascularization with x 1 pass of the solitaire device.   HOSPITAL COURSE Mr. PARV MANTHEY is a 41 y.o. male with history of multiple MIs and ischemic cardiomyopathy with EF 20% presenting with R hemiparesis, expressive aphasia, dysarthria. He received IV t-PA 05/21/2016 at 1850. CTA showed ELVO LM1/M2. He was taken to IR where Lt common carotid arteriogram confirmed L M1 occlusion and he received TICI 3 revascularization with x 1 pass of the solitaire.  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 S/p IV tPA and mechanical thrombectomy with TICI 3 revascularization  Resultant right hemiparesis  Code Stroke CT no acute abnormality. R inferior cerebellar encephalomalacia. Aspects 10.  CTA head and neck ELVO distal L M1/proximal L M2  Cerebral angio L M1 occlusion s/p TICI 3 revascularization with solitaire  Post IR CT satisfactory appearance post IR. No  hemorrhage.  24h CT no acute infarct or bleed   2D Echo  EF 15-20% LV severely dilated.   LDL 78  HgbA1c 5.4  aspirin 81 mg daily prior to admission, received 2 doses aspirin 81 mg within 24 post tpa window. No post tPA hemorrhage/CT imaging negative for hemorrhage. Aspirin resumed. Discharge on coumadin based on cardiology recommendations  Therapy recommendations:  no therapy needs  Disposition:  return home  Acute respiratory Failure, resolved  Intubated for neuro intervention  Hypotension  Ran low during hospitalization. Treated with neo. Home meds on hold. Will not resume at discharge. To follow up at CHF clinic              Hyperlipidemia  Home meds:  lipitor 40, resumed in hospital  LDL just above goal  Continue statin at discharge  Other Stroke Risk Factors  Cigarette smoker, advised to stop smoking. Nicotine patch ordered  Smokeless tobacco user, advised to stop using  UDS positive for opiates and cocaine, advised to stop using. He denied use.  Obstructive sleep apnea, ? CPAP at home  Cardiac disease  Coronary artery disease - s/p CABG, stent  Chronic systolic CHF - EF 43-32%  Hx PVT defrillator placement  cardiology consulted. Coumadin recommended at discharge, no bridge  Will follow up with coumadin clinic as well as CHF clinic  Other Active Problems  AKI, with hx CKD stage III  DISCHARGE EXAM Blood pressure (!) 89/58, pulse 74, temperature 97.5 F (36.4 C), temperature source Oral, resp. rate 17, height _0  (1.88 m), weight 97 kg (213 lb 13.5 oz), SpO2 100 %. Middle aged frail  [place]ucas[place] male  . Marland Kitchen Afebrile. Head is nontraumatic. Neck is supple without bruit.    Cardiac exam Soft ejection murmur no gallop. Lungs are clear to auscultation. Distal pulses are well felt. Neurological Exam:  Patient is awake alert and interactive. No aphasia or apraxia dysarthria.. Pupils 3 mm equal reactive. Extraocular movements  are full range without  nystagmus. Blinks to threat bilaterally. Corneals present bilaterally. Fundi could not be visualized. Moves all 4 extremities equally well against gravity. No focal weakness. Deep tendon reflexes are 1+ symmetric. Plantars are downgoing. NIH  stroke scale 0  Discharge Diet   Diet Heart Room service appropriate? Yes; Fluid consistency: Thin liquids  DISCHARGE PLAN  Disposition:  Return home  aspirin 81 mg daily and warfarin daily for secondary stroke prevention.  Ongoing risk factor control by Primary Care Physician at time of discharge  Follow-up Dr. Estanislado Pandy in 2 weeks.  Follow-up Dr. Aundra Dubin 06/03/2016 at 3p  Follow up Coumadin Clinic 05/28/2016 at [facility] with Dr. Antony Contras, Stroke Clinic in 6 weeks, office to schedule an appointment.  45 minutes were spent preparing discharge.  [place] for Pager information 05/23/2016 12:14 PM  I have personally examined this patient, reviewed notes, independently viewed imaging studies, participated in medical decision making and plan of care.ROS completed by me personally and pertinent positives fully documented  I have made any additions or clarifications directly to the above note. Agree with note above.    Antony Contras, MD Medical Director [facility] Pager: [phone] 05/23/2016 12:35 PM

## 2016-05-23 NOTE — Care Management Note (Signed)
Case Management Note  Patient Details  Name: John Davenport MRN: [id number] Date of Birth: Dec 24, 1975  Subjective/Objective:  Pt medically stable for dc home today with mother.                    Action/Plan: PT/OT recommending no OP follow up.  No discharge needs identified.    Expected Discharge Date:  05/23/16               Expected Discharge Plan:  Home/Self Care  In-House Referral:     Discharge planning Services  CM Consult  Post Acute Care Choice:    Choice offered to:     DME Arranged:    DME Agency:     HH Arranged:    HH Agency:     Status of Service:  Completed, signed off  If discussed at [employer] of Stay Meetings, dates discussed:    Additional Comments:  Reinaldo Raddle, RN, BSN  Trauma/Neuro ICU Case Manager [phone]

## 2016-05-23 NOTE — Progress Notes (Signed)
ANTICOAGULATION CONSULT NOTE - Initial Consult  Pharmacy Consult for warfarin  Indication: stroke  No Known Allergies  Patient Measurements: Height: 6\' 2"  (188 cm) Weight: 213 lb 13.5 oz (97 kg) IBW/kg (Calculated) : 82.2   Vital Signs: Temp: 97.9 F (36.6 C) (02/23 0800) Temp Source: Oral (02/23 0800) BP: 109/78 (02/23 1000) Pulse Rate: 63 (02/23 1000)  Labs:  Recent Labs  05/21/16 1804 05/21/16 1809 05/22/16 0408 05/23/16 0014  HGB 15.1 16.0 13.2 15.3  HCT 46.2 47.0 41.4 47.1  PLT 186  --  161 187  APTT 30  --   --   --   LABPROT 12.6  --   --   --   INR 0.95  --   --   --   CREATININE 1.37* 1.40* 1.01 1.17    Estimated Creatinine Clearance: 95.6 mL/min (by C-G formula based on SCr of 1.17 mg/dL).   Medical History: Past Medical History:  Diagnosis Date  . Anxiety state, unspecified   . Back pain   . Cancer ([place])    Lung CA  . Chronic systolic heart failure ([place])    a. Ischemic CM (EF 30% by echo in 2011) b. echo 08/2015: EF 20% with severe HK and AK of the inferoseptal and apical myocardium.  . Coronary artery disease    a. 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  b. eventually required single vessel CABG (L-LAD) in 03/2009 due to restenosis. c. cath 2013 showed patent LIMA sequential to the diags with LAD filling retrograde, no new disease in RCA and Cx, low LVEDP, sx felt noncardiac. d. 08/2015: NSTEMI: CTO of prox LAD, 99% stenosis of small dCx (no stent), + for cocaine  . Esophageal reflux   . Heart attack   . Hypercholesteremia   . ICD (implantable cardioverter-defibrillator) infection ([place])    a. [place] Sci ICD 12/2008.   . Ischemic cardiomyopathy    echocardiogram 12/11: Mild LVH, EF 30-35%, anteroseptal and apical akinesis, grade 1 diastolic dysfunction  . LBP (low back pain)   . OSA (obstructive sleep apnea) 12/30/2010  . Paroxysmal ventricular tachycardia ([place])   . Tobacco abuse    Assessment: 61 yom  admitted with stroke, s/p tpa and clot retrieval. No history of afib but significant cardiac history with very weak heart.    Baseline INR 0.9. No bleeding complications noted post -tpa.   Goal of Therapy:  INR 2-3 Monitor platelets by anticoagulation protocol: Yes   Plan:  Warfarin 7.5mg  daily until seen as outpatient Daily INR while admitted, cardiology setting up outpatient appt for early next wk at coumadin clinic  Erin Hearing PharmD., BCPS Clinical Pharmacist Pager [phone] 05/23/2016 11:58 AM

## 2016-05-23 NOTE — Consult Note (Addendum)
Advanced Heart Failure Team Consult Note  Referring Physician: Dr Leonie Man Primary Physician: None  Primary Cardiologist:  Dr Aundra Dubin   Reason for Consultation: HF and anticoagulation recommedations.   HPI:   [name] is a 41 year old  with a history of CAD s/p CABG, chronic systolic CHF (ischemic cardiomyopathy), and COPD. He had his first MI with occlusion of LAD in 12/2007 treated with BMS.  He then had POBA of reocclusion of LAD stent in 08/2008. Finally, he had sequential LIMA-D1 and D2 in 1/11.  NSTEMI in 08/2015 (cocaine positive) showed 99% stenosis small distal LCx as likely culprit. This was medically managed. Echo in 6/17 showed EF 20% with wall motion abnormalities.    Followed closely in the HF clinic. He was last seen January 16th. At that time volume status was low so lasix was held 2 days and entresto was cut back to 49-51 mg twice a day. SBP in the outpatient setting < 100.   Admitted with expressive aphasia and weakness on the R.  UDS + cocaine. CTA of the head showed proximal occlusion of the L M2 segment. He received TPA followed by thrombectomy. F/U CT with no evidence of stroke or hemorrhage. BP meds stopped due to hypotension.  ECHO completed and showed severely reduced EF 15-20% with no clot identified.   Denies SOB/CP. Anxious to go home.   Review of Systems: [y] = yes, [ ]  = no   General: Weight gain [ ] ; Weight loss [ ] ; Anorexia [ ] ; Fatigue [Y ]; Fever [ ] ; Chills [ ] ; Weakness [ ]   Cardiac: Chest pain/pressure [ ] ; Resting SOB [ ] ; Exertional SOB [ ] ; Orthopnea [ ] ; Pedal Edema [ ] ; Palpitations [ ] ; Syncope [ ] ; Presyncope [ ] ; Paroxysmal nocturnal dyspnea[ ]   Pulmonary: Cough [ ] ; Wheezing[ ] ; Hemoptysis[ ] ; Sputum [ ] ; Snoring [ ]   GI: Vomiting[ ] ; Dysphagia[ ] ; Melena[ ] ; Hematochezia [ ] ; Heartburn[ ] ; Abdominal pain [ ] ; Constipation [ ] ; Diarrhea [ ] ; BRBPR [ ]   GU: Hematuria[ ] ; Dysuria [ ] ; Nocturia[ ]   Vascular: Pain in legs with walking [ ] ; Pain in  feet with lying flat [ ] ; Non-healing sores [ ] ; Stroke [ Y]; TIA [ ] ; Slurred speech [ ] ;  Neuro: Headaches[ ] ; Vertigo[ ] ; Seizures[ ] ; Paresthesias[ ] ;Blurred vision [ ] ; Diplopia [ ] ; Vision changes [ ]   Ortho/Skin: Arthritis [ ] ; Joint pain [ ] ; Muscle pain [ ] ; Joint swelling [ ] ; Back Pain [ ] ; Rash [ ]   Psych: Depression[ ] ; Anxiety[Y ]  Heme: Bleeding problems [ ] ; Clotting disorders [ ] ; Anemia [ ]   Endocrine: Diabetes [ ] ; Thyroid dysfunction[ ]   Home Medications Prior to Admission medications   Medication Sig Start Date End Date Taking? Authorizing Provider  aspirin EC 81 MG EC tablet Take 1 tablet (81 mg total) by mouth daily. 09/07/15  Yes Erma Heritage, PA  atorvastatin (LIPITOR) 40 MG tablet Take 1 tablet (40 mg total) by mouth daily. 03/11/16  Yes Larey Dresser, MD  carvedilol (COREG) 6.25 MG tablet Take 1.5 tablets (9.375 mg total) by mouth 2 (two) times daily. 04/01/16  Yes Amy D Clegg, NP  furosemide (LASIX) 40 MG tablet Take 40 mg by mouth daily.    Yes Historical Provider, MD  isosorbide mononitrate (IMDUR) 60 MG 24 hr tablet Take 1 tablet (60 mg total) by mouth daily. 03/11/16 03/11/17 Yes Larey Dresser, MD  KLOR-CON M20 20 MEQ tablet Take  20 mEq by mouth daily. 04/15/16  Yes Historical Provider, MD  spironolactone (ALDACTONE) 25 MG tablet Take 0.5 tablets (12.5 mg total) by mouth daily. 03/11/16 06/09/16 Yes Larey Dresser, MD  guaiFENesin (MUCINEX) 600 MG 12 hr tablet Take 1 tablet (600 mg total) by mouth 2 (two) times daily. Patient not taking: Reported on 05/22/2016 04/15/16   Maryellen Pile, MD  nitroGLYCERIN (NITROSTAT) 0.4 MG SL tablet Place 1 tablet (0.4 mg total) under the tongue every 5 (five) minutes as needed for chest pain. 09/24/15   Brittainy M Simmons, PA-C  sacubitril-valsartan (ENTRESTO) 49-51 MG Take 1 tablet by mouth 2 (two) times daily. Patient not taking: Reported on 05/22/2016 04/15/16   Amy D Ninfa Meeker, NP  varenicline (CHANTIX CONTINUING MONTH PAK) 1  MG tablet Take 1 tablet (1 mg total) by mouth 2 (two) times daily. Patient not taking: Reported on 05/22/2016 03/17/16   Larey Dresser, MD    Past Medical History: Past Medical History:  Diagnosis Date  . Anxiety state, unspecified   . Back pain   . Cancer ([place])    Lung CA  . Chronic systolic heart failure ([place])    a. Ischemic CM (EF 30% by echo in 2011) b. echo 08/2015: EF 20% with severe HK and AK of the inferoseptal and apical myocardium.  . Coronary artery disease    a. 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  b. eventually required single vessel CABG (L-LAD) in 03/2009 due to restenosis. c. cath 2013 showed patent LIMA sequential to the diags with LAD filling retrograde, no new disease in RCA and Cx, low LVEDP, sx felt noncardiac. d. 08/2015: NSTEMI: CTO of prox LAD, 99% stenosis of small dCx (no stent), + for cocaine  . Esophageal reflux   . Heart attack   . Hypercholesteremia   . ICD (implantable cardioverter-defibrillator) infection ([place])    a. [place] [employer] ICD 12/2008.   . Ischemic cardiomyopathy    echocardiogram 12/11: Mild LVH, EF 30-35%, anteroseptal and apical akinesis, grade 1 diastolic dysfunction  . LBP (low back pain)   . OSA (obstructive sleep apnea) 12/30/2010  . Paroxysmal ventricular tachycardia ([place])   . Tobacco abuse     Past Surgical History: Past Surgical History:  Procedure Laterality Date  . CARDIAC CATHETERIZATION N/A 09/06/2015   Procedure: Left Heart Cath and Cors/Grafts Angiography;  Surgeon: Sherren Mocha, MD;  Location: [facility];  Service: Cardiovascular;  Laterality: N/A;  . CARDIAC DEFIBRILLATOR PLACEMENT    . CARDIAC DEFIBRILLATOR PLACEMENT     2010 by JA  . CORONARY ANGIOPLASTY WITH STENT PLACEMENT     LAD stenting 10/09 and re-opening 6/10  . CORONARY ARTERY BYPASS GRAFT     1/11  . LEFT HEART CATHETERIZATION WITH CORONARY ANGIOGRAM N/A 08/07/2011   Procedure: LEFT HEART CATHETERIZATION WITH  CORONARY ANGIOGRAM;  Surgeon: Josue Hector, MD;  Location: [facility] CATH LAB;  Service: Cardiovascular;  Laterality: N/A;  . RADIOLOGY WITH ANESTHESIA N/A 05/21/2016   Procedure: RADIOLOGY WITH ANESTHESIA;  Surgeon: Luanne Bras, MD;  Location: [facility];  Service: Radiology;  Laterality: N/A;    Family History: Family History  Problem Relation Age of Onset  . Heart attack Father 72    died at 76 after multiple MI's  . COPD Maternal Grandmother     Social History: Social History   Social History  . Marital status: Single    Spouse name: N/A  . Number  of children: 1  . Years of education: N/A   Occupational History  . disabled Unemployed   Social History Main Topics  . Smoking status: Current Every Day Smoker    Packs/day: 0.30    Years: 20.00    Types: Cigarettes  . Smokeless tobacco: Former Systems developer    Types: Chew  . Alcohol use No  . Drug use: No     Comment: REMOTE H/O SUBSTANCE ABUSE  . Sexual activity: Not Asked   Other Topics Concern  . None   Social History Narrative   Pt lives with his mom, brother, and 73 yo son in [place].   Quit tobacco in Jan. 2011 prior to heart surgery (prior -1/2 PPD (down from 1 ppd per day, onset age 89)    alcohol abuse- prior, hasn't drank since age 65   no illicit drug use    Currently unemployed, awaiting disability   Single          Allergies:  No Known Allergies  Objective:    Vital Signs:   Temp:  [97.5 F (36.4 C)-98.3 F (36.8 C)] 97.9 F (36.6 C) (02/23 0800) Pulse Rate:  [52-84] 63 (02/23 1000) Resp:  [10-28] 20 (02/23 1000) BP: (102-139)/(67-114) 109/78 (02/23 1000) SpO2:  [91 %-100 %] 99 % (02/23 1000) FiO2 (%):  [40 %] 40 % (02/22 1534) Last BM Date:  (pta)  Weight change: Filed Weights   05/21/16 1823  Weight: 213 lb 13.5 oz (97 kg)    Intake/Output:   Intake/Output Summary (Last 24 hours) at 05/23/16 1119 Last data filed at 05/23/16 1000  Gross per 24 hour  Intake          2704.95 ml   Output             1525 ml  Net          1179.95 ml     Physical Exam: General:  Well appearing. No resp difficulty. Mom and Aunt in the room  HEENT: normal. Poor dentition Neck: supple. JVP 5-6. Carotids 2+ bilat; no bruits. No lymphadenopathy or thryomegaly appreciated. Cor: PMI laterally displaced. Regular rate & rhythm. No rubs, gallops or murmurs. Lungs: clear Abdomen: soft, nontender, nondistended. No hepatosplenomegaly. No bruits or masses. Good bowel sounds. Extremities: no cyanosis, clubbing, rash, edema Neuro: alert & orientedx3, cranial nerves grossly intact. moves all 4 extremities w/o difficulty. Affect pleasant  Telemetry: NSR 90s No AF Personally reviewed   Labs: Basic Metabolic Panel:  Recent Labs Lab 05/21/16 1804 05/21/16 1809 05/22/16 0408 05/23/16 0014  NA 140 141 140 143  K 4.7 4.4 3.6 4.2  CL 106 103 106 109  CO2 27  --  25 24  GLUCOSE 99 99 92 94  BUN 11 14 9 8   CREATININE 1.37* 1.40* 1.01 1.17  CALCIUM 9.5  --  9.1 9.3  MG  --   --   --  2.0  PHOS  --   --   --  3.5    Liver Function Tests:  Recent Labs Lab 05/21/16 1804  AST 21  ALT 19  ALKPHOS 57  BILITOT 0.5  PROT 7.2  ALBUMIN 3.9   No results for input(s): LIPASE, AMYLASE in the last 168 hours. No results for input(s): AMMONIA in the last 168 hours.  CBC:  Recent Labs Lab 05/21/16 1804 05/21/16 1809 05/22/16 0408 05/23/16 0014  WBC 11.3*  --  8.3 14.8*  NEUTROABS 7.4  --  4.1 10.0*  HGB 15.1  16.0 13.2 15.3  HCT 46.2 47.0 41.4 47.1  MCV 90.1  --  89.8 89.7  PLT 186  --  161 187    Cardiac Enzymes: No results for input(s): CKTOTAL, CKMB, CKMBINDEX, TROPONINI in the last 168 hours.  BNP: BNP (last 3 results)  Recent Labs  01/15/16 1222 02/04/16 1234 04/15/16 0924  BNP 474.6* 110.6* 218.2*    ProBNP (last 3 results) No results for input(s): PROBNP in the last 8760 hours.   CBG:  Recent Labs Lab 05/23/16 0455  GLUCAP 97    Coagulation  Studies:  Recent Labs  05/21/16 1804  LABPROT 12.6  INR 0.95    Other results: EKG: NSR 72  Imaging: Ct Angio Head W Or Wo Contrast  Result Date: 05/21/2016 CLINICAL DATA:  Acute facial droop, right-sided deficits. Prior CT from earlier the same day. EXAM: CT ANGIOGRAPHY HEAD AND NECK TECHNIQUE: Multidetector CT imaging of the head and neck was performed using the standard protocol during bolus administration of intravenous contrast. Multiplanar CT image reconstructions and MIPs were obtained to evaluate the vascular anatomy. Carotid stenosis measurements (when applicable) are obtained utilizing NASCET criteria, using the distal internal carotid diameter as the denominator. CONTRAST:  50 cc of Isovue 370.  Initial evaluation for COMPARISON:  None. FINDINGS: CTA NECK FINDINGS Aortic arch: Visualized aortic arch of normal caliber with normal 3 vessel morphology. No high-grade stenosis seen about the origin of the great vessels. Visualized subclavian arteries widely patent. Right carotid system: Right common carotid artery patent from its origin to the bifurcation. Mild a centric calcified plaque about the right bifurcation without significant stenosis. Right ICA patent distally to the skullbase without significant stenosis, dissection, or vascular occlusion. Left carotid system: Left common carotid artery patent from its origin to the bifurcation. No significant atheromatous narrowing about the left bifurcation. Left ICA patent from the bifurcation to the skullbase without stenosis, dissection, or occlusion. Vertebral arteries: Both of the vertebral arteries arise from the subclavian arteries. Vertebral arteries widely patent within the neck without stenosis, dissection, or occlusion. Skeleton: Visualized osseous structures within normal limits. No acute osseous abnormality. No worrisome lytic or blastic osseous lesions. Other neck: Visualized soft tissues of the neck demonstrate no acute abnormality.  Thyroid normal. Salivary glands normal. No adenopathy. Upper chest: Visualized mediastinum within normal limits. Visualized lungs are clear. Mild emphysema noted. Review of the MIP images confirms the above findings CTA HEAD FINDINGS Anterior circulation: Petrous, cavernous, and supraclinoid segments of the internal carotid arteries are widely patent bilaterally without flow-limiting stenosis. Focal plaque noted at the supraclinoid right ICA with secondary mild stenosis. A1 segments patent. Right A1 segment mildly hypoplastic. Anterior communicating artery normal. Anterior cerebral arteries patent to their distal aspects. Proximal and mid left M1 segment widely patent. Proximal takeoff of the anterior division of the left MCA which is patent. There is a distal left M1/proximal left M2 occlusion near the base of the sylvian fissure (series 11, image 113). Attenuated flow distally within the left MCA branches. Right M1 segment widely patent without stenosis or occlusion. No proximal right M2 occlusion. Right MCA branches widely patent distally. Posterior circulation: Vertebral arteries patent to the vertebrobasilar junction. Left vertebral artery slightly dominant. Posterior inferior cerebral arteries patent bilaterally. Basilar artery widely patent. Superior cerebral arteries patent bilaterally. Left PCA supplied via the basilar and is widely patent to its distal aspect. Hypoplastic right P1 segment with a mildly prominent right posterior communicating artery. Right PCA also supplied to its distal aspect.  Venous sinuses: Patent. Anatomic variants: No significant anatomic variant. No aneurysm or vascular malformation. Delayed phase: Not performed. Review of the MIP images confirms the above findings IMPRESSION: 1. Positive CTA for emergent large vessel occlusion with distal left M1/ proximal left M2 occlusion as above. 2. Additional fairly mild atheromatous disease as above. No other significant flow-limiting stenosis  or other abnormality identified. Critical Value/emergent results were called by telephone at the time of interpretation on 05/21/2016 at 6:34 pm to Dr. Melba Coon , who verbally acknowledged these results. Electronically Signed   By: Jeannine Boga M.D.   On: 05/21/2016 18:54   Ct Head Wo Contrast  Result Date: 05/22/2016 CLINICAL DATA:  24 hour post TPA, Code stroke yesterday with right side deficits. Patient intubated unable to obtain any history Neuro is wanting to extubate patient EXAM: CT HEAD WITHOUT CONTRAST TECHNIQUE: Contiguous axial images were obtained from the base of the skull through the vertex without intravenous contrast. COMPARISON:  05/21/2016 FINDINGS: Brain: No evidence of acute infarction, hemorrhage, hydrocephalus, extra-axial collection or mass lesion/mass effect. Old right cerebellar infarct. The Vascular: No hyperdense vessel or unexpected calcification. Skull: Normal. Negative for fracture or focal lesion. Sinuses/Orbits: No acute finding. Other: Endotracheal tube and orogastric tube are partially visualized. IMPRESSION: 1. Negative for bleed or other acute intracranial process. Electronically Signed   By: Lucrezia Europe M.D.   On: 05/22/2016 17:01   Ct Head Wo Contrast  Result Date: 05/22/2016 CLINICAL DATA:  Status post revascularization of the occluded LEFT M1 MCA achieving TICI3 reperfusion. EXAM: CT HEAD WITHOUT CONTRAST TECHNIQUE: Contiguous axial images were obtained from the base of the skull through the vertex without intravenous contrast. COMPARISON:  CT head without contrast 05/21/2016. CTA head neck 05/21/2016. Cerebral angiogram 05/21/2016. FINDINGS: Brain: No evidence for acute infarction, hemorrhage, mass lesion, hydrocephalus, or extra-axial fluid. Normal cerebral volume. No significant white matter disease. No postprocedural complication such as hemorrhage is evident. No definite areas of LEFT hemisphere hypoattenuation are seen. Chronic RIGHT inferior  cerebellar infarct is stable. Vascular: Contrast-enhanced blood pool from recent CTA and recent angiography. Proximal vessels appear patent, including LEFT MCA. Skull: Normal. Negative for fracture or focal lesion. Sinuses/Orbits: No acute finding. Other: None. IMPRESSION: Satisfactory appearance post thrombolysis of occluded LEFT M1 MCA. No developing acute infarction or postprocedure hemorrhage is evident. Electronically Signed   By: Staci Righter M.D.   On: 05/22/2016 08:07   Ct Angio Neck W Or Wo Contrast  Result Date: 05/21/2016 CLINICAL DATA:  Acute facial droop, right-sided deficits. Prior CT from earlier the same day. EXAM: CT ANGIOGRAPHY HEAD AND NECK TECHNIQUE: Multidetector CT imaging of the head and neck was performed using the standard protocol during bolus administration of intravenous contrast. Multiplanar CT image reconstructions and MIPs were obtained to evaluate the vascular anatomy. Carotid stenosis measurements (when applicable) are obtained utilizing NASCET criteria, using the distal internal carotid diameter as the denominator. CONTRAST:  50 cc of Isovue 370.  Initial evaluation for COMPARISON:  None. FINDINGS: CTA NECK FINDINGS Aortic arch: Visualized aortic arch of normal caliber with normal 3 vessel morphology. No high-grade stenosis seen about the origin of the great vessels. Visualized subclavian arteries widely patent. Right carotid system: Right common carotid artery patent from its origin to the bifurcation. Mild a centric calcified plaque about the right bifurcation without significant stenosis. Right ICA patent distally to the skullbase without significant stenosis, dissection, or vascular occlusion. Left carotid system: Left common carotid artery patent from its origin to the  bifurcation. No significant atheromatous narrowing about the left bifurcation. Left ICA patent from the bifurcation to the skullbase without stenosis, dissection, or occlusion. Vertebral arteries: Both of  the vertebral arteries arise from the subclavian arteries. Vertebral arteries widely patent within the neck without stenosis, dissection, or occlusion. Skeleton: Visualized osseous structures within normal limits. No acute osseous abnormality. No worrisome lytic or blastic osseous lesions. Other neck: Visualized soft tissues of the neck demonstrate no acute abnormality. Thyroid normal. Salivary glands normal. No adenopathy. Upper chest: Visualized mediastinum within normal limits. Visualized lungs are clear. Mild emphysema noted. Review of the MIP images confirms the above findings CTA HEAD FINDINGS Anterior circulation: Petrous, cavernous, and supraclinoid segments of the internal carotid arteries are widely patent bilaterally without flow-limiting stenosis. Focal plaque noted at the supraclinoid right ICA with secondary mild stenosis. A1 segments patent. Right A1 segment mildly hypoplastic. Anterior communicating artery normal. Anterior cerebral arteries patent to their distal aspects. Proximal and mid left M1 segment widely patent. Proximal takeoff of the anterior division of the left MCA which is patent. There is a distal left M1/proximal left M2 occlusion near the base of the sylvian fissure (series 11, image 113). Attenuated flow distally within the left MCA branches. Right M1 segment widely patent without stenosis or occlusion. No proximal right M2 occlusion. Right MCA branches widely patent distally. Posterior circulation: Vertebral arteries patent to the vertebrobasilar junction. Left vertebral artery slightly dominant. Posterior inferior cerebral arteries patent bilaterally. Basilar artery widely patent. Superior cerebral arteries patent bilaterally. Left PCA supplied via the basilar and is widely patent to its distal aspect. Hypoplastic right P1 segment with a mildly prominent right posterior communicating artery. Right PCA also supplied to its distal aspect. Venous sinuses: Patent. Anatomic variants: No  significant anatomic variant. No aneurysm or vascular malformation. Delayed phase: Not performed. Review of the MIP images confirms the above findings IMPRESSION: 1. Positive CTA for emergent large vessel occlusion with distal left M1/ proximal left M2 occlusion as above. 2. Additional fairly mild atheromatous disease as above. No other significant flow-limiting stenosis or other abnormality identified. Critical Value/emergent results were called by telephone at the time of interpretation on 05/21/2016 at 6:34 pm to Dr. Melba Coon , who verbally acknowledged these results. Electronically Signed   By: Jeannine Boga M.D.   On: 05/21/2016 18:54   Portable Chest Xray  Result Date: 05/22/2016 CLINICAL DATA:  Intubation . EXAM: PORTABLE CHEST 1 VIEW COMPARISON:  05/21/2016 . FINDINGS: Endotracheal tube and NG tube in stable position. Cardiac pacer with lead tips over the right atrium right ventricle. Prior CABG. Cardiomegaly with mild pulmonary venous congestion and bilateral mild interstitial prominence consistent mild CHF. Small left pleural effusion. Low lung volumes with basilar atelectasis. No pneumothorax . IMPRESSION: 1. Cardiac pacer with lead tips in right atrium right ventricle. Prior CABG. Cardiomegaly with mild pulmonary venous congestion and bilateral interstitial prominence. Small left pleural effusion. Findings consistent with mild CHF. 2. Low lung volumes with basilar atelectasis again noted. Electronically Signed   By: Marcello Moores  Register   On: 05/22/2016 07:26   Portable Chest Xray  Result Date: 05/21/2016 CLINICAL DATA:  Respiratory failure EXAM: PORTABLE CHEST 1 VIEW COMPARISON:  09/05/2015 FINDINGS: Post sternotomy changes. There is a left-sided duo lead pacing device with leads projecting over the right atrium and right ventricle. Endotracheal tube tip partially obscured by overlying sternotomy, it is suspected to be approximately 3.8 cm superior to the carina. Esophageal tube tip is  below the diaphragm but is  not included. The right lung is grossly clear. There is dense left lower lobe consolidation and probable small effusion. There is mild cardiomegaly. IMPRESSION: 1. Dense left lower lobe consolidation may reflect atelectasis or a pneumonia. Suspect that there is a small left effusion 2. Mild cardiomegaly. Electronically Signed   By: Donavan Foil M.D.   On: 05/21/2016 21:56   Dg Abd Portable 1v  Result Date: 05/21/2016 CLINICAL DATA:  Orogastric tube placement EXAM: PORTABLE ABDOMEN - 1 VIEW COMPARISON:  None. FINDINGS: The tip and side port of the or gastric tube project over the gastric body. Nonobstructive bowel gas pattern. No osseous abnormalities. IMPRESSION: Orogastric tube tip and side port overlie the gastric body. Electronically Signed   By: Ulyses Jarred M.D.   On: 05/21/2016 22:06   Ct Head Code Stroke W/o Cm  Result Date: 05/21/2016 CLINICAL DATA:  Code stroke.  Right-sided deficits. EXAM: CT HEAD WITHOUT CONTRAST TECHNIQUE: Contiguous axial images were obtained from the base of the skull through the vertex without intravenous contrast. COMPARISON:  None. FINDINGS: Brain: No evidence of acute infarction, hemorrhage, hydrocephalus, extra-axial collection or mass lesion/mass effect. Right inferior cerebellar encephalomalacia probably representing sequelae of chronic infarction. Vascular: No hyperdense vessel or unexpected calcification. Skull: Normal. Negative for fracture or focal lesion. Sinuses/Orbits: No acute finding. Other: None. ASPECTS [facility] Stroke Program Early CT Score) - Ganglionic level infarction (caudate, lentiform nuclei, internal capsule, insula, M1-M3 cortex): 7 - Supraganglionic infarction (M4-M6 cortex): 3 Total score (0-10 with 10 being normal): 10 IMPRESSION: 1. No acute intracranial abnormality identified. 2. Right inferior cerebellar encephalomalacia, probably representing sequelae of chronic infarction. 3. ASPECTS is 10 These results were called  by telephone at the time of interpretation on 05/21/2016 at 6:19 pm to Dr. Tyrone Nine who verbally acknowledged these results. Electronically Signed   By: Kristine Garbe M.D.   On: 05/21/2016 18:24      Medications:     Current Medications: . aspirin EC  81 mg Oral Daily  . atorvastatin  40 mg Per Tube q1800  . labetalol  20 mg Intravenous Once  . nicotine  14 mg Transdermal Q24H  . pantoprazole  40 mg Oral QHS     Infusions: . sodium chloride 10 mL/hr at 05/23/16 1000  . niCARDipine        Assessment:   1. Embolic CVA 2. Hypotension 3. Chronic Systolic Heart Failure- ICD  4.Current Cigarette Smoker 5. Current Cocaine Abuse- + UDS  Plan/Discussion:    Mr Neloms is a 41 year old admitted with suspected embolic stroke in the setting of severly reduced EF.  S/P successful embolectomy. No residuals. Will need to start coumadin. Discussed with neuro no need for bridge. Mr Shytle is adamant he is going home. Pharmacy provided coumadin education.    From HF perspective he is stable. ECHO EF 15-20%. BP low so for now will need to leave off HF meds and restart in the community.    F/U set up for HF and Coumadin Clinic    Length of Stay: 2  Amy Clegg NP-C  05/23/2016, 11:19 AM  Advanced Heart Failure Team Pager [phone] (M-F; 7a - 4p)  Please contact [facility] for night-coverage after hours (4p -7a ) and weekends on [email or web address]  Patient seen and examined with Darrick Grinder, NP. We discussed all aspects of the encounter. I agree with the assessment and plan as stated above.   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 We  discussed the fact that this is likely cardio-embolic in setting of low EF. Will start warfarin. (Need for bridging per pharmacy). No evidence of AF on tele.   Currently volume status looks good. BP soft so Entresto on hold, Can restart HF meds when we see him in Clinic next week. Long talk  about need for compliance and abstinence from tobacco and cocaine.   Bensimhon, Daniel,MD 1:17 PM

## 2016-05-23 NOTE — Progress Notes (Signed)
Name: John Davenport MRN: [id number] DOB: December 10, 1975    ADMISSION DATE:  05/21/2016 CONSULTATION DATE:  2/21  REFERRING MD :  Dr. Shon Hale   CHIEF COMPLAINT:  CVA  BRIEF PATIENT DESCRIPTION:  41 y.o. 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  He was brought to [facility] ED 02/21 with sudden onset right sided weakness and dysphasia. CTA head showed proximal occlusion of the left M2 segment.  He received tPA and was then taken to IR for revascularization.  SIGNIFICANT EVENTS  2/21> On vent, unresponsive. CTA head 02/21 > large vessel occlusion with distal left M1 / proximal left M2 occlusion > IR  2/22 > Extubated   STUDIES:  CTA head 02/21 > large vessel occlusion with distal left M1 / proximal left M2 occlusion. CT head 02/22 > Neg acute, Neg Bleed   CULTURES: None.  ANTIBIOTICS: None.  SIGNIFICANT EVENTS: 02/21 > admit.  LINES/TUBES: ETT 2/21 > 2/22  SUBJECTIVE:  Off sedation, off Pressors. Extubated overnight.   VITAL SIGNS: Temp:  [97.5 F (36.4 C)-98.3 F (36.8 C)] 97.9 F (36.6 C) (02/23 0800) Pulse Rate:  [52-84] 63 (02/23 1000) Resp:  [10-28] 20 (02/23 1000) BP: (95-139)/(67-114) 109/78 (02/23 1000) SpO2:  [91 %-100 %] 99 % (02/23 1000) FiO2 (%):  [40 %] 40 % (02/22 1534)  PHYSICAL EXAMINATION: General: Adult male, no distress Neuro: Alert, oriented, moves all extremities   HEENT: normocephalic   Cardiovascular: RRR, no MRG, NI S1/S2  Lungs: Clear, non-labored   Abdomen: non-tender, active bowel sounds  Musculoskeletal: no acute   Skin: warm, dry, intact    Recent Labs Lab 05/21/16 1804 05/21/16 1809 05/22/16 0408 05/23/16 0014  NA 140 141 140 143  K 4.7 4.4 3.6 4.2  CL 106 103 106 109  CO2 27  --  25 24  BUN 11 14 9 8   CREATININE 1.37* 1.40* 1.01 1.17  GLUCOSE 99 99 92 94    Recent Labs Lab 05/21/16 1804 05/21/16 1809 05/22/16 0408 05/23/16 0014  HGB 15.1  16.0 13.2 15.3  HCT 46.2 47.0 41.4 47.1  WBC 11.3*  --  8.3 14.8*  PLT 186  --  161 187   Ct Angio Head W Or Wo Contrast  Result Date: 05/21/2016 CLINICAL DATA:  Acute facial droop, right-sided deficits. Prior CT from earlier the same day. EXAM: CT ANGIOGRAPHY HEAD AND NECK TECHNIQUE: Multidetector CT imaging of the head and neck was performed using the standard protocol during bolus administration of intravenous contrast. Multiplanar CT image reconstructions and MIPs were obtained to evaluate the vascular anatomy. Carotid stenosis measurements (when applicable) are obtained utilizing NASCET criteria, using the distal internal carotid diameter as the denominator. CONTRAST:  50 cc of Isovue 370.  Initial evaluation for COMPARISON:  None. FINDINGS: CTA NECK FINDINGS Aortic arch: Visualized aortic arch of normal caliber with normal 3 vessel morphology. No high-grade stenosis seen about the origin of the great vessels. Visualized subclavian arteries widely patent. Right carotid system: Right common carotid artery patent from its origin to the bifurcation. Mild a centric calcified plaque about the right bifurcation without significant stenosis. Right ICA patent distally to the skullbase without significant stenosis, dissection, or vascular occlusion. Left carotid system: Left common carotid artery patent from its origin to the bifurcation. No significant atheromatous narrowing about the left bifurcation. Left ICA patent from the bifurcation to the skullbase without stenosis, dissection, or occlusion. Vertebral arteries: Both  of the vertebral arteries arise from the subclavian arteries. Vertebral arteries widely patent within the neck without stenosis, dissection, or occlusion. Skeleton: Visualized osseous structures within normal limits. No acute osseous abnormality. No worrisome lytic or blastic osseous lesions. Other neck: Visualized soft tissues of the neck demonstrate no acute abnormality. Thyroid normal.  Salivary glands normal. No adenopathy. Upper chest: Visualized mediastinum within normal limits. Visualized lungs are clear. Mild emphysema noted. Review of the MIP images confirms the above findings CTA HEAD FINDINGS Anterior circulation: Petrous, cavernous, and supraclinoid segments of the internal carotid arteries are widely patent bilaterally without flow-limiting stenosis. Focal plaque noted at the supraclinoid right ICA with secondary mild stenosis. A1 segments patent. Right A1 segment mildly hypoplastic. Anterior communicating artery normal. Anterior cerebral arteries patent to their distal aspects. Proximal and mid left M1 segment widely patent. Proximal takeoff of the anterior division of the left MCA which is patent. There is a distal left M1/proximal left M2 occlusion near the base of the sylvian fissure (series 11, image 113). Attenuated flow distally within the left MCA branches. Right M1 segment widely patent without stenosis or occlusion. No proximal right M2 occlusion. Right MCA branches widely patent distally. Posterior circulation: Vertebral arteries patent to the vertebrobasilar junction. Left vertebral artery slightly dominant. Posterior inferior cerebral arteries patent bilaterally. Basilar artery widely patent. Superior cerebral arteries patent bilaterally. Left PCA supplied via the basilar and is widely patent to its distal aspect. Hypoplastic right P1 segment with a mildly prominent right posterior communicating artery. Right PCA also supplied to its distal aspect. Venous sinuses: Patent. Anatomic variants: No significant anatomic variant. No aneurysm or vascular malformation. Delayed phase: Not performed. Review of the MIP images confirms the above findings IMPRESSION: 1. Positive CTA for emergent large vessel occlusion with distal left M1/ proximal left M2 occlusion as above. 2. Additional fairly mild atheromatous disease as above. No other significant flow-limiting stenosis or other  abnormality identified. Critical Value/emergent results were called by telephone at the time of interpretation on 05/21/2016 at 6:34 pm to Dr. Melba Coon , who verbally acknowledged these results. Electronically Signed   By: Jeannine Boga M.D.   On: 05/21/2016 18:54   Ct Head Wo Contrast  Result Date: 05/22/2016 CLINICAL DATA:  24 hour post TPA, Code stroke yesterday with right side deficits. Patient intubated unable to obtain any history Neuro is wanting to extubate patient EXAM: CT HEAD WITHOUT CONTRAST TECHNIQUE: Contiguous axial images were obtained from the base of the skull through the vertex without intravenous contrast. COMPARISON:  05/21/2016 FINDINGS: Brain: No evidence of acute infarction, hemorrhage, hydrocephalus, extra-axial collection or mass lesion/mass effect. Old right cerebellar infarct. The Vascular: No hyperdense vessel or unexpected calcification. Skull: Normal. Negative for fracture or focal lesion. Sinuses/Orbits: No acute finding. Other: Endotracheal tube and orogastric tube are partially visualized. IMPRESSION: 1. Negative for bleed or other acute intracranial process. Electronically Signed   By: Lucrezia Europe M.D.   On: 05/22/2016 17:01   Ct Head Wo Contrast  Result Date: 05/22/2016 CLINICAL DATA:  Status post revascularization of the occluded LEFT M1 MCA achieving TICI3 reperfusion. EXAM: CT HEAD WITHOUT CONTRAST TECHNIQUE: Contiguous axial images were obtained from the base of the skull through the vertex without intravenous contrast. COMPARISON:  CT head without contrast 05/21/2016. CTA head neck 05/21/2016. Cerebral angiogram 05/21/2016. FINDINGS: Brain: No evidence for acute infarction, hemorrhage, mass lesion, hydrocephalus, or extra-axial fluid. Normal cerebral volume. No significant white matter disease. No postprocedural complication such as hemorrhage is evident.  No definite areas of LEFT hemisphere hypoattenuation are seen. Chronic RIGHT inferior cerebellar infarct  is stable. Vascular: Contrast-enhanced blood pool from recent CTA and recent angiography. Proximal vessels appear patent, including LEFT MCA. Skull: Normal. Negative for fracture or focal lesion. Sinuses/Orbits: No acute finding. Other: None. IMPRESSION: Satisfactory appearance post thrombolysis of occluded LEFT M1 MCA. No developing acute infarction or postprocedure hemorrhage is evident. Electronically Signed   By: Staci Righter M.D.   On: 05/22/2016 08:07   Ct Angio Neck W Or Wo Contrast  Result Date: 05/21/2016 CLINICAL DATA:  Acute facial droop, right-sided deficits. Prior CT from earlier the same day. EXAM: CT ANGIOGRAPHY HEAD AND NECK TECHNIQUE: Multidetector CT imaging of the head and neck was performed using the standard protocol during bolus administration of intravenous contrast. Multiplanar CT image reconstructions and MIPs were obtained to evaluate the vascular anatomy. Carotid stenosis measurements (when applicable) are obtained utilizing NASCET criteria, using the distal internal carotid diameter as the denominator. CONTRAST:  50 cc of Isovue 370.  Initial evaluation for COMPARISON:  None. FINDINGS: CTA NECK FINDINGS Aortic arch: Visualized aortic arch of normal caliber with normal 3 vessel morphology. No high-grade stenosis seen about the origin of the great vessels. Visualized subclavian arteries widely patent. Right carotid system: Right common carotid artery patent from its origin to the bifurcation. Mild a centric calcified plaque about the right bifurcation without significant stenosis. Right ICA patent distally to the skullbase without significant stenosis, dissection, or vascular occlusion. Left carotid system: Left common carotid artery patent from its origin to the bifurcation. No significant atheromatous narrowing about the left bifurcation. Left ICA patent from the bifurcation to the skullbase without stenosis, dissection, or occlusion. Vertebral arteries: Both of the vertebral  arteries arise from the subclavian arteries. Vertebral arteries widely patent within the neck without stenosis, dissection, or occlusion. Skeleton: Visualized osseous structures within normal limits. No acute osseous abnormality. No worrisome lytic or blastic osseous lesions. Other neck: Visualized soft tissues of the neck demonstrate no acute abnormality. Thyroid normal. Salivary glands normal. No adenopathy. Upper chest: Visualized mediastinum within normal limits. Visualized lungs are clear. Mild emphysema noted. Review of the MIP images confirms the above findings CTA HEAD FINDINGS Anterior circulation: Petrous, cavernous, and supraclinoid segments of the internal carotid arteries are widely patent bilaterally without flow-limiting stenosis. Focal plaque noted at the supraclinoid right ICA with secondary mild stenosis. A1 segments patent. Right A1 segment mildly hypoplastic. Anterior communicating artery normal. Anterior cerebral arteries patent to their distal aspects. Proximal and mid left M1 segment widely patent. Proximal takeoff of the anterior division of the left MCA which is patent. There is a distal left M1/proximal left M2 occlusion near the base of the sylvian fissure (series 11, image 113). Attenuated flow distally within the left MCA branches. Right M1 segment widely patent without stenosis or occlusion. No proximal right M2 occlusion. Right MCA branches widely patent distally. Posterior circulation: Vertebral arteries patent to the vertebrobasilar junction. Left vertebral artery slightly dominant. Posterior inferior cerebral arteries patent bilaterally. Basilar artery widely patent. Superior cerebral arteries patent bilaterally. Left PCA supplied via the basilar and is widely patent to its distal aspect. Hypoplastic right P1 segment with a mildly prominent right posterior communicating artery. Right PCA also supplied to its distal aspect. Venous sinuses: Patent. Anatomic variants: No significant  anatomic variant. No aneurysm or vascular malformation. Delayed phase: Not performed. Review of the MIP images confirms the above findings IMPRESSION: 1. Positive CTA for emergent large vessel occlusion with  distal left M1/ proximal left M2 occlusion as above. 2. Additional fairly mild atheromatous disease as above. No other significant flow-limiting stenosis or other abnormality identified. Critical Value/emergent results were called by telephone at the time of interpretation on 05/21/2016 at 6:34 pm to Dr. Melba Coon , who verbally acknowledged these results. Electronically Signed   By: Jeannine Boga M.D.   On: 05/21/2016 18:54   Portable Chest Xray  Result Date: 05/22/2016 CLINICAL DATA:  Intubation . EXAM: PORTABLE CHEST 1 VIEW COMPARISON:  05/21/2016 . FINDINGS: Endotracheal tube and NG tube in stable position. Cardiac pacer with lead tips over the right atrium right ventricle. Prior CABG. Cardiomegaly with mild pulmonary venous congestion and bilateral mild interstitial prominence consistent mild CHF. Small left pleural effusion. Low lung volumes with basilar atelectasis. No pneumothorax . IMPRESSION: 1. Cardiac pacer with lead tips in right atrium right ventricle. Prior CABG. Cardiomegaly with mild pulmonary venous congestion and bilateral interstitial prominence. Small left pleural effusion. Findings consistent with mild CHF. 2. Low lung volumes with basilar atelectasis again noted. Electronically Signed   By: Marcello Moores  Register   On: 05/22/2016 07:26   Portable Chest Xray  Result Date: 05/21/2016 CLINICAL DATA:  Respiratory failure EXAM: PORTABLE CHEST 1 VIEW COMPARISON:  09/05/2015 FINDINGS: Post sternotomy changes. There is a left-sided duo lead pacing device with leads projecting over the right atrium and right ventricle. Endotracheal tube tip partially obscured by overlying sternotomy, it is suspected to be approximately 3.8 cm superior to the carina. Esophageal tube tip is below the  diaphragm but is not included. The right lung is grossly clear. There is dense left lower lobe consolidation and probable small effusion. There is mild cardiomegaly. IMPRESSION: 1. Dense left lower lobe consolidation may reflect atelectasis or a pneumonia. Suspect that there is a small left effusion 2. Mild cardiomegaly. Electronically Signed   By: Donavan Foil M.D.   On: 05/21/2016 21:56   Dg Abd Portable 1v  Result Date: 05/21/2016 CLINICAL DATA:  Orogastric tube placement EXAM: PORTABLE ABDOMEN - 1 VIEW COMPARISON:  None. FINDINGS: The tip and side port of the or gastric tube project over the gastric body. Nonobstructive bowel gas pattern. No osseous abnormalities. IMPRESSION: Orogastric tube tip and side port overlie the gastric body. Electronically Signed   By: Ulyses Jarred M.D.   On: 05/21/2016 22:06   Ct Head Code Stroke W/o Cm  Result Date: 05/21/2016 CLINICAL DATA:  Code stroke.  Right-sided deficits. EXAM: CT HEAD WITHOUT CONTRAST TECHNIQUE: Contiguous axial images were obtained from the base of the skull through the vertex without intravenous contrast. COMPARISON:  None. FINDINGS: Brain: No evidence of acute infarction, hemorrhage, hydrocephalus, extra-axial collection or mass lesion/mass effect. Right inferior cerebellar encephalomalacia probably representing sequelae of chronic infarction. Vascular: No hyperdense vessel or unexpected calcification. Skull: Normal. Negative for fracture or focal lesion. Sinuses/Orbits: No acute finding. Other: None. ASPECTS [facility] Stroke Program Early CT Score) - Ganglionic level infarction (caudate, lentiform nuclei, internal capsule, insula, M1-M3 cortex): 7 - Supraganglionic infarction (M4-M6 cortex): 3 Total score (0-10 with 10 being normal): 10 IMPRESSION: 1. No acute intracranial abnormality identified. 2. Right inferior cerebellar encephalomalacia, probably representing sequelae of chronic infarction. 3. ASPECTS is 10 These results were called by  telephone at the time of interpretation on 05/21/2016 at 6:19 pm to Dr. Tyrone Nine who verbally acknowledged these results. Electronically Signed   By: Kristine Garbe M.D.   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 Will sign off as of 2/24.   Hayden Pedro, AG-ACNP [facility]  Pgr: [phone]  PCCM Pgr: [phone]

## 2016-05-23 NOTE — Progress Notes (Signed)
Refused [facility]P

## 2016-05-23 NOTE — Progress Notes (Signed)
STROKE TEAM PROGRESS NOTE   SUBJECTIVE (INTERVAL HISTORY) Was extubated yesterday afternoon.. He became agitated last night and required Precedex for sedation. He is still on pressors to keep his blood pressure up. Follow-up CT scan showed no evidence of stroke or hemorrhage   OBJECTIVE Temp:  [97.5 F (36.4 C)-98.3 F (36.8 C)] 97.9 F (36.6 C) (02/23 0800) Pulse Rate:  [52-84] 63 (02/23 1000) Cardiac Rhythm: Normal sinus rhythm (02/23 0800) Resp:  [10-28] 20 (02/23 1000) BP: (95-139)/(67-114) 109/78 (02/23 1000) SpO2:  [91 %-100 %] 99 % (02/23 1000) FiO2 (%):  [40 %] 40 % (02/22 1534)  CBC:   Recent Labs Lab 05/22/16 0408 05/23/16 0014  WBC 8.3 14.8*  NEUTROABS 4.1 10.0*  HGB 13.2 15.3  HCT 41.4 47.1  MCV 89.8 89.7  PLT 161 [id number]    Basic Metabolic Panel:   Recent Labs Lab 05/22/16 0408 05/23/16 0014  NA 140 143  K 3.6 4.2  CL 106 109  CO2 25 24  GLUCOSE 92 94  BUN 9 8  CREATININE 1.01 1.17  CALCIUM 9.1 9.3  MG  --  2.0  PHOS  --  3.5    HgbA1c:  Lab Results  Component Value Date   HGBA1C 5.4 05/22/2016     Portable Chest Xray  Result Date: 05/22/2016 CLINICAL DATA:  Intubation . EXAM: PORTABLE CHEST 1 VIEW COMPARISON:  05/21/2016 . FINDINGS: Endotracheal tube and NG tube in stable position. Cardiac pacer with lead tips over the right atrium right ventricle. Prior CABG. Cardiomegaly with mild pulmonary venous congestion and bilateral mild interstitial prominence consistent mild CHF. Small left pleural effusion. Low lung volumes with basilar atelectasis. No pneumothorax . IMPRESSION: 1. Cardiac pacer with lead tips in right atrium right ventricle. Prior CABG. Cardiomegaly with mild pulmonary venous congestion and bilateral interstitial prominence. Small left pleural effusion. Findings consistent with mild CHF. 2. Low lung volumes with basilar atelectasis again noted. Electronically Signed   By: Marcello Moores  Register   On: 05/22/2016 07:26    PHYSICAL  EXAM Middle aged frail  Caucasiansian male  . Marland Kitchen Afebrile. Head is nontraumatic. Neck is supple without bruit.    Cardiac exam Soft ejection murmur no gallop. Lungs are clear to auscultation. Distal pulses are well felt. Neurological Exam :  Patient is awake alert and interactive. No aphasia or apraxia dysarthria.. Pupils 3 mm equal reactive. Extraocular movements are full range without nystagmus. Blinks to threat bilaterally. Corneals present bilaterally. Fundi could not be visualized. Moves all 4 extremities equally well against gravity. No focal weakness. Deep tendon reflexes are 1+ symmetric. Plantars are downgoing. NIH  stroke scale 0 ASSESSMENT/PLAN John Davenport is a 41 y.o. 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 He received IV t-PA 05/21/2016 at 1850. CTA showed ELVO LM1/M2. Taken to IR s/p Lt common carotid arteriogram,followed by TICI 3 revascularization  with x 1 pass with solitaire.  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 Workup underway  Resultant right hemiparesis  Code Stroke CT no acute abnormality. R inferior cerebellar encephalomalacia. Aspects 10.  CTA head and neck ELVO distal L M1/proximal L M2  Cerebral angio L M1/M2 occlusion s/p TICI 3 revascularization with solitaire  Post IR CT satisfactory appearance post IR. No hemorrhage.  24h CT no acute infarct or bleed  2D Echo  Left ventricle: The cavity size was severely dilated. Posterior   wall  thickness was mildly increased. Systolic function was   severely reduced. The estimated ejection fraction was in the   range of 15% to 20%. Diffuse hypokinesis. Akinesis of the   anteroseptal, inferoseptal and mild to apical anterior    myocardium  LDL 78  HgbA1c 5.4  SCDs for VTE prophylaxis Diet Heart Room service appropriate? Yes; Fluid consistency: Thin  aspirin 81 mg daily prior to  admission, now on aspirin 81 mg daily (received 2 doses, 1 at 0015 and 1 at 1059, ordered by Lorna Dibble, PA. Discussed with RN). Will hold antithrombotics until 24h CT imaging negative for hemorrhage  D/c sheath  Therapy recommendations:  pending   Disposition:  pending   Acute respiratory Failure  Intubated for neuro intervention  CCM on board  Hypertension  Ran low during the night, on neo, cannot tolerate increased fluids d/t low EF  From stroke standpoint, Permissive hypertension (OK if < 220/120) but gradually normalize in 5-7 days   Hyperlipidemia  Home meds:  lipitor 40, resumed in hospital  LDL just above goal  Continue statin at discharge  Other Stroke Risk Factors  Cigarette smoker, advised to stop smoking  Smokeless tobacco user  UDS positive for opiates and cocaine  Obstructive sleep apnea, ? CPAP at home  Cardiac disease  Coronary artery disease - s/p CABG, stent  Chronic systolic CHF - EF [id number]  Hx PVT defrillator placement   Other Active Problems  AKI  Cocaine abuse  Hospital day # [place] for Pager information 05/23/2016 10:04 AM  I have personally examined this patient, reviewed notes, independently viewed imaging studies, participated in medical decision making and plan of care.ROS completed by me personally and pertinent positives fully documented  I have made any additions or clarifications directly to the above note. Agree with note above. He presented with right hemiparesis and facial droop secondary to left MCA infarct due to embolic left M1 occlusion treated with IV TPA and successful mechanical embolectomy with complete revascularization.  Plan gallbladder out of bed today. Physical occupational and speech therapy consults. Patient may have a diet after swallow eval. Heart failure team consult to help cardiac medication management particularly long-term anticoagulation. I'm not sure patient is a  good long-term candidate for anticoagulation given his noncompliance and history. I had a long discussion the bedside with the patient  and answered questions about his clinical presentation, treatment and evaluation plan. The patient became belligerent during my rounds and wanted to leave started cussing. I talked to him and quietened down. Plan to wean off Precedex and vasopressors today and optimize his cardiac medications. Discharge home in the next couple of days if medically and neurologically stable. Final decision on long-term anticoagulation after input from heart failure team  This patient is critically ill and at significant risk of neurological worsening, death and care requires constant monitoring of vital signs, hemodynamics,respiratory and cardiac monitoring, extensive review of multiple databases, frequent neurological assessment, discussion with family, other specialists and medical decision making of high complexity.I have made any additions or clarifications directly to the above note.This critical care time does not reflect procedure time, or teaching time or supervisory time of PA/NP/Med Resident etc but could involve care discussion time.  I spent 30 minutes of neurocritical care time  in the care of  this patient.     Antony Contras, MD Medical Director [facility] Pager: [phone] 05/23/2016 10:04 AM  To contact Stroke Continuity  provider, please refer to [email or web address] After hours, contact General Neurology

## 2016-05-23 NOTE — Evaluation (Signed)
Physical Therapy Evaluation Patient Details Name: John Davenport MRN: [id number] DOB: Nov 02, 1975 Today's Date: 05/23/2016   History of Present Illness  Patient is a 41 y/o male with hx of CAd, MI, ICD, paroxysmal ventricular tachycardia presents with right sided weakness and difficulty speaking. CTA head-proximal occlusion of the left M2 segment. s/p tPA and revascularization.  Clinical Impression  Patient presents with low BP limiting mobility assessment s/p above. Reports he feels back to baseline with regards to strength and speech. See BP below. Supine BP 76/49 Sitting EOB BP 80/56 Sitting BP post standing 74/53 Sitting BP ~2 mins 87/65 Pt reports feeling dizzy at home all the time especially when he first stands up- states it has to do with his heart medicine. Discussed risk of falls due to low BP and safety techniques to prevent this. Cardiology to follow to see if any change in medications is necessary. Will follow to further assess functional mobility and higher level balance next session as tolerated. RN present and aware.    Follow Up Recommendations No PT follow up;Supervision for mobility/OOB    Equipment Recommendations  None recommended by PT    Recommendations for Other Services       Precautions / Restrictions Precautions Precautions: Fall Precaution Comments: watch BP Restrictions Weight Bearing Restrictions: No      Mobility  Bed Mobility Overal bed mobility: Modified Independent                Transfers Overall transfer level: Needs assistance Equipment used: None Transfers: Sit to/from Stand Sit to Stand: Supervision         General transfer comment: Supervision due to dizziness upon standing and low BP.  Ambulation/Gait Ambulation/Gait assistance:  (deferred secondary to low BP.)              Stairs            Wheelchair Mobility    Modified Rankin (Stroke Patients Only) Modified Rankin (Stroke Patients  Only) Pre-Morbid Rankin Score: Slight disability Modified Rankin: Slight disability     Balance Overall balance assessment: Needs assistance Sitting-balance support: Feet supported;No upper extremity supported Sitting balance-Leahy Scale: Good Sitting balance - Comments: Able to reach outside BoS and donn socks without difficulty.    Standing balance support: During functional activity Standing balance-Leahy Scale: Fair Standing balance comment: ABle to stand without UE support. 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      Hand Dominance   Dominant Hand: Right    Extremity/Trunk Assessment   Upper Extremity Assessment Upper Extremity Assessment: Defer to OT evaluation    Lower Extremity Assessment Lower Extremity Assessment: Overall WFL for tasks assessed    Cervical / Trunk Assessment Cervical / Trunk Assessment: Normal  Communication   Communication: No difficulties  Cognition Arousal/Alertness: Awake/alert Behavior During Therapy: WFL for tasks assessed/performed Overall Cognitive Status: Within Functional Limits for tasks assessed  General Comments General comments (skin integrity, edema, etc.): Mother and aunt present during session.    Exercises     Assessment/Plan    PT Assessment Patient needs continued PT services  PT Problem List Decreased balance;Decreased activity tolerance;Decreased mobility       PT Treatment Interventions Therapeutic activities;Gait  training;Therapeutic exercise;Patient/family education;Balance training;Stair training;Functional mobility training;Neuromuscular re-education    PT Goals (Current goals can be found in the Care Plan section)  Acute Rehab PT Goals Patient Stated Goal: to get up and walk and go home PT Goal Formulation: With patient Time For Goal Achievement: 06/06/16 Potential to Achieve Goals: Good    Frequency Min 4X/week   Barriers to discharge Decreased caregiver support      Co-evaluation               End of Session Equipment Utilized During Treatment: Gait belt Activity Tolerance: Treatment limited secondary to medical complications (Comment) (soft BP) Patient left: in bed;with call bell/phone within reach;with family/visitor present Nurse Communication: Mobility status PT Visit Diagnosis: Dizziness and giddiness (R42)         Time: [id number] PT Time Calculation (min) (ACUTE ONLY): 19 min   Charges:   PT Evaluation $PT Eval Moderate Complexity: 1 Procedure     PT G Codes:         Otila Starn A Coutney Wildermuth 05/23/2016, 11:02 AM Wray Kearns, PT, DPT [phone]

## 2016-05-23 NOTE — Evaluation (Signed)
Occupational Therapy Evaluation Patient Details Name: John Davenport MRN: [id number] DOB: [birth year] Today's Date: 05/23/2016    History of Present Illness Patient is a 41 y/o male with hx of CAd, MI, ICD, paroxysmal ventricular tachycardia presents with right sided weakness and difficulty speaking. CTA head-proximal occlusion of the left M2 segment. s/p tPA and revascularization.   Clinical Impression   Patient evaluated by Occupational Therapy with no further acute OT needs identified. All education has been completed and the patient has no further questions. Pt is independent with ADLs.  No LOB noted.  BP 110/70 - pt denies dizziness.  He appears to be at baseline.   See below for any follow-up Occupational Therapy or equipment needs. OT is signing off. Thank you for this referral.        Follow Up Recommendations   No follow up OT     Equipment Recommendations  None recommended by OT    Recommendations for Other Services  none      Precautions / Restrictions Precautions Precautions: None      Mobility Bed Mobility Overal bed mobility: Independent                Transfers Overall transfer level: Independent                    Balance Overall balance assessment: No apparent balance deficits (not formally assessed)                               Standardized Balance Assessment Standardized Balance Assessment : Dynamic Gait Index   Dynamic Gait Index Level Surface: Normal Change in Gait Speed: Normal Gait with Horizontal Head Turns: Normal Gait with Vertical Head Turns: Normal Gait and Pivot Turn: Normal Step Over Obstacle: Normal Step Around Obstacles: Normal      ADL Overall ADL's : Independent                                             Vision Baseline Vision/History: No visual deficits Patient Visual Report: No change from baseline Vision Assessment?: Yes Eye Alignment: Within Functional  Limits Ocular Range of Motion: Within Functional Limits Alignment/Gaze Preference: Within Defined Limits Tracking/Visual Pursuits: Able to track stimulus in all quads without difficulty Visual Fields: No apparent deficits Additional Comments: Pt uses phone independently      Agricultural engineer Tested?: Yes   Praxis Praxis Praxis tested?: Within functional limits    Pertinent Vitals/Pain Pain Assessment: No/denies pain     Hand Dominance Right   Extremity/Trunk Assessment Upper Extremity Assessment Upper Extremity Assessment: Overall WFL for tasks assessed   Lower Extremity Assessment Lower Extremity Assessment: Defer to PT evaluation   Cervical / Trunk Assessment Cervical / Trunk Assessment: Normal   Communication Communication Communication: No difficulties   Cognition Arousal/Alertness: Awake/alert Behavior During Therapy: WFL for tasks assessed/performed Overall Cognitive Status: Within Functional Limits for tasks assessed                     General Comments  Pt denies dizziness.  BP 110/70 at end of session     Exercises       Shoulder Instructions      Home Living Family/patient expects to be discharged to:: Private residence Living Arrangements: Children;Other relatives (brother and son ) Available  Help at Discharge: Family;Available PRN/intermittently Type of Home: House Home Access: Level entry;Ramped entrance     Home Layout: One level     Bathroom Shower/Tub: Occupational psychologist: Standard     Home Equipment: None      Lives With: Family    Prior Functioning/Environment Level of Independence: Independent        Comments: Pt is disabled.  does not drive.  He works on cars for fun.          OT Problem List: Decreased strength      OT Treatment/Interventions:      OT Goals(Current goals can be found in the care plan section) Acute Rehab OT Goals Patient Stated Goal: to go home  OT Goal  Formulation: All assessment and education complete, DC therapy  OT Frequency:     Barriers to D/C:            Co-evaluation              End of Session Equipment Utilized During Treatment: Gait belt Nurse Communication: Mobility status  Activity Tolerance: Patient tolerated treatment well Patient left: in bed;with call bell/phone within reach  OT Visit Diagnosis: Hemiplegia and hemiparesis Hemiplegia - Right/Left: Right Hemiplegia - dominant/non-dominant: Dominant Hemiplegia - caused by: Cerebral infarction                ADL either performed or assessed with clinical judgement  Time: 1440-1501 OT Time Calculation (min): 21 min Charges:  OT General Charges $OT Visit: 1 Procedure OT Evaluation $OT Eval Low Complexity: 1 Procedure G-Codes:     Lucille Passy, OTR/L [id number]   [place], Pearly Bartosik M 05/23/2016, 3:20 PM

## 2016-05-23 NOTE — Evaluation (Signed)
Speech Language Pathology Evaluation Patient Details Name: John Davenport MRN: [id number] DOB: [birth year] Today's Date: 05/23/2016 Time: [id number] SLP Time Calculation (min) (ACUTE ONLY): 11 min  Problem List:  Patient Active Problem List   Diagnosis Date Noted  . Acute ischemic stroke ([place]) 05/21/2016  . Acute hypoxemic respiratory failure ([place])   . Cerebrovascular accident (CVA) due to thrombosis of left middle cerebral artery ([place])   . Chronic systolic congestive heart failure ([place])   . Cough with sputum 04/17/2016  . Intractable upper abdominal pain 03/09/2016  . CKD (chronic kidney disease), stage III 03/09/2016  . 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 RUQ abdominal pain 03/09/2016  . NSTEMI (non-ST elevated myocardial infarction) ([place]) 09/05/2015  . Ischemic cardiomyopathy 09/05/2015  . Tobacco abuse 09/05/2015  . Hyperlipidemia 09/05/2015  . Renal insufficiency 09/05/2015  . OSA (obstructive sleep apnea) 12/30/2010  . Cough 12/09/2010  . Dental abscess 12/09/2010  . GERD 04/10/2010  . PAROXYSMAL VENTRICULAR TACHYCARDIA 03/22/2010  . Chronic combined systolic and diastolic CHF (congestive heart failure) ([place]) 03/22/2010  . Generalized anxiety disorder 05/16/2009  . MYOCARDIAL INFARCTION 12/19/2008  . CAD in native artery 12/19/2008   Past Medical History:  Past Medical History:  Diagnosis Date  . Anxiety state, unspecified   . Back pain   . Cancer ([place])    Lung CA  . Chronic systolic heart failure ([place])    a. Ischemic CM (EF 30% by echo in 2011) b. echo 08/2015: EF 20% with severe HK and AK of the inferoseptal and apical myocardium.  . Coronary artery disease    a. 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  b. eventually required single vessel CABG (L-LAD) in 03/2009 due to restenosis. c. cath 2013 showed patent LIMA sequential to the diags with LAD filling retrograde, no new disease in RCA and Cx, low LVEDP, sx felt  noncardiac. d. 08/2015: NSTEMI: CTO of prox LAD, 99% stenosis of small dCx (no stent), + for cocaine  . Esophageal reflux   . Heart attack   . Hypercholesteremia   . ICD (implantable cardioverter-defibrillator) infection ([place])    a. [place] Sci ICD 12/2008.   . Ischemic cardiomyopathy    echocardiogram 12/11: Mild LVH, EF 30-35%, anteroseptal and apical akinesis, grade 1 diastolic dysfunction  . LBP (low back pain)   . OSA (obstructive sleep apnea) 12/30/2010  . Paroxysmal ventricular tachycardia ([place])   . Tobacco abuse    Past Surgical History:  Past Surgical History:  Procedure Laterality Date  . CARDIAC CATHETERIZATION N/A 09/06/2015   Procedure: Left Heart Cath and Cors/Grafts Angiography;  Surgeon: Sherren Mocha, MD;  Location: [place] [facility];  Service: Cardiovascular;  Laterality: N/A;  . CARDIAC DEFIBRILLATOR PLACEMENT    . CARDIAC DEFIBRILLATOR PLACEMENT     2010 by JA  . CORONARY ANGIOPLASTY WITH STENT PLACEMENT     LAD stenting 10/09 and re-opening 6/10  . CORONARY ARTERY BYPASS GRAFT     1/11  . LEFT HEART CATHETERIZATION WITH CORONARY ANGIOGRAM N/A 08/07/2011   Procedure: LEFT HEART CATHETERIZATION WITH CORONARY ANGIOGRAM;  Surgeon: Josue Hector, MD;  Location: [facility] C[facility] LAB;  Service: Cardiovascular;  Laterality: N/A;  . RADIOLOGY WITH ANESTHESIA N/A 05/21/2016   Procedure: RADIOLOGY WITH ANESTHESIA;  Surgeon: Luanne Bras, MD;  Location: [facility];  Service: Radiology;  Laterality: N/A;   HPI:  41 y.o.malewith history of multiple MIs and ischmic cardiomyopathy with EF 20%presenting with R hemiparesis, expressive  aphasia, dysarthria. CTA showed distal left M1/proximal left M2 occlusion. He received tPA then was taken to IR for revascularization. He was intubated 2/21-2/22.   Assessment / Plan / Recommendation Clinical Impression  Pt appears to be at his cognitive-linguistic baseline. He verbalizes good ancitipatory/safety awareness s/p acute CVA. Verbal expression  is fluent and he denies any subjective complaints. SLP to sign off.    SLP Assessment  SLP Recommendation/Assessment: Patient does not need any further Speech Lanaguage Pathology Services SLP Visit Diagnosis: Cognitive communication deficit (R41.841)    Follow Up Recommendations  None    Frequency and Duration           SLP Evaluation Cognition  Overall Cognitive Status: Within Functional Limits for tasks assessed Orientation Level: Oriented X4       Comprehension  Auditory Comprehension Overall Auditory Comprehension: Appears within functional limits for tasks assessed    Expression Expression Primary Mode of Expression: Verbal Verbal Expression Overall Verbal Expression: Appears within functional limits for tasks assessed   Oral / Motor  Oral Motor/Sensory Function Overall Oral Motor/Sensory Function: Within functional limits Motor Speech Overall Motor Speech: Appears within functional limits for tasks assessed   GO                    [name] 05/23/2016, 9:43 AM  [name], M.A. CCC-SLP [phone]

## 2016-05-23 NOTE — Evaluation (Signed)
Clinical/Bedside Swallow Evaluation Patient Details  Name: John Davenport MRN: [id number] Date of Birth: 1975-12-09  Today's Date: 05/23/2016 Time: SLP Start Time (ACUTE ONLY): 0907 SLP Stop Time (ACUTE ONLY): 0917 SLP Time Calculation (min) (ACUTE ONLY): 10 min  Past Medical History:  Past Medical History:  Diagnosis Date  . Anxiety state, unspecified   . Back pain   . Cancer ([place])    Lung CA  . Chronic systolic heart failure ([place])    a. Ischemic CM (EF 30% by echo in 2011) b. echo 08/2015: EF 20% with severe HK and AK of the inferoseptal and apical myocardium.  . Coronary artery disease    a. 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  b. eventually required single vessel CABG (L-LAD) in 03/2009 due to restenosis. c. cath 2013 showed patent LIMA sequential to the diags with LAD filling retrograde, no new disease in RCA and Cx, low LVEDP, sx felt noncardiac. d. 08/2015: NSTEMI: CTO of prox LAD, 99% stenosis of small dCx (no stent), + for cocaine  . Esophageal reflux   . Heart attack   . Hypercholesteremia   . ICD (implantable cardioverter-defibrillator) infection ([place])    a. [employer] ICD 12/2008.   . Ischemic cardiomyopathy    echocardiogram 12/11: Mild LVH, EF 30-35%, anteroseptal and apical akinesis, grade 1 diastolic dysfunction  . LBP (low back pain)   . OSA (obstructive sleep apnea) 12/30/2010  . Paroxysmal ventricular tachycardia ([place])   . Tobacco abuse    Past Surgical History:  Past Surgical History:  Procedure Laterality Date  . CARDIAC CATHETERIZATION N/A 09/06/2015   Procedure: Left Heart Cath and Cors/Grafts Angiography;  Surgeon: Sherren Mocha, MD;  Location: [facility];  Service: Cardiovascular;  Laterality: N/A;  . CARDIAC DEFIBRILLATOR PLACEMENT    . CARDIAC DEFIBRILLATOR PLACEMENT     2010 by JA  . CORONARY ANGIOPLASTY WITH STENT PLACEMENT     LAD stenting 10/09 and re-opening 6/10  . CORONARY ARTERY BYPASS  GRAFT     1/11  . LEFT HEART CATHETERIZATION WITH CORONARY ANGIOGRAM N/A 08/07/2011   Procedure: LEFT HEART CATHETERIZATION WITH CORONARY ANGIOGRAM;  Surgeon: Josue Hector, MD;  Location: [facility] CATH [facility];  Service: Cardiovascular;  Laterality: N/A;  . RADIOLOGY WITH ANESTHESIA N/A 05/21/2016   Procedure: RADIOLOGY WITH ANESTHESIA;  Surgeon: Luanne Bras, MD;  Location: [facility];  Service: Radiology;  Laterality: N/A;   HPI:  41 y.o.malewith history of multiple MIs and ischmic cardiomyopathy with EF 20%presenting with R hemiparesis, expressive aphasia, dysarthria. CTA showed distal left M1/proximal left M2 occlusion. He received tPA then was taken to IR for revascularization. He was intubated 2/21-2/22.   Assessment / Plan / Recommendation Clinical Impression  Pt's oropharyngeal swallow appears grossly WFL. He did have one cough following a straw sip of thin liquid, which occurred when he was talking during intake. No other overt s/s of aspiration observed across all textures, and his voice/cough are strong. Esophageal precautions reviewed given h/o GERD. Recommend regular textures and thin liquids. No SLP f/u necessary. SLP Visit Diagnosis: Dysphagia, unspecified (R13.10)    Aspiration Risk  No limitations    Diet Recommendation Regular;Thin liquid   Liquid Administration via: Straw;Cup Medication Administration: Whole meds with liquid Supervision: Patient able to self feed;Intermittent supervision to cue for compensatory strategies Compensations: Slow rate;Small sips/bites;Follow solids with liquid Postural Changes: Seated upright at 90 degrees;Remain upright for at least 30 minutes after po  intake    Other  Recommendations Oral Care Recommendations: Oral care BID   Follow up Recommendations None      Frequency and Duration            Prognosis        Swallow Study   General HPI: 41 y.o.malewith history of multiple MIs and ischmic cardiomyopathy with EF 20%presenting  with R hemiparesis, expressive aphasia, dysarthria. CTA showed distal left M1/proximal left M2 occlusion. He received tPA then was taken to IR for revascularization. He was intubated 2/21-2/22. Type of Study: Bedside Swallow Evaluation Previous Swallow Assessment: none in chart Diet Prior to this Study: NPO Temperature Spikes Noted: No Respiratory Status: Room air History of Recent Intubation: Yes Length of Intubations (days): 1 days Date extubated: 05/22/16 Behavior/Cognition: Alert;Cooperative;Pleasant mood Oral Cavity Assessment: Within Functional Limits Oral Care Completed by SLP: No Oral Cavity - Dentition: Adequate natural dentition;Missing dentition Vision: Functional for self-feeding Self-Feeding Abilities: Able to feed self Patient Positioning: Upright in bed Baseline Vocal Quality: Normal Volitional Cough: Strong Volitional Swallow: Able to elicit    Oral/Motor/Sensory Function Overall Oral Motor/Sensory Function: Within functional limits   Ice Chips Ice chips: Within functional limits Presentation: Spoon   Thin Liquid Thin Liquid: Impaired Presentation: Cup;Self Fed;Straw Pharyngeal  Phase Impairments: Cough - Immediate (x1)    Nectar Thick Nectar Thick Liquid: Not tested   Honey Thick Honey Thick Liquid: Not tested   Puree Puree: Within functional limits Presentation: Self Fed;Spoon   Solid   GO   Solid: Within functional limits Presentation: Self Ennis Forts 05/23/2016,9:39 AM  [name], M.A. CCC-SLP [phone]

## 2016-05-23 NOTE — Discharge Instructions (Signed)

## 2016-05-28 ENCOUNTER — Inpatient Hospital Stay (HOSPITAL_COMMUNITY): Admission: RE | Admit: 2016-05-28 | Payer: Medicare Other | Source: Ambulatory Visit

## 2016-05-30 ENCOUNTER — Telehealth (HOSPITAL_COMMUNITY): Payer: Self-pay

## 2016-05-30 NOTE — Telephone Encounter (Signed)
Called to schedule 2 wk f/u, left message for pt to return call. AW 

## 2016-06-03 ENCOUNTER — Ambulatory Visit (HOSPITAL_COMMUNITY)
Admission: RE | Admit: 2016-06-03 | Discharge: 2016-06-03 | Disposition: A | Discharge status: Home / Self Care | Payer: Medicare Other | Source: Ambulatory Visit | Attending: Cardiology | Admitting: Cardiology

## 2016-06-03 ENCOUNTER — Other Ambulatory Visit (HOSPITAL_COMMUNITY): Payer: Self-pay

## 2016-06-03 ENCOUNTER — Ambulatory Visit (HOSPITAL_COMMUNITY): Payer: Self-pay | Admitting: Pharmacist

## 2016-06-03 ENCOUNTER — Encounter (HOSPITAL_COMMUNITY): Payer: Self-pay

## 2016-06-03 VITALS — BP 124/90 | HR 98 | Wt 226.4 lb

## 2016-06-03 DIAGNOSIS — I2510 Atherosclerotic heart disease of native coronary artery without angina pectoris: Secondary | ICD-10-CM | POA: Insufficient documentation

## 2016-06-03 DIAGNOSIS — Z951 Presence of aortocoronary bypass graft: Secondary | ICD-10-CM | POA: Diagnosis not present

## 2016-06-03 DIAGNOSIS — I5042 Chronic combined systolic (congestive) and diastolic (congestive) heart failure: Secondary | ICD-10-CM

## 2016-06-03 DIAGNOSIS — I6340 Cerebral infarction due to embolism of unspecified cerebral artery: Secondary | ICD-10-CM

## 2016-06-03 DIAGNOSIS — I255 Ischemic cardiomyopathy: Secondary | ICD-10-CM | POA: Diagnosis not present

## 2016-06-03 DIAGNOSIS — Z7982 Long term (current) use of aspirin: Secondary | ICD-10-CM | POA: Diagnosis not present

## 2016-06-03 DIAGNOSIS — Z0001 Encounter for general adult medical examination with abnormal findings: Secondary | ICD-10-CM | POA: Diagnosis not present

## 2016-06-03 DIAGNOSIS — Z8673 Personal history of transient ischemic attack (TIA), and cerebral infarction without residual deficits: Secondary | ICD-10-CM | POA: Insufficient documentation

## 2016-06-03 DIAGNOSIS — J449 Chronic obstructive pulmonary disease, unspecified: Secondary | ICD-10-CM | POA: Diagnosis not present

## 2016-06-03 DIAGNOSIS — Z7901 Long term (current) use of anticoagulants: Secondary | ICD-10-CM | POA: Diagnosis not present

## 2016-06-03 DIAGNOSIS — I252 Old myocardial infarction: Secondary | ICD-10-CM | POA: Diagnosis not present

## 2016-06-03 DIAGNOSIS — Z9889 Other specified postprocedural states: Secondary | ICD-10-CM | POA: Diagnosis not present

## 2016-06-03 DIAGNOSIS — Z79899 Other long term (current) drug therapy: Secondary | ICD-10-CM | POA: Insufficient documentation

## 2016-06-03 DIAGNOSIS — I25812 Atherosclerosis of bypass graft of coronary artery of transplanted heart without angina pectoris: Secondary | ICD-10-CM | POA: Diagnosis not present

## 2016-06-03 DIAGNOSIS — I639 Cerebral infarction, unspecified: Secondary | ICD-10-CM | POA: Insufficient documentation

## 2016-06-03 DIAGNOSIS — K219 Gastro-esophageal reflux disease without esophagitis: Secondary | ICD-10-CM | POA: Insufficient documentation

## 2016-06-03 DIAGNOSIS — G4733 Obstructive sleep apnea (adult) (pediatric): Secondary | ICD-10-CM | POA: Insufficient documentation

## 2016-06-03 DIAGNOSIS — I5022 Chronic systolic (congestive) heart failure: Secondary | ICD-10-CM | POA: Insufficient documentation

## 2016-06-03 LAB — PROTIME-INR
INR: 1.21
Prothrombin Time: 15.4 seconds — ABNORMAL HIGH (ref 11.4–15.2)

## 2016-06-03 MED ORDER — CARVEDILOL 3.125 MG PO TABS: 3.1250 mg | ORAL_TABLET | Freq: Two times a day (BID) | ORAL | 6 refills | Status: DC

## 2016-06-03 MED ORDER — FUROSEMIDE 40 MG PO TABS: 40.0000 mg | ORAL_TABLET | Freq: Every day | ORAL | 3 refills | Status: DC

## 2016-06-03 MED ORDER — POTASSIUM CHLORIDE CRYS ER 20 MEQ PO TBCR: 20.0000 meq | EXTENDED_RELEASE_TABLET | Freq: Every day | ORAL | 3 refills | Status: DC

## 2016-06-03 MED ORDER — NICOTINE 14 MG/24HR TD PT24: 14.0000 mg | MEDICATED_PATCH | TRANSDERMAL | 0 refills | Status: DC

## 2016-06-03 MED ORDER — SACUBITRIL-VALSARTAN 24-26 MG PO TABS: 1.0000 | ORAL_TABLET | Freq: Two times a day (BID) | ORAL | 6 refills | Status: DC

## 2016-06-03 MED ORDER — WARFARIN SODIUM 7.5 MG PO TABS: 7.5000 mg | ORAL_TABLET | Freq: Every day | ORAL | 2 refills | Status: DC

## 2016-06-03 MED ORDER — SPIRONOLACTONE 25 MG PO TABS: 12.5000 mg | ORAL_TABLET | Freq: Every day | ORAL | 3 refills | Status: DC

## 2016-06-03 MED FILL — CARVEDILOL 3.125 MG TABLET: 3.125 | 30 days supply | Qty: 60 | Fill #0

## 2016-06-03 MED FILL — FUROSEMIDE 40 MG TABLET: 40 | 30 days supply | Qty: 30 | Fill #0

## 2016-06-03 MED FILL — SPIRONOLACTONE 25 MG TABLET: 25 | 30 days supply | Qty: 15 | Fill #0

## 2016-06-03 MED FILL — KLOR-CON M20 TABLET: 20 | 30 days supply | Qty: 30 | Fill #0

## 2016-06-03 NOTE — Patient Instructions (Addendum)
INR was checked today - You have been scheduled to follow up with coumadin clinic Monday 06/09/2016 at 10:30 am. Address: [address] ([place]), [place], [place] [address]  Phone: [phone]  START Lasix 40 mg tablet once daily.  START Potassium 20 meq (1 tablet) once daily.  START Entresto 24/26 mg tablet twice daily.  START Spironolactone 12.5 mg (1/2 tablet) once daily.  START Carvedilol (Coreg) 3.125 mg (1 tablet) twice daily.  Routine lab work today. Will notify you of abnormal results, otherwise no news is good news!  Return in approximately 10 days for repeat labs.  Follow up 2 weeks with Oda Kilts PA-C.  Do the following things EVERYDAY: 1) Weigh yourself in the morning before breakfast. Write it down and keep it in a log. 2) Take your medicines as prescribed 3) Eat low salt foods-Limit salt (sodium) to 2000 mg per day.  4) Stay as active as you can everyday 5) Limit all fluids for the day to less than 2 liters

## 2016-06-03 NOTE — Progress Notes (Signed)
Paramedicine Encounter   Patient ID: John Davenport , male,   DOB: [birth year],40 y.o.,  MRN: [id number]   Met patient in clinic today with provider. 1st meeting with pt. He states he has not had a phone but recently got it back on and the number listed is correct. Pt recently had stroke-no lasting symptoms from that. He gets very sob upon exertion. Med changes as noted in providers notes. Still smoking about 1.5 pk every 2 days. Smokes weed occasionally, he states he has quit drinking and cocaine. States he has small disability check and he does not have a lot of money for meds food etc. Is getting his meds from outpt pharm.  Has 2 small boys, mom has them right now since he is sick but he states she doesn't want them. Lives at moms house with his older son-his mom lives in [place] right now working and will be coming home "soon" when she retires but couldn't really tell me when. He states he will go stay with her this wknd and his uncle will take him down there. He may be getting [employer] starting April 1.  Time spent with patient 57mn  Alasha Mcguinness, EMT-Paramedic 06/03/2016

## 2016-06-04 NOTE — Progress Notes (Signed)
Cardiology: Dr. Aundra Dubin  41 yo with history of CAD s/p CABG, chronic systolic CHF (ischemic cardiomyopathy), and COPD presents for CHF clinic evaluation.  He had his first MI with occlusion of LAD in 10/9 treated with BMS.  He then had POBA of reocclusion of LAD stent in 6/10. Finally, he had sequential LIMA-D1 and D2 in 1/11.  NSTEMI in 6/17 (cocaine positive) showed 99% stenosis small distal LCx as likely culprit. This was medically managed. Echo in 6/17 showed EF 20% with wall motion abnormalities.   He was admitted in 12/17 with RUQ abdominal pain.  There was concern for acute cholecystitis based on CT of abdomen, but HIDA scan was negative. He left AMA.    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  UDS was positive for cocaine. CTA showed occlusion of left M2 segment of MCA.  He had tPA and mechanical thrombectomy.  He had complete recovery with resolution of neurologic symptoms.  Suspicion for cardio-embolic cause (LV thrombus), so warfarin was started. He was taken off of most of his cardiac meds due to low BP.   He continues warfarin but has not been to coumadin clinic since discharge.  No BRBPR or melena.  Still smoking 1/2 ppd, does not think Chantix helped.  He does not feel good, short of breath after walking 100 feet or going up an incline.  Worse than prior to admission.  He attributes to having stopped his meds.  BP is ok today.  No chest pain.  No further neurologic symptoms.    ECG (12/17): NSR, IVCD 139 msec  Labs (6/17): K 3.8, creatinine 1.26, HCT 42.4, LDL 81, HDL 34  Labs (10/17): K 4, creatinine 1.42, LDL 122, BNP 475 Labs (12/17): K 4, creatinine 1.39, hgb 13.6 Labs (2/18): K 4.2, creatinine 1.17, LDL 78, hgb 15.3  PMH: 1. GERD 2. OSA 3. H/o VT 4. COPD: Active smoker. 5. Chronic systolic CHF: Ischemic cardiomyopathy. Has [employer] ICD.  - Echo (6/17): EF 20%, wall motion abnormalities, mild MR - CPX (11/17): peak VO2 19.7 (51%  predicted), VE/VCO2 slope 46.9, RER 0.92 => Submaximal, but probably moderate to severe functional impairment => suspect restrictive/obstructive lung disease and HF play a role in the abnormality, probably moderate impairment in terms of HF.  - Echo (2/18): EF 15-20%, severe LV dilation, mildly decreased RV systolic function, PASP 45 mmHg.  6. CAD: Acute MI in 10/09 with BMS to LAD - POBA to LAD stent in 6/10.  - CABG with sequential LIMA-D1 and D2 in 1/11. - NSTEMI 6/17: LHC with patent sequential LIMA-D1 and D2 with filling of LAD, occlusion proximal LAD, subtotal occlusion of distal LCx into small OM (likely culprit).  No PCI. He was cocaine positive.  7. Symptomatic cholelithiasis: Negative HIDA scan 12/17.  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  Thought to be due to cardio-embolism (LV thrombus).   SH: Disabled, active smoker, history of cocaine use, no ETOH, lives in [place].  FH: Father with 1st MI in his 54s.  ROS: All systems reviewed and negative except as per HPI.   Current Outpatient Prescriptions  Medication Sig Dispense Refill  . aspirin EC 81 MG EC tablet Take 1 tablet (81 mg total) by mouth daily.    Marland Kitchen atorvastatin (LIPITOR) 40 MG tablet Take 1 tablet (40 mg total) by mouth daily. 30 tablet 2  . carvedilol (COREG) 3.125 MG tablet Take 1 tablet (3.125  mg total) by mouth 2 (two) times daily. 60 tablet 6  . furosemide (LASIX) 40 MG tablet Take 1 tablet (40 mg total) by mouth daily. 90 tablet 3  . nicotine (NICODERM CQ - DOSED IN MG/24 HOURS) 14 mg/24hr patch Place 1 patch (14 mg total) onto the skin daily. 28 patch 0  . nitroGLYCERIN (NITROSTAT) 0.4 MG SL tablet Place 1 tablet (0.4 mg total) under the tongue every 5 (five) minutes as needed for chest pain. (Patient not taking: Reported on 06/03/2016) 100 tablet 0  . potassium chloride SA (K-DUR,KLOR-CON) 20 MEQ tablet Take 1 tablet (20 mEq total) by mouth daily. 90 tablet 3   . sacubitril-valsartan (ENTRESTO) 24-26 MG Take 1 tablet by mouth 2 (two) times daily. 60 tablet 6  . spironolactone (ALDACTONE) 25 MG tablet Take 0.5 tablets (12.5 mg total) by mouth daily. 45 tablet 3  . warfarin (COUMADIN) 7.5 MG tablet Take 1-1.5 tablets (7.5-11.25 mg total) by mouth daily at 6 PM. Take 7.5 mg (1 tablet) daily except 11.25 mg (1 and 1/2 tablet) on Tues/Thurs/Sat. 34 tablet 2   No current facility-administered medications for this encounter.    BP 124/90 (BP Location: Left Arm, Patient Position: Sitting, Cuff Size: Normal)   Pulse 98   Wt 226 lb 6.4 oz (102.7 kg)   SpO2 99%   BMI 29.07 kg/m  General: NAD. Walked in the clinic.   Neck: JVP not elevated, no thyromegaly or thyroid nodule.  Lungs: CTAB. On room air.  CV: Nondisplaced PMI.  Regular S1/S2, no S3, no murmur.  No lower extremity edema. No carotid bruit.   Abdomen: Soft, no hepatosplenomegaly, no distention.  Mild RUQ tenderness. .  Skin: Intact without lesions or rashes.  Neurologic: Alet and oriented x3  Psych: Normal affect. Extremities: No clubbing or cyanosis. Warm HEENT: Normal.   Assessment/Plan: 1. CAD: s/p CABG.  6/17 NSTEMI with subtotal occlusion small distal LCx managed medically.  No chest pain.  - Continue ASA 81.   - Continue statin. He is back on atorvastatin, good lipids in 2/18.   2. Chronic systolic CHF: Ischemic CMP.  [place].  EF 15-20% on 2/18 echo.  COPD likely also plays a role in his dyspnea. Submaximal CPX in 11/17 but suggestive of both heart and lung limitation => appears to have moderate limitation in terms of heart failure. NYHA class III symptoms, worse since his meds were stopped last admission.  He does not, however, appear markedly volume overloaded.  - Restart spironolactone 12.5 daily, Coreg 3.125 mg bid, Entresto 24/26 bid.  - Restart Lasix 40 daily + KCl 20 daily.  - BMET in 10 days.  3. Active smoker: Encouraged to quit smoking.  Chantix did not work,  will try nicotine patches.  ing. He declines smoking cessation.  4. CVA: Possibly cardio-embolic, LV thrombus.  He is on warfarin but has not been to coumadin clinic.  - Check INR today, send to coumadin clinic and schedule his followup.  - When INR > 2, he can stop ASA.    Loralie Champagne 06/04/2016

## 2016-06-09 ENCOUNTER — Ambulatory Visit (INDEPENDENT_AMBULATORY_CARE_PROVIDER_SITE_OTHER): Payer: Medicare Other | Admitting: Pharmacist

## 2016-06-09 DIAGNOSIS — I255 Ischemic cardiomyopathy: Secondary | ICD-10-CM

## 2016-06-09 DIAGNOSIS — I6340 Cerebral infarction due to embolism of unspecified cerebral artery: Secondary | ICD-10-CM | POA: Diagnosis not present

## 2016-06-09 DIAGNOSIS — Z5181 Encounter for therapeutic drug level monitoring: Secondary | ICD-10-CM | POA: Diagnosis not present

## 2016-06-09 LAB — POCT INR: INR: 3.6

## 2016-06-16 ENCOUNTER — Ambulatory Visit (HOSPITAL_BASED_OUTPATIENT_CLINIC_OR_DEPARTMENT_OTHER)
Admission: RE | Admit: 2016-06-16 | Discharge: 2016-06-16 | Disposition: A | Discharge status: Home / Self Care | Payer: Medicare Other | Source: Ambulatory Visit | Attending: Cardiology | Admitting: Cardiology

## 2016-06-16 ENCOUNTER — Ambulatory Visit (HOSPITAL_COMMUNITY)
Admission: RE | Admit: 2016-06-16 | Discharge: 2016-06-16 | Disposition: A | Discharge status: Home / Self Care | Payer: Medicare Other | Source: Ambulatory Visit | Attending: Radiology | Admitting: Radiology

## 2016-06-16 ENCOUNTER — Encounter (HOSPITAL_COMMUNITY): Payer: Self-pay

## 2016-06-16 ENCOUNTER — Other Ambulatory Visit (HOSPITAL_COMMUNITY): Payer: Self-pay

## 2016-06-16 ENCOUNTER — Ambulatory Visit (INDEPENDENT_AMBULATORY_CARE_PROVIDER_SITE_OTHER): Payer: Medicare Other | Admitting: *Deleted

## 2016-06-16 VITALS — BP 102/78 | HR 73 | Wt 217.2 lb

## 2016-06-16 DIAGNOSIS — Z720 Tobacco use: Secondary | ICD-10-CM

## 2016-06-16 DIAGNOSIS — Z8249 Family history of ischemic heart disease and other diseases of the circulatory system: Secondary | ICD-10-CM | POA: Insufficient documentation

## 2016-06-16 DIAGNOSIS — I2510 Atherosclerotic heart disease of native coronary artery without angina pectoris: Secondary | ICD-10-CM

## 2016-06-16 DIAGNOSIS — Z955 Presence of coronary angioplasty implant and graft: Secondary | ICD-10-CM | POA: Diagnosis not present

## 2016-06-16 DIAGNOSIS — I6602 Occlusion and stenosis of left middle cerebral artery: Secondary | ICD-10-CM | POA: Diagnosis not present

## 2016-06-16 DIAGNOSIS — I252 Old myocardial infarction: Secondary | ICD-10-CM | POA: Insufficient documentation

## 2016-06-16 DIAGNOSIS — I5022 Chronic systolic (congestive) heart failure: Secondary | ICD-10-CM | POA: Insufficient documentation

## 2016-06-16 DIAGNOSIS — I6340 Cerebral infarction due to embolism of unspecified cerebral artery: Secondary | ICD-10-CM | POA: Diagnosis not present

## 2016-06-16 DIAGNOSIS — Z5181 Encounter for therapeutic drug level monitoring: Secondary | ICD-10-CM | POA: Diagnosis not present

## 2016-06-16 DIAGNOSIS — I255 Ischemic cardiomyopathy: Secondary | ICD-10-CM | POA: Insufficient documentation

## 2016-06-16 DIAGNOSIS — F17210 Nicotine dependence, cigarettes, uncomplicated: Secondary | ICD-10-CM | POA: Diagnosis not present

## 2016-06-16 DIAGNOSIS — N183 Chronic kidney disease, stage 3 unspecified: Secondary | ICD-10-CM

## 2016-06-16 DIAGNOSIS — K219 Gastro-esophageal reflux disease without esophagitis: Secondary | ICD-10-CM | POA: Insufficient documentation

## 2016-06-16 DIAGNOSIS — Z951 Presence of aortocoronary bypass graft: Secondary | ICD-10-CM | POA: Diagnosis not present

## 2016-06-16 DIAGNOSIS — Z7901 Long term (current) use of anticoagulants: Secondary | ICD-10-CM | POA: Diagnosis not present

## 2016-06-16 DIAGNOSIS — Z7982 Long term (current) use of aspirin: Secondary | ICD-10-CM | POA: Insufficient documentation

## 2016-06-16 DIAGNOSIS — G4733 Obstructive sleep apnea (adult) (pediatric): Secondary | ICD-10-CM | POA: Insufficient documentation

## 2016-06-16 DIAGNOSIS — J449 Chronic obstructive pulmonary disease, unspecified: Secondary | ICD-10-CM | POA: Insufficient documentation

## 2016-06-16 DIAGNOSIS — I63312 Cerebral infarction due to thrombosis of left middle cerebral artery: Secondary | ICD-10-CM

## 2016-06-16 DIAGNOSIS — Z8673 Personal history of transient ischemic attack (TIA), and cerebral infarction without residual deficits: Secondary | ICD-10-CM | POA: Diagnosis not present

## 2016-06-16 HISTORY — PX: IR GENERIC HISTORICAL: IMG1180011

## 2016-06-16 LAB — BASIC METABOLIC PANEL
Anion gap: 6 (ref 5–15)
BUN: 19 mg/dL (ref 6–20)
CO2: 27 mmol/L (ref 22–32)
CREATININE: 1.34 mg/dL — AB (ref 0.61–1.24)
Calcium: 9.4 mg/dL (ref 8.9–10.3)
Chloride: 108 mmol/L (ref 101–111)
GFR calc Af Amer: >60 mL/min (ref >60)
GFR calc non Af Amer: >60 mL/min (ref >60)
GLUCOSE: 80 mg/dL (ref 65–99)
Potassium: 4.2 mmol/L (ref 3.5–5.1)
Sodium: 141 mmol/L (ref 135–145)

## 2016-06-16 LAB — CBC
HCT: 47.7 % (ref 39.0–52.0)
Hemoglobin: 15.5 g/dL (ref 13.0–17.0)
MCH: 29.4 pg (ref 26.0–34.0)
MCHC: 32.5 g/dL (ref 30.0–36.0)
MCV: 90.5 fL (ref 78.0–100.0)
PLATELETS: 233 10*3/uL (ref 150–400)
RBC: 5.27 MIL/uL (ref 4.22–5.81)
RDW: 14.3 % (ref 11.5–15.5)
WBC: 12.4 10*3/uL — ABNORMAL HIGH (ref 4.0–10.5)

## 2016-06-16 LAB — POCT INR: INR: 1.2

## 2016-06-16 LAB — BRAIN NATRIURETIC PEPTIDE: B Natriuretic Peptide: 337.2 pg/mL — ABNORMAL HIGH (ref 0.0–100.0)

## 2016-06-16 MED ORDER — SPIRONOLACTONE 25 MG PO TABS: 12.5000 mg | ORAL_TABLET | Freq: Every day | ORAL | 3 refills | Status: DC

## 2016-06-16 MED FILL — NICOTINE 14 MG/24HR PATCH: 14 | 28 days supply | Qty: 28 | Fill #0

## 2016-06-16 NOTE — Progress Notes (Signed)
CSW met with patient in the clinic with paramedicine. Patient reports he received a letter of denial from medicaid but did not bring letter with him. Patient also showed CSW new Medicare D prescription card. Patient states that the letter with the card stated effective 06/29/16. Patient pleased to have prescription drug coverage but concerned about the implications of premiums on his monthly check. CSW encouraged patient to bring copy of letter from [facility] for review at next appointment. Patient applied for extra help program for premium coverage but unclear if he was approved. CSW will continue to follow and assist with understanding and follow up for medicaid/extra help programs. Raquel Sarna, [place], [place]

## 2016-06-16 NOTE — Progress Notes (Addendum)
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 male with history  of CAD s/p CABG, chronic systolic CHF (ischemic cardiomyopathy), and COPD presents for CHF clinic evaluation.  He had his first MI with occlusion of LAD in 10/9 treated with BMS.  He then had POBA of reocclusion of LAD stent in 6/10. Finally, he had sequential LIMA-D1 and D2 in 1/11.  NSTEMI in 6/17 (cocaine positive) showed 99% stenosis small distal LCx as likely culprit. This was medically managed. Echo in 6/17 showed EF 20% with wall motion abnormalities.   He was admitted in 12/17 with RUQ abdominal pain.  There was concern for acute cholecystitis based on CT of abdomen, but HIDA scan was negative. He left AMA.    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  UDS was positive for cocaine. CTA showed occlusion of left M2 segment of MCA.  He had tPA and mechanical thrombectomy.  He had complete recovery with resolution of neurologic symptoms.  Suspicion for cardio-embolic cause (LV thrombus), so warfarin was started. He was taken off of most of his cardiac meds due to low BP.   Pt presents today for follow up.    Continues to smoke, but cutting back. Only smoked a pack last week.  Plans to pick up nicotine patches today. Thinks he has only been taking Entresto once daily.    Hasn't taken any medications this morning. Still only walking about 100 feet or shorter up an incline prior to getting SOB.  Denies CP. Denies neurologic symptoms. Recent INR supratherapeutic. States he does have some blood at time when he has a bowel movement. Follows up with coumadin clinic today. Gets lightheaded every time he stands up.   ECG (12/17): NSR, IVCD 139 msec  Labs (6/17): K 3.8, creatinine 1.26, HCT 42.4, LDL 81, HDL 34  Labs (10/17): K 4, creatinine 1.42, LDL 122, BNP 475 Labs (12/17): K 4, creatinine 1.39, hgb 13.6 Labs (2/18): K 4.2, creatinine 1.17, LDL 78, hgb  15.3  PMH: 1. GERD 2. OSA 3. H/o VT 4. COPD: Active smoker. 5. Chronic systolic CHF: Ischemic cardiomyopathy. Has [employer] ICD.  - Echo (6/17): EF 20%, wall motion abnormalities, mild MR - CPX (11/17): peak VO2 19.7 (51% predicted), VE/VCO2 slope 46.9, RER 0.92 => Submaximal, but probably moderate to severe functional impairment => suspect restrictive/obstructive lung disease and HF play a role in the abnormality, probably moderate impairment in terms of HF.  - Echo (2/18): EF 15-20%, severe LV dilation, mildly decreased RV systolic function, PASP 45 mmHg.  6. CAD: Acute MI in 10/09 with BMS to LAD - POBA to LAD stent in 6/10.  - CABG with sequential LIMA-D1 and D2 in 1/11. - NSTEMI 6/17: LHC with patent sequential LIMA-D1 and D2 with filling of LAD, occlusion proximal LAD, subtotal occlusion of distal LCx into small OM (likely culprit).  No PCI. He was cocaine positive.  7. Symptomatic cholelithiasis: Negative HIDA scan 12/17.  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  Thought to be due to cardio-embolism (LV thrombus).   SH: Disabled, active smoker, history of cocaine use, no ETOH, lives in [place].  FH: Father with 1st MI in his 80s.  ROS: All systems reviewed and negative except as per HPI.   Current Outpatient Prescriptions  Medication Sig Dispense Refill  . aspirin EC 81 MG EC tablet Take 1 tablet (  81 mg total) by mouth daily.    Marland Kitchen atorvastatin (LIPITOR) 40 MG tablet Take 1 tablet (40 mg total) by mouth daily. 30 tablet 2  . carvedilol (COREG) 3.125 MG tablet Take 1 tablet (3.125 mg total) by mouth 2 (two) times daily. 60 tablet 6  . furosemide (LASIX) 40 MG tablet Take 1 tablet (40 mg total) by mouth daily. 90 tablet 3  . nicotine (NICODERM CQ - DOSED IN MG/24 HOURS) 14 mg/24hr patch Place 1 patch (14 mg total) onto the skin daily. 28 patch 0  . nitroGLYCERIN (NITROSTAT) 0.4 MG SL tablet Place 1 tablet (0.4 mg  total) under the tongue every 5 (five) minutes as needed for chest pain. 100 tablet 0  . potassium chloride SA (K-DUR,KLOR-CON) 20 MEQ tablet Take 1 tablet (20 mEq total) by mouth daily. 90 tablet 3  . sacubitril-valsartan (ENTRESTO) 24-26 MG Take 1 tablet by mouth 2 (two) times daily. 60 tablet 6  . spironolactone (ALDACTONE) 25 MG tablet Take 0.5 tablets (12.5 mg total) by mouth daily. 45 tablet 3  . warfarin (COUMADIN) 7.5 MG tablet Take 1-1.5 tablets (7.5-11.25 mg total) by mouth daily at 6 PM. Take 7.5 mg (1 tablet) daily except 11.25 mg (1 and 1/2 tablet) on Tues/Thurs/Sat. 34 tablet 2   No current facility-administered medications for this encounter.    BP 102/78   Pulse 73   Wt 217 lb 3.2 oz (98.5 kg)   SpO2 99%   BMI 27.89 kg/m    Wt Readings from Last 3 Encounters:  06/16/16 217 lb 3.2 oz (98.5 kg)  06/03/16 226 lb 6.4 oz (102.7 kg)  05/21/16 213 lb 13.5 oz (97 kg)    General: NAD. Poor hygiene. Ambulated into clinic without difficulty.    Neck: JVP does not appear elevated. No thyromegaly or thyroid nodule.  Lungs: Clear, normal effort.   CV: Nondisplaced PMI.  Regular S1/S2, no S3, no murmur.  No peripheral edema. No carotid bruit.   Abdomen: Soft, NT, ND, no HSM. No bruits or masses. +BS  Skin: Intact without lesions or rashes.  Neurologic: Alet and oriented x3  Psych: Normal affect. Extremities: No clubbing or cyanosis. Warm. HEENT: Normal.   Assessment/Plan: 1. CAD: s/p CABG.  6/17 NSTEMI with subtotal occlusion small distal LCx managed medically.  No chest pain.  - Stop ASA on Coumadin without stable CAD.    - Continue atorvastatin 40 mg daily. Stable lipids in 2/18.   2. Chronic systolic CHF: Ischemic CMP.  [place].  EF 15-20% on 2/18 echo.  COPD likely also plays a role in his dyspnea. Submaximal CPX in 11/17 but suggestive of both heart and lung limitation => appears to have moderate limitation in terms of heart failure.  - NYHA Class III symptoms.  Volume status stable on exam - Continue spironolactone 12.5 mg, but move to bedtime.  - Continue coreg 3.125 mg BID - Continue Entresto 24/26 mg BID. Needs to take BID every day. BMET today - Continue lasix Lasix 40 daily + KCl 20 daily. Although he had mild lightheadedness with standing, he is NOT orthostatic. - Reinforced fluid restriction to < 2 L daily, sodium restriction to less than 2000 mg daily, and the importance of daily weights.   3. Active smoker:  - Encouraged to stop smoking completely. Going to pick up nicotine patches today. 4. CVA: Possibly cardio-embolic, LV thrombus.   - Now following up with coumadin clinic.   - OK to stop ASA.  -  States having some BRBPR. Recent INR supratherapeutic. Stressed importance of discussing this with INR clinic.  - Will follow. If continues will need GI. CBC today.  5. CKD Stage III - Follow closely with medications and any adjustments. BMET today.   HFSW to see today. No room to up-titrate meds with softer pressures and lightheadedness. Will have see Pharm-D for med titration in 2 weeks, and follow up MD side in 6 weeks. If remains lightheaded, may need to change Entresto to Losartan (Less anti-hypertensive effect).   Shirley Friar, PA-C  06/16/2016  Greater than 50% of the 25 minute visit was spent in counseling/coordination of care regarding tobacco cessation, INR follow up, medication reconciliation, and social work/Paramedicine discussions.

## 2016-06-16 NOTE — Patient Instructions (Signed)
Labs today (will call for abnormal results, otherwise no news is good news)  STOP Aspirin  START taking Spironolactone Daily at bedtime  Follow up with Doroteo Bradford, PharmD in 2 Weeks  Follow up with Dr. Aundra Dubin in 6 weeks

## 2016-06-16 NOTE — Addendum Note (Signed)
Encounter addended by: Louann Liv, LCSW on: 06/16/2016  1:48 PM<BR>    Actions taken: Sign clinical note

## 2016-06-16 NOTE — Progress Notes (Signed)
Medication Samples have been provided to the patient.  Drug name: Delene Loll      Strength: 24/26 mg       Qty: 28  LOT: [id number]  Exp.Date: aug 18  Dosing instructions: Take One Table by mouth 2 times Daily  The patient has been instructed regarding the correct time, dose, and frequency of taking this medication, including desired effects and most common side effects.   Kennieth Rad 9:33 AM 06/16/2016

## 2016-06-16 NOTE — Progress Notes (Signed)
Paramedicine Encounter   Patient ID: John Davenport , male,   DOB: 10/08/1975,40 y.o.,  MRN: [id number]   Met patient in clinic today with provider. Pt states he has been staying with his mom and hasnt been feeling well with the season changing. Last week pt reported that he wasn't home the days when I tried to plan a home visit but today he agreed for me to come out Thursday morning.  Often bleeding when having BM-he c/o dizziness when standing. Only smoked a pack of cigarettes last week. Andy checked orthostatics-negative for that. Andy wants to move his spiro to bedtime. He can stop the ASA also.   Katie Lynch, EMT-Paramedic  06/16/2016   ACTION: Home visit completed Next visit planned for thursday       

## 2016-06-17 ENCOUNTER — Encounter (HOSPITAL_COMMUNITY): Payer: Self-pay | Admitting: Interventional Radiology

## 2016-06-19 ENCOUNTER — Other Ambulatory Visit (HOSPITAL_COMMUNITY): Payer: Self-pay

## 2016-06-19 ENCOUNTER — Other Ambulatory Visit (HOSPITAL_COMMUNITY): Payer: Self-pay | Admitting: Pharmacist

## 2016-06-19 MED ORDER — WARFARIN SODIUM 7.5 MG PO TABS: ORAL_TABLET | ORAL | 2 refills | Status: DC

## 2016-06-19 NOTE — Progress Notes (Signed)
Paramedicine Encounter    Patient ID: [name], male    DOB: 1976-02-28, 41 y.o.   MRN: [id number]   Patient Care Team: No Pcp Per Patient as PCP - General (General Practice)  Patient Active Problem List   Diagnosis Date Noted  . Encounter for therapeutic drug monitoring 06/09/2016  . Embolic stroke ([place]) 93/79/0240  . Hypotension 05/23/2016  . Smokeless tobacco use 05/23/2016  . Cocaine abuse 05/23/2016  . Cerebrovascular accident (CVA) due to thrombosis of left middle cerebral artery (HCC) s/p IV tPA and mechanical thrombectomy   . Chronic systolic congestive heart failure ([place])   . Cough with sputum 04/17/2016  . Intractable upper abdominal pain 03/09/2016  . CKD (chronic kidney disease), stage III 03/09/2016  . 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 RUQ abdominal pain 03/09/2016  . NSTEMI (non-ST elevated myocardial infarction) ([place]) 09/05/2015  . Ischemic cardiomyopathy 09/05/2015  . Tobacco abuse 09/05/2015  . Hyperlipidemia 09/05/2015  . Renal insufficiency 09/05/2015  . OSA (obstructive sleep apnea) 12/30/2010  . Cough 12/09/2010  . Dental abscess 12/09/2010  . GERD 04/10/2010  . PAROXYSMAL VENTRICULAR TACHYCARDIA 03/22/2010  . Chronic combined systolic and diastolic CHF (congestive heart failure) ([place]) 03/22/2010  . Generalized anxiety disorder 05/16/2009  . MYOCARDIAL INFARCTION 12/19/2008  . CAD in native artery 12/19/2008    Current Outpatient Prescriptions:  .  atorvastatin (LIPITOR) 40 MG tablet, Take 1 tablet (40 mg total) by mouth daily., Disp: 30 tablet, Rfl: 2 .  carvedilol (COREG) 3.125 MG tablet, Take 1 tablet (3.125 mg total) by mouth 2 (two) times daily., Disp: 60 tablet, Rfl: 6 .  furosemide (LASIX) 40 MG tablet, Take 1 tablet (40 mg total) by mouth daily., Disp: 90 tablet, Rfl: 3 .  nicotine (NICODERM CQ - DOSED IN MG/24 HOURS) 14 mg/24hr patch, Place 1 patch (14 mg total) onto the skin daily., Disp: 28 patch, Rfl: 0 .  potassium  chloride SA (K-DUR,KLOR-CON) 20 MEQ tablet, Take 1 tablet (20 mEq total) by mouth daily., Disp: 90 tablet, Rfl: 3 .  sacubitril-valsartan (ENTRESTO) 24-26 MG, Take 1 tablet by mouth 2 (two) times daily., Disp: 60 tablet, Rfl: 6 .  spironolactone (ALDACTONE) 25 MG tablet, Take 0.5 tablets (12.5 mg total) by mouth at bedtime., Disp: 45 tablet, Rfl: 3 .  nitroGLYCERIN (NITROSTAT) 0.4 MG SL tablet, Place 1 tablet (0.4 mg total) under the tongue every 5 (five) minutes as needed for chest pain. (Patient not taking: Reported on 06/19/2016), Disp: 100 tablet, Rfl: 0 .  warfarin (COUMADIN) 7.5 MG tablet, Take 7.5 mg (1 tablet) daily except 11.25 mg (1 and 1/2 tablet) on Tues/Thurs/Sat., Disp: 34 tablet, Rfl: 2 No Known Allergies   Social History   Social History  . Marital status: Single    Spouse name: N/A  . Number of children: 1  . Years of education: N/A   Occupational History  . disabled Unemployed   Social History Main Topics  . Smoking status: Current Every Day Smoker    Packs/day: 0.30    Years: 20.00    Types: Cigarettes  . Smokeless tobacco: Former Systems developer    Types: Chew  . Alcohol use No  . Drug use: No     Comment: REMOTE H/O SUBSTANCE ABUSE  . Sexual activity: Not on file   Other Topics Concern  . Not on file   Social History Narrative   Pt lives with his mom, brother, and 75 yo son in [place].   Quit tobacco in Jan.  2011 prior to heart surgery (prior -1/2 PPD (down from 1 ppd per day, onset age 44)    alcohol abuse- prior, hasn't drank since age 66   no illicit drug use    Currently unemployed, awaiting disability   Single          Physical Exam      Future Appointments Date Time Provider [place]  06/23/2016 8:30 AM [facility]  06/25/2016 10:25 AM [facility] DEVICE REMOTES [facility]  07/09/2016 9:00 AM Garvin Fila, MD [facility] None  07/09/2016 2:00 PM [facility] None   07/28/2016 9:20 AM [facility] CLINIC [facility] None   BP 104/72   Pulse 84   Resp 16  Weight yesterday-225 Last visit weight-217  This is first visit at the home.  Pt just got up when I arrived. He reports he is feeling good.   He did not fill up his pill box yet so he is letting me fill it for him-meds verified and pillbox refilled.  He picked up the nicotine patches and he says so far its working.  He states the coumadin is expensive and he isnt able to afford it-its $13 at outpt pharmacy-called [facility] to see if it was on $4 list and they advised Pt is weighing daily. He did not weigh this morning since they are in his brothers room and the door locked. His mom has one more year before she can retire, still working in Orthoptist.  He c/o dizziness when he stands, no orthostatic changes-bp standing was 100/pal. Advised him to stand up slowly and take his time.  He states he will likely be getting his 2 small boys today to stay with him some, advised him if he needed anything for them to let us know and offered our help with meds too as he has trouble finding rides to get to and from.   ACTION: Home visit completed Next visit planned for next week   Marylouise Stacks, EMT-Paramedic  06/19/16

## 2016-06-23 ENCOUNTER — Ambulatory Visit (INDEPENDENT_AMBULATORY_CARE_PROVIDER_SITE_OTHER): Payer: Medicare Other | Admitting: *Deleted

## 2016-06-23 DIAGNOSIS — I255 Ischemic cardiomyopathy: Secondary | ICD-10-CM | POA: Diagnosis not present

## 2016-06-23 DIAGNOSIS — Z5181 Encounter for therapeutic drug level monitoring: Secondary | ICD-10-CM | POA: Diagnosis not present

## 2016-06-23 DIAGNOSIS — I6340 Cerebral infarction due to embolism of unspecified cerebral artery: Secondary | ICD-10-CM | POA: Diagnosis not present

## 2016-06-23 LAB — POCT INR: INR: 1.4

## 2016-06-25 ENCOUNTER — Telehealth: Payer: Self-pay | Admitting: Cardiology

## 2016-06-25 ENCOUNTER — Ambulatory Visit (INDEPENDENT_AMBULATORY_CARE_PROVIDER_SITE_OTHER): Payer: Medicare Other | Admitting: *Deleted

## 2016-06-25 DIAGNOSIS — I255 Ischemic cardiomyopathy: Secondary | ICD-10-CM

## 2016-06-25 NOTE — Telephone Encounter (Signed)
LMOVM reminding pt to send remote transmission.   

## 2016-06-26 LAB — CUP PACEART REMOTE DEVICE CHECK
Battery Remaining Longevity: 54 mo
Battery Remaining Percentage: 62 %
Brady Statistic RA Percent Paced: 0 %
Brady Statistic RV Percent Paced: 0 %
Date Time Interrogation Session: 20180328185300
HighPow Impedance: 59 Ohm
Implantable Lead Implant Date: 20101005
Implantable Lead Implant Date: 20101005
Implantable Lead Location: 753859
Implantable Lead Location: 753860
Implantable Lead Model: 158
Implantable Lead Model: 4136
Implantable Lead Serial Number: 28628809
Implantable Lead Serial Number: 300495
Implantable Pulse Generator Implant Date: 20101005
Lead Channel Impedance Value: 449 Ohm
Lead Channel Impedance Value: 551 Ohm
Lead Channel Pacing Threshold Amplitude: 0.7 V
Lead Channel Pacing Threshold Amplitude: 1.2 V
Lead Channel Pacing Threshold Pulse Width: 0.4 ms
Lead Channel Pacing Threshold Pulse Width: 0.4 ms
Lead Channel Setting Pacing Amplitude: 2 V
Lead Channel Setting Pacing Amplitude: 2.8 V
Lead Channel Setting Pacing Pulse Width: 0.4 ms
Lead Channel Setting Sensing Sensitivity: 0.6 mV
Pulse Gen Serial Number: 152061

## 2016-06-26 NOTE — Progress Notes (Signed)
Remote ICD transmission.   

## 2016-06-27 ENCOUNTER — Encounter: Payer: Self-pay | Admitting: Cardiology

## 2016-06-30 ENCOUNTER — Ambulatory Visit (INDEPENDENT_AMBULATORY_CARE_PROVIDER_SITE_OTHER): Payer: Medicare Other

## 2016-06-30 DIAGNOSIS — I6340 Cerebral infarction due to embolism of unspecified cerebral artery: Secondary | ICD-10-CM

## 2016-06-30 DIAGNOSIS — Z5181 Encounter for therapeutic drug level monitoring: Secondary | ICD-10-CM | POA: Diagnosis not present

## 2016-06-30 DIAGNOSIS — I255 Ischemic cardiomyopathy: Secondary | ICD-10-CM

## 2016-06-30 LAB — POCT INR: INR: 1.1

## 2016-07-09 ENCOUNTER — Encounter: Payer: Self-pay | Admitting: Neurology

## 2016-07-09 ENCOUNTER — Ambulatory Visit (HOSPITAL_COMMUNITY)
Admission: RE | Admit: 2016-07-09 | Discharge: 2016-07-09 | Disposition: A | Discharge status: Home / Self Care | Payer: Medicare Other | Source: Ambulatory Visit | Attending: Cardiology | Admitting: Cardiology

## 2016-07-09 ENCOUNTER — Ambulatory Visit (INDEPENDENT_AMBULATORY_CARE_PROVIDER_SITE_OTHER): Payer: Medicare Other | Admitting: *Deleted

## 2016-07-09 ENCOUNTER — Ambulatory Visit (INDEPENDENT_AMBULATORY_CARE_PROVIDER_SITE_OTHER): Payer: Medicare Other | Admitting: Neurology

## 2016-07-09 VITALS — Wt 211.0 lb

## 2016-07-09 VITALS — BP 106/64 | HR 76 | Ht 75.0 in | Wt 211.8 lb

## 2016-07-09 DIAGNOSIS — I6340 Cerebral infarction due to embolism of unspecified cerebral artery: Secondary | ICD-10-CM

## 2016-07-09 DIAGNOSIS — R9431 Abnormal electrocardiogram [ECG] [EKG]: Secondary | ICD-10-CM | POA: Insufficient documentation

## 2016-07-09 DIAGNOSIS — Z5181 Encounter for therapeutic drug level monitoring: Secondary | ICD-10-CM

## 2016-07-09 DIAGNOSIS — I63412 Cerebral infarction due to embolism of left middle cerebral artery: Secondary | ICD-10-CM

## 2016-07-09 DIAGNOSIS — I255 Ischemic cardiomyopathy: Secondary | ICD-10-CM

## 2016-07-09 DIAGNOSIS — I5022 Chronic systolic (congestive) heart failure: Secondary | ICD-10-CM | POA: Insufficient documentation

## 2016-07-09 LAB — BASIC METABOLIC PANEL
Anion gap: 9 (ref 5–15)
BUN: 11 mg/dL (ref 6–20)
CHLORIDE: 106 mmol/L (ref 101–111)
CO2: 25 mmol/L (ref 22–32)
Calcium: 9.3 mg/dL (ref 8.9–10.3)
Creatinine, Ser: 1.2 mg/dL (ref 0.61–1.24)
GFR calc Af Amer: >60 mL/min (ref >60)
GFR calc non Af Amer: >60 mL/min (ref >60)
GLUCOSE: 78 mg/dL (ref 65–99)
POTASSIUM: 3.9 mmol/L (ref 3.5–5.1)
Sodium: 140 mmol/L (ref 135–145)

## 2016-07-09 LAB — POCT INR: INR: 1

## 2016-07-09 MED ORDER — ATORVASTATIN CALCIUM 80 MG PO TABS: 80.0000 mg | ORAL_TABLET | Freq: Every day | ORAL | 11 refills | Status: DC

## 2016-07-09 MED ORDER — SACUBITRIL-VALSARTAN 24-26 MG PO TABS: 1.0000 | ORAL_TABLET | Freq: Two times a day (BID) | ORAL | 6 refills | Status: DC

## 2016-07-09 NOTE — Progress Notes (Signed)
HF MD: [facility]  HPI:  John Davenport is a 41 y.o. Caucasian male with history  of CAD s/p CABG, chronic systolic CHF (ischemic cardiomyopathy), and COPD presents for CHF clinic evaluation.  He had his first MI with occlusion of LAD in 10/9 treated with BMS.  He then had POBA of reocclusion of LAD stent in 6/10. Finally, he had sequential LIMA-D1 and D2 in 1/11.  NSTEMI in 6/17 (cocaine positive) showed 99% stenosis small distal LCx as likely culprit. This was medically managed. Echo in 6/17 showed EF 20% with wall motion abnormalities.   He was admitted in 12/17 with RUQ abdominal pain.  There was concern for acute cholecystitis based on CT of abdomen, but HIDA scan was negative. He left AMA.    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  UDS was positive for cocaine. CTA showed occlusion of left M2 segment of MCA.  He had tPA and mechanical thrombectomy.  He had complete recovery with resolution of neurologic symptoms.  Suspicion for cardio-embolic cause (LV thrombus), so warfarin was started. He was taken off of most of his cardiac meds due to low BP.   Pt presents today for pharmacist-led HF medication titration. At last HF clinic visit on 06/16/16, his aspirin was discontinued since has stable CAD and was having BRBPR. He was also advised to take his spironolactone in the evening since he was having some dizziness upon standing. He states that he cut down his smoking from 2 ppd to 1/2 packs per WEEK utilizing the nicotine patches. This is his second week only smoking 1/2 pack per week. He was congratulated on his success thus far.  Plans to pick up nicotine patches today. He also states that he has not been drinking any ETOH or doing any cocaine. His SOB is at baseline, still not sleeping well with 7 pillows and sometimes having PND. States that he had a sleep study about 3 years ago and was diagnosed with OSA but didn't have insurance to cover the mask. His  appetite has also continued to decline and is continuing to lose weight (was 220 before Thanksgiving last year). Hasn't taken any medications this morning because hasn't eaten yet. Still getting lightheaded every time he stands up.   . Shortness of breath/dyspnea on exertion? Yes - stable . Orthopnea/PND? Yes - stable sleeps with 7 pillows; some PND . Edema? No . Lightheadedness/dizziness? Yes . Daily weights at home? No - twin 43 yo boys broke his scale . Blood pressure/heart rate monitoring at home? Yes - usually SBP in 100-110 mmHg range  . Following low-sodium/fluid-restricted diet? No - drinks lots of sweet tea (1.5 gallons a day) but no salty/fried foods   HF Medications: Carvedilol 3.125 mg PO BID Furosemide 40 mg PO daily KCl 20 mEq PO daily Entresto 24-26 mg PO BID Spironolactone 12.5 mg PO daily   Has the patient been experiencing any side effects to the medications prescribed?  Yes - still having dizziness upon standing   Does the patient have any problems obtaining medications due to transportation or finances?   No - now has Medicare Part D insurance (will bring in at next visit)  Understanding of regimen: fair Understanding of indications: fair Potential of compliance: good Patient understands to avoid NSAIDs. Patient understands to avoid decongestants.    Pertinent Lab Values: . 07/09/16: Serum creatinine 1.2 (BL 1-1.2), BUN 11, Potassium 3.9, Sodium 140 . 06/16/16: BNP 337 . 05/23/16: Magnesium 2.0  Vital Signs: . Weight: 211 lb (last clinic visit wt: 217 lb) . Blood pressure: 116/74 mmHg (orthostatics: sitting 106/70 mmHg, standing 98/68 mmHg) . Heart rate: 70 bpm    Assessment: 1. Chronicsystolic CHF (EF 85-46%), due to ICM. NYHA class IIIsymptoms.  - Volume status stable. Continues to lose weight with decrease in appetite. Not orthostatic but BP remains soft  - Continue current regimen of carvedilol 3.125 mg BID, furosemide 40 mg daily, Entresto 24-26 mg BID,  KCl 20 mEq daily, and spironolactone 12.5 mg daily. No room to titrate with symptomatic low BP  - Katie to see today since she has not been able to contact him since her first home visit with him on 3/22 - Basic disease state pathophysiology, medication indication, mechanism and side effects reviewed at length with patient and he verbalized understanding 2. CAD: s/p CABG.  6/17 NSTEMI with subtotal occlusion small distal LCx managed medically  - No chest pain   - Increase atorvastatin to high potency 80 mg daily. LDL 78 on 05/22/2016  - Remains off ASA on Coumadin with stable CAD    - Continue BB, ARB, spiro  3. Active smoker  - Congratulated on decreasing from 2 ppd to 1/2 pack per week with nicotine patches  - Encouraged to quit smoking completely by next clinic visit 4. CVA: Possibly cardio-embolic, LV thrombus.    - Saw Dr. Leonie Man today  - Continue Coumadin for secondary stroke prevention  - No longer having BRBPR  - Recent INR's have been SUBtherapeutic. Stressed importance of compliance although he states he has not missed any doses and follows Coumadin Clinic recommendations (was able to tell me new dose from today's visit) 5. CKD Stage III  - SCr had been stable ~1, last SCr trend up to 1.34  - BMET today to reassess 6. Possible OSA  - Patient states he had a sleep study about 3 years ago and was diagnosed with OSA. He did not get fitted for mask since he did not have insurance at the time and would have been unaffordable for him  - Will refer again for sleep study   Plan: 1) Medication changes: Based on clinical presentation, vital signs and recent labs will increase atorvastatin to 80 mg daily  2) Labs: BMET today 3) Follow-up: Dr. Aundra Dubin 07/28/16    Ruta Hinds. Velva Harman, PharmD, BCPS, CPP Clinical Pharmacist Pager: [phone] Phone: [phone] 07/09/2016 10:31 AM

## 2016-07-09 NOTE — Patient Instructions (Signed)
I had a long d/w patient about his recent  embolic stroke, risk for recurrent stroke/TIAs, personally independently reviewed imaging studies and stroke evaluation results and answered questions.Continue warfarin daily  for secondary stroke prevention given his cardiomyopathy and low ejection fraction and maintain strict control of hypertension with blood pressure goal below 130/90, diabetes with hemoglobin A1c goal below 6.5% and lipids with LDL cholesterol goal below 70 mg/dL. I also advised the patient to eat a healthy diet with plenty of whole grains, cereals, fruits and vegetables, exercise regularly and maintain ideal body weight .he was counseled to be compliant with his medications and medical follow-up and was complimented on quitting smoking. Followup in the future with my nurse practitioner in 6 months or call earlier if necessary  Stroke Prevention Some medical conditions and behaviors are associated with an increased chance of having a stroke. You may prevent a stroke by making healthy choices and managing medical conditions. How can I reduce my risk of having a stroke?  Stay physically active. Get at least 30 minutes of activity on most or all days.  Do not smoke. It may also be helpful to avoid exposure to secondhand smoke.  Limit alcohol use. Moderate alcohol use is considered to be:  No more than 2 drinks per day for men.  No more than 1 drink per day for nonpregnant women.  Eat healthy foods. This involves:  Eating 5 or more servings of fruits and vegetables a day.  Making dietary changes that address high blood pressure (hypertension), high cholesterol, diabetes, or obesity.  Manage your cholesterol levels.  Making food choices that are high in fiber and low in saturated fat, trans fat, and cholesterol may control cholesterol levels.  Take any prescribed medicines to control cholesterol as directed by your health care provider.  Manage your diabetes.  Controlling your  carbohydrate and sugar intake is recommended to manage diabetes.  Take any prescribed medicines to control diabetes as directed by your health care provider.  Control your hypertension.  Making food choices that are low in salt (sodium), saturated fat, trans fat, and cholesterol is recommended to manage hypertension.  Ask your health care provider if you need treatment to lower your blood pressure. Take any prescribed medicines to control hypertension as directed by your health care provider.  If you are 70-50 years of age, have your blood pressure checked every 3-5 years. If you are 75 years of age or older, have your blood pressure checked every year.  Maintain a healthy weight.  Reducing calorie intake and making food choices that are low in sodium, saturated fat, trans fat, and cholesterol are recommended to manage weight.  Stop drug abuse.  Avoid taking birth control pills.  Talk to your health care provider about the risks of taking birth control pills if you are over 21 years old, smoke, get migraines, or have ever had a blood clot.  Get evaluated for sleep disorders (sleep apnea).  Talk to your health care provider about getting a sleep evaluation if you snore a lot or have excessive sleepiness.  Take medicines only as directed by your health care provider.  For some people, aspirin or blood thinners (anticoagulants) are helpful in reducing the risk of forming abnormal blood clots that can lead to stroke. If you have the irregular heart rhythm of atrial fibrillation, you should be on a blood thinner unless there is a good reason you cannot take them.  Understand all your medicine instructions.  Make sure that  other conditions (such as anemia or atherosclerosis) are addressed. Get help right away if:  You have sudden weakness or numbness of the face, arm, or leg, especially on one side of the body.  Your face or eyelid droops to one side.  You have sudden  confusion.  You have trouble speaking (aphasia) or understanding.  You have sudden trouble seeing in one or both eyes.  You have sudden trouble walking.  You have dizziness.  You have a loss of balance or coordination.  You have a sudden, severe headache with no known cause.  You have new chest pain or an irregular heartbeat. Any of these symptoms may represent a serious problem that is an emergency. Do not wait to see if the symptoms will go away. Get medical help at once. Call your local emergency services (911 in [place] Do not drive yourself to the hospital. This information is not intended to replace advice given to you by your health care provider. Make sure you discuss any questions you have with your health care provider. Document Released: 04/24/2004 Document Revised: 08/23/2015 Document Reviewed: 09/17/2012 Elsevier Interactive Patient Education  2017 [employer].

## 2016-07-09 NOTE — Progress Notes (Signed)
[facility] [address] [place]. [place] [address] [phone]       OFFICE FOLLOW-UP NOTE  John. John Davenport Date of Birth:  Jun 15, 1975 Medical Record Number:  [id number]   HPI: John Davenport is a 66 year Caucasian male seen today for the first office follow-up visit for hospital admission for stroke in February 2018. 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 Today 05/21/2016 he was in his usual state of health until about 41 (LKW) when he developed abrupt onset of right-sided weakness and difficulty speaking. His friend called 911 and EMS arrived to find the patient with expressive aphasia and dense weakness of the R arm and face with lesser involvement of the R leg. Symptoms improved during transport to the ED and he was able to move his R side and speak by the time they arrived at hospital. Dr. Shon Hale met the patient on his arrival to the ED with the rest of the stroke team. He had a mild R facial droop, some occasional word-finding difficulties, and mildly slurred speech for an NIHSS score of 3. Of note, the patient sat up on the gurney while the RN was putting him into a gown and he was observed to have clear worsening of both his aphasia and his facial droop. NHISS score: 3. CTH showed no acute abnormality. CTA of the head showed proximal occlusion of the L M2 segment. The case was reviewed with Dr. Estanislado Pandy, neurointerventional radiology, and it was felt that the patient would likely benefit from thrombectomy. After speaking with the patient and his mother (who is his POA), the decision was made to proceed with IV tPA followed by thrombectomy in IR. IV tPA was started at 1850, with 8 mg bolus followed by 79 mg infusion. He was taken to IR where Lt common carotid arteriogram  confirmed L M1 occlusion. He received TICI 3 revascularization with x 1 pass of the solitaire device . He was admitted to the intensive care unit where blood pressure was tightly controlled. He will did well on his exam and was extubated. He had no significant deficits on exam. After discussion with cardiology it was felt that a clot from his ischemic cardiomyopathy with the most likely etiology and it was decided to start him on anticoagulation with warfarin. Transthoracic echo showed ejection fraction of 15-20% which was declined from previous echo from 30% with severe dilatation of left ventricle. LDL cholesterol was 78 mg percent. Hemoglobin A1c was 5.4. He was seen by the therapist but was felt to have no therapy needs at the time of discharge. He had a previous history of defibrillator placement and MRI could not be obtained. Patient states his done well since discharge exam no recurrent stroke or TIA symptoms. His made for neurological recovery but does complain of decreased stamina and tiredness and increased sleepiness. He is tolerating warfarin well without bleeding or bruising but his INR has been difficult to control and last INR this week was 1.2. He is tolerating Lipitor well without muscle aches and pains. His blood pressure is usually quite good and today it is 106/64. He is on a nicotine patch and has almost quit smoking and is down from 2 pack per day now to half a pack per day and plans to quit next week. He has  no new complaints today.  ROS:   14 system review of systems is positive for  fatigue, tiredness, not enough sleep and all other systems negative  PMH:  Past Medical History:  Diagnosis Date  . Anxiety state, unspecified   . Back pain   . Cancer ([place])    Lung CA  . Chronic systolic heart failure ([place])    a. Ischemic CM (EF 30% by echo in 2011) b. echo 08/2015: EF 20% with severe HK and AK of the inferoseptal and apical myocardium.  . Coronary artery disease    a. s/p AMI  10/09 s/p BMS to LAD 12/2007 with subsequent POBA to LAD stent in 08/2008.  b. eventually required single vessel CABG (L-LAD) in 03/2009 due to restenosis. c. cath 2013 showed patent LIMA sequential to the diags with LAD filling retrograde, no new disease in RCA and Cx, low LVEDP, sx felt noncardiac. d. 08/2015: NSTEMI: CTO of prox LAD, 99% stenosis of small dCx (no stent), + for cocaine  . Esophageal reflux   . Heart attack   . Hypercholesteremia   . ICD (implantable cardioverter-defibrillator) infection ([place])    a. [place] [employer] ICD 12/2008.   . Ischemic cardiomyopathy    echocardiogram 12/11: Mild LVH, EF 30-35%, anteroseptal and apical akinesis, grade 1 diastolic dysfunction  . LBP (low back pain)   . OSA (obstructive sleep apnea) 12/30/2010  . Paroxysmal ventricular tachycardia ([place])   . Stroke ([place])   . Tobacco abuse     Social History:  Social History   Social History  . Marital status: Single    Spouse name: N/A  . Number of children: 1  . Years of education: N/A   Occupational History  . disabled Unemployed   Social History Main Topics  . Smoking status: Current Every Day Smoker    Packs/day: 0.50    Years: 20.00    Types: Cigarettes  . Smokeless tobacco: Former Systems developer    Types: Chew     Comment: smoke 0.5 per week  . Alcohol use No  . Drug use: No     Comment: REMOTE H/O SUBSTANCE ABUSE  . Sexual activity: Not on file   Other Topics Concern  . Not on file   Social History Narrative   Pt lives with his mom, brother, and 26 yo son in [place].   Quit tobacco in Jan. 2011 prior to heart surgery (prior -1/2 PPD (down from 1 ppd per day, onset age 80)    alcohol abuse- prior, hasn't drank since age 40   no illicit drug use    Currently unemployed, awaiting disability   Single          Medications:   Current Outpatient Prescriptions on File Prior to Visit  Medication Sig Dispense Refill  . atorvastatin (LIPITOR) 40 MG tablet Take 1 tablet (40 mg total) by  mouth daily. 30 tablet 2  . carvedilol (COREG) 3.125 MG tablet Take 1 tablet (3.125 mg total) by mouth 2 (two) times daily. 60 tablet 6  . furosemide (LASIX) 40 MG tablet Take 1 tablet (40 mg total) by mouth daily. 90 tablet 3  . nicotine (NICODERM CQ - DOSED IN MG/24 HOURS) 14 mg/24hr patch Place 1 patch (14 mg total) onto the skin daily. 28 patch 0  . nitroGLYCERIN (NITROSTAT) 0.4 MG SL tablet Place 1 tablet (0.4 mg total) under the tongue every 5 (five) minutes as needed for chest pain. 100 tablet 0  . potassium chloride SA (K-DUR,KLOR-CON)  20 MEQ tablet Take 1 tablet (20 mEq total) by mouth daily. 90 tablet 3  . sacubitril-valsartan (ENTRESTO) 24-26 MG Take 1 tablet by mouth 2 (two) times daily. 60 tablet 6  . spironolactone (ALDACTONE) 25 MG tablet Take 0.5 tablets (12.5 mg total) by mouth at bedtime. 45 tablet 3  . warfarin (COUMADIN) 7.5 MG tablet Take 7.5 mg (1 tablet) daily except 11.25 mg (1 and 1/2 tablet) on Tues/Thurs/Sat. 34 tablet 2   No current facility-administered medications on file prior to visit.     Allergies:  No Known Allergies  Physical Exam General: well developed, well nourished middle aged caucasian male, seated, in no evident distress Head: head normocephalic and atraumatic.  Neck: supple with no carotid or supraclavicular bruits Cardiovascular: regular rate and rhythm, no murmurs Musculoskeletal: no deformity Skin:  no rash/petichiae Vascular:  Normal pulses all extremities Vitals:   07/09/16 0900  BP: 106/64  Pulse: 76   Neurologic Exam Mental Status: Awake and fully alert. Oriented to place and time. Recent and remote memory intact. Attention span, concentration and fund of knowledge appropriate. Mood and affect appropriate.  Cranial Nerves: Fundoscopic exam reveals sharp disc margins. Pupils equal, briskly reactive to light. Extraocular movements full without nystagmus. Visual fields full to confrontation. Hearing intact. Facial sensation intact. Face,  tongue, palate moves normally and symmetrically.  Motor: Normal bulk and tone. Normal strength in all tested extremity muscles. Sensory.: intact to touch ,pinprick .position and vibratory sensation.  Coordination: Rapid alternating movements normal in all extremities. Finger-to-nose and heel-to-shin performed accurately bilaterally. Gait and Station: Arises from chair without difficulty. Stance is normal. Gait demonstrates normal stride length and balance . Able to heel, toe and tandem walk without difficulty.  Reflexes: 1+ and symmetric. Toes downgoing.   NIHSS  0 Modified Rankin 1   ASSESSMENT: 45 year Caucasian male with a cardioembolic left MCA branch infarct in February 2018 secondary to ischemic cardiomyopathy treated with IV TPA and mechanical embolectomy with complete recanalization of the left middle cerebral artery with excellent clinical outcome. Vascular risk factors of ischemic cardiomyopathy, coronary artery disease, smoking and hyperlipidemia    PLAN: I had a long d/w patient about his recent  embolic stroke, risk for recurrent stroke/TIAs, personally independently reviewed imaging studies and stroke evaluation results and answered questions.Continue warfarin daily  for secondary stroke prevention given his cardiomyopathy and low ejection fraction and maintain strict control of hypertension with blood pressure goal below 130/90, diabetes with hemoglobin A1c goal below 6.5% and lipids with LDL cholesterol goal below 70 mg/dL. I also advised the patient to eat a healthy diet with plenty of whole grains, cereals, fruits and vegetables, exercise regularly and maintain ideal body weight .he was counseled to be compliant with his medications and medical follow-up and was complimented on quitting smoking. Followup in the future with my nurse practitioner in 6 months or call earlier if necessary Greater than 50% of time during this 25 minute visit was spent on counseling,explanation of  diagnosis of embolic stroke, planning of further management, discussion with patient and  answering questions.  Antony Contras, MD  [facility] [address] [place], [place] [address]  Phone [phone] Fax [phone] Note: This document was prepared with digital dictation and possible smart phrase technology. Any transcriptional errors that result from this process are unintentional

## 2016-07-09 NOTE — Patient Instructions (Addendum)
It was great to see you today!  Great job cutting back the cigarettes! Keep up the good work!  Please INCREASE your atorvastatin to 80 mg DAILY.   Will refer you for a sleep study.   Labs today. We will call you with any issues.   Please keep your appointment with Dr. Aundra Dubin on 07/28/16.

## 2016-07-16 ENCOUNTER — Ambulatory Visit (INDEPENDENT_AMBULATORY_CARE_PROVIDER_SITE_OTHER): Payer: Medicare Other | Admitting: *Deleted

## 2016-07-16 DIAGNOSIS — Z5181 Encounter for therapeutic drug level monitoring: Secondary | ICD-10-CM

## 2016-07-16 DIAGNOSIS — I255 Ischemic cardiomyopathy: Secondary | ICD-10-CM

## 2016-07-16 DIAGNOSIS — I6340 Cerebral infarction due to embolism of unspecified cerebral artery: Secondary | ICD-10-CM | POA: Diagnosis not present

## 2016-07-16 LAB — POCT INR: INR: 1.1

## 2016-07-23 ENCOUNTER — Encounter (INDEPENDENT_AMBULATORY_CARE_PROVIDER_SITE_OTHER): Payer: Self-pay

## 2016-07-23 ENCOUNTER — Ambulatory Visit (INDEPENDENT_AMBULATORY_CARE_PROVIDER_SITE_OTHER): Payer: Medicare Other | Admitting: *Deleted

## 2016-07-23 DIAGNOSIS — I6340 Cerebral infarction due to embolism of unspecified cerebral artery: Secondary | ICD-10-CM | POA: Diagnosis not present

## 2016-07-23 DIAGNOSIS — Z5181 Encounter for therapeutic drug level monitoring: Secondary | ICD-10-CM | POA: Diagnosis not present

## 2016-07-23 DIAGNOSIS — I255 Ischemic cardiomyopathy: Secondary | ICD-10-CM | POA: Diagnosis not present

## 2016-07-23 LAB — POCT INR: INR: 1.1

## 2016-07-28 ENCOUNTER — Encounter (HOSPITAL_COMMUNITY): Payer: Self-pay

## 2016-07-28 ENCOUNTER — Other Ambulatory Visit (HOSPITAL_COMMUNITY): Payer: Self-pay

## 2016-07-28 ENCOUNTER — Ambulatory Visit (HOSPITAL_COMMUNITY)
Admission: RE | Admit: 2016-07-28 | Discharge: 2016-07-28 | Disposition: A | Discharge status: Home / Self Care | Payer: Medicare Other | Source: Ambulatory Visit | Attending: Cardiology | Admitting: Cardiology

## 2016-07-28 ENCOUNTER — Ambulatory Visit (INDEPENDENT_AMBULATORY_CARE_PROVIDER_SITE_OTHER): Payer: Medicare Other | Admitting: *Deleted

## 2016-07-28 VITALS — BP 108/60 | HR 82 | Wt 213.5 lb

## 2016-07-28 DIAGNOSIS — Z951 Presence of aortocoronary bypass graft: Secondary | ICD-10-CM | POA: Insufficient documentation

## 2016-07-28 DIAGNOSIS — F17210 Nicotine dependence, cigarettes, uncomplicated: Secondary | ICD-10-CM | POA: Insufficient documentation

## 2016-07-28 DIAGNOSIS — Z7901 Long term (current) use of anticoagulants: Secondary | ICD-10-CM | POA: Insufficient documentation

## 2016-07-28 DIAGNOSIS — K219 Gastro-esophageal reflux disease without esophagitis: Secondary | ICD-10-CM | POA: Diagnosis not present

## 2016-07-28 DIAGNOSIS — I2510 Atherosclerotic heart disease of native coronary artery without angina pectoris: Secondary | ICD-10-CM

## 2016-07-28 DIAGNOSIS — I214 Non-ST elevation (NSTEMI) myocardial infarction: Secondary | ICD-10-CM | POA: Diagnosis not present

## 2016-07-28 DIAGNOSIS — I5022 Chronic systolic (congestive) heart failure: Secondary | ICD-10-CM | POA: Diagnosis not present

## 2016-07-28 DIAGNOSIS — Z8673 Personal history of transient ischemic attack (TIA), and cerebral infarction without residual deficits: Secondary | ICD-10-CM | POA: Insufficient documentation

## 2016-07-28 DIAGNOSIS — G4733 Obstructive sleep apnea (adult) (pediatric): Secondary | ICD-10-CM | POA: Diagnosis not present

## 2016-07-28 DIAGNOSIS — I6340 Cerebral infarction due to embolism of unspecified cerebral artery: Secondary | ICD-10-CM

## 2016-07-28 DIAGNOSIS — Z720 Tobacco use: Secondary | ICD-10-CM | POA: Diagnosis not present

## 2016-07-28 DIAGNOSIS — Z5181 Encounter for therapeutic drug level monitoring: Secondary | ICD-10-CM | POA: Diagnosis not present

## 2016-07-28 DIAGNOSIS — I255 Ischemic cardiomyopathy: Secondary | ICD-10-CM

## 2016-07-28 DIAGNOSIS — I5042 Chronic combined systolic (congestive) and diastolic (congestive) heart failure: Secondary | ICD-10-CM

## 2016-07-28 DIAGNOSIS — Z955 Presence of coronary angioplasty implant and graft: Secondary | ICD-10-CM | POA: Diagnosis not present

## 2016-07-28 DIAGNOSIS — I252 Old myocardial infarction: Secondary | ICD-10-CM | POA: Insufficient documentation

## 2016-07-28 DIAGNOSIS — Z8249 Family history of ischemic heart disease and other diseases of the circulatory system: Secondary | ICD-10-CM | POA: Diagnosis not present

## 2016-07-28 DIAGNOSIS — J449 Chronic obstructive pulmonary disease, unspecified: Secondary | ICD-10-CM | POA: Insufficient documentation

## 2016-07-28 LAB — CBC
HEMATOCRIT: 44.7 % (ref 39.0–52.0)
Hemoglobin: 14.7 g/dL (ref 13.0–17.0)
MCH: 29.8 pg (ref 26.0–34.0)
MCHC: 32.9 g/dL (ref 30.0–36.0)
MCV: 90.7 fL (ref 78.0–100.0)
Platelets: 189 10*3/uL (ref 150–400)
RBC: 4.93 MIL/uL (ref 4.22–5.81)
RDW: 14.3 % (ref 11.5–15.5)
WBC: 11.1 10*3/uL — ABNORMAL HIGH (ref 4.0–10.5)

## 2016-07-28 LAB — LIPID PANEL
CHOL/HDL RATIO: 3.6 ratio
Cholesterol: 145 mg/dL (ref 0–200)
HDL: 40 mg/dL — ABNORMAL LOW (ref >40)
LDL Cholesterol: 91 mg/dL (ref 0–99)
Triglycerides: 70 mg/dL (ref <150)
VLDL: 14 mg/dL (ref 0–40)

## 2016-07-28 LAB — BASIC METABOLIC PANEL
ANION GAP: 7 (ref 5–15)
BUN: 18 mg/dL (ref 6–20)
CALCIUM: 8.9 mg/dL (ref 8.9–10.3)
CO2: 23 mmol/L (ref 22–32)
Chloride: 110 mmol/L (ref 101–111)
Creatinine, Ser: 1.3 mg/dL — ABNORMAL HIGH (ref 0.61–1.24)
GFR calc Af Amer: >60 mL/min (ref >60)
GFR calc non Af Amer: >60 mL/min (ref >60)
GLUCOSE: 111 mg/dL — AB (ref 65–99)
Potassium: 3.3 mmol/L — ABNORMAL LOW (ref 3.5–5.1)
Sodium: 140 mmol/L (ref 135–145)

## 2016-07-28 LAB — POCT INR: INR: 1.1

## 2016-07-28 MED ORDER — SPIRONOLACTONE 25 MG PO TABS: 25.0000 mg | ORAL_TABLET | Freq: Every day | ORAL | 3 refills | Status: DC

## 2016-07-28 NOTE — Patient Instructions (Signed)
Labs today (will call for abnormal results, otherwise no news is good news)  INCREASE Spironolactone to 25 mg (1 Tablet) Once Daily at bedtime  Labs in 10 days (BMET)  Follow up in 1 Month
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
## 2016-07-28 NOTE — Progress Notes (Signed)
Cardiology: Dr. Aundra Dubin  41 yo with history of CAD s/p CABG, chronic systolic CHF (ischemic cardiomyopathy), and COPD presents for CHF clinic evaluation.  He had his first MI with occlusion of LAD in 10/9 treated with BMS.  He then had POBA of reocclusion of LAD stent in 6/10. Finally, he had sequential LIMA-D1 and D2 in 1/11.  NSTEMI in 6/17 (cocaine positive) showed 99% stenosis small distal LCx as likely culprit. This was medically managed. Echo in 6/17 showed EF 20% with wall motion abnormalities.   He was admitted in 12/17 with RUQ abdominal pain.  There was concern for acute cholecystitis based on CT of abdomen, but HIDA scan was negative. He left AMA.    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  UDS was positive for cocaine. CTA showed occlusion of left M2 segment of MCA.  He had tPA and mechanical thrombectomy.  He had complete recovery with resolution of neurologic symptoms.  Suspicion for cardio-embolic cause (LV thrombus), so warfarin was started. He was taken off of most of his cardiac meds due to low BP, these have subsequently been restarted.   He has been having a hard time recently, girlfriend died in a car accident and he has no one to help him take care of his young twins.  No chest pain.  Down to 1/2 pack cigarettes every 2 wks.   No residual neurological abnormalities from CVA.  Walks down his street and back, gets short of breath walking up a hill.  Generally ok on flat ground.  Dizzy if he stands too fast.  No orthopnea/PND.  Weight down 4 lbs. No BRBPR/melena.    ECG (12/17): NSR, IVCD 139 msec  Labs (6/17): K 3.8, creatinine 1.26, HCT 42.4, LDL 81, HDL 34  Labs (10/17): K 4, creatinine 1.42, LDL 122, BNP 475 Labs (12/17): K 4, creatinine 1.39, hgb 13.6 Labs (2/18): K 4.2, creatinine 1.17, LDL 78, hgb 15.3 Labs (4/18): K 3.9, creatinine 1.2  PMH: 1. GERD 2. OSA 3. H/o VT 4. COPD: Active smoker. 5. Chronic systolic CHF: Ischemic  cardiomyopathy. Has [employer] ICD.  - Echo (6/17): EF 20%, wall motion abnormalities, mild MR - CPX (11/17): peak VO2 19.7 (51% predicted), VE/VCO2 slope 46.9, RER 0.92 => Submaximal, but probably moderate to severe functional impairment => suspect restrictive/obstructive lung disease and HF play a role in the abnormality, probably moderate impairment in terms of HF.  - Echo (2/18): EF 15-20%, severe LV dilation, mildly decreased RV systolic function, PASP 45 mmHg.  6. CAD: Acute MI in 10/09 with BMS to LAD - POBA to LAD stent in 6/10.  - CABG with sequential LIMA-D1 and D2 in 1/11. - NSTEMI 6/17: LHC with patent sequential LIMA-D1 and D2 with filling of LAD, occlusion proximal LAD, subtotal occlusion of distal LCx into small OM (likely culprit).  No PCI. He was cocaine positive.  7. Symptomatic cholelithiasis: Negative HIDA scan 12/17.  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  Thought to be due to cardio-embolism (LV thrombus).   SH: Disabled, active smoker (down to 1/2 pack every 2 wks), history of cocaine use, no ETOH, lives in [place].  FH: Father with 1st MI in his 35s.  ROS: All systems reviewed and negative except as per HPI.   Current Outpatient Prescriptions  Medication Sig Dispense Refill  . atorvastatin (LIPITOR) 80 MG tablet Take 1 tablet (80 mg total) by mouth daily. [place]  tablet 11  . carvedilol (COREG) 3.125 MG tablet Take 1 tablet (3.125 mg total) by mouth 2 (two) times daily. 60 tablet 6  . furosemide (LASIX) 40 MG tablet Take 1 tablet (40 mg total) by mouth daily. 90 tablet 3  . nicotine (NICODERM CQ - DOSED IN MG/24 HOURS) 14 mg/24hr patch Place 1 patch (14 mg total) onto the skin daily. 28 patch 0  . potassium chloride SA (K-DUR,KLOR-CON) 20 MEQ tablet Take 1 tablet (20 mEq total) by mouth daily. 90 tablet 3  . sacubitril-valsartan (ENTRESTO) 24-26 MG Take 1 tablet by mouth 2 (two) times daily. 60 tablet 6  .  spironolactone (ALDACTONE) 25 MG tablet Take 1 tablet (25 mg total) by mouth at bedtime. 30 tablet 3  . warfarin (COUMADIN) 7.5 MG tablet Take 11.25 mg by mouth daily.    . nitroGLYCERIN (NITROSTAT) 0.4 MG SL tablet Place 1 tablet (0.4 mg total) under the tongue every 5 (five) minutes as needed for chest pain. (Patient not taking: Reported on 07/09/2016) 100 tablet 0   No current facility-administered medications for this encounter.    BP 108/60   Pulse 82   Wt 213 lb 8 oz (96.8 kg)   SpO2 100%   BMI 26.69 kg/m  General: NAD. Walked in the clinic.   Neck: JVP 7 cm, no thyromegaly or thyroid nodule.  Lungs: CTAB.  CV: Nondisplaced PMI.  Regular S1/S2, no S3, no murmur.  No lower extremity edema. No carotid bruit.   Abdomen: Soft, no hepatosplenomegaly, no distention.  Nontender.   Skin: Intact without lesions or rashes.  Neurologic: [place] and oriented x3  Psych: Normal affect. Extremities: No clubbing or cyanosis. Warm HEENT: Normal.   Assessment/Plan: 1. CAD: s/p CABG.  6/17 NSTEMI with subtotal occlusion small distal LCx managed medically.  No chest pain.  - Continue ASA 81.   - Continue statin. He is on atorvastatin, good lipids in 2/18.   2. Chronic systolic CHF: Ischemic CMP.  [place].  EF 15-20% on 2/18 echo.  COPD likely also plays a role in his dyspnea. Submaximal CPX in 11/17 but suggestive of both heart and lung limitation => appears to have moderate limitation in terms of heart failure. NYHA class III symptoms.  He does not, however, appear particularly volume overloaded.  - Increase spironolactone to 25 mg daily. BMET today and again in 10 days.  - Continue Coreg 3.125 mg bid and Entresto 24/26 bid.  - Continue Lasix 40 daily + KCl 20 daily.   3. Active smoker: Cutting back, down to 1/2 pack every 2 wks.  4. CVA: Possibly cardio-embolic, LV thrombus.  He is on warfarin and is compliant with coumadin clinic followup now.  Followup 1 month.  Loralie Champagne 07/28/2016
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
## 2016-08-03 ENCOUNTER — Ambulatory Visit (HOSPITAL_BASED_OUTPATIENT_CLINIC_OR_DEPARTMENT_OTHER): Payer: Medicare Other | Attending: Cardiology

## 2016-08-04 ENCOUNTER — Ambulatory Visit (INDEPENDENT_AMBULATORY_CARE_PROVIDER_SITE_OTHER): Payer: Medicare Other | Admitting: *Deleted

## 2016-08-04 DIAGNOSIS — Z5181 Encounter for therapeutic drug level monitoring: Secondary | ICD-10-CM

## 2016-08-04 DIAGNOSIS — I255 Ischemic cardiomyopathy: Secondary | ICD-10-CM

## 2016-08-04 DIAGNOSIS — I6340 Cerebral infarction due to embolism of unspecified cerebral artery: Secondary | ICD-10-CM

## 2016-08-04 LAB — POCT INR: INR: 1.1

## 2016-08-08 ENCOUNTER — Encounter (HOSPITAL_COMMUNITY): Payer: Self-pay | Admitting: *Deleted

## 2016-08-11 ENCOUNTER — Ambulatory Visit (INDEPENDENT_AMBULATORY_CARE_PROVIDER_SITE_OTHER): Payer: Medicare Other | Admitting: *Deleted

## 2016-08-11 ENCOUNTER — Ambulatory Visit (HOSPITAL_COMMUNITY)
Admission: RE | Admit: 2016-08-11 | Discharge: 2016-08-11 | Disposition: A | Discharge status: Home / Self Care | Payer: Medicare Other | Source: Ambulatory Visit | Attending: Cardiology | Admitting: Cardiology

## 2016-08-11 DIAGNOSIS — Z5181 Encounter for therapeutic drug level monitoring: Secondary | ICD-10-CM | POA: Diagnosis not present

## 2016-08-11 DIAGNOSIS — I5042 Chronic combined systolic (congestive) and diastolic (congestive) heart failure: Secondary | ICD-10-CM | POA: Insufficient documentation

## 2016-08-11 DIAGNOSIS — I255 Ischemic cardiomyopathy: Secondary | ICD-10-CM

## 2016-08-11 DIAGNOSIS — I6340 Cerebral infarction due to embolism of unspecified cerebral artery: Secondary | ICD-10-CM | POA: Diagnosis not present

## 2016-08-11 LAB — BASIC METABOLIC PANEL
Anion gap: 9 (ref 5–15)
BUN: 15 mg/dL (ref 6–20)
CALCIUM: 9 mg/dL (ref 8.9–10.3)
CHLORIDE: 105 mmol/L (ref 101–111)
CO2: 25 mmol/L (ref 22–32)
CREATININE: 1.27 mg/dL — AB (ref 0.61–1.24)
Glucose, Bld: 96 mg/dL (ref 65–99)
Potassium: 3.9 mmol/L (ref 3.5–5.1)
SODIUM: 139 mmol/L (ref 135–145)

## 2016-08-11 LAB — POCT INR: INR: 1

## 2016-08-27 ENCOUNTER — Encounter (HOSPITAL_COMMUNITY): Payer: Self-pay

## 2016-08-27 ENCOUNTER — Ambulatory Visit (HOSPITAL_COMMUNITY)
Admission: RE | Admit: 2016-08-27 | Discharge: 2016-08-27 | Disposition: A | Discharge status: Home / Self Care | Payer: Medicare Other | Source: Ambulatory Visit | Attending: Cardiology | Admitting: Cardiology

## 2016-08-27 ENCOUNTER — Other Ambulatory Visit (HOSPITAL_COMMUNITY): Payer: Self-pay

## 2016-08-27 VITALS — BP 118/74 | HR 71 | Wt 208.4 lb

## 2016-08-27 DIAGNOSIS — F17210 Nicotine dependence, cigarettes, uncomplicated: Secondary | ICD-10-CM | POA: Diagnosis not present

## 2016-08-27 DIAGNOSIS — K219 Gastro-esophageal reflux disease without esophagitis: Secondary | ICD-10-CM | POA: Insufficient documentation

## 2016-08-27 DIAGNOSIS — I252 Old myocardial infarction: Secondary | ICD-10-CM | POA: Insufficient documentation

## 2016-08-27 DIAGNOSIS — I255 Ischemic cardiomyopathy: Secondary | ICD-10-CM | POA: Insufficient documentation

## 2016-08-27 DIAGNOSIS — I2510 Atherosclerotic heart disease of native coronary artery without angina pectoris: Secondary | ICD-10-CM | POA: Insufficient documentation

## 2016-08-27 DIAGNOSIS — I5022 Chronic systolic (congestive) heart failure: Secondary | ICD-10-CM | POA: Diagnosis not present

## 2016-08-27 DIAGNOSIS — Z720 Tobacco use: Secondary | ICD-10-CM

## 2016-08-27 DIAGNOSIS — I5042 Chronic combined systolic (congestive) and diastolic (congestive) heart failure: Secondary | ICD-10-CM

## 2016-08-27 DIAGNOSIS — Z8673 Personal history of transient ischemic attack (TIA), and cerebral infarction without residual deficits: Secondary | ICD-10-CM | POA: Insufficient documentation

## 2016-08-27 DIAGNOSIS — Z951 Presence of aortocoronary bypass graft: Secondary | ICD-10-CM | POA: Diagnosis not present

## 2016-08-27 DIAGNOSIS — Z7901 Long term (current) use of anticoagulants: Secondary | ICD-10-CM | POA: Diagnosis not present

## 2016-08-27 DIAGNOSIS — G4733 Obstructive sleep apnea (adult) (pediatric): Secondary | ICD-10-CM | POA: Insufficient documentation

## 2016-08-27 DIAGNOSIS — J449 Chronic obstructive pulmonary disease, unspecified: Secondary | ICD-10-CM | POA: Diagnosis not present

## 2016-08-27 MED ORDER — CARVEDILOL 6.25 MG PO TABS: 6.2500 mg | ORAL_TABLET | Freq: Two times a day (BID) | ORAL | 6 refills | Status: DC

## 2016-08-27 NOTE — Progress Notes (Signed)
John Davenport had a conversation with [name] and he advised her that he has a lot going on right now.  He doesn't have time with CHP right now, therefore he will be removed from the program ufn.

## 2016-08-27 NOTE — Patient Instructions (Signed)
INCREASE Carvedilol (Coreg) to 6.25 mg twice daily. May "double up" on your current 3.125 mg tablets you have at home (Take 2 tabs twice daily). New Rx has been sent to your pharmacy for 6.25 mg tablets (Take 1 tab twice daily).  STOP Atorvastatin.  START Crestor 40 mg tablet once daily.  Return in 2 months for lab work.  Follow up in 1 month.  Do the following things EVERYDAY: 1) Weigh yourself in the morning before breakfast. Write it down and keep it in a log. 2) Take your medicines as prescribed 3) Eat low salt foods-Limit salt (sodium) to 2000 mg per day.  4) Stay as active as you can everyday 5) Limit all fluids for the day to less than 2 liters

## 2016-08-27 NOTE — Progress Notes (Signed)
I spoke with [name] during his clinic appt.   He tells me that he has "a lot going on" and he feels that he does not need or have time for the HF Paramedicine home visits.  He feels it is another added stressor in his life and wishes to be removed from the program.  I will remove him and have also advised him to contact myself or Raquel Sarna --clinic LCSW with any additional needs or questions.

## 2016-08-28 NOTE — Progress Notes (Signed)
Cardiology: Dr. Aundra Dubin  41 yo with history of CAD s/p CABG, chronic systolic CHF (ischemic cardiomyopathy), and COPD presents for CHF clinic evaluation.  He had his first MI with occlusion of LAD in 10/9 treated with BMS.  He then had POBA of reocclusion of LAD stent in 6/10. Finally, he had sequential LIMA-D1 and D2 in 1/11.  NSTEMI in 6/17 (cocaine positive) showed 99% stenosis small distal LCx as likely culprit. This was medically managed. Echo in 6/17 showed EF 20% with wall motion abnormalities.   He was admitted in 12/17 with RUQ abdominal pain.  There was concern for acute cholecystitis based on CT of abdomen, but HIDA scan was negative. He left AMA.    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  UDS was positive for cocaine. CTA showed occlusion of left M2 segment of MCA.  He had tPA and mechanical thrombectomy.  He had complete recovery with resolution of neurologic symptoms.  Suspicion for cardio-embolic cause (LV thrombus), so warfarin was started. He was taken off of most of his cardiac meds due to low BP, these have subsequently been restarted.   He has cut back a lot on cigarettes but still smoking some.  No residual neurological abnormalities from CVA.  Walks down his street and back, gets short of breath walking up a hill.  Generally ok on flat ground.  Sleeps on several pillows chronically.  Weight down 5 lbs. No BRBPR/melena.    ECG (12/17): NSR, IVCD 139 msec  Labs (6/17): K 3.8, creatinine 1.26, HCT 42.4, LDL 81, HDL 34  Labs (10/17): K 4, creatinine 1.42, LDL 122, BNP 475 Labs (12/17): K 4, creatinine 1.39, hgb 13.6 Labs (2/18): K 4.2, creatinine 1.17, LDL 78, hgb 15.3 Labs (4/18): K 3.9, creatinine 1.2, LDL 91, HDL 40 Labs (5/18): K 3.9, creatinine 1.27  PMH: 1. GERD 2. OSA 3. H/o VT 4. COPD: Active smoker. 5. Chronic systolic CHF: Ischemic cardiomyopathy. Has [employer] ICD.  - Echo (6/17): EF 20%, wall motion abnormalities, mild  MR - CPX (11/17): peak VO2 19.7 (51% predicted), VE/VCO2 slope 46.9, RER 0.92 => Submaximal, but probably moderate to severe functional impairment => suspect restrictive/obstructive lung disease and HF play a role in the abnormality, probably moderate impairment in terms of HF.  - Echo (2/18): EF 15-20%, severe LV dilation, mildly decreased RV systolic function, PASP 45 mmHg.  6. CAD: Acute MI in 10/09 with BMS to LAD - POBA to LAD stent in 6/10.  - CABG with sequential LIMA-D1 and D2 in 1/11. - NSTEMI 6/17: LHC with patent sequential LIMA-D1 and D2 with filling of LAD, occlusion proximal LAD, subtotal occlusion of distal LCx into small OM (likely culprit).  No PCI. He was cocaine positive.  7. Symptomatic cholelithiasis: Negative HIDA scan 12/17.  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  Thought to be due to cardio-embolism (LV thrombus).   SH: Disabled, active smoker (down to 1/2 pack every 2 wks), history of cocaine use, no ETOH, lives in [place].  FH: Father with 1st MI in his 34s.  ROS: All systems reviewed and negative except as per HPI.   Current Outpatient Prescriptions  Medication Sig Dispense Refill  . carvedilol (COREG) 6.25 MG tablet Take 1 tablet (6.25 mg total) by mouth 2 (two) times daily. 60 tablet 6  . furosemide (LASIX) 40 MG tablet Take 1 tablet (40 mg total) by mouth daily. 90 tablet 3  .  nicotine (NICODERM CQ - DOSED IN MG/24 HOURS) 14 mg/24hr patch Place 1 patch (14 mg total) onto the skin daily. 28 patch 0  . nitroGLYCERIN (NITROSTAT) 0.4 MG SL tablet Place 1 tablet (0.4 mg total) under the tongue every 5 (five) minutes as needed for chest pain. 100 tablet 0  . potassium chloride SA (K-DUR,KLOR-CON) 20 MEQ tablet Take 1 tablet (20 mEq total) by mouth daily. 90 tablet 3  . sacubitril-valsartan (ENTRESTO) 24-26 MG Take 1 tablet by mouth 2 (two) times daily. 60 tablet 6  . spironolactone (ALDACTONE) 25 MG tablet Take  1 tablet (25 mg total) by mouth at bedtime. 30 tablet 3  . warfarin (COUMADIN) 7.5 MG tablet Take 11.25 mg by mouth daily.     No current facility-administered medications for this encounter.    BP 118/74   Pulse 71   Wt 208 lb 6.4 oz (94.5 kg)   SpO2 100%   BMI 26.05 kg/m  General: NAD.  Neck: No JVD, no thyromegaly or thyroid nodule.  Lungs: Clear to auscultation bilaterally.  CV: Nondisplaced PMI.  Regular S1/S2, no S3, no murmur.  No lower extremity edema. No carotid bruit.   Abdomen: Soft, no hepatosplenomegaly, no distention.  Nontender.   Skin: Intact without lesions or rashes.  Neurologic: Alet and oriented x3  Psych: Normal affect. Extremities: No clubbing or cyanosis. Warm HEENT: Normal.   Assessment/Plan: 1. CAD: s/p CABG.  6/17 NSTEMI with subtotal occlusion small distal LCx managed medically.  No chest pain.  - Continue ASA 81.   - Continue statin, but given LDL > 70, I will stop atorvastatin and start him on Crestor 40 mg daily with lipids/LFTs in 2 months.    2. Chronic systolic CHF: Ischemic CMP.  [place].  EF 15-20% on 2/18 echo.  COPD likely also plays a role in his dyspnea. Submaximal CPX in 11/17 but suggestive of both heart and lung limitation => appears to have moderate limitation in terms of heart failure. NYHA class III symptoms.  He does not appear volume overloaded on exam.  - Continue spironolactone 25 mg daily.  - Continue Entresto 24/26 bid.  - Increase Coreg to 6.25 mg bid.  - Continue Lasix 40 daily + KCl 20 daily.   3. Active smoker: Cutting back, says he has almost quit.  4. CVA: Possibly cardio-embolic, LV thrombus.  He is on warfarin and is compliant with coumadin clinic followup now.  Followup 1 month with NP/PA.  Loralie Champagne 08/28/2016

## 2016-09-01 ENCOUNTER — Other Ambulatory Visit: Payer: Self-pay

## 2016-09-02 ENCOUNTER — Other Ambulatory Visit: Payer: Self-pay

## 2016-09-03 ENCOUNTER — Other Ambulatory Visit: Payer: Self-pay

## 2016-09-03 NOTE — Progress Notes (Signed)
This encounter was created in error - please disregard.  This encounter was created in error - please disregard.

## 2016-09-04 NOTE — Patient Outreach (Signed)
3 telephone outreach attempts were completed to obtain mRs for patient.  mRs could not be obtained because messages left for patient requesting a return phone call but no return call received.

## 2016-09-24 ENCOUNTER — Telehealth: Payer: Self-pay | Admitting: Cardiology

## 2016-09-24 ENCOUNTER — Encounter: Payer: Medicare Other | Admitting: *Deleted

## 2016-09-24 NOTE — Telephone Encounter (Signed)
LMOVM reminding pt to send remote transmission.   

## 2016-09-26 ENCOUNTER — Encounter (HOSPITAL_COMMUNITY): Payer: Medicare Other

## 2016-10-27 ENCOUNTER — Inpatient Hospital Stay (HOSPITAL_COMMUNITY): Admission: RE | Admit: 2016-10-27 | Payer: Medicare Other | Source: Ambulatory Visit

## 2016-10-28 ENCOUNTER — Ambulatory Visit (HOSPITAL_COMMUNITY)
Admission: RE | Admit: 2016-10-28 | Discharge: 2016-10-28 | Disposition: A | Discharge status: Home / Self Care | Payer: Medicare Other | Source: Ambulatory Visit | Attending: Cardiology | Admitting: Cardiology

## 2016-10-28 DIAGNOSIS — I5042 Chronic combined systolic (congestive) and diastolic (congestive) heart failure: Secondary | ICD-10-CM

## 2016-10-28 LAB — HEPATIC FUNCTION PANEL
ALT: 17 U/L (ref 17–63)
AST: 23 U/L (ref 15–41)
Albumin: 3.6 g/dL (ref 3.5–5.0)
Alkaline Phosphatase: 54 U/L (ref 38–126)
BILIRUBIN TOTAL: 0.4 mg/dL (ref 0.3–1.2)
Total Protein: 6.7 g/dL (ref 6.5–8.1)

## 2016-10-28 LAB — LIPID PANEL
Cholesterol: 129 mg/dL (ref 0–200)
HDL: 30 mg/dL — ABNORMAL LOW (ref >40)
LDL CALC: 61 mg/dL (ref 0–99)
Total CHOL/HDL Ratio: 4.3 RATIO
Triglycerides: 190 mg/dL — ABNORMAL HIGH (ref <150)
VLDL: 38 mg/dL (ref 0–40)

## 2017-01-07 NOTE — Progress Notes (Deleted)
[facility]  PATIENT: John Davenport DOB: 07-Apr-1975   REASON FOR VISIT: Follow-up for left branch MCA infarct HISTORY FROM:    HISTORY OF PRESENT ILLNESS:   07/09/16 PSMr Maclay is a 41 year Caucasian male seen today for the first office follow-up visit for hospital admission for stroke in February 2018. 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 Today 2/21/2018he was in his usual state of health until about 1700 (LKW) when he developed abrupt onset of right-sided weakness and difficulty speaking. His friend called 911 and EMS arrived to find the patient with expressive aphasia and dense weakness of the R arm and face with lesser involvement of the R leg. Symptoms improved during transport to the ED and he was able to move his R side and speak by the time they arrived at hospital. Dr. Shon Hale met the patient on his arrival to the ED with the rest of the stroke team. He had a mild R facial droop, some occasional word-finding difficulties, and mildly slurred speech for an NIHSS score of 3. Of note, the patient sat up on the gurney while the RN was putting him into a gown and he was observed to have clear worsening of both his aphasia and his facial droop. NHISS score: 3. CTH showed no acute abnormality. CTA of the head showed proximal occlusion of the L M2 segment. The case was reviewed with Dr. Estanislado Pandy, neurointerventional radiology, and it was felt that the patient would likely benefit from thrombectomy. After speaking with the patient and his mother (who is his POA), the decision was made to proceed with IV tPA followed by thrombectomy in IR. IV tPA was started at 1850, with 8 mg bolus followed by 79 mg infusion. He was taken to IR where Lt common carotid arteriogram confirmed L M1  occlusion. He received TICI 3 revascularization with x 1 pass of the solitaire device . He was admitted to the intensive care unit where blood pressure was tightly controlled. He will did well on his exam and was extubated. He had no significant deficits on exam. After discussion with cardiology it was felt that a clot from his ischemic cardiomyopathy with the most likely etiology and it was decided to start him on anticoagulation with warfarin. Transthoracic echo showed ejection fraction of 15-20% which was declined from previous echo from 30% with severe dilatation of left ventricle. LDL cholesterol was 78 mg percent. Hemoglobin A1c was 5.4. He was seen by the therapist but was felt to have no therapy needs at the time of discharge. He had a previous history of defibrillator placement and MRI could not be obtained. Patient states his done well since discharge exam no recurrent stroke or TIA symptoms. His made for neurological recovery but does complain of decreased stamina and tiredness and increased sleepiness. He is tolerating warfarin well without bleeding or bruising but his INR has been difficult to control and last INR this week was 1.2. He is tolerating Lipitor well without muscle aches and pains. His blood pressure is usually quite good and today it is 106/64. He is on a nicotine patch and has almost quit smoking and is down from 2 pack per day now to half a pack per day and plans to quit next week. He has no new complaints today.  REVIEW OF SYSTEMS: Full 14 system review of systems performed and notable only for those listed, all others are neg:  Constitutional: neg  Cardiovascular: neg Ear/Nose/Throat: neg  Skin: neg Eyes: neg Respiratory: neg Gastroitestinal: neg  Hematology/Lymphatic: neg  Endocrine: neg Musculoskeletal:neg Allergy/Immunology: neg Neurological: neg Psychiatric: neg Sleep : neg   ALLERGIES: No Known Allergies  HOME MEDICATIONS: Outpatient Medications Prior to  Visit  Medication Sig Dispense Refill  . carvedilol (COREG) 6.25 MG tablet Take 1 tablet (6.25 mg total) by mouth 2 (two) times daily. 60 tablet 6  . furosemide (LASIX) 40 MG tablet Take 1 tablet (40 mg total) by mouth daily. 90 tablet 3  . nicotine (NICODERM CQ - DOSED IN MG/24 HOURS) 14 mg/24hr patch Place 1 patch (14 mg total) onto the skin daily. 28 patch 0  . nitroGLYCERIN (NITROSTAT) 0.4 MG SL tablet Place 1 tablet (0.4 mg total) under the tongue every 5 (five) minutes as needed for chest pain. 100 tablet 0  . potassium chloride SA (K-DUR,KLOR-CON) 20 MEQ tablet Take 1 tablet (20 mEq total) by mouth daily. 90 tablet 3  . sacubitril-valsartan (ENTRESTO) 24-26 MG Take 1 tablet by mouth 2 (two) times daily. 60 tablet 6  . spironolactone (ALDACTONE) 25 MG tablet Take 1 tablet (25 mg total) by mouth at bedtime. 30 tablet 3  . warfarin (COUMADIN) 7.5 MG tablet Take 11.25 mg by mouth daily.     No facility-administered medications prior to visit.     PAST MEDICAL HISTORY: Past Medical History:  Diagnosis Date  . Anxiety state, unspecified   . Back pain   . Cancer ([place])    Lung CA  . Chronic systolic heart failure ([place])    a. Ischemic CM (EF 30% by echo in 2011) b. echo 08/2015: EF 20% with severe HK and AK of the inferoseptal and apical myocardium.  . Coronary artery disease    a. 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  b. eventually required single vessel CABG (L-LAD) in 03/2009 due to restenosis. c. cath 2013 showed patent LIMA sequential to the diags with LAD filling retrograde, no new disease in RCA and Cx, low LVEDP, sx felt noncardiac. d. 08/2015: NSTEMI: CTO of prox LAD, 99% stenosis of small dCx (no stent), + for cocaine  . Esophageal reflux   . Heart attack ([place])   . Hypercholesteremia   . ICD (implantable cardioverter-defibrillator) infection ([place])    a. [employer] ICD 12/2008.   . Ischemic cardiomyopathy    echocardiogram 12/11: Mild  LVH, EF 30-35%, anteroseptal and apical akinesis, grade 1 diastolic dysfunction  . LBP (low back pain)   . OSA (obstructive sleep apnea) 12/30/2010  . Paroxysmal ventricular tachycardia ([place])   . Stroke ([place])   . Tobacco abuse     PAST SURGICAL HISTORY: Past Surgical History:  Procedure Laterality Date  . CARDIAC CATHETERIZATION N/A 09/06/2015   Procedure: Left Heart Cath and Cors/Grafts Angiography;  Surgeon: Sherren Mocha, MD;  Location: [facility];  Service: Cardiovascular;  Laterality: N/A;  . CARDIAC DEFIBRILLATOR PLACEMENT    . CARDIAC DEFIBRILLATOR PLACEMENT     2010 by JA  . CORONARY ANGIOPLASTY WITH STENT PLACEMENT     LAD stenting 10/09 and re-opening 6/10  . CORONARY ARTERY BYPASS GRAFT     1/11  . IR GENERIC HISTORICAL  05/21/2016   IR PERCUTANEOUS ART THROMBECTOMY/INFUSION INTRACRANIAL INC DIAG ANGIO 05/21/2016 Luanne Bras, MD [facility]  .  IR GENERIC HISTORICAL  06/16/2016   IR RADIOLOGIST EVAL & MGMT 06/16/2016 [facility]  . LEFT HEART CATHETERIZATION WITH CORONARY ANGIOGRAM N/A 08/07/2011   Procedure: LEFT HEART CATHETERIZATION WITH CORONARY ANGIOGRAM;  Surgeon: Josue Hector, MD;  Location: [facility];  Service: Cardiovascular;  Laterality: N/A;  . RADIOLOGY WITH ANESTHESIA N/A 05/21/2016   Procedure: RADIOLOGY WITH ANESTHESIA;  Surgeon: Luanne Bras, MD;  Location: [facility];  Service: Radiology;  Laterality: N/A;    FAMILY HISTORY: Family History  Problem Relation Age of Onset  . Heart attack Father 46       died at 45 after multiple MI's  . COPD Maternal Grandmother     SOCIAL HISTORY: Social History   Social History  . Marital status: Single    Spouse name: N/A  . Number of children: 1  . Years of education: N/A   Occupational History  . disabled Unemployed   Social History Main Topics  . Smoking status: Current Every Day Smoker    Packs/day: 0.50    Years: 20.00    Types: Cigarettes  . Smokeless tobacco: Former Systems developer    Types:  Chew     Comment: smoke 0.5 per week  . Alcohol use No  . Drug use: No     Comment: REMOTE H/O SUBSTANCE ABUSE  . Sexual activity: Not on file   Other Topics Concern  . Not on file   Social History Narrative   Pt lives with his mom, brother, and 21 yo son in [place].   Quit tobacco in Jan. 2011 prior to heart surgery (prior -1/2 PPD (down from 1 ppd per day, onset age 69)    alcohol abuse- prior, hasn't drank since age 51   no illicit drug use    Currently unemployed, awaiting disability   Single           PHYSICAL EXAM  There were no vitals filed for this visit. There is no height or weight on file to calculate BMI.  Generalized: Well developed, in no acute distress  Head: normocephalic and atraumatic,. Oropharynx benign  Neck: Supple, no carotid bruits  Cardiac: Regular rate rhythm, no murmur  Musculoskeletal: No deformity   Neurological examination   Mentation: Alert oriented to time, place, history taking. Attention span and concentration appropriate. Recent and remote memory intact.  Follows all commands speech and language fluent.   Cranial nerve II-XII: Fundoscopic exam reveals sharp disc margins.Pupils were equal round reactive to light extraocular movements were full, visual field were full on confrontational test. Facial sensation and strength were normal. hearing was intact to finger rubbing bilaterally. Uvula tongue midline. head turning and shoulder shrug were normal and symmetric.Tongue protrusion into cheek strength was normal. Motor: normal bulk and tone, full strength in the BUE, BLE, fine finger movements normal, no pronator drift. No focal weakness Sensory: normal and symmetric to light touch, pinprick, and  Vibration, proprioception  Coordination: finger-nose-finger, heel-to-shin bilaterally, no dysmetria Reflexes: Brachioradialis 2/2, biceps 2/2, triceps 2/2, patellar 2/2, Achilles 2/2, plantar responses were flexor bilaterally. Gait and Station:  Rising up from seated position without assistance, normal stance,  moderate stride, good arm swing, smooth turning, able to perform tiptoe, and heel walking without difficulty. Tandem gait is steady  DIAGNOSTIC DATA (LABS, IMAGING, TESTING) - I reviewed patient records, labs, notes, testing and imaging myself where available.  Lab Results  Component Value Date   WBC 11.1 (H) 07/28/2016   HGB 14.7 07/28/2016   HCT 44.7  07/28/2016   MCV 90.7 07/28/2016   PLT 189 07/28/2016      Component Value Date/Time   NA 139 08/11/2016 0837   K 3.9 08/11/2016 0837   CL 105 08/11/2016 0837   CO2 25 08/11/2016 0837   GLUCOSE 96 08/11/2016 0837   BUN 15 08/11/2016 0837   CREATININE 1.27 (H) 08/11/2016 0837   CALCIUM 9.0 08/11/2016 0837   PROT 6.7 10/28/2016 0832   ALBUMIN 3.6 10/28/2016 0832   AST 23 10/28/2016 0832   ALT 17 10/28/2016 0832   ALKPHOS 54 10/28/2016 0832   BILITOT 0.4 10/28/2016 0832   GFRNONAA >60 08/11/2016 0837   GFRAA >60 08/11/2016 0837   Lab Results  Component Value Date   CHOL 129 10/28/2016   HDL 30 (L) 10/28/2016   LDLCALC 61 10/28/2016   TRIG 190 (H) 10/28/2016   CHOLHDL 4.3 10/28/2016   Lab Results  Component Value Date   HGBA1C 5.4 05/22/2016   No results found for: [id number] Lab Results  Component Value Date   TSH 1.973 09/05/2015      ASSESSMENT AND PLAN 42 year Caucasian male with a cardioembolic left MCA branch infarct in February 2018 secondary to ischemic cardiomyopathy treated with IV TPA and mechanical embolectomy with complete recanalization of the left middle cerebral artery with excellent clinical outcome. Vascular risk factors of ischemic cardiomyopathy, coronary artery disease, smoking and hyperlipidemia    PLAN: I had a long d/w patient about his recent  embolic stroke, risk for recurrent stroke/TIAs, personally independently reviewed imaging studies and stroke evaluation results and answered questions.Continue warfarin daily  for  secondary stroke prevention given his cardiomyopathy and low ejection fraction and maintain strict control of hypertension with blood pressure goal below 130/90, diabetes with hemoglobin A1c goal below 6.5% and lipids with LDL cholesterol goal below 70 mg/dL. I also advised the patient to eat a healthy diet with plenty of whole grains, cereals, fruits and vegetables, exercise regularly and maintain ideal body weight .he was counseled to be compliant with his medications and medical follow-up and was complimented on quitting smoking. Followup in the future with my nurse practitioner in      Dennie Bible, [facility], [facility], [facility] [address] [place], [place] [address] [phone]

## 2017-01-08 ENCOUNTER — Ambulatory Visit: Payer: Medicare Other | Admitting: Nurse Practitioner

## 2017-01-12 ENCOUNTER — Encounter: Payer: Self-pay | Admitting: Nurse Practitioner

## 2017-04-20 ENCOUNTER — Ambulatory Visit (INDEPENDENT_AMBULATORY_CARE_PROVIDER_SITE_OTHER): Payer: Medicare Other | Admitting: *Deleted

## 2017-04-20 DIAGNOSIS — I255 Ischemic cardiomyopathy: Secondary | ICD-10-CM

## 2017-04-21 ENCOUNTER — Encounter: Payer: Self-pay | Admitting: Cardiology

## 2017-04-21 ENCOUNTER — Telehealth: Payer: Self-pay

## 2017-04-21 NOTE — Telephone Encounter (Signed)
LVM to schedule apt with JA or AS overdue for f/u.

## 2017-04-21 NOTE — Progress Notes (Signed)
Remote ICD transmission.   

## 2017-04-24 LAB — CUP PACEART REMOTE DEVICE CHECK
Battery Remaining Longevity: 42 mo
Battery Remaining Percentage: 53 %
Brady Statistic RA Percent Paced: 0 %
Date Time Interrogation Session: 20190121233100
HIGH POWER IMPEDANCE MEASURED VALUE: 55 Ohm
Implantable Lead Implant Date: 20101005
Implantable Lead Location: 753860
Implantable Lead Model: 158
Implantable Lead Model: 4136
Implantable Lead Serial Number: 28628809
Lead Channel Impedance Value: 449 Ohm
Lead Channel Impedance Value: 546 Ohm
Lead Channel Pacing Threshold Amplitude: 0.7 V
Lead Channel Setting Pacing Amplitude: 2.8 V
MDC IDC LEAD IMPLANT DT: 20101005
MDC IDC LEAD LOCATION: 753859
MDC IDC LEAD SERIAL: 300495
MDC IDC MSMT LEADCHNL RA PACING THRESHOLD PULSEWIDTH: 0.4 ms
MDC IDC MSMT LEADCHNL RV PACING THRESHOLD AMPLITUDE: 1.2 V
MDC IDC MSMT LEADCHNL RV PACING THRESHOLD PULSEWIDTH: 0.4 ms
MDC IDC PG IMPLANT DT: 20101005
MDC IDC SET LEADCHNL RA PACING AMPLITUDE: 2 V
MDC IDC SET LEADCHNL RV PACING PULSEWIDTH: 0.4 ms
MDC IDC SET LEADCHNL RV SENSING SENSITIVITY: 0.6 mV
MDC IDC STAT BRADY RV PERCENT PACED: 0 %
Pulse Gen Serial Number: 152061

## 2017-05-13 ENCOUNTER — Encounter: Payer: Self-pay | Admitting: Internal Medicine

## 2017-05-28 ENCOUNTER — Encounter: Payer: Self-pay | Admitting: Internal Medicine

## 2017-07-20 ENCOUNTER — Telehealth: Payer: Self-pay | Admitting: Cardiology

## 2017-07-20 ENCOUNTER — Ambulatory Visit (INDEPENDENT_AMBULATORY_CARE_PROVIDER_SITE_OTHER): Payer: Medicare Other | Admitting: *Deleted

## 2017-07-20 DIAGNOSIS — I255 Ischemic cardiomyopathy: Secondary | ICD-10-CM | POA: Diagnosis not present

## 2017-07-20 NOTE — Telephone Encounter (Signed)
LMOVM reminding pt to send remote transmission.   

## 2017-07-21 ENCOUNTER — Encounter: Payer: Self-pay | Admitting: Cardiology

## 2017-07-21 NOTE — Progress Notes (Signed)
Remote ICD transmission.   

## 2017-07-23 LAB — CUP PACEART REMOTE DEVICE CHECK
Battery Remaining Longevity: 42 mo
Battery Remaining Percentage: 47 %
Date Time Interrogation Session: 20190423053000
HighPow Impedance: 51 Ohm
Implantable Lead Implant Date: 20101005
Implantable Lead Location: 753860
Implantable Pulse Generator Implant Date: 20101005
Lead Channel Setting Pacing Amplitude: 2 V
Lead Channel Setting Pacing Amplitude: 2.8 V
Lead Channel Setting Pacing Pulse Width: 0.4 ms
MDC IDC LEAD IMPLANT DT: 20101005
MDC IDC LEAD LOCATION: 753859
MDC IDC LEAD SERIAL: 28628809
MDC IDC LEAD SERIAL: 300495
MDC IDC MSMT LEADCHNL RA IMPEDANCE VALUE: 524 Ohm
MDC IDC MSMT LEADCHNL RA PACING THRESHOLD AMPLITUDE: 0.7 V
MDC IDC MSMT LEADCHNL RA PACING THRESHOLD PULSEWIDTH: 0.4 ms
MDC IDC MSMT LEADCHNL RV IMPEDANCE VALUE: 417 Ohm
MDC IDC MSMT LEADCHNL RV PACING THRESHOLD AMPLITUDE: 1.2 V
MDC IDC MSMT LEADCHNL RV PACING THRESHOLD PULSEWIDTH: 0.4 ms
MDC IDC SET LEADCHNL RV SENSING SENSITIVITY: 0.6 mV
MDC IDC STAT BRADY RA PERCENT PACED: 0 %
MDC IDC STAT BRADY RV PERCENT PACED: 0 %
Pulse Gen Serial Number: 152061

## 2017-10-19 ENCOUNTER — Ambulatory Visit (INDEPENDENT_AMBULATORY_CARE_PROVIDER_SITE_OTHER): Payer: Medicare Other | Admitting: *Deleted

## 2017-10-19 DIAGNOSIS — I255 Ischemic cardiomyopathy: Secondary | ICD-10-CM

## 2017-10-19 NOTE — Progress Notes (Signed)
Remote ICD transmission.   

## 2017-10-20 ENCOUNTER — Encounter: Payer: Self-pay | Admitting: Cardiology

## 2017-10-20 NOTE — Progress Notes (Signed)
Letter  

## 2017-11-25 LAB — CUP PACEART REMOTE DEVICE CHECK
Battery Remaining Longevity: 36 mo
Date Time Interrogation Session: 20190722082300
HighPow Impedance: 56 Ohm
Implantable Lead Implant Date: 20101005
Implantable Lead Serial Number: 28628809
Implantable Lead Serial Number: 300495
Implantable Pulse Generator Implant Date: 20101005
Lead Channel Pacing Threshold Amplitude: 0.7 V
Lead Channel Pacing Threshold Pulse Width: 0.4 ms
Lead Channel Pacing Threshold Pulse Width: 0.4 ms
Lead Channel Setting Pacing Amplitude: 2 V
Lead Channel Setting Pacing Pulse Width: 0.4 ms
Lead Channel Setting Sensing Sensitivity: 0.6 mV
MDC IDC LEAD IMPLANT DT: 20101005
MDC IDC LEAD LOCATION: 753859
MDC IDC LEAD LOCATION: 753860
MDC IDC MSMT BATTERY REMAINING PERCENTAGE: 43 %
MDC IDC MSMT LEADCHNL RA IMPEDANCE VALUE: 554 Ohm
MDC IDC MSMT LEADCHNL RV IMPEDANCE VALUE: 431 Ohm
MDC IDC MSMT LEADCHNL RV PACING THRESHOLD AMPLITUDE: 1.2 V
MDC IDC PG SERIAL: 152061
MDC IDC SET LEADCHNL RV PACING AMPLITUDE: 2.8 V
MDC IDC STAT BRADY RA PERCENT PACED: 0 %
MDC IDC STAT BRADY RV PERCENT PACED: 0 %

## 2018-01-18 ENCOUNTER — Telehealth: Payer: Self-pay

## 2018-01-18 ENCOUNTER — Encounter: Payer: Medicare Other | Admitting: *Deleted

## 2018-01-18 NOTE — Telephone Encounter (Signed)
LMOVM reminding pt to send remote transmission.   

## 2018-01-19 ENCOUNTER — Ambulatory Visit (INDEPENDENT_AMBULATORY_CARE_PROVIDER_SITE_OTHER): Payer: Medicare Other | Admitting: *Deleted

## 2018-01-19 ENCOUNTER — Encounter: Payer: Self-pay | Admitting: Cardiology

## 2018-01-19 DIAGNOSIS — I255 Ischemic cardiomyopathy: Secondary | ICD-10-CM | POA: Diagnosis not present

## 2018-01-20 ENCOUNTER — Encounter: Payer: Self-pay | Admitting: Cardiology

## 2018-01-20 NOTE — Progress Notes (Signed)
Remote ICD transmission.   

## 2018-01-27 ENCOUNTER — Other Ambulatory Visit (HOSPITAL_COMMUNITY): Payer: Self-pay

## 2018-01-27 ENCOUNTER — Ambulatory Visit (HOSPITAL_COMMUNITY)
Admission: RE | Admit: 2018-01-27 | Discharge: 2018-01-27 | Disposition: A | Discharge status: Home / Self Care | Payer: Medicare Other | Source: Ambulatory Visit | Attending: Cardiology | Admitting: Cardiology

## 2018-01-27 ENCOUNTER — Encounter (HOSPITAL_COMMUNITY): Payer: Self-pay

## 2018-01-27 VITALS — BP 122/66 | HR 66 | Wt 181.0 lb

## 2018-01-27 DIAGNOSIS — I2510 Atherosclerotic heart disease of native coronary artery without angina pectoris: Secondary | ICD-10-CM | POA: Insufficient documentation

## 2018-01-27 DIAGNOSIS — Z8673 Personal history of transient ischemic attack (TIA), and cerebral infarction without residual deficits: Secondary | ICD-10-CM | POA: Insufficient documentation

## 2018-01-27 DIAGNOSIS — Z5181 Encounter for therapeutic drug level monitoring: Secondary | ICD-10-CM

## 2018-01-27 DIAGNOSIS — R5383 Other fatigue: Secondary | ICD-10-CM | POA: Diagnosis not present

## 2018-01-27 DIAGNOSIS — Z951 Presence of aortocoronary bypass graft: Secondary | ICD-10-CM | POA: Diagnosis not present

## 2018-01-27 DIAGNOSIS — Z720 Tobacco use: Secondary | ICD-10-CM

## 2018-01-27 DIAGNOSIS — I5022 Chronic systolic (congestive) heart failure: Secondary | ICD-10-CM | POA: Insufficient documentation

## 2018-01-27 DIAGNOSIS — I5042 Chronic combined systolic (congestive) and diastolic (congestive) heart failure: Secondary | ICD-10-CM

## 2018-01-27 DIAGNOSIS — Z79899 Other long term (current) drug therapy: Secondary | ICD-10-CM | POA: Insufficient documentation

## 2018-01-27 DIAGNOSIS — Z7901 Long term (current) use of anticoagulants: Secondary | ICD-10-CM | POA: Insufficient documentation

## 2018-01-27 DIAGNOSIS — J449 Chronic obstructive pulmonary disease, unspecified: Secondary | ICD-10-CM | POA: Insufficient documentation

## 2018-01-27 DIAGNOSIS — I255 Ischemic cardiomyopathy: Secondary | ICD-10-CM | POA: Diagnosis not present

## 2018-01-27 DIAGNOSIS — I214 Non-ST elevation (NSTEMI) myocardial infarction: Secondary | ICD-10-CM | POA: Diagnosis not present

## 2018-01-27 DIAGNOSIS — G4733 Obstructive sleep apnea (adult) (pediatric): Secondary | ICD-10-CM | POA: Diagnosis not present

## 2018-01-27 DIAGNOSIS — R0602 Shortness of breath: Secondary | ICD-10-CM | POA: Insufficient documentation

## 2018-01-27 DIAGNOSIS — I6340 Cerebral infarction due to embolism of unspecified cerebral artery: Secondary | ICD-10-CM | POA: Diagnosis not present

## 2018-01-27 DIAGNOSIS — I472 Ventricular tachycardia: Secondary | ICD-10-CM | POA: Insufficient documentation

## 2018-01-27 LAB — CBC
HCT: 42.1 % (ref 39.0–52.0)
Hemoglobin: 13.5 g/dL (ref 13.0–17.0)
MCH: 29.2 pg (ref 26.0–34.0)
MCHC: 32.1 g/dL (ref 30.0–36.0)
MCV: 91.1 fL (ref 80.0–100.0)
NRBC: 0 % (ref 0.0–0.2)
PLATELETS: 175 10*3/uL (ref 150–400)
RBC: 4.62 MIL/uL (ref 4.22–5.81)
RDW: 17.2 % — AB (ref 11.5–15.5)
WBC: 9.6 10*3/uL (ref 4.0–10.5)

## 2018-01-27 LAB — PROTIME-INR
INR: 0.99
Prothrombin Time: 13 seconds (ref 11.4–15.2)

## 2018-01-27 LAB — BASIC METABOLIC PANEL
Anion gap: 6 (ref 5–15)
BUN: 11 mg/dL (ref 6–20)
CHLORIDE: 105 mmol/L (ref 98–111)
CO2: 26 mmol/L (ref 22–32)
Calcium: 8.9 mg/dL (ref 8.9–10.3)
Creatinine, Ser: 0.98 mg/dL (ref 0.61–1.24)
GFR calc non Af Amer: >60 mL/min (ref >60)
Glucose, Bld: 96 mg/dL (ref 70–99)
Potassium: 4.4 mmol/L (ref 3.5–5.1)
Sodium: 137 mmol/L (ref 135–145)

## 2018-01-27 MED ORDER — SPIRONOLACTONE 25 MG PO TABS: 25.0000 mg | ORAL_TABLET | Freq: Every day | ORAL | 3 refills | Status: DC

## 2018-01-27 MED ORDER — AMIODARONE HCL 200 MG PO TABS: 200.0000 mg | ORAL_TABLET | Freq: Every day | ORAL | 1 refills | Status: DC

## 2018-01-27 MED ORDER — FUROSEMIDE 40 MG PO TABS: 40.0000 mg | ORAL_TABLET | Freq: Every day | ORAL | 3 refills | Status: DC

## 2018-01-27 MED ORDER — AMIODARONE HCL 200 MG PO TABS: 200.0000 mg | ORAL_TABLET | Freq: Two times a day (BID) | ORAL | 1 refills | Status: DC

## 2018-01-27 MED ORDER — POTASSIUM CHLORIDE CRYS ER 20 MEQ PO TBCR: 20.0000 meq | EXTENDED_RELEASE_TABLET | Freq: Every day | ORAL | 3 refills | Status: DC

## 2018-01-27 MED ORDER — CARVEDILOL 6.25 MG PO TABS: 6.2500 mg | ORAL_TABLET | Freq: Two times a day (BID) | ORAL | 6 refills | Status: DC

## 2018-01-27 MED ORDER — WARFARIN SODIUM 7.5 MG PO TABS: 11.2500 mg | ORAL_TABLET | Freq: Every day | ORAL | 0 refills | Status: DC

## 2018-01-27 MED ORDER — SACUBITRIL-VALSARTAN 24-26 MG PO TABS: 1.0000 | ORAL_TABLET | Freq: Two times a day (BID) | ORAL | 6 refills | Status: DC

## 2018-01-27 MED ORDER — RIVAROXABAN 20 MG PO TABS: 20.0000 mg | ORAL_TABLET | Freq: Every day | ORAL | 1 refills | Status: DC

## 2018-01-27 NOTE — H&P (View-Only) (Signed)
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  He had his first MI with occlusion of LAD in 10/9 treated with BMS.  He then had POBA of reocclusion of LAD stent in 6/10. Finally, he had sequential LIMA-D1 and D2 in 1/11.  NSTEMI in 6/17 (cocaine positive) showed 99% stenosis small distal LCx as likely culprit. This was medically managed. Echo in 6/17 showed EF 20% with wall motion abnormalities.   He was admitted in 12/17 with RUQ abdominal pain.  There was concern for acute cholecystitis based on CT of abdomen, but HIDA scan was negative. He left AMA.    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  UDS was positive for cocaine. CTA showed occlusion of left M2 segment of MCA.  He had tPA and mechanical thrombectomy.  He had complete recovery with resolution of neurologic symptoms.  Suspicion for cardio-embolic cause (LV thrombus), so warfarin was started. He was taken off of most of his cardiac meds due to low BP, these have subsequently been restarted.   Last seen in HF clinic 07/2016. He presents today to re-establish and due to fatigue and SOB. 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 SOB walking around the house or getting dressed, and worse over the past week. Denies overt chest pain. Has had a "burning in his chest" over the past week that is worse with exertion and improves with rest or Nexium. Smoking 1 ppd. Denies ETOH or substance abuse. No residual abnormalities from CVA. He states he is still on coumadin and last had it check "a couple of months ago", though last recorded date in system 07/2016. Denies orthopnea or PND.   ICD interrogation: [place] Sci: VT 12/25/17 and 01/05/18, no therapy. Asymptomatic. Battery life 2.5 years. No Heart Logic.   ECG  (12/17): NSR, IVCD 139 msec  Labs (6/17): K 3.8, creatinine 1.26, HCT 42.4, LDL 81, HDL 34  Labs (10/17): K 4, creatinine 1.42, LDL 122, BNP 475 Labs (12/17): K 4, creatinine 1.39, hgb 13.6 Labs (2/18): K 4.2, creatinine 1.17, LDL 78, hgb 15.3 Labs (4/18): K 3.9, creatinine 1.2, LDL 91, HDL 40 Labs (5/18): K 3.9, creatinine 1.27  PMH: 1. GERD 2. OSA 3. H/o VT 4. COPD: Active smoker. 5. Chronic systolic CHF: Ischemic cardiomyopathy. Has [employer] ICD.  - Echo (6/17): EF 20%, wall motion abnormalities, mild MR - CPX (11/17): peak VO2 19.7 (51% predicted), VE/VCO2 slope 46.9, RER 0.92 => Submaximal, but probably moderate to severe functional impairment => suspect restrictive/obstructive lung disease and HF play a role in the abnormality, probably moderate impairment in terms of HF.  - Echo (2/18): EF 15-20%, severe LV dilation, mildly decreased RV systolic function, PASP 45 mmHg.  6. CAD: Acute MI in 10/09 with BMS to LAD - POBA to LAD stent in 6/10.  - CABG with sequential LIMA-D1 and D2 in 1/11. - NSTEMI 6/17: LHC with patent sequential LIMA-D1 and D2 with filling of LAD, occlusion proximal LAD, subtotal occlusion of distal LCx into small OM (likely culprit).  No PCI. He was cocaine positive.  7. Symptomatic cholelithiasis: Negative HIDA scan 12/17.  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  Thought to be due to cardio-embolism (LV thrombus).   SH:  Disabled, active smoker (down to 1/2 pack every 2 wks), history of cocaine use, no ETOH, lives in [place].  FH: Father with 1st MI in his 73s.  Review of systems complete and found to be negative unless listed in HPI.    Current Outpatient Medications  Medication Sig Dispense Refill  . carvedilol (COREG) 6.25 MG tablet Take 1 tablet (6.25 mg total) by mouth 2 (two) times daily. 60 tablet 6  . furosemide (LASIX) 40 MG tablet Take 1 tablet (40 mg total) by mouth daily. 90 tablet  3  . nitroGLYCERIN (NITROSTAT) 0.4 MG SL tablet Place 1 tablet (0.4 mg total) under the tongue every 5 (five) minutes as needed for chest pain. 100 tablet 0  . potassium chloride SA (K-DUR,KLOR-CON) 20 MEQ tablet Take 1 tablet (20 mEq total) by mouth daily. 90 tablet 3  . sacubitril-valsartan (ENTRESTO) 24-26 MG Take 1 tablet by mouth 2 (two) times daily. 60 tablet 6  . spironolactone (ALDACTONE) 25 MG tablet Take 1 tablet (25 mg total) by mouth at bedtime. 30 tablet 3  . warfarin (COUMADIN) 7.5 MG tablet Take 11.25 mg by mouth daily.     No current facility-administered medications for this encounter.    Vitals:   01/27/18 0910  BP: 122/66  Pulse: 66  SpO2: 100%  Weight: 82.1 kg (181 lb)    Wt Readings from Last 3 Encounters:  01/27/18 82.1 kg (181 lb)  08/27/16 94.5 kg (208 lb 6.4 oz)  07/28/16 96.8 kg (213 lb 8 oz)    Physical Exam  General: Well appearing. Thin. Disheveled.  HEENT: Normal, poor dentition. Neck: Supple. JVP 5-6. Carotids 2+ bilat; no bruits. No thyromegaly or nodule noted. Cor: PMI nondisplaced. RRR, No M/G/R noted Lungs: CTAB, normal effort. Abdomen: Soft, non-tender, non-distended, no HSM. No bruits or masses. +BS  Extremities: No cyanosis, clubbing, or rash. R and LLE no edema.  Neuro: Alert & orientedx3, cranial nerves grossly intact. moves all 4 extremities w/o difficulty. Affect pleasant   Assessment/Plan: 1. CAD: s/p CABG.   - 6/17 NSTEMI with subtotal occlusion small distal LCx managed medically.   - History concerning for his anginal equivalent. Will plan for [facility] next week with Dr. Aundra Dubin.  - Continue ASA 81.   - Continue Crestor 40 mg daily. LDL 68 09/2016. Needs Lipids/LFTs. 2. Chronic systolic CHF: Ischemic CMP.   - s/p [employer] ICD.  EF 15-20% on 2/18 echo.  COPD likely also plays a role in his dyspnea. Submaximal CPX in 11/17 but suggestive of both heart and lung limitation => appears to have moderate limitation in terms of heart  failure.  - NYHA III-IIIb symptoms now in setting of his anginal euqivalent. - Volume status looks OK on exam.  - He reports he HAS been taking his medications.   - Continue Lasix 40 daily + KCl 20 daily.   - Continue spironolactone 25 mg daily.  - Continue Entresto 24/26 mg BID  - Continue Coreg 6.25 mg BID - Reinforced fluid restriction to < 2 L daily, sodium restriction to less than 2000 mg daily, and the importance of daily weights.   3. Tobacco abuse - Encouraged complete cessation.  4. CVA:  - Possibly cardio-embolic, LV thrombus.  - Will check INR today. If low, should consider switching to a NOAC, as he has not been compliant with coumadin checks.  5. VT - Noted on ICD interrogation. Asymptomatic. No shocks.  - Interrogation arrived to provider after patient had left. Discussed with  Dr. Aundra Dubin. Will start on amiodarone 200 mg BID for now and follow. Instruct pt to present to ED with any shock or worsening symptoms.   RTC 3 weeks. Sooner with symptoms. Pt knows to go to ED with worsening symptoms.   Shirley Friar, PA-C  01/27/2018  Greater than 50% of the 30 minute visit was spent in counseling/coordination of care regarding disease state education, salt/fluid restriction, sliding scale diuretics, and medication compliance.

## 2018-01-27 NOTE — Patient Instructions (Signed)
All of your medications have be refilled and sent to the pharmacy you requested ([facility])  Your physician recommends that you schedule a follow-up appointment in: 3 weeks with Rebecca Eaton  in the [facility] (PA/NP) [facility] [id number] [place] [address] Dept: [phone] Loc: [phone]  ERNIE KASLER  01/27/2018  You are scheduled for a Cardiac Catheterization on Wednesday, November 6 with Dr. Loralie Champagne.  1. Please arrive at the [facility] (Main Entrance A) at [facility]: [address] [place], [place] [address] at 6:30 AM (This time is two hours before your procedure to ensure your preparation). Free valet parking service is available.   Special note: Every effort is made to have your procedure done on time. Please understand that emergencies sometimes delay scheduled procedures.  2. Diet: Do not eat solid foods after midnight.  The patient may have clear liquids until 5am upon the day of the procedure.  3. Labs: Pre Cath Labs done 01/27/18  4. Medication instructions in preparation for your procedure:   Contrast Allergy: No    WE WILL CALL YOU WITH DETAILED INSTRUCTIONS REGARDING YOUR COUMADIN   Stop taking, Lasix (Furosemide)  Wednesday, November 6,      On the morning of your procedure, take your Aspirin and any morning medicines NOT listed above.  You may use sips of water.  5. Plan for one night stay--bring personal belongings. 6. Bring a current list of your medications and current insurance cards. 7. You MUST have a responsible person to drive you home. 8. Someone MUST be with you the first 24 hours after you arrive home or your discharge will be delayed. 9. Please wear clothes that are easy to get on and off and wear slip-on shoes.  Thank you for allowing [place] to care for you!   -- [facility]

## 2018-01-27 NOTE — Progress Notes (Signed)
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  He had his first MI with occlusion of LAD in 10/9 treated with BMS.  He then had POBA of reocclusion of LAD stent in 6/10. Finally, he had sequential LIMA-D1 and D2 in 1/11.  NSTEMI in 6/17 (cocaine positive) showed 99% stenosis small distal LCx as likely culprit. This was medically managed. Echo in 6/17 showed EF 20% with wall motion abnormalities.   He was admitted in 12/17 with RUQ abdominal pain.  There was concern for acute cholecystitis based on CT of abdomen, but HIDA scan was negative. He left AMA.    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  UDS was positive for cocaine. CTA showed occlusion of left M2 segment of MCA.  He had tPA and mechanical thrombectomy.  He had complete recovery with resolution of neurologic symptoms.  Suspicion for cardio-embolic cause (LV thrombus), so warfarin was started. He was taken off of most of his cardiac meds due to low BP, these have subsequently been restarted.   Last seen in HF clinic 07/2016. He presents today to re-establish and due to fatigue and SOB. 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 SOB walking around the house or getting dressed, and worse over the past week. Denies overt chest pain. Has had a "burning in his chest" over the past week that is worse with exertion and improves with rest or Nexium. Smoking 1 ppd. Denies ETOH or substance abuse. No residual abnormalities from CVA. He states he is still on coumadin and last had it check "a couple of months ago", though last recorded date in system 07/2016. Denies orthopnea or PND.   ICD interrogation: [employer] Sci: VT 12/25/17 and 01/05/18, no therapy. Asymptomatic. Battery life 2.5 years. No Heart Logic.   ECG  (12/17): NSR, IVCD 139 msec  Labs (6/17): K 3.8, creatinine 1.26, HCT 42.4, LDL 81, HDL 34  Labs (10/17): K 4, creatinine 1.42, LDL 122, BNP 475 Labs (12/17): K 4, creatinine 1.39, hgb 13.6 Labs (2/18): K 4.2, creatinine 1.17, LDL 78, hgb 15.3 Labs (4/18): K 3.9, creatinine 1.2, LDL 91, HDL 40 Labs (5/18): K 3.9, creatinine 1.27  PMH: 1. GERD 2. OSA 3. H/o VT 4. COPD: Active smoker. 5. Chronic systolic CHF: Ischemic cardiomyopathy. Has [employer] ICD.  - Echo (6/17): EF 20%, wall motion abnormalities, mild MR - CPX (11/17): peak VO2 19.7 (51% predicted), VE/VCO2 slope 46.9, RER 0.92 => Submaximal, but probably moderate to severe functional impairment => suspect restrictive/obstructive lung disease and HF play a role in the abnormality, probably moderate impairment in terms of HF.  - Echo (2/18): EF 15-20%, severe LV dilation, mildly decreased RV systolic function, PASP 45 mmHg.  6. CAD: Acute MI in 10/09 with BMS to LAD - POBA to LAD stent in 6/10.  - CABG with sequential LIMA-D1 and D2 in 1/11. - NSTEMI 6/17: LHC with patent sequential LIMA-D1 and D2 with filling of LAD, occlusion proximal LAD, subtotal occlusion of distal LCx into small OM (likely culprit).  No PCI. He was cocaine positive.  7. Symptomatic cholelithiasis: Negative HIDA scan 12/17.  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  Thought to be due to cardio-embolism (LV thrombus).   SH:  Disabled, active smoker (down to 1/2 pack every 2 wks), history of cocaine use, no ETOH, lives in [place].  FH: Father with 1st MI in his 13s.  Review of systems complete and found to be negative unless listed in HPI.    Current Outpatient Medications  Medication Sig Dispense Refill  . carvedilol (COREG) 6.25 MG tablet Take 1 tablet (6.25 mg total) by mouth 2 (two) times daily. 60 tablet 6  . furosemide (LASIX) 40 MG tablet Take 1 tablet (40 mg total) by mouth daily. 90 tablet  3  . nitroGLYCERIN (NITROSTAT) 0.4 MG SL tablet Place 1 tablet (0.4 mg total) under the tongue every 5 (five) minutes as needed for chest pain. 100 tablet 0  . potassium chloride SA (K-DUR,KLOR-CON) 20 MEQ tablet Take 1 tablet (20 mEq total) by mouth daily. 90 tablet 3  . sacubitril-valsartan (ENTRESTO) 24-26 MG Take 1 tablet by mouth 2 (two) times daily. 60 tablet 6  . spironolactone (ALDACTONE) 25 MG tablet Take 1 tablet (25 mg total) by mouth at bedtime. 30 tablet 3  . warfarin (COUMADIN) 7.5 MG tablet Take 11.25 mg by mouth daily.     No current facility-administered medications for this encounter.    Vitals:   01/27/18 0910  BP: 122/66  Pulse: 66  SpO2: 100%  Weight: 82.1 kg (181 lb)    Wt Readings from Last 3 Encounters:  01/27/18 82.1 kg (181 lb)  08/27/16 94.5 kg (208 lb 6.4 oz)  07/28/16 96.8 kg (213 lb 8 oz)    Physical Exam  General: Well appearing. Thin. Disheveled.  HEENT: Normal, poor dentition. Neck: Supple. JVP 5-6. Carotids 2+ bilat; no bruits. No thyromegaly or nodule noted. Cor: PMI nondisplaced. RRR, No M/G/R noted Lungs: CTAB, normal effort. Abdomen: Soft, non-tender, non-distended, no HSM. No bruits or masses. +BS  Extremities: No cyanosis, clubbing, or rash. R and LLE no edema.  Neuro: Alert & orientedx3, cranial nerves grossly intact. moves all 4 extremities w/o difficulty. Affect pleasant   Assessment/Plan: 1. CAD: s/p CABG.   - 6/17 NSTEMI with subtotal occlusion small distal LCx managed medically.   - History concerning for his anginal equivalent. Will plan for [facility] next week with Dr. Aundra Dubin.  - Continue ASA 81.   - Continue Crestor 40 mg daily. LDL 68 09/2016. Needs Lipids/LFTs. 2. Chronic systolic CHF: Ischemic CMP.   - s/p [employer] ICD.  EF 15-20% on 2/18 echo.  COPD likely also plays a role in his dyspnea. Submaximal CPX in 11/17 but suggestive of both heart and lung limitation => appears to have moderate limitation in terms of heart  failure.  - NYHA III-IIIb symptoms now in setting of his anginal euqivalent. - Volume status looks OK on exam.  - He reports he HAS been taking his medications.   - Continue Lasix 40 daily + KCl 20 daily.   - Continue spironolactone 25 mg daily.  - Continue Entresto 24/26 mg BID  - Continue Coreg 6.25 mg BID - Reinforced fluid restriction to < 2 L daily, sodium restriction to less than 2000 mg daily, and the importance of daily weights.   3. Tobacco abuse - Encouraged complete cessation.  4. CVA:  - Possibly cardio-embolic, LV thrombus.  - Will check INR today. If low, should consider switching to a NOAC, as he has not been compliant with coumadin checks.  5. VT - Noted on ICD interrogation. Asymptomatic. No shocks.  - Interrogation arrived to provider after patient had left. Discussed with  Dr. Aundra Dubin. Will start on amiodarone 200 mg BID for now and follow. Instruct pt to present to ED with any shock or worsening symptoms.   RTC 3 weeks. Sooner with symptoms. Pt knows to go to ED with worsening symptoms.   Shirley Friar, PA-C  01/27/2018  Greater than 50% of the 30 minute visit was spent in counseling/coordination of care regarding disease state education, salt/fluid restriction, sliding scale diuretics, and medication compliance.

## 2018-01-28 ENCOUNTER — Other Ambulatory Visit (HOSPITAL_COMMUNITY): Payer: Self-pay | Admitting: *Deleted

## 2018-01-28 DIAGNOSIS — I5042 Chronic combined systolic (congestive) and diastolic (congestive) heart failure: Secondary | ICD-10-CM

## 2018-02-03 ENCOUNTER — Encounter (HOSPITAL_COMMUNITY)
Admission: RE | Disposition: A | Discharge status: Home / Self Care | Payer: Self-pay | Source: Ambulatory Visit | Attending: Cardiology

## 2018-02-03 ENCOUNTER — Encounter (HOSPITAL_COMMUNITY): Payer: Self-pay | Admitting: Cardiology

## 2018-02-03 ENCOUNTER — Ambulatory Visit (HOSPITAL_COMMUNITY)
Admission: RE | Admit: 2018-02-03 | Discharge: 2018-02-03 | Disposition: A | Discharge status: Home / Self Care | Payer: Medicare Other | Source: Ambulatory Visit | Attending: Cardiology | Admitting: Cardiology

## 2018-02-03 ENCOUNTER — Other Ambulatory Visit: Payer: Self-pay

## 2018-02-03 DIAGNOSIS — Z951 Presence of aortocoronary bypass graft: Secondary | ICD-10-CM | POA: Diagnosis not present

## 2018-02-03 DIAGNOSIS — J449 Chronic obstructive pulmonary disease, unspecified: Secondary | ICD-10-CM | POA: Diagnosis not present

## 2018-02-03 DIAGNOSIS — Z8673 Personal history of transient ischemic attack (TIA), and cerebral infarction without residual deficits: Secondary | ICD-10-CM | POA: Insufficient documentation

## 2018-02-03 DIAGNOSIS — Z7901 Long term (current) use of anticoagulants: Secondary | ICD-10-CM | POA: Diagnosis not present

## 2018-02-03 DIAGNOSIS — I2510 Atherosclerotic heart disease of native coronary artery without angina pectoris: Secondary | ICD-10-CM

## 2018-02-03 DIAGNOSIS — R0609 Other forms of dyspnea: Secondary | ICD-10-CM | POA: Diagnosis not present

## 2018-02-03 DIAGNOSIS — I5022 Chronic systolic (congestive) heart failure: Secondary | ICD-10-CM | POA: Diagnosis not present

## 2018-02-03 DIAGNOSIS — Z79899 Other long term (current) drug therapy: Secondary | ICD-10-CM | POA: Insufficient documentation

## 2018-02-03 DIAGNOSIS — I214 Non-ST elevation (NSTEMI) myocardial infarction: Secondary | ICD-10-CM | POA: Diagnosis not present

## 2018-02-03 DIAGNOSIS — I25119 Atherosclerotic heart disease of native coronary artery with unspecified angina pectoris: Secondary | ICD-10-CM | POA: Diagnosis not present

## 2018-02-03 DIAGNOSIS — Z8249 Family history of ischemic heart disease and other diseases of the circulatory system: Secondary | ICD-10-CM | POA: Insufficient documentation

## 2018-02-03 DIAGNOSIS — K219 Gastro-esophageal reflux disease without esophagitis: Secondary | ICD-10-CM | POA: Insufficient documentation

## 2018-02-03 DIAGNOSIS — G4733 Obstructive sleep apnea (adult) (pediatric): Secondary | ICD-10-CM | POA: Diagnosis not present

## 2018-02-03 DIAGNOSIS — I255 Ischemic cardiomyopathy: Secondary | ICD-10-CM | POA: Diagnosis not present

## 2018-02-03 DIAGNOSIS — I5042 Chronic combined systolic (congestive) and diastolic (congestive) heart failure: Secondary | ICD-10-CM

## 2018-02-03 HISTORY — PX: RIGHT/LEFT HEART CATH AND CORONARY ANGIOGRAPHY: CATH118266

## 2018-02-03 LAB — POCT I-STAT 3, VENOUS BLOOD GAS (G3P V)
Acid-Base Excess: 2 mmol/L (ref 0.0–2.0)
Acid-Base Excess: 3 mmol/L — ABNORMAL HIGH (ref 0.0–2.0)
BICARBONATE: 29.7 mmol/L — AB (ref 20.0–28.0)
Bicarbonate: 29 mmol/L — ABNORMAL HIGH (ref 20.0–28.0)
O2 Saturation: 58 %
O2 Saturation: 60 %
PCO2 VEN: 53 mmHg (ref 44.0–60.0)
PCO2 VEN: 53.1 mmHg (ref 44.0–60.0)
PH VEN: 7.346 (ref 7.250–7.430)
PO2 VEN: 33 mmHg (ref 32.0–45.0)
PO2 VEN: 33 mmHg (ref 32.0–45.0)
TCO2: 31 mmol/L (ref 22–32)
TCO2: 31 mmol/L (ref 22–32)
pH, Ven: 7.355 (ref 7.250–7.430)

## 2018-02-03 LAB — PROTIME-INR
INR: 0.99
Prothrombin Time: 13 seconds (ref 11.4–15.2)

## 2018-02-03 SURGERY — RIGHT/LEFT HEART CATH AND CORONARY ANGIOGRAPHY
Anesthesia: LOCAL

## 2018-02-03 MED ORDER — SODIUM CHLORIDE 0.9% FLUSH: 3.0000 mL | Freq: Two times a day (BID) | INTRAVENOUS | Status: DC

## 2018-02-03 MED ORDER — SODIUM CHLORIDE 0.9 % IV SOLN: 250.0000 mL | INTRAVENOUS | Status: DC | PRN

## 2018-02-03 MED ORDER — HEPARIN SODIUM (PORCINE) 1000 UNIT/ML IJ SOLN
INTRAMUSCULAR | Status: DC | PRN
  Administered 2018-02-03: 4000 [IU] via INTRAVENOUS

## 2018-02-03 MED ORDER — FUROSEMIDE 40 MG PO TABS: 40.0000 mg | ORAL_TABLET | Freq: Two times a day (BID) | ORAL | 3 refills | Status: DC

## 2018-02-03 MED ORDER — LIDOCAINE HCL (PF) 1 % IJ SOLN
INTRAMUSCULAR | Status: DC | PRN
  Administered 2018-02-03 (×2): 2 mL

## 2018-02-03 MED ORDER — MIDAZOLAM HCL 2 MG/2ML IJ SOLN
INTRAMUSCULAR | Status: AC
  Filled 2018-02-03: qty 2

## 2018-02-03 MED ORDER — SODIUM CHLORIDE 0.9% FLUSH: 3.0000 mL | INTRAVENOUS | Status: DC | PRN

## 2018-02-03 MED ORDER — SODIUM CHLORIDE 0.9 % IV SOLN
INTRAVENOUS | Status: DC
  Administered 2018-02-03: 07:00:00 via INTRAVENOUS

## 2018-02-03 MED ORDER — FENTANYL CITRATE (PF) 100 MCG/2ML IJ SOLN
INTRAMUSCULAR | Status: DC | PRN
  Administered 2018-02-03: 25 ug via INTRAVENOUS

## 2018-02-03 MED ORDER — IOHEXOL 350 MG/ML SOLN
INTRAVENOUS | Status: DC | PRN
  Administered 2018-02-03: 90 mL via INTRAVENOUS

## 2018-02-03 MED ORDER — MIDAZOLAM HCL 2 MG/2ML IJ SOLN
INTRAMUSCULAR | Status: DC | PRN
  Administered 2018-02-03: 1 mg via INTRAVENOUS

## 2018-02-03 MED ORDER — LIDOCAINE HCL (PF) 1 % IJ SOLN
INTRAMUSCULAR | Status: AC
  Filled 2018-02-03: qty 30

## 2018-02-03 MED ORDER — VERAPAMIL HCL 2.5 MG/ML IV SOLN
INTRAVENOUS | Status: DC | PRN
  Administered 2018-02-03: 10 mL via INTRA_ARTERIAL

## 2018-02-03 MED ORDER — VERAPAMIL HCL 2.5 MG/ML IV SOLN
INTRAVENOUS | Status: AC
  Filled 2018-02-03: qty 2

## 2018-02-03 MED ORDER — HEPARIN (PORCINE) IN NACL 1000-0.9 UT/500ML-% IV SOLN
INTRAVENOUS | Status: AC
  Filled 2018-02-03: qty 1000

## 2018-02-03 MED ORDER — ASPIRIN 81 MG PO CHEW
81.0000 mg | CHEWABLE_TABLET | ORAL | Status: AC
  Administered 2018-02-03: 81 mg via ORAL
  Filled 2018-02-03: qty 1

## 2018-02-03 MED ORDER — RIVAROXABAN 20 MG PO TABS: 20.0000 mg | ORAL_TABLET | Freq: Every day | ORAL | 1 refills | Status: DC

## 2018-02-03 MED ORDER — DIGOXIN 125 MCG PO TABS: 125.0000 ug | ORAL_TABLET | Freq: Every day | ORAL | 11 refills | Status: DC

## 2018-02-03 MED ORDER — FENTANYL CITRATE (PF) 100 MCG/2ML IJ SOLN
INTRAMUSCULAR | Status: AC
  Filled 2018-02-03: qty 2

## 2018-02-03 MED ORDER — ONDANSETRON HCL 4 MG/2ML IJ SOLN: 4.0000 mg | Freq: Four times a day (QID) | INTRAMUSCULAR | Status: DC | PRN

## 2018-02-03 MED ORDER — HEPARIN (PORCINE) IN NACL 1000-0.9 UT/500ML-% IV SOLN
INTRAVENOUS | Status: DC | PRN
  Administered 2018-02-03 (×2): 500 mL

## 2018-02-03 MED ORDER — ACETAMINOPHEN 325 MG PO TABS: 650.0000 mg | ORAL_TABLET | ORAL | Status: DC | PRN

## 2018-02-03 SURGICAL SUPPLY — 13 items
CATH BALLN WEDGE 5F 110CM (CATHETERS) ×2 IMPLANT
CATH INFINITI 5FR MULTPACK ANG (CATHETERS) ×2 IMPLANT
DEVICE RAD COMP TR BAND LRG (VASCULAR PRODUCTS) ×2 IMPLANT
GLIDESHEATH SLEND SS 6F .021 (SHEATH) ×2 IMPLANT
GUIDEWIRE INQWIRE 1.5J.035X260 (WIRE) ×1 IMPLANT
INQWIRE 1.5J .035X260CM (WIRE) ×2
KIT HEART LEFT (KITS) ×2 IMPLANT
PACK CARDIAC CATHETERIZATION (CUSTOM PROCEDURE TRAY) ×2 IMPLANT
SHEATH GLIDE SLENDER 4/5FR (SHEATH) ×2 IMPLANT
SHEATH PINNACLE 5F 10CM (SHEATH) ×2 IMPLANT
TRANSDUCER W/STOPCOCK (MISCELLANEOUS) ×2 IMPLANT
TUBING CIL FLEX 10 FLL-RA (TUBING) ×2 IMPLANT
WIRE EMERALD 3MM-J .035X150CM (WIRE) ×2 IMPLANT

## 2018-02-03 NOTE — Discharge Instructions (Signed)
Radial Site Care °Refer to this sheet in the next few weeks. These instructions provide you with information about caring for yourself after your procedure. Your health care provider may also give you more specific instructions. Your treatment has been planned according to current medical practices, but problems sometimes occur. Call your health care provider if you have any problems or questions after your procedure. °What can I expect after the procedure? °After your procedure, it is typical to have the following: °· Bruising at the radial site that usually fades within 1-2 weeks. °· Blood collecting in the tissue (hematoma) that may be painful to the touch. It should usually decrease in size and tenderness within 1-2 weeks. ° °Follow these instructions at home: °· Take medicines only as directed by your health care provider. °· You may shower 24-48 hours after the procedure or as directed by your health care provider. Remove the bandage (dressing) and gently wash the site with plain soap and water. Pat the area dry with a clean towel. Do not rub the site, because this may cause bleeding. °· Do not take baths, swim, or use a hot tub until your health care provider approves. °· Check your insertion site every day for redness, swelling, or drainage. °· Do not apply powder or lotion to the site. °· Do not flex or bend the affected arm for 24 hours or as directed by your health care provider. °· Do not push or pull heavy objects with the affected arm for 24 hours or as directed by your health care provider. °· Do not lift over 10 lb (4.5 kg) for 5 days after your procedure or as directed by your health care provider. °· Ask your health care provider when it is okay to: °? Return to work or school. °? Resume usual physical activities or sports. °? Resume sexual activity. °· Do not drive home if you are discharged the same day as the procedure. Have someone else drive you. °· You may drive 24 hours after the procedure  unless otherwise instructed by your health care provider. °· Do not operate machinery or power tools for 24 hours after the procedure. °· If your procedure was done as an outpatient procedure, which means that you went home the same day as your procedure, a responsible adult should be with you for the first 24 hours after you arrive home. °· Keep all follow-up visits as directed by your health care provider. This is important. °Contact a health care provider if: °· You have a fever. °· You have chills. °· You have increased bleeding from the radial site. Hold pressure on the site. °Get help right away if: °· You have unusual pain at the radial site. °· You have redness, warmth, or swelling at the radial site. °· You have drainage (other than a small amount of blood on the dressing) from the radial site. °· The radial site is bleeding, and the bleeding does not stop after 30 minutes of holding steady pressure on the site. °· Your arm or hand becomes pale, cool, tingly, or numb. °This information is not intended to replace advice given to you by your health care provider. Make sure you discuss any questions you have with your health care provider. °Document Released: 04/19/2010 Document Revised: 08/23/2015 Document Reviewed: 10/03/2013 °Elsevier Interactive Patient Education © 2018 [employer] Inc[employer] ° ° ° °Moderate Conscious Sedation, Adult, Care After °These instructions provide you with information about caring for yourself after your procedure. Your health care provider   may also give you more specific instructions. Your treatment has been planned according to current medical practices, but problems sometimes occur. Call your health care provider if you have any problems or questions after your procedure. °What can I expect after the procedure? °After your procedure, it is common: °· To feel sleepy for several hours. °· To feel clumsy and have poor balance for several hours. °· To have poor judgment for several  hours. °· To vomit if you eat too soon. ° °Follow these instructions at home: °For at least 24 hours after the procedure: ° °· Do not: °? Participate in activities where you could fall or become injured. °? Drive. °? Use heavy machinery. °? Drink alcohol. °? Take sleeping pills or medicines that cause drowsiness. °? Make important decisions or sign legal documents. °? Take care of children on your own. °· Rest. °Eating and drinking °· Follow the diet recommended by your health care provider. °· If you vomit: °? Drink water, juice, or soup when you can drink without vomiting. °? Make sure you have little or no nausea before eating solid foods. °General instructions °· Have a responsible adult stay with you until you are awake and alert. °· Take over-the-counter and prescription medicines only as told by your health care provider. °· If you smoke, do not smoke without supervision. °· Keep all follow-up visits as told by your health care provider. This is important. °Contact a health care provider if: °· You keep feeling nauseous or you keep vomiting. °· You feel light-headed. °· You develop a rash. °· You have a fever. °Get help right away if: °· You have trouble breathing. °This information is not intended to replace advice given to you by your health care provider. Make sure you discuss any questions you have with your health care provider. °Document Released: 01/05/2013 Document Revised: 08/20/2015 Document Reviewed: 07/07/2015 °Elsevier Interactive Patient Education © 2018 Elsevier Inc. ° °

## 2018-02-03 NOTE — Interval H&P Note (Signed)
History and Physical Interval Note:  02/03/2018 8:49 AM  John Davenport  has presented today for surgery, with the diagnosis of cad, chf  The various methods of treatment have been discussed with the patient and family. After consideration of risks, benefits and other options for treatment, the patient has consented to  Procedure(s): RIGHT/LEFT HEART CATH AND CORONARY ANGIOGRAPHY (N/A) as a surgical intervention .  The patient's history has been reviewed, patient examined, no change in status, stable for surgery.  I have reviewed the patient's chart and labs.  Questions were answered to the patient's satisfaction.     [name] [employer]

## 2018-02-03 NOTE — Progress Notes (Signed)
Called client's girlfriend and notified her that client needs to resume xarelto tomorrow and she voiced understanding

## 2018-02-10 NOTE — Addendum Note (Signed)
Encounter addended by: Shirley Friar, PA-C on: 02/10/2018 10:28 AM  Actions taken: LOS modified

## 2018-02-17 ENCOUNTER — Encounter (HOSPITAL_COMMUNITY): Payer: Self-pay

## 2018-02-17 ENCOUNTER — Ambulatory Visit (HOSPITAL_COMMUNITY)
Admission: RE | Admit: 2018-02-17 | Discharge: 2018-02-17 | Disposition: A | Discharge status: Home / Self Care | Payer: Medicare Other | Source: Ambulatory Visit | Attending: Internal Medicine | Admitting: Internal Medicine

## 2018-02-17 VITALS — BP 80/60 | HR 76 | Wt 169.2 lb

## 2018-02-17 DIAGNOSIS — I5042 Chronic combined systolic (congestive) and diastolic (congestive) heart failure: Secondary | ICD-10-CM | POA: Diagnosis not present

## 2018-02-17 DIAGNOSIS — G4733 Obstructive sleep apnea (adult) (pediatric): Secondary | ICD-10-CM | POA: Diagnosis not present

## 2018-02-17 DIAGNOSIS — I6340 Cerebral infarction due to embolism of unspecified cerebral artery: Secondary | ICD-10-CM

## 2018-02-17 DIAGNOSIS — Z951 Presence of aortocoronary bypass graft: Secondary | ICD-10-CM | POA: Insufficient documentation

## 2018-02-17 DIAGNOSIS — I5022 Chronic systolic (congestive) heart failure: Secondary | ICD-10-CM | POA: Insufficient documentation

## 2018-02-17 DIAGNOSIS — Z8673 Personal history of transient ischemic attack (TIA), and cerebral infarction without residual deficits: Secondary | ICD-10-CM | POA: Diagnosis not present

## 2018-02-17 DIAGNOSIS — J449 Chronic obstructive pulmonary disease, unspecified: Secondary | ICD-10-CM | POA: Diagnosis not present

## 2018-02-17 DIAGNOSIS — Z720 Tobacco use: Secondary | ICD-10-CM

## 2018-02-17 DIAGNOSIS — I472 Ventricular tachycardia, unspecified: Secondary | ICD-10-CM

## 2018-02-17 DIAGNOSIS — K219 Gastro-esophageal reflux disease without esophagitis: Secondary | ICD-10-CM | POA: Insufficient documentation

## 2018-02-17 DIAGNOSIS — Z7901 Long term (current) use of anticoagulants: Secondary | ICD-10-CM | POA: Diagnosis not present

## 2018-02-17 DIAGNOSIS — I214 Non-ST elevation (NSTEMI) myocardial infarction: Secondary | ICD-10-CM

## 2018-02-17 DIAGNOSIS — I255 Ischemic cardiomyopathy: Secondary | ICD-10-CM | POA: Insufficient documentation

## 2018-02-17 DIAGNOSIS — Z79899 Other long term (current) drug therapy: Secondary | ICD-10-CM | POA: Diagnosis not present

## 2018-02-17 DIAGNOSIS — I2510 Atherosclerotic heart disease of native coronary artery without angina pectoris: Secondary | ICD-10-CM

## 2018-02-17 LAB — BASIC METABOLIC PANEL
ANION GAP: 8 (ref 5–15)
BUN: 27 mg/dL — AB (ref 6–20)
CHLORIDE: 98 mmol/L (ref 98–111)
CO2: 30 mmol/L (ref 22–32)
Calcium: 9.3 mg/dL (ref 8.9–10.3)
Creatinine, Ser: 1.42 mg/dL — ABNORMAL HIGH (ref 0.61–1.24)
GFR calc Af Amer: >60 mL/min (ref >60)
GFR calc non Af Amer: 59 mL/min — ABNORMAL LOW (ref >60)
GLUCOSE: 85 mg/dL (ref 70–99)
POTASSIUM: 4.8 mmol/L (ref 3.5–5.1)
Sodium: 136 mmol/L (ref 135–145)

## 2018-02-17 LAB — MAGNESIUM: Magnesium: 2.2 mg/dL (ref 1.7–2.4)

## 2018-02-17 MED ORDER — LOSARTAN POTASSIUM 25 MG PO TABS: 25.0000 mg | ORAL_TABLET | Freq: Two times a day (BID) | ORAL | 3 refills | Status: DC

## 2018-02-17 MED ORDER — AMIODARONE HCL 200 MG PO TABS: 200.0000 mg | ORAL_TABLET | Freq: Two times a day (BID) | ORAL | 3 refills | Status: DC

## 2018-02-17 NOTE — Progress Notes (Signed)
Advanced Heart Failure Clinic Note  Cardiology: Dr. Almetta Lovely is a 41 y.o. male with history of CAD s/p CABG, chronic systolic CHF (ischemic cardiomyopathy), and COPD presents for CHF clinic evaluation.  He had his first MI with occlusion of LAD in 10/9 treated with BMS.  He then had POBA of reocclusion of LAD stent in 6/10. Finally, he had sequential LIMA-D1 and D2 in 1/11.  NSTEMI in 6/17 (cocaine positive) showed 99% stenosis small distal LCx as likely culprit. This was medically managed. Echo in 6/17 showed EF 20% with wall motion abnormalities.   He was admitted in 12/17 with RUQ abdominal pain.  There was concern for acute cholecystitis based on CT of abdomen, but HIDA scan was negative. He left AMA.    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  UDS was positive for cocaine. CTA showed occlusion of left M2 segment of MCA.  He had tPA and mechanical thrombectomy.  He had complete recovery with resolution of neurologic symptoms.  Suspicion for cardio-embolic cause (LV thrombus), so warfarin was started. He was taken off of most of his cardiac meds due to low BP, these have subsequently been restarted.   He presents today for post cath follow up. Cath shows stable CAD with no interventional targets. Lasix increased and he states his breathing feels much better. He has been lightheaded and dizzy. BP low on arrival, but orthostatics negative. Of note, he has NOT taken his medications this am. Continues to smoke 1 ppd. No further CP with diuresis. Weight down 11 lbs since visit prior to cath. He is now on Xarelto. Denies bleeding. No orthopnea or PND. He states he is taking all medications as directed.   L/RHC 02/03/18 - Patent RCA and sequential LIMA- D1/D2. LCx system disease actually does not look as significant as on the cath 2 years ago. No interventional target.  Hemodynamics RA mean 8 RV 45/12 PA 55/20, mean 33 PCWP mean 20 LV 102/32 AO  104/66 Oxygen saturations: PA 60% AO 98% Cardiac Output (Fick) 4  Cardiac Index (Fick) 1.91 PVR 3.2 WU  ECG (12/17): NSR, IVCD 139 msec  Labs (6/17): K 3.8, creatinine 1.26, HCT 42.4, LDL 81, HDL 34  Labs (10/17): K 4, creatinine 1.42, LDL 122, BNP 475 Labs (12/17): K 4, creatinine 1.39, hgb 13.6 Labs (2/18): K 4.2, creatinine 1.17, LDL 78, hgb 15.3 Labs (4/18): K 3.9, creatinine 1.2, LDL 91, HDL 40 Labs (5/18): K 3.9, creatinine 1.27  PMH: 1. GERD 2. OSA 3. H/o VT 4. COPD: Active smoker. 5. Chronic systolic CHF: Ischemic cardiomyopathy. Has [employer] ICD.  - Echo (6/17): EF 20%, wall motion abnormalities, mild MR - CPX (11/17): peak VO2 19.7 (51% predicted), VE/VCO2 slope 46.9, RER 0.92 => Submaximal, but probably moderate to severe functional impairment => suspect restrictive/obstructive lung disease and HF play a role in the abnormality, probably moderate impairment in terms of HF.  - Echo (2/18): EF 15-20%, severe LV dilation, mildly decreased RV systolic function, PASP 45 mmHg.  6. CAD: Acute MI in 10/09 with BMS to LAD - POBA to LAD stent in 6/10.  - CABG with sequential LIMA-D1 and D2 in 1/11. - NSTEMI 6/17: LHC with patent sequential LIMA-D1 and D2 with filling of LAD, occlusion proximal LAD, subtotal occlusion of distal LCx into small OM (likely culprit).  No PCI. He was cocaine positive.  7. Symptomatic cholelithiasis: Negative HIDA scan 12/17.  8. CVA (2/18): Occlusion left  MCA, treated with tPA and thrombectomy with complete recovery.  Thought to be due to cardio-embolism (LV thrombus).   SH: Disabled, active smoker (down to 1/2 pack every 2 wks), history of cocaine use, no ETOH, lives in [place].  FH: Father with 1st MI in his 71s.  Review of systems complete and found to be negative unless listed in HPI.    Current Outpatient Medications  Medication Sig Dispense Refill  . carvedilol (COREG) 6.25 MG tablet Take 1 tablet (6.25 mg total) by mouth 2 (two)  times daily. 60 tablet 6  . digoxin (LANOXIN) 0.125 MG tablet Take 1 tablet (125 mcg total) by mouth daily. 30 tablet 11  . furosemide (LASIX) 40 MG tablet Take 1 tablet (40 mg total) by mouth 2 (two) times daily. 90 tablet 3  . potassium chloride SA (K-DUR,KLOR-CON) 20 MEQ tablet Take 1 tablet (20 mEq total) by mouth daily. 90 tablet 3  . rivaroxaban (XARELTO) 20 MG TABS tablet Take 1 tablet (20 mg total) by mouth daily with supper. Restart on Thursday morning. 30 tablet 1  . spironolactone (ALDACTONE) 25 MG tablet Take 1 tablet (25 mg total) by mouth at bedtime. 30 tablet 3  . amiodarone (PACERONE) 200 MG tablet Take 1 tablet (200 mg total) by mouth 2 (two) times daily. 60 tablet 3  . [START ON 02/18/2018] losartan (COZAAR) 25 MG tablet Take 1 tablet (25 mg total) by mouth 2 (two) times daily. 60 tablet 3  . nitroGLYCERIN (NITROSTAT) 0.4 MG SL tablet Place 1 tablet (0.4 mg total) under the tongue every 5 (five) minutes as needed for chest pain. (Patient not taking: Reported on 02/17/2018) 100 tablet 0   No current facility-administered medications for this encounter.    Vitals:   02/17/18 0911 02/17/18 0920 02/17/18 0921  BP: (!) 92/56 (!) 82/60 (!) 80/60  Pulse: 76    SpO2: 100%    Weight: 76.7 kg (169 lb 3.2 oz)      Wt Readings from Last 3 Encounters:  02/17/18 76.7 kg (169 lb 3.2 oz)  02/03/18 81.6 kg (180 lb)  01/27/18 82.1 kg (181 lb)    Physical Exam  General: Well appearing. Thin. Disheveled. Smells strongly of tobacco.  HEENT: Normal, poor dentition. Neck: Supple. JVP 6-7 cm. Carotids 2+ bilat; no bruits. No thyromegaly or nodule noted. Cor: PMI nondisplaced. RRR, No M/G/R noted Lungs: CTAB, normal effort. Abdomen: Soft, non-tender, non-distended, no HSM. No bruits or masses. +BS  Extremities: No cyanosis, clubbing, or rash. R and LLE no edema.  Neuro: Alert & orientedx3, cranial nerves grossly intact. moves all 4 extremities w/o difficulty. Affect pleasant    Assessment/Plan: 1. CAD: s/p CABG.   - 6/17 NSTEMI with subtotal occlusion small distal LCx managed medically.   - L/RHC 02/03/18 patent RCA and sequential LIMA- D1/D2. LCx system disease actually does not look as significant as on the cath 2 years ago. No interventional target.  - Continue ASA 81.   - Continue Crestor 40 mg daily. LDL 68 09/2016. Needs Lipids/LFTs. 2. Chronic systolic CHF: Ischemic CMP.   - s/p [employer] ICD.  EF 15-20% on 2/18 echo.  COPD likely also plays a role in his dyspnea. Submaximal CPX in 11/17 but suggestive of both heart and lung limitation => appears to have moderate limitation in terms of heart failure.  - NYHA II-III symptoms now with diuresis.  - Volume status stable on exam.  - He reports he HAS been taking his medications.   -  Continue Lasix 40 mg BID and KCl 20 daily.  BMET today.  - Continue spironolactone 25 mg daily. Hold today.  - Stop Entresto. Start losartan 25 mg BID starting tomorrow.  - Continue Coreg 6.25 mg BID. Hold today.  - Reinforced fluid restriction to < 2 L daily, sodium restriction to less than 2000 mg daily, and the importance of daily weights.   3. Tobacco abuse - Continued to encourage complete cessation.  4. CVA:  - Possibly cardio-embolic, LV thrombus.  - Continue Xarelto 20 mg daily.  5. VT - Noted on ICD interrogation. Asymptomatic. No shocks.  - Interrogation arrived to provider after patient had left. Discussed with Dr. Aundra Dubin.  - Start amiodarone 200 mg BID as previously ordered. Sorted out by pharmacy.   RTC 2 weeks for pharmacy visit. MD visit 2 months, sooner with symptoms. He may be nearing need for advanced therapy with poor tolerance to medicines and low cardiac output on cath, but may be a poor candidate with poor compliance and follow-up. Consider CPX. Will discuss at pharmacy visit in 2 weeks (left note in encounter).  Shirley Friar, PA-C  02/17/2018  Greater than 50% of the 25 minute visit was  spent in counseling/coordination of care regarding disease state education, salt/fluid restriction, sliding scale diuretics, and medication compliance.

## 2018-02-17 NOTE — Addendum Note (Signed)
Encounter addended by: Kerry Dory, CMA on: 02/17/2018 10:42 AM  Actions taken: Charge Capture section accepted

## 2018-02-17 NOTE — Patient Instructions (Signed)
STOP Entresto START Losartan 25 mg, one tab twice a day (starting 02/18/2018) START Amiodarone 200 mg ,one tab twice a day  HOLD Coreg and Spironolactone today and restart normal doses 02/18/18  Labs today We will only contact you if something comes back abnormal or we need to make some changes. Otherwise no news is good news!  Your physician recommends that you schedule a follow-up appointment in: 2 weeks with the CHF pharmacy team  Your physician recommends that you schedule a follow-up appointment in: 2 months with Dr Aundra Dubin   Do the following things EVERYDAY: 1) Weigh yourself in the morning before breakfast. Write it down and keep it in a log. 2) Take your medicines as prescribed 3) Eat low salt foods-Limit salt (sodium) to 2000 mg per day.  4) Stay as active as you can everyday 5) Limit all fluids for the day to less than 2 liters

## 2018-02-24 ENCOUNTER — Encounter (HOSPITAL_COMMUNITY): Payer: Self-pay | Admitting: *Deleted

## 2018-03-03 ENCOUNTER — Inpatient Hospital Stay (HOSPITAL_COMMUNITY)
Admission: RE | Admit: 2018-03-03 | Discharge: 2018-03-03 | Disposition: A | Discharge status: Home / Self Care | Payer: Medicare Other | Source: Ambulatory Visit

## 2018-03-21 LAB — CUP PACEART REMOTE DEVICE CHECK
Implantable Lead Implant Date: 20101005
Implantable Lead Location: 753860
Implantable Lead Model: 4136
Implantable Lead Serial Number: 28628809
Implantable Pulse Generator Implant Date: 20101005
MDC IDC LEAD IMPLANT DT: 20101005
MDC IDC LEAD LOCATION: 753859
MDC IDC LEAD SERIAL: 300495
MDC IDC PG SERIAL: 152061
MDC IDC SESS DTM: 20191222203426

## 2018-04-06 ENCOUNTER — Other Ambulatory Visit: Payer: Self-pay | Admitting: Internal Medicine

## 2018-04-20 ENCOUNTER — Ambulatory Visit (INDEPENDENT_AMBULATORY_CARE_PROVIDER_SITE_OTHER): Payer: Medicare Other

## 2018-04-20 DIAGNOSIS — I255 Ischemic cardiomyopathy: Secondary | ICD-10-CM

## 2018-04-20 DIAGNOSIS — I5042 Chronic combined systolic (congestive) and diastolic (congestive) heart failure: Secondary | ICD-10-CM

## 2018-04-21 NOTE — Progress Notes (Signed)
Remote ICD transmission.   

## 2018-04-22 LAB — CUP PACEART REMOTE DEVICE CHECK
Battery Remaining Percentage: 34 %
Brady Statistic RV Percent Paced: 0 %
HIGH POWER IMPEDANCE MEASURED VALUE: 55 Ohm
Implantable Lead Implant Date: 20101005
Implantable Lead Model: 4136
Implantable Lead Serial Number: 300495
Implantable Pulse Generator Implant Date: 20101005
Lead Channel Impedance Value: 535 Ohm
Lead Channel Pacing Threshold Amplitude: 0.7 V
Lead Channel Pacing Threshold Amplitude: 1.2 V
Lead Channel Pacing Threshold Pulse Width: 0.4 ms
Lead Channel Setting Pacing Amplitude: 2 V
Lead Channel Setting Pacing Pulse Width: 0.4 ms
Lead Channel Setting Sensing Sensitivity: 0.6 mV
MDC IDC LEAD IMPLANT DT: 20101005
MDC IDC LEAD LOCATION: 753859
MDC IDC LEAD LOCATION: 753860
MDC IDC LEAD SERIAL: 28628809
MDC IDC MSMT BATTERY REMAINING LONGEVITY: 30 mo
MDC IDC MSMT LEADCHNL RA PACING THRESHOLD PULSEWIDTH: 0.4 ms
MDC IDC MSMT LEADCHNL RV IMPEDANCE VALUE: 411 Ohm
MDC IDC PG SERIAL: 152061
MDC IDC SESS DTM: 20200121075100
MDC IDC SET LEADCHNL RV PACING AMPLITUDE: 2.8 V
MDC IDC STAT BRADY RA PERCENT PACED: 0 %

## 2018-04-23 ENCOUNTER — Encounter: Payer: Self-pay | Admitting: Cardiology

## 2018-04-29 ENCOUNTER — Ambulatory Visit (HOSPITAL_COMMUNITY): Admission: RE | Admit: 2018-04-29 | Payer: Medicare Other | Source: Ambulatory Visit | Admitting: Cardiology

## 2018-06-23 ENCOUNTER — Telehealth (HOSPITAL_COMMUNITY): Payer: Self-pay | Admitting: Cardiology

## 2018-06-23 NOTE — Telephone Encounter (Signed)
Left message for patient to return my call.  Need to reschedule his 06/29/2018 appt with Dr. Aundra Dubin d/t Covid-19.

## 2018-06-29 ENCOUNTER — Encounter (HOSPITAL_COMMUNITY): Payer: Medicare Other | Admitting: Cardiology

## 2018-07-11 ENCOUNTER — Emergency Department (HOSPITAL_COMMUNITY): Payer: Medicare Other

## 2018-07-11 ENCOUNTER — Encounter (HOSPITAL_COMMUNITY): Payer: Self-pay | Admitting: Emergency Medicine

## 2018-07-11 ENCOUNTER — Observation Stay (HOSPITAL_BASED_OUTPATIENT_CLINIC_OR_DEPARTMENT_OTHER)
Admission: EM | Admit: 2018-07-11 | Discharge: 2018-07-12 | Disposition: A | Discharge status: Home / Self Care | Payer: Medicare Other | Source: Home / Self Care | Attending: Emergency Medicine | Admitting: Emergency Medicine

## 2018-07-11 ENCOUNTER — Other Ambulatory Visit: Payer: Self-pay

## 2018-07-11 DIAGNOSIS — T827XXA Infection and inflammatory reaction due to other cardiac and vascular devices, implants and grafts, initial encounter: Secondary | ICD-10-CM | POA: Diagnosis not present

## 2018-07-11 DIAGNOSIS — R509 Fever, unspecified: Secondary | ICD-10-CM | POA: Diagnosis not present

## 2018-07-11 DIAGNOSIS — T782XXA Anaphylactic shock, unspecified, initial encounter: Secondary | ICD-10-CM | POA: Diagnosis present

## 2018-07-11 DIAGNOSIS — R0789 Other chest pain: Secondary | ICD-10-CM | POA: Insufficient documentation

## 2018-07-11 DIAGNOSIS — Z85118 Personal history of other malignant neoplasm of bronchus and lung: Secondary | ICD-10-CM

## 2018-07-11 DIAGNOSIS — F17210 Nicotine dependence, cigarettes, uncomplicated: Secondary | ICD-10-CM | POA: Insufficient documentation

## 2018-07-11 DIAGNOSIS — Z7901 Long term (current) use of anticoagulants: Secondary | ICD-10-CM | POA: Insufficient documentation

## 2018-07-11 DIAGNOSIS — I252 Old myocardial infarction: Secondary | ICD-10-CM

## 2018-07-11 DIAGNOSIS — F419 Anxiety disorder, unspecified: Secondary | ICD-10-CM

## 2018-07-11 DIAGNOSIS — I2510 Atherosclerotic heart disease of native coronary artery without angina pectoris: Secondary | ICD-10-CM

## 2018-07-11 DIAGNOSIS — Z79899 Other long term (current) drug therapy: Secondary | ICD-10-CM | POA: Insufficient documentation

## 2018-07-11 DIAGNOSIS — R000 Tachycardia, unspecified: Secondary | ICD-10-CM | POA: Diagnosis not present

## 2018-07-11 DIAGNOSIS — R7881 Bacteremia: Secondary | ICD-10-CM | POA: Diagnosis present

## 2018-07-11 DIAGNOSIS — I130 Hypertensive heart and chronic kidney disease with heart failure and stage 1 through stage 4 chronic kidney disease, or unspecified chronic kidney disease: Secondary | ICD-10-CM

## 2018-07-11 DIAGNOSIS — R079 Chest pain, unspecified: Secondary | ICD-10-CM | POA: Diagnosis not present

## 2018-07-11 DIAGNOSIS — Z95810 Presence of automatic (implantable) cardiac defibrillator: Secondary | ICD-10-CM

## 2018-07-11 DIAGNOSIS — N179 Acute kidney failure, unspecified: Secondary | ICD-10-CM | POA: Insufficient documentation

## 2018-07-11 DIAGNOSIS — Z951 Presence of aortocoronary bypass graft: Secondary | ICD-10-CM

## 2018-07-11 DIAGNOSIS — L509 Urticaria, unspecified: Secondary | ICD-10-CM | POA: Insufficient documentation

## 2018-07-11 DIAGNOSIS — I5022 Chronic systolic (congestive) heart failure: Secondary | ICD-10-CM

## 2018-07-11 DIAGNOSIS — A4101 Sepsis due to Methicillin susceptible Staphylococcus aureus: Secondary | ICD-10-CM | POA: Diagnosis not present

## 2018-07-11 DIAGNOSIS — I959 Hypotension, unspecified: Secondary | ICD-10-CM | POA: Insufficient documentation

## 2018-07-11 DIAGNOSIS — Z8673 Personal history of transient ischemic attack (TIA), and cerebral infarction without residual deficits: Secondary | ICD-10-CM | POA: Insufficient documentation

## 2018-07-11 DIAGNOSIS — F1410 Cocaine abuse, uncomplicated: Secondary | ICD-10-CM

## 2018-07-11 DIAGNOSIS — N183 Chronic kidney disease, stage 3 (moderate): Secondary | ICD-10-CM

## 2018-07-11 DIAGNOSIS — B9561 Methicillin susceptible Staphylococcus aureus infection as the cause of diseases classified elsewhere: Secondary | ICD-10-CM | POA: Diagnosis not present

## 2018-07-11 LAB — HEPATIC FUNCTION PANEL
ALT: 36 U/L (ref 0–44)
AST: 42 U/L — ABNORMAL HIGH (ref 15–41)
Albumin: 2.9 g/dL — ABNORMAL LOW (ref 3.5–5.0)
Alkaline Phosphatase: 65 U/L (ref 38–126)
Bilirubin, Direct: 0.4 mg/dL — ABNORMAL HIGH (ref 0.0–0.2)
Indirect Bilirubin: 0.8 mg/dL (ref 0.3–0.9)
Total Bilirubin: 1.2 mg/dL (ref 0.3–1.2)
Total Protein: 7.1 g/dL (ref 6.5–8.1)

## 2018-07-11 LAB — TROPONIN I: Troponin I: <0.03 ng/mL (ref <0.03)

## 2018-07-11 LAB — CBC WITH DIFFERENTIAL/PLATELET
Abs Immature Granulocytes: 0.04 10*3/uL (ref 0.00–0.07)
Basophils Absolute: 0 10*3/uL (ref 0.0–0.1)
Basophils Relative: 0 %
Eosinophils Absolute: 0 10*3/uL (ref 0.0–0.5)
Eosinophils Relative: 0 %
HCT: 44.6 % (ref 39.0–52.0)
Hemoglobin: 14.4 g/dL (ref 13.0–17.0)
Immature Granulocytes: 0 %
Lymphocytes Relative: 5 %
Lymphs Abs: 0.5 10*3/uL — ABNORMAL LOW (ref 0.7–4.0)
MCH: 30.9 pg (ref 26.0–34.0)
MCHC: 32.3 g/dL (ref 30.0–36.0)
MCV: 95.7 fL (ref 80.0–100.0)
Monocytes Absolute: 0.2 10*3/uL (ref 0.1–1.0)
Monocytes Relative: 1 %
Neutro Abs: 10 10*3/uL — ABNORMAL HIGH (ref 1.7–7.7)
Neutrophils Relative %: 94 %
Platelets: 147 10*3/uL — ABNORMAL LOW (ref 150–400)
RBC: 4.66 MIL/uL (ref 4.22–5.81)
RDW: 14.5 % (ref 11.5–15.5)
WBC: 10.8 10*3/uL — ABNORMAL HIGH (ref 4.0–10.5)
nRBC: 0 % (ref 0.0–0.2)

## 2018-07-11 LAB — BASIC METABOLIC PANEL
Anion gap: 12 (ref 5–15)
BUN: 16 mg/dL (ref 6–20)
CO2: 22 mmol/L (ref 22–32)
Calcium: 8.8 mg/dL — ABNORMAL LOW (ref 8.9–10.3)
Chloride: 98 mmol/L (ref 98–111)
Creatinine, Ser: 1.69 mg/dL — ABNORMAL HIGH (ref 0.61–1.24)
GFR calc Af Amer: 56 mL/min — ABNORMAL LOW (ref >60)
GFR calc non Af Amer: 48 mL/min — ABNORMAL LOW (ref >60)
Glucose, Bld: 102 mg/dL — ABNORMAL HIGH (ref 70–99)
Potassium: 3.7 mmol/L (ref 3.5–5.1)
Sodium: 132 mmol/L — ABNORMAL LOW (ref 135–145)

## 2018-07-11 LAB — DIGOXIN LEVEL: Digoxin Level: <0.2 ng/mL — ABNORMAL LOW (ref 0.8–2.0)

## 2018-07-11 LAB — LIPASE, BLOOD: Lipase: 22 U/L (ref 11–51)

## 2018-07-11 LAB — ETHANOL: Alcohol, Ethyl (B): <10 mg/dL (ref <10)

## 2018-07-11 MED ORDER — EPINEPHRINE 0.3 MG/0.3ML IJ SOAJ
0.3000 mg | Freq: Once | INTRAMUSCULAR | Status: AC
  Administered 2018-07-11: 19:00:00 0.3 mg via INTRAMUSCULAR
  Filled 2018-07-11: qty 0.3

## 2018-07-11 MED ORDER — FAMOTIDINE 20 MG IN NS 100 ML IVPB
20.0000 mg | Freq: Once | INTRAVENOUS | Status: AC
  Administered 2018-07-11: 19:00:00 20 mg via INTRAVENOUS
  Filled 2018-07-11: qty 100

## 2018-07-11 MED ORDER — RIVAROXABAN 20 MG PO TABS: 20.0000 mg | ORAL_TABLET | Freq: Every day | ORAL | Status: DC

## 2018-07-11 MED ORDER — SODIUM CHLORIDE 0.9 % IV SOLN
INTRAVENOUS | Status: AC
  Administered 2018-07-12: via INTRAVENOUS

## 2018-07-11 MED ORDER — METHYLPREDNISOLONE SODIUM SUCC 125 MG IJ SOLR
125.0000 mg | Freq: Once | INTRAMUSCULAR | Status: AC
  Administered 2018-07-11: 125 mg via INTRAVENOUS
  Filled 2018-07-11: qty 2

## 2018-07-11 MED ORDER — DIPHENHYDRAMINE HCL 50 MG/ML IJ SOLN
25.0000 mg | Freq: Once | INTRAMUSCULAR | Status: AC
  Administered 2018-07-11: 19:00:00 25 mg via INTRAVENOUS
  Filled 2018-07-11: qty 1

## 2018-07-11 NOTE — ED Notes (Signed)
ED TO INPATIENT HANDOFF REPORT  ED Nurse Name and Phone #:  Callie Fielding [phone]  S Name/Age/Gender John Davenport 43 y.o. male Room/Bed: 029C/029C  Code Status   Code Status: Full Code  Home/SNF/Other Home Patient oriented to: self, place, time and situation Is this baseline? Yes   Triage Complete: Triage complete  Chief Complaint chest pain/hives  Triage Note Pt here from home. Pt complains of chest pain and hives. Pt states it started at 4am this morning   Allergies No Known Allergies  Level of Care/Admitting Diagnosis ED Disposition    ED Disposition Condition [facility] Area: [place] [100100]  Level of Care: Telemetry Medical [104]  Diagnosis: Chest pain [053976]  Admitting Physician: Oda Kilts [[id number]]  Attending Physician: Oda Kilts [[id number]]  Estimated length of stay: past midnight tomorrow  Certification:: I certify this patient will need inpatient services for at least 2 midnights  PT Class (Do Not Modify): Inpatient [101]  PT Acc Code (Do Not Modify): Private [1]       B Medical/Surgery History Past Medical History:  Diagnosis Date  . Anxiety state, unspecified   . Back pain   . Cancer ([place])    Lung CA  . Chronic systolic heart failure ([place])    a. Ischemic CM (EF 30% by echo in 2011) b. echo 08/2015: EF 20% with severe HK and AK of the inferoseptal and apical myocardium.  . Coronary artery disease    a. 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  b. eventually required single vessel CABG (L-LAD) in 03/2009 due to restenosis. c. cath 2013 showed patent LIMA sequential to the diags with LAD filling retrograde, no new disease in RCA and Cx, low LVEDP, sx felt noncardiac. d. 08/2015: NSTEMI: CTO of prox LAD, 99% stenosis of small dCx (no stent), + for cocaine  . Esophageal reflux   . Heart attack ([place])   . Hypercholesteremia   . ICD (implantable  cardioverter-defibrillator) infection ([place])    a. [employer] ICD 12/2008.   . Ischemic cardiomyopathy    echocardiogram 12/11: Mild LVH, EF 30-35%, anteroseptal and apical akinesis, grade 1 diastolic dysfunction  . LBP (low back pain)   . OSA (obstructive sleep apnea) 12/30/2010  . Paroxysmal ventricular tachycardia ([place])   . Stroke ([place])   . Tobacco abuse    Past Surgical History:  Procedure Laterality Date  . CARDIAC CATHETERIZATION N/A 09/06/2015   Procedure: Left Heart Cath and Cors/Grafts Angiography;  Surgeon: Sherren Mocha, MD;  Location: [facility];  Service: Cardiovascular;  Laterality: N/A;  . CARDIAC DEFIBRILLATOR PLACEMENT    . CARDIAC DEFIBRILLATOR PLACEMENT     2010 by JA  . CORONARY ANGIOPLASTY WITH STENT PLACEMENT     LAD stenting 10/09 and re-opening 6/10  . CORONARY ARTERY BYPASS GRAFT     1/11  . IR GENERIC HISTORICAL  05/21/2016   IR PERCUTANEOUS ART THROMBECTOMY/INFUSION INTRACRANIAL INC DIAG ANGIO 05/21/2016 Luanne Bras, MD [facility]  . IR GENERIC HISTORICAL  06/16/2016   IR RADIOLOGIST EVAL & MGMT 06/16/2016 [facility]  . LEFT HEART CATHETERIZATION WITH CORONARY ANGIOGRAM N/A 08/07/2011   Procedure: LEFT HEART CATHETERIZATION WITH CORONARY ANGIOGRAM;  Surgeon: Josue Hector, MD;  Location: [facility];  Service: Cardiovascular;  Laterality: N/A;  . RADIOLOGY WITH ANESTHESIA N/A 05/21/2016   Procedure: RADIOLOGY WITH ANESTHESIA;  Surgeon: Luanne Bras, MD;  Location:  [facility] OR;  Service: Radiology;  Laterality: N/A;  . RIGHT/LEFT HEART CATH AND CORONARY ANGIOGRAPHY N/A 02/03/2018   Procedure: RIGHT/LEFT HEART CATH AND CORONARY ANGIOGRAPHY;  Surgeon: Larey Dresser, MD;  Location: [facility];  Service: Cardiovascular;  Laterality: N/A;     A IV Location/Drains/Wounds Patient Lines/Drains/Airways Status   Active Line/Drains/Airways    Name:   Placement date:   Placement time:   Site:   Days:   Peripheral IV 05/21/16 Left Antecubital    05/21/16    -    Antecubital   781   Peripheral IV 05/21/16 Right Hand   05/21/16    1836    Hand   781   Peripheral IV 07/11/18 Right Antecubital   07/11/18    1838    Antecubital   less than 1          Intake/Output Last 24 hours No intake or output data in the 24 hours ending 07/11/18 2346  Labs/Imaging Results for orders placed or performed during the hospital encounter of 07/11/18 (from the past 48 hour(s))  CBC with Differential     Status: Abnormal   Collection Time: 07/11/18  6:41 PM  Result Value Ref Range   WBC 10.8 (H) 4.0 - 10.5 K/uL   RBC 4.66 4.22 - 5.81 MIL/uL   Hemoglobin 14.4 13.0 - 17.0 g/dL   HCT 44.6 39.0 - 52.0 %   MCV 95.7 80.0 - 100.0 fL   MCH 30.9 26.0 - 34.0 pg   MCHC 32.3 30.0 - 36.0 g/dL   RDW 14.5 11.5 - 15.5 %   Platelets 147 (L) 150 - 400 K/uL   nRBC 0.0 0.0 - 0.2 %   Neutrophils Relative % 94 %   Neutro Abs 10.0 (H) 1.7 - 7.7 K/uL   Lymphocytes Relative 5 %   Lymphs Abs 0.5 (L) 0.7 - 4.0 K/uL   Monocytes Relative 1 %   Monocytes Absolute 0.2 0.1 - 1.0 K/uL   Eosinophils Relative 0 %   Eosinophils Absolute 0.0 0.0 - 0.5 K/uL   Basophils Relative 0 %   Basophils Absolute 0.0 0.0 - 0.1 K/uL   Immature Granulocytes 0 %   Abs Immature Granulocytes 0.04 0.00 - 0.07 K/uL    Comment: Performed at [facility], [address] [place], [place] [address]  Basic metabolic panel     Status: Abnormal   Collection Time: 07/11/18  6:41 PM  Result Value Ref Range   Sodium 132 (L) 135 - 145 mmol/L   Potassium 3.7 3.5 - 5.1 mmol/L   Chloride 98 98 - 111 mmol/L   CO2 22 22 - 32 mmol/L   Glucose, Bld 102 (H) 70 - 99 mg/dL   BUN 16 6 - 20 mg/dL   Creatinine, Ser 1.69 (H) 0.61 - 1.24 mg/dL   Calcium 8.8 (L) 8.9 - 10.3 mg/dL   GFR calc non Af Amer 48 (L) >60 mL/min   GFR calc Af Amer 56 (L) >60 mL/min   Anion gap 12 5 - 15    Comment: Performed at [facility] [address], [place], [place] [address]  Ethanol     Status: None   Collection  Time: 07/11/18  6:41 PM  Result Value Ref Range   Alcohol, Ethyl (B) <10 <10 mg/dL    Comment: (NOTE) Lowest detectable limit for serum alcohol is 10 mg/dL. For medical purposes only. Performed at [facility], [place] [address]  [place], [place] [address]   Hepatic function panel     Status: Abnormal   Collection Time: 07/11/18  7:12 PM  Result Value Ref Range   Total Protein 7.1 6.5 - 8.1 g/dL   Albumin 2.9 (L) 3.5 - 5.0 g/dL   AST 42 (H) 15 - 41 U/L   ALT 36 0 - 44 U/L   Alkaline Phosphatase 65 38 - 126 U/L   Total Bilirubin 1.2 0.3 - 1.2 mg/dL   Bilirubin, Direct 0.4 (H) 0.0 - 0.2 mg/dL   Indirect Bilirubin 0.8 0.3 - 0.9 mg/dL    Comment: Performed at [facility] [address] [place], [place] [address]  Lipase, blood     Status: None   Collection Time: 07/11/18  7:12 PM  Result Value Ref Range   Lipase 22 11 - 51 U/L    Comment: Performed at [facility] [address] [place], [place] [address]  Troponin I - ONCE - STAT     Status: None   Collection Time: 07/11/18  7:12 PM  Result Value Ref Range   Troponin I <0.03 <0.03 ng/mL    Comment: Performed at [facility] [address] [place], [place] [address]  Digoxin level     Status: Abnormal   Collection Time: 07/11/18 10:08 PM  Result Value Ref Range   Digoxin Level <0.2 (L) 0.8 - 2.0 ng/mL    Comment: RESULTS CONFIRMED BY MANUAL DILUTION Performed at [facility], [place] [address] [place], [place] [address]    Dg Chest Portable 1 View  Result Date: 07/11/2018 CLINICAL DATA:  Chest pain for several hours EXAM: PORTABLE CHEST 1 VIEW COMPARISON:  05/22/2016 FINDINGS: Cardiac shadow is within normal limits. Postsurgical changes are noted. Defibrillator is again noted and stable. The lungs are well aerated without focal infiltrate or sizable effusion. No acute bony abnormality is noted. IMPRESSION: No active disease. Electronically Signed   By: Inez Catalina M.D.   On: 07/11/2018 20:16     Pending Labs Unresulted Labs (From admission, onward)    Start     Ordered   07/12/18 4097  Basic metabolic panel  Tomorrow morning,   R     07/11/18 2232   07/12/18 0500  HIV antibody (Routine Testing)  Tomorrow morning,   R     07/11/18 2343   07/12/18 0000  Troponin I - Now Then Q6H  Now then every 6 hours,   R     07/11/18 2240   07/11/18 2335  TSH  Add-on,   R     07/11/18 2343   07/11/18 2319  CBC with Differential/Platelet  Once,   R     07/11/18 2319   07/11/18 2231  Culture, blood (routine x 2)  BLOOD CULTURE X 2,   R     07/11/18 2231   07/11/18 1838  Rapid urine drug screen (hospital performed)  ONCE - STAT,   R     07/11/18 1837          Vitals/Pain Today's Vitals   07/11/18 2215 07/11/18 2304 07/11/18 2315 07/11/18 2330  BP: (!) 73/44 (!) 74/48 (!) 71/51 (!) 71/51  Pulse: 83 80 77 80  Resp: 20 17 (!) 21 18  Temp:      TempSrc:      SpO2: 95% 100% 98% 95%  Weight:      Height:      PainSc:  Isolation Precautions No active isolations  Medications Medications  0.9 %  sodium chloride infusion (has no administration in time range)  rivaroxaban (XARELTO) tablet 20 mg (has no administration in time range)  diphenhydrAMINE (BENADRYL) injection 25 mg (25 mg Intravenous Given 07/11/18 1848)  EPINEPHrine (EPI-PEN) injection 0.3 mg (0.3 mg Intramuscular Given 07/11/18 1847)  methylPREDNISolone sodium succinate (SOLU-MEDROL) 125 mg/2 mL injection 125 mg (125 mg Intravenous Given 07/11/18 1847)  famotidine (PEPCID) IVPB 20 mg in NS 100 mL IVPB (0 mg Intravenous Stopped 07/11/18 1926)    Mobility walks Low fall risk   Focused Assessments Cardiac Assessment Handoff:    Lab Results  Component Value Date   CKTOTAL 84 11/21/2010   CKMB 2.2 11/21/2010   TROPONINI <0.03 07/11/2018   No results found for: DDIMER Does the Patient currently have chest pain? No     R Recommendations: See Admitting Provider Note  Report given to:   Additional  Notes:

## 2018-07-11 NOTE — ED Notes (Signed)
Was told by previous nurse that patient is refusing to give a urine sample. He has been asked several time to do so. Pt has been very nasty with staff.

## 2018-07-11 NOTE — ED Triage Notes (Signed)
Pt here from home. Pt complains of chest pain and hives. Pt states it started at 4am this morning

## 2018-07-11 NOTE — ED Provider Notes (Signed)
[facility] EMERGENCY DEPARTMENT Provider Note   CSN: [id number] Arrival date & time: 07/11/18  1816    History   Chief Complaint No chief complaint on file.   HPI John Davenport is a 43 y.o. male with history of CAD status post CABG, MI, chronic systolic CHF ischemic cardiomyopathy status post [place] ICD, COPD, CVA, on Xarelto, cocaine abuse, hypertension, tobacco use is here for evaluation of severely itchy diffuse urticarial rash to his entire body.  Sudden onset around 4 AM yesterday.  Had several episodes of nonbloody nonbilious emesis throughout the night as well.  He has taken 3-4 Benadryl without relief.  About 30 minutes prior to arrival he had brief episode of left-sided sharp chest pain that lasted for approximately 20 to 30 minutes, nonradiating.  It was associated with "I lost my vision", sweats, lightheadedness.  States the symptoms were similar to his previous heart attack.  Reports chronic shortness of breath not necessarily worsened.  Reports being compliant with all his medicines but cannot tell me what he is taking.  No headache, changes in his chronic cough or dyspnea, abdominal pain, diarrhea.  Patient is very somnolent during exam and frequently falls asleep during conversation.  Multiple track marks noted to bilateral forearms, when asked about these he became very defensive and stated "it is none of your business".  RN received a phone call from patient's girlfriend/fianc who states patient is using IV drugs and smoking crack cocaine.  Patient adamantly denies any drug use.    HPI  Past Medical History:  Diagnosis Date   Anxiety state, unspecified    Back pain    Cancer ([place])    Lung CA   Chronic systolic heart failure ([place])    a. Ischemic CM (EF 30% by echo in 2011) b. echo 08/2015: EF 20% with severe HK and AK of the inferoseptal and apical myocardium.   Coronary artery disease    a. s/p AMI 10/09 s/p BMS to LAD 12/2007  with subsequent POBA to LAD stent in 08/2008.  b. eventually required single vessel CABG (L-LAD) in 03/2009 due to restenosis. c. cath 2013 showed patent LIMA sequential to the diags with LAD filling retrograde, no new disease in RCA and Cx, low LVEDP, sx felt noncardiac. d. 08/2015: NSTEMI: CTO of prox LAD, 99% stenosis of small dCx (no stent), + for cocaine   Esophageal reflux    Heart attack ([place])    Hypercholesteremia    ICD (implantable cardioverter-defibrillator) infection ([place])    a. [place] Sci ICD 12/2008.    Ischemic cardiomyopathy    echocardiogram 12/11: Mild LVH, EF 30-35%, anteroseptal and apical akinesis, grade 1 diastolic dysfunction   LBP (low back pain)    OSA (obstructive sleep apnea) 12/30/2010   Paroxysmal ventricular tachycardia (HCC)    Stroke ([place])    Tobacco abuse     Patient Active Problem List   Diagnosis Date Noted   Encounter for therapeutic drug monitoring 44/31/5400   Embolic stroke ([place]) 86/76/1952   Hypotension 05/23/2016   Smokeless tobacco use 05/23/2016   Cocaine abuse ([place]) 05/23/2016   Cerebrovascular accident (CVA) due to thrombosis of left middle cerebral artery ([place]) s/p IV tPA and mechanical thrombectomy    Chronic systolic congestive heart failure (HCC)    Cough with sputum 04/17/2016   Intractable upper abdominal pain 03/09/2016   CKD (chronic kidney disease), stage III ([place]) 03/09/2016   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   RUQ abdominal pain 03/09/2016  NSTEMI (non-ST elevated myocardial infarction) ([place]) 09/05/2015   Ischemic cardiomyopathy 09/05/2015   Tobacco abuse 09/05/2015   Hyperlipidemia 09/05/2015   Renal insufficiency 09/05/2015   OSA (obstructive sleep apnea) 12/30/2010   Cough 12/09/2010   Dental abscess 12/09/2010   GERD 04/10/2010   PAROXYSMAL VENTRICULAR TACHYCARDIA 03/22/2010   Chronic combined systolic and diastolic CHF (congestive heart failure) ([place]) 03/22/2010   Generalized  anxiety disorder 05/16/2009   MYOCARDIAL INFARCTION 12/19/2008   CAD in native artery 12/19/2008    Past Surgical History:  Procedure Laterality Date   CARDIAC CATHETERIZATION N/A 09/06/2015   Procedure: Left Heart Cath and Cors/Grafts Angiography;  Surgeon: Sherren Mocha, MD;  Location: [facility];  Service: Cardiovascular;  Laterality: N/A;   CARDIAC DEFIBRILLATOR PLACEMENT     CARDIAC DEFIBRILLATOR PLACEMENT     2010 by [facility]   CORONARY ANGIOPLASTY WITH STENT PLACEMENT     LAD stenting 10/09 and re-opening 6/10   CORONARY ARTERY BYPASS GRAFT     1/11   IR GENERIC HISTORICAL  05/21/2016   IR PERCUTANEOUS ART THROMBECTOMY/INFUSION INTRACRANIAL INC DIAG ANGIO 05/21/2016 Luanne Bras, MD [facility]   IR GENERIC HISTORICAL  06/16/2016   IR RADIOLOGIST EVAL & MGMT 06/16/2016 [facility]   LEFT HEART CATHETERIZATION WITH CORONARY ANGIOGRAM N/A 08/07/2011   Procedure: LEFT HEART CATHETERIZATION WITH CORONARY ANGIOGRAM;  Surgeon: Josue Hector, MD;  Location: [facility] CATH LAB;  Service: Cardiovascular;  Laterality: N/A;   RADIOLOGY WITH ANESTHESIA N/A 05/21/2016   Procedure: RADIOLOGY WITH ANESTHESIA;  Surgeon: Luanne Bras, MD;  Location: [facility];  Service: Radiology;  Laterality: N/A;   RIGHT/LEFT HEART CATH AND CORONARY ANGIOGRAPHY N/A 02/03/2018   Procedure: RIGHT/LEFT HEART CATH AND CORONARY ANGIOGRAPHY;  Surgeon: Larey Dresser, MD;  Location: [facility];  Service: Cardiovascular;  Laterality: N/A;        Home Medications    Prior to Admission medications   Medication Sig Start Date End Date Taking? Authorizing Provider  amiodarone (PACERONE) 200 MG tablet Take 1 tablet (200 mg total) by mouth 2 (two) times daily. 02/17/18   Shirley Friar, PA-C  carvedilol (COREG) 6.25 MG tablet Take 1 tablet (6.25 mg total) by mouth 2 (two) times daily. 01/27/18 04/27/18  Shirley Friar, PA-C  digoxin (LANOXIN) 0.125 MG tablet Take 1 tablet (125 mcg  total) by mouth daily. 02/03/18 02/03/19  Larey Dresser, MD  furosemide (LASIX) 40 MG tablet Take 1 tablet (40 mg total) by mouth 2 (two) times daily. 02/03/18 05/04/18  Larey Dresser, MD  losartan (COZAAR) 25 MG tablet Take 1 tablet (25 mg total) by mouth 2 (two) times daily. 02/18/18 05/19/18  Shirley Friar, PA-C  nitroGLYCERIN (NITROSTAT) 0.4 MG SL tablet Place 1 tablet (0.4 mg total) under the tongue every 5 (five) minutes as needed for chest pain. Patient not taking: Reported on 02/17/2018 09/24/15   Lyda Jester M, PA-C  potassium chloride SA (K-DUR,KLOR-CON) 20 MEQ tablet Take 1 tablet (20 mEq total) by mouth daily. 01/27/18 04/27/18  Shirley Friar, PA-C  rivaroxaban (XARELTO) 20 MG TABS tablet Take 1 tablet (20 mg total) by mouth daily with supper. Restart on Thursday morning. 02/03/18   Larey Dresser, MD  spironolactone (ALDACTONE) 25 MG tablet Take 1 tablet (25 mg total) by mouth at bedtime. 01/27/18   Shirley Friar, PA-C    Family History Family History  Problem Relation Age of Onset   Heart attack Father 17  died at 72 after multiple MI's   COPD Maternal Grandmother     Social History Social History   Tobacco Use   Smoking status: Current Every Day Smoker    Packs/day: 0.50    Years: 20.00    Pack years: 10.00    Types: Cigarettes   Smokeless tobacco: Former Systems developer    Types: Chew   Tobacco comment: smoke 0.5 per week  Substance Use Topics   Alcohol use: No   Drug use: No    Comment: REMOTE H/O SUBSTANCE ABUSE     Allergies   Patient has no known allergies.   Review of Systems Review of Systems  Respiratory: Positive for shortness of breath.   Cardiovascular: Positive for chest pain.  Gastrointestinal: Positive for nausea and vomiting.  Skin: Positive for rash.  Neurological: Positive for light-headedness.  All other systems reviewed and are negative.    Physical Exam Updated Vital Signs BP (!) 73/44     Pulse 83    Temp 98.3 F (36.8 C) (Oral)    Resp 20    Ht 6\' 3"  (1.905 m)    Wt 68 kg    SpO2 95%    BMI 18.75 kg/m   Physical Exam Vitals signs and nursing note reviewed.  Constitutional:      General: He is in acute distress.     Appearance: He is well-developed.     Comments: Anxious appearing, actively itching his skin.  HENT:     Head: Normocephalic and atraumatic.     Comments: No facial, tongue angioedema.    Right Ear: External ear normal.     Left Ear: External ear normal.     Nose: Nose normal.     Mouth/Throat:     Comments: Poor dentition.  Dry lips.  Dry mucous membranes.  No lip, tongue or oropharyngeal edema.  Widely patent oropharynx.  No stridor. Eyes:     General: No scleral icterus.    Conjunctiva/sclera: Conjunctivae normal.  Neck:     Musculoskeletal: Normal range of motion and neck supple.  Cardiovascular:     Rate and Rhythm: Normal rate and regular rhythm.     Heart sounds: Normal heart sounds. No murmur.  Pulmonary:     Effort: Pulmonary effort is normal.     Breath sounds: Normal breath sounds. No wheezing.  Abdominal:     General: Abdomen is flat.     Palpations: Abdomen is soft.     Tenderness: There is no abdominal tenderness.  Musculoskeletal: Normal range of motion.        General: No deformity.  Skin:    General: Skin is warm and dry.     Capillary Refill: Capillary refill takes less than 2 seconds.     Comments: 2 areas of mild erythema and edema to left mid forearm with surrounding track marks, non tender. No drainage. Multiple other areas of track marks to right forearm. Diffuse erythematous urticarial rash to chest, back, upper extremities and abdomen.   Neurological:     Mental Status: He is alert and oriented to person, place, and time.  Psychiatric:        Behavior: Behavior normal.        Thought Content: Thought content normal.        Judgment: Judgment normal.      ED Treatments / Results  Labs (all labs ordered are listed,  but only abnormal results are displayed) Labs Reviewed  CBC WITH DIFFERENTIAL/PLATELET - Abnormal; Notable for  the following components:      Result Value   WBC 10.8 (*)    Platelets 147 (*)    Neutro Abs 10.0 (*)    Lymphs Abs 0.5 (*)    All other components within normal limits  BASIC METABOLIC PANEL - Abnormal; Notable for the following components:   Sodium 132 (*)    Glucose, Bld 102 (*)    Creatinine, Ser 1.69 (*)    Calcium 8.8 (*)    GFR calc non Af Amer 48 (*)    GFR calc Af Amer 56 (*)    All other components within normal limits  HEPATIC FUNCTION PANEL - Abnormal; Notable for the following components:   Albumin 2.9 (*)    AST 42 (*)    Bilirubin, Direct 0.4 (*)    All other components within normal limits  CULTURE, BLOOD (ROUTINE X 2)  CULTURE, BLOOD (ROUTINE X 2)  ETHANOL  LIPASE, BLOOD  TROPONIN I  RAPID URINE DRUG SCREEN, HOSP PERFORMED  DIGOXIN LEVEL  BASIC METABOLIC PANEL  TROPONIN I  TROPONIN I    EKG EKG Interpretation  Date/Time:  Sunday July 11 2018 18:23:10 EDT Ventricular Rate:  108 PR Interval:    QRS Duration: 139 QT Interval:  312 QTC Calculation: 419 R Axis:   -71 Text Interpretation:  Sinus tachycardia LAE, consider biatrial enlargement Nonspecific IVCD with LAD LVH with secondary repolarization abnormality Probable anterior infarct, age indeterminate Confirmed by Veryl Speak ([id number]) on 07/11/2018 7:59:59 PM   Radiology Dg Chest Portable 1 View  Result Date: 07/11/2018 CLINICAL DATA:  Chest pain for several hours EXAM: PORTABLE CHEST 1 VIEW COMPARISON:  05/22/2016 FINDINGS: Cardiac shadow is within normal limits. Postsurgical changes are noted. Defibrillator is again noted and stable. The lungs are well aerated without focal infiltrate or sizable effusion. No acute bony abnormality is noted. IMPRESSION: No active disease. Electronically Signed   By: Inez Catalina M.D.   On: 07/11/2018 20:16    Procedures .Critical Care Performed by:  Kinnie Feil, PA-C Authorized by: Kinnie Feil, PA-C   Critical care provider statement:    Critical care time (minutes):  45   Critical care was necessary to treat or prevent imminent or life-threatening deterioration of the following conditions: anaphylaxis, hypotension.   Critical care was time spent personally by me on the following activities:  Discussions with consultants, evaluation of patient's response to treatment, examination of patient, ordering and performing treatments and interventions, ordering and review of laboratory studies, ordering and review of radiographic studies, pulse oximetry, re-evaluation of patient's condition, obtaining history from patient or surrogate and review of old charts   I assumed direction of critical care for this patient from another provider in my specialty: no     (including critical care time)  Medications Ordered in ED Medications  diphenhydrAMINE (BENADRYL) injection 25 mg (25 mg Intravenous Given 07/11/18 1848)  EPINEPHrine (EPI-PEN) injection 0.3 mg (0.3 mg Intramuscular Given 07/11/18 1847)  methylPREDNISolone sodium succinate (SOLU-MEDROL) 125 mg/2 mL injection 125 mg (125 mg Intravenous Given 07/11/18 1847)  famotidine (PEPCID) IVPB 20 mg in NS 100 mL IVPB (0 mg Intravenous Stopped 07/11/18 1926)     Initial Impression / Assessment and Plan / ED Course  I have reviewed the triage vital signs and the nursing notes.  Pertinent labs & imaging results that were available during my care of the patient were reviewed by me and considered in my medical decision making (see chart for details).  Clinical  Course as of Jul 11 2247  Sun Jul 11, 2018  1961 Creatinine(!): 1.69 [CG]  1959 GFR, Est Non African American(!): 48 [CG]  1959 WBC(!): 10.8 [CG]  2141 I reevaluated patient.  Sound asleep, easily arousable.  Poor eye contact.  Very defensive and gives me one-word answers frequently says "I do not know".  States his blood pressure is  always in the 70s.  He cannot tell me what medicines he is taking.   [CG]    Clinical Course User Index [CG] Kinnie Feil, PA-C      Given urticarial rash, nausea, vomiting, chest pain and hypotension upon arrival concern for anaphylaxis.  Patient states his blood pressure is always in the 70s.  He has significant cardiac history and reports brief, sharp, left-sided chest pain 30 minutes prior to arrival.  Adamantly denies illicit drug use however girlfriend called ED to notify he has been using IV drugs and crack cocaine.  Track marks noted in forearms.  Benadryl, methylprednisone, Pepcid and EpiPen given with 250 cc IV fluid bolus for anaphylactic reaction and hypotension.  Will reassess.  2211: Persistent hypotension with MAPs in the 50s. Patient is asymptomatic, very rude to staff and defensive with any questioning.  Chart review shows last cardiology visit his SBP was in the 80s and pt is adamant his SBP is in the 70s usually.  Will speak to hospitalist for admission for serial troponins for CP in high risk patient and observation of persistent hypotension. May benefit from cardiology consult in AM.  His rash has completely resolved in the ED. Tolerating PO.   Final Clinical Impressions(s) / ED Diagnoses   Final diagnoses:  Urticarial rash  Hypotension, unspecified hypotension type    ED Discharge Orders    None       Arlean Hopping 07/11/18 2249    Veryl Speak, MD 07/11/18 2302

## 2018-07-11 NOTE — H&P (Addendum)
Date: 07/11/2018               Patient Name:  John Davenport MRN: [id number]  DOB: [birth year] Age / Sex: 43 y.o., male   PCP: Patient, No Pcp Per         Medical Service: Internal Medicine Teaching Service         Attending Physician: Dr. Rebeca Alert, Raynaldo Opitz, MD    First Contact: [name], [place] Pager: [phone]  Second Contact: Dr. Lorella Nimrod Pager: [phone]       After Hours (After 5p/  First Contact Pager: [phone]  weekends / holidays): Second Contact Pager: [phone]   Chief Complaint: Rash  History of Present Illness:  John Davenport is a 43 yo M w/ PMH of CAD s/p CABG, NSTEMi w/ cocain use, Ischemic cardiomyopathy s/p ICD, HFrEF, COPD, embolic CVA on Xarelto, HTN and tobacco use presenting with rash, chest pain and fever. John Davenport was observed resting comfortably in bed. He is somnolent during history-taking but is AAOx3 and answers questions appropriately. He was in his usual state of health until around 4am this morning when he woke up with itchy hives that started around his stomach area but quickly spread to rest of his body. He states the rash woke him up and he took some Benadryl with significant relief. However the rash re-occurred throughout the day and he started having other symptoms such as blurry vision, light-headedness, chest pain (substernal ' knife stabbing' in left side with associated dyspnea without radiation lasting about 30 minutes w/o inciting event), palpitations, headache, nausea, vomiting and fever (states mother measured at home of 103F) and came to the ED for evaluation. He states he never had these rashes in the past and states he has no known allergies. He denies any bug bites, outdoor activity, unusual dietary consumption or changes in his medications. He denies any illicit substance use besides marijuana and states his supply has not changed. He mentions that his mother has strong hx of allergies but is unable to describe specific triggers. Of note,  ED RN spoke with patient's family who mentions that he has recent hx of IV drug use and crack cocaine.  In the ED, he was found to have full body rash with pruritus with systolic blood pressure in 70s. He received epinephrine, solumedrol and benadryl with resolution of his rash and pruritus but his blood pressure did not improve. He was also found to have negative trops and no significant change on EKG. He was found to be hypotensive at 76/44 and felt he was unsafe to be discharged. IM was consulted for admission.  Meds: Amiodarone 200mg  BID Carvedilol 6.25mg  BID Digoxin 0.125mg  daily Furosemide 40mg  daily Losartan 25mg  daily Nitroglycerin 0.4mg  SL Prn K-dur 79mEq daily Xarelto 20mg  daily Spironolactone 25mg  daily  Allergies: Allergies as of 07/11/2018   (No Known Allergies)   Past Medical History:  Diagnosis Date   Anxiety state, unspecified    Back pain    Cancer ([place])    Lung CA   Chronic systolic heart failure ([place])    a. Ischemic CM (EF 30% by echo in 2011) b. echo 08/2015: EF 20% with severe HK and AK of the inferoseptal and apical myocardium.   Coronary artery disease    a. 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  b. eventually required single vessel CABG (L-LAD) in 03/2009 due to restenosis. c. cath 2013 showed patent LIMA sequential to  the diags with LAD filling retrograde, no new disease in RCA and Cx, low LVEDP, sx felt noncardiac. d. 08/2015: NSTEMI: CTO of prox LAD, 99% stenosis of small dCx (no stent), + for cocaine   Esophageal reflux    Heart attack ([place])    Hypercholesteremia    ICD (implantable cardioverter-defibrillator) infection ([place])    a. [place] Sci ICD 12/2008.    Ischemic cardiomyopathy    echocardiogram 12/11: Mild LVH, EF 30-35%, anteroseptal and apical akinesis, grade 1 diastolic dysfunction   LBP (low back pain)    OSA (obstructive sleep apnea) 12/30/2010   Paroxysmal ventricular tachycardia (HCC)     Stroke ([facility])    Tobacco abuse    Family History:  Father had early cardiac condition in his 35s and passed away in his 43s due to continuing cardiac issues. Mother has large list of allergies but no hx of anaphylactic shock  Family History  Problem Relation Age of Onset   Heart attack Father 42       died at 24 after multiple MI's   COPD Maternal Grandmother    Social History:  Currently on disability. Has been staying home due to social distancing. Pack a day smoker with 30 pack year smoking history. 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 Mentions marijuana use but no other illicit substance use.  Review of Systems: A complete ROS was negative except as per HPI.  Physical Exam: Blood pressure (!) 71/51, pulse 80, temperature 98.3 F (36.8 C), temperature source Oral, resp. rate 18, height 6\' 3"  (1.905 m), weight 68 kg, SpO2 95 %. Physical Exam  Constitutional: He is oriented to person, place, and time and well-developed, well-nourished, and in no distress. No distress.  HENT:  Head: Normocephalic.  Dry mucous membranes. Poor dentition.  Eyes: Conjunctivae are normal.  Neck: Normal range of motion. Neck supple. No JVD present.  Cardiovascular: Normal rate, regular rhythm, normal heart sounds and intact distal pulses.  No murmur heard. Distant heart sounds  Pulmonary/Chest: Effort normal. He has no wheezes. He has no rales. He exhibits no tenderness.  Distant breath sounds  Abdominal: Soft. Bowel sounds are normal. There is no abdominal tenderness.  Musculoskeletal: Normal range of motion.        General: No edema.  Neurological: He is alert and oriented to person, place, and time.  somnolent  Skin: Skin is warm and dry. No rash noted. He is not diaphoretic.   EKG: personally reviewed my interpretation is sinus tachycardia, left axis deviation, wide QRS, no significant change from prior EKG.  CXR: personally reviewed my interpretation is no  lobar consolidation, no pleural effusion, no vascular congestion.  Assessment & Plan by Problem: Active Problems:   Chest pain  John Davenport is a 43 yo M w/ PMH of CAD s/p CABG, NSTEMi w/ cocain use, Ischemic cardiomyopathy s/p ICD, HFrEF, COPD, 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 His complaint of chest pain is concerning considering he has complicated cardiac hx w/ prior NSTEMI 2/2 cocaine use but his last catherization on 02/03/2018 showed stable CAD w/o critical stenosis. Of note, his last hear failure clinic visit showed systolic bp around 35T at the time. Unclear if hypotension is due to anaphylaxis or baseline systolic heart failure. His CBC does show mild leukocytosis with left shift concerning. If he does endorse IVDU, he could possibly have blood-borne infection with septic shock causing  hypotension. Will need to f/u with further testing.  Hypotension 2/2 anaphylactic shock vs systolic heart failure vs sepsis Current BP 74/48. Presented with urticarial rash which has currently resolved. Questionable hx of IVDU. WBC 10.8 w/ left shift. 20% EF at baseline. Current weight 68kg (77kg in 02/17/18) Had multiple bp lowering meds on home regimen - NS 100cc/hr for 5 hours - F/u UDS, TSH - Collect blood cultures - Consult heart failure in AM for med reconcillation - Hold home meds: furosemide, digoxin, amiodarone, losartan, carvedilol, spironolactone  Atypical chest pain 2/2 ACS vs GERD vs MSK Initial trop negative. EKG shows no significant change. L/R cath 01/2018 w/ stable CAD. Has ICD in place. Prior hx of NSTEMI 2/2 concurrent cocaine use s/p CABG. - Telemetry - Trend troponin - Repeat EKG in AM  Urticarial rash 2/2 anaphylaxis Currently resolved. No rash or pruritus after epinephrine and solumedrol in ED. Unclear trigger. - F/u UDS  AKI 2/2 dehydration vs cardio-renal Appear dry on exam.  Current creatinine 1.69. Baseline 1-1.2 - Hold nephrotoxic meds - Trend Bmp - Fluid resuscitation as above  Hx of CVA 2/2 cardio-embolic source On Xarelto at home - C/w home med: rivaroxaban 20mg  - Will need to lower Xarelto dose to 15mg  if CrCL goes below 51  DVT prophx: Xarelto Diet: Cardiac Bowel: Senokot Code: Full  Dispo: Admit patient to Inpatient with expected length of stay greater than 2 midnights.  Signed: Mosetta Anis, MD 07/11/2018, 11:48 PM  Pager: [phone]

## 2018-07-11 NOTE — ED Notes (Signed)
Attempted to obtain UA from pt. Pt states he is not able to provide one at this time

## 2018-07-12 ENCOUNTER — Inpatient Hospital Stay (HOSPITAL_COMMUNITY)
Admission: EM | Admit: 2018-07-12 | Discharge: 2018-07-20 | DRG: 260 | Discharge status: Against Medical Advice | Payer: Medicare Other | Attending: Internal Medicine | Admitting: Internal Medicine

## 2018-07-12 ENCOUNTER — Other Ambulatory Visit: Payer: Self-pay

## 2018-07-12 ENCOUNTER — Encounter (HOSPITAL_COMMUNITY): Payer: Self-pay | Admitting: General Practice

## 2018-07-12 ENCOUNTER — Telehealth: Payer: Self-pay | Admitting: Internal Medicine

## 2018-07-12 ENCOUNTER — Encounter (HOSPITAL_COMMUNITY): Payer: Self-pay | Admitting: Emergency Medicine

## 2018-07-12 ENCOUNTER — Emergency Department (HOSPITAL_COMMUNITY): Payer: Medicare Other

## 2018-07-12 DIAGNOSIS — I5020 Unspecified systolic (congestive) heart failure: Secondary | ICD-10-CM

## 2018-07-12 DIAGNOSIS — F1123 Opioid dependence with withdrawal: Secondary | ICD-10-CM | POA: Diagnosis present

## 2018-07-12 DIAGNOSIS — I2510 Atherosclerotic heart disease of native coronary artery without angina pectoris: Secondary | ICD-10-CM | POA: Diagnosis present

## 2018-07-12 DIAGNOSIS — F1290 Cannabis use, unspecified, uncomplicated: Secondary | ICD-10-CM | POA: Diagnosis present

## 2018-07-12 DIAGNOSIS — Z7901 Long term (current) use of anticoagulants: Secondary | ICD-10-CM

## 2018-07-12 DIAGNOSIS — B1920 Unspecified viral hepatitis C without hepatic coma: Secondary | ICD-10-CM | POA: Diagnosis present

## 2018-07-12 DIAGNOSIS — I252 Old myocardial infarction: Secondary | ICD-10-CM | POA: Diagnosis not present

## 2018-07-12 DIAGNOSIS — M545 Low back pain: Secondary | ICD-10-CM | POA: Diagnosis present

## 2018-07-12 DIAGNOSIS — L509 Urticaria, unspecified: Secondary | ICD-10-CM | POA: Diagnosis present

## 2018-07-12 DIAGNOSIS — I5040 Unspecified combined systolic (congestive) and diastolic (congestive) heart failure: Secondary | ICD-10-CM | POA: Diagnosis not present

## 2018-07-12 DIAGNOSIS — J449 Chronic obstructive pulmonary disease, unspecified: Secondary | ICD-10-CM | POA: Diagnosis present

## 2018-07-12 DIAGNOSIS — N179 Acute kidney failure, unspecified: Secondary | ICD-10-CM

## 2018-07-12 DIAGNOSIS — Z95810 Presence of automatic (implantable) cardiac defibrillator: Secondary | ICD-10-CM | POA: Diagnosis not present

## 2018-07-12 DIAGNOSIS — Z720 Tobacco use: Secondary | ICD-10-CM

## 2018-07-12 DIAGNOSIS — D696 Thrombocytopenia, unspecified: Secondary | ICD-10-CM | POA: Diagnosis present

## 2018-07-12 DIAGNOSIS — B182 Chronic viral hepatitis C: Secondary | ICD-10-CM | POA: Diagnosis not present

## 2018-07-12 DIAGNOSIS — I255 Ischemic cardiomyopathy: Secondary | ICD-10-CM

## 2018-07-12 DIAGNOSIS — F1190 Opioid use, unspecified, uncomplicated: Secondary | ICD-10-CM | POA: Diagnosis not present

## 2018-07-12 DIAGNOSIS — F1490 Cocaine use, unspecified, uncomplicated: Secondary | ICD-10-CM | POA: Diagnosis present

## 2018-07-12 DIAGNOSIS — R000 Tachycardia, unspecified: Secondary | ICD-10-CM | POA: Diagnosis not present

## 2018-07-12 DIAGNOSIS — F419 Anxiety disorder, unspecified: Secondary | ICD-10-CM | POA: Diagnosis not present

## 2018-07-12 DIAGNOSIS — E871 Hypo-osmolality and hyponatremia: Secondary | ICD-10-CM | POA: Diagnosis present

## 2018-07-12 DIAGNOSIS — Z79899 Other long term (current) drug therapy: Secondary | ICD-10-CM

## 2018-07-12 DIAGNOSIS — K219 Gastro-esophageal reflux disease without esophagitis: Secondary | ICD-10-CM | POA: Diagnosis present

## 2018-07-12 DIAGNOSIS — G4733 Obstructive sleep apnea (adult) (pediatric): Secondary | ICD-10-CM | POA: Diagnosis present

## 2018-07-12 DIAGNOSIS — I5022 Chronic systolic (congestive) heart failure: Secondary | ICD-10-CM | POA: Diagnosis present

## 2018-07-12 DIAGNOSIS — A4101 Sepsis due to Methicillin susceptible Staphylococcus aureus: Secondary | ICD-10-CM | POA: Diagnosis present

## 2018-07-12 DIAGNOSIS — R0789 Other chest pain: Secondary | ICD-10-CM

## 2018-07-12 DIAGNOSIS — Z85118 Personal history of other malignant neoplasm of bronchus and lung: Secondary | ICD-10-CM

## 2018-07-12 DIAGNOSIS — E878 Other disorders of electrolyte and fluid balance, not elsewhere classified: Secondary | ICD-10-CM | POA: Diagnosis present

## 2018-07-12 DIAGNOSIS — I330 Acute and subacute infective endocarditis: Secondary | ICD-10-CM | POA: Diagnosis present

## 2018-07-12 DIAGNOSIS — I472 Ventricular tachycardia, unspecified: Secondary | ICD-10-CM

## 2018-07-12 DIAGNOSIS — Z951 Presence of aortocoronary bypass graft: Secondary | ICD-10-CM

## 2018-07-12 DIAGNOSIS — I38 Endocarditis, valve unspecified: Secondary | ICD-10-CM | POA: Diagnosis not present

## 2018-07-12 DIAGNOSIS — F17210 Nicotine dependence, cigarettes, uncomplicated: Secondary | ICD-10-CM | POA: Diagnosis present

## 2018-07-12 DIAGNOSIS — T782XXA Anaphylactic shock, unspecified, initial encounter: Secondary | ICD-10-CM

## 2018-07-12 DIAGNOSIS — Z825 Family history of asthma and other chronic lower respiratory diseases: Secondary | ICD-10-CM

## 2018-07-12 DIAGNOSIS — R21 Rash and other nonspecific skin eruption: Secondary | ICD-10-CM | POA: Diagnosis not present

## 2018-07-12 DIAGNOSIS — Y831 Surgical operation with implant of artificial internal device as the cause of abnormal reaction of the patient, or of later complication, without mention of misadventure at the time of the procedure: Secondary | ICD-10-CM | POA: Diagnosis present

## 2018-07-12 DIAGNOSIS — R011 Cardiac murmur, unspecified: Secondary | ICD-10-CM | POA: Diagnosis not present

## 2018-07-12 DIAGNOSIS — F1990 Other psychoactive substance use, unspecified, uncomplicated: Secondary | ICD-10-CM | POA: Diagnosis not present

## 2018-07-12 DIAGNOSIS — R7881 Bacteremia: Secondary | ICD-10-CM | POA: Diagnosis not present

## 2018-07-12 DIAGNOSIS — T827XXA Infection and inflammatory reaction due to other cardiac and vascular devices, implants and grafts, initial encounter: Principal | ICD-10-CM | POA: Diagnosis present

## 2018-07-12 DIAGNOSIS — N183 Chronic kidney disease, stage 3 unspecified: Secondary | ICD-10-CM | POA: Diagnosis present

## 2018-07-12 DIAGNOSIS — F329 Major depressive disorder, single episode, unspecified: Secondary | ICD-10-CM | POA: Diagnosis not present

## 2018-07-12 DIAGNOSIS — I959 Hypotension, unspecified: Secondary | ICD-10-CM | POA: Diagnosis not present

## 2018-07-12 DIAGNOSIS — E860 Dehydration: Secondary | ICD-10-CM | POA: Diagnosis present

## 2018-07-12 DIAGNOSIS — Z8249 Family history of ischemic heart disease and other diseases of the circulatory system: Secondary | ICD-10-CM

## 2018-07-12 DIAGNOSIS — B9561 Methicillin susceptible Staphylococcus aureus infection as the cause of diseases classified elsewhere: Secondary | ICD-10-CM | POA: Diagnosis not present

## 2018-07-12 DIAGNOSIS — R509 Fever, unspecified: Secondary | ICD-10-CM | POA: Diagnosis not present

## 2018-07-12 DIAGNOSIS — Z955 Presence of coronary angioplasty implant and graft: Secondary | ICD-10-CM | POA: Diagnosis not present

## 2018-07-12 DIAGNOSIS — R4587 Impulsiveness: Secondary | ICD-10-CM | POA: Diagnosis not present

## 2018-07-12 DIAGNOSIS — F1121 Opioid dependence, in remission: Secondary | ICD-10-CM | POA: Diagnosis present

## 2018-07-12 DIAGNOSIS — Z87891 Personal history of nicotine dependence: Secondary | ICD-10-CM

## 2018-07-12 DIAGNOSIS — Z8673 Personal history of transient ischemic attack (TIA), and cerebral infarction without residual deficits: Secondary | ICD-10-CM | POA: Diagnosis not present

## 2018-07-12 DIAGNOSIS — R918 Other nonspecific abnormal finding of lung field: Secondary | ICD-10-CM | POA: Diagnosis not present

## 2018-07-12 DIAGNOSIS — I110 Hypertensive heart disease with heart failure: Secondary | ICD-10-CM | POA: Diagnosis not present

## 2018-07-12 DIAGNOSIS — N1831 Chronic kidney disease, stage 3a: Secondary | ICD-10-CM | POA: Diagnosis present

## 2018-07-12 DIAGNOSIS — R451 Restlessness and agitation: Secondary | ICD-10-CM | POA: Diagnosis not present

## 2018-07-12 DIAGNOSIS — I4729 Other ventricular tachycardia: Secondary | ICD-10-CM

## 2018-07-12 DIAGNOSIS — I130 Hypertensive heart and chronic kidney disease with heart failure and stage 1 through stage 4 chronic kidney disease, or unspecified chronic kidney disease: Secondary | ICD-10-CM | POA: Diagnosis present

## 2018-07-12 DIAGNOSIS — T827XXS Infection and inflammatory reaction due to other cardiac and vascular devices, implants and grafts, sequela: Secondary | ICD-10-CM

## 2018-07-12 DIAGNOSIS — Z9889 Other specified postprocedural states: Secondary | ICD-10-CM | POA: Diagnosis not present

## 2018-07-12 DIAGNOSIS — R454 Irritability and anger: Secondary | ICD-10-CM | POA: Diagnosis not present

## 2018-07-12 DIAGNOSIS — Z8619 Personal history of other infectious and parasitic diseases: Secondary | ICD-10-CM | POA: Diagnosis present

## 2018-07-12 LAB — CBC WITH DIFFERENTIAL/PLATELET
Abs Immature Granulocytes: 0.11 10*3/uL — ABNORMAL HIGH (ref 0.00–0.07)
Abs Immature Granulocytes: 0.11 10*3/uL — ABNORMAL HIGH (ref 0.00–0.07)
Basophils Absolute: 0 10*3/uL (ref 0.0–0.1)
Basophils Absolute: 0 10*3/uL (ref 0.0–0.1)
Basophils Relative: 0 %
Basophils Relative: 0 %
Eosinophils Absolute: 0 10*3/uL (ref 0.0–0.5)
Eosinophils Absolute: 0 10*3/uL (ref 0.0–0.5)
Eosinophils Relative: 0 %
Eosinophils Relative: 0 %
HCT: 40.8 % (ref 39.0–52.0)
HCT: 43 % (ref 39.0–52.0)
Hemoglobin: 13.1 g/dL (ref 13.0–17.0)
Hemoglobin: 13.8 g/dL (ref 13.0–17.0)
Immature Granulocytes: 1 %
Immature Granulocytes: 1 %
Lymphocytes Relative: 2 %
Lymphocytes Relative: 4 %
Lymphs Abs: 0.4 10*3/uL — ABNORMAL LOW (ref 0.7–4.0)
Lymphs Abs: 0.3 10*3/uL — ABNORMAL LOW (ref 0.7–4.0)
MCH: 31 pg (ref 26.0–34.0)
MCH: 30.8 pg (ref 26.0–34.0)
MCHC: 32.1 g/dL (ref 30.0–36.0)
MCHC: 32.1 g/dL (ref 30.0–36.0)
MCV: 96.7 fL (ref 80.0–100.0)
MCV: 96 fL (ref 80.0–100.0)
Monocytes Absolute: 0.7 10*3/uL (ref 0.1–1.0)
Monocytes Absolute: 0.3 10*3/uL (ref 0.1–1.0)
Monocytes Relative: 5 %
Monocytes Relative: 3 %
Neutro Abs: 13.7 10*3/uL — ABNORMAL HIGH (ref 1.7–7.7)
Neutro Abs: 8.9 10*3/uL — ABNORMAL HIGH (ref 1.7–7.7)
Neutrophils Relative %: 92 %
Neutrophils Relative %: 92 %
Platelets: 107 10*3/uL — ABNORMAL LOW (ref 150–400)
Platelets: 95 10*3/uL — ABNORMAL LOW (ref 150–400)
RBC: 4.22 MIL/uL (ref 4.22–5.81)
RBC: 4.48 MIL/uL (ref 4.22–5.81)
RDW: 14.6 % (ref 11.5–15.5)
RDW: 14.2 % (ref 11.5–15.5)
WBC: 14.8 10*3/uL — ABNORMAL HIGH (ref 4.0–10.5)
WBC: 9.8 10*3/uL (ref 4.0–10.5)
nRBC: 0 % (ref 0.0–0.2)
nRBC: 0 % (ref 0.0–0.2)

## 2018-07-12 LAB — BLOOD CULTURE ID PANEL (REFLEXED)

## 2018-07-12 LAB — BASIC METABOLIC PANEL
Anion gap: 12 (ref 5–15)
BUN: 23 mg/dL — ABNORMAL HIGH (ref 6–20)
CO2: 25 mmol/L (ref 22–32)
Calcium: 8.6 mg/dL — ABNORMAL LOW (ref 8.9–10.3)
Chloride: 98 mmol/L (ref 98–111)
Creatinine, Ser: 1.76 mg/dL — ABNORMAL HIGH (ref 0.61–1.24)
GFR calc Af Amer: 53 mL/min — ABNORMAL LOW (ref >60)
GFR calc non Af Amer: 46 mL/min — ABNORMAL LOW (ref >60)
Glucose, Bld: 119 mg/dL — ABNORMAL HIGH (ref 70–99)
Potassium: 4.5 mmol/L (ref 3.5–5.1)
Sodium: 135 mmol/L (ref 135–145)

## 2018-07-12 LAB — COMPREHENSIVE METABOLIC PANEL
ALT: 40 U/L (ref 0–44)
AST: 51 U/L — ABNORMAL HIGH (ref 15–41)
Albumin: 2.9 g/dL — ABNORMAL LOW (ref 3.5–5.0)
Alkaline Phosphatase: 50 U/L (ref 38–126)
Anion gap: 14 (ref 5–15)
BUN: 35 mg/dL — ABNORMAL HIGH (ref 6–20)
CO2: 20 mmol/L — ABNORMAL LOW (ref 22–32)
Calcium: 8.5 mg/dL — ABNORMAL LOW (ref 8.9–10.3)
Chloride: 95 mmol/L — ABNORMAL LOW (ref 98–111)
Creatinine, Ser: 2.26 mg/dL — ABNORMAL HIGH (ref 0.61–1.24)
GFR calc Af Amer: 39 mL/min — ABNORMAL LOW (ref >60)
GFR calc non Af Amer: 34 mL/min — ABNORMAL LOW (ref >60)
Glucose, Bld: 108 mg/dL — ABNORMAL HIGH (ref 70–99)
Potassium: 4 mmol/L (ref 3.5–5.1)
Sodium: 129 mmol/L — ABNORMAL LOW (ref 135–145)
Total Bilirubin: 1 mg/dL (ref 0.3–1.2)
Total Protein: 7.3 g/dL (ref 6.5–8.1)

## 2018-07-12 LAB — TROPONIN I
Troponin I: <0.03 ng/mL (ref <0.03)
Troponin I: <0.03 ng/mL (ref <0.03)

## 2018-07-12 LAB — TSH: TSH: 1.353 u[IU]/mL (ref 0.350–4.500)

## 2018-07-12 LAB — BRAIN NATRIURETIC PEPTIDE
B Natriuretic Peptide: 876 pg/mL — ABNORMAL HIGH (ref 0.0–100.0)
B Natriuretic Peptide: 413.1 pg/mL — ABNORMAL HIGH (ref 0.0–100.0)

## 2018-07-12 LAB — DIGOXIN LEVEL: Digoxin Level: <0.2 ng/mL — ABNORMAL LOW (ref 0.8–2.0)

## 2018-07-12 LAB — HIV ANTIBODY (ROUTINE TESTING W REFLEX): HIV Screen 4th Generation wRfx: NONREACTIVE

## 2018-07-12 MED ORDER — NAFCILLIN SODIUM 1 G IJ SOLR: 2.0000 g | INTRAMUSCULAR | Status: DC

## 2018-07-12 MED ORDER — RIVAROXABAN 15 MG PO TABS: 15.0000 mg | ORAL_TABLET | Freq: Every day | ORAL | 0 refills | Status: DC

## 2018-07-12 MED ORDER — AMIODARONE HCL 200 MG PO TABS
200.0000 mg | ORAL_TABLET | Freq: Two times a day (BID) | ORAL | Status: DC
  Administered 2018-07-12 – 2018-07-20 (×15): 200 mg via ORAL
  Filled 2018-07-12 (×16): qty 1

## 2018-07-12 MED ORDER — ACETAMINOPHEN 325 MG PO TABS
650.0000 mg | ORAL_TABLET | Freq: Four times a day (QID) | ORAL | Status: DC | PRN
  Administered 2018-07-13 – 2018-07-14 (×4): 650 mg via ORAL
  Filled 2018-07-12 (×4): qty 2

## 2018-07-12 MED ORDER — DIPHENHYDRAMINE HCL 50 MG/ML IJ SOLN
50.0000 mg | Freq: Once | INTRAMUSCULAR | Status: AC
  Administered 2018-07-12: 19:00:00 50 mg via INTRAVENOUS
  Filled 2018-07-12: qty 1

## 2018-07-12 MED ORDER — ACETAMINOPHEN 325 MG PO TABS
650.0000 mg | ORAL_TABLET | Freq: Four times a day (QID) | ORAL | Status: DC | PRN
  Administered 2018-07-12: 11:00:00 650 mg via ORAL
  Filled 2018-07-12: qty 2

## 2018-07-12 MED ORDER — EPINEPHRINE 0.3 MG/0.3ML IJ SOAJ: 0.3000 mg | INTRAMUSCULAR | 0 refills | Status: DC | PRN

## 2018-07-12 MED ORDER — SODIUM CHLORIDE 0.9 % IV SOLN
2.0000 g | INTRAVENOUS | Status: DC
  Administered 2018-07-12 – 2018-07-13 (×3): 2 g via INTRAVENOUS
  Filled 2018-07-12 (×14): qty 2000

## 2018-07-12 MED ORDER — SODIUM CHLORIDE 0.9 % IV BOLUS
1000.0000 mL | Freq: Once | INTRAVENOUS | Status: AC
  Administered 2018-07-12: 19:00:00 1000 mL via INTRAVENOUS

## 2018-07-12 MED ORDER — ACETAMINOPHEN 325 MG PO TABS
650.0000 mg | ORAL_TABLET | Freq: Once | ORAL | Status: AC
  Administered 2018-07-12: 19:00:00 650 mg via ORAL
  Filled 2018-07-12: qty 2

## 2018-07-12 MED ORDER — ACETAMINOPHEN 650 MG RE SUPP: 650.0000 mg | Freq: Four times a day (QID) | RECTAL | Status: DC | PRN

## 2018-07-12 MED ORDER — RIVAROXABAN 15 MG PO TABS
15.0000 mg | ORAL_TABLET | Freq: Every day | ORAL | Status: DC
  Administered 2018-07-13: 17:00:00 15 mg via ORAL
  Filled 2018-07-12 (×2): qty 1

## 2018-07-12 MED ORDER — VANCOMYCIN HCL IN DEXTROSE 1-5 GM/200ML-% IV SOLN
1000.0000 mg | Freq: Once | INTRAVENOUS | Status: AC
  Administered 2018-07-12: 19:00:00 1000 mg via INTRAVENOUS
  Filled 2018-07-12: qty 200

## 2018-07-12 MED FILL — XARELTO 15 MG TABLET: 15 | 30 days supply | Qty: 30 | Fill #0

## 2018-07-12 MED FILL — EPINEPHRINE 0.3 MG AUTO-INJ: 0.3 | 2 days supply | Qty: 2 | Fill #0

## 2018-07-12 NOTE — Care Management CC44 (Signed)
Condition Code 44 Documentation Completed  Patient Details  Name: John Davenport MRN: [id number] Date of Birth: 01-01-1976   Condition Code 44 given:  Yes Patient signature on Condition Code 44 notice:  Yes Documentation of 2 MD's agreement:  Yes Code 44 added to claim:  Yes    Dawayne Patricia, RN 07/12/2018, 11:45 AM

## 2018-07-12 NOTE — Discharge Summary (Signed)
Name: John Davenport MRN: [id number] DOB: Jan 17, 1976 43 y.o. PCP: Patient, No Pcp Per  Date of Admission: 07/11/2018  6:17 PM Date of Discharge: 07/12/2018 Attending Physician: Oda Kilts, MD  Discharge Diagnosis: 1. Anaphylactic Shock 2. Atypical chest pain 3. AKI 4. Hx of CVA  Discharge Medications: Allergies as of 07/12/2018   No Known Allergies     Medication List    TAKE these medications   amiodarone 200 MG tablet Commonly known as:  PACERONE Take 1 tablet (200 mg total) by mouth 2 (two) times daily.   carvedilol 6.25 MG tablet Commonly known as:  COREG Take 1 tablet (6.25 mg total) by mouth 2 (two) times daily.   digoxin 0.125 MG tablet Commonly known as:  Lanoxin Take 1 tablet (125 mcg total) by mouth daily.   EPINEPHrine 0.3 mg/0.3 mL Soaj injection Commonly known as:  EPI-PEN Inject 0.3 mLs (0.3 mg total) into the muscle as needed for anaphylaxis.   furosemide 40 MG tablet Commonly known as:  LASIX Take 1 tablet (40 mg total) by mouth 2 (two) times daily.   losartan 25 MG tablet Commonly known as:  COZAAR Take 1 tablet (25 mg total) by mouth 2 (two) times daily.   nitroGLYCERIN 0.4 MG SL tablet Commonly known as:  NITROSTAT Place 1 tablet (0.4 mg total) under the tongue every 5 (five) minutes as needed for chest pain.   potassium chloride SA 20 MEQ tablet Commonly known as:  K-DUR,KLOR-CON Take 1 tablet (20 mEq total) by mouth daily.   Rivaroxaban 15 MG Tabs tablet Commonly known as:  XARELTO Take 1 tablet (15 mg total) by mouth daily with supper for 30 days. Restart on Thursday morning. What changed:    medication strength  how much to take   spironolactone 25 MG tablet Commonly known as:  ALDACTONE Take 1 tablet (25 mg total) by mouth at bedtime.       Disposition and follow-up:   John Davenport was discharged from [facility] in Stable condition.  At the hospital follow up visit please address:   1.  Hx of CVA: 2/2 cardio embolic source, xarelto dose was decreased from 20 mg to 15 mg due to CrCl of 51.5. Please recheck his kidney function, if this has improved may increase xarelto back to 20 mg   2.  Labs / imaging needed at time of follow-up: BMP  3.  Pending labs/ test needing follow-up: blood cultures  Follow-up Appointments: Follow-up Information    Larey Dresser, MD. Schedule an appointment as soon as possible for a visit in 1 week(s).   Specialty:  Cardiology Contact information: [address] [place] [address] [place] Hospital Course by problem list: 1. Anaphylactic Shock- Patient presented with a full body urticarial rash, hypotension, nausea, vomiting and chest pain. He was given epinephrine, solumedorl and benadryl with resolution of his rash but his BP did not improve. He was treated with IVF. Unclear source. Questionable history of IVDU and unable to obtain UDS. Discharged with an epi-pen and referral to allergy.  2. Atypical chest pain-Troponin's were negative and EKG did not show any significant changes from prior. He had a L/R cath 01/2018 with stable CAD. Has ICD in place. Most likely due to decreased perfusion in the setting of hypotension from anaphylaxis. Will need to follow up with his cardiologist for medication reconciliation.  3. AKI- Cr 1.69  on admission, baseline 1-1.2. Euvolemic on exam. Likely 2/2 dehydration. Will need bmp at follow up.  4. 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 Will need to check bmp at follow up and consider increasing xarelto back to 20 mg.   Discharge Vitals:   BP (!) 82/54 (BP Location: Left Arm)   Pulse 83   Temp 98.1 F (36.7 C) (Oral)   Resp 17   Ht 6\' 3"  (1.905 m)   Wt 68 kg   SpO2 97%   BMI 18.75 kg/m   Pertinent Labs, Studies, and Procedures:   CBC Latest Ref Rng & Units 07/12/2018 07/11/2018 01/27/2018  WBC  4.0 - 10.5 K/uL 14.8(H) 10.8(H) 9.6  Hemoglobin 13.0 - 17.0 g/dL 13.1 14.4 13.5  Hematocrit 39.0 - 52.0 % 40.8 44.6 42.1  Platelets 150 - 400 K/uL 107(L) 147(L) 175   CMP Latest Ref Rng & Units 07/12/2018 07/11/2018 02/17/2018  Glucose 70 - 99 mg/dL 119(H) 102(H) 85  BUN 6 - 20 mg/dL 23(H) 16 27(H)  Creatinine 0.61 - 1.24 mg/dL 1.76(H) 1.69(H) 1.42(H)  Sodium 135 - 145 mmol/L 135 132(L) 136  Potassium 3.5 - 5.1 mmol/L 4.5 3.7 4.8  Chloride 98 - 111 mmol/L 98 98 98  CO2 22 - 32 mmol/L 25 22 30   Calcium 8.9 - 10.3 mg/dL 8.6(L) 8.8(L) 9.3  Total Protein 6.5 - 8.1 g/dL - 7.1 -  Total Bilirubin 0.3 - 1.2 mg/dL - 1.2 -  Alkaline Phos 38 - 126 U/L - 65 -  AST 15 - 41 U/L - 42(H) -  ALT 0 - 44 U/L - 36 -   Dg Chest Portable 1 View  Result Date: 07/11/2018 CLINICAL DATA:  Chest pain for several hours EXAM: PORTABLE CHEST 1 VIEW COMPARISON:  05/22/2016 FINDINGS: Cardiac shadow is within normal limits. Postsurgical changes are noted. Defibrillator is again noted and stable. The lungs are well aerated without focal infiltrate or sizable effusion. No acute bony abnormality is noted. IMPRESSION: No active disease. Electronically Signed   By: Inez Catalina M.D.   On: 07/11/2018 20:16    Discharge Instructions: Discharge Instructions    Ambulatory referral to Allergy   Complete by:  As directed    Patient had anaphylactic shock, unclear cause   Diet - low sodium heart healthy   Complete by:  As directed    Discharge instructions   Complete by:  As directed    John. [name], Davenport were hospitalized most likely due to anaphylactic shock, which is an allergic reaction. We are not sure what caused this to happen. I have sent in a referral to an allergy specialist to help determine the cause and prevent this from happening again.   I have also sent in a prescription for an Epi-pen for you in case you have another allergic reaction. Please make sure to read the instructions of how and when to use this pen  if you are to have another allergic reaction.   I have also decreased the dose of your Xarelto from 20 mg to 15 mg for one month due to worsening of your kidney function. I want you to follow up closely with your PCP and once your kidney function improves we can increase your dose again.  I also want you to follow up with your heart failure doctor as soon as possible. Make sure to continue all of your other medications as prescribed.  Thank you for allowing [place] to be a part of your care!   Increase activity slowly   Complete by:  As directed       Signed: Osie Merkin N, DO 07/12/2018, 11:05 AM

## 2018-07-12 NOTE — Telephone Encounter (Addendum)
Patient was afebrile and all of his symptoms improved overnight. He denied any pain and had no more episodes of chest pain. He was discharged with a diagnosis of anaphylaxis. Shortly after being discharged his blood cultures resulted with 4/4 GPC.  Spoke with John Davenport regarding his blood cultures and recommended he come back to the hospital for a direct readmission. Patient declined and stated he was going to go home and would consider coming back tomorrow or the next day. He was informed that 4/4 blood cultures grew gram positive cocci and that if he did not return for IV antibiotic treatment this could lead to very serious complications and even death. He expressed understanding and declined coming back to the hospital.

## 2018-07-12 NOTE — ED Notes (Signed)
ED TO INPATIENT HANDOFF REPORT  ED Nurse Name and Phone #:  Callie Fielding [phone] Name/Age/Gender John Davenport 43 y.o. male Room/Bed: 029C/029C  Code Status   Code Status: Full Code  Home/SNF/Other Home Patient oriented to: self, place, time and situation Is this baseline? Yes   Triage Complete: Triage complete  Chief Complaint chest pain/hives  Triage Note Pt here from home. Pt complains of chest pain and hives. Pt states it started at 4am this morning   Allergies No Known Allergies  Level of Care/Admitting Diagnosis ED Disposition    ED Disposition Condition [facility] Area: [place] [[id number]]  Level of Care: Progressive [102]  Diagnosis: Chest pain [426834]  Admitting Physician: Oda Kilts [[id number]]  Attending Physician: Oda Kilts [[id number]]  Estimated length of stay: past midnight tomorrow  Certification:: I certify this patient will need inpatient services for at least 2 midnights  PT Class (Do Not Modify): Inpatient [101]  PT Acc Code (Do Not Modify): Private [1]       B Medical/Surgery History Past Medical History:  Diagnosis Date  . Anxiety state, unspecified   . Back pain   . Cancer ([place])    Lung CA  . Chronic systolic heart failure ([place])    a. Ischemic CM (EF 30% by echo in 2011) b. echo 08/2015: EF 20% with severe HK and AK of the inferoseptal and apical myocardium.  . Coronary artery disease    a. 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  b. eventually required single vessel CABG (L-LAD) in 03/2009 due to restenosis. c. cath 2013 showed patent LIMA sequential to the diags with LAD filling retrograde, no new disease in RCA and Cx, low LVEDP, sx felt noncardiac. d. 08/2015: NSTEMI: CTO of prox LAD, 99% stenosis of small dCx (no stent), + for cocaine  . Esophageal reflux   . Heart attack ([place])   . Hypercholesteremia   . ICD (implantable  cardioverter-defibrillator) infection ([place])    a. [place] Sci ICD 12/2008.   . Ischemic cardiomyopathy    echocardiogram 12/11: Mild LVH, EF 30-35%, anteroseptal and apical akinesis, grade 1 diastolic dysfunction  . LBP (low back pain)   . OSA (obstructive sleep apnea) 12/30/2010  . Paroxysmal ventricular tachycardia ([place])   . Stroke ([place])   . Tobacco abuse    Past Surgical History:  Procedure Laterality Date  . CARDIAC CATHETERIZATION N/A 09/06/2015   Procedure: Left Heart Cath and Cors/Grafts Angiography;  Surgeon: Sherren Mocha, MD;  Location: [facility];  Service: Cardiovascular;  Laterality: N/A;  . CARDIAC DEFIBRILLATOR PLACEMENT    . CARDIAC DEFIBRILLATOR PLACEMENT     2010 by JA  . CORONARY ANGIOPLASTY WITH STENT PLACEMENT     LAD stenting 10/09 and re-opening 6/10  . CORONARY ARTERY BYPASS GRAFT     1/11  . IR GENERIC HISTORICAL  05/21/2016   IR PERCUTANEOUS ART THROMBECTOMY/INFUSION INTRACRANIAL INC DIAG ANGIO 05/21/2016 Luanne Bras, MD [facility]  . IR GENERIC HISTORICAL  06/16/2016   IR RADIOLOGIST EVAL & MGMT 06/16/2016 [facility]  . LEFT HEART CATHETERIZATION WITH CORONARY ANGIOGRAM N/A 08/07/2011   Procedure: LEFT HEART CATHETERIZATION WITH CORONARY ANGIOGRAM;  Surgeon: Josue Hector, MD;  Location: [facility];  Service: Cardiovascular;  Laterality: N/A;  . RADIOLOGY WITH ANESTHESIA N/A 05/21/2016   Procedure: RADIOLOGY WITH ANESTHESIA;  Surgeon: Luanne Bras, MD;  Location: [facility];  Service: Radiology;  Laterality: N/A;  . RIGHT/LEFT HEART CATH AND CORONARY ANGIOGRAPHY N/A 02/03/2018   Procedure: RIGHT/LEFT HEART CATH AND CORONARY ANGIOGRAPHY;  Surgeon: Larey Dresser, MD;  Location: [facility];  Service: Cardiovascular;  Laterality: N/A;     A IV Location/Drains/Wounds Patient Lines/Drains/Airways Status   Active Line/Drains/Airways    Name:   Placement date:   Placement time:   Site:   Days:   Peripheral IV 05/21/16 Left Antecubital    05/21/16    -    Antecubital   782   Peripheral IV 05/21/16 Right Hand   05/21/16    1836    Hand   782   Peripheral IV 07/11/18 Right Antecubital   07/11/18    1838    Antecubital   1          Intake/Output Last 24 hours No intake or output data in the 24 hours ending 07/12/18 0035  Labs/Imaging Results for orders placed or performed during the hospital encounter of 07/11/18 (from the past 48 hour(s))  CBC with Differential     Status: Abnormal   Collection Time: 07/11/18  6:41 PM  Result Value Ref Range   WBC 10.8 (H) 4.0 - 10.5 K/uL   RBC 4.66 4.22 - 5.81 MIL/uL   Hemoglobin 14.4 13.0 - 17.0 g/dL   HCT 44.6 39.0 - 52.0 %   MCV 95.7 80.0 - 100.0 fL   MCH 30.9 26.0 - 34.0 pg   MCHC 32.3 30.0 - 36.0 g/dL   RDW 14.5 11.5 - 15.5 %   Platelets 147 (L) 150 - 400 K/uL   nRBC 0.0 0.0 - 0.2 %   Neutrophils Relative % 94 %   Neutro Abs 10.0 (H) 1.7 - 7.7 K/uL   Lymphocytes Relative 5 %   Lymphs Abs 0.5 (L) 0.7 - 4.0 K/uL   Monocytes Relative 1 %   Monocytes Absolute 0.2 0.1 - 1.0 K/uL   Eosinophils Relative 0 %   Eosinophils Absolute 0.0 0.0 - 0.5 K/uL   Basophils Relative 0 %   Basophils Absolute 0.0 0.0 - 0.1 K/uL   Immature Granulocytes 0 %   Abs Immature Granulocytes 0.04 0.00 - 0.07 K/uL    Comment: Performed at [facility], [address], [place], [place] [address]  Basic metabolic panel     Status: Abnormal   Collection Time: 07/11/18  6:41 PM  Result Value Ref Range   Sodium 132 (L) 135 - 145 mmol/L   Potassium 3.7 3.5 - 5.1 mmol/L   Chloride 98 98 - 111 mmol/L   CO2 22 22 - 32 mmol/L   Glucose, Bld 102 (H) 70 - 99 mg/dL   BUN 16 6 - 20 mg/dL   Creatinine, Ser 1.69 (H) 0.61 - 1.24 mg/dL   Calcium 8.8 (L) 8.9 - 10.3 mg/dL   GFR calc non Af Amer 48 (L) >60 mL/min   GFR calc Af Amer 56 (L) >60 mL/min   Anion gap 12 5 - 15    Comment: Performed at [facility] [address] [place], [place] [address]  Ethanol     Status: None   Collection Time:  07/11/18  6:41 PM  Result Value Ref Range   Alcohol, Ethyl (B) <10 <10 mg/dL    Comment: (NOTE) Lowest detectable limit for serum alcohol is 10 mg/dL. For medical purposes only. Performed at [facility], [place] [address], [place], [place] [address]  Hepatic function panel     Status: Abnormal   Collection Time: 07/11/18  7:12 PM  Result Value Ref Range   Total Protein 7.1 6.5 - 8.1 g/dL   Albumin 2.9 (L) 3.5 - 5.0 g/dL   AST 42 (H) 15 - 41 U/L   ALT 36 0 - 44 U/L   Alkaline Phosphatase 65 38 - 126 U/L   Total Bilirubin 1.2 0.3 - 1.2 mg/dL   Bilirubin, Direct 0.4 (H) 0.0 - 0.2 mg/dL   Indirect Bilirubin 0.8 0.3 - 0.9 mg/dL    Comment: Performed at [facility] [address], [place], [place] [address]  Lipase, blood     Status: None   Collection Time: 07/11/18  7:12 PM  Result Value Ref Range   Lipase 22 11 - 51 U/L    Comment: Performed at [facility] [address] [place], [place] [address]  Troponin I - ONCE - STAT     Status: None   Collection Time: 07/11/18  7:12 PM  Result Value Ref Range   Troponin I <0.03 <0.03 ng/mL    Comment: Performed at [facility] [address] [place], [place] [address]  Digoxin level     Status: Abnormal   Collection Time: 07/11/18 10:08 PM  Result Value Ref Range   Digoxin Level <0.2 (L) 0.8 - 2.0 ng/mL    Comment: RESULTS CONFIRMED BY MANUAL DILUTION Performed at [facility], [place] [address] [place], [place] [address]    Dg Chest Portable 1 View  Result Date: 07/11/2018 CLINICAL DATA:  Chest pain for several hours EXAM: PORTABLE CHEST 1 VIEW COMPARISON:  05/22/2016 FINDINGS: Cardiac shadow is within normal limits. Postsurgical changes are noted. Defibrillator is again noted and stable. The lungs are well aerated without focal infiltrate or sizable effusion. No acute bony abnormality is noted. IMPRESSION: No active disease. Electronically Signed   By: Inez Catalina M.D.   On: 07/11/2018 20:16     Pending Labs Unresulted Labs (From admission, onward)    Start     Ordered   07/12/18 2248  Basic metabolic panel  Tomorrow morning,   R     07/11/18 2232   07/12/18 0500  HIV antibody (Routine Testing)  Tomorrow morning,   R     07/11/18 2343   07/12/18 0000  Troponin I - Now Then Q6H  Now then every 6 hours,   R     07/11/18 2240   07/11/18 2353  Brain natriuretic peptide  Add-on,   R     07/11/18 2352   07/11/18 2335  TSH  Add-on,   R     07/11/18 2343   07/11/18 2319  CBC with Differential/Platelet  Once,   R     07/11/18 2319   07/11/18 2231  Culture, blood (routine x 2)  BLOOD CULTURE X 2,   R     07/11/18 2231   07/11/18 1838  Rapid urine drug screen (hospital performed)  ONCE - STAT,   R     07/11/18 1837          Vitals/Pain Today's Vitals   07/11/18 2315 07/11/18 2330 07/11/18 2345 07/12/18 0000  BP: (!) 71/51 (!) 71/51 (!) 73/49 (!) 79/46  Pulse: 77 80 78 (!) 57  Resp: (!) 21 18 18  (!) 21  Temp:      TempSrc:      SpO2: 98% 95% 98% 100%  Weight:  Height:      PainSc:        Isolation Precautions No active isolations  Medications Medications  0.9 %  sodium chloride infusion ( Intravenous New Bag/Given 07/12/18 0021)  rivaroxaban (XARELTO) tablet 20 mg (has no administration in time range)  diphenhydrAMINE (BENADRYL) injection 25 mg (25 mg Intravenous Given 07/11/18 1848)  EPINEPHrine (EPI-PEN) injection 0.3 mg (0.3 mg Intramuscular Given 07/11/18 1847)  methylPREDNISolone sodium succinate (SOLU-MEDROL) 125 mg/2 mL injection 125 mg (125 mg Intravenous Given 07/11/18 1847)  famotidine (PEPCID) IVPB 20 mg in NS 100 mL IVPB (0 mg Intravenous Stopped 07/11/18 1926)    Mobility walks Low fall risk   Focused Assessments Cardiac Assessment Handoff:    Lab Results  Component Value Date   CKTOTAL 84 11/21/2010   CKMB 2.2 11/21/2010   TROPONINI <0.03 07/11/2018   No results found for: DDIMER Does the Patient currently have chest pain? No      R Recommendations: See Admitting Provider Note  Report given to:   Additional Notes:

## 2018-07-12 NOTE — H&P (Signed)
Date: 07/12/2018               Patient Name:  John Davenport MRN: [id number]  DOB: [birth year] Age / Sex: 43 y.o., male   PCP: Patient, No Pcp Per         Medical Service: Internal Medicine Teaching Service         Attending Physician: Dr. Rebeca Alert, Raynaldo Opitz, MD    First Contact: [name], Davenport Pager: [phone]  Second Contact: Dr. 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 He was admitted yesterday at [facility] for presumed anaphylaxis with hives and chest pain which was treated with solumedrol, epinephrine, benadryl and fluids and was discharged early today after negative trops but his blood culture result came back positive for MSSA. He was contacted by primary team with recommendation to return to the hospital and he now presents to [facility] for further treatment.   He was feeling like his usual self upon returning home but then had the return of urticaria and fevers prior to return without obvious inciting event. He again denies eating anything out of the ordinary or changes in toiletry products. Denies any travel or outdoor exposure. Denies any illicit substance use. Denies any reoccurrence of his chest pain.  In the ED, he was noted to have a fever 102.2, HR 127, BP 90/48. Labs were significant for Na 129, Crt 2.26 ( baseline 0.9), bicarb 20, anion gap 14, WBC 9.8, and plt 95. He was given a 1L NS bolus and dose of vancomycin.  Meds: Amiodarone 200mg  BID Carvedilol 6.25mg  BID Digoxin 0.125mg  daily Furosemide 40mg  daily Losartan 25mg  daily Nitroglycerin 0.4mg  SL Prn K-dur 21mEq daily Xarelto 20mg  daily Spironolactone 25mg   daily  Allergies: Allergies as of 07/12/2018  . (No Known Allergies)   Past Medical History:  Diagnosis Date  . Anxiety state, unspecified   . Back pain   . Cancer ([place])    Lung CA  . Chronic systolic heart failure ([place])    a. Ischemic CM (EF 30% by echo in 2011) b. echo 08/2015: EF 20% with severe HK and AK of the inferoseptal and apical myocardium.  . Coronary artery disease    a. 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  b. eventually required single vessel CABG (L-LAD) in 03/2009 due to restenosis. c. cath 2013 showed patent LIMA sequential to the diags with LAD filling retrograde, no new disease in RCA and Cx, low LVEDP, sx felt noncardiac. d. 08/2015: NSTEMI: CTO of prox LAD, 99% stenosis of small dCx (no stent), + for cocaine  . Esophageal reflux   . Heart attack ([place])   . Hypercholesteremia   . ICD (implantable cardioverter-defibrillator) infection ([place])    a. [place] Sci ICD 12/2008.   . Ischemic cardiomyopathy    echocardiogram 12/11: Mild LVH, EF 30-35%, anteroseptal and apical akinesis, grade 1 diastolic dysfunction  . LBP (low back pain)   . OSA (obstructive sleep apnea) 12/30/2010  . Paroxysmal ventricular tachycardia ([place])   . Stroke ([place])   . Tobacco abuse  Family History:  Father w/ early cardiac disease in 73s.  Social History: On disability. States he has twins at home he needs to take care of that's why he left early today. 30 pack year smoking hx. Currently smoking pack a day. Hx of marijuana use. No alcohol.  Review of Systems: A complete ROS was negative except as per HPI.  Physical Exam: Blood pressure (!) 84/53, pulse (!) 121, temperature (!) 101.1 F (38.4 C), temperature source Oral, resp. rate 20, height 6\' 3"  (1.905 m), weight 68 kg, SpO2 98 %.   Gen: Well-developed, well nourished, NAD HEENT: MMM, poor dentition Neck: supple, ROM intact, no JVD, no cervical adenopathy CV: Tachycardic, regula rhythm, S1,  S2 normal, no murmurs, gallops, rubs Pulm: Distant breath sounds, no rales, wheezes Abd: Soft, BS+, NTND, No rebound, no guarding Extm: ROM intact, Peripheral pulses intact, No peripheral edema Skin: Raised urticarial lesions all around chest, abdomen, back and arms and legs. 2 erythematous, tense nodule noted on left upper extremity which he states is chronic. Refer to photos below. Noted upper extremity track marks.         EKG: personally reviewed my interpretation is sinus tachycardia, left axis deviation, wide QRS, no significant change from prior EKG except for some more artifact.  CXR: personally reviewed my interpretation is no lobar consolidation, no pleural effusion, no vascular congestion, no change from yesterday's.  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 Will require inpatient stay for IV abx and sepsis work-up.  Hypotension, Urticarial rash 2/2 MSSA bacteremia Current BP 84/53. Improved compared to yesterday. BCID from yesterday growing MSSA. Blood culture growing gram positve cocci. Questionable hx of IVDU. WBC 9.8 w/ left shift. Received 1 dose of vancomycin in ED. - Narrow to nafcillin starting tomorrow unless blood culture inconsistent with biofire result - F/u blood cultures - Trend CBC - Repeat blood culture after 48 hrs - TTE - Hold home meds: furosemide, digoxin, amiodarone, losartan, carvedilol, spironolactone  AKI 2/2 dehydration vs cardio-renal Worsening creatinine since 2 days ago. Baseline 1-1.2. Current creatinine 2.26 - Received 1L bolus in ED - Trend renal function - Caution for gentle hydration considering reduced cardiac function  Heart Failure with  reduced Ejection Fraction 20% EF. On lasix 20mg , digoxin, amiodraone, losartan, carvedilol, spironolactone. Mentions difficutly affording meds at home. Denies respiratory distress - Telemetry - Hold home meds for now - I&Os - Daily weights  Hx of CVA 2/2 cardio-embolic source On Xarelto at home. Dose adjusted for impaired renal function today. - C/w rivaroxaban 15mg   DVT prophx: Xarelto Diet: Cardiac Bowel: Senokot Code: Full  Dispo: Admit patient to Inpatient with expected length of stay greater than 2 midnights.  Signed: Mosetta Anis, MD 07/12/2018, 9:19 PM  Pager: [phone]

## 2018-07-12 NOTE — ED Notes (Signed)
Admitting MDs at bedside.

## 2018-07-12 NOTE — ED Notes (Signed)
Spoke with charge and they are aware of the floor having an issue on the floor and they will call me when they are ready for this patient.

## 2018-07-12 NOTE — ED Triage Notes (Signed)
Pt states he was here yesterday due to CP and hives. Pt states he was called and told to come back due to infection in his blood and needing abx. Hives are back.

## 2018-07-12 NOTE — Care Management Obs Status (Signed)
[facility] NOTIFICATION   Patient Details  Name: John Davenport MRN: [id number] Date of Birth: 1975/06/09   Medicare Observation Status Notification Given:  Yes    Dawayne Patricia, RN 07/12/2018, 11:45 AM

## 2018-07-12 NOTE — Progress Notes (Signed)
   Subjective: Patient reported feeling well this morning. Denies any more rashes, chest pain, n/v. States he feels back to his baseline except he knows his BP was low. He denies any lightheadedness or dizziness but has not ambulated today. States he has not urinated since admission.   Objective:  Vital signs in last 24 hours: Vitals:   07/12/18 0147 07/12/18 0652 07/12/18 0738 07/12/18 0849  BP: (!) 75/60 (!) 89/56 (!) 88/55 (!) 82/54  Pulse: 74 84 83   Resp:  17    Temp: 97.7 F (36.5 C) 98.5 F (36.9 C) 98.1 F (36.7 C)   TempSrc: Oral Oral Oral   SpO2: 98% 97% 97%   Weight:      Height:       Physical Exam Gen: seen comfortably resting, no distress CV: RRR, no murmurs Abdomen: soft, non tender, bowel sounds present   Assessment/Plan:  Principal Problem:   Anaphylaxis Active Problems:   Chest pain  John Davenport is a 43 yo M w/ PMH of CAD s/p CABG, NSTEMi w/ cocain use, Ischemic cardiomyopathy s/p ICD, HFrEF, COPD, 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 He also had one episode of chest pain, troponins negative with no significant EKG changes. Likely due to decreased perfusion in the setting of hypotension.  Anaphylactic Shock: Patient presented with an urticarial rash, nausea, vomiting, chest pain and hypotension treated with benadryl, steroids and epinephrine. Symptoms all resolved this morning. BP 89/56. Patient denies any changes to diet, medications, soaps/detergents. Denies urinating since admission for UDS.  - TSH wnl - Blood cultures pending  - Hold home meds: furosemide, digoxin, amiodarone, losartan, carvedilol, spironolactone  Atypical chest pain: Patient denies any more episodes of chest pain. Troponins negative x2. EKG shows no significant change. L/R cath 01/2018 w/ stable CAD. Has ICD in place. Prior hx of NSTEMI 2/2 concurrent cocaine use s/p CABG. Chest pain likely  exacerbated in the setting of decreased perfusion 2/2 to hypotension from anaphylaxis.  - Telemetry  AKI 2/2 dehydration vs cardio-renal: Euvolemic on exam. Cr 1.76. Baseline 1-1.2 - Hold nephrotoxic meds - Trend Bmp  Hx of CVA 2/2 cardio-embolic source: On Xarelto at home - Will lower Xarelto dose to 15mg , CrCL is 51.5. Can increase dose when his kidney function improves/returns to baseline   Dispo: Patient is medically stable for discharge today.   Mike Craze, DO 07/12/2018, 10:32 AM Pager: [phone]

## 2018-07-12 NOTE — ED Provider Notes (Signed)
[facility] EMERGENCY DEPARTMENT Provider Note   CSN: [id number] Arrival date & time: 07/12/18  1809    History   Chief Complaint Chief Complaint  Patient presents with  . Abnormal Lab    HPI John Davenport is a 43 y.o. male.     HPI Patient presents 1 day after being discharged from this facility now with concern of generalized discomfort, and diffuse urticaria. Patient knowledges history of cardiac disease, smoking, marijuana use. He denies use of any illicit drugs beyond marijuana.  Patient was recently admitted to this facility, treated for allergic reaction. Reported the patient has had positive blood culture result, and was referred back for therapy. He developed new urticarial lesions about 1 hour ago, has not taken any meds for relief. He denies interval chest pain, dyspnea, several times states that he feels poor all over. He is unaware of fever, as well.   Past Medical History:  Diagnosis Date  . Anxiety state, unspecified   . Back pain   . Cancer ([place])    Lung CA  . Chronic systolic heart failure ([place])    a. Ischemic CM (EF 30% by echo in 2011) b. echo 08/2015: EF 20% with severe HK and AK of the inferoseptal and apical myocardium.  . Coronary artery disease    a. 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  b. eventually required single vessel CABG (L-LAD) in 03/2009 due to restenosis. c. cath 2013 showed patent LIMA sequential to the diags with LAD filling retrograde, no new disease in RCA and Cx, low LVEDP, sx felt noncardiac. d. 08/2015: NSTEMI: CTO of prox LAD, 99% stenosis of small dCx (no stent), + for cocaine  . Esophageal reflux   . Heart attack ([place])   . Hypercholesteremia   . ICD (implantable cardioverter-defibrillator) infection ([place])    a. [place] [employer] ICD 12/2008.   . Ischemic cardiomyopathy    echocardiogram 12/11: Mild LVH, EF 30-35%, anteroseptal and apical akinesis, grade 1  diastolic dysfunction  . LBP (low back pain)   . OSA (obstructive sleep apnea) 12/30/2010  . Paroxysmal ventricular tachycardia ([place])   . Stroke ([place])   . Tobacco abuse     Patient Active Problem List   Diagnosis Date Noted  . Anaphylaxis 07/12/2018  . Bacteremia due to methicillin susceptible Staphylococcus aureus (MSSA) 07/12/2018  . Chest pain 07/11/2018  . Encounter for therapeutic drug monitoring 06/09/2016  . Embolic stroke ([place]) 35/36/1443  . Hypotension 05/23/2016  . Smokeless tobacco use 05/23/2016  . Cocaine abuse ([place]) 05/23/2016  . Cerebrovascular accident (CVA) due to thrombosis of left middle cerebral artery (HCC) s/p IV tPA and mechanical thrombectomy   . Chronic systolic congestive heart failure ([place])   . Cough with sputum 04/17/2016  . Intractable upper abdominal pain 03/09/2016  . CKD (chronic kidney disease), stage III ([place]) 03/09/2016  . 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 RUQ abdominal pain 03/09/2016  . NSTEMI (non-ST elevated myocardial infarction) ([place]) 09/05/2015  . Ischemic cardiomyopathy 09/05/2015  . Tobacco abuse 09/05/2015  . Hyperlipidemia 09/05/2015  . Renal insufficiency 09/05/2015  . OSA (obstructive sleep apnea) 12/30/2010  . Cough 12/09/2010  . Dental abscess 12/09/2010  . GERD 04/10/2010  . PAROXYSMAL VENTRICULAR TACHYCARDIA 03/22/2010  . Chronic combined systolic and diastolic CHF (congestive heart failure) ([place]) 03/22/2010  . Generalized anxiety disorder 05/16/2009  . MYOCARDIAL INFARCTION 12/19/2008  . CAD in native artery 12/19/2008    Past  Surgical History:  Procedure Laterality Date  . CARDIAC CATHETERIZATION N/A 09/06/2015   Procedure: Left Heart Cath and Cors/Grafts Angiography;  Surgeon: Sherren Mocha, MD;  Location: [facility];  Service: Cardiovascular;  Laterality: N/A;  . CARDIAC DEFIBRILLATOR PLACEMENT    . CARDIAC DEFIBRILLATOR PLACEMENT     2010 by JA  . CORONARY ANGIOPLASTY WITH STENT PLACEMENT      LAD stenting 10/09 and re-opening 6/10  . CORONARY ARTERY BYPASS GRAFT     1/11  . IR GENERIC HISTORICAL  05/21/2016   IR PERCUTANEOUS ART THROMBECTOMY/INFUSION INTRACRANIAL INC DIAG ANGIO 05/21/2016 Luanne Bras, MD [facility]  . IR GENERIC HISTORICAL  06/16/2016   IR RADIOLOGIST EVAL & MGMT 06/16/2016 [facility]  . LEFT HEART CATHETERIZATION WITH CORONARY ANGIOGRAM N/A 08/07/2011   Procedure: LEFT HEART CATHETERIZATION WITH CORONARY ANGIOGRAM;  Surgeon: Josue Hector, MD;  Location: [facility] CATH [facility];  Service: Cardiovascular;  Laterality: N/A;  . RADIOLOGY WITH ANESTHESIA N/A 05/21/2016   Procedure: RADIOLOGY WITH ANESTHESIA;  Surgeon: Luanne Bras, MD;  Location: [facility];  Service: Radiology;  Laterality: N/A;  . RIGHT/LEFT HEART CATH AND CORONARY ANGIOGRAPHY N/A 02/03/2018   Procedure: RIGHT/LEFT HEART CATH AND CORONARY ANGIOGRAPHY;  Surgeon: Larey Dresser, MD;  Location: [facility];  Service: Cardiovascular;  Laterality: N/A;        Home Medications    Prior to Admission medications   Medication Sig Start Date End Date Taking? Authorizing Provider  amiodarone (PACERONE) 200 MG tablet Take 1 tablet (200 mg total) by mouth 2 (two) times daily. 02/17/18   Shirley Friar, PA-C  carvedilol (COREG) 6.25 MG tablet Take 1 tablet (6.25 mg total) by mouth 2 (two) times daily. 01/27/18 04/27/18  Shirley Friar, PA-C  digoxin (LANOXIN) 0.125 MG tablet Take 1 tablet (125 mcg total) by mouth daily. 02/03/18 02/03/19  Larey Dresser, MD  EPINEPHrine 0.3 mg/0.3 mL IJ SOAJ injection Inject 0.3 mLs (0.3 mg total) into the muscle as needed for anaphylaxis. 07/12/18   Rehman, Areeg N, DO  furosemide (LASIX) 40 MG tablet Take 1 tablet (40 mg total) by mouth 2 (two) times daily. 02/03/18 05/04/18  Larey Dresser, MD  losartan (COZAAR) 25 MG tablet Take 1 tablet (25 mg total) by mouth 2 (two) times daily. 02/18/18 05/19/18  Shirley Friar, PA-C  nitroGLYCERIN (NITROSTAT)  0.4 MG SL tablet Place 1 tablet (0.4 mg total) under the tongue every 5 (five) minutes as needed for chest pain. Patient not taking: Reported on 02/17/2018 09/24/15   Lyda Jester M, PA-C  potassium chloride SA (K-DUR,KLOR-CON) 20 MEQ tablet Take 1 tablet (20 mEq total) by mouth daily. 01/27/18 04/27/18  Shirley Friar, PA-C  rivaroxaban (XARELTO) 15 MG TABS tablet Take 1 tablet (15 mg total) by mouth daily with supper for 30 days. Restart on Thursday morning. 07/12/18 08/11/18  Rehman, Areeg N, DO  spironolactone (ALDACTONE) 25 MG tablet Take 1 tablet (25 mg total) by mouth at bedtime. 01/27/18   Shirley Friar, PA-C    Family History Family History  Problem Relation Age of Onset  . Heart attack Father 55       died at 22 after multiple MI's  . COPD Maternal Grandmother     Social History Social History   Tobacco Use  . Smoking status: Current Every Day Smoker    Packs/day: 0.50    Years: 20.00    Pack years: 10.00    Types: Cigarettes  .  Smokeless tobacco: Former Systems developer    Types: Chew  . Tobacco comment: smoke 0.5 per week  Substance Use Topics  . Alcohol use: No  . Drug use: Yes    Types: Cocaine     Allergies   Patient has no known allergies.   Review of Systems Review of Systems  Constitutional:       Per HPI, otherwise negative  HENT:       Per HPI, otherwise negative  Respiratory:       Per HPI, otherwise negative  Cardiovascular:       Per HPI, otherwise negative  Gastrointestinal: Negative for vomiting.  Endocrine:       Negative aside from HPI  Genitourinary:       Neg aside from HPI   Musculoskeletal:       Per HPI, otherwise negative  Skin: Positive for color change and rash.  Allergic/Immunologic:       Denies immunocompromised state  Neurological: Negative for syncope.  Psychiatric/Behavioral: The patient is nervous/anxious.        Denies drug use beyond marijuana     Physical Exam Updated Vital Signs BP (!) 90/48    Pulse (!) 127   Temp (!) 102.2 F (39 C) (Oral)   Resp 18   Ht 6\' 3"  (1.905 m)   Wt 68 kg   SpO2 92%   BMI 18.75 kg/m   Physical Exam Vitals signs and nursing note reviewed.  Constitutional:      General: He is not in acute distress.    Appearance: He is well-developed.     Comments: Anxious adult male awake and alert  HENT:     Head: Normocephalic and atraumatic.  Eyes:     Conjunctiva/sclera: Conjunctivae normal.  Cardiovascular:     Rate and Rhythm: Regular rhythm. Tachycardia present.  Pulmonary:     Effort: Pulmonary effort is normal. No respiratory distress.     Breath sounds: No stridor.  Abdominal:     General: There is no distension.  Skin:    General: Skin is warm and dry.       Neurological:     Mental Status: He is alert and oriented to person, place, and time.  Psychiatric:        Mood and Affect: Mood is anxious.      ED Treatments / Results  Labs (all labs ordered are listed, but only abnormal results are displayed) Labs Reviewed  COMPREHENSIVE METABOLIC PANEL - Abnormal; Notable for the following components:      Result Value   Sodium 129 (*)    Chloride 95 (*)    CO2 20 (*)    Glucose, Bld 108 (*)    BUN 35 (*)    Creatinine, Ser 2.26 (*)    Calcium 8.5 (*)    Albumin 2.9 (*)    AST 51 (*)    GFR calc non Af Amer 34 (*)    GFR calc Af Amer 39 (*)    All other components within normal limits  CBC WITH DIFFERENTIAL/PLATELET - Abnormal; Notable for the following components:   Platelets 95 (*)    All other components within normal limits  BRAIN NATRIURETIC PEPTIDE  DIGOXIN LEVEL    EKG EKG Interpretation  Date/Time:  Monday July 12 2018 18:18:22 EDT Ventricular Rate:  122 PR Interval:    QRS Duration: 145 QT Interval:  286 QTC Calculation: 408 R Axis:   -72 Text Interpretation:  Sinus tachycardia Probable left atrial  enlargement Nonspecific IVCD with LAD Artifact No significant change since last tracing Abnormal ekg Confirmed by  Carmin Muskrat [phone]) on 07/12/2018 6:22:13 PM   Radiology Dg Chest Portable 1 View  Result Date: 07/11/2018 CLINICAL DATA:  Chest pain for several hours EXAM: PORTABLE CHEST 1 VIEW COMPARISON:  05/22/2016 FINDINGS: Cardiac shadow is within normal limits. Postsurgical changes are noted. Defibrillator is again noted and stable. The lungs are well aerated without focal infiltrate or sizable effusion. No acute bony abnormality is noted. IMPRESSION: No active disease. Electronically Signed   By: Inez Catalina M.D.   On: 07/11/2018 20:16    Procedures Procedures (including critical care time)  Medications Ordered in ED Medications  vancomycin (VANCOCIN) IVPB 1000 mg/200 mL premix (1,000 mg Intravenous New Bag/Given 07/12/18 1842)  diphenhydrAMINE (BENADRYL) injection 50 mg (50 mg Intravenous Given 07/12/18 1836)  sodium chloride 0.9 % bolus 1,000 mL (1,000 mLs Intravenous New Bag/Given 07/12/18 1836)  acetaminophen (TYLENOL) tablet 650 mg (650 mg Oral Given 07/12/18 1846)     Initial Impression / Assessment and Plan / ED Course  I have reviewed the triage vital signs and the nursing notes.  Pertinent labs & imaging results that were available during my care of the patient were reviewed by me and considered in my medical decision making (see chart for details).    After I read the patient's chart, is notable that he had positive blood cultures from recent admission, MSSA positive.   Initial labs notable for worsening renal function, hyponatremia. Patient received IV fluids, vancomycin soon after arrival.  This patient who denies IV drug use, but has allegation of this in his chart presents with concern of positive blood cultures.  Patient is awake and alert, but tachycardic, found to have acute kidney injury, with concern for bacteremia, I discussed his case with his internal medicine teaching team, and he was admitted for further antibiotics, monitoring, management.  Final Clinical  Impressions(s) / ED Diagnoses   Final diagnoses:  MSSA bacteremia  AKI (acute kidney injury) ([place])     Carmin Muskrat, MD 07/12/18 1929

## 2018-07-12 NOTE — ED Notes (Signed)
John Davenport is not to know anything per patient request

## 2018-07-13 ENCOUNTER — Encounter (HOSPITAL_COMMUNITY): Payer: Self-pay

## 2018-07-13 ENCOUNTER — Inpatient Hospital Stay (HOSPITAL_COMMUNITY): Payer: Medicare Other

## 2018-07-13 DIAGNOSIS — G4700 Insomnia, unspecified: Secondary | ICD-10-CM

## 2018-07-13 DIAGNOSIS — Z79899 Other long term (current) drug therapy: Secondary | ICD-10-CM

## 2018-07-13 DIAGNOSIS — R7881 Bacteremia: Secondary | ICD-10-CM

## 2018-07-13 DIAGNOSIS — Z95810 Presence of automatic (implantable) cardiac defibrillator: Secondary | ICD-10-CM

## 2018-07-13 DIAGNOSIS — I959 Hypotension, unspecified: Secondary | ICD-10-CM

## 2018-07-13 DIAGNOSIS — F329 Major depressive disorder, single episode, unspecified: Secondary | ICD-10-CM

## 2018-07-13 DIAGNOSIS — Z951 Presence of aortocoronary bypass graft: Secondary | ICD-10-CM

## 2018-07-13 DIAGNOSIS — R451 Restlessness and agitation: Secondary | ICD-10-CM

## 2018-07-13 DIAGNOSIS — R4587 Impulsiveness: Secondary | ICD-10-CM

## 2018-07-13 DIAGNOSIS — F419 Anxiety disorder, unspecified: Secondary | ICD-10-CM

## 2018-07-13 DIAGNOSIS — F1121 Opioid dependence, in remission: Secondary | ICD-10-CM | POA: Diagnosis present

## 2018-07-13 DIAGNOSIS — N179 Acute kidney failure, unspecified: Secondary | ICD-10-CM

## 2018-07-13 DIAGNOSIS — I2510 Atherosclerotic heart disease of native coronary artery without angina pectoris: Secondary | ICD-10-CM

## 2018-07-13 DIAGNOSIS — F1123 Opioid dependence with withdrawal: Secondary | ICD-10-CM

## 2018-07-13 DIAGNOSIS — F17210 Nicotine dependence, cigarettes, uncomplicated: Secondary | ICD-10-CM

## 2018-07-13 DIAGNOSIS — J449 Chronic obstructive pulmonary disease, unspecified: Secondary | ICD-10-CM

## 2018-07-13 DIAGNOSIS — I110 Hypertensive heart disease with heart failure: Secondary | ICD-10-CM

## 2018-07-13 DIAGNOSIS — L509 Urticaria, unspecified: Secondary | ICD-10-CM

## 2018-07-13 DIAGNOSIS — T827XXA Infection and inflammatory reaction due to other cardiac and vascular devices, implants and grafts, initial encounter: Secondary | ICD-10-CM | POA: Diagnosis present

## 2018-07-13 DIAGNOSIS — R454 Irritability and anger: Secondary | ICD-10-CM

## 2018-07-13 DIAGNOSIS — F1190 Opioid use, unspecified, uncomplicated: Secondary | ICD-10-CM

## 2018-07-13 DIAGNOSIS — B9561 Methicillin susceptible Staphylococcus aureus infection as the cause of diseases classified elsewhere: Secondary | ICD-10-CM

## 2018-07-13 DIAGNOSIS — Z8673 Personal history of transient ischemic attack (TIA), and cerebral infarction without residual deficits: Secondary | ICD-10-CM

## 2018-07-13 DIAGNOSIS — I5020 Unspecified systolic (congestive) heart failure: Secondary | ICD-10-CM

## 2018-07-13 DIAGNOSIS — I255 Ischemic cardiomyopathy: Secondary | ICD-10-CM

## 2018-07-13 DIAGNOSIS — I252 Old myocardial infarction: Secondary | ICD-10-CM

## 2018-07-13 DIAGNOSIS — Z7901 Long term (current) use of anticoagulants: Secondary | ICD-10-CM

## 2018-07-13 LAB — BASIC METABOLIC PANEL
Anion gap: 11 (ref 5–15)
BUN: 41 mg/dL — ABNORMAL HIGH (ref 6–20)
CO2: 20 mmol/L — ABNORMAL LOW (ref 22–32)
Calcium: 7.8 mg/dL — ABNORMAL LOW (ref 8.9–10.3)
Chloride: 98 mmol/L (ref 98–111)
Creatinine, Ser: 2.05 mg/dL — ABNORMAL HIGH (ref 0.61–1.24)
GFR calc Af Amer: 44 mL/min — ABNORMAL LOW (ref >60)
GFR calc non Af Amer: 38 mL/min — ABNORMAL LOW (ref >60)
Glucose, Bld: 104 mg/dL — ABNORMAL HIGH (ref 70–99)
Potassium: 4.3 mmol/L (ref 3.5–5.1)
Sodium: 129 mmol/L — ABNORMAL LOW (ref 135–145)

## 2018-07-13 LAB — CBC
HCT: 38.4 % — ABNORMAL LOW (ref 39.0–52.0)
Hemoglobin: 12.7 g/dL — ABNORMAL LOW (ref 13.0–17.0)
MCH: 31.1 pg (ref 26.0–34.0)
MCHC: 33.1 g/dL (ref 30.0–36.0)
MCV: 93.9 fL (ref 80.0–100.0)
Platelets: 73 10*3/uL — ABNORMAL LOW (ref 150–400)
RBC: 4.09 MIL/uL — ABNORMAL LOW (ref 4.22–5.81)
RDW: 14.1 % (ref 11.5–15.5)
WBC: 10.9 10*3/uL — ABNORMAL HIGH (ref 4.0–10.5)
nRBC: 0 % (ref 0.0–0.2)

## 2018-07-13 LAB — ECHOCARDIOGRAM LIMITED
Height: 75 in
Weight: 2740.76 oz

## 2018-07-13 LAB — MRSA PCR SCREENING: MRSA by PCR: NEGATIVE

## 2018-07-13 MED ORDER — ONDANSETRON HCL 4 MG/2ML IJ SOLN
4.0000 mg | Freq: Four times a day (QID) | INTRAMUSCULAR | Status: DC | PRN
  Administered 2018-07-13 – 2018-07-16 (×11): 4 mg via INTRAVENOUS
  Filled 2018-07-13 (×12): qty 2

## 2018-07-13 MED ORDER — ONDANSETRON HCL 4 MG PO TABS
4.0000 mg | ORAL_TABLET | Freq: Four times a day (QID) | ORAL | Status: DC | PRN
  Administered 2018-07-13 – 2018-07-20 (×7): 4 mg via ORAL
  Filled 2018-07-13 (×8): qty 1

## 2018-07-13 MED ORDER — BUPRENORPHINE HCL-NALOXONE HCL 2-0.5 MG SL SUBL
2.0000 | SUBLINGUAL_TABLET | SUBLINGUAL | Status: AC | PRN
  Administered 2018-07-13 – 2018-07-14 (×3): 2 via SUBLINGUAL
  Filled 2018-07-13 (×2): qty 1
  Filled 2018-07-13 (×3): qty 2

## 2018-07-13 MED ORDER — CEFAZOLIN SODIUM-DEXTROSE 2-4 GM/100ML-% IV SOLN
2.0000 g | Freq: Three times a day (TID) | INTRAVENOUS | Status: DC
  Administered 2018-07-13 – 2018-07-20 (×21): 2 g via INTRAVENOUS
  Filled 2018-07-13 (×25): qty 100

## 2018-07-13 MED ORDER — BUPRENORPHINE HCL-NALOXONE HCL 8-2 MG SL SUBL
1.0000 | SUBLINGUAL_TABLET | Freq: Two times a day (BID) | SUBLINGUAL | Status: DC
  Administered 2018-07-13: 19:00:00 1 via SUBLINGUAL
  Filled 2018-07-13: qty 1

## 2018-07-13 MED ORDER — DIGOXIN 125 MCG PO TABS
125.0000 ug | ORAL_TABLET | Freq: Every day | ORAL | Status: DC
  Administered 2018-07-13 – 2018-07-20 (×8): 125 ug via ORAL
  Filled 2018-07-13 (×8): qty 1

## 2018-07-13 MED ORDER — BUPRENORPHINE HCL-NALOXONE HCL 2-0.5 MG SL SUBL
2.0000 | SUBLINGUAL_TABLET | Freq: Once | SUBLINGUAL | Status: AC
  Administered 2018-07-13: 17:00:00 2 via SUBLINGUAL
  Filled 2018-07-13: qty 2

## 2018-07-13 MED ORDER — LOPERAMIDE HCL 2 MG PO CAPS
2.0000 mg | ORAL_CAPSULE | ORAL | Status: DC | PRN
  Administered 2018-07-13: 2 mg via ORAL
  Filled 2018-07-13 (×2): qty 1

## 2018-07-13 MED ORDER — DIPHENHYDRAMINE HCL 50 MG/ML IJ SOLN
50.0000 mg | INTRAMUSCULAR | Status: DC | PRN
  Administered 2018-07-13 – 2018-07-20 (×19): 50 mg via INTRAVENOUS
  Filled 2018-07-13 (×20): qty 1

## 2018-07-13 MED ORDER — RAMELTEON 8 MG PO TABS
8.0000 mg | ORAL_TABLET | Freq: Once | ORAL | Status: AC
  Administered 2018-07-13: 8 mg via ORAL
  Filled 2018-07-13: qty 1

## 2018-07-13 MED ORDER — DIPHENHYDRAMINE HCL 50 MG/ML IJ SOLN
50.0000 mg | Freq: Four times a day (QID) | INTRAMUSCULAR | Status: DC | PRN
  Administered 2018-07-13: 50 mg via INTRAVENOUS
  Filled 2018-07-13: qty 1

## 2018-07-13 MED ORDER — CARVEDILOL 6.25 MG PO TABS
6.2500 mg | ORAL_TABLET | Freq: Two times a day (BID) | ORAL | Status: DC
  Administered 2018-07-13 – 2018-07-15 (×5): 6.25 mg via ORAL
  Filled 2018-07-13 (×6): qty 1

## 2018-07-13 NOTE — Progress Notes (Signed)
Per req of [name] gave pt 2 SL suboxone, 4mg  of zofran. Gave immodium and attempted to give IV benadryl pt state IV hurt.  Attempt was made to place IV. IV team consulted. New IV place and benadryl given.

## 2018-07-13 NOTE — Progress Notes (Signed)
Pharmacy Antibiotic Note  John Davenport is a 43 y.o. male admitted on 07/12/2018 with hives and CP and subsequently found to have MSSA bacteremia after leaving AMA. Pt had been started on nafcillin on readmission but will transition to cefazolin. CrCl ~50 ml/min, ECHO pending.   Plan: -Cefazolin 2g IV q8h -Follow-up cardiology workup, CrCl, repeat cultures  Height: 6\' 3"  (190.5 cm) Weight: 171 lb 4.8 oz (77.7 kg) IBW/kg (Calculated) : 84.5  Temp (24hrs), Avg:99.8 F (37.7 C), Min:98.4 F (36.9 C), Max:102.2 F (39 C)  Recent Labs  Lab 07/11/18 1841 07/12/18 0109 07/12/18 0551 07/12/18 1839 07/13/18 0220  WBC 10.8* 14.8*  --  9.8 10.9*  CREATININE 1.69*  --  1.76* 2.26* 2.05*    Estimated Creatinine Clearance: 50.5 mL/min (A) (by C-G formula based on SCr of 2.05 mg/dL (H)).    No Known Allergies  Antimicrobials this admission: Vancomycin 4/13 x1 Nafcillin 4/13 >> 4/14 Cefazolin 4/14 >>  Dose adjustments this admission: none  Microbiology results: 4/13 MRSA PCR: negative 4/12 BCx: MSSA  Thank you for allowing pharmacy to be a part of this patient's care.   Arrie Senate, PharmD, BCPS Clinical Pharmacist [phone] Please check AMION for all [facility] numbers 07/13/2018

## 2018-07-13 NOTE — Progress Notes (Signed)
Per providers orders check patient belongs for contraband. Security was requested to come to beside. Security recovered a pocket knife from patient who willing gave over. Patient initially refused to allow security to go through belongings but then dumped all his belongings out of the bag onto the bed. No other contraband found. Security took the pocket knife and secured it.

## 2018-07-13 NOTE — Progress Notes (Addendum)
   Subjective: Patient reports nausea, vomiting and diarrhea since this morning. He states he is having severe itching. He states when he got home yesterday he broke out in hives again and started having fevers. He endorses IV heroin use, last used Saturday. He states he usually uses 0.5-1 gram daily. He wants to quit. He has suboxone at home but has never used it. He is amenable to starting this now.   Objective:  Vital signs in last 24 hours: Vitals:   07/12/18 2225 07/12/18 2232 07/13/18 0100 07/13/18 0435  BP: (!) 70/46 (!) 76/46 (!) 74/45 (!) 75/45  Pulse: (!) 129 (!) 114  100  Resp: 17 15 (!) 22 19  Temp: 99.1 F (37.3 C) 99.1 F (37.3 C) (!) 100.4 F (38 C) 98.4 F (36.9 C)  TempSrc: Oral Oral Oral Oral  SpO2: 93% 100% 96% 97%  Weight:  77.6 kg  77.7 kg  Height:  6\' 3"  (1.905 m)     Physical Exam Gen: diaphoretic, no distress CV: tachycardic, regular rhythm, no murmurs  Abdomen: soft, bowel sounds present, no tenderness Skin: diffuse raised urticarial lesion all over body, 2 erythematous, tense nodules on left upper extremity, upper extremity track marks   Assessment/Plan:  Active Problems:   MSSA bacteremia  Mr.Homewood is a 43 yo M w/ PMH of CAD s/p CABG,NSTEMi w/ cocain use, Ischemic cardiomyopathy s/p ICD, HFrEF, COPD, 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 Will require inpatient stay for IV abx and sepsis work-up.  Hypotension, Urticarial rash 2/2 MSSA bacteremia: BC growing MSSA. History of IV heroin use. Received 1 dose of vancomycin in ED, narrowed to nafcillin. Will consult ID, appreciate recommendations. Will also consult EP for ICD removal. - Continue nafcillin   - F/u blood cultures susceptibilities  - Trend CBC - Repeat blood culture after 48 hrs - TTE  Hx of IVDU Opiate withdrawal: Patient endorses using IV heroin daily, 0.5-1 gram. He  states he last used 4/12. He is having withdrawal symptoms such as nausea, vomiting, diarrhea, and diaphoretic. He states he has suboxone at home but has never used it. Amenable to quitting and starting suboxone. COWS score 18. Will start him on suboxone 4 mg, reassess in 1 hour after administering and if he is still having symptoms will give another 4 mg. Then will likely continue with 8mg  qd.  - Suboxone 4 mg  - Zofran prn for nausea - Imodium prn for diarrhea   AKI 2/2 dehydration vs cardio-renal:  Worsening creatinine since 2 days ago. Baseline 1-1.2. 2.26 on readmission, down to 2.05. - Trend renal function - Caution for gentle hydration considering reduced cardiac function  Heart Failure with reduced Ejection Fraction: 20% EF. Home medications include lasix 20mg , digoxin, amiodraone, losartan, carvedilol, spironolactone. Will restart digoxin, amiodarone, and carvedilol.  - Telemetry - I&Os - Daily weights  Hx of CVA 2/2 cardio-embolic source: On Xarelto at home. Dose adjusted for impaired renal function. - C/w rivaroxaban 15mg   Dispo: Anticipated discharge pending clinical improvement.   Shakyra Mattera N, DO 07/13/2018, 7:07 AM Pager: [phone]

## 2018-07-13 NOTE — CV Procedure (Signed)
2D echo attempted, consult was in the room. Will try later.

## 2018-07-13 NOTE — Progress Notes (Signed)
  Date: 07/13/2018  Patient name: John Davenport  Medical record number: [id number]  Date of birth: Sep 03, 1975   I have seen and evaluated this patient and I have discussed the plan of care with the house staff. Please see their note for complete details. I concur with their findings with the following additions/corrections:   Please see my separate attestation of the H&P from 07/12/2018.  Today, we are switching nafcillin to cefazolin per ID recommendations and consulting electrophysiology for ICD removal as it appears infected.  We have also started him on Suboxone for treatment of his opioid use disorder and active withdrawal.  [name], M.D., Ph.D. 07/13/2018, 3:38 PM

## 2018-07-13 NOTE — TOC Initial Note (Signed)
Transition of Care [facility]) - Initial/Assessment Note    Patient Details  Name: John Davenport MRN: [id number] Date of Birth: Jan 27, 1976  Transition of Care [facility]) CM/SW Contact:    Candie Chroman, LCSW Phone Number: 07/13/2018, 9:14 AM  Clinical Narrative:  CSW met with patient, introduced role, and inquired about interest in substance abuse resources. Patient declined resources and stated he had no other needs from the [facility] team. [place] signing off. Consult again if any other CSW/RNCM needs arise.         Expected Discharge Plan: Home/Self Care Barriers to Discharge: Continued Medical Work up   Patient Goals and CMS Choice        Expected Discharge Plan and Services Expected Discharge Plan: Home/Self Care     Post Acute Care Choice: NA Living arrangements for the past 2 months: Single Family Home                 DME Arranged: N/A DME Agency: NA HH Arranged: NA [place] Agency: NA  Prior Living Arrangements/Services Living arrangements for the past 2 months: Single Family Home   Patient language and need for interpreter reviewed:: No Do you feel safe going back to the place where you live?: Yes      Need for Family Participation in Patient Care: No (Comment)     Criminal Activity/Legal Involvement Pertinent to Current Situation/Hospitalization: No - Comment as needed  Activities of Daily Living Home Assistive Devices/Equipment: None ADL Screening (condition at time of admission) Patient's cognitive ability adequate to safely complete daily activities?: Yes Is the patient deaf or have difficulty hearing?: No Does the patient have difficulty seeing, even when wearing glasses/contacts?: No Does the patient have difficulty concentrating, remembering, or making decisions?: No Patient able to express need for assistance with ADLs?: Yes Does the patient have difficulty dressing or bathing?: No Independently performs ADLs?: Yes (appropriate for developmental age) Does the  patient have difficulty walking or climbing stairs?: No Weakness of Legs: None Weakness of Arms/Hands: None  Permission Sought/Granted   Permission granted to share information with : No              Emotional Assessment Appearance:: Appears stated age Attitude/Demeanor/Rapport: Lethargic Affect (typically observed): Appropriate, Calm Orientation: : Oriented to Self, Oriented to Place, Oriented to  Time, Oriented to Situation Alcohol / Substance Use: Illicit Drugs Psych Involvement: No (comment)  Admission diagnosis:  AKI (acute kidney injury) ([place]) [N17.9] MSSA bacteremia [R78.81] Patient Active Problem List   Diagnosis Date Noted  . Anaphylaxis 07/12/2018  . Bacteremia due to methicillin susceptible Staphylococcus aureus (MSSA) 07/12/2018  . MSSA bacteremia 07/12/2018  . Chest pain 07/11/2018  . Encounter for therapeutic drug monitoring 06/09/2016  . Embolic stroke ([place]) 37/12/6267  . Hypotension 05/23/2016  . Smokeless tobacco use 05/23/2016  . Cocaine abuse ([place]) 05/23/2016  . Cerebrovascular accident (CVA) due to thrombosis of left middle cerebral artery (HCC) s/p IV tPA and mechanical thrombectomy   . Chronic systolic congestive heart failure ([place])   . Cough with sputum 04/17/2016  . Intractable upper abdominal pain 03/09/2016  . CKD (chronic kidney disease), stage III ([place]) 03/09/2016  . 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 RUQ abdominal pain 03/09/2016  . NSTEMI (non-ST elevated myocardial infarction) ([place]) 09/05/2015  . Ischemic cardiomyopathy 09/05/2015  . Tobacco abuse 09/05/2015  . Hyperlipidemia 09/05/2015  . Renal insufficiency 09/05/2015  . OSA (obstructive sleep apnea) 12/30/2010  . Cough 12/09/2010  . Dental abscess 12/09/2010  . GERD 04/10/2010  .  PAROXYSMAL VENTRICULAR TACHYCARDIA 03/22/2010  . Chronic combined systolic and diastolic CHF (congestive heart failure) ([place]) 03/22/2010  . Generalized anxiety disorder 05/16/2009  . MYOCARDIAL  INFARCTION 12/19/2008  . CAD in native artery 12/19/2008   PCP:  Patient, No Pcp Per Pharmacy:   [facility], [place]. [place]. [place] [address] Phone: [phone] Fax: [place], [place] [address] [place] [address] Phone: [phone] Fax: [phone]     Social Determinants of Health (SDOH) Interventions    Readmission Risk Interventions No flowsheet data found.

## 2018-07-13 NOTE — Progress Notes (Signed)
Paged provider pt requesting additional dose of suboxone. Patient states "can not sit still... getting worse."

## 2018-07-13 NOTE — Progress Notes (Signed)
Date of Admission:  07/12/2018          Reason for Consult:   Methicillin sensitive Staphylococcus aureus bacteremia  Referring Provider: Dr Rebeca Alert   Assessment:  1. Methicillin sensitive Staphylococcus aureus bacteremia 2. 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 Diffuse urticarial rash possible related to his MSSAB 4. IVDU 5. Hx of CVA 6. Hx of CAD and CABG  Plan:  1. Change to cefazolin if no evidence of CNS involvement for ease of dosing 2. Repeat blood cultures tomorrow 3. Agree with transthoracic echocardiogram 4. Would consult with cardiology and electrophysiology regarding removal of his ICD--they may also want to do a transesophageal echocardiogram prior to extraction 5. He would ideally need a 6-week course of IV antibiotics after device extraction of his device followed by reimplantation preferably after 2-week interval and prove that he is cleared his bacteremia  6. Agree with treating his opiate addiction 7. We will check hep C antibody and hepatitis B antibody and antigen  Active Problems:   MSSA bacteremia   Scheduled Meds: . amiodarone  200 mg Oral BID  . carvedilol  6.25 mg Oral BID  . digoxin  125 mcg Oral Daily  . Rivaroxaban  15 mg Oral Q supper   Continuous Infusions: . nafcillin IV 2 g (07/13/18 0834)   PRN Meds:.acetaminophen **OR** acetaminophen, buprenorphine-naloxone, diphenhydrAMINE, loperamide, ondansetron (ZOFRAN) IV, ondansetron  HPI: John Davenport is a 43 y.o. male the past medical history significant for coronary artery disease status post coronary bypass grafting ischemic cardiomyopathy with ICD, COPD prior embolic stroke, active IV drug use who was admitted to the internal medicine teaching service with hives and chest pain and thought to be suffering from allergic reaction with anaphylaxis.  He was treated with Solu-Medrol epinephrine Benadryl fluids.  He was then discharged but then blood cultures came back for methicillin sensitive  Staphylococcus aureus.  He was contacted by the primary team as well as our infectious his pharmacist and urged to return to the hospital but he refused initially.  Apparently upon return he had worsening urticaria and return of his fevers that bothered him and prompted him to come back to the emergency room.  In the ER he was again febrile up to 102 and tachycardic with low blood pressures hyponatremia and acute renal failure thrombocytopenia,  He has been started on nafcillin.  Transthoracic echocardiogram has been performed though the read was not yet back when I was writing this note.  Patients with an ICD in the presence of Staphylococcus aureus bacteremia have by definition infection of their ICD.  We will need to get in touch with cardiology and electrophysiology to organize further evaluation of his device and the heart valves and organize extraction of his ICD.  Ideally he would then need 6 weeks of IV antibiotics which in his case would need to be in the hospital prior to consideration of reimplanting his ICD.  I am quite concerned with his history of IV drug use that he may not adhere to medical therapy.  Hopefully treating his opiate withdrawal may help with that.  I suspect his rash is due to his staph aureus bacteremia or conceivably some type of allergen potentially a contaminant in his recreational drugs.  I would favor the former though.  Make sure he is checked for hepatitis B and C as well.  Review of Systems: Review of Systems  Constitutional: Positive for fever. Negative for chills, malaise/fatigue and weight loss.  HENT: Negative  for congestion and sore throat.   Eyes: Negative for blurred vision and photophobia.  Respiratory: Negative for cough, shortness of breath and wheezing.   Cardiovascular: Positive for chest pain and palpitations. Negative for leg swelling.  Gastrointestinal: Negative for abdominal pain, blood in stool, constipation, diarrhea, heartburn, melena,  nausea and vomiting.  Genitourinary: Negative for dysuria, flank pain and hematuria.  Musculoskeletal: Negative for back pain, falls, joint pain and myalgias.  Skin: Positive for rash. Negative for itching.  Neurological: Negative for dizziness, focal weakness, loss of consciousness, weakness and headaches.  Endo/Heme/Allergies: Does not bruise/bleed easily.  Psychiatric/Behavioral: Positive for depression and substance abuse. Negative for suicidal ideas. The patient is nervous/anxious and has insomnia.     Past Medical History:  Diagnosis Date  . Anxiety state, unspecified   . Back pain   . Cancer ([place])    Lung CA  . Chronic systolic heart failure ([place])    a. Ischemic CM (EF 30% by echo in 2011) b. echo 08/2015: EF 20% with severe HK and AK of the inferoseptal and apical myocardium.  . Coronary artery disease    a. 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  b. eventually required single vessel CABG (L-LAD) in 03/2009 due to restenosis. c. cath 2013 showed patent LIMA sequential to the diags with LAD filling retrograde, no new disease in RCA and Cx, low LVEDP, sx felt noncardiac. d. 08/2015: NSTEMI: CTO of prox LAD, 99% stenosis of small dCx (no stent), + for cocaine  . Esophageal reflux   . Heart attack ([place])   . Hypercholesteremia   . ICD (implantable cardioverter-defibrillator) infection ([place])    a. [place] Sci ICD 12/2008.   . Ischemic cardiomyopathy    echocardiogram 12/11: Mild LVH, EF 30-35%, anteroseptal and apical akinesis, grade 1 diastolic dysfunction  . LBP (low back pain)   . OSA (obstructive sleep apnea) 12/30/2010  . Paroxysmal ventricular tachycardia ([place])   . Stroke ([place])   . Tobacco abuse     Social History   Tobacco Use  . Smoking status: Current Every Day Smoker    Packs/day: 0.50    Years: 20.00    Pack years: 10.00    Types: Cigarettes  . Smokeless tobacco: Former Systems developer    Types: Chew  . Tobacco comment: smoke 0.5  per week  Substance Use Topics  . Alcohol use: No  . Drug use: Yes    Types: Cocaine    Family History  Problem Relation Age of Onset  . Heart attack Father 13       died at 31 after multiple MI's  . COPD Maternal Grandmother    No Known Allergies  OBJECTIVE: Blood pressure (!) 89/52, pulse 95, temperature 98.6 F (37 C), temperature source Oral, resp. rate (!) 26, height 6\' 3"  (1.905 m), weight 77.7 kg, SpO2 100 %.  Physical Exam Constitutional:      Appearance: He is well-developed. He is ill-appearing.  HENT:     Head: Normocephalic and atraumatic.     Nose: Nose normal.  Eyes:     Extraocular Movements: Extraocular movements intact.     Conjunctiva/sclera: Conjunctivae normal.  Neck:     Musculoskeletal: Normal range of motion and neck supple.  Cardiovascular:     Rate and Rhythm: Regular rhythm. Tachycardia present.     Heart sounds: No murmur. No friction rub. No gallop.   Pulmonary:     Effort: Pulmonary effort is normal.  No respiratory distress.     Breath sounds: Normal breath sounds. No stridor. No wheezing or rhonchi.  Abdominal:     General: Bowel sounds are normal. There is no distension.     Palpations: Abdomen is soft. There is no mass.  Musculoskeletal: Normal range of motion.        General: No tenderness.  Skin:    General: Skin is warm and dry.     Coloration: Skin is pale.     Findings: Rash present. No erythema.  Neurological:     Mental Status: He is alert and oriented to person, place, and time.  Psychiatric:        Mood and Affect: Mood is anxious. Affect is angry.        Speech: Speech is rapid and pressured.        Behavior: Behavior is agitated.        Judgment: Judgment is impulsive.   ICD pocket not overtly tender or red  Diffuse urticarial  Rash, track marks tattoos No clear hemorrhages Osler's nodes or Janeway lesions.  Lab Results Lab Results  Component Value Date   WBC 10.9 (H) 07/13/2018   HGB 12.7 (L) 07/13/2018   HCT  38.4 (L) 07/13/2018   MCV 93.9 07/13/2018   PLT 73 (L) 07/13/2018    Lab Results  Component Value Date   CREATININE 2.05 (H) 07/13/2018   BUN 41 (H) 07/13/2018   NA 129 (L) 07/13/2018   K 4.3 07/13/2018   CL 98 07/13/2018   CO2 20 (L) 07/13/2018    Lab Results  Component Value Date   ALT 40 07/12/2018   AST 51 (H) 07/12/2018   ALKPHOS 50 07/12/2018   BILITOT 1.0 07/12/2018     Microbiology: Recent Results (from the past 240 hour(s))  Culture, blood (routine x 2)     Status: Abnormal (Preliminary result)   Collection Time: 07/11/18 10:45 PM  Result Value Ref Range Status   Specimen Description BLOOD LEFT HAND  Final   Special Requests   Final    BOTTLES DRAWN AEROBIC AND ANAEROBIC Blood Culture results may not be optimal due to an inadequate volume of blood received in culture bottles   Culture  Setup Time   Final    GRAM POSITIVE COCCI IN CLUSTERS IN BOTH AEROBIC AND ANAEROBIC BOTTLES CRITICAL RESULT CALLED TO, READ BACK BY AND VERIFIED WITH: RN WOFFORD, R 1303 [id number] FCP    Culture (A)  Final    STAPHYLOCOCCUS AUREUS SUSCEPTIBILITIES TO FOLLOW Performed at [facility], [place] [address], [place], [place] [address]    Report Status PENDING  Incomplete  Blood Culture ID Panel (Reflexed)     Status: Abnormal   Collection Time: 07/11/18 10:45 PM  Result Value Ref Range Status   Enterococcus species NOT DETECTED NOT DETECTED Final   Listeria monocytogenes NOT DETECTED NOT DETECTED Final   Staphylococcus species DETECTED (A) NOT DETECTED Final    Comment: CRITICAL RESULT CALLED TO, READ BACK BY AND VERIFIED WITH: RN WOFFORD, R 1303 [id number] FCP    Staphylococcus aureus (BCID) DETECTED (A) NOT DETECTED Final    Comment: Methicillin (oxacillin) susceptible Staphylococcus aureus (MSSA). Preferred therapy is anti staphylococcal beta lactam antibiotic (Cefazolin or Nafcillin), unless clinically contraindicated. CRITICAL RESULT CALLED TO, READ BACK BY AND VERIFIED WITH: RN  WOFFORD, R 1303 [id number] FCP    Methicillin resistance NOT DETECTED NOT DETECTED Final   Streptococcus species NOT DETECTED NOT DETECTED Final   Streptococcus agalactiae  NOT DETECTED NOT DETECTED Final   Streptococcus pneumoniae NOT DETECTED NOT DETECTED Final   Streptococcus pyogenes NOT DETECTED NOT DETECTED Final   Acinetobacter baumannii NOT DETECTED NOT DETECTED Final   Enterobacteriaceae species NOT DETECTED NOT DETECTED Final   Enterobacter cloacae complex NOT DETECTED NOT DETECTED Final   Escherichia coli NOT DETECTED NOT DETECTED Final   Klebsiella oxytoca NOT DETECTED NOT DETECTED Final   Klebsiella pneumoniae NOT DETECTED NOT DETECTED Final   Proteus species NOT DETECTED NOT DETECTED Final   Serratia marcescens NOT DETECTED NOT DETECTED Final   Haemophilus influenzae NOT DETECTED NOT DETECTED Final   Neisseria meningitidis NOT DETECTED NOT DETECTED Final   Pseudomonas aeruginosa NOT DETECTED NOT DETECTED Final   Candida albicans NOT DETECTED NOT DETECTED Final   Candida glabrata NOT DETECTED NOT DETECTED Final   Candida krusei NOT DETECTED NOT DETECTED Final   Candida parapsilosis NOT DETECTED NOT DETECTED Final   Candida tropicalis NOT DETECTED NOT DETECTED Final    Comment: Performed at [facility], [place] [address], [place], [place] [address]  Culture, blood (routine x 2)     Status: Abnormal (Preliminary result)   Collection Time: 07/11/18 10:49 PM  Result Value Ref Range Status   Specimen Description BLOOD LEFT ARM  Final   Special Requests   Final    BOTTLES DRAWN AEROBIC AND ANAEROBIC Blood Culture results may not be optimal due to an inadequate volume of blood received in culture bottles   Culture  Setup Time   Final    GRAM POSITIVE COCCI IN CLUSTERS IN BOTH AEROBIC AND ANAEROBIC BOTTLES CRITICAL VALUE NOTED.  VALUE IS CONSISTENT WITH PREVIOUSLY REPORTED AND CALLED VALUE.    Culture (A)  Final    STAPHYLOCOCCUS AUREUS SUSCEPTIBILITIES TO FOLLOW Performed  at [facility], [place] [address] [place], [place] [address]    Report Status PENDING  Incomplete  MRSA PCR Screening     Status: None   Collection Time: 07/12/18 10:48 PM  Result Value Ref Range Status   MRSA by PCR NEGATIVE NEGATIVE Final    Comment:        The GeneXpert MRSA Assay (FDA approved for NASAL specimens only), is one component of a comprehensive MRSA colonization surveillance program. It is not intended to diagnose MRSA infection nor to guide or monitor treatment for MRSA infections. Performed at [facility], [place] [address], [place], [place] [address]     Alcide Evener, [place] [facility] [phone] pager  07/13/2018, 12:16 PM

## 2018-07-13 NOTE — Progress Notes (Signed)
  Echocardiogram 2D Echocardiogram has been performed.  John Davenport 07/13/2018, 12:06 PM

## 2018-07-13 NOTE — Progress Notes (Signed)
Paged MD@[phone]  about pt being nauseous.  Page returned @0950  provider advise of patient complaint of nausea. Orders being place for Zofran by provider. RN will continue to monitor.

## 2018-07-14 DIAGNOSIS — D696 Thrombocytopenia, unspecified: Secondary | ICD-10-CM | POA: Diagnosis present

## 2018-07-14 DIAGNOSIS — Z720 Tobacco use: Secondary | ICD-10-CM

## 2018-07-14 LAB — CULTURE, BLOOD (ROUTINE X 2)

## 2018-07-14 LAB — CBC
HCT: 41.2 % (ref 39.0–52.0)
Hemoglobin: 14 g/dL (ref 13.0–17.0)
MCH: 31 pg (ref 26.0–34.0)
MCHC: 34 g/dL (ref 30.0–36.0)
MCV: 91.2 fL (ref 80.0–100.0)
Platelets: 64 10*3/uL — ABNORMAL LOW (ref 150–400)
RBC: 4.52 MIL/uL (ref 4.22–5.81)
RDW: 13.9 % (ref 11.5–15.5)
WBC: 15.8 10*3/uL — ABNORMAL HIGH (ref 4.0–10.5)
nRBC: 0 % (ref 0.0–0.2)

## 2018-07-14 LAB — BASIC METABOLIC PANEL
Anion gap: 11 (ref 5–15)
BUN: 27 mg/dL — ABNORMAL HIGH (ref 6–20)
CO2: 22 mmol/L (ref 22–32)
Calcium: 8.4 mg/dL — ABNORMAL LOW (ref 8.9–10.3)
Chloride: 95 mmol/L — ABNORMAL LOW (ref 98–111)
Creatinine, Ser: 1.16 mg/dL (ref 0.61–1.24)
GFR calc Af Amer: >60 mL/min (ref >60)
GFR calc non Af Amer: >60 mL/min (ref >60)
Glucose, Bld: 114 mg/dL — ABNORMAL HIGH (ref 70–99)
Potassium: 3.6 mmol/L (ref 3.5–5.1)
Sodium: 128 mmol/L — ABNORMAL LOW (ref 135–145)

## 2018-07-14 LAB — PREPARE RBC (CROSSMATCH)

## 2018-07-14 LAB — SEDIMENTATION RATE: Sed Rate: 40 mm/hr — ABNORMAL HIGH (ref 0–16)

## 2018-07-14 LAB — C-REACTIVE PROTEIN: CRP: 15.4 mg/dL — ABNORMAL HIGH (ref <1.0)

## 2018-07-14 MED ORDER — RIVAROXABAN 20 MG PO TABS: 20.0000 mg | ORAL_TABLET | Freq: Every day | ORAL | Status: DC

## 2018-07-14 MED ORDER — LOSARTAN POTASSIUM 25 MG PO TABS: 25.0000 mg | ORAL_TABLET | Freq: Two times a day (BID) | ORAL | Status: DC

## 2018-07-14 MED ORDER — ENOXAPARIN SODIUM 40 MG/0.4ML ~~LOC~~ SOLN: 40.0000 mg | Freq: Once | SUBCUTANEOUS | Status: DC

## 2018-07-14 MED ORDER — BUPRENORPHINE HCL-NALOXONE HCL 8-2 MG SL SUBL: 1.0000 | SUBLINGUAL_TABLET | Freq: Three times a day (TID) | SUBLINGUAL | Status: DC

## 2018-07-14 MED ORDER — CALCIUM CARBONATE ANTACID 500 MG PO CHEW: 2.0000 | CHEWABLE_TABLET | Freq: Three times a day (TID) | ORAL | Status: DC | PRN

## 2018-07-14 MED ORDER — SPIRONOLACTONE 25 MG PO TABS
25.0000 mg | ORAL_TABLET | Freq: Every day | ORAL | Status: DC
  Administered 2018-07-14 – 2018-07-15 (×2): 25 mg via ORAL
  Filled 2018-07-14 (×2): qty 1

## 2018-07-14 MED ORDER — LOSARTAN POTASSIUM 25 MG PO TABS
25.0000 mg | ORAL_TABLET | Freq: Every day | ORAL | Status: DC
  Administered 2018-07-14 – 2018-07-20 (×6): 25 mg via ORAL
  Filled 2018-07-14 (×6): qty 1

## 2018-07-14 MED ORDER — BUPRENORPHINE HCL-NALOXONE HCL 8-2 MG SL SUBL
1.0000 | SUBLINGUAL_TABLET | Freq: Three times a day (TID) | SUBLINGUAL | Status: DC
  Administered 2018-07-14 – 2018-07-20 (×19): 1 via SUBLINGUAL
  Filled 2018-07-14 (×19): qty 1

## 2018-07-14 NOTE — Consult Note (Signed)
Cardiology Consultation:   Patient ID: John Davenport MRN: [id number]; DOB: 1976/01/08  Admit date: 07/12/2018 Date of Consult: 07/14/2018  Primary Care Provider: Patient, No Pcp Per Primary Cardiologist: Dr. Aundra Dubin Primary Electrophysiologist:  Dr. Rayann Heman (last 2017), previously Dr. Leonia Reeves   Patient Profile:   John Davenport is a 43 y.o. male with a hx of CAD (PCI to LAD 2009, PTCA in-stent re-stenosis 2010, CABG 2011, stable CAD by cath in Nov 2019), ICM, chronic CHF (systolic), ICD, COPD, stroke (2018 suspect cardioembolic (suspected as possible etiology LV thrombus given severe LV dysfunction, none noted by TTE), started on warfarin), poly substance abuse, noncompliance  who is being seen today for the evaluation of MSSA bacteremia/presumed device infection at the request of Dr. Rebeca Alert.   Device information BSCi dual chamber ICD, implanted 01/02/09 RA [id number] Dextrus lead [id number] [employer]iance (dual coil) lead    History of Present Illness:   John Davenport was initially seen in the ER 07/11/2018 admitted for an anaphylactic reaction to an unknown trigger (pt denied drug use, though girlfriend spoke with provider and reported he was using IV drugs and cocaine), suspected perhaps reaction 2/2 to an injected substance, he was treated and responded well to epinephrine and IVF, and discharged 07/12/2018.  During that brief admission [facility] were drawn and when came back positive (MSSA) the patient was called to return to the hospital, he initially declined, though again developed hives and came back to the hospital, he was admitted to [facility] 07/12/2018.  NOTED on H&P, the patient reports using heroin daily, started on Suboxone, not felt to be acutely fluid OL, noting as well AKI, his home xarelto dose adjusted accordingly, and hypotension 2 allergic reaction, home meds held.  ID is on board, recs: He would ideally need a 6-week course of IV antibiotics after device extraction of his device  followed by reimplantation preferably after 2-week interval and prove that he is cleared his bacteremia   TTE was ordered, deferring decision of TEE to cardiology/EP  EP is asked to the case given ICD in place w/MSSA baceremia. TTE completed yesterday noted  Severe global reduction in LV systolic function; severe LVE; mild diastolic dysfunction; oscillating densities on pacer wire concerning for vegetation (largest 1.2 x 0.6 cm).  LABS K+ 3.6 BUN/Creat 23/1.76 > 35/2.26 > 41/2.05 >> 27/1.16 WBC 9.8 > 10.9 >> 15.8 H/H 14/41 Plts 147 > 107 > 95 > 73 > 64 Dig <0.2 TSH 1.353  Last device transmission 04/20/2018 AP 0%, VP 0% Since Oct 2019 events NSVT/VT episodes, available EGMs reviewed, longest 12beats DEVICE totals 3 treated VF, one treated VT (unknown when)  Past Medical History:  Diagnosis Date  . Anxiety state, unspecified   . Back pain   . Cancer ([place])    Lung CA  . Chronic systolic heart failure ([place])    a. Ischemic CM (EF 30% by echo in 2011) b. echo 08/2015: EF 20% with severe HK and AK of the inferoseptal and apical myocardium.  . Coronary artery disease    a. 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  b. eventually required single vessel CABG (L-LAD) in 03/2009 due to restenosis. c. cath 2013 showed patent LIMA sequential to the diags with LAD filling retrograde, no new disease in RCA and Cx, low LVEDP, sx felt noncardiac. d. 08/2015: NSTEMI: CTO of prox LAD, 99% stenosis of small dCx (no stent), + for cocaine  . Esophageal reflux   .  Heart attack ([place])   . Hypercholesteremia   . ICD (implantable cardioverter-defibrillator) infection ([place])    a. [place] [employer] ICD 12/2008.   . Ischemic cardiomyopathy    echocardiogram 12/11: Mild LVH, EF 30-35%, anteroseptal and apical akinesis, grade 1 diastolic dysfunction  . LBP (low back pain)   . OSA (obstructive sleep apnea) 12/30/2010  . Paroxysmal ventricular tachycardia ([place])   . Stroke ([place])    . Tobacco abuse     Past Surgical History:  Procedure Laterality Date  . CARDIAC CATHETERIZATION N/A 09/06/2015   Procedure: Left Heart Cath and Cors/Grafts Angiography;  Surgeon: Sherren Mocha, MD;  Location: [place] CV LAB;  Service: Cardiovascular;  Laterality: N/A;  . CARDIAC DEFIBRILLATOR PLACEMENT    . CARDIAC DEFIBRILLATOR PLACEMENT     2010 by JA  . CORONARY ANGIOPLASTY WITH STENT PLACEMENT     LAD stenting 10/09 and re-opening 6/10  . CORONARY ARTERY BYPASS GRAFT     1/11  . IR GENERIC HISTORICAL  05/21/2016   IR PERCUTANEOUS ART THROMBECTOMY/INFUSION INTRACRANIAL INC DIAG ANGIO 05/21/2016 Luanne Bras, MD [facility]  . IR GENERIC HISTORICAL  06/16/2016   IR RADIOLOGIST EVAL & MGMT 06/16/2016 [facility]  . LEFT HEART CATHETERIZATION WITH CORONARY ANGIOGRAM N/A 08/07/2011   Procedure: LEFT HEART CATHETERIZATION WITH CORONARY ANGIOGRAM;  Surgeon: Josue Hector, MD;  Location: [facility] CATH [facility];  Service: Cardiovascular;  Laterality: N/A;  . RADIOLOGY WITH ANESTHESIA N/A 05/21/2016   Procedure: RADIOLOGY WITH ANESTHESIA;  Surgeon: Luanne Bras, MD;  Location: [place];  Service: Radiology;  Laterality: N/A;  . RIGHT/LEFT HEART CATH AND CORONARY ANGIOGRAPHY N/A 02/03/2018   Procedure: RIGHT/LEFT HEART CATH AND CORONARY ANGIOGRAPHY;  Surgeon: Larey Dresser, MD;  Location: [place] CV [facility];  Service: Cardiovascular;  Laterality: N/A;     Home Medications:  Prior to Admission medications   Medication Sig Start Date End Date Taking? Authorizing Provider  amiodarone (PACERONE) 200 MG tablet Take 1 tablet (200 mg total) by mouth 2 (two) times daily. 02/17/18   Shirley Friar, PA-C  carvedilol (COREG) 6.25 MG tablet Take 1 tablet (6.25 mg total) by mouth 2 (two) times daily. 01/27/18 04/27/18  Shirley Friar, PA-C  digoxin (LANOXIN) 0.125 MG tablet Take 1 tablet (125 mcg total) by mouth daily. 02/03/18 02/03/19  Larey Dresser, MD  EPINEPHrine 0.3 mg/0.3 mL  IJ SOAJ injection Inject 0.3 mLs (0.3 mg total) into the muscle as needed for anaphylaxis. 07/12/18   Rehman, Areeg N, DO  furosemide (LASIX) 40 MG tablet Take 1 tablet (40 mg total) by mouth 2 (two) times daily. 02/03/18 05/04/18  Larey Dresser, MD  losartan (COZAAR) 25 MG tablet Take 1 tablet (25 mg total) by mouth 2 (two) times daily. 02/18/18 05/19/18  Shirley Friar, PA-C  nitroGLYCERIN (NITROSTAT) 0.4 MG SL tablet Place 1 tablet (0.4 mg total) under the tongue every 5 (five) minutes as needed for chest pain. Patient not taking: Reported on 02/17/2018 09/24/15   Lyda Jester M, PA-C  potassium chloride SA (K-DUR,KLOR-CON) 20 MEQ tablet Take 1 tablet (20 mEq total) by mouth daily. 01/27/18 04/27/18  Shirley Friar, PA-C  rivaroxaban (XARELTO) 15 MG TABS tablet Take 1 tablet (15 mg total) by mouth daily with supper for 30 days. Restart on Thursday morning. 07/12/18 08/11/18  Rehman, Areeg N, DO  spironolactone (ALDACTONE) 25 MG tablet Take 1 tablet (25 mg total) by mouth at bedtime. 01/27/18   Shirley Friar, PA-C  Inpatient Medications: Scheduled Meds: . amiodarone  200 mg Oral BID  . buprenorphine-naloxone  1 tablet Sublingual Q8H  . carvedilol  6.25 mg Oral BID  . digoxin  125 mcg Oral Daily  . losartan  25 mg Oral Daily  . Rivaroxaban  20 mg Oral Q supper  . spironolactone  25 mg Oral QHS   Continuous Infusions: .  ceFAZolin (ANCEF) IV 2 g (07/14/18 0550)   PRN Meds: acetaminophen **OR** acetaminophen, buprenorphine-naloxone, diphenhydrAMINE, loperamide, ondansetron (ZOFRAN) IV, ondansetron  Allergies:   No Known Allergies  Social History:   Social History   Socioeconomic History  . Marital status: Single    Spouse name: Not on file  . Number of children: 1  . Years of education: Not on file  . Highest education level: Not on file  Occupational History  . Occupation: disabled    Fish farm manager: UNEMPLOYED  Social Needs  . Financial resource  strain: Not hard at all  . Food insecurity:    Worry: Never true    Inability: Never true  . Transportation needs:    Medical: No    Non-medical: No  Tobacco Use  . Smoking status: Current Every Day Smoker    Packs/day: 0.50    Years: 20.00    Pack years: 10.00    Types: Cigarettes  . Smokeless tobacco: Former Systems developer    Types: Chew  . Tobacco comment: smoke 0.5 per week  Substance and Sexual Activity  . Alcohol use: No  . Drug use: Yes    Types: Cocaine  . Sexual activity: Not on file  Lifestyle  . Physical activity:    Days per week: Not on file    Minutes per session: Not on file  . Stress: Not on file  Relationships  . Social connections:    Talks on phone: Not on file    Gets together: Not on file    Attends religious service: Not on file    Active member of club or organization: Not on file    Attends meetings of clubs or organizations: Not on file    Relationship status: Not on file  . Intimate partner violence:    Fear of current or ex partner: Not on file    Emotionally abused: Not on file    Physically abused: Not on file    Forced sexual activity: Not on file  Other Topics Concern  . Not on file  Social History Narrative   Pt lives with his mom, brother, and 16 yo son in [place].   Quit tobacco in Jan. 2011 prior to heart surgery (prior -1/2 PPD (down from 1 ppd per day, onset age 37)    alcohol abuse- prior, hasn't drank since age 16   no illicit drug use    Currently unemployed, awaiting disability   Single          Family History:   Family History  Problem Relation Age of Onset  . Heart attack Father 40       died at 41 after multiple MI's  . COPD Maternal Grandmother      ROS:  Please see the history of present illness.  All other ROS reviewed and negative.     Physical Exam/Data:   Vitals:   07/13/18 2100 07/13/18 2300 07/14/18 0300 07/14/18 0714  BP:  117/79 119/87 118/71  Pulse:  85 70 82  Resp: 18 17 (!) 23 (!) 25  Temp:   97.9 F (36.6 C) (!)  97.4 F (36.3 C) (!) 97.5 F (36.4 C)  TempSrc:  Oral Oral Oral  SpO2:  98% 98% 99%  Weight:   76.7 kg   Height:        Intake/Output Summary (Last 24 hours) at 07/14/2018 0842 Last data filed at 07/14/2018 0715 Gross per 24 hour  Intake 2093.75 ml  Output -  Net 2093.75 ml   Last 3 Weights 07/14/2018 07/13/2018 07/12/2018  Weight (lbs) 169 lb 1.5 oz 171 lb 4.8 oz 171 lb 1.2 oz  Weight (kg) 76.7 kg 77.7 kg 77.6 kg     Body mass index is 21.14 kg/m.     General:  Well nourished, well developed, chronically ill-appearing in no acute distress HEENT: normal Lymph: no adenopathy Neck: no JVD Endocrine:  No thryomegaly Vascular: No carotid bruits Cardiac:  RRR;  Lungs:   No increased work of breathing Abd: soft, nontender  Ext: no edema Musculoskeletal:  No obvious deformities Skin: warm and dry,  LUE/forearm, injection sites noted/erythematous, multiple tattoos Neuro:  No gross focal abnormalities noted Psych:  Normal affect   EKG:  The EKG was personally reviewed and demonstrates:   ST, 122bpm, IVCD, LAD Telemetry:  Telemetry was personally reviewed and demonstrates:   SR 70's, infrequent PVcs, rare A paced beat (following PVC)  Relevant CV Studies:  07/13/2018: TTE IMPRESSIONS  1. The left ventricle has severely reduced systolic function, with an ejection fraction of 20-25%. The cavity size was severely dilated. Left ventricular diastolic Doppler parameters are consistent with impaired relaxation. Left ventricular diffuse  hypokinesis.  2. The right ventricle has normal systolc function. The cavity was normal.  3. Mild thickening of the mitral valve leaflet.  4. The tricuspid valve was grossly normal.  5. The aortic valve is tricuspid Mild thickening of the aortic valve. No stenosis of the aortic valve.  6. Pulmonic valve regurgitation was not assessed by color flow Doppler.  7. Severe global reduction in LV systolic function; severe LVE; mild  diastolic dysfunction; oscillating densities on pacer wire concerning for vegetation (largest 1.2 x 0.6 cm).  FINDINGS  Left Ventricle: The left ventricle has severely reduced systolic function, with an ejection fraction of 20-25%. The cavity size was severely dilated. Left ventricular diastolic Doppler parameters are consistent with impaired relaxation (grade I). Left  ventricular diffuse hypokinesis.   Right Ventricle: The right ventricle has normal systolic function. The cavity was normal. Pacing wire/catheter visualized in the right ventricle. Left Atrium: Left atrial size was normal in size. Right Atrium: Right atrial size was normal in size. Right atrial pressure is estimated at 10 mmHg. Interatrial Septum: No atrial level shunt detected by color flow Doppler. Pericardium: There is no evidence of pericardial effusion. Mitral Valve: The mitral valve is normal in structure. Mild thickening of the mitral valve leaflet. Mitral valve regurgitation is trivial by color flow Doppler. Tricuspid Valve: The tricuspid valve was grossly normal. Tricuspid valve regurgitation is mild by color flow Doppler. Aortic Valve: The aortic valve is tricuspid Mild thickening of the aortic valve. Aortic valve regurgitation was not visualized by color flow Doppler. There is No stenosis of the aortic valve. Pulmonic Valve: Pulmonic valve regurgitation was not assessed by color flow Doppler. Venous: The inferior vena cava is normal in size with greater than 50% respiratory variability.     02/03/18: R/LHC 1. Low cardiac output.  2. Elevated left heart filling pressure (PCWP and LVEDP).  3. Patent RCA and sequential LIMA-D1/D2.  The LCx system disease actually does  not look as significant as on the cath 2 years ago.    No interventional target, suspect volume overload and marginal cardiac output are the causes of his symptoms.  I will add digoxin 0.125 mg daily and will increase Lasix to 40 mg bid.    Laboratory  Data:  Chemistry Recent Labs  Lab 07/12/18 1839 07/13/18 0220 07/14/18 0301  NA 129* 129* 128*  K 4.0 4.3 3.6  CL 95* 98 95*  CO2 20* 20* 22  GLUCOSE 108* 104* 114*  BUN 35* 41* 27*  CREATININE 2.26* 2.05* 1.16  CALCIUM 8.5* 7.8* 8.4*  GFRNONAA 34* 38* >60  GFRAA 39* 44* >60  ANIONGAP 14 11 11     Recent Labs  Lab 07/11/18 1912 07/12/18 1839  PROT 7.1 7.3  ALBUMIN 2.9* 2.9*  AST 42* 51*  ALT 36 40  ALKPHOS 65 50  BILITOT 1.2 1.0   Hematology Recent Labs  Lab 07/12/18 0109 07/12/18 1839 07/13/18 0220  WBC 14.8* 9.8 10.9*  RBC 4.22 4.48 4.09*  HGB 13.1 13.8 12.7*  HCT 40.8 43.0 38.4*  MCV 96.7 96.0 93.9  MCH 31.0 30.8 31.1  MCHC 32.1 32.1 33.1  RDW 14.6 14.2 14.1  PLT 107* 95* 73*   Cardiac Enzymes Recent Labs  Lab 07/11/18 1912 07/12/18 0111 07/12/18 0551  TROPONINI <0.03 <0.03 <0.03   No results for input(s): TROPIPOC in the last 168 hours.  BNP Recent Labs  Lab 07/12/18 0109 07/12/18 1839  BNP 876.0* 413.1*    DDimer No results for input(s): DDIMER in the last 168 hours.  Radiology/Studies:   Dg Chest Port 1 View Result Date: 07/12/2018 CLINICAL DATA:  Fevers EXAM: PORTABLE CHEST 1 VIEW COMPARISON:  07/11/2018 FINDINGS: Cardiac shadow is stable. The lungs are well aerated bilaterally. No focal infiltrate or sizable effusion is seen. Postsurgical changes are again noted as well as a defibrillator. No bony abnormality is seen. IMPRESSION: No acute abnormality noted.  No change from the previous day. Electronically Signed   By: Inez Catalina M.D.   On: 07/12/2018 19:25    Assessment and Plan:   1. MSSA bacteremia 2. Endocarditis     oscillating densities on pacer wire concerning for vegetation (largest 1.2 x 0.6 cm). By TTE  Concerning issues for extraction/re-implant: 1. Current IV drug (poly substance) abuser (likely source of infection, and concerning when considering re-implant 2. downward trend of platelets (management deferred to IM/ID  services 3. Xarelto, last dose was last night 4. Ongoing systemic allergic reaction     Hives 5. active opiate withdrawal symptoms     Getting suboxone  no TEE will need him off xarelto, stop today, plan single dose of 40mg  lovenox tomorrow AM to reduce thromboembolic risk, none afterwards  last device transmission notes + h/o therapies in device lifetime (unclear if this was device testing, or true therapies.   Will need formal device interrogation and possible need to discuss lifevest  For questions or updates, please contact [place] Please consult [email or web address] for contact info under     Signed, Baldwin Jamaica, PA-C  07/14/2018 8:42 AM  EP attending  Patient seen and examined.  Agree with the findings as noted above.  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  He is not device dependent.  I discussed the risks, benefits, goals, and expectations of extraction and he is willing to proceed.  We can consider that he wear a LifeVest  if he is discharged home.  His multiple comorbidities including ongoing intravenous drug abuse make him not a current candidate for another ICD.  Transesophageal echo will not be required preoperatively although I would anticipate placing a transesophageal echo during device extraction in this patient.  We will plan to proceed with extraction as soon as the schedule allows.  Crissie Sickles, MD

## 2018-07-14 NOTE — Progress Notes (Addendum)
   Subjective: Patient reports he is in pain this morning, mostly in his feet and legs. He is continuing to have nausea, vomiting and diarrhea. He states he felt better last night after he got 8 mg of suboxone. He states he typically uses 3 grams of heroin daily and needs more suboxone. He states he feels restless. He did not sleep overnight. He continues to have itching intermittently but the benadryl helps.   Objective:  Vital signs in last 24 hours: Vitals:   07/13/18 2100 07/13/18 2300 07/14/18 0300 07/14/18 0714  BP:  117/79 119/87 118/71  Pulse:  85 70 82  Resp: 18 17 (!) 23 (!) 25  Temp:  97.9 F (36.6 C) (!) 97.4 F (36.3 C) (!) 97.5 F (36.4 C)  TempSrc:  Oral Oral Oral  SpO2:  98% 98% 99%  Weight:   76.7 kg   Height:       Physical Exam Gen: seen uncomfortably laying in bed, no distress CV: RRR, no murmurs, rubs or gallops Pulm: CTA bilaterally, normal effort Abdomen: soft, non tender, bowel sounds present Ext: no edema, warm and well perfused, UE track marks    Assessment/Plan:  Principal Problem:   Bacteremia due to methicillin susceptible Staphylococcus aureus (MSSA) Active Problems:   PAROXYSMAL VENTRICULAR TACHYCARDIA   Tobacco abuse   CKD (chronic kidney disease), stage III (H[facility])   Chronic systolic congestive heart failure (HCC)   ICD (implantable cardioverter-defibrillator) infection ([facility])   Opioid use disorder ([place])  John Davenport is a 43 yo M w/ PMH of CAD s/p CABG,NSTEMi w/ cocain use, Ischemic cardiomyopathy s/p ICD, HFrEF, COPD, 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 Will require inpatient stay for IV abx and sepsis work-up.  MSSA bacteremia: History of IV heroin use. Switched to cefazolin yesterday. TTE showed oscillating densities on pacer wire, concerning for vegetations. Per ID, he will need 6 week course of IV antibiotics after ICD  removal. Ideally, ICD reimplantation after 2 week interval after 6 weeks of IV abx therapy to prove he is cleared of bacteremia.  EP has been consulted for ICD removal.  - ID on board, appreciate recommendations - Continue cefazolin -F/ublood cultures susceptibilities  - Repeat blood culture after 48 hrs  Opiate use disorder: Patient states he uses ~3 grams of heroin daily. He states he last used 4/12. He received 4 mg x3 of suboxone yesterday afternoon and another 8 mg last night.  He continues to have withdrawal symptoms such as nausea, vomiting, diarrhea and restlessness. Will increase his dose to 8 mg Q8H - Suboxone 8 mg Q8H - Zofran prn for nausea - Imodium prn for diarrhea   AKI: Cr improved to 1.16, resolved.   Heart Failure with reduced Ejection Fraction: 20% EF. Euvolemic. - Restart losartan, spironolactone, will hold furosemide for now  - Continue digoxin, amiodarone, and carvedilol  - Telemetry - I&Os - Daily weights  Hx of CVA 2/2 cardio-embolic source:  - C/w [id number] mg  Dispo: Anticipated discharge pending clinical improvement. Patient will need 6 weeks of IV antibiotic therapy following ICD removal. He is high risk due to IVDU and would not be an ideal candidate for home IV therapy with a picc line.   Mike Craze, DO 07/14/2018, 8:18 AM Pager: [phone]

## 2018-07-14 NOTE — Progress Notes (Signed)
  Date: 07/14/2018  Patient name: John Davenport  Medical record number: [id number]  Date of birth: 1976-01-21   I have seen and evaluated this patient and I have discussed the plan of care with the house staff. Please see their note for complete details. I concur with their findings with the following additions/corrections:   43 year old man with complex cardiac history including CABG, ischemic cardiomyopathy with HFrEF, prior V. tach with ICD, embolic CVA on rivaroxaban, as well as COPD and IV drug use admitted for MSSA bacteremia.  Also having opioid withdrawal and hives of unclear etiology.  1.  MSSA bacteremia: Appreciate ID consult, continuing IV cefazolin, afebrile for 24 hours now.  ICD wire appears infected and he will need this device removed.  Appreciate EP seeing him today, planning for device removal.  We will need to hold rivaroxaban, per EP plan to give enoxaparin 40 mg tomorrow then hold anticoagulation going forward.  Unfortunately, we will need to keep him inpatient to complete his course of IV antibiotics given his history of IV drug use.  2.  Opioid use disorder with opiate withdrawal: Started on Suboxone, symptoms have improved.  Still having nausea and vomiting as well as generalized restlessness and bony pain.  He now admits to taking 3 g of heroin a day and previously took Suboxone he had brought on the streets 24 mg at a time, so not surprising he is requiring higher doses than we initially expected.  Increase to 2 8 mg 3 times daily today, hopefully this will manage his withdrawal, if not we will need to switch to methadone to allow for higher doses.  3.  Urticaria: Resolved, unclear etiology, possibly related to allergic reaction to something he ingested or injected.  However, also possible this was related to his sepsis.  No further treatment needed at this time and no concern with proceeding with device removal  4.  Acute kidney injury: Resolved, likely related to  sepsis, creatinine down to 1.2 today, baseline appears to be approximately 1  5.  HFrEF from ischemic cardiomyopathy: Still appears euvolemic, back on home medicines  6.  Thrombocytopenia: Likely related to his sepsis, we will check a smear to look for schistocytes, but he has no other current manifestations of a MAHA.  [name], M.D., Ph.D. 07/14/2018, 1:28 PM

## 2018-07-14 NOTE — Progress Notes (Signed)
Patient stated that he vomited 5 times today. Patient did not show emesis to to RN, he c/o headache an got Tylenol, he stated that his headache is gone at this time.John Davenport He got Zofran and Benadryl but he still does not feel better with his stomach. At this time. Will notify MD.

## 2018-07-14 NOTE — Plan of Care (Signed)
  Problem: Education: Goal: Knowledge of General Education information will improve Description Including pain rating scale, medication(s)/side effects and non-pharmacologic comfort measures Outcome: Progressing   Problem: Clinical Measurements: Goal: Ability to maintain clinical measurements within normal limits will improve Outcome: Progressing Goal: Diagnostic test results will improve Outcome: Progressing Goal: Respiratory complications will improve Outcome: Progressing Goal: Cardiovascular complication will be avoided Outcome: Progressing   Problem: Elimination: Goal: Will not experience complications related to urinary retention Outcome: Progressing   Problem: Safety: Goal: Ability to remain free from injury will improve Outcome: Progressing   Problem: Skin Integrity: Goal: Risk for impaired skin integrity will decrease Outcome: Progressing

## 2018-07-14 NOTE — Progress Notes (Signed)
Subjective:  States that that he hurts all over.   Antibiotics:  Anti-infectives (From admission, onward)   Start     Dose/Rate Route Frequency Ordered Stop   07/13/18 1300  ceFAZolin (ANCEF) IVPB 2g/100 mL premix     2 g 200 mL/hr over 30 Minutes Intravenous Every 8 hours 07/13/18 1253     07/13/18 0000  nafcillin injection 2 g  Status:  Discontinued     2 g Intravenous Every 4 hours 07/12/18 2059 07/12/18 2125   07/13/18 0000  nafcillin 2 g in sodium chloride 0.9 % 100 mL IVPB  Status:  Discontinued     2 g 200 mL/hr over 30 Minutes Intravenous Every 4 hours 07/12/18 2125 07/13/18 1231   07/12/18 1830  vancomycin (VANCOCIN) IVPB 1000 mg/200 mL premix     1,000 mg 200 mL/hr over 60 Minutes Intravenous  Once 07/12/18 1820 07/12/18 1942      Medications: Scheduled Meds: . amiodarone  200 mg Oral BID  . buprenorphine-naloxone  1 tablet Sublingual Q8H  . carvedilol  6.25 mg Oral BID  . digoxin  125 mcg Oral Daily  . losartan  25 mg Oral Daily  . Rivaroxaban  20 mg Oral Q supper  . spironolactone  25 mg Oral QHS   Continuous Infusions: .  ceFAZolin (ANCEF) IV 2 g (07/14/18 0550)   PRN Meds:.acetaminophen **OR** acetaminophen, buprenorphine-naloxone, diphenhydrAMINE, loperamide, ondansetron (ZOFRAN) IV, ondansetron    Objective: Weight change: 8.66 kg  Intake/Output Summary (Last 24 hours) at 07/14/2018 1028 Last data filed at 07/14/2018 0909 Gross per 24 hour  Intake 2213.75 ml  Output -  Net 2213.75 ml   Blood pressure 113/75, pulse 78, temperature 98.2 F (36.8 C), temperature source Oral, resp. rate 18, height 6\' 3"  (1.905 m), weight 76.7 kg, SpO2 99 %. Temp:  [97.4 F (36.3 C)-98.2 F (36.8 C)] 98.2 F (36.8 C) (04/15 1001) Pulse Rate:  [68-99] 78 (04/15 1001) Resp:  [17-25] 18 (04/15 1001) BP: (104-119)/(71-87) 113/75 (04/15 1001) SpO2:  [94 %-99 %] 99 % (04/15 0714) Weight:  [76.7 kg] 76.7 kg (04/15 0300)  Physical Exam: General: Alert and  awake, oriented x3, less agitated than yesterday. HEENT: anicteric sclera, EOMI CVS regular rate, normal  Chest: , no wheezing, no respiratory distress Abdomen: soft non-distended,  Extremities: no edema or deformity noted bilaterally Skin: His urticarial rash seems slightly improved  Neuro: nonfocal  CBC:    BMET Recent Labs    07/13/18 0220 07/14/18 0301  NA 129* 128*  K 4.3 3.6  CL 98 95*  CO2 20* 22  GLUCOSE 104* 114*  BUN 41* 27*  CREATININE 2.05* 1.16  CALCIUM 7.8* 8.4*     Liver Panel  Recent Labs    07/11/18 1912 07/12/18 1839  PROT 7.1 7.3  ALBUMIN 2.9* 2.9*  AST 42* 51*  ALT 36 40  ALKPHOS 65 50  BILITOT 1.2 1.0  BILIDIR 0.4*  --   IBILI 0.8  --        Sedimentation Rate Recent Labs    07/14/18 0301  ESRSEDRATE 40*   C-Reactive Protein Recent Labs    07/14/18 0301  CRP 15.4*    Micro Results: Recent Results (from the past 720 hour(s))  Culture, blood (routine x 2)     Status: Abnormal   Collection Time: 07/11/18 10:45 PM  Result Value Ref Range Status   Specimen Description BLOOD LEFT HAND  Final   Special  Requests   Final    BOTTLES DRAWN AEROBIC AND ANAEROBIC Blood Culture results may not be optimal due to an inadequate volume of blood received in culture bottles   Culture  Setup Time   Final    GRAM POSITIVE COCCI IN CLUSTERS IN BOTH AEROBIC AND ANAEROBIC BOTTLES CRITICAL RESULT CALLED TO, READ BACK BY AND VERIFIED WITH: RN [place], [place] 1303 [id number] FCP Performed at [facility], [place] [address] [place], [place] [address]    Culture STAPHYLOCOCCUS AUREUS (A)  Final   Report Status 07/14/2018 FINAL  Final   Organism ID, Bacteria STAPHYLOCOCCUS AUREUS  Final      Susceptibility   Staphylococcus aureus - MIC*    CIPROFLOXACIN <=0.5 SENSITIVE Sensitive     ERYTHROMYCIN <=0.25 SENSITIVE Sensitive     GENTAMICIN <=0.5 SENSITIVE Sensitive     OXACILLIN 0.5 SENSITIVE Sensitive     TETRACYCLINE <=1 SENSITIVE Sensitive      VANCOMYCIN <=0.5 SENSITIVE Sensitive     TRIMETH/SULFA <=10 SENSITIVE Sensitive     CLINDAMYCIN <=0.25 SENSITIVE Sensitive     RIFAMPIN <=0.5 SENSITIVE Sensitive     Inducible Clindamycin NEGATIVE Sensitive     * STAPHYLOCOCCUS AUREUS  Blood Culture ID Panel (Reflexed)     Status: Abnormal   Collection Time: 07/11/18 10:45 PM  Result Value Ref Range Status   Enterococcus species NOT DETECTED NOT DETECTED Final   Listeria monocytogenes NOT DETECTED NOT DETECTED Final   Staphylococcus species DETECTED (A) NOT DETECTED Final    Comment: CRITICAL RESULT CALLED TO, READ BACK BY AND VERIFIED WITH: RN WOFFORD, R 1303 [id number] FCP    Staphylococcus aureus (BCID) DETECTED (A) NOT DETECTED Final    Comment: Methicillin (oxacillin) susceptible Staphylococcus aureus (MSSA). Preferred therapy is anti staphylococcal beta lactam antibiotic (Cefazolin or Nafcillin), unless clinically contraindicated. CRITICAL RESULT CALLED TO, READ BACK BY AND VERIFIED WITH: RN WOFFORD, R 1303 [id number] FCP    Methicillin resistance NOT DETECTED NOT DETECTED Final   Streptococcus species NOT DETECTED NOT DETECTED Final   Streptococcus agalactiae NOT DETECTED NOT DETECTED Final   Streptococcus pneumoniae NOT DETECTED NOT DETECTED Final   Streptococcus pyogenes NOT DETECTED NOT DETECTED Final   Acinetobacter baumannii NOT DETECTED NOT DETECTED Final   Enterobacteriaceae species NOT DETECTED NOT DETECTED Final   Enterobacter cloacae complex NOT DETECTED NOT DETECTED Final   Escherichia coli NOT DETECTED NOT DETECTED Final   Klebsiella oxytoca NOT DETECTED NOT DETECTED Final   Klebsiella pneumoniae NOT DETECTED NOT DETECTED Final   Proteus species NOT DETECTED NOT DETECTED Final   Serratia marcescens NOT DETECTED NOT DETECTED Final   Haemophilus influenzae NOT DETECTED NOT DETECTED Final   Neisseria meningitidis NOT DETECTED NOT DETECTED Final   Pseudomonas aeruginosa NOT DETECTED NOT DETECTED Final   Candida albicans NOT  DETECTED NOT DETECTED Final   Candida glabrata NOT DETECTED NOT DETECTED Final   Candida krusei NOT DETECTED NOT DETECTED Final   Candida parapsilosis NOT DETECTED NOT DETECTED Final   Candida tropicalis NOT DETECTED NOT DETECTED Final    Comment: Performed at [facility], [place] [address], [place], [place] [address]  Culture, blood (routine x 2)     Status: Abnormal   Collection Time: 07/11/18 10:49 PM  Result Value Ref Range Status   Specimen Description BLOOD LEFT ARM  Final   Special Requests   Final    BOTTLES DRAWN AEROBIC AND ANAEROBIC Blood Culture results may not be optimal due to an inadequate volume  of blood received in culture bottles   Culture  Setup Time   Final    GRAM POSITIVE COCCI IN CLUSTERS IN BOTH AEROBIC AND ANAEROBIC BOTTLES CRITICAL VALUE NOTED.  VALUE IS CONSISTENT WITH PREVIOUSLY REPORTED AND CALLED VALUE.    Culture (A)  Final    STAPHYLOCOCCUS AUREUS SUSCEPTIBILITIES PERFORMED ON PREVIOUS CULTURE WITHIN THE LAST 5 DAYS. Performed at [facility], [place] [address] [place], [place] [address]    Report Status 07/14/2018 FINAL  Final  MRSA PCR Screening     Status: None   Collection Time: 07/12/18 10:48 PM  Result Value Ref Range Status   MRSA by PCR NEGATIVE NEGATIVE Final    Comment:        The GeneXpert MRSA Assay (FDA approved for NASAL specimens only), is one component of a comprehensive MRSA colonization surveillance program. It is not intended to diagnose MRSA infection nor to guide or monitor treatment for MRSA infections. Performed at [facility], [place] [address] [place],  [address]     Studies/Results: Dg Chest Port 1 View  Result Date: 07/12/2018 CLINICAL DATA:  Fevers EXAM: PORTABLE CHEST 1 VIEW COMPARISON:  07/11/2018 FINDINGS: Cardiac shadow is stable. The lungs are well aerated bilaterally. No focal infiltrate or sizable effusion is seen. Postsurgical changes are again noted as well as a defibrillator. No bony  abnormality is seen. IMPRESSION: No acute abnormality noted.  No change from the previous day. Electronically Signed   By: Inez Catalina M.D.   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 male with   past medical history significant for coronary artery disease status post coronary bypass grafting ischemic cardiomyopathy with ICD, COPD prior embolic stroke, active IV drug use who was admitted to the internal medicine teaching service with hives and chest pain and thought to be suffering from allergic reaction with anaphylaxis.  He was found to have methicillin sensitive Staphylococcus aureus in his blood cultures.  He initially refused to be admission but then was readmitted after he had worsening of his hives and fever.  He has been found on 2D echocardiogram to have multiple vegetations on his ICD.   #1  MSSA bacteremia with ICD infection:  We will need to have his device explanted  We will then need 6 weeks of IV antibiotics ideally, which will need to take place in the hospital.  I have not yet seen note from electrophysiology or cardiology.  Noted in many cases they have typically wanted to get transesophageal echocardiogram though I am not sure that this is absolutely indicated in this case I would defer to them.  There is no question the device has to come out though  My preference also would be to not reimplant the device until we have sterile blood cultures greater than 2 weeks from completing antimicrobial therapy.   Would also consider the risk of his continued IV drug use would pose to continuing with an ICD in the risk-benefit calculation and the ethical dilemma that this poses.  2.  IV drug use: Is getting Suboxone now which seems to be calming him down.  Otitis C and B antibodies are pending  #3 rash curious urticarial rash may be it is due to his severe staph infection.  Dr Prince Rome will take over the service tomorrow.    LOS: 2 days   Alcide Evener 07/14/2018, 10:28 AM

## 2018-07-15 DIAGNOSIS — E878 Other disorders of electrolyte and fluid balance, not elsewhere classified: Secondary | ICD-10-CM

## 2018-07-15 DIAGNOSIS — Z8619 Personal history of other infectious and parasitic diseases: Secondary | ICD-10-CM

## 2018-07-15 DIAGNOSIS — E871 Hypo-osmolality and hyponatremia: Secondary | ICD-10-CM | POA: Diagnosis present

## 2018-07-15 DIAGNOSIS — D696 Thrombocytopenia, unspecified: Secondary | ICD-10-CM

## 2018-07-15 DIAGNOSIS — Z955 Presence of coronary angioplasty implant and graft: Secondary | ICD-10-CM

## 2018-07-15 DIAGNOSIS — A4101 Sepsis due to Methicillin susceptible Staphylococcus aureus: Secondary | ICD-10-CM

## 2018-07-15 DIAGNOSIS — B182 Chronic viral hepatitis C: Secondary | ICD-10-CM

## 2018-07-15 DIAGNOSIS — F1990 Other psychoactive substance use, unspecified, uncomplicated: Secondary | ICD-10-CM

## 2018-07-15 DIAGNOSIS — I330 Acute and subacute infective endocarditis: Secondary | ICD-10-CM

## 2018-07-15 HISTORY — DX: Personal history of other infectious and parasitic diseases: Z86.19

## 2018-07-15 LAB — BASIC METABOLIC PANEL
Anion gap: 12 (ref 5–15)
BUN: 20 mg/dL (ref 6–20)
CO2: 22 mmol/L (ref 22–32)
Calcium: 8.2 mg/dL — ABNORMAL LOW (ref 8.9–10.3)
Chloride: 93 mmol/L — ABNORMAL LOW (ref 98–111)
Creatinine, Ser: 0.86 mg/dL (ref 0.61–1.24)
GFR calc Af Amer: >60 mL/min (ref >60)
GFR calc non Af Amer: >60 mL/min (ref >60)
Glucose, Bld: 101 mg/dL — ABNORMAL HIGH (ref 70–99)
Potassium: 3.7 mmol/L (ref 3.5–5.1)
Sodium: 127 mmol/L — ABNORMAL LOW (ref 135–145)

## 2018-07-15 LAB — CBC
HCT: 42.3 % (ref 39.0–52.0)
Hemoglobin: 14.8 g/dL (ref 13.0–17.0)
MCH: 31.6 pg (ref 26.0–34.0)
MCHC: 35 g/dL (ref 30.0–36.0)
MCV: 90.2 fL (ref 80.0–100.0)
Platelets: 75 10*3/uL — ABNORMAL LOW (ref 150–400)
RBC: 4.69 MIL/uL (ref 4.22–5.81)
RDW: 13.8 % (ref 11.5–15.5)
WBC: 17.4 10*3/uL — ABNORMAL HIGH (ref 4.0–10.5)
nRBC: 0 % (ref 0.0–0.2)

## 2018-07-15 LAB — OSMOLALITY, URINE: Osmolality, Ur: 667 mOsm/kg (ref 300–900)

## 2018-07-15 LAB — HEPATITIS C ANTIBODY (REFLEX): HCV Ab: >11 s/co ratio — ABNORMAL HIGH (ref 0.0–0.9)

## 2018-07-15 LAB — OSMOLALITY: Osmolality: 270 mOsm/kg — ABNORMAL LOW (ref 275–295)

## 2018-07-15 LAB — SURGICAL PCR SCREEN
MRSA, PCR: NEGATIVE
Staphylococcus aureus: NEGATIVE

## 2018-07-15 LAB — SODIUM, URINE, RANDOM: Sodium, Ur: 37 mmol/L

## 2018-07-15 LAB — HEPATITIS B SURFACE ANTIBODY, QUANTITATIVE: Hep B S AB Quant (Post): <3.1 m[IU]/mL — ABNORMAL LOW (ref >9.9)

## 2018-07-15 LAB — COMMENT2 - HEP PANEL

## 2018-07-15 LAB — HEPATITIS B SURFACE ANTIGEN: Hepatitis B Surface Ag: NEGATIVE

## 2018-07-15 MED ORDER — CEFAZOLIN SODIUM-DEXTROSE 2-4 GM/100ML-% IV SOLN
2.0000 g | INTRAVENOUS | Status: DC
  Filled 2018-07-15 (×2): qty 100

## 2018-07-15 MED ORDER — SODIUM CHLORIDE 0.9 % IV SOLN
INTRAVENOUS | Status: DC
  Administered 2018-07-15 – 2018-07-16 (×3): via INTRAVENOUS

## 2018-07-15 MED ORDER — CHLORHEXIDINE GLUCONATE 4 % EX LIQD
60.0000 mL | Freq: Once | CUTANEOUS | Status: DC
  Filled 2018-07-15: qty 60

## 2018-07-15 MED ORDER — SODIUM CHLORIDE 0.9 % IV SOLN
INTRAVENOUS | Status: DC
  Administered 2018-07-16: 06:00:00 via INTRAVENOUS

## 2018-07-15 MED ORDER — CHLORHEXIDINE GLUCONATE 4 % EX LIQD
60.0000 mL | Freq: Once | CUTANEOUS | Status: AC
  Administered 2018-07-16: 4 via TOPICAL

## 2018-07-15 MED ORDER — SODIUM CHLORIDE 0.9 % IV SOLN
80.0000 mg | INTRAVENOUS | Status: AC
  Administered 2018-07-16: 14:00:00 80 mg
  Filled 2018-07-15: qty 2
  Filled 2018-07-15: qty 80

## 2018-07-15 NOTE — Progress Notes (Signed)
Subjective:  Some ambulation from bed to toilet but OTW has remained in bed all day today; c/o diffuse MSK pain but does report it is more manageable since methadone was started..the patient already indicating he won't remain hospitalized for 6 weeks of tx, claiming he has to care for his 45 y/o twin girls (who are currently staying with his aunt in [place]; his mother lives in [place] per his report)   Antibiotics:  Anti-infectives (From admission, onward)   Start     Dose/Rate Route Frequency Ordered Stop   07/16/18 1145  gentamicin (GARAMYCIN) 80 mg in sodium chloride 0.9 % 500 mL irrigation     80 mg Irrigation To [facility] 07/15/18 0934 07/17/18 1145   07/16/18 1030  ceFAZolin (ANCEF) IVPB 2g/100 mL premix     2 g 200 mL/hr over 30 Minutes Intravenous To [facility]Stay [facility] 07/15/18 0934 07/17/18 1030   07/13/18 1300  ceFAZolin (ANCEF) IVPB 2g/100 mL premix     2 g 200 mL/hr over 30 Minutes Intravenous Every 8 hours 07/13/18 1253     07/13/18 0000  nafcillin injection 2 g  Status:  Discontinued     2 g Intravenous Every 4 hours 07/12/18 2059 07/12/18 2125   07/13/18 0000  nafcillin 2 g in sodium chloride 0.9 % 100 mL IVPB  Status:  Discontinued     2 g 200 mL/hr over 30 Minutes Intravenous Every 4 hours 07/12/18 2125 07/13/18 1231   07/12/18 1830  vancomycin (VANCOCIN) IVPB 1000 mg/200 mL premix     1,000 mg 200 mL/hr over 60 Minutes Intravenous  Once 07/12/18 1820 07/12/18 1942      Medications: Scheduled Meds: . amiodarone  200 mg Oral BID  . buprenorphine-naloxone  1 tablet Sublingual Q8H  . carvedilol  6.25 mg Oral BID  . digoxin  125 mcg Oral Daily  . [START ON 07/16/2018] gentamicin irrigation  80 mg Irrigation To SS-Surg  . losartan  25 mg Oral Daily  . spironolactone  25 mg Oral QHS   Continuous Infusions: . sodium chloride 150 mL/hr at 07/15/18 0934  .  ceFAZolin (ANCEF) IV 2 g (07/15/18 0555)  . [START ON 07/16/2018]  ceFAZolin (ANCEF) IV     PRN Meds:.acetaminophen **OR** acetaminophen, calcium carbonate, diphenhydrAMINE, loperamide, ondansetron (ZOFRAN) IV, ondansetron    Objective: Weight change: 0.003 kg  Intake/Output Summary (Last 24 hours) at 07/15/2018 1138 Last data filed at 07/15/2018 1100 Gross per 24 hour  Intake 1500 ml  Output 280 ml  Net 1220 ml   Blood pressure 103/72, pulse 71, temperature 98 F (36.7 C), temperature source Oral, resp. rate 20, height 6\' 3"  (1.905 m), weight 76.7 kg, SpO2 98 %. Temp:  [97.7 F (36.5 C)-98.4 F (36.9 C)] 98 F (36.7 C) (04/16 1004) Pulse Rate:  [71-85] 71 (04/16 1004) Resp:  [14-27] 20 (04/16 1004) BP: (95-116)/(62-82) 103/72 (04/16 1004) SpO2:  [96 %-98 %] 98 % (04/16 0800) Weight:  [76.7 kg] 76.7 kg (04/16 0617)  Physical Exam: General: Alert and awake, oriented x3, less agitated but still anxious/irritiable at times. HEENT: anicteric sclera, EOMI CVS regular rate, normal, I/VI SEM at LLSB, ICD intact to LT chest wall w/o erythema but slight tenderness  Chest: mild wheezing throughout, CTA bilaterally, no respiratory distress Abdomen: soft non-distended, +BS (hypoactive) Extremities: trace LE edema or deformity noted bilaterally Skin: His urticarial rash is improved/nearly resolved; abrasions to RT chest wall are healing, no [place] lesions or splinter  hemorrhages Neuro: nonfocal, gait was not assessed, CN II-XII grossly intact  CBC:    BMET Recent Labs    07/14/18 0301 07/15/18 0218  NA 128* 127*  K 3.6 3.7  CL 95* 93*  CO2 22 22  GLUCOSE 114* 101*  BUN 27* 20  CREATININE 1.16 0.86  CALCIUM 8.4* 8.2*     Liver Panel  Recent Labs    07/12/18 1839  PROT 7.3  ALBUMIN 2.9*  AST 51*  ALT 40  ALKPHOS 50  BILITOT 1.0       Sedimentation Rate Recent Labs    07/14/18 0301  ESRSEDRATE 40*   C-Reactive Protein Recent Labs    07/14/18 0301  CRP 15.4*    Micro Results: Recent Results (from the past 720 hour(s))  Culture, blood  (routine x 2)     Status: Abnormal   Collection Time: 07/11/18 10:45 PM  Result Value Ref Range Status   Specimen Description BLOOD LEFT HAND  Final   Special Requests   Final    BOTTLES DRAWN AEROBIC AND ANAEROBIC Blood Culture results may not be optimal due to an inadequate volume of blood received in culture bottles   Culture  Setup Time   Final    GRAM POSITIVE COCCI IN CLUSTERS IN BOTH AEROBIC AND ANAEROBIC BOTTLES CRITICAL RESULT CALLED TO, READ BACK BY AND VERIFIED WITH: RN [place], [place] 1303 [id number] FCP Performed at [facility], [place] [address], [place], [place] [address]    Culture STAPHYLOCOCCUS AUREUS (A)  Final   Report Status 07/14/2018 FINAL  Final   Organism ID, Bacteria STAPHYLOCOCCUS AUREUS  Final      Susceptibility   Staphylococcus aureus - MIC*    CIPROFLOXACIN <=0.5 SENSITIVE Sensitive     ERYTHROMYCIN <=0.25 SENSITIVE Sensitive     GENTAMICIN <=0.5 SENSITIVE Sensitive     OXACILLIN 0.5 SENSITIVE Sensitive     TETRACYCLINE <=1 SENSITIVE Sensitive     VANCOMYCIN <=0.5 SENSITIVE Sensitive     TRIMETH/SULFA <=10 SENSITIVE Sensitive     CLINDAMYCIN <=0.25 SENSITIVE Sensitive     RIFAMPIN <=0.5 SENSITIVE Sensitive     Inducible Clindamycin NEGATIVE Sensitive     * STAPHYLOCOCCUS AUREUS  Blood Culture ID Panel (Reflexed)     Status: Abnormal   Collection Time: 07/11/18 10:45 PM  Result Value Ref Range Status   Enterococcus species NOT DETECTED NOT DETECTED Final   Listeria monocytogenes NOT DETECTED NOT DETECTED Final   Staphylococcus species DETECTED (A) NOT DETECTED Final    Comment: CRITICAL RESULT CALLED TO, READ BACK BY AND VERIFIED WITH: RN WOFFORD, R 1303 [id number] FCP    Staphylococcus aureus (BCID) DETECTED (A) NOT DETECTED Final    Comment: Methicillin (oxacillin) susceptible Staphylococcus aureus (MSSA). Preferred therapy is anti staphylococcal beta lactam antibiotic (Cefazolin or Nafcillin), unless clinically contraindicated. CRITICAL RESULT CALLED TO,  READ BACK BY AND VERIFIED WITH: RN WOFFORD, R 1303 [id number] FCP    Methicillin resistance NOT DETECTED NOT DETECTED Final   Streptococcus species NOT DETECTED NOT DETECTED Final   Streptococcus agalactiae NOT DETECTED NOT DETECTED Final   Streptococcus pneumoniae NOT DETECTED NOT DETECTED Final   Streptococcus pyogenes NOT DETECTED NOT DETECTED Final   Acinetobacter baumannii NOT DETECTED NOT DETECTED Final   Enterobacteriaceae species NOT DETECTED NOT DETECTED Final   Enterobacter cloacae complex NOT DETECTED NOT DETECTED Final   Escherichia coli NOT DETECTED NOT DETECTED Final   Klebsiella oxytoca NOT DETECTED NOT DETECTED Final   Klebsiella pneumoniae NOT DETECTED  NOT DETECTED Final   Proteus species NOT DETECTED NOT DETECTED Final   Serratia marcescens NOT DETECTED NOT DETECTED Final   Haemophilus influenzae NOT DETECTED NOT DETECTED Final   Neisseria meningitidis NOT DETECTED NOT DETECTED Final   Pseudomonas aeruginosa NOT DETECTED NOT DETECTED Final   Candida albicans NOT DETECTED NOT DETECTED Final   Candida glabrata NOT DETECTED NOT DETECTED Final   Candida krusei NOT DETECTED NOT DETECTED Final   Candida parapsilosis NOT DETECTED NOT DETECTED Final   Candida tropicalis NOT DETECTED NOT DETECTED Final    Comment: Performed at [facility], [place] [address], [place], [place] [address]  Culture, blood (routine x 2)     Status: Abnormal   Collection Time: 07/11/18 10:49 PM  Result Value Ref Range Status   Specimen Description BLOOD LEFT ARM  Final   Special Requests   Final    BOTTLES DRAWN AEROBIC AND ANAEROBIC Blood Culture results may not be optimal due to an inadequate volume of blood received in culture bottles   Culture  Setup Time   Final    GRAM POSITIVE COCCI IN CLUSTERS IN BOTH AEROBIC AND ANAEROBIC BOTTLES CRITICAL VALUE NOTED.  VALUE IS CONSISTENT WITH PREVIOUSLY REPORTED AND CALLED VALUE.    Culture (A)  Final    STAPHYLOCOCCUS AUREUS SUSCEPTIBILITIES  PERFORMED ON PREVIOUS CULTURE WITHIN THE LAST 5 DAYS. Performed at [facility], [place] [address] [place], [place] [address]    Report Status 07/14/2018 FINAL  Final  MRSA PCR Screening     Status: None   Collection Time: 07/12/18 10:48 PM  Result Value Ref Range Status   MRSA by PCR NEGATIVE NEGATIVE Final    Comment:        The GeneXpert MRSA Assay (FDA approved for NASAL specimens only), is one component of a comprehensive MRSA colonization surveillance program. It is not intended to diagnose MRSA infection nor to guide or monitor treatment for MRSA infections. Performed at [facility], [place] [address], [place], [place] [address]     Studies/Results: No results found.    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 male with   past medical history significant for coronary artery disease status post coronary bypass grafting, ischemic cardiomyopathy with ICD, COPD, prior embolic stroke, active IV drug use who was admitted with fever, hives, and chest pain subsequently found to have MSSA sepsis/endocarditis and multiple vegetations on his ICD.   1.  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 Appreciate Dr. Lovena Le (EP) evaluating the patient and working towards device extraction (tentatively planned for  tomorrow). TEE to be deferred per EP. Due to his co-morbidties, implantation of a new device will be avoided as EP does NOT feel the patient is device dependent. His continued IV drug use poses a significant risk if a device were to be re-implanted as the rates  of re-infection would remain elevated significantly. While rifampin would typically be added until his device is extracted, he remains on xarelto for his ischemic CM serving as a contra-indication for concurrent rifampin tx. Continue cefazolin 2 gm IV q 8 hrs for now. Repeat blood cxs x 2 following device explantation tomorrow.  2.  IV drug use: He is getting Suboxone now which seems to be calming him down.  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 Pt informed of dx. He denies sharing needles but admits to injecting 3 gm of heroin/day for the past 1 year in total. He has never received methadone as an OP and has never attended any support group or structured rehab/dependency counselling.  3. Rash - curious urticarial rash may be it is due to his severe staph infection. Unlikely to be due to his current cefazolin as the rash was present prior to initiation of ABX.    LOS: 3 days   Janine Ores 07/15/2018, 11:38 AM

## 2018-07-15 NOTE — Progress Notes (Signed)
Progress Note  Patient Name: John Davenport Date of Encounter: 07/15/2018  Primary Cardiologist: Dr. Aundra Dubin EP: Dr. Rayann Heman  Subjective   No chest pain. "last night was the first night I have slept in 4 days"  Inpatient Medications    Scheduled Meds: . amiodarone  200 mg Oral BID  . buprenorphine-naloxone  1 tablet Sublingual Q8H  . carvedilol  6.25 mg Oral BID  . digoxin  125 mcg Oral Daily  . losartan  25 mg Oral Daily  . spironolactone  25 mg Oral QHS   Continuous Infusions: .  ceFAZolin (ANCEF) IV 2 g (07/15/18 0555)   PRN Meds: acetaminophen **OR** acetaminophen, calcium carbonate, diphenhydrAMINE, loperamide, ondansetron (ZOFRAN) IV, ondansetron   Vital Signs    Vitals:   07/14/18 2300 07/15/18 0332 07/15/18 0400 07/15/18 0617  BP: 110/77 100/68    Pulse: 78 79    Resp: 17 (!) 22 (!) 24   Temp: 97.9 F (36.6 C) 98.4 F (36.9 C)    TempSrc: Oral Oral    SpO2: 98% 97%    Weight:    76.7 kg  Height:        Intake/Output Summary (Last 24 hours) at 07/15/2018 0731 Last data filed at 07/14/2018 2313 Gross per 24 hour  Intake 1080 ml  Output 280 ml  Net 800 ml   Last 3 Weights 07/15/2018 07/14/2018 07/13/2018  Weight (lbs) 169 lb 1.6 oz 169 lb 1.5 oz 171 lb 4.8 oz  Weight (kg) 76.703 kg 76.7 kg 77.7 kg      Telemetry    SR, rare PVC, some followed by AP - Personally Reviewed  ECG    No new EKGs - Personally Reviewed  Physical Exam   GEN: No acute distress.   Neck: No JVD Cardiac:  RRR, no murmurs, rubs, or gallops.  Respiratory: no increased work of breathing GI:  non-distended  MS: No edema; No deformity. Neuro:  Nonfocal  Psych: Normal affect   Labs    Chemistry Recent Labs  Lab 07/11/18 1912  07/12/18 1839 07/13/18 0220 07/14/18 0301 07/15/18 0218  NA  --    < > 129* 129* 128* 127*  K  --    < > 4.0 4.3 3.6 3.7  CL  --    < > 95* 98 95* 93*  CO2  --    < > 20* 20* 22 22  GLUCOSE  --    < > 108* 104* 114* 101*  BUN  --    <  > 35* 41* 27* 20  CREATININE  --    < > 2.26* 2.05* 1.16 0.86  CALCIUM  --    < > 8.5* 7.8* 8.4* 8.2*  PROT 7.1  --  7.3  --   --   --   ALBUMIN 2.9*  --  2.9*  --   --   --   AST 42*  --  51*  --   --   --   ALT 36  --  40  --   --   --   ALKPHOS 65  --  50  --   --   --   BILITOT 1.2  --  1.0  --   --   --   GFRNONAA  --    < > 34* 38* >60 >60  GFRAA  --    < > 39* 44* >60 >60  ANIONGAP  --    < > 14 11 11 12    < > =  values in this interval not displayed.     Hematology Recent Labs  Lab 07/13/18 0220 07/14/18 0301 07/15/18 0218  WBC 10.9* 15.8* 17.4*  RBC 4.09* 4.52 4.69  HGB 12.7* 14.0 14.8  HCT 38.4* 41.2 42.3  MCV 93.9 91.2 90.2  MCH 31.1 31.0 31.6  MCHC 33.1 34.0 35.0  RDW 14.1 13.9 13.8  PLT 73* 64* 75*    Cardiac Enzymes Recent Labs  Lab 07/11/18 1912 07/12/18 0111 07/12/18 0551  TROPONINI <0.03 <0.03 <0.03   No results for input(s): TROPIPOC in the last 168 hours.   BNP Recent Labs  Lab 07/12/18 0109 07/12/18 1839  BNP 876.0* 413.1*     DDimer No results for input(s): DDIMER in the last 168 hours.   Radiology    No results found.  Cardiac Studies   07/13/2018: TTE IMPRESSIONS 1. The left ventricle has severely reduced systolic function, with an ejection fraction of 20-25%. The cavity size was severely dilated. Left ventricular diastolic Doppler parameters are consistent with impaired relaxation. Left ventricular diffuse  hypokinesis. 2. The right ventricle has normal systolc function. The cavity was normal. 3. Mild thickening of the mitral valve leaflet. 4. The tricuspid valve was grossly normal. 5. The aortic valve is tricuspid Mild thickening of the aortic valve. No stenosis of the aortic valve. 6. Pulmonic valve regurgitation was not assessed by color flow Doppler. 7. Severe global reduction in LV systolic function; severe LVE; mild diastolic dysfunction; oscillating densities on pacer wire concerning for vegetation (largest 1.2 x  0.6 cm).  FINDINGS Left Ventricle: The left ventricle has severely reduced systolic function, with an ejection fraction of 20-25%. The cavity size was severely dilated. Left ventricular diastolic Doppler parameters are consistent with impaired relaxation (grade I). Left  ventricular diffuse hypokinesis.  Right Ventricle: The right ventricle has normal systolic function. The cavity was normal. Pacing wire/catheter visualized in the right ventricle. Left Atrium: Left atrial size was normal in size. Right Atrium: Right atrial size was normal in size. Right atrial pressure is estimated at 10 mmHg. Interatrial Septum: No atrial level shunt detected by color flow Doppler. Pericardium: There is no evidence of pericardial effusion. Mitral Valve: The mitral valve is normal in structure. Mild thickening of the mitral valve leaflet. Mitral valve regurgitation is trivial by color flow Doppler. Tricuspid Valve: The tricuspid valve was grossly normal. Tricuspid valve regurgitation is mild by color flow Doppler. Aortic Valve: The aortic valve is tricuspid Mild thickening of the aortic valve. Aortic valve regurgitation was not visualized by color flow Doppler. There is No stenosis of the aortic valve. Pulmonic Valve: Pulmonic valve regurgitation was not assessed by color flow Doppler. Venous: The inferior vena cava is normal in size with greater than 50% respiratory variability.    02/03/18: R/LHC 1. Low cardiac output.  2. Elevated left heart filling pressure (PCWP and LVEDP).  3. Patent RCA and sequential LIMA-D1/D2. The LCx system disease actually does not look as significant as on the cath 2 years ago.   No interventional target, suspect volume overload and marginal cardiac output are the causes of his symptoms. I will add digoxin 0.125 mg daily and will increase Lasix to 40 mg bid.   Patient Profile     43 y.o. male is a 43 y.o. male with a hx of CAD (PCI to LAD 2009, PTCA in-stent  re-stenosis 2010, CABG 2011, stable CAD by cath in Nov 2019), ICM, chronic CHF (systolic), ICD, COPD, stroke (2018 suspect cardioembolic (suspected as possible etiology  LV thrombus given severe LV dysfunction, none noted by TTE at the time), started on warfarin), poly substance abuse, noncompliance.  He was admitted 2/2 allergic reaction and bacteremia (MSSA).  TTE noted Severe global reduction in LV systolic function; severe LVE; mild diastolic dysfunction; oscillating densities on pacer wire concerning for vegetation (largest 1.2 x 0.6 cm).   Device information BSCi dual chamber ICD, implanted 01/02/09 RA 4136 Dextrus lead RV 0157 [employer] (dual coil) lead Last device transmission 04/20/2018 AP 0%, VP 0% Since Oct 2019 events NSVT/VT episodes, available EGMs reviewed, longest 12beats DEVICE lfetime totals 3 treated VF, one treated VT (unknown when)  Assessment & Plan     1. MSSA bacteremia 2. Endocarditis     oscillating densities on pacer wire concerning for vegetation (largest 1.2 x 0.6 cm). By TTE     WBC trending upwards, afebrile  3. Hyponatremic     As per attending 4. Thrombocytopenia 5. AKI   He is scheduled for tomorrow for extraction Plan TEE inter-op  Concerning issues for extraction/re-implant: 1. Current IV drug (poly substance) abuser (likely source of infection, and concerning when considering re-implant  2. downward trend of platelets (management deferred to IM/ID services     Today are up slighty to 75, will not give lovenox     Pending tomorrow, he may need plts pre-op  3. Xarelto, last dose was 4/14     DO NOT resume for now  4. Ongoing systemic allergic reaction     Hives 5. active opiate withdrawal symptoms     Getting suboxone  last device transmission notes + h/o therapies in device lifetime (unclear if this was device testing, or true therapies.    Will do formal device interrogation pre-op, possible need to discuss lifevest     For  questions or updates, please contact [facility] Please consult [email or web address] for contact info under   Signed, Baldwin Jamaica, PA-C  07/15/2018, 7:31 AM    EP Attending  Patient seen and examined. Agree with the findings as noted above. The patient is stable. We will plan to proceed with device extraction tomorrow afternoon. If platelets were to drop, we would need a platelet transfusion at the start of the extraction.  Mikle Bosworth.D.

## 2018-07-15 NOTE — Progress Notes (Signed)
   Subjective: No overnight events. Patient reports feeling well this morning. Denies Davenport/v abdominal pain. Feels that he is out of withdrawal. He is tolerating PO intake better this morning. States he cannot stay here for 6 weeks for IV abx treatment because he has twins at home that he takes care of. States he wants to continue on suboxone as long as necessary to prevent returning to IVDU. All questions and concerns addressed.  Objective:  Vital signs in last 24 hours: Vitals:   07/14/18 2300 07/15/18 0332 07/15/18 0400 07/15/18 0617  BP: 110/77 100/68    Pulse: 78 79    Resp: 17 (!) 22 (!) 24   Temp: 97.9 F (36.6 C) 98.4 F (36.9 C)    TempSrc: Oral Oral    SpO2: 98% 97%    Weight:    76.7 kg  Height:       Physical Exam Gen: seen comfortably laying in bed, no distress CV: RRR, no murmurs rubs or gallops  Abdomen: bowel sounds present, soft, no ttp Ext: no edema, warm and well perfused   Assessment/Plan:  Principal Problem:   Bacteremia due to methicillin susceptible Staphylococcus aureus (MSSA) Active Problems:   PAROXYSMAL VENTRICULAR TACHYCARDIA   Tobacco abuse   CKD (chronic kidney disease), stage III (HCC)   Chronic systolic congestive heart failure (HCC)   ICD (implantable cardioverter-defibrillator) infection (HCC)   Opioid use disorder ([place])   Thrombocytopenia (HCC)  John Davenport is a 43 yo M w/ PMH of CAD s/p CABG,NSTEMi w/ cocain use, Ischemic cardiomyopathy s/p ICD, HFrEF, COPD, 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 Will require inpatient stay for IV abx and sepsis work-up.  MSSA bacteremia:History of IV heroin use.Day 3 of cefazolin. TTE showed oscillating densities on pacer wire, concerning for vegetations, plan to remove ICD tomorrow. Per EP, he is not device dependent and due to IV drug use he will most likely not be a candidate for  reimplanting of ICD.  - ID and EP on board, appreciate recommendations - Continue cefazolin day 3, will need 6 weeks of IV therapy after ICD removal -F/ublood culturessusceptibilities - Repeat blood culture after ICD removed  Opiate use disorder: Patient states he uses ~3 grams of heroin daily. He last used 4/12. - Suboxone 8 mg Q8H - Zofran prn for nausea - Imodium prn for diarrhea  Hyponatremia, hypochloremia: most likely in the setting of emesis, diarrhea and decrease PO intake. Given CHF will hydrate gently this morning with NS 150 ml/hr.  - daily BMP  Heart Failure with reduced Ejection Fraction:20% EF.Euvolemic. - Continue losartan, spironolactone, digoxin, amiodarone, and carvedilol - Continue to hold furosemide for now  - Telemetry - I&Os - Daily weights  Hx of CVA 2/2 cardio-embolic source: - Held rivaroxabanyesterday for ICD removal surgery. Plan to give enoxaparin 40 mg today.   Dispo: Anticipated discharge about 6 weeks after ICD removal for IV antibiotic therapy.   John Mustard N, DO 07/15/2018, 7:30 AM Pager: [phone]

## 2018-07-15 NOTE — Progress Notes (Signed)
  Date: 07/15/2018  Patient name: John Davenport  Medical record number: [id number]  Date of birth: 10-16-1975   I have seen and evaluated this patient and I have discussed the plan of care with the house staff. Please see their note for complete details. I concur with their findings with the following additions/corrections:   Doing much better today, reports withdrawal symptoms have subsided and he was able to sleep last night.  He is aware of plan for ICD removal tomorrow.  1.  MSSA bacteremia: - Continue IV cefazolin - Plan is for ICD removal tomorrow by EP, rivaroxaban held yesterday, got 1 dose of enoxaparin today - Plan is for 6 weeks of IV antibiotics after ICD removal.  Unfortunately, he states he will not be able to remain in the hospital that long as he has 3 children he has to take care of including 72-year-old twins who are currently staying with his aunt.  Their mother died in a car crash.  We may have to consider a hybrid regimen of IV antibiotics for a period of time followed by oral antibiotics at home.  2.  Opioid use disorder with opiate withdrawal: Significantly improved after initiation of Suboxone - Continue Suboxone 8 mg 3 times daily - We will try to establish him in our [facility] clinic after discharge  3.  Hyponatremia: Likely hypovolemic due to multiple episodes of vomiting and diarrhea with poor intake, no edema on exam and JVP is at the base of the neck lying at about 30 degrees. - Give 500 cc NS slowly today and reassess tomorrow - Continue holding furosemide  4.  HFrEF from ischemic cardiomyopathy: Gentle fluids as above today, continue holding furosemide, otherwise on home medications  5.  Thrombocytopenia: Stabilized, possibly due to sepsis, also noted to be hep C positive and may have some degree of cirrhosis.  Continue to trend platelets  6.  Positive hep C titer: We will need to check viral load and genotype and consider treatment after discharge, defer  to ID  7.  Urticaria: Resolved, likely related to sepsis  8.  Acute kidney injury: Resolved, creatinine 0.9 today.  [name], M.D., Ph.D. 07/15/2018, 12:35 PM

## 2018-07-15 NOTE — Care Management (Signed)
CM acknowledges consult - CM informed attending that pt has active insurance and therefore medication assistance can not be provided per PTA list.  Also, pt will not be a candidate for home IV antibiotic therapy due to IVDA.  CM will continue to follow for discharge needs

## 2018-07-16 ENCOUNTER — Inpatient Hospital Stay (HOSPITAL_COMMUNITY): Payer: Medicare Other | Admitting: Certified Registered Nurse Anesthetist

## 2018-07-16 ENCOUNTER — Inpatient Hospital Stay (HOSPITAL_COMMUNITY): Payer: Medicare Other

## 2018-07-16 ENCOUNTER — Encounter (HOSPITAL_COMMUNITY)
Admission: EM | Discharge status: Against Medical Advice | Payer: Self-pay | Source: Home / Self Care | Attending: Internal Medicine

## 2018-07-16 ENCOUNTER — Encounter (HOSPITAL_COMMUNITY): Payer: Self-pay

## 2018-07-16 DIAGNOSIS — T827XXA Infection and inflammatory reaction due to other cardiac and vascular devices, implants and grafts, initial encounter: Secondary | ICD-10-CM

## 2018-07-16 DIAGNOSIS — R011 Cardiac murmur, unspecified: Secondary | ICD-10-CM

## 2018-07-16 DIAGNOSIS — I38 Endocarditis, valve unspecified: Secondary | ICD-10-CM

## 2018-07-16 HISTORY — PX: TEE WITHOUT CARDIOVERSION: SHX5443

## 2018-07-16 HISTORY — PX: ICD LEAD REMOVAL: SHX5855

## 2018-07-16 LAB — CBC
HCT: 42.9 % (ref 39.0–52.0)
Hemoglobin: 14.8 g/dL (ref 13.0–17.0)
MCH: 31.6 pg (ref 26.0–34.0)
MCHC: 34.5 g/dL (ref 30.0–36.0)
MCV: 91.5 fL (ref 80.0–100.0)
Platelets: 55 10*3/uL — ABNORMAL LOW (ref 150–400)
RBC: 4.69 MIL/uL (ref 4.22–5.81)
RDW: 14 % (ref 11.5–15.5)
WBC: 17.9 10*3/uL — ABNORMAL HIGH (ref 4.0–10.5)
nRBC: 0 % (ref 0.0–0.2)

## 2018-07-16 LAB — ECHO INTRAOPERATIVE TEE
Height: 75 in
Weight: 2736 oz

## 2018-07-16 LAB — BASIC METABOLIC PANEL
Anion gap: 6 (ref 5–15)
BUN: 19 mg/dL (ref 6–20)
CO2: 28 mmol/L (ref 22–32)
Calcium: 8 mg/dL — ABNORMAL LOW (ref 8.9–10.3)
Chloride: 95 mmol/L — ABNORMAL LOW (ref 98–111)
Creatinine, Ser: 0.79 mg/dL (ref 0.61–1.24)
GFR calc Af Amer: >60 mL/min (ref >60)
GFR calc non Af Amer: >60 mL/min (ref >60)
Glucose, Bld: 92 mg/dL (ref 70–99)
Potassium: 4.1 mmol/L (ref 3.5–5.1)
Sodium: 129 mmol/L — ABNORMAL LOW (ref 135–145)

## 2018-07-16 SURGERY — REMOVAL, ELECTRODE LEAD, ICD
Anesthesia: General | Site: Chest

## 2018-07-16 MED ORDER — VASOPRESSIN 20 UNIT/ML IV SOLN
INTRAVENOUS | Status: AC
  Filled 2018-07-16: qty 1

## 2018-07-16 MED ORDER — SODIUM CHLORIDE 0.9 % IV SOLN
INTRAVENOUS | Status: DC | PRN
  Administered 2018-07-16: 500 mL

## 2018-07-16 MED ORDER — SUCCINYLCHOLINE CHLORIDE 200 MG/10ML IV SOSY
PREFILLED_SYRINGE | INTRAVENOUS | Status: AC
  Filled 2018-07-16: qty 20

## 2018-07-16 MED ORDER — MIDAZOLAM HCL 5 MG/5ML IJ SOLN
INTRAMUSCULAR | Status: DC | PRN
  Administered 2018-07-16: 2 mg via INTRAVENOUS

## 2018-07-16 MED ORDER — DEXAMETHASONE SODIUM PHOSPHATE 10 MG/ML IJ SOLN
INTRAMUSCULAR | Status: AC
  Filled 2018-07-16: qty 1

## 2018-07-16 MED ORDER — LACTATED RINGERS IV SOLN
INTRAVENOUS | Status: DC
  Administered 2018-07-16: 13:00:00 via INTRAVENOUS

## 2018-07-16 MED ORDER — SUGAMMADEX SODIUM 200 MG/2ML IV SOLN
INTRAVENOUS | Status: DC | PRN
  Administered 2018-07-16: 200 mg via INTRAVENOUS

## 2018-07-16 MED ORDER — ARTIFICIAL TEARS OPHTHALMIC OINT
TOPICAL_OINTMENT | OPHTHALMIC | Status: AC
  Filled 2018-07-16: qty 3.5

## 2018-07-16 MED ORDER — ONDANSETRON HCL 4 MG/2ML IJ SOLN
4.0000 mg | Freq: Four times a day (QID) | INTRAMUSCULAR | Status: DC | PRN
  Administered 2018-07-16 – 2018-07-20 (×9): 4 mg via INTRAVENOUS
  Filled 2018-07-16 (×9): qty 2

## 2018-07-16 MED ORDER — LIDOCAINE 2% (20 MG/ML) 5 ML SYRINGE
INTRAMUSCULAR | Status: DC | PRN
  Administered 2018-07-16: 80 mg via INTRAVENOUS

## 2018-07-16 MED ORDER — MIDAZOLAM HCL 2 MG/2ML IJ SOLN
INTRAMUSCULAR | Status: AC
  Filled 2018-07-16: qty 2

## 2018-07-16 MED ORDER — PROPOFOL 10 MG/ML IV BOLUS
INTRAVENOUS | Status: DC | PRN
  Administered 2018-07-16: 20 mg via INTRAVENOUS
  Administered 2018-07-16: 150 mg via INTRAVENOUS
  Administered 2018-07-16: 30 mg via INTRAVENOUS
  Administered 2018-07-16: 50 mg via INTRAVENOUS

## 2018-07-16 MED ORDER — ROCURONIUM BROMIDE 50 MG/5ML IV SOSY
PREFILLED_SYRINGE | INTRAVENOUS | Status: AC
  Filled 2018-07-16: qty 5

## 2018-07-16 MED ORDER — ONDANSETRON HCL 4 MG/2ML IJ SOLN
INTRAMUSCULAR | Status: DC | PRN
  Administered 2018-07-16: 4 mg via INTRAVENOUS

## 2018-07-16 MED ORDER — SODIUM CHLORIDE (PF) 0.9 % IJ SOLN
INTRAMUSCULAR | Status: AC
  Filled 2018-07-16: qty 10

## 2018-07-16 MED ORDER — SODIUM CHLORIDE 0.9% IV SOLUTION: Freq: Once | INTRAVENOUS | Status: DC

## 2018-07-16 MED ORDER — EPHEDRINE 5 MG/ML INJ
INTRAVENOUS | Status: AC
  Filled 2018-07-16: qty 20

## 2018-07-16 MED ORDER — SPIRONOLACTONE 25 MG PO TABS
25.0000 mg | ORAL_TABLET | Freq: Every day | ORAL | Status: DC
  Administered 2018-07-17 – 2018-07-19 (×3): 25 mg via ORAL
  Filled 2018-07-16 (×3): qty 1

## 2018-07-16 MED ORDER — PHENYLEPHRINE 40 MCG/ML (10ML) SYRINGE FOR IV PUSH (FOR BLOOD PRESSURE SUPPORT)
PREFILLED_SYRINGE | INTRAVENOUS | Status: DC | PRN
  Administered 2018-07-16: 120 ug via INTRAVENOUS
  Administered 2018-07-16: 200 ug via INTRAVENOUS
  Administered 2018-07-16: 120 ug via INTRAVENOUS

## 2018-07-16 MED ORDER — LIDOCAINE 2% (20 MG/ML) 5 ML SYRINGE
INTRAMUSCULAR | Status: AC
  Filled 2018-07-16: qty 5

## 2018-07-16 MED ORDER — SODIUM CHLORIDE 0.9 % IV SOLN
INTRAVENOUS | Status: AC
  Filled 2018-07-16: qty 2

## 2018-07-16 MED ORDER — PROTAMINE SULFATE 10 MG/ML IV SOLN
INTRAVENOUS | Status: AC
  Filled 2018-07-16: qty 5

## 2018-07-16 MED ORDER — ROCURONIUM BROMIDE 50 MG/5ML IV SOSY
PREFILLED_SYRINGE | INTRAVENOUS | Status: DC | PRN
  Administered 2018-07-16: 50 mg via INTRAVENOUS
  Administered 2018-07-16 (×2): 20 mg via INTRAVENOUS

## 2018-07-16 MED ORDER — SODIUM CHLORIDE 0.9 % IV SOLN
INTRAVENOUS | Status: DC | PRN
  Administered 2018-07-16: 40 ug/min via INTRAVENOUS

## 2018-07-16 MED ORDER — VASOPRESSIN 20 UNIT/ML IV SOLN
INTRAVENOUS | Status: DC | PRN
  Administered 2018-07-16: 1 [IU] via INTRAVENOUS
  Administered 2018-07-16: 2 [IU] via INTRAVENOUS
  Administered 2018-07-16: 1 [IU] via INTRAVENOUS
  Administered 2018-07-16: 2 [IU] via INTRAVENOUS

## 2018-07-16 MED ORDER — HEPARIN SODIUM (PORCINE) 1000 UNIT/ML IJ SOLN
INTRAMUSCULAR | Status: AC
  Filled 2018-07-16: qty 1

## 2018-07-16 MED ORDER — PHENYLEPHRINE HCL (PRESSORS) 10 MG/ML IV SOLN
INTRAVENOUS | Status: AC
  Filled 2018-07-16: qty 1

## 2018-07-16 MED ORDER — SODIUM CHLORIDE 0.9 % IV SOLN
INTRAVENOUS | Status: AC
  Filled 2018-07-16: qty 1.2

## 2018-07-16 MED ORDER — ONDANSETRON HCL 4 MG/2ML IJ SOLN
INTRAMUSCULAR | Status: AC
  Filled 2018-07-16: qty 4

## 2018-07-16 MED ORDER — DEXMEDETOMIDINE HCL 200 MCG/2ML IV SOLN
INTRAVENOUS | Status: DC | PRN
  Administered 2018-07-16: 8 ug via INTRAVENOUS
  Administered 2018-07-16 (×2): 20 ug via INTRAVENOUS

## 2018-07-16 MED ORDER — PHENYLEPHRINE 40 MCG/ML (10ML) SYRINGE FOR IV PUSH (FOR BLOOD PRESSURE SUPPORT)
PREFILLED_SYRINGE | INTRAVENOUS | Status: AC
  Filled 2018-07-16: qty 40

## 2018-07-16 MED ORDER — FENTANYL CITRATE (PF) 100 MCG/2ML IJ SOLN: 25.0000 ug | INTRAMUSCULAR | Status: DC | PRN

## 2018-07-16 MED ORDER — LACTATED RINGERS IV SOLN
INTRAVENOUS | Status: DC | PRN
  Administered 2018-07-16 (×2): via INTRAVENOUS

## 2018-07-16 MED ORDER — ACETAMINOPHEN 325 MG PO TABS
325.0000 mg | ORAL_TABLET | ORAL | Status: DC | PRN
  Administered 2018-07-16 – 2018-07-17 (×2): 650 mg via ORAL
  Filled 2018-07-16 (×2): qty 2

## 2018-07-16 MED ORDER — DEXAMETHASONE SODIUM PHOSPHATE 10 MG/ML IJ SOLN
INTRAMUSCULAR | Status: DC | PRN
  Administered 2018-07-16: 10 mg via INTRAVENOUS

## 2018-07-16 MED ORDER — ROCURONIUM BROMIDE 10 MG/ML (PF) SYRINGE: PREFILLED_SYRINGE | INTRAVENOUS | Status: DC | PRN

## 2018-07-16 MED ORDER — PROPOFOL 10 MG/ML IV BOLUS
INTRAVENOUS | Status: AC
  Filled 2018-07-16: qty 20

## 2018-07-16 MED ORDER — SODIUM CHLORIDE 0.9 % IV SOLN
INTRAVENOUS | Status: AC
  Administered 2018-07-16: 09:00:00 via INTRAVENOUS

## 2018-07-16 MED ORDER — NICOTINE 21 MG/24HR TD PT24
21.0000 mg | MEDICATED_PATCH | Freq: Every day | TRANSDERMAL | Status: DC
  Administered 2018-07-16 – 2018-07-19 (×4): 21 mg via TRANSDERMAL
  Filled 2018-07-16 (×4): qty 1

## 2018-07-16 MED ORDER — SUCCINYLCHOLINE CHLORIDE 200 MG/10ML IV SOSY
PREFILLED_SYRINGE | INTRAVENOUS | Status: DC | PRN
  Administered 2018-07-16: 140 mg via INTRAVENOUS

## 2018-07-16 MED ORDER — CARVEDILOL 6.25 MG PO TABS
6.2500 mg | ORAL_TABLET | Freq: Two times a day (BID) | ORAL | Status: DC
  Administered 2018-07-17 – 2018-07-20 (×7): 6.25 mg via ORAL
  Filled 2018-07-16 (×7): qty 1

## 2018-07-16 MED ORDER — FENTANYL CITRATE (PF) 250 MCG/5ML IJ SOLN
INTRAMUSCULAR | Status: DC | PRN
  Administered 2018-07-16: 50 ug via INTRAVENOUS
  Administered 2018-07-16: 150 ug via INTRAVENOUS

## 2018-07-16 MED ORDER — FENTANYL CITRATE (PF) 250 MCG/5ML IJ SOLN
INTRAMUSCULAR | Status: AC
  Filled 2018-07-16: qty 5

## 2018-07-16 MED ORDER — PROTAMINE SULFATE 10 MG/ML IV SOLN
INTRAVENOUS | Status: AC
  Filled 2018-07-16: qty 25

## 2018-07-16 SURGICAL SUPPLY — 62 items
BAG BANDED W/RUBBER/TAPE 36X54 (MISCELLANEOUS) IMPLANT
BAG DECANTER FOR FLEXI CONT (MISCELLANEOUS) IMPLANT
BLADE 10 SAFETY STRL DISP (BLADE) ×4 IMPLANT
BLADE CLIPPER SURG (BLADE) IMPLANT
BLADE CORE FAN STRYKER (BLADE) ×4 IMPLANT
BLADE OSCILLATING /SAGITTAL (BLADE) IMPLANT
BLADE STERNUM SYSTEM 6 (BLADE) IMPLANT
BLADE SURG ROTATE 9660 (MISCELLANEOUS) ×8 IMPLANT
BNDG COHESIVE 4X5 WHT NS (GAUZE/BANDAGES/DRESSINGS) ×4 IMPLANT
CANISTER SUCT 3000ML PPV (MISCELLANEOUS) ×4 IMPLANT
COIL ONE TIE COMPRESSION (VASCULAR PRODUCTS) ×4 IMPLANT
COVER BACK TABLE 60X90IN (DRAPES) ×4 IMPLANT
COVER SURGICAL LIGHT HANDLE (MISCELLANEOUS) ×4 IMPLANT
COVER WAND RF STERILE (DRAPES) IMPLANT
DEVICE LCKNG LEAD CARDIAC (CATHETERS) ×2 IMPLANT
DRAPE C-ARM 42X72 X-RAY (DRAPES) IMPLANT
DRAPE CARDIOVASCULAR INCISE (DRAPES) ×2
DRAPE HALF SHEET 40X57 (DRAPES) ×8 IMPLANT
DRAPE INCISE IOBAN 66X45 STRL (DRAPES) IMPLANT
DRAPE SRG 135X102X78XABS (DRAPES) ×2 IMPLANT
DRSG TEGADERM 4X4.75 (GAUZE/BANDAGES/DRESSINGS) ×4 IMPLANT
ELECT REM PT RETURN 9FT ADLT (ELECTROSURGICAL) ×8
ELECTRODE REM PT RTRN 9FT ADLT (ELECTROSURGICAL) ×4 IMPLANT
FELT TEFLON 1X6 (MISCELLANEOUS) IMPLANT
GAUZE 4X4 16PLY RFD (DISPOSABLE) IMPLANT
GAUZE SPONGE 4X4 12PLY STRL (GAUZE/BANDAGES/DRESSINGS) ×4 IMPLANT
GAUZE SPONGE 4X4 12PLY STRL LF (GAUZE/BANDAGES/DRESSINGS) ×4 IMPLANT
GLOVE BIO SURGEON STRL SZ 6.5 (GLOVE) ×6 IMPLANT
GLOVE BIO SURGEONS STRL SZ 6.5 (GLOVE) ×2
GLOVE BIOGEL PI IND STRL 7.5 (GLOVE) ×2 IMPLANT
GLOVE BIOGEL PI INDICATOR 7.5 (GLOVE) ×2
GLOVE ECLIPSE 8.0 STRL XLNG CF (GLOVE) ×4 IMPLANT
GOWN STRL REUS W/ TWL LRG LVL3 (GOWN DISPOSABLE) ×8 IMPLANT
GOWN STRL REUS W/ TWL XL LVL3 (GOWN DISPOSABLE) IMPLANT
GOWN STRL REUS W/TWL LRG LVL3 (GOWN DISPOSABLE) ×8
GOWN STRL REUS W/TWL XL LVL3 (GOWN DISPOSABLE)
KIT TURNOVER KIT B (KITS) ×4 IMPLANT
NS IRRIG 1000ML POUR BTL (IV SOLUTION) IMPLANT
PAD ARMBOARD 7.5X6 YLW CONV (MISCELLANEOUS) ×8 IMPLANT
PAD ELECT DEFIB RADIOL ZOLL (MISCELLANEOUS) ×4 IMPLANT
PENCIL BUTTON HOLSTER BLD 10FT (ELECTRODE) ×4 IMPLANT
REMOVAL LLD CARDIAC LEAD EZ (CATHETERS) ×4
SHEATH EVOLUTION RL 11F (SHEATH) ×4 IMPLANT
SHEATH EVOLUTION SHORTE RL 11F (SHEATH) ×4 IMPLANT
SHEATH TIGHTRAIL MECH (SHEATH) ×1
SHEATH TIGHTRAIL MECH 13F (SHEATH) ×3 IMPLANT
SPONGE GAUZE 2X2 8PLY STER LF (GAUZE/BANDAGES/DRESSINGS) ×1
SPONGE GAUZE 2X2 8PLY STRL LF (GAUZE/BANDAGES/DRESSINGS) ×3 IMPLANT
STYLET LIBERATOR LOCKING (MISCELLANEOUS) ×4 IMPLANT
SUT PROLENE 2 0 CT2 30 (SUTURE) IMPLANT
SUT PROLENE 2 0 SH DA (SUTURE) IMPLANT
SUT SILK  1 MH (SUTURE) ×2
SUT SILK 1 MH (SUTURE) ×2 IMPLANT
SUT VIC AB 2-0 CT2 18 VCP726D (SUTURE) IMPLANT
SUT VIC AB 3-0 X1 27 (SUTURE) IMPLANT
TAPE CLOTH SURG 4X10 WHT LF (GAUZE/BANDAGES/DRESSINGS) ×4 IMPLANT
TOWEL OR 17X24 6PK STRL BLUE (TOWEL DISPOSABLE) ×4 IMPLANT
TOWEL OR 17X26 10 PK STRL BLUE (TOWEL DISPOSABLE) ×4 IMPLANT
TRAY FOLEY MTR SLVR 16FR STAT (SET/KITS/TRAYS/PACK) ×4 IMPLANT
TUBE CONNECTING 12'X1/4 (SUCTIONS) ×1
TUBE CONNECTING 12X1/4 (SUCTIONS) ×3 IMPLANT
YANKAUER SUCT BULB TIP NO VENT (SUCTIONS) ×4 IMPLANT

## 2018-07-16 NOTE — Progress Notes (Signed)
Called report to short stay/OR. RN was advised to obtain temp and place mask on patient.

## 2018-07-16 NOTE — Anesthesia Postprocedure Evaluation (Signed)
Anesthesia Post Note  Patient: John Davenport  Procedure(s) Performed: ICD LEAD REMOVAL (Left Chest) TRANSESOPHAGEAL ECHOCARDIOGRAM (TEE) (N/A )     Patient location during evaluation: PACU Anesthesia Type: General Level of consciousness: awake Pain management: pain level controlled Vital Signs Assessment: post-procedure vital signs reviewed and stable Respiratory status: spontaneous breathing Cardiovascular status: stable Postop Assessment: no apparent nausea or vomiting Anesthetic complications: no    Last Vitals:  Vitals:   07/16/18 1625 07/16/18 1630  BP: (!) 85/58 (!) 86/57  Pulse: 72 73  Resp: 18 16  Temp:  36.5 C  SpO2: 100% 100%    Last Pain:  Vitals:   07/16/18 1630  TempSrc:   PainSc: 0-No pain                 Venita Seng

## 2018-07-16 NOTE — Progress Notes (Signed)
  Date: 07/16/2018  Patient name: John Davenport  Medical record number: [id number]  Date of birth: 02/01/1976   I have seen and evaluated this patient and I have discussed the plan of care with the house staff. Please see their note for complete details. I concur with their findings with the following additions/corrections:   Looking better today, he reports no complaints except some ongoing nausea.  We are continuing IV cefazolin and plan for ICD removal today.  We will check blood cultures after it is removed and plan 6 weeks of antibiotics after removal.  Doing well on Suboxone 8 mg 3 times daily.  Thrombocytopenia a little worse today, EP giving platelet transfusion before ICD removal.  Hyponatremia slightly improved after some IV fluids yesterday, likely combination of hypovolemic hyponatremia as well as SIADH.  Continue some IV fluids today as he is n.p.o. for surgery.  [name], M.D., Ph.D. 07/16/2018, 12:49 PM

## 2018-07-16 NOTE — Progress Notes (Signed)
   Subjective: No overnight events. Patient reports feeling well this morning. Continues to have some abdominal pain and nausea but states zofran helps alleviate most of his symptoms. All questions and concerns addressed.  Objective:  Vital signs in last 24 hours: Vitals:   07/15/18 1855 07/15/18 1955 07/15/18 2358 07/16/18 0449  BP: 95/61 109/71 100/71 101/70  Pulse:  75    Resp:  (!) 21 16   Temp:  99 F (37.2 C) 98.4 F (36.9 C) 97.6 F (36.4 C)  TempSrc:  Oral Oral Oral  SpO2:  100%  100%  Weight:    77.8 kg  Height:       Physical Exam Gen: seen comfortably sleeping in bed, no distress CV: RRR, no murmurs gallops or rubs Ext: no edema, warm and well perfused, no hives/rash appreciated   Assessment/Plan:  Principal Problem:   Bacteremia due to methicillin susceptible Staphylococcus aureus (MSSA) Active Problems:   PAROXYSMAL VENTRICULAR TACHYCARDIA   Tobacco abuse   CKD (chronic kidney disease), stage III (HCC)   Chronic systolic congestive heart failure (HCC)   ICD (implantable cardioverter-defibrillator) infection (HCC)   Opioid use disorder (HCC)   Thrombocytopenia (HCC)   Hepatitis C infection   Hyponatremia  John Davenport is a 43 yo M w/ PMH of CAD s/p CABG,NSTEMi w/ cocain use, Ischemic cardiomyopathy s/p ICD, HFrEF, COPD, 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  MSSA bacteremia:2/2 IVDU.Day 4 of cefazolin. Plan for ICD removal today. - ID and EP on board, appreciate recommendations - Continue cefazolin day 4, will need 6 weeks of IV therapy after ICD removal -F/ublood culturessusceptibilities - Repeat blood culture after ICD removed  Opiateuse disorder: Out of withdrawal and stable on suboxone. Patientstates he uses ~3 grams of heroin daily, last used 4/12. - Suboxone8 mg Q8H - Zofran prn for nausea - Imodium prn for diarrhea  Hyponatremia, hypochloremia: most  likely in the setting of emesis, diarrhea and decrease PO intake. Will continue IVF today in hopes of improving electrolytes.  - Normal Saline 100 ml/hr for ten hours  - daily BMP  Heart Failure with reduced Ejection Fraction:20% EF.Euvolemic. - Continuelosartan, spironolactone,digoxin, amiodarone, and carvedilol - Continue to hold furosemide for now  - Telemetry - I&Os - Daily weights  Hx of CVA 2/2 cardio-embolic source: -Holdingrivaroxabanfor ICD removal surgery, given enoxaparin 40 mg yesterday. Anticoagulation will be revisited post-op given his history.   Dispo: Anticipated discharge in approximately 6 weeks after ICD removal for IV antibiotic therapy.   Rehman, Areeg N, DO 07/16/2018, 7:06 AM Pager: [phone]

## 2018-07-16 NOTE — Plan of Care (Signed)

## 2018-07-16 NOTE — Progress Notes (Signed)
Subjective:  Underwent explantation of ICD earlier today; continues to complain of pain but less so than days prior   Antibiotics:  Anti-infectives (From admission, onward)   Start     Dose/Rate Route Frequency Ordered Stop   07/16/18 1145  gentamicin (GARAMYCIN) 80 mg in sodium chloride 0.9 % 500 mL irrigation     80 mg Irrigation To [facility] 07/15/18 0934 07/17/18 1145   07/16/18 1030  ceFAZolin (ANCEF) IVPB 2g/100 mL premix     2 g 200 mL/hr over 30 Minutes Intravenous To [facility]Stay [facility] 07/15/18 0934 07/17/18 1030   07/13/18 1300  ceFAZolin (ANCEF) IVPB 2g/100 mL premix     2 g 200 mL/hr over 30 Minutes Intravenous Every 8 hours 07/13/18 1253     07/13/18 0000  nafcillin injection 2 g  Status:  Discontinued     2 g Intravenous Every 4 hours 07/12/18 2059 07/12/18 2125   07/13/18 0000  nafcillin 2 g in sodium chloride 0.9 % 100 mL IVPB  Status:  Discontinued     2 g 200 mL/hr over 30 Minutes Intravenous Every 4 hours 07/12/18 2125 07/13/18 1231   07/12/18 1830  vancomycin (VANCOCIN) IVPB 1000 mg/200 mL premix     1,000 mg 200 mL/hr over 60 Minutes Intravenous  Once 07/12/18 1820 07/12/18 1942      Medications: Scheduled Meds: . sodium chloride   Intravenous Once  . amiodarone  200 mg Oral BID  . buprenorphine-naloxone  1 tablet Sublingual Q8H  . [START ON 07/17/2018] carvedilol  6.25 mg Oral BID  . chlorhexidine  60 mL Topical Once  . digoxin  125 mcg Oral Daily  . gentamicin irrigation  80 mg Irrigation To SS-Surg  . losartan  25 mg Oral Daily  . [START ON 07/17/2018] spironolactone  25 mg Oral QHS   Continuous Infusions: . sodium chloride 100 mL/hr at 07/16/18 0900  .  ceFAZolin (ANCEF) IV 2 g (07/16/18 0504)  .  ceFAZolin (ANCEF) IV     PRN Meds:.acetaminophen **OR** acetaminophen, calcium carbonate, diphenhydrAMINE, loperamide, ondansetron (ZOFRAN) IV, ondansetron    Objective: Weight change: 1.097 kg  Intake/Output Summary (Last 24  hours) at 07/16/2018 1224 Last data filed at 07/16/2018 1100 Gross per 24 hour  Intake 1390 ml  Output 1125 ml  Net 265 ml   Blood pressure 110/77, pulse 97, temperature 98.5 F (36.9 C), temperature source Oral, resp. rate (!) 21, height 6\' 3"  (1.905 m), weight 77.8 kg, SpO2 100 %. Temp:  [97.6 F (36.4 C)-99 F (37.2 C)] 98.5 F (36.9 C) (04/17 1211) Pulse Rate:  [67-97] 97 (04/17 1211) Resp:  [16-21] 21 (04/17 1211) BP: (87-110)/(51-77) 110/77 (04/17 1211) SpO2:  [98 %-100 %] 100 % (04/17 1211) Weight:  [77.8 kg] 77.8 kg (04/17 0449)  Physical Exam: General: Alert and awake, oriented x3, less agitated but still anxious/irritiable at times. HEENT: anicteric sclera, EOMI CVS regular rate, normal, I/VI SEM at LLSB, ICD intact to LT chest wall w/o erythema but slight tenderness  Chest: mild wheezing throughout, CTA bilaterally, no respiratory distress Abdomen: soft non-distended, +BS (hypoactive) Extremities: trace LE edema or deformity noted bilaterally Skin: His urticarial rash is improved/nearly resolved; abrasions to RT chest wall are healing, no [place] lesions or splinter hemorrhages Neuro: nonfocal, gait was not assessed, CN II-XII grossly intact  CBC:    Component Value Date/Time   WBC 17.9 (H) 07/16/2018 0210   RBC 4.69 07/16/2018 0210   HGB  14.8 07/16/2018 0210   HCT 42.9 07/16/2018 0210   PLT 55 (L) 07/16/2018 0210   MCV 91.5 07/16/2018 0210   MCH 31.6 07/16/2018 0210   MCHC 34.5 07/16/2018 0210   RDW 14.0 07/16/2018 0210   LYMPHSABS 0.3 (L) 07/12/2018 1839   MONOABS 0.3 07/12/2018 1839   EOSABS 0.0 07/12/2018 1839   BASOSABS 0.0 07/12/2018 1839      BMET Recent Labs    07/15/18 0218 07/16/18 0210  NA 127* 129*  K 3.7 4.1  CL 93* 95*  CO2 22 28  GLUCOSE 101* 92  BUN 20 19  CREATININE 0.86 0.79  CALCIUM 8.2* 8.0*     Liver Panel  No results for input(s): PROT, ALBUMIN, AST, ALT, ALKPHOS, BILITOT, BILIDIR, IBILI in the last 72 hours.      Sedimentation Rate Recent Labs    07/14/18 0301  ESRSEDRATE 40*   C-Reactive Protein Recent Labs    07/14/18 0301  CRP 15.4*    Micro Results: Recent Results (from the past 720 hour(s))  Culture, blood (routine x 2)     Status: Abnormal   Collection Time: 07/11/18 10:45 PM  Result Value Ref Range Status   Specimen Description BLOOD LEFT HAND  Final   Special Requests   Final    BOTTLES DRAWN AEROBIC AND ANAEROBIC Blood Culture results may not be optimal due to an inadequate volume of blood received in culture bottles   Culture  Setup Time   Final    GRAM POSITIVE COCCI IN CLUSTERS IN BOTH AEROBIC AND ANAEROBIC BOTTLES CRITICAL RESULT CALLED TO, READ BACK BY AND VERIFIED WITH: RN [place], [place] 1303 [id number] FCP Performed at [facility], [place] [address], [place], [place] [address]    Culture STAPHYLOCOCCUS AUREUS (A)  Final   Report Status 07/14/2018 FINAL  Final   Organism ID, Bacteria STAPHYLOCOCCUS AUREUS  Final      Susceptibility   Staphylococcus aureus - MIC*    CIPROFLOXACIN <=0.5 SENSITIVE Sensitive     ERYTHROMYCIN <=0.25 SENSITIVE Sensitive     GENTAMICIN <=0.5 SENSITIVE Sensitive     OXACILLIN 0.5 SENSITIVE Sensitive     TETRACYCLINE <=1 SENSITIVE Sensitive     VANCOMYCIN <=0.5 SENSITIVE Sensitive     TRIMETH/SULFA <=10 SENSITIVE Sensitive     CLINDAMYCIN <=0.25 SENSITIVE Sensitive     RIFAMPIN <=0.5 SENSITIVE Sensitive     Inducible Clindamycin NEGATIVE Sensitive     * STAPHYLOCOCCUS AUREUS  Blood Culture ID Panel (Reflexed)     Status: Abnormal   Collection Time: 07/11/18 10:45 PM  Result Value Ref Range Status   Enterococcus species NOT DETECTED NOT DETECTED Final   Listeria monocytogenes NOT DETECTED NOT DETECTED Final   Staphylococcus species DETECTED (A) NOT DETECTED Final    Comment: CRITICAL RESULT CALLED TO, READ BACK BY AND VERIFIED WITH: RN WOFFORD, R 1303 [id number] FCP    Staphylococcus aureus (BCID) DETECTED (A) NOT DETECTED Final    Comment:  Methicillin (oxacillin) susceptible Staphylococcus aureus (MSSA). Preferred therapy is anti staphylococcal beta lactam antibiotic (Cefazolin or Nafcillin), unless clinically contraindicated. CRITICAL RESULT CALLED TO, READ BACK BY AND VERIFIED WITH: RN WOFFORD, R 1303 [id number] FCP    Methicillin resistance NOT DETECTED NOT DETECTED Final   Streptococcus species NOT DETECTED NOT DETECTED Final   Streptococcus agalactiae NOT DETECTED NOT DETECTED Final   Streptococcus pneumoniae NOT DETECTED NOT DETECTED Final   Streptococcus pyogenes NOT DETECTED NOT DETECTED Final   Acinetobacter baumannii NOT DETECTED NOT DETECTED  Final   Enterobacteriaceae species NOT DETECTED NOT DETECTED Final   Enterobacter cloacae complex NOT DETECTED NOT DETECTED Final   Escherichia coli NOT DETECTED NOT DETECTED Final   Klebsiella oxytoca NOT DETECTED NOT DETECTED Final   Klebsiella pneumoniae NOT DETECTED NOT DETECTED Final   Proteus species NOT DETECTED NOT DETECTED Final   Serratia marcescens NOT DETECTED NOT DETECTED Final   Haemophilus influenzae NOT DETECTED NOT DETECTED Final   Neisseria meningitidis NOT DETECTED NOT DETECTED Final   Pseudomonas aeruginosa NOT DETECTED NOT DETECTED Final   Candida albicans NOT DETECTED NOT DETECTED Final   Candida glabrata NOT DETECTED NOT DETECTED Final   Candida krusei NOT DETECTED NOT DETECTED Final   Candida parapsilosis NOT DETECTED NOT DETECTED Final   Candida tropicalis NOT DETECTED NOT DETECTED Final    Comment: Performed at [facility], [place] [address] [place], [place] [address]  Culture, blood (routine x 2)     Status: Abnormal   Collection Time: 07/11/18 10:49 PM  Result Value Ref Range Status   Specimen Description BLOOD LEFT ARM  Final   Special Requests   Final    BOTTLES DRAWN AEROBIC AND ANAEROBIC Blood Culture results may not be optimal due to an inadequate volume of blood received in culture bottles   Culture  Setup Time   Final    GRAM POSITIVE  COCCI IN CLUSTERS IN BOTH AEROBIC AND ANAEROBIC BOTTLES CRITICAL VALUE NOTED.  VALUE IS CONSISTENT WITH PREVIOUSLY REPORTED AND CALLED VALUE.    Culture (A)  Final    STAPHYLOCOCCUS AUREUS SUSCEPTIBILITIES PERFORMED ON PREVIOUS CULTURE WITHIN THE LAST 5 DAYS. Performed at [facility], [place] [address], [place], [place] [address]    Report Status 07/14/2018 FINAL  Final  MRSA PCR Screening     Status: None   Collection Time: 07/12/18 10:48 PM  Result Value Ref Range Status   MRSA by PCR NEGATIVE NEGATIVE Final    Comment:        The GeneXpert MRSA Assay (FDA approved for NASAL specimens only), is one component of a comprehensive MRSA colonization surveillance program. It is not intended to diagnose MRSA infection nor to guide or monitor treatment for MRSA infections. Performed at [facility], [place] [address] [place], [place] [address]   Surgical pcr screen     Status: None   Collection Time: 07/15/18  3:52 PM  Result Value Ref Range Status   MRSA, PCR NEGATIVE NEGATIVE Final   Staphylococcus aureus NEGATIVE NEGATIVE Final    Comment: (NOTE) The Xpert SA Assay (FDA approved for NASAL specimens in patients 32 years of age and older), is one component of a comprehensive surveillance program. It is not intended to diagnose infection nor to guide or monitor treatment. Performed at [facility], [place] [address], [place], [place] [address]     Studies/Results: No results found.    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 male with   past medical history significant for coronary  artery disease status post coronary bypass grafting, ischemic cardiomyopathy with ICD, COPD, prior embolic stroke, active IV drug use who was admitted with fever, hives, and chest pain subsequently found to have MSSA sepsis/endocarditis and multiple vegetations on his ICD.   1.  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 He will now need 6 weeks of IV antibiotics ideally, which will need to take place in the hospital due to his polysubstance abuse. Due to his co-morbidties, implantation of a new device will be avoided as EP does NOT feel the patient is device dependent. His continued IV drug use poses a significant risk if a device were to be re-implanted as the rates of re-infection would remain elevated significantly. While rifampin would typically be added, he remains on xarelto for his ischemic CM serving as a contra-indication for concurrent rifampin tx. Continue cefazolin 2 gm IV q 8 hrs until 08/27/2018. Repeat blood cxs x 2 following device explantation today to assess for clearance of his BSI.  2.  IV drug use: He is getting Suboxone now which seems to be calming him down.  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 Pt informed of dx. He denies sharing needles but admits to injecting 3 gm of heroin/day for the past 1 year in total. He has never received methadone as an OP and has never attended any support group or structured rehab/dependency counselling.  3. Rash - curious urticarial rash was likely due to his severe staph infection as it is now resolving. Unlikely to be due to his current cefazolin as the rash was present prior to initiation of ABX.     LOS: 4 days   John Davenport 07/16/2018, 12:24 PM

## 2018-07-16 NOTE — Op Note (Signed)
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  The anesthesia service was utilized to provide general endotracheal anesthesia and also placed a right radial artery catheter in for invasive hemodynamic monitoring.  A TEE was placed.  After the usual timeout and preparation, a 6 French sheath was inserted percutaneously in the right femoral vein.  Attention was then turned to removal of the defibrillator system.  A 6 cm incision was carried out over the left infraclavicular region and a combination of blunt dissection and electrocautery was utilized to dissect down to the ICD generator which was removed with gentle traction.  The generator and the leads were disconnected.  Electrocautery was utilized to free up the scar tissue around the atrial and defibrillator leads.  Stylets were inserted into both leads and helix was retracted.  The stylette was removed from the atrial lead and the lead was cut.  A [employer] locking stylette was inserted into the atrial lead and locked in place.  A Cook 72 French RL short dissection sheath was then utilized to dissect down to the junction of the subclavian vein and the innominate vein.  The Cook 11 [place] RL long sheath was then inserted over the atrial pacing lead and advanced utilizing a combination of traction and countertraction, pressure and counterpressure down to the tip of the right atrial lead.  The lead was removed in total with no hemodynamic sequelae.  Attention was then turned to the defibrillator lead.  This is a 95 [employer], engineering.  After the  initial stylette was inserted into the lead and the helix retracted, the stylette was removed and the lead was cut.  A Cook liberator locking stylette was then advanced into the body of the lead down to the tip and locked in place in the usual manner.  Control of the proximal portion of the lead was carried out utilizing a 1 tie suture.  The [facility] 11 [place] RL short dissection sheath was then advanced over the defibrillator lead down to the junction of the subclavian and innominate veins.  The [facility] 11 [place] RL long dissection sheath was then advanced after removal of the short sheath and got down to the portion of the lead where the proximal coil began.  At this point it appeared that the lead body was starting to come apart and fragments of the lead were visible under fluoroscopy.  The [employer] 13 [place] extraction sheath was then advanced over the lead and got down to the junction of the superior vena cava and the innominate vein where it became stuck.  The [employer] inner sheath was removed and the outer sheath was placed over the inner sheath and utilizing a combination of pressure and counterpressure, traction and countertraction, the defibrillator lead was removed after the [employer] outer 13 French sheath was advanced over the proximal coil, distal coil, down to the tip of the lead.  The lead was removed in total with no hemodynamic sequelae.  Pressure was held over the entry site of the lead and a figure-of-eight silk suture was placed to assure hemostasis.  At this point the pocket was irrigated with antibiotic irrigation.  The incision was closed  with multiple 3-0 Prolene mattress suture.  A bandage placed and the patient was returned to his room in stable condition.  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

## 2018-07-16 NOTE — Progress Notes (Signed)
Patient refused to have his chest clipped and first Hibiclens bath taken before midnight for his procedure. Patient repeatedly stated he was not ready. MD came to bedside but was unable to convince patient. MD recommended to try again later when patient is more awake.

## 2018-07-16 NOTE — Progress Notes (Signed)
CARDIOTHORACIC SURGERY OPERATIVE PROCEDURE BACK UP NOTE  Date of Procedure:   07/16/2018  Surgeon:    Valentina Gu. Roxy Manns, MD   I was requested by Dr. Lovena Le to be on standby for the pacemaker explant/lead extraction performed on John Davenport on 07/16/2018. I arrived at the operating room at 2:00pm and departed at 4:15pm    [place]. Roxy Manns MD 07/16/2018 3:47 PM

## 2018-07-16 NOTE — Progress Notes (Signed)
  Echocardiogram Echocardiogram Transesophageal has been performed.  John Davenport 07/16/2018, 3:48 PM

## 2018-07-16 NOTE — Progress Notes (Signed)
Progress Note  Patient Name: John Davenport Date of Encounter: 07/16/2018  Primary Cardiologist: Dr. Aundra Dubin EP: Dr. Rayann Heman  Subjective   Stable overnight, denies chest pain or sob.   Inpatient Medications    Scheduled Meds: . amiodarone  200 mg Oral BID  . buprenorphine-naloxone  1 tablet Sublingual Q8H  . carvedilol  6.25 mg Oral BID  . chlorhexidine  60 mL Topical Once  . digoxin  125 mcg Oral Daily  . gentamicin irrigation  80 mg Irrigation To SS-Surg  . losartan  25 mg Oral Daily  . spironolactone  25 mg Oral QHS   Continuous Infusions: . sodium chloride    .  ceFAZolin (ANCEF) IV 2 g (07/16/18 0504)  .  ceFAZolin (ANCEF) IV     PRN Meds: acetaminophen **OR** acetaminophen, calcium carbonate, diphenhydrAMINE, loperamide, ondansetron (ZOFRAN) IV, ondansetron   Vital Signs    Vitals:   07/15/18 1855 07/15/18 1955 07/15/18 2358 07/16/18 0449  BP: 95/61 109/71 100/71 101/70  Pulse:  75    Resp:  (!) 21 16   Temp:  99 F (37.2 C) 98.4 F (36.9 C) 97.6 F (36.4 C)  TempSrc:  Oral Oral Oral  SpO2:  100%  100%  Weight:    77.8 kg  Height:        Intake/Output Summary (Last 24 hours) at 07/16/2018 0810 Last data filed at 07/16/2018 0526 Gross per 24 hour  Intake 2080 ml  Output 825 ml  Net 1255 ml   Last 3 Weights 07/16/2018 07/15/2018 07/14/2018  Weight (lbs) 171 lb 8.3 oz 169 lb 1.6 oz 169 lb 1.5 oz  Weight (kg) 77.8 kg 76.703 kg 76.7 kg      Telemetry    SR - Personally Reviewed  ECG    No new EKGs - Personally Reviewed  Physical Exam     exam today remains essentially unchanged GEN: No acute distress.   Neck: No JVD Cardiac:  RRR, no murmurs, rubs, or gallops.  Respiratory: no increased work of breathing GI:  non-distended  MS: No edema; No deformity. Neuro:  Nonfocal  Psych: Normal affect   Labs    Chemistry Recent Labs  Lab 07/11/18 1912  07/12/18 1839  07/14/18 0301 07/15/18 0218 07/16/18 0210  NA  --    < > 129*   < >  128* 127* 129*  K  --    < > 4.0   < > 3.6 3.7 4.1  CL  --    < > 95*   < > 95* 93* 95*  CO2  --    < > 20*   < > 22 22 28   GLUCOSE  --    < > 108*   < > 114* 101* 92  BUN  --    < > 35*   < > 27* 20 19  CREATININE  --    < > 2.26*   < > 1.16 0.86 0.79  CALCIUM  --    < > 8.5*   < > 8.4* 8.2* 8.0*  PROT 7.1  --  7.3  --   --   --   --   ALBUMIN 2.9*  --  2.9*  --   --   --   --   AST 42*  --  51*  --   --   --   --   ALT 36  --  40  --   --   --   --  ALKPHOS 65  --  50  --   --   --   --   BILITOT 1.2  --  1.0  --   --   --   --   GFRNONAA  --    < > 34*   < > >60 >60 >60  GFRAA  --    < > 39*   < > >60 >60 >60  ANIONGAP  --    < > 14   < > 11 12 6    < > = values in this interval not displayed.     Hematology Recent Labs  Lab 07/14/18 0301 07/15/18 0218 07/16/18 0210  WBC 15.8* 17.4* 17.9*  RBC 4.52 4.69 4.69  HGB 14.0 14.8 14.8  HCT 41.2 42.3 42.9  MCV 91.2 90.2 91.5  MCH 31.0 31.6 31.6  MCHC 34.0 35.0 34.5  RDW 13.9 13.8 14.0  PLT 64* 75* 55*    Cardiac Enzymes Recent Labs  Lab 07/11/18 1912 07/12/18 0111 07/12/18 0551  TROPONINI <0.03 <0.03 <0.03   No results for input(s): TROPIPOC in the last 168 hours.   BNP Recent Labs  Lab 07/12/18 0109 07/12/18 1839  BNP 876.0* 413.1*     DDimer No results for input(s): DDIMER in the last 168 hours.   Radiology    No results found.  Cardiac Studies   07/13/2018: TTE IMPRESSIONS 1. The left ventricle has severely reduced systolic function, with an ejection fraction of 20-25%. The cavity size was severely dilated. Left ventricular diastolic Doppler parameters are consistent with impaired relaxation. Left ventricular diffuse  hypokinesis. 2. The right ventricle has normal systolc function. The cavity was normal. 3. Mild thickening of the mitral valve leaflet. 4. The tricuspid valve was grossly normal. 5. The aortic valve is tricuspid Mild thickening of the aortic valve. No stenosis of the aortic valve.  6. Pulmonic valve regurgitation was not assessed by color flow Doppler. 7. Severe global reduction in LV systolic function; severe LVE; mild diastolic dysfunction; oscillating densities on pacer wire concerning for vegetation (largest 1.2 x 0.6 cm).  FINDINGS Left Ventricle: The left ventricle has severely reduced systolic function, with an ejection fraction of 20-25%. The cavity size was severely dilated. Left ventricular diastolic Doppler parameters are consistent with impaired relaxation (grade I). Left  ventricular diffuse hypokinesis.  Right Ventricle: The right ventricle has normal systolic function. The cavity was normal. Pacing wire/catheter visualized in the right ventricle. Left Atrium: Left atrial size was normal in size. Right Atrium: Right atrial size was normal in size. Right atrial pressure is estimated at 10 mmHg. Interatrial Septum: No atrial level shunt detected by color flow Doppler. Pericardium: There is no evidence of pericardial effusion. Mitral Valve: The mitral valve is normal in structure. Mild thickening of the mitral valve leaflet. Mitral valve regurgitation is trivial by color flow Doppler. Tricuspid Valve: The tricuspid valve was grossly normal. Tricuspid valve regurgitation is mild by color flow Doppler. Aortic Valve: The aortic valve is tricuspid Mild thickening of the aortic valve. Aortic valve regurgitation was not visualized by color flow Doppler. There is No stenosis of the aortic valve. Pulmonic Valve: Pulmonic valve regurgitation was not assessed by color flow Doppler. Venous: The inferior vena cava is normal in size with greater than 50% respiratory variability.    02/03/18: R/LHC 1. Low cardiac output.  2. Elevated left heart filling pressure (PCWP and LVEDP).  3. Patent RCA and sequential LIMA-D1/D2. The LCx system disease actually does not look as significant  as on the cath 2 years ago.   No interventional target, suspect volume overload  and marginal cardiac output are the causes of his symptoms. I will add digoxin 0.125 mg daily and will increase Lasix to 40 mg bid.   Patient Profile     43 y.o. male is a 43 y.o. male with a hx of CAD (PCI to LAD 2009, PTCA in-stent re-stenosis 2010, CABG 2011, stable CAD by cath in Nov 2019), ICM, chronic CHF (systolic), ICD, COPD, stroke (2018 suspect cardioembolic (suspected as possible etiology LV thrombus given severe LV dysfunction, none noted by TTE at the time), started on warfarin), poly substance abuse, noncompliance.  He was admitted 2/2 allergic reaction and bacteremia (MSSA).  TTE noted Severe global reduction in LV systolic function; severe LVE; mild diastolic dysfunction; oscillating densities on pacer wire concerning for vegetation (largest 1.2 x 0.6 cm).   Device information BSCi dual chamber ICD, implanted 01/02/09 RA 4136 Dextrus lead RV 0157 [employer] (dual coil) lead Last device transmission 04/20/2018 AP 0%, VP 0% Since Oct 2019 events NSVT/VT episodes, available EGMs reviewed, longest 12beats DEVICE lfetime totals 3 treated VF, one treated VT (unknown when)  Assessment & Plan     1. MSSA bacteremia 2. Endocarditis     oscillating densities on pacer wire concerning for vegetation (largest 1.2 x 0.6 cm). By TTE     WBC trending upwards, 17.9 today, remains afebrile  3. Hyponatremic     As per attending     Up to 129 today 4. Thrombocytopenia      plts 55 today 5. AKI     Remains much improved/resolved   He is scheduled for this afternoon for device system extraction Plan TEE inter-op  RN note reports patient refused chest prep last night   Concerning issues for extraction/re-implant: 1. Current IV drug (poly substance) abuser (likely source of infection, and concerning when considering re-implant       is not candidate for re-implantation, ?lifevest       will certainly complicate ability to complete a course of long term out patient IV  antibiotics   2. downward trend of platelets (management deferred to IM/ID services     Platelets today are 55     We will need to transfuse a 6 pack of platelets pre-op   3. Xarelto, last dose was 4/14     DO NOT resume for now     Will revisit post op given h/o of CVA historically suspect 2/2 severe LV dysfunction  4. Ongoing systemic allergic reaction     Hives      improved  5. active opiate withdrawal symptoms     Getting suboxone    last device transmission notes + h/o therapies in device lifetime (unclear if this was device testing, or true therapies.    Will do formal device interrogation pre-op, possible need to discuss lifevest     For questions or updates, please contact [place] Please consult [email or web address] for contact info under   Signed, Baldwin Jamaica, PA-C  07/16/2018, 8:10 AM    EP Attending  Patient seen and examined. Stable over night. His platelet count is down today and we will need to transfuse a 6 pack of platelets prior to ICD system extraction. I answered all questions. He may proceed with extraction.  Mikle Bosworth.D.

## 2018-07-16 NOTE — Care Management Important Message (Signed)
Important Message  Patient Details  Name: QUILLAN WHITTER MRN: [id number] Date of Birth: [birth year]   Medicare Important Message Given:  Yes    Cornelio Parkerson 07/16/2018, 3:02 PM

## 2018-07-16 NOTE — Transfer of Care (Signed)
Immediate Anesthesia Transfer of Care Note  Patient: John Davenport  Procedure(s) Performed: ICD LEAD REMOVAL (Left Chest) TRANSESOPHAGEAL ECHOCARDIOGRAM (TEE) (N/A )  Patient Location: PACU  Anesthesia Type:General  Level of Consciousness: awake, alert  and oriented  Airway & Oxygen Therapy: Patient Spontanous Breathing and Patient connected to face mask oxygen  Post-op Assessment: Report given to RN and Post -op Vital signs reviewed and stable  Post vital signs: Reviewed and stable  Last Vitals:  Vitals Value Taken Time  BP 82/62 07/16/2018  4:11 PM  Temp    Pulse 71 07/16/2018  4:14 PM  Resp 13 07/16/2018  4:15 PM  SpO2 99 % 07/16/2018  4:15 PM  Vitals shown include unvalidated device data.  Last Pain:  Vitals:   07/16/18 1211  TempSrc: Oral  PainSc: 0-No pain      Patients Stated Pain Goal: 0 (38/38/18 4037)  Complications: No apparent anesthesia complications

## 2018-07-16 NOTE — Progress Notes (Signed)
Pharmacy Antibiotic Note  John Davenport is a 43 y.o. male admitted on 07/12/2018 with hives and CP and subsequently found to have MSSA bacteremia after leaving AMA. Pt had been started on nafcillin on readmission but will transition to cefazolin.   Scr continues to improve (0.79 today, CrCl >100 mL/min). Afebrile. WBC 17.9. Plan for ICD removal today per EP due to vegetation on pacer wires.   Plan: -Continue cefazolin 2g IV q8h  Height: 6\' 3"  (190.5 cm) Weight: 171 lb 8.3 oz (77.8 kg) IBW/kg (Calculated) : 84.5  Temp (24hrs), Avg:98.3 F (36.8 C), Min:97.6 F (36.4 C), Max:99 F (37.2 C)  Recent Labs  Lab 07/12/18 1839 07/13/18 0220 07/14/18 0301 07/15/18 0218 07/16/18 0210  WBC 9.8 10.9* 15.8* 17.4* 17.9*  CREATININE 2.26* 2.05* 1.16 0.86 0.79    Estimated Creatinine Clearance: 129.7 mL/min (by C-G formula based on SCr of 0.79 mg/dL).    No Known Allergies  Antimicrobials this admission: Vancomycin 4/13 x1 Nafcillin 4/13 >> 4/14 Cefazolin 4/14 >>  Dose adjustments this admission: N/A  Microbiology results: 4/13 MRSA PCR: negative 4/12 BCx: MSSA  Thank you for allowing pharmacy to be a part of this patient's care.  Antonietta Jewel, PharmD, BCCCP Clinical Pharmacist  Pager: [phone] Phone: [phone] Please check AMION for all [place] numbers 07/16/2018

## 2018-07-16 NOTE — Anesthesia Procedure Notes (Signed)
Arterial Line Insertion Start/End4/17/2020 1:20 PM, 07/16/2018 1:30 PM Performed by: Lavell Luster, CRNA, CRNA  Preanesthetic checklist: patient identified, IV checked, site marked, risks and benefits discussed, surgical consent, monitors and equipment checked, pre-op evaluation and timeout performed Lidocaine 1% used for infiltration Right, radial was placed Catheter size: 20 G Hand hygiene performed , maximum sterile barriers used  and Seldinger technique used Allen's test indicative of satisfactory collateral circulation Attempts: 1 Procedure performed without using ultrasound guided technique. Following insertion, Biopatch. Post procedure assessment: normal  Patient tolerated the procedure well with no immediate complications.

## 2018-07-16 NOTE — Anesthesia Procedure Notes (Signed)
Procedure Name: Intubation Date/Time: 07/16/2018 1:59 PM Performed by: Imagene Riches, CRNA Pre-anesthesia Checklist: Patient identified, Emergency Drugs available, Suction available and Patient being monitored Patient Re-evaluated:Patient Re-evaluated prior to induction Oxygen Delivery Method: Circle System Utilized Preoxygenation: Pre-oxygenation with 100% oxygen Induction Type: IV induction Laryngoscope Size: Miller and 3 Grade View: Grade I Tube type: Oral Tube size: 7.5 mm Number of attempts: 1 Airway Equipment and Method: Stylet and Oral airway Placement Confirmation: ETT inserted through vocal cords under direct vision,  positive ETCO2 and breath sounds checked- equal and bilateral Secured at: 22 cm Tube secured with: Tape Dental Injury: Teeth and Oropharynx as per pre-operative assessment

## 2018-07-16 NOTE — Anesthesia Preprocedure Evaluation (Signed)
Anesthesia Evaluation   Patient awake    Reviewed: Allergy & Precautions, NPO status , Patient's Chart, lab work & pertinent test results  Airway Mallampati: II  TM Distance: >3 FB     Dental   Pulmonary sleep apnea , Current Smoker,    breath sounds clear to auscultation       Cardiovascular + CAD, + Past MI and +CHF   Rhythm:Regular Rate:Normal     Neuro/Psych    GI/Hepatic GERD  ,(+) Hepatitis -  Endo/Other    Renal/GU Renal disease     Musculoskeletal   Abdominal   Peds  Hematology   Anesthesia Other Findings   Reproductive/Obstetrics                             Anesthesia Physical Anesthesia Plan  ASA: IV  Anesthesia Plan: General   Post-op Pain Management:    Induction: Intravenous  PONV Risk Score and Plan: 1 and Ondansetron, Midazolam and Treatment may vary due to age or medical condition  Airway Management Planned: Oral ETT  Additional Equipment:   Intra-op Plan:   Post-operative Plan: Possible Post-op intubation/ventilation  Informed Consent: I have reviewed the patients History and Physical, chart, labs and discussed the procedure including the risks, benefits and alternatives for the proposed anesthesia with the patient or authorized representative who has indicated his/her understanding and acceptance.     Dental advisory given  Plan Discussed with: CRNA and Anesthesiologist  Anesthesia Plan Comments:         Anesthesia Quick Evaluation

## 2018-07-17 ENCOUNTER — Inpatient Hospital Stay (HOSPITAL_COMMUNITY): Payer: Medicare Other

## 2018-07-17 LAB — BASIC METABOLIC PANEL
Anion gap: 8 (ref 5–15)
BUN: 19 mg/dL (ref 6–20)
CO2: 26 mmol/L (ref 22–32)
Calcium: 8.2 mg/dL — ABNORMAL LOW (ref 8.9–10.3)
Chloride: 95 mmol/L — ABNORMAL LOW (ref 98–111)
Creatinine, Ser: 0.88 mg/dL (ref 0.61–1.24)
GFR calc Af Amer: >60 mL/min (ref >60)
GFR calc non Af Amer: >60 mL/min (ref >60)
Glucose, Bld: 117 mg/dL — ABNORMAL HIGH (ref 70–99)
Potassium: 4.4 mmol/L (ref 3.5–5.1)
Sodium: 129 mmol/L — ABNORMAL LOW (ref 135–145)

## 2018-07-17 LAB — BPAM PLATELET PHERESIS
Blood Product Expiration Date: 202004182359
ISSUE DATE / TIME: 202004171448
Unit Type and Rh: 6200

## 2018-07-17 LAB — CBC
HCT: 43.5 % (ref 39.0–52.0)
Hemoglobin: 14.6 g/dL (ref 13.0–17.0)
MCH: 31.7 pg (ref 26.0–34.0)
MCHC: 33.6 g/dL (ref 30.0–36.0)
MCV: 94.6 fL (ref 80.0–100.0)
Platelets: 104 10*3/uL — ABNORMAL LOW (ref 150–400)
RBC: 4.6 MIL/uL (ref 4.22–5.81)
RDW: 14.3 % (ref 11.5–15.5)
WBC: 16.9 10*3/uL — ABNORMAL HIGH (ref 4.0–10.5)
nRBC: 0 % (ref 0.0–0.2)

## 2018-07-17 LAB — PREPARE PLATELET PHERESIS: Unit division: 0

## 2018-07-17 MED ORDER — KETOROLAC TROMETHAMINE 30 MG/ML IJ SOLN
30.0000 mg | Freq: Four times a day (QID) | INTRAMUSCULAR | Status: DC | PRN
  Administered 2018-07-17 – 2018-07-20 (×10): 30 mg via INTRAVENOUS
  Filled 2018-07-17 (×10): qty 1

## 2018-07-17 NOTE — Progress Notes (Signed)
   Subjective: Patient reports he is having pain at the surgery site this morning. He continues to feel nauseated as well. All questions and concerns addressed.   Objective:  Vital signs in last 24 hours: Vitals:   07/16/18 2333 07/17/18 0424 07/17/18 0500 07/17/18 0729  BP: 104/71 109/76  118/88  Pulse: 69 75  69  Resp: (!) 23 20  11   Temp: 98.3 F (36.8 C) 97.8 F (36.6 C)  97.7 F (36.5 C)  TempSrc: Oral Oral  Oral  SpO2: 100% 100%  100%  Weight:   80.1 kg   Height:       Physical Exam Gen: seen comfortably laying in bed, no distress CV: RRR, no murmurs rubs or gallops Abdomen: soft, non distended, bowel sounds present, no ttp Skin: right chest surgical site clean and dry, bandage in place  Ext: no edema, warm and well perfused   Assessment/Plan:  Principal Problem:   Bacteremia due to methicillin susceptible Staphylococcus aureus (MSSA) Active Problems:   PAROXYSMAL VENTRICULAR TACHYCARDIA   Tobacco abuse   CKD (chronic kidney disease), stage III (HCC)   Chronic systolic congestive heart failure (HCC)   ICD (implantable cardioverter-defibrillator) infection (HCC)   Opioid use disorder (HCC)   Thrombocytopenia (HCC)   Hepatitis C infection   Hyponatremia  John Davenport is a 43 yo M w/ PMH of CAD s/p CABG,NSTEMi w/ cocain use, Ischemic cardiomyopathy s/p ICD, HFrEF, COPD, 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  MSSA bacteremia:2/2 IVDU.Day 5 of cefazolin.ICD successfully removed yesterday. Will need IV antibiotic therapy for six weeks from today. He has mentioned that he will not be able to stay for a total of 6 weeks because he has 45 year old twins to take care of. Will need to discuss long term plan with ID.  - IDand EPon board, appreciate recommendations - Continue cefazolin -Blood culturessusceptibilitiespan sensitive - Repeat blood cultures today  Opiateuse disorder:  Stable on suboxone. Last used IV heroin 4/12. - Suboxone8 mg Q8H - Zofran prn for nausea - Imodium prn for diarrhea  Hyponatremia, hypochloremia:most likely in the setting of emesis, diarrhea and decrease PO intake. Encourage PO intake.  - Daily BMP  Heart Failure with reduced Ejection Fraction:20% EF.Euvolemic. -Continuelosartan, spironolactone,digoxin, amiodarone, and carvedilol - Continue tohold furosemide for now  - Telemetry - I&Os - Daily weights  Hx of CVA 2/2 cardio-embolic source: -Holdingrivaroxaban,post-op placed on SCD's  Dispo: Patient will need 6 weeks of IV antibiotics, he is high risk due to IVDU and not a good candidate for a PICC line and outpatient therapy.   Mike Craze, DO 07/17/2018, 7:36 AM Pager: [phone]

## 2018-07-17 NOTE — Progress Notes (Signed)
Progress Note  Patient Name: John Davenport Date of Encounter: 07/17/2018  Primary Cardiologist: No primary care provider on file.   Subjective   " my chest is sore". No sob. C/o nausea.  Inpatient Medications    Scheduled Meds: . sodium chloride   Intravenous Once  . amiodarone  200 mg Oral BID  . buprenorphine-naloxone  1 tablet Sublingual Q8H  . carvedilol  6.25 mg Oral BID  . digoxin  125 mcg Oral Daily  . losartan  25 mg Oral Daily  . nicotine  21 mg Transdermal Q2000  . spironolactone  25 mg Oral QHS   Continuous Infusions: .  ceFAZolin (ANCEF) IV Stopped (07/17/18 0554)  . lactated ringers 10 mL/hr at 07/16/18 1257   PRN Meds: acetaminophen, calcium carbonate, diphenhydrAMINE, ketorolac, loperamide, ondansetron (ZOFRAN) IV, ondansetron   Vital Signs    Vitals:   07/17/18 0424 07/17/18 0500 07/17/18 0729 07/17/18 0800  BP: 109/76  118/88   Pulse: 75  69 72  Resp: 20  11 16   Temp: 97.8 F (36.6 C)  97.7 F (36.5 C)   TempSrc: Oral  Oral   SpO2: 100%  100% 99%  Weight:  80.1 kg    Height:        Intake/Output Summary (Last 24 hours) at 07/17/2018 0923 Last data filed at 07/17/2018 0756 Gross per 24 hour  Intake 3041.5 ml  Output 1175 ml  Net 1866.5 ml   Filed Weights   07/16/18 0449 07/16/18 1253 07/17/18 0500  Weight: 77.8 kg 77.6 kg 80.1 kg    Telemetry    nsr - Personally Reviewed  ECG    none - Personally Reviewed  Physical Exam   GEN: No acute distress.   Neck: No JVD Cardiac: RRR, no murmurs, rubs, or gallops.  Respiratory: Clear to auscultation bilaterally. ICD removal incision clean and dry. GI: Soft, nontender, non-distended  MS: No edema; No deformity. Neuro:  Nonfocal  Psych: Normal affect   Labs    Chemistry Recent Labs  Lab 07/11/18 1912  07/12/18 1839  07/15/18 0218 07/16/18 0210 07/17/18 0207  NA  --    < > 129*   < > 127* 129* 129*  K  --    < > 4.0   < > 3.7 4.1 4.4  CL  --    < > 95*   < > 93* 95*  95*  CO2  --    < > 20*   < > 22 28 26   GLUCOSE  --    < > 108*   < > 101* 92 117*  BUN  --    < > 35*   < > 20 19 19   CREATININE  --    < > 2.26*   < > 0.86 0.79 0.88  CALCIUM  --    < > 8.5*   < > 8.2* 8.0* 8.2*  PROT 7.1  --  7.3  --   --   --   --   ALBUMIN 2.9*  --  2.9*  --   --   --   --   AST 42*  --  51*  --   --   --   --   ALT 36  --  40  --   --   --   --   ALKPHOS 65  --  50  --   --   --   --   BILITOT 1.2  --  1.0  --   --   --   --  GFRNONAA  --    < > 34*   < > >60 >60 >60  GFRAA  --    < > 39*   < > >60 >60 >60  ANIONGAP  --    < > 14   < > 12 6 8    < > = values in this interval not displayed.     Hematology Recent Labs  Lab 07/15/18 0218 07/16/18 0210 07/17/18 0207  WBC 17.4* 17.9* 16.9*  RBC 4.69 4.69 4.60  HGB 14.8 14.8 14.6  HCT 42.3 42.9 43.5  MCV 90.2 91.5 94.6  MCH 31.6 31.6 31.7  MCHC 35.0 34.5 33.6  RDW 13.8 14.0 14.3  PLT 75* 55* 104*    Cardiac Enzymes Recent Labs  Lab 07/11/18 1912 07/12/18 0111 07/12/18 0551  TROPONINI <0.03 <0.03 <0.03   No results for input(s): TROPIPOC in the last 168 hours.   BNP Recent Labs  Lab 07/12/18 0109 07/12/18 1839  BNP 876.0* 413.1*     DDimer No results for input(s): DDIMER in the last 168 hours.   Radiology    Dg Chest 2 View  Result Date: 07/17/2018 CLINICAL DATA:  Status post ICD removal. EXAM: CHEST - 2 VIEW COMPARISON:  07/12/2018 FINDINGS: Left-sided ICD generator and leads have been removed. Sequelae of median sternotomy are again identified. The cardiac silhouette is normal in size. There is slight prominence of the interstitial markings in both lung bases without overt edema, airspace opacity, or pneumothorax. Mild blunting of the posterior costophrenic angles is noted with trace pleural effusions not excluded. Subcentimeter nodular density in the right lung base is soda represent a nipple shadow. No acute osseous abnormality is seen. IMPRESSION: Interval ICD removal.  No evidence of  acute airspace disease. Electronically Signed   By: Logan Bores M.D.   On: 07/17/2018 08:12   Dg C-arm 1-60 Min-no Report  Result Date: 07/16/2018 Fluoroscopy was utilized by the requesting physician.  No radiographic interpretation.   Dg C-arm 1-60 Min-no Report  Result Date: 07/16/2018 Fluoroscopy was utilized by the requesting physician.  No radiographic interpretation.    Cardiac Studies   none  Patient Profile     43 y.o. male admitted with staph sepsis and endocarditis on his ICD lead.  Assessment & Plan    1. Staph sepsis/endocarditis - he is s/p extraction of an infected DDD ICD and hemodynamically stable. He will need his stitches removed in 10 days. IV anti-biotics as per his medical team.  2. ICM/VT - he is not a candidate for repeat ICD insertion in the setting of ongoing IV drug use. Continue amiodarone.    For questions or updates, please contact [place] Please consult [email or web address] for contact info under Cardiology/STEMI.   Signed, Cristopher Peru, MD  07/17/2018, 9:23 AM  Patient ID: John Davenport, male   DOB: [birth year], 43 y.o.   MRN: [id number]

## 2018-07-17 NOTE — Progress Notes (Signed)
  Date: 07/17/2018  Patient name: John Davenport  Medical record number: [id number]  Date of birth: 15-Jul-1975   I have seen and evaluated this patient and I have discussed the plan of care with the house staff. Please see their note for complete details. I concur with their findings with the following additions/corrections:   Went for ICD removal yesterday.  Reports feeling well this morning but having significant pain in the left chest where the ICD was removed.  Site looks clean and dry with no significant swelling or bruising.  1.  MSSA bacteremia: - Continue IV cefazolin for 6 weeks from today - Repeat blood cultures today - He has said he will not be able to stay for the full 6 weeks due to needing to care for his children.  We will continue to work on this with him - Some pain at ICD removal site, trying Toradol, if does not respond, can use opioids temporarily, although will need higher doses because he is on Suboxone - We will need to clarify with EP when we can restart anticoagulation  2.  Opioid use disorder: Withdrawal symptoms have resolved - Continue Suboxone 8 mg 3 times daily - We will try to establish him in our OUD clinic after discharge  3.  Hyponatremia: Likely multifactorial from some hypovolemia, GI losses, and possible SIADH.  Received 1.5 L NS over the past couple days, will hold off on further fluids today as he is getting back to a regular diet and recheck tomorrow.  Continue holding furosemide  4.  Thrombocytopenia: Platelet count up today after receiving platelet transfusion yesterday before ICD removal.  Sepsis may have led to his acute drop, will continue to trend  5.  HFrEF: Continuing home medications except for furosemide, will check volume status daily and consider resuming furosemide if he develops hypervolemia  [name], M.D., Ph.D. 07/17/2018, 2:04 PM

## 2018-07-18 LAB — BASIC METABOLIC PANEL
Anion gap: 11 (ref 5–15)
BUN: 20 mg/dL (ref 6–20)
CO2: 20 mmol/L — ABNORMAL LOW (ref 22–32)
Calcium: 7.6 mg/dL — ABNORMAL LOW (ref 8.9–10.3)
Chloride: 98 mmol/L (ref 98–111)
Creatinine, Ser: 0.75 mg/dL (ref 0.61–1.24)
GFR calc Af Amer: >60 mL/min (ref >60)
GFR calc non Af Amer: >60 mL/min (ref >60)
Glucose, Bld: 81 mg/dL (ref 70–99)
Potassium: 4 mmol/L (ref 3.5–5.1)
Sodium: 129 mmol/L — ABNORMAL LOW (ref 135–145)

## 2018-07-18 LAB — TYPE AND SCREEN
ABO/RH(D): O POS
Antibody Screen: NEGATIVE
Unit division: 0
Unit division: 0

## 2018-07-18 LAB — CBC
HCT: 38.7 % — ABNORMAL LOW (ref 39.0–52.0)
Hemoglobin: 12.9 g/dL — ABNORMAL LOW (ref 13.0–17.0)
MCH: 31.8 pg (ref 26.0–34.0)
MCHC: 33.3 g/dL (ref 30.0–36.0)
MCV: 95.3 fL (ref 80.0–100.0)
Platelets: 133 10*3/uL — ABNORMAL LOW (ref 150–400)
RBC: 4.06 MIL/uL — ABNORMAL LOW (ref 4.22–5.81)
RDW: 14.3 % (ref 11.5–15.5)
WBC: 16 10*3/uL — ABNORMAL HIGH (ref 4.0–10.5)
nRBC: 0 % (ref 0.0–0.2)

## 2018-07-18 LAB — BPAM RBC
Blood Product Expiration Date: 202004282359
Blood Product Expiration Date: 202004282359
Unit Type and Rh: 5100
Unit Type and Rh: 5100

## 2018-07-18 MED ORDER — RIVAROXABAN 20 MG PO TABS
20.0000 mg | ORAL_TABLET | Freq: Every day | ORAL | Status: DC
  Administered 2018-07-19: 17:00:00 20 mg via ORAL
  Filled 2018-07-18: qty 1

## 2018-07-18 NOTE — Discharge Summary (Signed)
Name: John Davenport MRN: [id number] DOB: [birth year] 43 y.o. PCP: Patient, No Pcp Per  Date of Admission: 07/12/2018  6:10 PM Date of Discharge: 07/20/2018 Attending Physician: Lucious Groves, DO  Discharge Diagnosis: 1. MSSA bacteremia  2. ICD infection 3. Thrombocytopenia 4. Hepatitis C infection 5. Opiate use disorder  6. Chronic systolic congestive heart failure  7. Hyponatremia    Discharge Medications: Allergies as of 07/20/2018   No Known Allergies     Medication List    STOP taking these medications   potassium chloride SA 20 MEQ tablet Commonly known as:  K-DUR     TAKE these medications   amiodarone 200 MG tablet Commonly known as:  PACERONE Take 1 tablet (200 mg total) by mouth 2 (two) times daily.   carvedilol 6.25 MG tablet Commonly known as:  COREG Take 1 tablet (6.25 mg total) by mouth 2 (two) times daily.   digoxin 0.125 MG tablet Commonly known as:  Lanoxin Take 1 tablet (125 mcg total) by mouth daily.   EPINEPHrine 0.3 mg/0.3 mL Soaj injection Commonly known as:  EPI-PEN Inject 0.3 mLs (0.3 mg total) into the muscle as needed for anaphylaxis.   furosemide 40 MG tablet Commonly known as:  LASIX Take 1 tablet (40 mg total) by mouth 2 (two) times daily.   losartan 25 MG tablet Commonly known as:  COZAAR Take 1 tablet (25 mg total) by mouth 2 (two) times daily.   nitroGLYCERIN 0.4 MG SL tablet Commonly known as:  NITROSTAT Place 1 tablet (0.4 mg total) under the tongue every 5 (five) minutes as needed for chest pain.   Rivaroxaban 15 MG Tabs tablet Commonly known as:  XARELTO Take 1 tablet (15 mg total) by mouth daily with supper for 30 days. Restart on Thursday morning.   spironolactone 25 MG tablet Commonly known as:  ALDACTONE Take 1 tablet (25 mg total) by mouth at bedtime.       Disposition and follow-up:   [name] was discharged from [facility] in Serious condition.  At the hospital  follow up visit please address:  1.  Patient left AMA, he was being treated for MSSA bacteremia and OUD, planned to return the next day after dealing with finding a housing situation for his kids. If patient did not return we would contact him to arrange other options. He will need follow up with ID.   Please f/u on blood cultures, repeat labs.   Hyponatremia: Likely due to emesis, recheck labs.  2.  Labs / imaging needed at time of follow-up: CBC, BMP  3.  Pending labs/ test needing follow-up: None  Follow-up Appointments: Follow-up Information    [facility].   Specialty:  Cardiology Why:  07/26/2018 at 12:00PM (noon), wound care (stitches removal) visit Contact information: [address], [place] [facility] Course by problem list:  1. MSSA bacteremia - Patient initially presented with an urticarial rash and hypotension thought to be secondary to an allergic reaction.  Blood cultures grew MSSA bacteremia secondary to IV drug use.  He was started on IV cefazolin.  Echocardiogram showed vegetations on his ICD and ICD was removed on 4/17.  Infectious disease recommended 6 weeks of IV antibiotics starting after ICD removal.  Repeat blood cultures showed no growth. EP said that this patient would not be a candidate for ICD reimplantation due  to IV drug use.  He had a ICD placed for paroxysmal ventricular tachycardia however is not reliant on the device and no longer was requiring this.  On day 4 of his antibiotic course patient had to take care of housing situation for his children and told us that he had to go, we discussed that it was very unsafe and that we highly recommend him to stay to complete his antibiotic course.  He reported that he wanted to go but that he would return the next day to resume care. Patient left AMA.  2. ICD infection-see above.  3. Thrombocytopenia-likely in the setting  of sepsis.  He received a platelet transfusion on 4/17 and platelet counts improved.  4. Hepatitis C infection-hepatitis C antibody was positive.  RNA was negative, he likely had an infection in the past and cleared it on his own. No further work up at this time.   5. Opiate use disorder -patient reported that he use about 3 g of IV heroin daily.  He was withdrawing and started on Suboxone and responded well to 8 mg every 8 hours. Patient left AMA, planning to return the next day.   6. Chronic systolic congestive heart failure-EF 20%.  He was continued on his home medications of losartan, spironolactone, digoxin, amiodarone and carvedilol.  Furosemide was held due to low pressures, resumed on discharge.  7. Hyponatremia -most likely in the setting of GI losses via emesis diarrhea and decreased p.o. intake.  He was given fluid boluses with little improvement. Na improved a little and was 130 on discharge.   Discharge Vitals:   BP 125/85 (BP Location: Left Arm)   Pulse 70   Temp 97.9 F (36.6 C) (Oral)   Resp 14   Ht 6\' 3"  (1.905 m)   Wt 87.2 kg   SpO2 99%   BMI 24.03 kg/m   Pertinent Labs, Studies, and Procedures:  CBC Latest Ref Rng & Units 07/22/2018 07/21/2018 07/20/2018  WBC 4.0 - 10.5 K/uL 11.0(H) 15.1(H) 12.4(H)  Hemoglobin 13.0 - 17.0 g/dL 11.9(L) 13.4 12.7(L)  Hematocrit 39.0 - 52.0 % 36.5(L) 40.7 39.1  Platelets 150 - 400 K/uL 250 268 214   BMP Latest Ref Rng & Units 07/22/2018 07/21/2018 07/20/2018  Glucose 70 - 99 mg/dL 88 97 103(H)  BUN 6 - 20 mg/dL 10 11 17   Creatinine 0.61 - 1.24 mg/dL 0.90 0.78 0.92  Sodium 135 - 145 mmol/L 133(L) 130(L) 131(L)  Potassium 3.5 - 5.1 mmol/L 4.5 5.1 4.4  Chloride 98 - 111 mmol/L 96(L) 95(L) 96(L)  CO2 22 - 32 mmol/L 30 30 28   Calcium 8.9 - 10.3 mg/dL 8.1(L) 8.5(L) 7.8(L)   Echocardiogram 07/13/18: IMPRESSIONS    1. The left ventricle has severely reduced systolic function, with an ejection fraction of 20-25%. The cavity size was  severely dilated. Left ventricular diastolic Doppler parameters are consistent with impaired relaxation. Left ventricular diffuse  hypokinesis.  2. The right ventricle has normal systolc function. The cavity was normal.  3. Mild thickening of the mitral valve leaflet.  4. The tricuspid valve was grossly normal.  5. The aortic valve is tricuspid Mild thickening of the aortic valve. No stenosis of the aortic valve.  6. Pulmonic valve regurgitation was not assessed by color flow Doppler.  7. Severe global reduction in LV systolic function; severe LVE; mild diastolic dysfunction; oscillating densities on pacer wire concerning for vegetation (largest 1.2 x 0.6 cm).   Discharge Instructions: Discharge Instructions    Call MD  for:  difficulty breathing, headache or visual disturbances   Complete by:  As directed    Call MD for:  extreme fatigue   Complete by:  As directed    Call MD for:  hives   Complete by:  As directed    Call MD for:  persistant dizziness or light-headedness   Complete by:  As directed    Call MD for:  persistant nausea and vomiting   Complete by:  As directed    Call MD for:  redness, tenderness, or signs of infection (pain, swelling, redness, odor or green/yellow discharge around incision site)   Complete by:  As directed    Call MD for:  severe uncontrolled pain   Complete by:  As directed    Call MD for:  temperature >100.4   Complete by:  As directed    Diet - low sodium heart healthy   Complete by:  As directed    Discharge instructions   Complete by:  As directed    You have a life threatening blood stream infection (MSSA Bacteremia) that you were being treated for during this hospitalization. Your ICD was removed during this visit as it was infected with this bacterial infection. YOU ARE CHOOSING TO LEAVE AGAINST MEDICAL ADVICE to take care of housing for your children. We will not be prescribing you any antibiotics or other medications at this time because you  reported that you would be returning tomorrow to resume care. We strongly advise you to return tomorrow or ASAP. You will be contacted as to when to come in. Your risk leaving against medical advise include heart attack, stroke, cardiac arrhythmias, joint infections and death.   Increase activity slowly   Complete by:  As directed       Signed: Asencion Noble, MD 07/21/2018, 6:51 AM   Pager: @[phone]

## 2018-07-18 NOTE — Progress Notes (Signed)
Internal Medicine Attending:   I saw and examined the patient. I reviewed Dr Olevia Perches note and I agree with the resident's findings and plan as documented in the resident's note. No acute issues, does not want to stay in hospital for 6 weeks of IV antibiotics. Will plan to continue IV ancef for now and discuss with ID possible alternatives.  Will continue to hold lasix, monitor fluid status, anticipate hyponatremia will correct slowly. Resume Xarelto tomorrow.  Marland Kitchen

## 2018-07-18 NOTE — Progress Notes (Signed)
   Subjective: Patient reports feeling well this morning. He is still having some soreness in his surgical site, nausea and some abdominal pain. He states he ambulated down the hall yesterday. He continues to say he cannot stay in the hospital for 6 weeks but is trying to stay as long as he is able. All questions and concerns addressed.  Objective:  Vital signs in last 24 hours: Vitals:   07/17/18 1700 07/17/18 1900 07/17/18 2300 07/18/18 0345  BP:  106/67 117/90 129/72  Pulse:  68 61 68  Resp: 18 17 17 17   Temp:  97.8 F (36.6 C) 97.7 F (36.5 C) 97.6 F (36.4 C)  TempSrc:  Oral Oral Oral  SpO2:  98% 98% 98%  Weight:    82.1 kg  Height:       Physical Exam Gen: seen comfortably sitting at the bedside, no distress Pulm: CTA bilaterally, normal effort Ext: no edema, warm and well perfused, no rash or hives    Assessment/Plan:  Principal Problem:   Bacteremia due to methicillin susceptible Staphylococcus aureus (MSSA) Active Problems:   PAROXYSMAL VENTRICULAR TACHYCARDIA   Tobacco abuse   CKD (chronic kidney disease), stage III (HCC)   Chronic systolic congestive heart failure (HCC)   ICD (implantable cardioverter-defibrillator) infection (HCC)   Opioid use disorder (HCC)   Thrombocytopenia (HCC)   Hepatitis C infection   Hyponatremia  John Davenport is a 43 yo M w/ PMH of CAD s/p CABG,NSTEMi w/ cocain use, Ischemic cardiomyopathy s/p ICD, HFrEF, COPD, 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  MSSA bacteremia:2/2 IVDU.ICD successfully removed on 4/17. He has gotten 6 days of cefazolin but will need IV antibiotic therapy for six weeks as of 4/18 s/p ICD removal. He has mentioned that he will not be able to stay for a total of 6 weeks because he has 45 year old twins to take care of. Will need to discuss long term plan with ID.  - IDand EPon board, appreciate recommendations - Continue  cefazolin, day 2 - Repeat blood cultures pending   Opiateuse disorder:Stable on suboxone.Last used IV heroin 4/12. - Suboxone8 mg Q8H - Zofran prn for nausea - Imodium prn for diarrhea  Hyponatremia, hypochloremia:most likely in the setting of emesis, diarrhea and decrease PO intake. Encourage PO intake.  - Daily BMP  Heart Failure with reduced Ejection Fraction:20% EF.Euvolemic. -Continuelosartan, spironolactone,digoxin, amiodarone, and carvedilol - Continue tohold furosemide for now  - Telemetry - I&Os - Daily weights  Hx of CVA 2/2 cardio-embolic source:Discussed with Dr. Lovena Le, okay to resume xarelto tomorrow, 2 days post-op.  -SCDs - Resume Xarelto 15 mg on 4/20   Dispo: Patient will need 6 weeks of IV antibiotics, he is high risk due to IVDU and not a good candidate for a PICC line and outpatient therapy.   Mike Craze, DO 07/18/2018, 6:48 AM Pager: [phone]

## 2018-07-19 ENCOUNTER — Encounter (HOSPITAL_COMMUNITY): Payer: Self-pay | Admitting: Internal Medicine

## 2018-07-19 DIAGNOSIS — R21 Rash and other nonspecific skin eruption: Secondary | ICD-10-CM

## 2018-07-19 DIAGNOSIS — N179 Acute kidney failure, unspecified: Secondary | ICD-10-CM

## 2018-07-19 NOTE — Progress Notes (Signed)
Subjective:  Pt remains restless, frequently pacing around room & earlier in hallway per nursing; continues to complain of pain but less so than days prior...asking when he can go home; medical insight is extremely poor   Antibiotics:  Anti-infectives (From admission, onward)   Start     Dose/Rate Route Frequency Ordered Stop   07/16/18 1145  gentamicin (GARAMYCIN) 80 mg in sodium chloride 0.9 % 500 mL irrigation     80 mg Irrigation To ShortStay Surgical 07/15/18 0934 07/16/18 1400   07/16/18 1030  ceFAZolin (ANCEF) IVPB 2g/100 mL premix  Status:  Discontinued     2 g 200 mL/hr over 30 Minutes Intravenous To ShortStay Surgical 07/15/18 0934 07/16/18 1644   07/13/18 1300  ceFAZolin (ANCEF) IVPB 2g/100 mL premix     2 g 200 mL/hr over 30 Minutes Intravenous Every 8 hours 07/13/18 1253     07/13/18 0000  nafcillin injection 2 g  Status:  Discontinued     2 g Intravenous Every 4 hours 07/12/18 2059 07/12/18 2125   07/13/18 0000  nafcillin 2 g in sodium chloride 0.9 % 100 mL IVPB  Status:  Discontinued     2 g 200 mL/hr over 30 Minutes Intravenous Every 4 hours 07/12/18 2125 07/13/18 1231   07/12/18 1830  vancomycin (VANCOCIN) IVPB 1000 mg/200 mL premix     1,000 mg 200 mL/hr over 60 Minutes Intravenous  Once 07/12/18 1820 07/12/18 1942     Cefazolin, day 7 + 1 day of vanc/nafcillin  Medications: Scheduled Meds:  sodium chloride   Intravenous Once   amiodarone  200 mg Oral BID   buprenorphine-naloxone  1 tablet Sublingual Q8H   carvedilol  6.25 mg Oral BID   digoxin  125 mcg Oral Daily   losartan  25 mg Oral Daily   nicotine  21 mg Transdermal Q2000   rivaroxaban  20 mg Oral Q supper   spironolactone  25 mg Oral QHS   Continuous Infusions:   ceFAZolin (ANCEF) IV 2 g (07/19/18 0622)   lactated ringers 10 mL/hr at 07/16/18 1257   PRN Meds:.acetaminophen, calcium carbonate, diphenhydrAMINE, ketorolac, loperamide, ondansetron (ZOFRAN) IV,  ondansetron    Objective: Weight change: 0.7 kg  Intake/Output Summary (Last 24 hours) at 07/19/2018 1259 Last data filed at 07/19/2018 1250 Gross per 24 hour  Intake 1096 ml  Output 1575 ml  Net -479 ml   Blood pressure 110/60, pulse 70, temperature 98.2 F (36.8 C), temperature source Oral, resp. rate 16, height 6\' 3"  (1.905 m), weight 82.8 kg, SpO2 100 %. Temp:  [97.8 F (36.6 C)-98.2 F (36.8 C)] 98.2 F (36.8 C) (04/20 1226) Pulse Rate:  [67-88] 70 (04/20 1226) Resp:  [12-21] 16 (04/20 0300) BP: (102-123)/(60-89) 110/60 (04/20 0731) SpO2:  [96 %-100 %] 100 % (04/20 1226) Weight:  [82.8 kg] 82.8 kg (04/20 0500)  Physical Exam: General: Alert and awake, oriented x3, less agitated but remains anxious/irritiable at times. HEENT: anicteric sclera, EOMI CVS regular rate, normal, I/VI SEM at LLSB, former ICD site to LT chest wall stitched c/d/i w/o erythema or drainage but slight tenderness  Chest: mild wheezing throughout, CTA bilaterally, no respiratory distress Abdomen: soft non-distended, +BS (hypoactive) Extremities: trace LE edema or deformity noted bilaterally Skin: His urticarial rash is improved/nearly resolved; abrasions to RT chest wall are healing, no [place] lesions or splinter hemorrhages Neuro: nonfocal, gait was not assessed, CN II-XII grossly intact Psychiatric: anxious, pressured speech (asks questions but  rarely waits for answers)  CBC:    Component Value Date/Time   WBC 16.0 (H) 07/18/2018 0636   RBC 4.06 (L) 07/18/2018 0636   HGB 12.9 (L) 07/18/2018 0636   HCT 38.7 (L) 07/18/2018 0636   PLT 133 (L) 07/18/2018 0636   MCV 95.3 07/18/2018 0636   MCH 31.8 07/18/2018 0636   MCHC 33.3 07/18/2018 0636   RDW 14.3 07/18/2018 0636   LYMPHSABS 0.3 (L) 07/12/2018 1839   MONOABS 0.3 07/12/2018 1839   EOSABS 0.0 07/12/2018 1839   BASOSABS 0.0 07/12/2018 1839      BMET Recent Labs    07/17/18 0207 07/18/18 0636  NA 129* 129*  K 4.4 4.0  CL 95* 98   CO2 26 20*  GLUCOSE 117* 81  BUN 19 20  CREATININE 0.88 0.75  CALCIUM 8.2* 7.6*     Liver Panel  No results for input(s): PROT, ALBUMIN, AST, ALT, ALKPHOS, BILITOT, BILIDIR, IBILI in the last 72 hours.     Sedimentation Rate No results for input(s): ESRSEDRATE in the last 72 hours. C-Reactive Protein No results for input(s): CRP in the last 72 hours.  Micro Results: Recent Results (from the past 720 hour(s))  Culture, blood (routine x 2)     Status: Abnormal   Collection Time: 07/11/18 10:45 PM  Result Value Ref Range Status   Specimen Description BLOOD LEFT HAND  Final   Special Requests   Final    BOTTLES DRAWN AEROBIC AND ANAEROBIC Blood Culture results may not be optimal due to an inadequate volume of blood received in culture bottles   Culture  Setup Time   Final    GRAM POSITIVE COCCI IN CLUSTERS IN BOTH AEROBIC AND ANAEROBIC BOTTLES CRITICAL RESULT CALLED TO, READ BACK BY AND VERIFIED WITH: RN [place], [place] 1303 [id number] FCP Performed at [facility], [place] [address], [place], [place] [address]    Culture STAPHYLOCOCCUS AUREUS (A)  Final   Report Status 07/14/2018 FINAL  Final   Organism ID, Bacteria STAPHYLOCOCCUS AUREUS  Final      Susceptibility   Staphylococcus aureus - MIC*    CIPROFLOXACIN <=0.5 SENSITIVE Sensitive     ERYTHROMYCIN <=0.25 SENSITIVE Sensitive     GENTAMICIN <=0.5 SENSITIVE Sensitive     OXACILLIN 0.5 SENSITIVE Sensitive     TETRACYCLINE <=1 SENSITIVE Sensitive     VANCOMYCIN <=0.5 SENSITIVE Sensitive     TRIMETH/SULFA <=10 SENSITIVE Sensitive     CLINDAMYCIN <=0.25 SENSITIVE Sensitive     RIFAMPIN <=0.5 SENSITIVE Sensitive     Inducible Clindamycin NEGATIVE Sensitive     * STAPHYLOCOCCUS AUREUS  Blood Culture ID Panel (Reflexed)     Status: Abnormal   Collection Time: 07/11/18 10:45 PM  Result Value Ref Range Status   Enterococcus species NOT DETECTED NOT DETECTED Final   Listeria monocytogenes NOT DETECTED NOT DETECTED Final    Staphylococcus species DETECTED (A) NOT DETECTED Final    Comment: CRITICAL RESULT CALLED TO, READ BACK BY AND VERIFIED WITH: RN [place], R 1303 [id number] FCP    Staphylococcus aureus (BCID) DETECTED (A) NOT DETECTED Final    Comment: Methicillin (oxacillin) susceptible Staphylococcus aureus (MSSA). Preferred therapy is anti staphylococcal beta lactam antibiotic (Cefazolin or Nafcillin), unless clinically contraindicated. CRITICAL RESULT CALLED TO, READ BACK BY AND VERIFIED WITH: RN [place], R 1303 [id number] FCP    Methicillin resistance NOT DETECTED NOT DETECTED Final   Streptococcus species NOT DETECTED NOT DETECTED Final   Streptococcus agalactiae NOT DETECTED NOT DETECTED  Final   Streptococcus pneumoniae NOT DETECTED NOT DETECTED Final   Streptococcus pyogenes NOT DETECTED NOT DETECTED Final   Acinetobacter baumannii NOT DETECTED NOT DETECTED Final   Enterobacteriaceae species NOT DETECTED NOT DETECTED Final   Enterobacter cloacae complex NOT DETECTED NOT DETECTED Final   Escherichia coli NOT DETECTED NOT DETECTED Final   Klebsiella oxytoca NOT DETECTED NOT DETECTED Final   Klebsiella pneumoniae NOT DETECTED NOT DETECTED Final   Proteus species NOT DETECTED NOT DETECTED Final   Serratia marcescens NOT DETECTED NOT DETECTED Final   Haemophilus influenzae NOT DETECTED NOT DETECTED Final   Neisseria meningitidis NOT DETECTED NOT DETECTED Final   Pseudomonas aeruginosa NOT DETECTED NOT DETECTED Final   Candida albicans NOT DETECTED NOT DETECTED Final   Candida glabrata NOT DETECTED NOT DETECTED Final   Candida krusei NOT DETECTED NOT DETECTED Final   Candida parapsilosis NOT DETECTED NOT DETECTED Final   Candida tropicalis NOT DETECTED NOT DETECTED Final    Comment: Performed at [facility], [place] [address], [place], [place] [address]  Culture, blood (routine x 2)     Status: Abnormal   Collection Time: 07/11/18 10:49 PM  Result Value Ref Range Status   Specimen Description BLOOD  LEFT ARM  Final   Special Requests   Final    BOTTLES DRAWN AEROBIC AND ANAEROBIC Blood Culture results may not be optimal due to an inadequate volume of blood received in culture bottles   Culture  Setup Time   Final    GRAM POSITIVE COCCI IN CLUSTERS IN BOTH AEROBIC AND ANAEROBIC BOTTLES CRITICAL VALUE NOTED.  VALUE IS CONSISTENT WITH PREVIOUSLY REPORTED AND CALLED VALUE.    Culture (A)  Final    STAPHYLOCOCCUS AUREUS SUSCEPTIBILITIES PERFORMED ON PREVIOUS CULTURE WITHIN THE LAST 5 DAYS. Performed at [facility], [place] [address], [place], [place] [address]    Report Status 07/14/2018 FINAL  Final  MRSA PCR Screening     Status: None   Collection Time: 07/12/18 10:48 PM  Result Value Ref Range Status   MRSA by PCR NEGATIVE NEGATIVE Final    Comment:        The GeneXpert MRSA Assay (FDA approved for NASAL specimens only), is one component of a comprehensive MRSA colonization surveillance program. It is not intended to diagnose MRSA infection nor to guide or monitor treatment for MRSA infections. Performed at [facility], [place] [address], [place],  [place] [address]   Surgical pcr screen     Status: None   Collection Time: 07/15/18  3:52 PM  Result Value Ref Range Status   MRSA, PCR NEGATIVE NEGATIVE Final   Staphylococcus aureus NEGATIVE NEGATIVE Final    Comment: (NOTE) The Xpert SA Assay (FDA approved for NASAL specimens in patients 21 years of age and older), is one component of a comprehensive surveillance program. It is not intended to diagnose infection nor to guide or monitor treatment. Performed at [facility], [place] [address] [place], [place] [address]   Culture, blood (routine x 2)     Status: None (Preliminary result)   Collection Time: 07/17/18 10:30 AM  Result Value Ref Range Status   Specimen Description BLOOD RIGHT ARM  Final   Special Requests   Final    BOTTLES DRAWN AEROBIC ONLY Blood Culture results may not be optimal due to an  inadequate volume of blood received in culture bottles   Culture   Final    NO GROWTH 2 DAYS Performed  at [facility], [place] [address] [place], [place] [address]    Report Status PENDING  Incomplete  Culture, blood (routine x 2)     Status: None (Preliminary result)   Collection Time: 07/17/18 10:40 AM  Result Value Ref Range Status   Specimen Description BLOOD RIGHT HAND  Final   Special Requests   Final    BOTTLES DRAWN AEROBIC ONLY Blood Culture adequate volume   Culture   Final    NO GROWTH 2 DAYS Performed at [facility], [place] [address] [place], [place] [address]    Report Status PENDING  Incomplete    Studies/Results: No results found.    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 male with   past medical history significant for coronary artery disease status post coronary bypass grafting, ischemic cardiomyopathy with ICD, COPD, prior embolic stroke, active IV drug use who was admitted with fever, hives, and chest pain subsequently found to have MSSA sepsis/endocarditis and multiple vegetations on his ICD.   1.  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 He will now need 6 weeks of IV antibiotics ideally, which will need to take place in the hospital due to his polysubstance abuse. Due to his co-morbidties, implantation of a new device will be avoided as EP does  NOT feel the patient is device dependent. His continued IV drug use poses a significant risk if a device were ever to be re-implanted as the rates of re-infection would remain elevated significantly. While rifampin would typically be added, he remains on xarelto for his ischemic CM serving as a contra-indication for concurrent rifampin tx. Continue cefazolin 2 gm IV q 8 hrs until 08/27/2018. Repeat blood cxs x 2 following device explantation are unrevealing thus far, suggesting clearance of his BSI. No viable PO ABX option exists for this patient at this time, especially given his recent thrombocytopenia which has only now just begun to recover. Perhaps, once he has completed the first 4 weeks of IV ABX, he may be a candidate for completion of the final 2 weeks of his planned 6 week tx with PO zyvox but this would only be in the instance of no thrombocytopenia remaining at that time as prolonged use of zyvox often exacerbates this issue.  2.  IV drug use: He is getting Suboxone now which seems to be calming him down.  HIV & hepatitis B serologies were both negative; however, HCV Ab is now positive, so f/u HCV RNA viral load with reflex to genotype to assess for recency/chronicity to this possible infection. Pt informed of dx last week. He denies sharing needles but admits to injecting 3 gm of heroin/day for the past 1 year in total. He has never received suboxone as an OP and has never attended any support group or structured rehab/dependency counselling. Recommend case management consult to assist with these issues.   3. Rash - curious urticarial rash was  likely due to his severe staph infection as it is now resolving. Unlikely to be due to his current cefazolin as the rash was present prior to initiation of ABX.    LOS: 7 days   Janine Ores 07/19/2018, 12:59 PM

## 2018-07-19 NOTE — Progress Notes (Signed)
Subjective: He states that his left arm has remained sore but he is trying to move it around. He also reports generalized pains, he reports that he has been walking around a lot which he thinks may have been causing the pains.   He does believe that starting suboxone has completely resolved his cravings. He has not been on suboxone nor methadone in the past. He has also had nausea and abdominal pain with eating or taking medications since admission. He has also been "dry-heaving" everyday, denies any vomiting.   He is adamant that he wants to leave the hospital stating that he has "twins and needs to take care of them."  He reported that he will be staying for now but that he will need to go soon. We discussed that we will discuss with ID to see if we can find a good discharge plan for him, he is in agreement with this.  Objective:  Vital signs in last 24 hours: Vitals:   07/18/18 1800 07/18/18 1900 07/18/18 2300 07/19/18 0300  BP:   114/74 123/89  Pulse:   73 77  Resp: 19 12 16 16   Temp:   97.9 F (36.6 C) 98.2 F (36.8 C)  TempSrc:   Oral Oral  SpO2:   98% 98%  Weight:      Height:        General: Anxious appearing, resting in bed Cardiac: RRR, no m/r/g Pulmonary: CTABL, no wheezing or rhonchi Abdomen: Soft, non-tender, non-distended Extremity: Left shoulder no edema, minimal tenderness on anterior aspect but no point tenderness, no swelling, no warmth  Assessment/Plan:  Principal Problem:   Bacteremia due to methicillin susceptible Staphylococcus aureus (MSSA) Active Problems:   PAROXYSMAL VENTRICULAR TACHYCARDIA   Tobacco abuse   CKD (chronic kidney disease), stage III (HCC)   Chronic systolic congestive heart failure (HCC)   ICD (implantable cardioverter-defibrillator) infection (HCC)   Opioid use disorder (HCC)   Thrombocytopenia (HCC)   Hepatitis C infection   Hyponatremia  This is a 43 year old male with a history of CAD status post CABG, and STEMI with cocaine  use, ischemic cardiomyopathy status post CABG, HFrEF, COPD, embolic CVA on Xarelto, HTN, and tobacco use who presented with a urticarial rash, hypotension, and found to have MSSA bacteremia 2/2 IVDU.   MSSA bacteremia: 2/2 IVDU, had his ICD removed on 07/16/18.  Patient is currently on day 3 of cefazolin, he will need a total of 6 weeks of IV antibiotics.  Patient however reported that he would not be able to stay for the total 6 weeks due to his 96-year-old twins that he needs to take care of.  We will need to follow-up with ID in the next few days to see what options he can have for discharge.  He would like to see his blood cultures negative before considering discharge.  She does have some left ankle pain.  No point tenderness, and he has been having generalized pain does not seem consistent with septic emboli at this time however will continue to monitor closely.  -We will consult ID in the next few days -Continue cefazolin, day 3 -Follow-up blood cultures pending  -Trend fever curve  Opiate use disorder: -Currently on Suboxone, last IV heroin use on 4/12.  He reports that Suboxone has been helping with his cravings and he is feeling good.  He will need to follow-up with [place] in clinic, possibly IM TS clinic. -Suboxone 8 mg every 8 hours -Plan.  For nausea -  Imodium as needed for diarrhea  Hyponatremia, hypochloremia: Possibly due to emesis and diarrhea.  Hyponatremia has been stable at 129, will continue to encourage p.o. intake. -Daily BMP  Heart failure with reduced ejection fraction: 20% EF, euvolemic.  Continue losartan, spironolactone, digoxin, amiodarone, and carvedilol.  Blood pressures have improved, stable around 120s over 80s, will continue to hold Lasix for now and restart if he becomes fluid overloaded. -Telemetry -I's and O's -Daily weights  History of CVA secondary to cardioembolic source: -Holding Xarelto, may restart tomorrow  FEN: No fluids, replete lytes prn, heart  health diet VTE ppx: Alen Blew Code Status: FULL  Dispo: IV antibiotics, however he is not a good candidate for PICC line and is high risk due to his IVDU, will need to consult ID to try to find a good outpatient regimen  Asencion Noble, MD 07/19/2018, 6:20 AM Pager: [phone]

## 2018-07-19 NOTE — Progress Notes (Signed)
Pharmacy Antibiotic Note  John Davenport is a 43 y.o. male admitted on 07/12/2018 with hives and CP and subsequently found to have MSSA bacteremia after leaving AMA. Pt had been started on nafcillin on readmission but will transition to cefazolin.   Scr remains stable at 0.75 (CrCl >100 mL/min). Afebrile. WBC 16 on last check. ICD removal on 4/18 - plan for 6 weeks antibiotics from last date. Most recent blood cx showing no growth to date.   Plan: -Continue cefazolin 2g IV q8h  Height: 6\' 3"  (190.5 cm) Weight: 182 lb 8.7 oz (82.8 kg) IBW/kg (Calculated) : 84.5  Temp (24hrs), Avg:98 F (36.7 C), Min:97.8 F (36.6 C), Max:98.2 F (36.8 C)  Recent Labs  Lab 07/14/18 0301 07/15/18 0218 07/16/18 0210 07/17/18 0207 07/18/18 0636  WBC 15.8* 17.4* 17.9* 16.9* 16.0*  CREATININE 1.16 0.86 0.79 0.88 0.75    Estimated Creatinine Clearance: 138 mL/min (by C-G formula based on SCr of 0.75 mg/dL).    No Known Allergies  Antimicrobials this admission: Vancomycin 4/13 x1 Nafcillin 4/13 >> 4/14 Cefazolin 4/14 >>  Dose adjustments this admission: N/A  Microbiology results: Bcx: 4/18: NGTD 4/13 MRSA PCR: negative 4/12 BCx: MSSA  Thank you for allowing pharmacy to be a part of this patient's care.  Antonietta Jewel, PharmD, [place] Clinical Pharmacist  Pager: [phone] Phone: [phone] Please check AMION for all [facility] numbers 07/19/2018

## 2018-07-20 ENCOUNTER — Encounter: Payer: Medicare Other | Admitting: *Deleted

## 2018-07-20 DIAGNOSIS — Z5329 Procedure and treatment not carried out because of patient's decision for other reasons: Secondary | ICD-10-CM

## 2018-07-20 DIAGNOSIS — I5022 Chronic systolic (congestive) heart failure: Secondary | ICD-10-CM

## 2018-07-20 DIAGNOSIS — B1920 Unspecified viral hepatitis C without hepatic coma: Secondary | ICD-10-CM

## 2018-07-20 LAB — COMPREHENSIVE METABOLIC PANEL
ALT: 14 U/L (ref 0–44)
AST: 19 U/L (ref 15–41)
Albumin: 1.7 g/dL — ABNORMAL LOW (ref 3.5–5.0)
Alkaline Phosphatase: 57 U/L (ref 38–126)
Anion gap: 7 (ref 5–15)
BUN: 17 mg/dL (ref 6–20)
CO2: 28 mmol/L (ref 22–32)
Calcium: 7.8 mg/dL — ABNORMAL LOW (ref 8.9–10.3)
Chloride: 96 mmol/L — ABNORMAL LOW (ref 98–111)
Creatinine, Ser: 0.92 mg/dL (ref 0.61–1.24)
GFR calc Af Amer: >60 mL/min (ref >60)
GFR calc non Af Amer: >60 mL/min (ref >60)
Glucose, Bld: 103 mg/dL — ABNORMAL HIGH (ref 70–99)
Potassium: 4.4 mmol/L (ref 3.5–5.1)
Sodium: 131 mmol/L — ABNORMAL LOW (ref 135–145)
Total Bilirubin: 0.5 mg/dL (ref 0.3–1.2)
Total Protein: 5.5 g/dL — ABNORMAL LOW (ref 6.5–8.1)

## 2018-07-20 LAB — CBC WITH DIFFERENTIAL/PLATELET
Abs Immature Granulocytes: 0.13 10*3/uL — ABNORMAL HIGH (ref 0.00–0.07)
Basophils Absolute: 0 10*3/uL (ref 0.0–0.1)
Basophils Relative: 0 %
Eosinophils Absolute: 0.2 10*3/uL (ref 0.0–0.5)
Eosinophils Relative: 2 %
HCT: 39.1 % (ref 39.0–52.0)
Hemoglobin: 12.7 g/dL — ABNORMAL LOW (ref 13.0–17.0)
Immature Granulocytes: 1 %
Lymphocytes Relative: 19 %
Lymphs Abs: 2.4 10*3/uL (ref 0.7–4.0)
MCH: 30.8 pg (ref 26.0–34.0)
MCHC: 32.5 g/dL (ref 30.0–36.0)
MCV: 94.9 fL (ref 80.0–100.0)
Monocytes Absolute: 1 10*3/uL (ref 0.1–1.0)
Monocytes Relative: 8 %
Neutro Abs: 8.6 10*3/uL — ABNORMAL HIGH (ref 1.7–7.7)
Neutrophils Relative %: 70 %
Platelets: 214 10*3/uL (ref 150–400)
RBC: 4.12 MIL/uL — ABNORMAL LOW (ref 4.22–5.81)
RDW: 14.1 % (ref 11.5–15.5)
WBC: 12.4 10*3/uL — ABNORMAL HIGH (ref 4.0–10.5)
nRBC: 0 % (ref 0.0–0.2)

## 2018-07-20 LAB — HEMOGLOBIN A1C
Hgb A1c MFr Bld: 4.5 % — ABNORMAL LOW (ref 4.8–5.6)
Mean Plasma Glucose: 82.45 mg/dL

## 2018-07-20 NOTE — Care Management Important Message (Signed)
Important Message  Patient Details  Name: John Davenport MRN: [id number] Date of Birth: 12/06/1975   Medicare Important Message Given:  Yes    Hao Dion 07/20/2018, 2:14 PM

## 2018-07-20 NOTE — Progress Notes (Signed)
Subjective: He reports feeling "freaking good." this morning He would like to leave and take care of his kids ASAP. He states that we need to "figure out something today" in regards to his dispo plan. He otherwise does not have any acute complaints. He has not had any cravings related to his IVDU while on suboxone.   Objective:  Vital signs in last 24 hours: Vitals:   07/19/18 1717 07/19/18 1900 07/20/18 0014 07/20/18 0425  BP: 111/75 114/82 117/81 123/84  Pulse: 75 74    Resp:  12 15 14   Temp: 98.6 F (37 C) 98.5 F (36.9 C) 98.3 F (36.8 C) 98.5 F (36.9 C)  TempSrc: Oral Oral Oral Oral  SpO2:  98%    Weight:    87.2 kg  Height:        General: Lying in bed in no acute distress Cardiac: Normal rate, regular rhythm Pulmonary: CTAB, normal WOB Abdomen: Normoactive BSs, non-TTP Psychiatry: Normal mood, behavior, and affect   Assessment/Plan:  Principal Problem:   Bacteremia due to methicillin susceptible Staphylococcus aureus (MSSA) Active Problems:   PAROXYSMAL VENTRICULAR TACHYCARDIA   Tobacco abuse   CKD (chronic kidney disease), stage III (HCC)   Chronic systolic congestive heart failure (HCC)   ICD (implantable cardioverter-defibrillator) infection (HCC)   Opioid use disorder (HCC)   Thrombocytopenia (HCC)   Hepatitis C infection   Hyponatremia   AKI (acute kidney injury) ([place])  This is a 44 year old male with a history of CAD status post CABG, and STEMI with cocaine use, ischemic cardiomyopathy status post CABG, HFrEF, COPD, embolic CVA on Xarelto, HTN, and tobacco use who presented with a urticarial rash, hypotension, and found to have MSSA bacteremia 2/2 IVDU.   MSSA bacteremia: 2/2 IVDU: S/p ICD removal on 07/16/18.  Patient is currently on day 4 of cefazolin, he will need a total of 6 weeks of IV antibiotics.  Patient however reported that he would not be able to stay for the total 6 weeks due to his 25-year-old twins that he needs to take care of. Today he  reports that he needs to go because the people watching his kids will not be able to continue to do this.   Patient denies any specific new areas of pains.  Blood cultures negative to date x2 days. He reports that he really needs to go but that he should be able to get something arranged for his kids and be able to return tomorrow. We offered having social work come in to assist and he reported that he would need to go to ask people himself.  -We will contact ID to discuss possible dispo options -Continue cefazolin, day 4 -Follow-up blood cultures pending  -Trend fever curve  Opiate use disorder: -Currently on Suboxone, last IV heroin use on 4/12.  He reports that Suboxone has been helping with his cravings and he is feeling good.  He will need to follow-up with [place] in clinic, possibly IMTS clinic. -Suboxone 8 mg every 8 hours -Zofran for nausea -Imodium as needed for diarrhea  Hyponatremia, hypochloremia: Possibly due to emesis and diarrhea.  Hyponatremia has improved to 131, will continue to encourage p.o. intake. -Daily BMP  Heart failure with reduced ejection fraction: 20% EF, euvolemic.  Continue losartan, spironolactone, digoxin, amiodarone, and carvedilol.  Blood pressures have improved, stable around 120s/80s, will continue to hold Lasix for now and restart if he becomes fluid overloaded. -Telemetry -I's and O's -Daily weights  History of CVA secondary  to cardioembolic source: -Holding Xarelto, restart today  FEN: No fluids, replete lytes prn, heart health diet VTE ppx: Alen Blew Code Status: FULL  Dispo: Patient reported that he needed to leave today to figure out a housing situation for his kids, patient is leaving AMA but plans to return tomorrow  Asencion Noble, MD 07/20/2018, 6:34 AM Pager: [phone]

## 2018-07-20 NOTE — Progress Notes (Signed)
Pt spoke with dr , I was informed that pt wants to  Leave [facility], to give Antibiotic  Then he can go.. Form signed . Care continues.

## 2018-07-20 NOTE — Discharge Instructions (Signed)
You have a life threatening blood stream infection (MSSA Bacteremia) that you were being treated for during this hospitalization. Your ICD was removed during this visit as it was infected with this bacterial infection. YOU ARE CHOOSING TO LEAVE AGAINST MEDICAL ADVICE to take care of housing for your children. We will not be prescribing you any antibiotics or other medications at this time because you reported that you would be returning tomorrow to resume care. We strongly advise you to return tomorrow or ASAP. We will call you when the bed is ready for you tomorrow. Your risk leaving against medical advise include heart attack, stroke, cardiac arrhythmias, joint infections and death.   Wound care instructions (defibrillator removal site) Keep incision clean and dry, no showering until cleared to at wound check visit. Avoid vigorous activity with left arm Call the office ([phone]) for redness, drainage, swelling, or fever.     PLEASE SIGN UP TO YOUR MY CHART ACCOUNT WHEN YOU GET HOME (if you aren't already).  IN THE CURRENT ENVIRONMENT WITH COVID-19, IN EFFORT TO REDUCE YOUR EXPOSURE WE WILL BE CONDUCTING MANY PATIENT VISITS BY EITHER VIRTUAL/VIDEO VISITS or TELEPHONE VISITS.  BEING SIGNED UP IN YOUR MY CHART ACCOUNT  WILL HELP FACILITATE THESE VISITS AND OUR COMMUNICATION WITH YOU

## 2018-07-21 ENCOUNTER — Telehealth: Payer: Self-pay

## 2018-07-21 ENCOUNTER — Inpatient Hospital Stay (HOSPITAL_COMMUNITY)
Admission: RE | Admit: 2018-07-21 | Discharge: 2018-07-29 | DRG: 897 | Disposition: A | Discharge status: Home / Self Care | Payer: Medicare Other | Attending: Internal Medicine | Admitting: Internal Medicine

## 2018-07-21 DIAGNOSIS — Z79899 Other long term (current) drug therapy: Secondary | ICD-10-CM | POA: Diagnosis not present

## 2018-07-21 DIAGNOSIS — I5042 Chronic combined systolic (congestive) and diastolic (congestive) heart failure: Secondary | ICD-10-CM | POA: Diagnosis present

## 2018-07-21 DIAGNOSIS — Z951 Presence of aortocoronary bypass graft: Secondary | ICD-10-CM | POA: Diagnosis not present

## 2018-07-21 DIAGNOSIS — I255 Ischemic cardiomyopathy: Secondary | ICD-10-CM | POA: Diagnosis present

## 2018-07-21 DIAGNOSIS — I252 Old myocardial infarction: Secondary | ICD-10-CM | POA: Diagnosis not present

## 2018-07-21 DIAGNOSIS — Z720 Tobacco use: Secondary | ICD-10-CM | POA: Diagnosis not present

## 2018-07-21 DIAGNOSIS — Z8673 Personal history of transient ischemic attack (TIA), and cerebral infarction without residual deficits: Secondary | ICD-10-CM

## 2018-07-21 DIAGNOSIS — R233 Spontaneous ecchymoses: Secondary | ICD-10-CM | POA: Diagnosis not present

## 2018-07-21 DIAGNOSIS — R1900 Intra-abdominal and pelvic swelling, mass and lump, unspecified site: Secondary | ICD-10-CM | POA: Diagnosis present

## 2018-07-21 DIAGNOSIS — I2510 Atherosclerotic heart disease of native coronary artery without angina pectoris: Secondary | ICD-10-CM | POA: Diagnosis not present

## 2018-07-21 DIAGNOSIS — R7881 Bacteremia: Secondary | ICD-10-CM | POA: Diagnosis present

## 2018-07-21 DIAGNOSIS — N183 Chronic kidney disease, stage 3 unspecified: Secondary | ICD-10-CM | POA: Diagnosis present

## 2018-07-21 DIAGNOSIS — F1490 Cocaine use, unspecified, uncomplicated: Secondary | ICD-10-CM | POA: Diagnosis present

## 2018-07-21 DIAGNOSIS — M25512 Pain in left shoulder: Secondary | ICD-10-CM | POA: Diagnosis present

## 2018-07-21 DIAGNOSIS — R1909 Other intra-abdominal and pelvic swelling, mass and lump: Secondary | ICD-10-CM | POA: Diagnosis not present

## 2018-07-21 DIAGNOSIS — E875 Hyperkalemia: Secondary | ICD-10-CM | POA: Diagnosis not present

## 2018-07-21 DIAGNOSIS — F1123 Opioid dependence with withdrawal: Principal | ICD-10-CM | POA: Diagnosis present

## 2018-07-21 DIAGNOSIS — I43 Cardiomyopathy in diseases classified elsewhere: Secondary | ICD-10-CM | POA: Diagnosis not present

## 2018-07-21 DIAGNOSIS — K625 Hemorrhage of anus and rectum: Secondary | ICD-10-CM | POA: Diagnosis not present

## 2018-07-21 DIAGNOSIS — I130 Hypertensive heart and chronic kidney disease with heart failure and stage 1 through stage 4 chronic kidney disease, or unspecified chronic kidney disease: Secondary | ICD-10-CM | POA: Diagnosis present

## 2018-07-21 DIAGNOSIS — J449 Chronic obstructive pulmonary disease, unspecified: Secondary | ICD-10-CM | POA: Diagnosis not present

## 2018-07-21 DIAGNOSIS — B1920 Unspecified viral hepatitis C without hepatic coma: Secondary | ICD-10-CM | POA: Diagnosis present

## 2018-07-21 DIAGNOSIS — L89152 Pressure ulcer of sacral region, stage 2: Secondary | ICD-10-CM | POA: Diagnosis present

## 2018-07-21 DIAGNOSIS — E871 Hypo-osmolality and hyponatremia: Secondary | ICD-10-CM | POA: Diagnosis not present

## 2018-07-21 DIAGNOSIS — L509 Urticaria, unspecified: Secondary | ICD-10-CM | POA: Diagnosis not present

## 2018-07-21 DIAGNOSIS — B9561 Methicillin susceptible Staphylococcus aureus infection as the cause of diseases classified elsewhere: Secondary | ICD-10-CM | POA: Diagnosis not present

## 2018-07-21 DIAGNOSIS — F1190 Opioid use, unspecified, uncomplicated: Secondary | ICD-10-CM | POA: Diagnosis not present

## 2018-07-21 DIAGNOSIS — N1831 Chronic kidney disease, stage 3a: Secondary | ICD-10-CM | POA: Diagnosis present

## 2018-07-21 DIAGNOSIS — K5900 Constipation, unspecified: Secondary | ICD-10-CM | POA: Diagnosis not present

## 2018-07-21 DIAGNOSIS — F1121 Opioid dependence, in remission: Secondary | ICD-10-CM | POA: Diagnosis present

## 2018-07-21 DIAGNOSIS — I5020 Unspecified systolic (congestive) heart failure: Secondary | ICD-10-CM | POA: Diagnosis not present

## 2018-07-21 DIAGNOSIS — I110 Hypertensive heart disease with heart failure: Secondary | ICD-10-CM | POA: Diagnosis not present

## 2018-07-21 HISTORY — DX: Personal history of other infectious and parasitic diseases: Z86.19

## 2018-07-21 LAB — CBC
HCT: 40.7 % (ref 39.0–52.0)
Hemoglobin: 13.4 g/dL (ref 13.0–17.0)
MCH: 31.5 pg (ref 26.0–34.0)
MCHC: 32.9 g/dL (ref 30.0–36.0)
MCV: 95.8 fL (ref 80.0–100.0)
Platelets: 268 10*3/uL (ref 150–400)
RBC: 4.25 MIL/uL (ref 4.22–5.81)
RDW: 14 % (ref 11.5–15.5)
WBC: 15.1 10*3/uL — ABNORMAL HIGH (ref 4.0–10.5)
nRBC: 0 % (ref 0.0–0.2)

## 2018-07-21 LAB — BASIC METABOLIC PANEL
Anion gap: 5 (ref 5–15)
BUN: 11 mg/dL (ref 6–20)
CO2: 30 mmol/L (ref 22–32)
Calcium: 8.5 mg/dL — ABNORMAL LOW (ref 8.9–10.3)
Chloride: 95 mmol/L — ABNORMAL LOW (ref 98–111)
Creatinine, Ser: 0.78 mg/dL (ref 0.61–1.24)
GFR calc Af Amer: >60 mL/min (ref >60)
GFR calc non Af Amer: >60 mL/min (ref >60)
Glucose, Bld: 97 mg/dL (ref 70–99)
Potassium: 5.1 mmol/L (ref 3.5–5.1)
Sodium: 130 mmol/L — ABNORMAL LOW (ref 135–145)

## 2018-07-21 LAB — HCV RNA QUANT: HCV Quantitative: NOT DETECTED IU/mL (ref >50)

## 2018-07-21 LAB — HEPATITIS C GENOTYPE

## 2018-07-21 MED ORDER — SPIRONOLACTONE 25 MG PO TABS: 25.0000 mg | ORAL_TABLET | Freq: Every day | ORAL | Status: DC

## 2018-07-21 MED ORDER — ACETAMINOPHEN 325 MG PO TABS
650.0000 mg | ORAL_TABLET | Freq: Four times a day (QID) | ORAL | Status: DC | PRN
  Administered 2018-07-21 – 2018-07-28 (×2): 650 mg via ORAL
  Filled 2018-07-21 (×2): qty 2

## 2018-07-21 MED ORDER — LOSARTAN POTASSIUM 25 MG PO TABS: 25.0000 mg | ORAL_TABLET | Freq: Two times a day (BID) | ORAL | Status: DC

## 2018-07-21 MED ORDER — AMIODARONE HCL 200 MG PO TABS
200.0000 mg | ORAL_TABLET | Freq: Two times a day (BID) | ORAL | Status: DC
  Administered 2018-07-21 – 2018-07-29 (×16): 200 mg via ORAL
  Filled 2018-07-21 (×16): qty 1

## 2018-07-21 MED ORDER — ENOXAPARIN SODIUM 40 MG/0.4ML ~~LOC~~ SOLN
40.0000 mg | SUBCUTANEOUS | Status: DC
  Administered 2018-07-22 – 2018-07-23 (×2): 40 mg via SUBCUTANEOUS
  Filled 2018-07-21 (×3): qty 0.4

## 2018-07-21 MED ORDER — BUPRENORPHINE HCL-NALOXONE HCL 8-2 MG SL SUBL
1.0000 | SUBLINGUAL_TABLET | Freq: Three times a day (TID) | SUBLINGUAL | Status: DC
  Administered 2018-07-21 – 2018-07-29 (×25): 1 via SUBLINGUAL
  Filled 2018-07-21 (×25): qty 1

## 2018-07-21 MED ORDER — KETOROLAC TROMETHAMINE 30 MG/ML IJ SOLN
15.0000 mg | INTRAMUSCULAR | Status: AC | PRN
  Administered 2018-07-21 – 2018-07-26 (×17): 15 mg via INTRAVENOUS
  Filled 2018-07-21 (×17): qty 1

## 2018-07-21 MED ORDER — ONDANSETRON HCL 4 MG/2ML IJ SOLN
4.0000 mg | Freq: Three times a day (TID) | INTRAMUSCULAR | Status: DC | PRN
  Administered 2018-07-21 – 2018-07-22 (×2): 4 mg via INTRAVENOUS
  Filled 2018-07-21 (×2): qty 2

## 2018-07-21 MED ORDER — ACETAMINOPHEN 650 MG RE SUPP: 650.0000 mg | Freq: Four times a day (QID) | RECTAL | Status: DC | PRN

## 2018-07-21 MED ORDER — CARVEDILOL 6.25 MG PO TABS: 6.2500 mg | ORAL_TABLET | Freq: Two times a day (BID) | ORAL | Status: DC

## 2018-07-21 MED ORDER — ONDANSETRON 4 MG PO TBDP: 4.0000 mg | ORAL_TABLET | Freq: Three times a day (TID) | ORAL | Status: DC | PRN

## 2018-07-21 MED ORDER — CEFAZOLIN SODIUM-DEXTROSE 2-4 GM/100ML-% IV SOLN
2.0000 g | Freq: Three times a day (TID) | INTRAVENOUS | Status: DC
  Administered 2018-07-21 – 2018-07-29 (×25): 2 g via INTRAVENOUS
  Filled 2018-07-21 (×26): qty 100

## 2018-07-21 MED ORDER — PROMETHAZINE HCL 25 MG PO TABS
12.5000 mg | ORAL_TABLET | Freq: Four times a day (QID) | ORAL | Status: DC | PRN
  Administered 2018-07-21: 12.5 mg via ORAL
  Filled 2018-07-21: qty 1

## 2018-07-21 MED ORDER — SENNOSIDES-DOCUSATE SODIUM 8.6-50 MG PO TABS
1.0000 | ORAL_TABLET | Freq: Every evening | ORAL | Status: DC | PRN
  Administered 2018-07-24: 1 via ORAL
  Filled 2018-07-21: qty 1

## 2018-07-21 MED ORDER — SODIUM CHLORIDE 0.9 % IV SOLN
INTRAVENOUS | Status: DC | PRN
  Administered 2018-07-21 – 2018-07-28 (×4): 500 mL via INTRAVENOUS

## 2018-07-21 MED ORDER — PROMETHAZINE HCL 25 MG PO TABS: 12.5000 mg | ORAL_TABLET | ORAL | Status: DC | PRN

## 2018-07-21 MED ORDER — DIGOXIN 125 MCG PO TABS
125.0000 ug | ORAL_TABLET | Freq: Every day | ORAL | Status: DC
  Administered 2018-07-22 – 2018-07-29 (×8): 125 ug via ORAL
  Filled 2018-07-21 (×8): qty 1

## 2018-07-21 MED ORDER — PROMETHAZINE HCL 25 MG/ML IJ SOLN: 6.2500 mg | INTRAMUSCULAR | Status: DC | PRN

## 2018-07-21 NOTE — Telephone Encounter (Signed)
Spoke with patient to remind of missed remote transmission 

## 2018-07-21 NOTE — H&P (Signed)
Date: 07/21/2018               Patient Name:  John Davenport MRN: [id number]  DOB: [birth year] Age / Sex: 43 y.o., male   PCP: Patient, No Pcp Per         Medical Service: Internal Medicine Teaching Service         Attending Physician: Dr. Lucious Groves, DO    First Contact: Dr. Sherry Ruffing Pager: [phone]  Second Contact: Dr. 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 Patient left AMA to take care of a social situation involving his twin children.  During that admission he presented with urticarial rash, vomiting and hypotension,found to have MSSA bacteremia. He had a TTE that showed oscillating densities on pace wire concerning for vegetations.  Had his ICD removed on 07/16/2018. ID was consulted and he was started on a 6 weeks course of IV antibiotics, he has completed a total of 4 of antibiotics (cefazolin) and his blood cultures drawn on 07/17/18 have shown no growth to date.  He is now returning for continued treatment of his MSSA bacteremia and opiate use disorder.   Today he reports that he is feeling awful, he has been having a lot of nausea and vomiting since last night and had vomited about 1 hour ago. He was requesting to be restarted on his suboxone. He reports generalized pain and significant pain in his left upper shoulder where his procedure was done.   He also complains of an episode of blood around his rectal area when he was trying to go to the bathroom. He reports that this has occurred every now and then for a least a year however  he was unable to give a specific time frame. He has never had this checked out or see a GI doctor. He does take aspirin daily but denies any other NSAID use. He denies any light headedness, dizziness, fatigue, or generalized weakness.   Meds:  No outpatient medications have been marked as taking for the 07/21/18 encounter [facility] Encounter).     Allergies: Allergies as of 07/20/2018  . (No Known Allergies)   Past Medical History:  Diagnosis Date  . Anxiety state, unspecified   . Back pain   . Cancer ([place])    Lung CA  . Chronic systolic heart failure ([place])    a. Ischemic CM (EF 30% by echo in 2011) b. echo 08/2015: EF 20% with severe HK and AK of the inferoseptal and apical myocardium.  . Coronary artery disease    a. 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  b. eventually required single vessel CABG (L-LAD) in 03/2009 due to restenosis. c. cath 2013 showed patent LIMA sequential to the diags with LAD filling retrograde, no new disease in RCA and Cx, low LVEDP, sx felt noncardiac. d. 08/2015: NSTEMI: CTO of prox LAD, 99% stenosis of small dCx (no stent), + for cocaine  . Esophageal reflux   . Heart attack ([place])   .  Hypercholesteremia   . ICD (implantable cardioverter-defibrillator) infection ([place])    a. [place] [employer] ICD 12/2008.   . Ischemic cardiomyopathy    echocardiogram 12/11: Mild LVH, EF 30-35%, anteroseptal and apical akinesis, grade 1 diastolic dysfunction  . LBP (low back pain)   . OSA (obstructive sleep apnea) 12/30/2010  . Paroxysmal ventricular tachycardia ([place])   . Stroke ([place])   . Tobacco abuse     Family History: Father had early cardiac disease in 32s, no other significant medical issues.   Social History: Currently on disability, twins at home now being taken care of. He has a 30 year smoking history, smokes 1ppd. History of marijuana use and daily heroin use (he denies any recent heroin use).   Review of Systems: A  complete ROS was negative except as per HPI.   Physical Exam: Blood pressure 108/68, pulse 69, temperature 97.8 F (36.6 C), temperature source Oral, resp. rate 16, SpO2 99 %. Physical Exam  Constitutional: He is oriented to person, place, and time and well-developed, well-nourished, and in no distress.  HENT:  Head: Normocephalic and atraumatic.  Eyes: Pupils are equal, round, and reactive to light. Conjunctivae and EOM are normal.  Neck: Normal range of motion. Neck supple. No thyromegaly present.  Cardiovascular: Normal rate, regular rhythm and normal heart sounds.  No murmur heard. Pulmonary/Chest: Effort normal and breath sounds normal. No respiratory distress.  Abdominal: Soft. He exhibits mass (midline soft moveable mass measuring around 3 cm in diameter, non-tender). He exhibits no distension. There is no abdominal tenderness. There is no rebound.  Genitourinary:    Genitourinary Comments: No evidence of hemorrhoids or masses, no bright red blood, small erythema and rash above gluteal cleft   Musculoskeletal: Normal range of motion.        General: Tenderness (over surgical wound on left upper chest, no erythema or drainage) present. No edema.  Neurological: He is alert and oriented to person, place, and time.  Skin: Skin is warm. No rash noted. He is diaphoretic. No pallor.  Psychiatric: Mood and affect normal.     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 Patient left AMA to take care of a social situation involving his twin children. Presenting today for continuation of his treatment. He appears to have gone into withdrawal from suboxone and is now complaining of bright red  blood when he was using the bathroom.    MSSA bacteremia: On last admission he was found to have MSSA bacteremia 2/2 IVDU, he also was noted to have vegetations on echocardiogram and he ICD was removed. Patient was being treated with a 6 week course of antibiotics, had completed 4 days and needed to leave to take care of a housing situation for his children, however reported that he would return the following day (today) to resume care. Today he reports that he does not have any new complaints, he is afebrile and blood pressures have been on the softer side.  His blood cultures in 4/17 show no growth x4.  Exam has been relatively unremarkable, no murmurs are appreciated.  We will continue his IV antibiotics and repeat blood cultures. -Continue cefazolin, day 5 now ( missed last nights dose and this mornings  dose) -Repeat blood cultures -CBC and BMP  OUD: -Patient has a history of daily heroin use and was started on suboxane on his last admission. He reports that he is having a lot of nausea and vomiting, and feeling generally unwell. He was requesting to be restarted on his suboxane. -Restart suboxane 8 mg TID -Zofran PRN for nausea -Imodium PRN for diarrhea -UDS  Bright red blood around rectal area: -Patient reports that that has been going on for years, he noted this when he was wiping his rectal area after straining to go to the bathroom.  Denies any symptoms as lightheadedness, dizziness, headaches, weakness, or abdominal pain.  He does take aspirin daily but denies any other NSAID use.  He was found to have soft blood pressures to 100/60.  On exam he was noted to have a small lesion above the gluteal cleft, no bright red blood in the rectal vault or masses were noted.  I was concerned about an active GI bleed and thinks that it is more related to his small lesion.  We will continue to monitor his blood pressure and check his CBC. -Check CBC -Monitor vitals  History of CVA 2/2 embolic source  -Continue home xeralto  HFrEF: -EF of 20%. He is on lasix 20 mg daily, digoxin, amiodarone, losartan, spironolactone at home. He has some lower blood pressures today.  -Continue home digoxin -Hold other home medications for now -Telemetry -Daily weights -Strict I+Os  FEN: No fluids, replete lytes prn, regular diet VTE ppx: Lovenox  Code Status: FULL    Dispo: Admit patient to Inpatient with expected length of stay greater than 2 midnights.  Signed: Asencion Noble, MD 07/21/2018, 3:44 PM  Pager: [phone]

## 2018-07-21 NOTE — Progress Notes (Signed)
Pharmacy Antibiotic Note  John Davenport is a 43 y.o. 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 Pt left AMA 4/21 and now re-admitted. Pharmacy consulted to dose ancef (end date 08/27/18 per ID note) -WBC= 12.4, afeb -SCr= 0.92 and CrCl > 100   Plan: -Cefazolin 2g IV q8h -Will follow renal function, cultures and clinical progress      Temp (24hrs), Avg:97.7 F (36.5 C), Min:97.6 F (36.4 C), Max:97.8 F (36.6 C)  Recent Labs  Lab 07/15/18 0218 07/16/18 0210 07/17/18 0207 07/18/18 0636 07/20/18 0228  WBC 17.4* 17.9* 16.9* 16.0* 12.4*  CREATININE 0.86 0.79 0.88 0.75 0.92    Estimated Creatinine Clearance: 122.5 mL/min (by C-G formula based on SCr of 0.92 mg/dL).    No Known Allergies  Antimicrobials this admission: Vancomycin 4/13 x1 Nafcillin 4/13 >> 4/14 Cefazolin 4/14 >>  Dose adjustments this admission: N/A  Microbiology results: 4/18 Bcx: NGTD 4/13 MRSA PCR: negative 4/12 BCx: MSSA  Thank you for allowing pharmacy to be a part of this patient's care.  Hildred Laser, PharmD Clinical Pharmacist **Pharmacist phone directory can now be found on [email or web address] ([email or web address]).  Listed under [place].

## 2018-07-21 NOTE — H&P (Signed)
ICD Criteria  Current LVEF:20%. Within 12 months prior to implant: Yes   Heart failure history: Yes, Class II  Cardiomyopathy history: Yes, Ischemic Cardiomyopathy - Prior MI.  Atrial Fibrillation/Atrial Flutter: No.  Ventricular tachycardia history: No.  Cardiac arrest history: No.  History of syndromes with risk of sudden death: No.  Previous ICD: Yes, Reason for ICD:  Secondary prevention.  Current ICD indication: Secondary  PPM indication: No.  Class I or II Bradycardia indication present: No  Beta Blocker therapy for 3 or more months: No, medical reason.  Ace Inhibitor/ARB therapy for 3 or more months: No, medical reason.

## 2018-07-22 ENCOUNTER — Encounter (HOSPITAL_COMMUNITY): Payer: Self-pay | Admitting: Internal Medicine

## 2018-07-22 DIAGNOSIS — F1190 Opioid use, unspecified, uncomplicated: Secondary | ICD-10-CM

## 2018-07-22 DIAGNOSIS — I110 Hypertensive heart disease with heart failure: Secondary | ICD-10-CM

## 2018-07-22 DIAGNOSIS — Z79899 Other long term (current) drug therapy: Secondary | ICD-10-CM

## 2018-07-22 DIAGNOSIS — R7881 Bacteremia: Secondary | ICD-10-CM

## 2018-07-22 DIAGNOSIS — L89152 Pressure ulcer of sacral region, stage 2: Secondary | ICD-10-CM | POA: Diagnosis present

## 2018-07-22 DIAGNOSIS — I43 Cardiomyopathy in diseases classified elsewhere: Secondary | ICD-10-CM

## 2018-07-22 DIAGNOSIS — I252 Old myocardial infarction: Secondary | ICD-10-CM

## 2018-07-22 DIAGNOSIS — Z720 Tobacco use: Secondary | ICD-10-CM

## 2018-07-22 DIAGNOSIS — R1909 Other intra-abdominal and pelvic swelling, mass and lump: Secondary | ICD-10-CM

## 2018-07-22 DIAGNOSIS — Z8673 Personal history of transient ischemic attack (TIA), and cerebral infarction without residual deficits: Secondary | ICD-10-CM

## 2018-07-22 DIAGNOSIS — I2510 Atherosclerotic heart disease of native coronary artery without angina pectoris: Secondary | ICD-10-CM

## 2018-07-22 DIAGNOSIS — Z951 Presence of aortocoronary bypass graft: Secondary | ICD-10-CM

## 2018-07-22 DIAGNOSIS — Z7901 Long term (current) use of anticoagulants: Secondary | ICD-10-CM

## 2018-07-22 DIAGNOSIS — Z8619 Personal history of other infectious and parasitic diseases: Secondary | ICD-10-CM

## 2018-07-22 DIAGNOSIS — B9561 Methicillin susceptible Staphylococcus aureus infection as the cause of diseases classified elsewhere: Secondary | ICD-10-CM

## 2018-07-22 DIAGNOSIS — K625 Hemorrhage of anus and rectum: Secondary | ICD-10-CM

## 2018-07-22 DIAGNOSIS — E871 Hypo-osmolality and hyponatremia: Secondary | ICD-10-CM

## 2018-07-22 DIAGNOSIS — J449 Chronic obstructive pulmonary disease, unspecified: Secondary | ICD-10-CM

## 2018-07-22 DIAGNOSIS — I5020 Unspecified systolic (congestive) heart failure: Secondary | ICD-10-CM

## 2018-07-22 LAB — CULTURE, BLOOD (ROUTINE X 2)
Culture: NO GROWTH
Culture: NO GROWTH
Special Requests: ADEQUATE

## 2018-07-22 LAB — CBC
HCT: 36.5 % — ABNORMAL LOW (ref 39.0–52.0)
Hemoglobin: 11.9 g/dL — ABNORMAL LOW (ref 13.0–17.0)
MCH: 31.2 pg (ref 26.0–34.0)
MCHC: 32.6 g/dL (ref 30.0–36.0)
MCV: 95.8 fL (ref 80.0–100.0)
Platelets: 250 10*3/uL (ref 150–400)
RBC: 3.81 MIL/uL — ABNORMAL LOW (ref 4.22–5.81)
RDW: 13.9 % (ref 11.5–15.5)
WBC: 11 10*3/uL — ABNORMAL HIGH (ref 4.0–10.5)
nRBC: 0 % (ref 0.0–0.2)

## 2018-07-22 LAB — BASIC METABOLIC PANEL
Anion gap: 7 (ref 5–15)
BUN: 10 mg/dL (ref 6–20)
CO2: 30 mmol/L (ref 22–32)
Calcium: 8.1 mg/dL — ABNORMAL LOW (ref 8.9–10.3)
Chloride: 96 mmol/L — ABNORMAL LOW (ref 98–111)
Creatinine, Ser: 0.9 mg/dL (ref 0.61–1.24)
GFR calc Af Amer: >60 mL/min (ref >60)
GFR calc non Af Amer: >60 mL/min (ref >60)
Glucose, Bld: 88 mg/dL (ref 70–99)
Potassium: 4.5 mmol/L (ref 3.5–5.1)
Sodium: 133 mmol/L — ABNORMAL LOW (ref 135–145)

## 2018-07-22 MED ORDER — ONDANSETRON 4 MG PO TBDP
4.0000 mg | ORAL_TABLET | Freq: Four times a day (QID) | ORAL | Status: DC | PRN
  Filled 2018-07-22: qty 1

## 2018-07-22 MED ORDER — ONDANSETRON HCL 4 MG/2ML IJ SOLN
4.0000 mg | Freq: Four times a day (QID) | INTRAMUSCULAR | Status: DC | PRN
  Administered 2018-07-22 – 2018-07-29 (×23): 4 mg via INTRAVENOUS
  Filled 2018-07-22 (×26): qty 2

## 2018-07-22 NOTE — Progress Notes (Addendum)
Subjective: Patient reports that he is not feeling well, no acute events overnight. He reports he is still having pain in his left shoulder over where his wound is. He reported that the [facility] has helped and he feels better and has less cravings now. He still has not had a bowel movement. He reported that his best friend is looking after his kids now and that he should be able to stay here until the end.   Objective:  Vital signs in last 24 hours: Vitals:   07/21/18 1449 07/21/18 1527 07/21/18 2053 07/22/18 0501  BP: 104/65 108/68 98/62 107/64  Pulse: 74 69 67 (!) 57  Resp: 18 16 17 18   Temp: 97.6 F (36.4 C) 97.8 F (36.6 C) 98.1 F (36.7 C) 98.3 F (36.8 C)  TempSrc: Oral Oral Oral Oral  SpO2: 100% 99% 99% 99%  Weight:   80.5 kg     General: Laying in bed, anxious appearing Cardiac: RRR, no m/r/g Pulmonary: CTABL, no wheezing or rhonchi Abdomen: Soft, non-tender, non-distended, small mobile mass in middle of abdomen, non tender Extremity: Left upper chest had surgical wound, mild erythema where sutures are, no significant eryhtema, no drainage, no warmth Psychiatry: Agitated appearing, no sweating    Assessment/Plan:  Active Problems:   MSSA bacteremia  This is a 43 year old male history of CAD s/p CABG NSTEMI cocaine use, ischemic cardiomyopathy status post ICD, HFrEF, COPD, 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 Patient left AMA to take care of a social situation involving his twin children. Presenting today for continuation of his treatment. He appears to have gone into withdrawal from suboxone and is now complaining of bright red blood when he was using the bathroom  MSSA bacteremia: On last admission he was found to have MSSA bacteremia 2/2 IVDU, he also was noted to have vegetations on echocardiogram and he ICD was removed. Patient was being  treated with a 6 week course of antibiotics, had completed 4 days prior to leaving, 4/22 was day 5. He had his ICD removed on 4/17. Blood cultures drawn 4/18 showed NGTD x4 days.  Will need to continue a 6-week course from the start date on 4/17, when his device was removed.  We will continue to monitor for symptoms needed to his bacteremia vegetations. -Continue Ancef, day 6 today, will need 6 week course -Daily CBC for now, will decrease frequency if stable -F/u blood cultures.   OUD Patient reports good control of his cravings. He appears a little agitated and tired today and reports some nausea and vomiting, he may have some opioid withdrawal from missing a few doses.  -Continue Suboxone 8 mg TID -Zofran PRN -Imodium PRN  -UDS ordered, had not been collected yet  Abdominal mass -Soft, non-tender, moveable. POCUS showed a small possible open abdominal opening, no fluid or abcess was seen. Possibly a small abdominal hernia however was not reducible. Will continue to monitor for now.   Hyponatremia: -Noted on last admission, discharge Na was 130, today it is 133. Will continue to monitor and encourage PO intake.  -Daily BMP  HFrEF: -EF of 20%. He is on lasix 20 mg daily, digoxin, amiodarone, losartan, spironolactone at home. He has some lower blood pressures today.  -Continue home digoxin -Hold other home medications for now -Telemetry -Daily weights -Strict I+Os  History of Hep C: On last visit HCV Ab was >11, Hep  C viral load was not detected. He most likely had a previous infection, high risk due to his IVDU, that he cleared on his own. Will continue to monitor for now.   History of CVA 2/2 embolic source -Continue home xeralto  BRB around rectal area: -Patient has not had a bowel movement, he has a small lesions above his gleteal cleft which is likely the source. Hgb has been stable and BP have been a little soft but stable. Will continue to monitor.   FEN: No fluids, replete  lytes prn, regular diet  VTE ppx: Lovenox  Code Status: FULL    Dispo: Anticipated discharge is pending completion of antibiotics.   Asencion Noble, MD 07/22/2018, 6:49 AM Pager: [phone]

## 2018-07-22 NOTE — Consult Note (Signed)
   [facility] CM Inpatient Consult   07/22/2018  DAIEL STROHECKER September 04, 1975 [id number]   Patient screened for unplanned readmissions and hospitalizations less than 7 days with Medicare and to check if potential [facility] services. Currently, patient does not have a primary care provider listed.  Patient was hospitalized for treatment from ongoing MSSA infection and had left against medical advise.  Will follow up with inpatient [facility] team regarding any [facility] Care Management needs, if a primary care provider is found. A message was left for [facility] team member.  Please place a [facility] Care Management consult or for questions contact:   Natividad Brood, RN BSN [facility] Liaison  [phone] business mobile phone Toll free office [phone]

## 2018-07-23 ENCOUNTER — Other Ambulatory Visit: Payer: Self-pay

## 2018-07-23 ENCOUNTER — Encounter (HOSPITAL_COMMUNITY): Payer: Self-pay | Admitting: *Deleted

## 2018-07-23 NOTE — Care Management Important Message (Signed)
Important Message  Patient Details  Name: John Davenport MRN: [id number] Date of Birth: June 26, 1975   Medicare Important Message Given:  Yes    Hendricks Schwandt 07/23/2018, 2:12 PM

## 2018-07-23 NOTE — Progress Notes (Signed)
Subjective: John Davenport reports that he is doing better today, no acute events overnight. He reports that he has not had any new issues today. We discussed the plan for today, all questions and concerns were answered.   Objective:  Vital signs in last 24 hours: Vitals:   07/22/18 1700 07/22/18 2101 07/23/18 0139 07/23/18 0505  BP: (!) 99/59 101/65  118/79  Pulse: 66 71  64  Resp: 18 18  18   Temp: 99.4 F (37.4 C) 98 F (36.7 C)  97.6 F (36.4 C)  TempSrc: Oral Oral  Oral  SpO2: 98% 98%  99%  Weight:   80.5 kg   Height:   6\' 3"  (1.905 m)     General: Well appearing male, NAD, resting in bed Cardiac: RRR, no m/r/g Pulmonary:  CTABL, no wheezing or rhonchi Abdomen: soft, non-tender, midline mass unchanged Extremity: No LE edema  Assessment/Plan:  Principal Problem:   MSSA bacteremia Active Problems:   Chronic combined systolic and diastolic CHF (congestive heart failure) (HCC)   CKD (chronic kidney disease), stage III (HCC)   Opioid use disorder, severe, in early remission (HCC)   Pressure ulcer of sacral region, stage 2 [facility])  This is a 43 year old male history of CAD s/p CABG NSTEMI cocaine use, ischemic cardiomyopathy status post ICD, HFrEF, COPD, 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 Patient left AMA to take care of a social situation involving his twin children.Presenting today for continuation of his treatment. He appears to have gone into withdrawal from suboxone and is now complaining of bright red blood when he was using the bathroom  MSSA bacteremia: On last admission he was found to have MSSA bacteremia 2/2 IVDU, he also was noted to have vegetations on echocardiogram and he ICD was removed.Patient was being treated with a 6 week course of antibiotics, had completed 4 days prior to leaving, 4/22 was day 5. He had his ICD removed on 4/17. Blood  cultures drawn 4/18 showed NGTD x4 days.  Will need to continue a 6-week course from the start date on 4/17, when his device was removed.  We will continue to monitor for symptoms related to his bacteremia vegetations.He has been afebrile, HD stable.  -Continue Ancef  (stop date 5/29) -Blood cultures 4/22 showed NGTD x<24 hr -Weekly CBC  OUD Patient reports good control of his cravings. He appears less agitated today and calm. No new issues today. Will continue to monitor.  -Continue Suboxone 8 mg TID -Zofran PRN -Imodium PRN  -UDS ordered, had not been collected yet  Abdominal mass -Soft, non-tender, moveable. POCUS showed a small possible open abdominal opening, no fluid or abcess was seen. Possibly a small abdominal hernia however was not reducible. Will continue to monitor for now.   Hyponatremia: -Noted on last admission, discharge Na was 130, improved to 133. Will continue to monitor and encourage PO intake.  HFrEF: -EF of 20%.He is on lasix 20 mg daily, digoxin, amiodarone, losartan, spironolactone at home.He has some lower blood pressures today. -Continue homedigoxin -Hold other home medications for now -Telemetry -Daily weights -Strict I+Os  History of Hep C: On last visit HCV Ab was >11, Hep C viral load was not detected. He most likely had a previous infection, high risk due to his IVDU, that he cleared on his own. Will continue to monitor for now.   History of CVA 2/2 embolic source -  Continue home xeralto  FEN: No fluids, replete lytes prn, regular diet  VTE ppx: Lovenox  Code Status: FULL    Dispo: Anticipated discharge is pending completion of antibiotics.    Asencion Noble, MD 07/23/2018, 6:55 AM Pager: [phone]

## 2018-07-24 MED ORDER — NICOTINE 21 MG/24HR TD PT24
21.0000 mg | MEDICATED_PATCH | Freq: Every day | TRANSDERMAL | Status: DC
  Administered 2018-07-24 – 2018-07-29 (×6): 21 mg via TRANSDERMAL
  Filled 2018-07-24 (×6): qty 1

## 2018-07-24 MED ORDER — RIVAROXABAN 20 MG PO TABS
20.0000 mg | ORAL_TABLET | Freq: Every day | ORAL | Status: DC
  Administered 2018-07-24 – 2018-07-28 (×5): 20 mg via ORAL
  Filled 2018-07-24 (×7): qty 1

## 2018-07-24 NOTE — Progress Notes (Signed)
John Davenport has been caught smoking in his room twice. He says he doesn't have anymore and is requesting a nicotine patch. Patient was instructed of hospital policy and the necessity to comply. Paged covering float on call pager, [phone] to inform of patient's recent behavior and request. Awaiting a response. Will continue to monitor for patient safety.

## 2018-07-24 NOTE — Progress Notes (Signed)
Subjective: [name] says that he feels nauseous but the nausea medication is helping him. He states that he is feeling more comfortable and is feeling okay today. He denies any significant pain. He denies any new complaints today. Discussed the plan and he is in agreement.    Objective:  Vital signs in last 24 hours: Vitals:   07/23/18 0505 07/23/18 0934 07/23/18 2111 07/24/18 0504  BP: 118/79 (!) 99/58 120/71 112/69  Pulse: 64 66 72 65  Resp: 18 18 18 18   Temp: 97.6 F (36.4 C) 98.1 F (36.7 C) 98.3 F (36.8 C) 97.8 F (36.6 C)  TempSrc: Oral Oral Oral Oral  SpO2: 99% 98% 100% 100%  Weight:      Height:        General: Well appearing male, NAD, resting in bed Cardiac: RRR, no m/r/g Pulmonary:  CTABL, no wheezing or rhonchi Abdomen: soft, non-tender, midline mass unchanged Extremity: No LE edema  Assessment/Plan:  Principal Problem:   MSSA bacteremia Active Problems:   Chronic combined systolic and diastolic CHF (congestive heart failure) (HCC)   CKD (chronic kidney disease), stage III (HCC)   Opioid use disorder, severe, in early remission (HCC)   Pressure ulcer of sacral region, stage 2 [facility])  This is a 43 year old male history of CAD s/p CABG NSTEMI cocaine use, ischemic cardiomyopathy status post ICD, HFrEF, COPD, 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 Patient left AMA to take care of a social situation involving his twin children.Presenting today for continuation of his treatment. He appears to have gone into withdrawal from suboxone and is now complaining of bright red blood when he was using the bathroom  MSSA bacteremia 2/2 IVDU s/p ICD removal: He had his ICD removed on 4/17. Blood cultures drawn 4/18 showed NGTD x4 days. Treating with ancef, 6 week course, stop date 5/29. He reports doing well, remains afebrile and HD stable. No further  imaging at this time, he denies any specific pains right now.  -Continue ancef (stop day 5/29) -Blood cultures 4/22 NGTD -Weekly CBC  OUD Patient reports feeling well, cravings well controlled. He denies any issues today. He appears very well Patient reports good control of his cravings. He appears less agitated today and calm. No new issues today. Will continue to monitor.  -Continue Suboxone 8 mg TID -Zofran PRN -Imodium PRN  -UDS ordered, had not been collected yet  Abdominal mass -Exam unchanged, non-tender. Possibly a hernia. No further workup.   Hyponatremia: -Noted on last admission, discharge Na was 130, improved to 133. Will continue to monitor and encourage PO intake. -Weekly BMP  HFrEF: -EF of 20%.He is on lasix 20 mg daily, digoxin, amiodarone, losartan, spironolactone at home.He has some lower blood pressures today. -Continue homedigoxin -Hold other home medications for now -Telemetry -Daily weights -Strict I+Os  History of Hep C: On last visit HCV Ab was >11, Hep C viral load was not detected. He most likely had a previous infection, high risk due to his IVDU, that he cleared on his own. Will continue to monitor for now.   History of CVA 2/2 embolic source Hx of LV thrombus -Continue home xeralto  FEN: No fluids, replete lytes prn, regular diet  VTE ppx: Lovenox  Code Status: FULL    Dispo: Anticipated discharge is pending completion of antibiotics.    Asencion Noble, MD 07/24/2018, 6:53 AM Pager: [phone]

## 2018-07-24 NOTE — Progress Notes (Signed)
Pt concerned about being away from twin daughters who are 7 due to IV ABX until May 29. Pt stated their mother got killed in car accident 2 years ago and he is all they have. He stated his mother is still alive and lives in [place] and is helping right now.   Eleanora Neighbor, RN

## 2018-07-24 NOTE — Progress Notes (Signed)
[name] for Xarelto Indication: h/o embolic stroke  No Known Allergies  Patient Measurements: Height: 6\' 3"  (190.5 cm) Weight: 177 lb 7.5 oz (80.5 kg) IBW/kg (Calculated) : 84.5  Vital Signs: Temp: 98.3 F (36.8 C) (04/25 0913) Temp Source: Oral (04/25 0913) BP: 109/74 (04/25 0913) Pulse Rate: 65 (04/25 0913)  Labs: Recent Labs    07/21/18 1533 07/22/18 0420  HGB 13.4 11.9*  HCT 40.7 36.5*  PLT 268 250  CREATININE 0.78 0.90    Estimated Creatinine Clearance: 119.3 mL/min (by C-G formula based on SCr of 0.9 mg/dL).   Medical History: Past Medical History:  Diagnosis Date  . Anxiety state, unspecified   . Back pain   . Cancer ([place])    Lung CA  . Chronic systolic heart failure ([place])    a. Ischemic CM (EF 30% by echo in 2011) b. echo 08/2015: EF 20% with severe HK and AK of the inferoseptal and apical myocardium.  . Coronary artery disease    a. 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  b. eventually required single vessel CABG (L-LAD) in 03/2009 due to restenosis. c. cath 2013 showed patent LIMA sequential to the diags with LAD filling retrograde, no new disease in RCA and Cx, low LVEDP, sx felt noncardiac. d. 08/2015: NSTEMI: CTO of prox LAD, 99% stenosis of small dCx (no stent), + for cocaine  . Esophageal reflux   . Heart attack ([place])   . History of hepatitis C virus infection 07/15/2018   07/20/18 Viral RNA negative suggest past, cleared infection  . Hypercholesteremia   . ICD (implantable cardioverter-defibrillator) infection ([place])    a. [employer] ICD 12/2008.   . Ischemic cardiomyopathy    echocardiogram 12/11: Mild LVH, EF 30-35%, anteroseptal and apical akinesis, grade 1 diastolic dysfunction  . LBP (low back pain)   . OSA (obstructive sleep apnea) 12/30/2010  . Paroxysmal ventricular tachycardia ([place])   . Stroke ([place])   . Tobacco abuse     Medications:  Scheduled:  .  amiodarone  200 mg Oral BID  . buprenorphine-naloxone  1 tablet Sublingual Q8H  . digoxin  125 mcg Oral Daily  . enoxaparin (LOVENOX) injection  40 mg Subcutaneous Q24H  . nicotine  21 mg Transdermal Daily    Assessment: John Davenport with PMH significant for emoblic stroke on Xarelto PTA. Pharmacy has been consulted to restart Xarelto. CBC down slightly from today, but remains within normal limits. Appears patient was discharged on Xarelto 15 mg PO qPM with supper from his last admission due to AKI. Pt's renal function has recovered, so will resume Xarelto at increased dose. Of note, pt was on Lovenox for DVT ppx, but has yet to receive his dose today.  Goal of Therapy: Therapeutic anticogulation   Plan:  Resume Xarelto 20 mg PO qPM with supper Stop Lovenox 40 mg subQ daily Monitor renal function, weekly CBC, s/sx of bleeding Pharmacy will sign off and follow peripherally as patient's Xarelto dose is unlikely to change.  Thank you for allowing pharmacy to be part of this patient's care.  John Davenport, PharmD PGY1 Pharmacy Resident Phone [phone] 07/24/2018     12:10 PM

## 2018-07-25 DIAGNOSIS — K5900 Constipation, unspecified: Secondary | ICD-10-CM

## 2018-07-25 DIAGNOSIS — E875 Hyperkalemia: Secondary | ICD-10-CM

## 2018-07-25 LAB — CBC WITH DIFFERENTIAL/PLATELET
Abs Immature Granulocytes: 0.03 10*3/uL (ref 0.00–0.07)
Basophils Absolute: 0.1 10*3/uL (ref 0.0–0.1)
Basophils Relative: 1 %
Eosinophils Absolute: 0.2 10*3/uL (ref 0.0–0.5)
Eosinophils Relative: 3 %
HCT: 34.6 % — ABNORMAL LOW (ref 39.0–52.0)
Hemoglobin: 10.9 g/dL — ABNORMAL LOW (ref 13.0–17.0)
Immature Granulocytes: 0 %
Lymphocytes Relative: 30 %
Lymphs Abs: 2.9 10*3/uL (ref 0.7–4.0)
MCH: 30.8 pg (ref 26.0–34.0)
MCHC: 31.5 g/dL (ref 30.0–36.0)
MCV: 97.7 fL (ref 80.0–100.0)
Monocytes Absolute: 1.1 10*3/uL — ABNORMAL HIGH (ref 0.1–1.0)
Monocytes Relative: 11 %
Neutro Abs: 5.3 10*3/uL (ref 1.7–7.7)
Neutrophils Relative %: 55 %
Platelets: 244 10*3/uL (ref 150–400)
RBC: 3.54 MIL/uL — ABNORMAL LOW (ref 4.22–5.81)
RDW: 13.9 % (ref 11.5–15.5)
WBC: 9.6 10*3/uL (ref 4.0–10.5)
nRBC: 0 % (ref 0.0–0.2)

## 2018-07-25 LAB — BASIC METABOLIC PANEL
Anion gap: 7 (ref 5–15)
BUN: 11 mg/dL (ref 6–20)
CO2: 31 mmol/L (ref 22–32)
Calcium: 8.5 mg/dL — ABNORMAL LOW (ref 8.9–10.3)
Chloride: 96 mmol/L — ABNORMAL LOW (ref 98–111)
Creatinine, Ser: 1.04 mg/dL (ref 0.61–1.24)
GFR calc Af Amer: >60 mL/min (ref >60)
GFR calc non Af Amer: >60 mL/min (ref >60)
Glucose, Bld: 96 mg/dL (ref 70–99)
Potassium: 5.3 mmol/L — ABNORMAL HIGH (ref 3.5–5.1)
Sodium: 134 mmol/L — ABNORMAL LOW (ref 135–145)

## 2018-07-25 LAB — SEDIMENTATION RATE: Sed Rate: 68 mm/hr — ABNORMAL HIGH (ref 0–16)

## 2018-07-25 LAB — C-REACTIVE PROTEIN: CRP: 2 mg/dL — ABNORMAL HIGH (ref <1.0)

## 2018-07-25 MED ORDER — LOSARTAN POTASSIUM 25 MG PO TABS
25.0000 mg | ORAL_TABLET | Freq: Two times a day (BID) | ORAL | Status: DC
  Administered 2018-07-25 – 2018-07-29 (×8): 25 mg via ORAL
  Filled 2018-07-25 (×8): qty 1

## 2018-07-25 MED ORDER — FUROSEMIDE 40 MG PO TABS
40.0000 mg | ORAL_TABLET | Freq: Two times a day (BID) | ORAL | Status: DC
  Administered 2018-07-25 – 2018-07-27 (×5): 40 mg via ORAL
  Filled 2018-07-25 (×5): qty 1

## 2018-07-25 MED ORDER — SPIRONOLACTONE 25 MG PO TABS: 25.0000 mg | ORAL_TABLET | Freq: Every day | ORAL | Status: DC

## 2018-07-25 MED ORDER — POLYETHYLENE GLYCOL 3350 17 G PO PACK
17.0000 g | PACK | Freq: Every day | ORAL | Status: DC
  Administered 2018-07-25 – 2018-07-27 (×3): 17 g via ORAL
  Filled 2018-07-25 (×5): qty 1

## 2018-07-25 MED ORDER — SENNA 8.6 MG PO TABS
1.0000 | ORAL_TABLET | Freq: Two times a day (BID) | ORAL | Status: DC
  Administered 2018-07-25 – 2018-07-29 (×9): 8.6 mg via ORAL
  Filled 2018-07-25 (×9): qty 1

## 2018-07-25 NOTE — Discharge Summary (Addendum)
Name: John Davenport MRN: [id number] DOB: [birth year] 43 y.o. PCP: Patient, No Pcp Per  Date of Admission: 07/21/2018  2:35 PM Date of Discharge: 07/29/2018 Attending Physician: [name] MD   Discharge Diagnosis: 1.  MSSA bacteremia 2/2 IVDU s/p ICD removal 2.  OUD 3.  Hyponatremia, hyperkalemia 4. Hx of HFrEF 5. Hx of Hep C 6. Hx of CVA 2/2 embolic source, LV thrombus  Discharge Medications: Allergies as of 07/29/2018   No Known Allergies     Medication List    TAKE these medications   amiodarone 200 MG tablet Commonly known as:  PACERONE Take 1 tablet (200 mg total) by mouth 2 (two) times daily for 30 days.   buprenorphine-naloxone 8-2 mg Subl SL tablet Commonly known as:  SUBOXONE Place 1 tablet under the tongue every 8 (eight) hours for 2 days.   carvedilol 6.25 MG tablet Commonly known as:  COREG Take 1 tablet (6.25 mg total) by mouth 2 (two) times daily for 30 days.   digoxin 0.125 MG tablet Commonly known as:  Lanoxin Take 1 tablet (125 mcg total) by mouth daily for 30 days.   EPINEPHrine 0.3 mg/0.3 mL Soaj injection Commonly known as:  EPI-PEN Inject 0.3 mLs (0.3 mg total) into the muscle as needed for anaphylaxis.   furosemide 40 MG tablet Commonly known as:  LASIX Take 1 tablet (40 mg total) by mouth 2 (two) times daily for 30 days.   losartan 25 MG tablet Commonly known as:  COZAAR Take 1 tablet (25 mg total) by mouth 2 (two) times daily.   nitroGLYCERIN 0.4 MG SL tablet Commonly known as:  NITROSTAT Place 1 tablet (0.4 mg total) under the tongue every 5 (five) minutes as needed for up to 30 days for chest pain.   ondansetron 4 MG disintegrating tablet Commonly known as:  ZOFRAN-ODT Take 1 tablet (4 mg total) by mouth every 6 (six) hours as needed for nausea or vomiting.   Rivaroxaban 15 MG Tabs tablet Commonly known as:  XARELTO Take 1 tablet (15 mg total) by mouth daily with supper for 30 days. Restart on Thursday morning.    spironolactone 25 MG tablet Commonly known as:  ALDACTONE Take 1 tablet (25 mg total) by mouth at bedtime.       Disposition and follow-up:   [name] was discharged from [facility] in stable condition.  At the hospital follow up visit please address:  1.  MSSA bacteremia- patient had to leave the hospital for family matters, he was advised to stay in the hospital. He planned to return the following day after taking his kids to their grandmothers funeral/cremation. He will need to follow up with ID  OUD-  Patient was given a 3 day supply of suboxone but will need to follow with a clinic   2.  Labs / imaging needed at time of follow-up: none  3.  Pending labs/ test needing follow-up: none  Follow-up Appointments:   Hospital Course by problem list: 1.  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 Patient left AMA to take care of a social situation involving his twin children.  Patient returned the following day to resume care.  On his previous admission  he was noted to have MSSA bacteremia that was likely due to IV drug use, TTE showed oscillating densities on the pace wire that was concerning for vegetations. Patient had his ICD removed on 4/17.  Blood cultures on 4/18 showed no growth.  We obtained repeat blood cultures on 4/22 that remain negative.  We continued his Ancef. Plan was to continue IV antibiotics until 5/29 however patient had to leave to take his children to their grandmother's funeral. He was given one dose of IV rocephin 2 g before discharge and recommended to return in 24 hours to resume IV antibiotics without interruption.  2.  OUD: Patient had been started on  Suboxone during his prior admission, he was resumed on Suboxone 8 mg 3 times daily he was admitted and Zofran and Imodium for as needed.  He reported good response to the suboxone and reported no cravings.  3.  Hyponatremia, hyperkalemia: Na of 133 on admission, improved and stabilized.  4. Hx of HFrEF: Patient had an EF of 20%. He is on lasix 40 mg BID, digoxin 125 mcg daily, amiodarone 200 mg BID, losartan 25 mg BID, carvedilol 6.25 mg BID and spironolactone 25 mg daily at home.  Blood pressures were borderline soft when he was admitted, we resumed all of his medications.   5. Hx of Hep C: Patient was noted on his last admission with a HCV AB >11, Hep C viral load was not detected, suspect that he had an infection is able to clear on exam.  No further work-up.   6. Hx of CVA 2/2 embolic source, LV thrombus: Continued him on his home Xarelto.  Discharge Vitals:   BP 99/63 (BP Location: Left Arm)   Pulse (!) 59   Temp 98.2 F (36.8 C) (Oral)   Resp 20   Ht 6\' 3"  (1.905 m)   Wt 80.7 kg   SpO2 (!) 84%   BMI 22.24 kg/m   Pertinent Labs, Studies, and Procedures:   CBC Latest Ref Rng & Units 07/25/2018 07/22/2018 07/21/2018  WBC 4.0 - 10.5 K/uL 9.6 11.0(H) 15.1(H)  Hemoglobin 13.0 - 17.0 g/dL 10.9(L) 11.9(L) 13.4  Hematocrit 39.0 - 52.0 % 34.6(L) 36.5(L) 40.7  Platelets 150 - 400 K/uL 244 250 268   CMP Latest Ref Rng & Units 07/29/2018 07/26/2018 07/25/2018  Glucose 70 - 99 mg/dL 86 86 96  BUN 6 - 20 mg/dL 13 12 11   Creatinine 0.61 - 1.24 mg/dL 1.20 1.14 1.04  Sodium 135 - 145 mmol/L 136 133(L) 134(L)  Potassium 3.5 - 5.1 mmol/L 4.8 4.5 5.3(H)  Chloride 98 - 111 mmol/L 97(L) 94(L) 96(L)  CO2 22 - 32 mmol/L 31 31 31   Calcium 8.9 - 10.3 mg/dL 8.7(L) 8.1(L) 8.5(L)  Total Protein 6.5 - 8.1 g/dL - - -  Total Bilirubin 0.3 - 1.2 mg/dL - - -  Alkaline Phos 38 - 126 U/L - - -  AST 15 - 41 U/L - - -  ALT 0 - 44 U/L - - -    Discharge Instructions: Discharge Instructions    Diet - low  sodium heart healthy   Complete by:  As directed    Increase activity slowly   Complete by:  As directed      Mr. John, Davenport were hospitalized due to an infection in your blood stream. You are going to need a total of 6 weeks of IV antibiotics. I understand you need to leave for family matters but it is really important that  you return in 24 hours to continue antibiotic therapy. I have given you a dose of IV antibiotics right now that will last 24 hours so please make sure to come back tomorrow, about 24 hours after your discharge.  I have sent in all of your medications including Suboxone to [facility] on [address]. Thanks for allowing [place] to be a part of your care, we will plan on seeing you back here tomorrow.   SignedMike Craze, DO 07/31/2018, 5:24 PM

## 2018-07-25 NOTE — Progress Notes (Signed)
Subjective: Mr. Lebleu reports that he is feeling better today. No acute events overnight. He reports that he is having constipation and that it's been about 3 days since his last bowel movement. He feels like the suboxone is working well. Discussed the plan, all questions and concerns were answered.   Objective:  Vital signs in last 24 hours: Vitals:   07/24/18 1657 07/24/18 2119 07/25/18 0405 07/25/18 0500  BP: 120/76 111/71 116/76   Pulse: 72 68 65   Resp: 18 18 18    Temp: 98.8 F (37.1 C) 98.2 F (36.8 C) (!) 97.5 F (36.4 C)   TempSrc: Oral Oral Oral   SpO2: 100% 100% (!) 89%   Weight:    80.5 kg  Height:        General: Well appearing, NAD, laying in bed Cardiac: RRR, no m/r/g Pulmonary: Minimal bibasilar crackles, no wheezing, normal work of breathing Abdomen: Soft, non-tended, small abdominal midline mass Extremity: No LE edema Psychiatry: Normal mood and affect    Assessment/Plan:  Principal Problem:   MSSA bacteremia Active Problems:   Chronic combined systolic and diastolic CHF (congestive heart failure) (HCC)   CKD (chronic kidney disease), stage III (HCC)   Opioid use disorder, severe, in early remission (HCC)   Pressure ulcer of sacral region, stage 2 ([place])  This is a 43 year old male history of CAD s/p CABG NSTEMI cocaine use, ischemic cardiomyopathy status post ICD, HFrEF, COPD, 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 Patient left AMA to take care of a social situation involving his twin children.Presenting today for continuation of his treatment. He appears to have gone into withdrawal from suboxone and is now complaining of bright red blood when he was using the bathroom  MSSA bacteremia 2/2 IVDU s/p ICD removal: -ICD removed 4/17. Blood cultures on 4/18 showed NGTD x4 days. Continuing Ancef, on a 6 week course, stop date 5/29. He  reports that he is doing well today, no new complaints of joint issues. Remains afebriles, leukocytosis improved down to 9.6.   -Continue ancef TID (stop date 5/29) -Blood cultures 4/22 showed NGTD -Weekly CBC (last was 4/26)  OUD: He reports that his cravings are gone, he feels that the current dose is working well. Vitals have been stable and he appears more comfortable on exam today.   -Suboxone 8 mg TID -Zofran PRN for nausea -Imodium PRN for diarrhea  Hyponatremia: Hyperkalemia: -Na was 122, today it has normalized to 137. Likely due to emesis and poor intake. K today was elevated to 5.3. Will hold off on starting spironolactone.  -Encourage PO intake -Repeat BMP in 1-2 days  HFrEF: -EF of 20%. He is on lasix 40 mg BID, digoxin 125 mcg daily, amiodarone 200 mg BID, losartan 25 mg BID, carvedilol 6.25 mg BID and spironolactone 25 mg daily at home. Today he appears mostly euvolemic on exam except for some bibasilar crackles. Bps were borderline soft when he came in so most of his home medications were held. Today his BP has still been soft but has been stable. Will restart some home medications as tolerated.  -Continue digoxin 125 mcg -Restarting lasix 40 mg BID -Restarting losartan 25 mg BID -Can resume  -Daily weights -Strict I-Os  History of Hep C: On last visit HCV Ab was >11, Hep C viral load was not detected. He most likely had a previous infection, high risk due  to his IVDU, that he cleared on his own.  -No further work up  History of CVA 2/2 embolic source Hx of LV thrombus -Continue home xeralto  FEN: No fluids, replete lytes prn, regular diet  VTE ppx: Lovenox  Code Status: FULL    Dispo: Anticipated discharge is pending completion of IV antibiotics (last date 5/29).   Asencion Noble, MD 07/25/2018, 6:45 AM Pager: [phone]

## 2018-07-25 NOTE — Progress Notes (Addendum)
  Date: 07/25/2018  Patient name: John Davenport  Medical record number: [id number]  Date of birth: 1975-08-05   I have seen and evaluated this patient and I have discussed the plan of care with the house staff. Please see their note for complete details. I concur with their findings with the following additions/corrections:   Doing well today, primary complaint is constipation as well as some mild ongoing pain at his ICD removal site.  He has some mild edema at the site but no erythema, induration, or drainage.  We will continue IV cefazolin, Suboxone, and increase his bowel regimen.  His JVP is elevated today to the angle of the mandible, but he is breathing normally with only mild bibasilar crackles and no lower extremity edema.  We will resume his home diuretic regimen and trend his volume status and weights and urine output.  [name], M.D., Ph.D. 07/25/2018, 2:07 PM

## 2018-07-25 NOTE — Progress Notes (Signed)
BP is 96/64, P66. Messaged on call to see if want to hold Losartan.    Eleanora Neighbor, RN

## 2018-07-26 ENCOUNTER — Ambulatory Visit: Payer: Medicare Other

## 2018-07-26 LAB — BASIC METABOLIC PANEL
Anion gap: 8 (ref 5–15)
BUN: 12 mg/dL (ref 6–20)
CO2: 31 mmol/L (ref 22–32)
Calcium: 8.1 mg/dL — ABNORMAL LOW (ref 8.9–10.3)
Chloride: 94 mmol/L — ABNORMAL LOW (ref 98–111)
Creatinine, Ser: 1.14 mg/dL (ref 0.61–1.24)
GFR calc Af Amer: >60 mL/min (ref >60)
GFR calc non Af Amer: >60 mL/min (ref >60)
Glucose, Bld: 86 mg/dL (ref 70–99)
Potassium: 4.5 mmol/L (ref 3.5–5.1)
Sodium: 133 mmol/L — ABNORMAL LOW (ref 135–145)

## 2018-07-26 LAB — CULTURE, BLOOD (ROUTINE X 2)
Culture: NO GROWTH
Culture: NO GROWTH
Special Requests: ADEQUATE
Special Requests: ADEQUATE

## 2018-07-26 MED ORDER — CARVEDILOL 6.25 MG PO TABS
6.2500 mg | ORAL_TABLET | Freq: Two times a day (BID) | ORAL | Status: DC
  Administered 2018-07-26 – 2018-07-29 (×7): 6.25 mg via ORAL
  Filled 2018-07-26 (×7): qty 1

## 2018-07-26 NOTE — Progress Notes (Signed)
   Subjective: No overnight events. Patient reports he had a BM yesterday. States it was still hard. Denies any pain, n/v. He states his incision site is itching. All questions and concerns addressed.   Objective:  Vital signs in last 24 hours: Vitals:   07/25/18 1020 07/25/18 1651 07/25/18 2116 07/26/18 0523  BP: 118/65 132/85 96/64 115/71  Pulse: 66 68 66 64  Resp:  18 18 18   Temp: 98 F (36.7 C) 98 F (36.7 C) 97.7 F (36.5 C) 98.2 F (36.8 C)  TempSrc: Oral Oral Oral Oral  SpO2:  98% 99% 98%  Weight:      Height:       Physical Exam Gen: seen comfortably laying in bed, no distress CV: RRR, no murmurs  Skin: incision site clean and dry, some fluid around the site, no tenderness or erythema   Assessment/Plan:  Principal Problem:   MSSA bacteremia Active Problems:   Chronic combined systolic and diastolic CHF (congestive heart failure) (HCC)   CKD (chronic kidney disease), stage III (HCC)   Opioid use disorder, severe, in early remission (HCC)   Pressure ulcer of sacral region, stage 2 [facility])  John Davenport is a 43 year old male history of CAD s/p CABG NSTEMI cocaine use, ischemic cardiomyopathy status post ICD, HFrEF, COPD, 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 Patient left AMA to take care of a social situation involving his twin children.Returned to [facility] for continuation of his treatment.  MSSA bacteremia 2/2 IVDU s/p ICD removal: ICD removed 4/17. Blood cultures on 4/18 and repeat BC on 4/22 showed NGTD x4 days. Continuing IV cefazolin for s 6 week course, stop date 5/29. .  -Continue IV cefazolin TID (stop date 5/29) -Blood cultures 4/22 showed NGTD -Weekly CBC (last was 4/26)  OUD: He reports that his cravings are gone, he feels that the current dose is working well. Vitals have been stable and he appears comfortable. -Suboxone 8 mg TID -Zofran  PRN for nausea  Hyponatremia: Hyperkalemia: Na 133, K 4.5. Likely due to emesis and poor intake.   -Encourage PO intake -Repeat BMP in 1-2 days  HFrEF: EF of 20%. He is on lasix 40 mg BID, digoxin 125 mcg daily, amiodarone 200 mg BID, losartan 25 mg BID, carvedilol 6.25 mg BID and spironolactone 25 mg daily at home. Euvolemic on exam. Home medications were restarted yesterday, spironolactone was held due to hyperkalemia will continue to hold for now.  -Continue digoxin 125 mcg, lasix 40 mg BID and losartan 25 mg BID -Restart carvedilol 6.25 mg BID  -Daily weights -Strict I-Os  History of CVA 2/2 embolic source Hx of LV thrombus -Continue home xeralto  Dispo: Anticipated discharge 5/29, pending completion of IV antibiotics.   John Craze, DO 07/26/2018, 6:44 AM Pager: [phone]

## 2018-07-26 NOTE — Progress Notes (Signed)
  Date: 07/26/2018  Patient name: [name]  Medical record number: [id number]  Date of birth: [birth year]   I have seen and evaluated this patient and I have discussed the plan of care with the house staff. Please see their note for complete details.  [name], M.D., Ph.D. 07/26/2018, 3:50 PM

## 2018-07-26 NOTE — Progress Notes (Addendum)
All 5 Stitches were removed patient tolerated well.

## 2018-07-27 DIAGNOSIS — R233 Spontaneous ecchymoses: Secondary | ICD-10-CM

## 2018-07-27 MED ORDER — SPIRONOLACTONE 25 MG PO TABS
25.0000 mg | ORAL_TABLET | Freq: Every day | ORAL | Status: DC
  Administered 2018-07-27 – 2018-07-28 (×2): 25 mg via ORAL
  Filled 2018-07-27 (×2): qty 1

## 2018-07-27 MED ORDER — FUROSEMIDE 40 MG PO TABS
40.0000 mg | ORAL_TABLET | Freq: Two times a day (BID) | ORAL | Status: DC
  Administered 2018-07-27 – 2018-07-29 (×5): 40 mg via ORAL
  Filled 2018-07-27 (×5): qty 1

## 2018-07-27 NOTE — Progress Notes (Signed)
  Date: 07/27/2018  Patient name: John Davenport  Medical record number: [id number]  Date of birth: June 02, 1975   I have seen and evaluated this patient and I have discussed the plan of care with the house staff. Please see their note for complete details. I concur with their findings with the following additions/corrections:   Doing well on his antibiotics.  Also responding well to Suboxone.  He does have a petechial rash on both ankles.  Possibly related to his infection, we will monitor this.  No fevers.  Constipation is improving with increased bowel regimen.  [name], M.D., Ph.D. 07/27/2018, 2:58 PM

## 2018-07-27 NOTE — Progress Notes (Signed)
   Subjective: No overnight events.  Patient reports feeling well this morning.  He has had bowel movements.  He states that for the past 2 nights he has been up urinating.  Asked to decrease his diuretic dose but it was discussed that we will move the timings of these medications to be given sooner.  He also noticed a rash on his lower extremities that started yesterday.  He denies any itching or burning of these lesions.  He is continuing to have nausea most mornings around 4 AM.  All questions and concerns addressed  Objective:  Vital signs in last 24 hours: Vitals:   07/26/18 0827 07/26/18 1657 07/26/18 2030 07/27/18 0454  BP: 109/66 105/70 110/69 109/62  Pulse: 61 (!) 57 61 64  Resp: 19 17 18 18   Temp: (!) 97.4 F (36.3 C) (!) 97.4 F (36.3 C) 97.9 F (36.6 C) 97.6 F (36.4 C)  TempSrc: Oral Oral Oral Oral  SpO2: 100% 100% 97% 100%  Weight:   80.5 kg   Height:       Physical Exam General: Seen comfortably laying in bed, no distress CV: Regular rate and rhythm, no murmurs Pulmonary: Clear to auscultation bilaterally, normal effort Skin: Incision site stitches removed, clean and dry incision site with some fluctuance, bilateral lower extremity petechial rash mostly on the ankles   Assessment/Plan:  Principal Problem:   MSSA bacteremia Active Problems:   Chronic combined systolic and diastolic CHF (congestive heart failure) (HCC)   CKD (chronic kidney disease), stage III (HCC)   Opioid use disorder, severe, in early remission (HCC)   Pressure ulcer of sacral region, stage 2 [facility])  Mr. 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 Patient left AMA to take care of a social situation involving his twin children.Returned to [facility] for  continuation of his treatment.  MSSA bacteremia 2/2 IVDU s/p ICD removal: ICD removed 4/17, stiches removed yesterday. Repeat blood cultures NGTD. Continuing IV cefazolin for 6 week course, stop date 5/29. Noted to have a petechial rash on bilateral LE today, most likely 2/2 to infection. Will continue to monitor.  -Continue IV cefazolin TID (stop date 5/29) -Weekly CBC (last was 4/26)  OUD: Stable. -Suboxone 8 mg TID -Zofran PRN for nausea  Hyponatremia: Hyperkalemia: Na 133, K 4.5 on 4/28. Hyponatremia likely due to emesis and poor intake.  -Encourage PO intake -Repeat BMP in 1-2 days  HFrEF: EF of 20%.He is on lasix40 mg BID, digoxin125 mcg daily, amiodarone200 mg BID, losartan25 mg BID,carvedilol 6.25 mg BID andspironolactone25 mg dailyat home.Euvolemic on exam.Spironolactone was held due to hyperkalemia will resume today. -Continue digoxin125 mcg, lasix40 mg BID and losartan 25 mg BID, carvedilol 6.25 mg BID  -Restart spironolactone 25 mg daily  -Daily weights -Strict I-Os  History of CVA 2/2 embolic source Hx of LV thrombus -Continue home xeralto  Dispo: Anticipated discharge 5/29, pending completion of IV antibiotics.   Mike Craze, DO 07/27/2018, 7:06 AM Pager: [phone]

## 2018-07-28 DIAGNOSIS — F1123 Opioid dependence with withdrawal: Principal | ICD-10-CM

## 2018-07-28 LAB — RAPID URINE DRUG SCREEN, HOSP PERFORMED
Amphetamines: NOT DETECTED
Barbiturates: NOT DETECTED
Benzodiazepines: NOT DETECTED
Cocaine: NOT DETECTED
Opiates: NOT DETECTED
Tetrahydrocannabinol: NOT DETECTED

## 2018-07-28 NOTE — Progress Notes (Signed)
Subjective: Patient reports that he feels that he is withdrawing again for the past 2 nights.  He has been having abdominal pain that he describes as someone cutting him with a knife.  He is also endorsing runny nose, restless sore feeling in his legs and sweating.  He states he is only slept about 4 hours in the past 2 nights.  He thought he was getting Suboxone every 6 hours however he was informed that it was ordered as every 8 and that is the max dose that we can go on. He states his symptoms are worse at night. Discussed that we will speak to our colleague who works in the [place] clinic to help with this. All questions and concerns addressed.  Objective:  Vital signs in last 24 hours: Vitals:   07/27/18 1700 07/27/18 2025 07/28/18 0414 07/28/18 0444  BP: 101/62 101/64  107/64  Pulse: 66 64  (!) 58  Resp: 18 18  18   Temp: 98.2 F (36.8 C) 98.8 F (37.1 C)  97.7 F (36.5 C)  TempSrc: Oral Oral  Oral  SpO2: 98% 99%  100%  Weight:  80.7 kg 80.7 kg   Height:       Physical Exam Gen: seen laying in bed, no distress, diaphoretic  CV: RRR, no murmurs  Abdomen: bowel sounds present, no ttp, soft Skin: bilateral LE petechial rash, ICD incision site clean and dry, mild fluctuance, diaphoretic  Assessment/Plan:  Principal Problem:   MSSA bacteremia Active Problems:   Chronic combined systolic and diastolic CHF (congestive heart failure) (HCC)   CKD (chronic kidney disease), stage III (HCC)   Opioid use disorder, severe, in early remission (HCC)   Pressure ulcer of sacral region, stage 2 [facility])  Mr. 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 Patient left AMA to take care of a social situation involving his twin children.Returned to [facility]  forcontinuation of his treatment.  MSSA bacteremia 2/2 IVDU s/p ICD removal:ICD removed 4/17, stiches removed yesterday. Repeat blood cultures NGTD. ContinuingIV cefazolin for 6 week course, stop date 5/29. Petechial rash on bilateral ankles.  -ContinueIV cefazolinTID (stop date 5/29) -Weekly CBC (last was 4/26)  OUD:  Patient states that he feels he is withdrawing again. COWS score 14.  He endorses nominal pain, nausea, sweating and restlessness/soreness in both lower extremities.  Discussed with [name] who stated often times it is administration issue. Hospital pharmacy does not have suboxone films. Suboxone tablet needs to be placed sublingually for 30 minutes before swallowing.  She advised to make sure that the patient is keeping his mouth moist before placing the tablet under his tongue and to make sure to wait 30 min before swallowing. Will educate the patient on the proper administration and monitor.  -Suboxone 8 mg TID -Zofran PRN for nausea  Hyponatremia: Hyperkalemia: Na133, K 4.5 on 4/28. Hyponatremia likely due to emesis and poor intake.  -Encourage PO intake -Repeat BMP tomorrow morning   HFrEF: EF of 20%. -C/w digoxin125 mcg,lasix40 mg BIDandlosartan 25 mg BID, carvedilol 6.25 mg BIDand spironolactone 25 mg daily  -Daily weights -Strict I-Os  History of CVA 2/2 embolic source Hx of LV thrombus -Continue home xeralto  Dispo: Anticipated discharge5/29, pending completion of IV antibiotics.   Galilee Pierron N, DO 07/28/2018, 6:46 AM

## 2018-07-28 NOTE — Progress Notes (Signed)
  Date: 07/28/2018  Patient name: John Davenport  Medical record number: [id number]  Date of birth: 1976/01/12   I have seen and evaluated this patient and I have discussed the plan of care with the house staff. Please see their note for complete details. I concur with their findings with the following additions/corrections:   He reports return of withdrawal symptoms overnight. COWS score likely up to 14 by his description, suggesting moderate withdrawal. All doses were given on schedule yesterday, but possible he is not waiting to allow complete dissolution. We will review and emphasize appropriate sublingual administration with him, and hopefully nursing staff can help ensure he is taking sublingually, not orally.  [name], M.D., Ph.D. 07/28/2018, 1:17 PM

## 2018-07-29 DIAGNOSIS — Z5329 Procedure and treatment not carried out because of patient's decision for other reasons: Secondary | ICD-10-CM

## 2018-07-29 LAB — BASIC METABOLIC PANEL
Anion gap: 8 (ref 5–15)
BUN: 13 mg/dL (ref 6–20)
CO2: 31 mmol/L (ref 22–32)
Calcium: 8.7 mg/dL — ABNORMAL LOW (ref 8.9–10.3)
Chloride: 97 mmol/L — ABNORMAL LOW (ref 98–111)
Creatinine, Ser: 1.2 mg/dL (ref 0.61–1.24)
GFR calc Af Amer: >60 mL/min (ref >60)
GFR calc non Af Amer: >60 mL/min (ref >60)
Glucose, Bld: 86 mg/dL (ref 70–99)
Potassium: 4.8 mmol/L (ref 3.5–5.1)
Sodium: 136 mmol/L (ref 135–145)

## 2018-07-29 MED ORDER — RIVAROXABAN 15 MG PO TABS: 15.0000 mg | ORAL_TABLET | Freq: Every day | ORAL | 0 refills | Status: DC

## 2018-07-29 MED ORDER — ONDANSETRON 4 MG PO TBDP: 4.0000 mg | ORAL_TABLET | Freq: Four times a day (QID) | ORAL | 0 refills | Status: DC | PRN

## 2018-07-29 MED ORDER — AMIODARONE HCL 200 MG PO TABS: 200.0000 mg | ORAL_TABLET | Freq: Two times a day (BID) | ORAL | 0 refills | Status: DC

## 2018-07-29 MED ORDER — SPIRONOLACTONE 25 MG PO TABS: 25.0000 mg | ORAL_TABLET | Freq: Every day | ORAL | 3 refills | Status: DC

## 2018-07-29 MED ORDER — DIGOXIN 125 MCG PO TABS: 125.0000 ug | ORAL_TABLET | Freq: Every day | ORAL | 0 refills | Status: DC

## 2018-07-29 MED ORDER — BUPRENORPHINE HCL-NALOXONE HCL 8-2 MG SL SUBL: 1.0000 | SUBLINGUAL_TABLET | Freq: Three times a day (TID) | SUBLINGUAL | 0 refills | Status: AC

## 2018-07-29 MED ORDER — NITROGLYCERIN 0.4 MG SL SUBL: 0.4000 mg | SUBLINGUAL_TABLET | SUBLINGUAL | 0 refills | Status: DC | PRN

## 2018-07-29 MED ORDER — SODIUM CHLORIDE 0.9 % IV SOLN
2.0000 g | Freq: Once | INTRAVENOUS | Status: AC
  Administered 2018-07-29: 2 g via INTRAVENOUS
  Filled 2018-07-29: qty 20

## 2018-07-29 MED ORDER — LOSARTAN POTASSIUM 25 MG PO TABS: 25.0000 mg | ORAL_TABLET | Freq: Two times a day (BID) | ORAL | 2 refills | Status: DC

## 2018-07-29 MED ORDER — CARVEDILOL 6.25 MG PO TABS: 6.2500 mg | ORAL_TABLET | Freq: Two times a day (BID) | ORAL | 0 refills | Status: DC

## 2018-07-29 MED ORDER — FUROSEMIDE 40 MG PO TABS: 40.0000 mg | ORAL_TABLET | Freq: Two times a day (BID) | ORAL | 0 refills | Status: DC

## 2018-07-29 NOTE — Care Management Important Message (Signed)
Important Message  Patient Details  Name: John Davenport MRN: [id number] Date of Birth: 11-15-1975   Medicare Important Message Given:  Yes    Amely Voorheis 07/29/2018, 2:05 PM

## 2018-07-29 NOTE — Progress Notes (Signed)
   Subjective: Patient reports feeling much better this morning. Denies any more withdrawal symptoms.  Unfortunately patient states that his children's maternal grandmother passed away yesterday and that he may need to leave for the cremation.  He states that the maternal grandmother is the only family his kids have left on their mother's side.  He understands this is a difficult situation and time.  He is waiting on family to let them know the plan regarding funeral or cremation.  He states he may need to leave but is willing to come back to the hospital after taking his kids.  Discussed that this is not ideal however we would work with him in this difficult situation.  He is to keep [place] updated regarding the plans.  All questions and concerns addressed.  Objective:  Vital signs in last 24 hours: Vitals:   07/28/18 0414 07/28/18 0444 07/28/18 2111 07/29/18 0533  BP:  107/64 103/69 97/64  Pulse:  (!) 58 64 (!) 55  Resp:  18 18 18   Temp:  97.7 F (36.5 C) 98.1 F (36.7 C) 98.2 F (36.8 C)  TempSrc:  Oral Oral Oral  SpO2:  100% 92% (!) 88%  Weight: 80.7 kg     Height:       Physical exam General: Seen laying in bed comfortably, no distress CV: Regular rate and rhythm, no murmurs rubs or gallops Skin: Swelling and erythema at ICD removal incision site with fluctuance, no tenderness to palpation or warmth, bilateral lower extremity petechial rash mostly on ankles  Assessment/Plan:  Principal Problem:   MSSA bacteremia Active Problems:   Chronic combined systolic and diastolic CHF (congestive heart failure) (HCC)   CKD (chronic kidney disease), stage III (HCC)   Opioid use disorder, severe, in early remission ([place])   Pressure ulcer of sacral region, stage 2 [facility])  John Davenport is a39 year old male history of CAD s/p CABG NSTEMI cocaine use, ischemic cardiomyopathy status post ICD, HFrEF, COPD, 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 Patient left AMA to take care of a social situation involving his twin children.[facility]ed [facility].  MSSA bacteremia 2/2 IVDU s/p ICD removal:ICD removed 4/17, stiches removed yesterday.Repeat blood cultures NGTD.ContinuingIV cefazolin for 6 week course, stop date 5/29. -ContinueIV cefazolinTID (stop date 5/29) -Weekly CBC (last was 4/26)  OUD: Patient reports he is no longer having withdrawal symptoms. He reports keeping sublingual suboxone tablet under his tongue for longer yesterday after we discussed it may not be absorbed properly otherwise.  -Suboxone 8 mg TID -Zofran PRN for nausea  Hyponatremia: Hyperkalemia: Na136, K 4.8.Stable.  -Encourage PO intake -Repeat BMP in a couple of days  HFrEF: EF of 20%. -C/w digoxin125 mcg,lasix40 mg BIDandlosartan 25 mg BID,carvedilol 6.25 mg BIDand spironolactone 25 mg daily -Daily weights -Strict I-Os  History of CVA 2/2 embolic source Hx of LV thrombus -Continue home xeralto  Dispo: Anticipated discharge5/29, pending completion of IV antibiotics. However, patient states he may need to leave to attend a family cremation. He states he will come back after taking his children to the funeral. Discussed that is not ideal but will work with him. His family plans are pending.    John Davenport N, DO 07/29/2018, 7:09 AM

## 2018-07-29 NOTE — Progress Notes (Signed)
Patient has been started on buprenorphine-naloxone for OUD by his primary team.  He is doing well on the current dose of medication.  He will be leaving the hospital for a personal issue and returning tomorrow per him to complete his therapy for an infection.  The medical team, including Dr. Rebeca Alert and [name] have asked that I prescribe his buprenorphine-naloxone as I have the license to prescribe this medication outpatient.  I reviewed Mr. Hemme record and will prescribe buprenorphine at current dose for 3 days only.  No refills if patient does not return to complete antibiotic therapy.   Discussed plan with [name].   Gilles Chiquito, MD

## 2018-07-29 NOTE — Progress Notes (Signed)
  Date: 07/29/2018  Patient name: John Davenport  Medical record number: [id number]  Date of birth: 1976/03/06   I have seen and evaluated this patient and I have discussed the plan of care with the house staff. Please see their note for complete details. I concur with their findings with the following additions/corrections:   Doing well on IV antibiotics and Suboxone.  Unfortunately, as described by [name], his mother-in-law died unexpectedly and he feels it is very important he take his kids to the funeral as she was the last surviving relative on their mom's side of the family (their mom died in a car crash previously).  Although the situation is not ideal, we will give him a dose of IV ceftriaxone and have asked him to return within 24 hours so there is no interruption of his antibiotic therapy.  [name] has kindly prescribed 3 days of Suboxone that he can take to avoid withdrawal and relapse while he is gone.  I am hopeful that he will return as he has promised.  [name], M.D., Ph.D. 07/29/2018, 3:35 PM

## 2018-07-29 NOTE — Progress Notes (Signed)
DISCHARGE NOTE HOME John Davenport to be discharged home per MD order. Discussed prescriptions and follow up appointments with the patient. Prescriptions given to patient; medication list explained in detail.Reminded patient to get his medicines from [employer]. Patient verbalized understanding.  Skin sacral pressure stage ii noted, no evidence of skin tears noted. IV catheter discontinued intact. Site without signs and symptoms of complications. Dressing and pressure applied. Pt denies pain at the site currently. No complaints noted.  Patient free of lines, drains, and wounds.   An After Visit Summary (AVS) was printed and given to the patient. Patient escorted via wheelchair, and discharged home via private auto.  [place], Zenon Mayo, RN

## 2018-07-31 NOTE — Discharge Instructions (Signed)
[name],  You were hospitalized due to an infection in your blood stream. You are going to need a total of 6 weeks of IV antibiotics. I understand you need to leave for family matters but it is really important that you return in 24 hours to continue antibiotic therapy. I have given you a dose of IV antibiotics right now that will last 24 hours so please make sure to come back tomorrow, about 24 hours after your discharge.  I have sent in all of your medications including Suboxone to [facility] on [address]. Thanks for allowing [place] to be a part of your care, we will plan on seeing you back here tomorrow.

## 2018-08-01 ENCOUNTER — Telehealth: Payer: Self-pay | Admitting: Internal Medicine

## 2018-08-01 NOTE — Telephone Encounter (Signed)
Called John Davenport, but no answer and did not have permission to leave a message.  Unfortunately, he left against the advice of our medical team on 07/29/2018 to take his children to their grandmother's funeral and was instructed to return within 24 hours to continue his course of IV antibiotics for MSSA bacteremia and infected ICD.  Unfortunately, he has not returned.

## 2018-08-25 ENCOUNTER — Ambulatory Visit: Payer: Medicare Other | Admitting: Allergy

## 2019-11-24 ENCOUNTER — Other Ambulatory Visit: Payer: Self-pay

## 2019-11-24 ENCOUNTER — Encounter (HOSPITAL_BASED_OUTPATIENT_CLINIC_OR_DEPARTMENT_OTHER): Payer: Self-pay | Admitting: Emergency Medicine

## 2019-11-24 ENCOUNTER — Emergency Department (HOSPITAL_BASED_OUTPATIENT_CLINIC_OR_DEPARTMENT_OTHER): Payer: Medicare Other

## 2019-11-24 ENCOUNTER — Emergency Department (HOSPITAL_BASED_OUTPATIENT_CLINIC_OR_DEPARTMENT_OTHER)
Admission: EM | Admit: 2019-11-24 | Discharge: 2019-11-24 | Disposition: A | Discharge status: Home / Self Care | Payer: Medicare Other | Source: Home / Self Care | Attending: Emergency Medicine | Admitting: Emergency Medicine

## 2019-11-24 DIAGNOSIS — I5042 Chronic combined systolic (congestive) and diastolic (congestive) heart failure: Secondary | ICD-10-CM | POA: Insufficient documentation

## 2019-11-24 DIAGNOSIS — Z79899 Other long term (current) drug therapy: Secondary | ICD-10-CM | POA: Insufficient documentation

## 2019-11-24 DIAGNOSIS — R079 Chest pain, unspecified: Secondary | ICD-10-CM | POA: Diagnosis not present

## 2019-11-24 DIAGNOSIS — Z951 Presence of aortocoronary bypass graft: Secondary | ICD-10-CM | POA: Insufficient documentation

## 2019-11-24 DIAGNOSIS — I2510 Atherosclerotic heart disease of native coronary artery without angina pectoris: Secondary | ICD-10-CM | POA: Insufficient documentation

## 2019-11-24 DIAGNOSIS — L02414 Cutaneous abscess of left upper limb: Secondary | ICD-10-CM | POA: Diagnosis not present

## 2019-11-24 DIAGNOSIS — R072 Precordial pain: Secondary | ICD-10-CM | POA: Diagnosis not present

## 2019-11-24 DIAGNOSIS — R7881 Bacteremia: Secondary | ICD-10-CM | POA: Diagnosis not present

## 2019-11-24 DIAGNOSIS — R0789 Other chest pain: Secondary | ICD-10-CM | POA: Insufficient documentation

## 2019-11-24 DIAGNOSIS — R21 Rash and other nonspecific skin eruption: Secondary | ICD-10-CM | POA: Diagnosis not present

## 2019-11-24 DIAGNOSIS — N183 Chronic kidney disease, stage 3 unspecified: Secondary | ICD-10-CM | POA: Insufficient documentation

## 2019-11-24 DIAGNOSIS — Z7901 Long term (current) use of anticoagulants: Secondary | ICD-10-CM | POA: Insufficient documentation

## 2019-11-24 DIAGNOSIS — L0291 Cutaneous abscess, unspecified: Secondary | ICD-10-CM

## 2019-11-24 DIAGNOSIS — F17210 Nicotine dependence, cigarettes, uncomplicated: Secondary | ICD-10-CM | POA: Insufficient documentation

## 2019-11-24 LAB — LACTIC ACID, PLASMA: Lactic Acid, Venous: 1.5 mmol/L (ref 0.5–1.9)

## 2019-11-24 LAB — CBC
HCT: 43.9 % (ref 39.0–52.0)
Hemoglobin: 13.8 g/dL (ref 13.0–17.0)
MCH: 27 pg (ref 26.0–34.0)
MCHC: 31.4 g/dL (ref 30.0–36.0)
MCV: 85.9 fL (ref 80.0–100.0)
Platelets: 331 10*3/uL (ref 150–400)
RBC: 5.11 MIL/uL (ref 4.22–5.81)
RDW: 15.7 % — ABNORMAL HIGH (ref 11.5–15.5)
WBC: 16.5 10*3/uL — ABNORMAL HIGH (ref 4.0–10.5)
nRBC: 0 % (ref 0.0–0.2)

## 2019-11-24 LAB — BASIC METABOLIC PANEL
Anion gap: 12 (ref 5–15)
BUN: 15 mg/dL (ref 6–20)
CO2: 23 mmol/L (ref 22–32)
Calcium: 9 mg/dL (ref 8.9–10.3)
Chloride: 100 mmol/L (ref 98–111)
Creatinine, Ser: 0.86 mg/dL (ref 0.61–1.24)
GFR calc Af Amer: >60 mL/min (ref >60)
GFR calc non Af Amer: >60 mL/min (ref >60)
Glucose, Bld: 105 mg/dL — ABNORMAL HIGH (ref 70–99)
Potassium: 3.4 mmol/L — ABNORMAL LOW (ref 3.5–5.1)
Sodium: 135 mmol/L (ref 135–145)

## 2019-11-24 LAB — C-REACTIVE PROTEIN: CRP: 11.8 mg/dL — ABNORMAL HIGH (ref <1.0)

## 2019-11-24 LAB — SEDIMENTATION RATE: Sed Rate: 80 mm/hr — ABNORMAL HIGH (ref 0–16)

## 2019-11-24 LAB — TROPONIN I (HIGH SENSITIVITY): Troponin I (High Sensitivity): 11 ng/L (ref <18)

## 2019-11-24 MED ORDER — CLINDAMYCIN HCL 150 MG PO CAPS: 450.0000 mg | ORAL_CAPSULE | Freq: Three times a day (TID) | ORAL | 0 refills | Status: DC

## 2019-11-24 MED ORDER — LIDOCAINE-EPINEPHRINE (PF) 2 %-1:200000 IJ SOLN
10.0000 mL | Freq: Once | INTRAMUSCULAR | Status: AC
  Administered 2019-11-24: 10 mL
  Filled 2019-11-24: qty 20

## 2019-11-24 MED ORDER — LORAZEPAM 2 MG/ML IJ SOLN
INTRAMUSCULAR | Status: AC
  Administered 2019-11-24: 2 mg
  Filled 2019-11-24: qty 1

## 2019-11-24 MED ORDER — CLINDAMYCIN PHOSPHATE 600 MG/50ML IV SOLN
600.0000 mg | Freq: Once | INTRAVENOUS | Status: AC
  Administered 2019-11-24: 600 mg via INTRAVENOUS
  Filled 2019-11-24: qty 50

## 2019-11-24 MED ORDER — DIPHENHYDRAMINE HCL 25 MG PO CAPS
ORAL_CAPSULE | ORAL | Status: AC
  Administered 2019-11-24: 25 mg
  Filled 2019-11-24: qty 1

## 2019-11-24 NOTE — ED Notes (Signed)
Informed EDP that I can only get 1 IV access.  Patient's arms and bilateral lower legs is full on needle tracts.  He stated that the last time that he used heroine is 8 months ago but the needle tracts are new.  He also stated that he is on Suboxine.

## 2019-11-24 NOTE — ED Notes (Signed)
Pt on monitor 

## 2019-11-24 NOTE — ED Notes (Signed)
In to round on pt, began using vulgarity towards staff, also had secured synringes and needles from I & D tray and would not give them back to nurse, was very aggressive towards staff, would not answer questions, pt escorted to check out, would not stop, family here to take pt home

## 2019-11-24 NOTE — ED Triage Notes (Signed)
Chest pain since yesterday. Abscess to L arm. Hx of MI

## 2019-11-24 NOTE — ED Notes (Signed)
IV attempt x3 unsuccessful, RN aware, patient tolerated well.

## 2019-11-24 NOTE — ED Notes (Signed)
Went to patient's room and noted that patient is wrapping something on his shirt that was placed below the  cabinet.  I asked him as to what he is doing.  All the cardiac electrodes was off except for 3 electrodes.  Patient started to get defensive and told me that he is ready to go.  He was using cursing words about this place and the staff.  When I unfold his shirt, the Lidocaine, syringe with needle and a syringe fell off his shirt.  Lidocaine is the left over and the syringe with needle is with medication (I don't know what kind of medication).  I informed him that I will removed the IV if he is planning to go home.  He told me to wait until his mom arrives.   Charge nurse and PA came to the room and new order for Ativan and Benadryl was ordered.  Patient decided to stay after EDP informed him that she will order Ativan to calm his nerves.

## 2019-11-24 NOTE — ED Provider Notes (Signed)
[facility] EMERGENCY DEPARTMENT Provider Note   CSN: [id number] Arrival date & time: 11/24/19  8341     History Chief Complaint  Patient presents with  . Chest Pain  . Abscess    John Davenport is a 44 y.o. male with history of CABG, ischemic cardiomyopathy status post ICD that was removed April 2020, CVA on apixaban, tobacco use, COPD, previous IV drug use presents to the ED for evaluation of chest pain.  This was sudden at 3 AM while he was sleeping and, it woke him up.  It feels like someone is sitting on his chest, he points to the left mid chest.  Nonradiating.  Constant since 3 AM.  Feels like "my 5 other heart attacks".  Nothing makes it better or worse.  Denies fevers, congestion, sore throat, cough, shortness of breath.  Denies leg or abdominal swelling.  Not vaccinated for COVID-19.  Also reports he got bit by something 2 to 3 weeks ago in the left inner elbow crease.  Over the last 2 days it has gotten worse and now has an abscess in the area that is very painful.  Has not been able to eat in 1 week because she gets very nauseated every time she eats.  Feels hungry and has hunger pains in his abdomen.  Thinks a spider bit him but he is unsure.  Adamantly states he has not used IV drug use in at least 6 to 8 months.  Patient has several track marks in his arms legs and feet but states these are old.  Reports feeling sweating from hurting.  States he had an infection in his body from his defibrillator last year and today he feels bad just like that time.  Denies fever.  Denies abdominal pain.  No diarrhea.  States he was supposed to go back to see cardiology but has not because of COVID-19, last saw him approximately 1 year ago.  Has been compliant with all his medicines including blood thinner. On Suboxone now, last dose last night.   HPI     Past Medical History:  Diagnosis Date  . Anxiety state, unspecified   . Back pain   . Cancer ([place])    Lung CA  . Chronic  systolic heart failure ([place])    a. Ischemic CM (EF 30% by echo in 2011) b. echo 08/2015: EF 20% with severe HK and AK of the inferoseptal and apical myocardium.  . Coronary artery disease    a. 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  b. eventually required single vessel CABG (L-LAD) in 03/2009 due to restenosis. c. cath 2013 showed patent LIMA sequential to the diags with LAD filling retrograde, no new disease in RCA and Cx, low LVEDP, sx felt noncardiac. d. 08/2015: NSTEMI: CTO of prox LAD, 99% stenosis of small dCx (no stent), + for cocaine  . Esophageal reflux   . Heart attack ([place])   . History of hepatitis C virus infection 07/15/2018   07/20/18 Viral RNA negative suggest past, cleared infection  . Hypercholesteremia   . ICD (implantable cardioverter-defibrillator) infection ([place])    a. [place] Sci ICD 12/2008.   . Ischemic cardiomyopathy    echocardiogram 12/11: Mild LVH, EF 30-35%, anteroseptal and apical akinesis, grade 1 diastolic dysfunction  . LBP (low back pain)   . OSA (obstructive sleep apnea) 12/30/2010  . Paroxysmal ventricular tachycardia ([place])   . Stroke ([place])   .  Tobacco abuse     Patient Active Problem List   Diagnosis Date Noted  . Pressure ulcer of sacral region, stage 2 ([place]) 07/22/2018  . AKI (acute kidney injury) ([place])   . History of hepatitis C virus infection 07/15/2018  . Hyponatremia 07/15/2018  . Thrombocytopenia ([place]) 07/14/2018  . ICD (implantable cardioverter-defibrillator) infection ([place]) 07/13/2018  . Opioid use disorder, severe, in early remission ([place]) 07/13/2018  . Anaphylaxis 07/12/2018  . Bacteremia due to methicillin susceptible Staphylococcus aureus (MSSA) 07/12/2018  . MSSA bacteremia 07/12/2018  . Chest pain 07/11/2018  . Encounter for therapeutic drug monitoring 06/09/2016  . Embolic stroke ([place]) 84/13/2440  . Hypotension 05/23/2016  . Smokeless tobacco use 05/23/2016  . Cocaine abuse ([place])  05/23/2016  . Cerebrovascular accident (CVA) due to thrombosis of left middle cerebral artery (HCC) s/p IV tPA and mechanical thrombectomy   . Chronic systolic congestive heart failure ([place])   . Cough with sputum 04/17/2016  . Intractable upper abdominal pain 03/09/2016  . CKD (chronic kidney disease), stage III ([place]) 03/09/2016  . 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 RUQ abdominal pain 03/09/2016  . NSTEMI (non-ST elevated myocardial infarction) ([place]) 09/05/2015  . Ischemic cardiomyopathy 09/05/2015  . Tobacco abuse 09/05/2015  . Hyperlipidemia 09/05/2015  . Renal insufficiency 09/05/2015  . OSA (obstructive sleep apnea) 12/30/2010  . Cough 12/09/2010  . Dental abscess 12/09/2010  . GERD 04/10/2010  . PAROXYSMAL VENTRICULAR TACHYCARDIA 03/22/2010  . Chronic combined systolic and diastolic CHF (congestive heart failure) ([place]) 03/22/2010  . Generalized anxiety disorder 05/16/2009  . MYOCARDIAL INFARCTION 12/19/2008  . CAD in native artery 12/19/2008    Past Surgical History:  Procedure Laterality Date  . CARDIAC CATHETERIZATION N/A 09/06/2015   Procedure: Left Heart Cath and Cors/Grafts Angiography;  Surgeon: Sherren Mocha, MD;  Location: [facility];  Service: Cardiovascular;  Laterality: N/A;  . CARDIAC DEFIBRILLATOR PLACEMENT    . CARDIAC DEFIBRILLATOR PLACEMENT     2010 by JA  . CORONARY ANGIOPLASTY WITH STENT PLACEMENT     LAD stenting 10/09 and re-opening 6/10  . CORONARY ARTERY BYPASS GRAFT     1/11  . ICD LEAD REMOVAL Left 07/16/2018   Procedure: ICD LEAD REMOVAL;  Surgeon: Evans Lance, MD;  Location: [facility];  Service: Cardiovascular;  Laterality: Left;  [place] back up  . IR GENERIC HISTORICAL  05/21/2016   IR PERCUTANEOUS ART THROMBECTOMY/INFUSION INTRACRANIAL INC DIAG ANGIO 05/21/2016 Luanne Bras, MD [facility]  . IR GENERIC HISTORICAL  06/16/2016   IR RADIOLOGIST EVAL & MGMT 06/16/2016 [facility]  . LEFT HEART CATHETERIZATION WITH CORONARY  ANGIOGRAM N/A 08/07/2011   Procedure: LEFT HEART CATHETERIZATION WITH CORONARY ANGIOGRAM;  Surgeon: Josue Hector, MD;  Location: [facility] CATH [facility];  Service: Cardiovascular;  Laterality: N/A;  . RADIOLOGY WITH ANESTHESIA N/A 05/21/2016   Procedure: RADIOLOGY WITH ANESTHESIA;  Surgeon: Luanne Bras, MD;  Location: [facility];  Service: Radiology;  Laterality: N/A;  . RIGHT/LEFT HEART CATH AND CORONARY ANGIOGRAPHY N/A 02/03/2018   Procedure: RIGHT/LEFT HEART CATH AND CORONARY ANGIOGRAPHY;  Surgeon: Larey Dresser, MD;  Location: [facility];  Service: Cardiovascular;  Laterality: N/A;  . TEE WITHOUT CARDIOVERSION N/A 07/16/2018   Procedure: TRANSESOPHAGEAL ECHOCARDIOGRAM (TEE);  Surgeon: Evans Lance, MD;  Location: [facility] OR;  Service: Cardiovascular;  Laterality: N/A;       Family History  Problem Relation Age of Onset  . Heart attack Father 45       died at 36 after  multiple MI's  . COPD Maternal Grandmother     Social History   Tobacco Use  . Smoking status: Current Every Day Smoker    Packs/day: 0.50    Years: 20.00    Pack years: 10.00    Types: Cigarettes  . Smokeless tobacco: Former Systems developer    Types: Chew  . Tobacco comment: smoke 0.5 per week  Vaping Use  . Vaping Use: Never used  Substance Use Topics  . Alcohol use: No  . Drug use: Yes    Types: Cocaine    Home Medications Prior to Admission medications   Medication Sig Start Date End Date Taking? Authorizing Provider  amiodarone (PACERONE) 200 MG tablet Take 1 tablet (200 mg total) by mouth 2 (two) times daily for 30 days. 07/29/18 08/28/18  Sid Falcon, MD  carvedilol (COREG) 6.25 MG tablet Take 1 tablet (6.25 mg total) by mouth 2 (two) times daily for 30 days. 07/29/18 08/28/18  Sid Falcon, MD  clindamycin (CLEOCIN) 150 MG capsule Take 3 capsules (450 mg total) by mouth 3 (three) times daily for 7 days. 11/24/19 12/01/19  Kinnie Feil, PA-C  digoxin (LANOXIN) 0.125 MG tablet Take 1 tablet (125 mcg total) by  mouth daily for 30 days. 07/29/18 08/28/18  Sid Falcon, MD  EPINEPHrine 0.3 mg/0.3 mL IJ SOAJ injection Inject 0.3 mLs (0.3 mg total) into the muscle as needed for anaphylaxis. 07/12/18   Rehman, Areeg N, DO  furosemide (LASIX) 40 MG tablet Take 1 tablet (40 mg total) by mouth 2 (two) times daily for 30 days. 07/29/18 08/28/18  Sid Falcon, MD  losartan (COZAAR) 25 MG tablet Take 1 tablet (25 mg total) by mouth 2 (two) times daily. 07/29/18 10/27/18  Sid Falcon, MD  nitroGLYCERIN (NITROSTAT) 0.4 MG SL tablet Place 1 tablet (0.4 mg total) under the tongue every 5 (five) minutes as needed for up to 30 days for chest pain. 07/29/18 08/28/18  Sid Falcon, MD  ondansetron (ZOFRAN-ODT) 4 MG disintegrating tablet Take 1 tablet (4 mg total) by mouth every 6 (six) hours as needed for nausea or vomiting. 07/29/18   Sid Falcon, MD  Rivaroxaban (XARELTO) 15 MG TABS tablet Take 1 tablet (15 mg total) by mouth daily with supper for 30 days. Restart on Thursday morning. 07/29/18 08/28/18  Sid Falcon, MD  spironolactone (ALDACTONE) 25 MG tablet Take 1 tablet (25 mg total) by mouth at bedtime. 07/29/18   Sid Falcon, MD    Allergies    Patient has no known allergies.  Review of Systems   Review of Systems  Cardiovascular: Positive for chest pain.  Gastrointestinal: Positive for nausea and vomiting.  Skin:       abscess  All other systems reviewed and are negative.   Physical Exam Updated Vital Signs BP 132/81 (BP Location: Right Wrist)   Pulse 71   Temp 97.9 F (36.6 C) (Oral)   Resp 18   Ht 6\' 3"  (1.905 m)   Wt 74.8 kg   SpO2 100%   BMI 20.62 kg/m   Physical Exam Vitals and nursing note reviewed.  Constitutional:      General: He is not in acute distress.    Appearance: He is well-developed. He is diaphoretic.     Comments: Diaphoretic on forehead.   HENT:     Head: Normocephalic and atraumatic.     Right Ear: External ear normal.     Left Ear: External ear normal.  Nose: Nose normal.  Eyes:     General: No scleral icterus.    Conjunctiva/sclera: Conjunctivae normal.  Cardiovascular:     Rate and Rhythm: Normal rate and regular rhythm.     Heart sounds: Normal heart sounds. No murmur heard.      Comments: 1 + radial and DP pulses bilaterally. No calf tenderness. No murmurs.  Pulmonary:     Effort: Pulmonary effort is normal.     Breath sounds: Normal breath sounds.  Abdominal:     Palpations: Abdomen is soft.     Tenderness: There is no abdominal tenderness.  Musculoskeletal:        General: No deformity. Normal range of motion.     Cervical back: Normal range of motion and neck supple.  Skin:    General: Skin is warm.     Capillary Refill: Capillary refill takes less than 2 seconds.     Comments: Approx 3 x 5 cm localized fluctuant abscess, tender left AC. See photo.  This appears well localized without surrounding erythema, fluctuance outwardly.  Full range of motion of the elbow without any pain.  No signs of involvement of the elbow joint.  Several track marks in forearms, distal lower legs, dorsal feet bilaterally in different stages of healing with ecchymosis, scabbing.  Neurological:     Mental Status: He is alert and oriented to person, place, and time.  Psychiatric:        Behavior: Behavior normal.        Thought Content: Thought content normal.        Judgment: Judgment normal.       ED Results / Procedures / Treatments   Labs (all labs ordered are listed, but only abnormal results are displayed) Labs Reviewed  BASIC METABOLIC PANEL - Abnormal; Notable for the following components:      Result Value   Potassium 3.4 (*)    Glucose, Bld 105 (*)    All other components within normal limits  CBC - Abnormal; Notable for the following components:   WBC 16.5 (*)    RDW 15.7 (*)    All other components within normal limits  SEDIMENTATION RATE - Abnormal; Notable for the following components:   Sed Rate 80 (*)    All other  components within normal limits  CULTURE, BLOOD (ROUTINE X 2)  CULTURE, BLOOD (ROUTINE X 2)  SARS CORONAVIRUS 2 BY RT PCR (HOSPITAL ORDER, [facility])  AEROBIC CULTURE (SUPERFICIAL SPECIMEN)  LACTIC ACID, PLASMA  LACTIC ACID, PLASMA  C-REACTIVE PROTEIN  TROPONIN I (HIGH SENSITIVITY)  TROPONIN I (HIGH SENSITIVITY)    EKG EKG Interpretation  Date/Time:  Thursday November 24 2019 10:34:03 EDT Ventricular Rate:  80 PR Interval:    QRS Duration: 155 QT Interval:  373 QTC Calculation: 431 R Axis:   -98 Text Interpretation: Sinus rhythm Ventricular premature complex Nonspecific IVCD with LAD Confirmed by Lennice Sites [phone]) on 11/24/2019 10:38:56 AM Also confirmed by Lennice Sites [phone]), editor Hattie Perch ([phone])  on 11/24/2019 11:58:38 AM   Radiology DG Chest Port 1 View  Result Date: 11/24/2019 CLINICAL DATA:  Left chest pain EXAM: PORTABLE CHEST 1 VIEW COMPARISON:  07/17/2018 FINDINGS: Postop CABG. Heart size upper normal. Mild vascular congestion without edema or effusion. No focal infiltrate. No acute skeletal abnormality. IMPRESSION: Prominent heart size with mild vascular congestion. Negative for edema. Electronically Signed   By: Franchot Gallo M.D.   On: 11/24/2019 10:59    Procedures Procedures (  including critical care time)  Medications Ordered in ED Medications  lidocaine-EPINEPHrine (XYLOCAINE W/EPI) 2 %-1:200000 (PF) injection 10 mL (10 mLs Infiltration Given by Other 11/24/19 1156)  clindamycin (CLEOCIN) IVPB 600 mg ( Intravenous Stopped 11/24/19 1331)  LORazepam (ATIVAN) 2 MG/ML injection (2 mg  Given 11/24/19 1301)  diphenhydrAMINE (BENADRYL) 25 mg capsule (25 mg  Given 11/24/19 1305)    ED Course  I have reviewed the triage vital signs and the nursing notes.  Pertinent labs & imaging results that were available during my care of the patient were reviewed by me and considered in my medical decision making (see chart for  details).  Clinical Course as of Nov 23 1348  Thu Nov 24, 2019  1026 Left AMA last hospitalization but states his wife got killed in car wreck and had to leave to find care of his kids. Came back and stayed in hospital til august. Received antibiotics    [CG]  1031 4 pills suboxone a day ?24 mg  Last time yesterday 9 pm  Last opioid use 6-8 months ago    [CG]  1148 WBC(!): 16.5 [CG]    Clinical Course User Index [CG] Kinnie Feil, PA-C   MDM Rules/Calculators/A&P                           This patient complains of left sided chest pain and left arm abscess.  Significant cardiac history. History of IVDU/polysubstance abuse reportedly none in the last 6-8 months now on suboxone.    Exam today reveals left AC abscess and multiple track marks in multiple stages of healing.  No splinter hemorrhages, osler nodes or Janeway lesions. No fever.  No respiratory symptoms. Unvaccinated for [place].  I obtained additional history from triage, nursing notes and review of medical chart.  Previous medical records available, nursing notes reviewed to obtain more history and assist with MDM  EMR reveals history of CAD s/p CABG and stents, ICM and chronic systolic CHF with severely decreased LVEF s/p ICD that was removed 06/2018 due to infection, CVA suspected to be from LV thrombus.  I saw this patient last year.  Frequently leaves AMA from hospital/ER. I went through patient's med list and states he has been taking all of them including anticoagulant.   Chief complain involves an extensive number of treatment options and is a complaint that carries with it a high risk of complications and morbidity and mortality.    The differential diagnosis includes ACS, NSTEMI, pericarditis, endocarditis, pneumonia, COVID.  His CP is constant non pleuritic and no hypoxia, while on anticoagulants doubt PE.  No peripheral loss of pulses or neuro deficits. Denies systemic symptoms of infection, VS normal.   I have  ordered lab work and imaging studies including labs including troponin, CRP, sed rate, lactic acid, COVID, CXR and EKG.   I have personally visualized and interpreted labs and imaging.    ER work up reveals mild vascular congestion. EKG without ischemic changes, arrhythmias.  Trop 11, pending second. Mild leukocytosis 16.5, normal lactic acid. Afebrile without tachycardia.  Sed rate 80.    Abscess drained in ER with significant improvement in swelling and large amount of purulent bloody drainage removed.  No evidence of significant cellulitis or extension of abscess, seemed to be well localized.    I have ordered continuous cardiac monitoring, pulse ox.  Will plan for serial re-examinations. Close monitoring.   I have ordered IV clindamycin while pending  second troponin.    I have re-evaluated the patient.  He is restless, upset, wants to leave.  States his whole body hurts and he hasn't slept in 4 days.  He is not sure if he is withdrawing from suboxone.  He agreed to stay. Ordered ativan, benadryl.  Anticipate discharge with clindamycin oral. No evidence of SIRS/sepsis, so far work up reassuring.   1345: Notified by RN patient walked out.  I have sent prescription for clindamycin for abscess.  Blood cultures and wound cultures pending. Was not able to obtain second troponin prior to patient leaving.    Final Clinical Impression(s) / ED Diagnoses Final diagnoses:  Abscess  Atypical chest pain    Rx / DC Orders ED Discharge Orders         Ordered    clindamycin (CLEOCIN) 150 MG capsule  3 times daily        11/24/19 1335           Arlean Hopping 11/24/19 [place], [place], DO 11/24/19 1411

## 2019-11-25 ENCOUNTER — Other Ambulatory Visit: Payer: Self-pay

## 2019-11-25 ENCOUNTER — Emergency Department (HOSPITAL_COMMUNITY): Payer: Medicare Other

## 2019-11-25 ENCOUNTER — Inpatient Hospital Stay (HOSPITAL_COMMUNITY)
Admission: EM | Admit: 2019-11-25 | Discharge: 2019-12-02 | DRG: 580 | Discharge status: Against Medical Advice | Payer: Medicare Other | Attending: Internal Medicine | Admitting: Internal Medicine

## 2019-11-25 ENCOUNTER — Telehealth (HOSPITAL_BASED_OUTPATIENT_CLINIC_OR_DEPARTMENT_OTHER): Payer: Self-pay | Admitting: Emergency Medicine

## 2019-11-25 ENCOUNTER — Encounter (HOSPITAL_COMMUNITY): Payer: Self-pay

## 2019-11-25 DIAGNOSIS — J449 Chronic obstructive pulmonary disease, unspecified: Secondary | ICD-10-CM | POA: Diagnosis present

## 2019-11-25 DIAGNOSIS — Z95810 Presence of automatic (implantable) cardiac defibrillator: Secondary | ICD-10-CM

## 2019-11-25 DIAGNOSIS — F1120 Opioid dependence, uncomplicated: Secondary | ICD-10-CM | POA: Diagnosis present

## 2019-11-25 DIAGNOSIS — Z951 Presence of aortocoronary bypass graft: Secondary | ICD-10-CM

## 2019-11-25 DIAGNOSIS — R7881 Bacteremia: Secondary | ICD-10-CM | POA: Diagnosis not present

## 2019-11-25 DIAGNOSIS — I252 Old myocardial infarction: Secondary | ICD-10-CM

## 2019-11-25 DIAGNOSIS — E876 Hypokalemia: Secondary | ICD-10-CM

## 2019-11-25 DIAGNOSIS — L0291 Cutaneous abscess, unspecified: Secondary | ICD-10-CM

## 2019-11-25 DIAGNOSIS — Z20822 Contact with and (suspected) exposure to covid-19: Secondary | ICD-10-CM | POA: Diagnosis present

## 2019-11-25 DIAGNOSIS — E7800 Pure hypercholesterolemia, unspecified: Secondary | ICD-10-CM | POA: Diagnosis present

## 2019-11-25 DIAGNOSIS — E785 Hyperlipidemia, unspecified: Secondary | ICD-10-CM | POA: Diagnosis present

## 2019-11-25 DIAGNOSIS — N183 Chronic kidney disease, stage 3 unspecified: Secondary | ICD-10-CM | POA: Diagnosis present

## 2019-11-25 DIAGNOSIS — I129 Hypertensive chronic kidney disease with stage 1 through stage 4 chronic kidney disease, or unspecified chronic kidney disease: Secondary | ICD-10-CM | POA: Diagnosis present

## 2019-11-25 DIAGNOSIS — K219 Gastro-esophageal reflux disease without esophagitis: Secondary | ICD-10-CM | POA: Diagnosis present

## 2019-11-25 DIAGNOSIS — I38 Endocarditis, valve unspecified: Secondary | ICD-10-CM | POA: Diagnosis present

## 2019-11-25 DIAGNOSIS — R21 Rash and other nonspecific skin eruption: Secondary | ICD-10-CM | POA: Diagnosis not present

## 2019-11-25 DIAGNOSIS — B9689 Other specified bacterial agents as the cause of diseases classified elsewhere: Secondary | ICD-10-CM | POA: Diagnosis present

## 2019-11-25 DIAGNOSIS — I447 Left bundle-branch block, unspecified: Secondary | ICD-10-CM | POA: Diagnosis present

## 2019-11-25 DIAGNOSIS — L02414 Cutaneous abscess of left upper limb: Principal | ICD-10-CM | POA: Diagnosis present

## 2019-11-25 DIAGNOSIS — I110 Hypertensive heart disease with heart failure: Secondary | ICD-10-CM | POA: Diagnosis present

## 2019-11-25 DIAGNOSIS — Z8673 Personal history of transient ischemic attack (TIA), and cerebral infarction without residual deficits: Secondary | ICD-10-CM

## 2019-11-25 DIAGNOSIS — R072 Precordial pain: Secondary | ICD-10-CM | POA: Diagnosis not present

## 2019-11-25 DIAGNOSIS — B1920 Unspecified viral hepatitis C without hepatic coma: Secondary | ICD-10-CM | POA: Diagnosis present

## 2019-11-25 DIAGNOSIS — R079 Chest pain, unspecified: Secondary | ICD-10-CM | POA: Diagnosis not present

## 2019-11-25 DIAGNOSIS — I2510 Atherosclerotic heart disease of native coronary artery without angina pectoris: Secondary | ICD-10-CM | POA: Diagnosis present

## 2019-11-25 DIAGNOSIS — G4733 Obstructive sleep apnea (adult) (pediatric): Secondary | ICD-10-CM | POA: Diagnosis present

## 2019-11-25 DIAGNOSIS — F17210 Nicotine dependence, cigarettes, uncomplicated: Secondary | ICD-10-CM | POA: Diagnosis present

## 2019-11-25 DIAGNOSIS — I5042 Chronic combined systolic (congestive) and diastolic (congestive) heart failure: Secondary | ICD-10-CM | POA: Diagnosis present

## 2019-11-25 DIAGNOSIS — I493 Ventricular premature depolarization: Secondary | ICD-10-CM | POA: Diagnosis present

## 2019-11-25 DIAGNOSIS — Z8249 Family history of ischemic heart disease and other diseases of the circulatory system: Secondary | ICD-10-CM

## 2019-11-25 DIAGNOSIS — Z85118 Personal history of other malignant neoplasm of bronchus and lung: Secondary | ICD-10-CM

## 2019-11-25 DIAGNOSIS — I255 Ischemic cardiomyopathy: Secondary | ICD-10-CM | POA: Diagnosis present

## 2019-11-25 DIAGNOSIS — Z5329 Procedure and treatment not carried out because of patient's decision for other reasons: Secondary | ICD-10-CM | POA: Diagnosis present

## 2019-11-25 DIAGNOSIS — F1990 Other psychoactive substance use, unspecified, uncomplicated: Secondary | ICD-10-CM

## 2019-11-25 DIAGNOSIS — Z825 Family history of asthma and other chronic lower respiratory diseases: Secondary | ICD-10-CM

## 2019-11-25 LAB — BLOOD CULTURE ID PANEL (REFLEXED) - BCID2

## 2019-11-25 LAB — CBC WITH DIFFERENTIAL/PLATELET
Abs Immature Granulocytes: 0.09 10*3/uL — ABNORMAL HIGH (ref 0.00–0.07)
Basophils Absolute: 0 10*3/uL (ref 0.0–0.1)
Basophils Relative: 0 %
Eosinophils Absolute: 0 10*3/uL (ref 0.0–0.5)
Eosinophils Relative: 0 %
HCT: 46.2 % (ref 39.0–52.0)
Hemoglobin: 14.8 g/dL (ref 13.0–17.0)
Immature Granulocytes: 1 %
Lymphocytes Relative: 12 %
Lymphs Abs: 2.1 10*3/uL (ref 0.7–4.0)
MCH: 26.6 pg (ref 26.0–34.0)
MCHC: 32 g/dL (ref 30.0–36.0)
MCV: 82.9 fL (ref 80.0–100.0)
Monocytes Absolute: 0.6 10*3/uL (ref 0.1–1.0)
Monocytes Relative: 3 %
Neutro Abs: 14.7 10*3/uL — ABNORMAL HIGH (ref 1.7–7.7)
Neutrophils Relative %: 84 %
Platelets: 354 10*3/uL (ref 150–400)
RBC: 5.57 MIL/uL (ref 4.22–5.81)
RDW: 15.2 % (ref 11.5–15.5)
WBC: 17.5 10*3/uL — ABNORMAL HIGH (ref 4.0–10.5)
nRBC: 0 % (ref 0.0–0.2)

## 2019-11-25 LAB — COMPREHENSIVE METABOLIC PANEL
ALT: 15 U/L (ref 0–44)
AST: 20 U/L (ref 15–41)
Albumin: 2.9 g/dL — ABNORMAL LOW (ref 3.5–5.0)
Alkaline Phosphatase: 257 U/L — ABNORMAL HIGH (ref 38–126)
Anion gap: 16 — ABNORMAL HIGH (ref 5–15)
BUN: 16 mg/dL (ref 6–20)
CO2: 20 mmol/L — ABNORMAL LOW (ref 22–32)
Calcium: 9.5 mg/dL (ref 8.9–10.3)
Chloride: 99 mmol/L (ref 98–111)
Creatinine, Ser: 0.88 mg/dL (ref 0.61–1.24)
GFR calc Af Amer: >60 mL/min (ref >60)
GFR calc non Af Amer: >60 mL/min (ref >60)
Glucose, Bld: 125 mg/dL — ABNORMAL HIGH (ref 70–99)
Potassium: 3.1 mmol/L — ABNORMAL LOW (ref 3.5–5.1)
Sodium: 135 mmol/L (ref 135–145)
Total Bilirubin: 0.8 mg/dL (ref 0.3–1.2)
Total Protein: 9.2 g/dL — ABNORMAL HIGH (ref 6.5–8.1)

## 2019-11-25 LAB — TROPONIN I (HIGH SENSITIVITY)
Troponin I (High Sensitivity): 11 ng/L (ref <18)
Troponin I (High Sensitivity): 9 ng/L (ref <18)

## 2019-11-25 LAB — LACTIC ACID, PLASMA
Lactic Acid, Venous: 1.8 mmol/L (ref 0.5–1.9)
Lactic Acid, Venous: 1.8 mmol/L (ref 0.5–1.9)

## 2019-11-25 MED ORDER — LIDOCAINE VISCOUS HCL 2 % MT SOLN
15.0000 mL | Freq: Once | OROMUCOSAL | Status: AC
  Administered 2019-11-26: 15 mL via ORAL
  Filled 2019-11-25: qty 15

## 2019-11-25 MED ORDER — LIDOCAINE-EPINEPHRINE 1 %-1:100000 IJ SOLN
10.0000 mL | Freq: Once | INTRAMUSCULAR | Status: AC
  Administered 2019-11-25: 10 mL via INTRADERMAL
  Filled 2019-11-25: qty 1

## 2019-11-25 MED ORDER — SODIUM CHLORIDE 0.9 % IV SOLN
2.0000 g | Freq: Three times a day (TID) | INTRAVENOUS | Status: DC
  Administered 2019-11-25 – 2019-12-02 (×20): 2 g via INTRAVENOUS
  Filled 2019-11-25 (×20): qty 2

## 2019-11-25 MED ORDER — ALUM & MAG HYDROXIDE-SIMETH 200-200-20 MG/5ML PO SUSP
30.0000 mL | Freq: Once | ORAL | Status: AC
  Administered 2019-11-26: 30 mL via ORAL
  Filled 2019-11-25: qty 30

## 2019-11-25 MED ORDER — SODIUM CHLORIDE 0.9 % IV SOLN: 2.0000 g | Freq: Once | INTRAVENOUS | Status: DC

## 2019-11-25 MED ORDER — OXYCODONE-ACETAMINOPHEN 5-325 MG PO TABS
2.0000 | ORAL_TABLET | Freq: Once | ORAL | Status: AC
  Administered 2019-11-25: 2 via ORAL
  Filled 2019-11-25: qty 2

## 2019-11-25 MED ORDER — POTASSIUM CHLORIDE 10 MEQ/100ML IV SOLN
10.0000 meq | INTRAVENOUS | Status: DC
  Administered 2019-11-25: 10 meq via INTRAVENOUS
  Filled 2019-11-25: qty 100

## 2019-11-25 MED ORDER — ONDANSETRON HCL 4 MG/2ML IJ SOLN
4.0000 mg | Freq: Once | INTRAMUSCULAR | Status: AC
  Administered 2019-11-25: 4 mg via INTRAVENOUS
  Filled 2019-11-25: qty 2

## 2019-11-25 MED ORDER — LACTATED RINGERS IV BOLUS
500.0000 mL | Freq: Once | INTRAVENOUS | Status: AC
  Administered 2019-11-25: 500 mL via INTRAVENOUS

## 2019-11-25 MED ORDER — BUPRENORPHINE HCL-NALOXONE HCL 2-0.5 MG SL SUBL
2.0000 | SUBLINGUAL_TABLET | SUBLINGUAL | Status: AC | PRN
  Administered 2019-11-25 – 2019-11-26 (×3): 2 via SUBLINGUAL
  Filled 2019-11-25 (×3): qty 2

## 2019-11-25 MED ORDER — BUPRENORPHINE HCL-NALOXONE HCL 8-2 MG SL SUBL
1.0000 | SUBLINGUAL_TABLET | Freq: Two times a day (BID) | SUBLINGUAL | Status: DC
  Administered 2019-11-26: 1 via SUBLINGUAL
  Filled 2019-11-25: qty 1

## 2019-11-25 MED ORDER — VANCOMYCIN HCL 1500 MG/300ML IV SOLN: 1500.0000 mg | Freq: Once | INTRAVENOUS | Status: DC

## 2019-11-25 NOTE — ED Notes (Signed)
RN unable to start IV due to IV drug use.  Provided now attempting to start IV.

## 2019-11-25 NOTE — ED Notes (Signed)
Patient presented to ED entrance complaining of throbbing pain 10/10 pain starting yesterday morning.  Stated presented to [facility] to drain Left AC abscess.

## 2019-11-25 NOTE — ED Triage Notes (Signed)
Emergency Medicine Provider Triage Evaluation Note  John Davenport , a 44 y.o. male  was evaluated in triage.  Pt complains of Chest pain, anxiety, opioid withdrawal.  Patient reports that he has been feeling really bad for about 6 days now.  While patient was in the emergency room I received a call from [place] who is attempting to contact patient with positive blood cultures and saw that he was in our waiting room.  I called patient from the waiting room.  He reports he feels very anxious currently along with chest pain.  He attributes his anxiety to withdrawal.    Informed patient of his positive blood cultures.  [place] MD Dr. Sabra Heck and charge nurse made aware that patient has positive blood cultures.  Review of Systems  Positive: Chest pain, fatigue Negative: Recent trauma.   Physical Exam  BP 133/73 (BP Location: Right Arm)   Pulse 79   Temp 97.7 F (36.5 C) (Oral)   Resp (!) 22   SpO2 97%  Gen:   Awake, very anxious, jittery, irritable HEENT:  Atraumatic  Skin: multiple wounds consistent with tack marks on arms/hands.  Resp:  Normal effort  Cardiac:  Normal rate  Neuro:  Speech clear, alert, able to answer questions.   Medical Decision Making  Medically screening exam initiated at 8:16 PM.  Appropriate orders placed.  [name] was informed that the remainder of the evaluation will be completed by another provider, this initial triage assessment does not replace that evaluation, and the importance of remaining in the ED until their evaluation is complete.  Patient walked out of med Center yesterday before his work-up and evaluation was complete. He had his abscess drained yesterday in the ER.   I informed him of his positive blood cultures along with notifying charge nurse.  Basic labs ordered.    Clinical Impression  Bacteremia   Lorin Glass, Hershal Coria 11/25/19 2025

## 2019-11-25 NOTE — Social Work (Signed)
CSW met with Pt at bedside. Pt states that he is feeling sick due to withdrawals from heroin use. Last use 2 days ago. CSW and Pt discussed  clean needle usage as well as 12-step and other recovery otions. CSW provided resources for GSTOP and Narcotics Anonymous. CSW discussed overdose with Pt, but Pt was agitated and did not wish to engage. Pt thanked CSW for resources.

## 2019-11-25 NOTE — ED Provider Notes (Signed)
[facility] EMERGENCY DEPARTMENT Provider Note   CSN: [id number] Arrival date & time: 11/25/19  1818     History Chief Complaint  Patient presents with  . Chest Pain    John Davenport is a 44 y.o. male with past medical history of polysubstance abuse, lung cancer, ischemic cardiomyopathy with last EF 20%, endocarditis status post explantation of ICD presents to the ED with complaint of chest pain, nausea, vomiting, malaise.  Patient states unable to tolerate anything to eat for last 6 days for the symptoms.  Describes his chest pain as an indigestion that is substernal, nonradiating and nonexertional.  Endorses last heroin use 2 days ago, has been taking Suboxone off the street most recently at noon today.  Patient states that symptoms similar to prior heart attacks.  Patient presented for similar complaints yesterday, notably had a left AC abscess which was drained.  Blood cultures were obtained and on review of these the patient had Serratia and Enterobacterales in both bottles.  The history is provided by the patient.  Chest Pain Pain location:  Epigastric and substernal area Pain quality: aching   Pain radiates to:  Does not radiate Pain severity:  Moderate Onset quality:  Gradual Duration:  1 week Timing:  Constant Progression:  Worsening Chronicity:  New Context: not movement   Associated symptoms: fatigue, nausea, shortness of breath and vomiting   Associated symptoms: no abdominal pain, no cough, no dizziness, no fever, no headache and no palpitations     HPI: A 44 year old patient with a history of hypertension presents for evaluation of chest pain. Initial onset of pain was more than 6 hours ago. The patient's chest pain is described as heaviness/pressure/tightness and is not worse with exertion. The patient complains of nausea. The patient's chest pain is middle- or left-sided, is not well-localized, is not sharp and does not radiate to the  arms/jaw/neck. The patient denies diaphoresis. The patient has a family history of coronary artery disease in a first-degree relative with onset less than age 11. The patient has no history of stroke, has no history of peripheral artery disease, has not smoked in the past 90 days, denies any history of treated diabetes, has no history of hypercholesterolemia and does not have an elevated BMI (>=30).   Past Medical History:  Diagnosis Date  . Anxiety state, unspecified   . Back pain   . Cancer ([place])    Lung CA  . Chronic systolic heart failure ([place])    a. Ischemic CM (EF 30% by echo in 2011) b. echo 08/2015: EF 20% with severe HK and AK of the inferoseptal and apical myocardium.  . Coronary artery disease    a. 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  b. eventually required single vessel CABG (L-LAD) in 03/2009 due to restenosis. c. cath 2013 showed patent LIMA sequential to the diags with LAD filling retrograde, no new disease in RCA and Cx, low LVEDP, sx felt noncardiac. d. 08/2015: NSTEMI: CTO of prox LAD, 99% stenosis of small dCx (no stent), + for cocaine  . Esophageal reflux   . Heart attack ([place])   . History of hepatitis C virus infection 07/15/2018   07/20/18 Viral RNA negative suggest past, cleared infection  . Hypercholesteremia   . ICD (implantable cardioverter-defibrillator) infection ([place])    a. [place] Sci ICD 12/2008.   . Ischemic cardiomyopathy    echocardiogram 12/11: Mild LVH, EF 30-35%, anteroseptal and  apical akinesis, grade 1 diastolic dysfunction  . LBP (low back pain)   . OSA (obstructive sleep apnea) 12/30/2010  . Paroxysmal ventricular tachycardia ([place])   . Stroke ([place])   . Tobacco abuse     Patient Active Problem List   Diagnosis Date Noted  . Pressure ulcer of sacral region, stage 2 ([place]) 07/22/2018  . AKI (acute kidney injury) ([place])   . History of hepatitis C virus infection 07/15/2018  . Hyponatremia 07/15/2018  .  Thrombocytopenia ([place]) 07/14/2018  . ICD (implantable cardioverter-defibrillator) infection ([place]) 07/13/2018  . Opioid use disorder, severe, in early remission ([place]) 07/13/2018  . Anaphylaxis 07/12/2018  . Bacteremia due to methicillin susceptible Staphylococcus aureus (MSSA) 07/12/2018  . MSSA bacteremia 07/12/2018  . Chest pain 07/11/2018  . Encounter for therapeutic drug monitoring 06/09/2016  . Embolic stroke ([place]) 09/81/1914  . Hypotension 05/23/2016  . Smokeless tobacco use 05/23/2016  . Cocaine abuse ([place]) 05/23/2016  . Cerebrovascular accident (CVA) due to thrombosis of left middle cerebral artery (HCC) s/p IV tPA and mechanical thrombectomy   . Chronic systolic congestive heart failure ([place])   . Cough with sputum 04/17/2016  . Intractable upper abdominal pain 03/09/2016  . CKD (chronic kidney disease), stage III ([place]) 03/09/2016  . 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 RUQ abdominal pain 03/09/2016  . NSTEMI (non-ST elevated myocardial infarction) ([place]) 09/05/2015  . Ischemic cardiomyopathy 09/05/2015  . Tobacco abuse 09/05/2015  . Hyperlipidemia 09/05/2015  . Renal insufficiency 09/05/2015  . OSA (obstructive sleep apnea) 12/30/2010  . Cough 12/09/2010  . Dental abscess 12/09/2010  . GERD 04/10/2010  . PAROXYSMAL VENTRICULAR TACHYCARDIA 03/22/2010  . Chronic combined systolic and diastolic CHF (congestive heart failure) ([place]) 03/22/2010  . Generalized anxiety disorder 05/16/2009  . MYOCARDIAL INFARCTION 12/19/2008  . CAD in native artery 12/19/2008    Past Surgical History:  Procedure Laterality Date  . CARDIAC CATHETERIZATION N/A 09/06/2015   Procedure: Left Heart Cath and Cors/Grafts Angiography;  Surgeon: Sherren Mocha, MD;  Location: [facility];  Service: Cardiovascular;  Laterality: N/A;  . CARDIAC DEFIBRILLATOR PLACEMENT    . CARDIAC DEFIBRILLATOR PLACEMENT     2010 by JA  . CORONARY ANGIOPLASTY WITH STENT PLACEMENT     LAD stenting 10/09 and  re-opening 6/10  . CORONARY ARTERY BYPASS GRAFT     1/11  . ICD LEAD REMOVAL Left 07/16/2018   Procedure: ICD LEAD REMOVAL;  Surgeon: Evans Lance, MD;  Location: [facility];  Service: Cardiovascular;  Laterality: Left;  [place] back up  . IR GENERIC HISTORICAL  05/21/2016   IR PERCUTANEOUS ART THROMBECTOMY/INFUSION INTRACRANIAL INC DIAG ANGIO 05/21/2016 Luanne Bras, MD [facility]  . IR GENERIC HISTORICAL  06/16/2016   IR RADIOLOGIST EVAL & MGMT 06/16/2016 [facility]  . LEFT HEART CATHETERIZATION WITH CORONARY ANGIOGRAM N/A 08/07/2011   Procedure: LEFT HEART CATHETERIZATION WITH CORONARY ANGIOGRAM;  Surgeon: Josue Hector, MD;  Location: [facility] CATH [facility];  Service: Cardiovascular;  Laterality: N/A;  . RADIOLOGY WITH ANESTHESIA N/A 05/21/2016   Procedure: RADIOLOGY WITH ANESTHESIA;  Surgeon: Luanne Bras, MD;  Location: [facility];  Service: Radiology;  Laterality: N/A;  . RIGHT/LEFT HEART CATH AND CORONARY ANGIOGRAPHY N/A 02/03/2018   Procedure: RIGHT/LEFT HEART CATH AND CORONARY ANGIOGRAPHY;  Surgeon: Larey Dresser, MD;  Location: [facility] CV LAB;  Service: Cardiovascular;  Laterality: N/A;  . TEE WITHOUT CARDIOVERSION N/A 07/16/2018   Procedure: TRANSESOPHAGEAL ECHOCARDIOGRAM (TEE);  Surgeon: Evans Lance, MD;  Location: [facility];  Service:  Cardiovascular;  Laterality: N/A;       Family History  Problem Relation Age of Onset  . Heart attack Father 55       died at 58 after multiple MI's  . COPD Maternal Grandmother     Social History   Tobacco Use  . Smoking status: Current Every Day Smoker    Packs/day: 0.50    Years: 20.00    Pack years: 10.00    Types: Cigarettes  . Smokeless tobacco: Former Systems developer    Types: Chew  . Tobacco comment: smoke 0.5 per week  Vaping Use  . Vaping Use: Never used  Substance Use Topics  . Alcohol use: No  . Drug use: Yes    Types: Cocaine    Home Medications Prior to Admission medications   Medication Sig Start Date End Date Taking?  Authorizing Provider  amiodarone (PACERONE) 200 MG tablet Take 1 tablet (200 mg total) by mouth 2 (two) times daily for 30 days. 07/29/18 08/28/18  Sid Falcon, MD  carvedilol (COREG) 6.25 MG tablet Take 1 tablet (6.25 mg total) by mouth 2 (two) times daily for 30 days. 07/29/18 08/28/18  Sid Falcon, MD  clindamycin (CLEOCIN) 150 MG capsule Take 3 capsules (450 mg total) by mouth 3 (three) times daily for 7 days. 11/24/19 12/01/19  Kinnie Feil, PA-C  digoxin (LANOXIN) 0.125 MG tablet Take 1 tablet (125 mcg total) by mouth daily for 30 days. 07/29/18 11/25/19  Sid Falcon, MD  EPINEPHrine 0.3 mg/0.3 mL IJ SOAJ injection Inject 0.3 mLs (0.3 mg total) into the muscle as needed for anaphylaxis. 07/12/18   Rehman, Areeg N, DO  furosemide (LASIX) 40 MG tablet Take 1 tablet (40 mg total) by mouth 2 (two) times daily for 30 days. 07/29/18 11/25/19  Sid Falcon, MD  losartan (COZAAR) 25 MG tablet Take 1 tablet (25 mg total) by mouth 2 (two) times daily. 07/29/18 11/25/19  Sid Falcon, MD  nitroGLYCERIN (NITROSTAT) 0.4 MG SL tablet Place 1 tablet (0.4 mg total) under the tongue every 5 (five) minutes as needed for up to 30 days for chest pain. 07/29/18 11/25/19  Sid Falcon, MD  ondansetron (ZOFRAN-ODT) 4 MG disintegrating tablet Take 1 tablet (4 mg total) by mouth every 6 (six) hours as needed for nausea or vomiting. 07/29/18   Sid Falcon, MD  Rivaroxaban (XARELTO) 15 MG TABS tablet Take 1 tablet (15 mg total) by mouth daily with supper for 30 days. Restart on Thursday morning. 07/29/18 11/25/19  Sid Falcon, MD  spironolactone (ALDACTONE) 25 MG tablet Take 1 tablet (25 mg total) by mouth at bedtime. 07/29/18   Sid Falcon, MD    Allergies    Patient has no known allergies.  Review of Systems   Review of Systems  Constitutional: Positive for chills and fatigue. Negative for fever.  HENT: Negative for facial swelling and voice change.   Eyes: Negative for redness and visual  disturbance.  Respiratory: Positive for shortness of breath. Negative for cough.   Cardiovascular: Positive for chest pain. Negative for palpitations.  Gastrointestinal: Positive for diarrhea, nausea and vomiting. Negative for abdominal pain.  Genitourinary: Negative for difficulty urinating and dysuria.  Musculoskeletal: Negative for gait problem and joint swelling.  Skin: Positive for wound. Negative for rash.  Neurological: Negative for dizziness and headaches.  Psychiatric/Behavioral: Negative for confusion and suicidal ideas.     Physical Exam Updated Vital Signs BP 131/80   Pulse 78  Temp 97.7 F (36.5 C) (Oral)   Resp (!) 28   SpO2 99%   Physical Exam Constitutional:      General: He is in acute distress.     Appearance: He is ill-appearing.  HENT:     Head: Normocephalic and atraumatic.     Mouth/Throat:     Mouth: Mucous membranes are moist.     Pharynx: Oropharynx is clear.  Eyes:     General: No scleral icterus.    Pupils: Pupils are equal, round, and reactive to light.  Cardiovascular:     Rate and Rhythm: Normal rate and regular rhythm.     Pulses: Normal pulses.          Carotid pulses are 2+ on the right side and 2+ on the left side.      Radial pulses are 2+ on the right side and 2+ on the left side.       Dorsalis pedis pulses are 2+ on the right side and 2+ on the left side.       Posterior tibial pulses are 2+ on the right side and 2+ on the left side.     Heart sounds: Normal heart sounds.  Pulmonary:     Effort: Pulmonary effort is normal. No respiratory distress.  Abdominal:     General: There is no distension.     Tenderness: There is no abdominal tenderness.  Musculoskeletal:        General: No tenderness or deformity.     Cervical back: Normal range of motion and neck supple.     Comments: fluctulent mass to the left AC, prior incision closed with no drainage.  Neurological:     General: No focal deficit present.     Mental Status: He is  alert and oriented to person, place, and time.  Psychiatric:        Mood and Affect: Mood normal.        Behavior: Behavior normal.      ED Results / Procedures / Treatments   Labs (all labs ordered are listed, but only abnormal results are displayed) Labs Reviewed  COMPREHENSIVE METABOLIC PANEL - Abnormal; Notable for the following components:      Result Value   Potassium 3.1 (*)    CO2 20 (*)    Glucose, Bld 125 (*)    Total Protein 9.2 (*)    Albumin 2.9 (*)    Alkaline Phosphatase 257 (*)    Anion gap 16 (*)    All other components within normal limits  CBC WITH DIFFERENTIAL/PLATELET - Abnormal; Notable for the following components:   WBC 17.5 (*)    Neutro Abs 14.7 (*)    Abs Immature Granulocytes 0.09 (*)    All other components within normal limits  CULTURE, BLOOD (ROUTINE X 2)  CULTURE, BLOOD (ROUTINE X 2)  CULTURE, BLOOD (SINGLE)  SARS CORONAVIRUS 2 BY RT PCR (HOSPITAL ORDER, [facility])  LACTIC ACID, PLASMA  LACTIC ACID, PLASMA  TROPONIN I (HIGH SENSITIVITY)  TROPONIN I (HIGH SENSITIVITY)    EKG EKG Interpretation  Date/Time:  Friday November 25 2019 18:28:53 EDT Ventricular Rate:  84 PR Interval:  156 QRS Duration: 112 QT Interval:  416 QTC Calculation: 491 R Axis:   126 Text Interpretation: Sinus rhythm with frequent Premature ventricular complexes Right atrial enlargement Indeterminate axis Minimal voltage criteria for LVH, may be normal variant ( Cornell product ) Lateral infarct , age undetermined Abnormal ECG unchanged Confirmed  by Noemi Chapel [phone]) on 11/25/2019 6:36:21 PM   Radiology DG Chest 2 View  Result Date: 11/25/2019 CLINICAL DATA:  44 year old male with chest pain. EXAM: CHEST - 2 VIEW COMPARISON:  Chest radiograph dated 11/24/2019. FINDINGS: There is no focal consolidation, pleural effusion or pneumothorax. The cardiac silhouette is within limits. Median sternotomy wires and CABG vascular clips. No acute  osseous pathology. IMPRESSION: No active cardiopulmonary disease. Electronically Signed   By: Anner Crete M.D.   On: 11/25/2019 19:23   DG Chest Port 1 View  Result Date: 11/24/2019 CLINICAL DATA:  Left chest pain EXAM: PORTABLE CHEST 1 VIEW COMPARISON:  07/17/2018 FINDINGS: Postop CABG. Heart size upper normal. Mild vascular congestion without edema or effusion. No focal infiltrate. No acute skeletal abnormality. IMPRESSION: Prominent heart size with mild vascular congestion. Negative for edema. Electronically Signed   By: Franchot Gallo M.D.   On: 11/24/2019 10:59    Procedures .Marland KitchenIncision and Drainage  Date/Time: 11/25/2019 10:58 PM Performed by: Renold Genta, MD Authorized by: Noemi Chapel, MD   Consent:    Consent obtained:  Verbal   Consent given by:  Patient   Risks discussed:  Bleeding, incomplete drainage, damage to other organs, infection and pain   Alternatives discussed:  No treatment, delayed treatment and alternative treatment Location:    Type:  Abscess   Size:  6x4cm   Location:  Upper extremity   Upper extremity location:  Elbow   Elbow location:  L elbow Pre-procedure details:    Skin preparation:  Chloraprep Anesthesia (see MAR for exact dosages):    Anesthesia method:  Local infiltration   Local anesthetic:  Lidocaine 2% WITH epi Procedure type:    Complexity:  Simple Procedure details:    Incision types:  Single straight   Incision depth:  Dermal   Scalpel blade:  11   Wound management:  Probed and deloculated, irrigated with saline and extensive cleaning   Drainage:  Purulent   Drainage amount:  Copious   Wound treatment:  Wound left open   Packing materials:  1/4 in iodoform gauze   Amount 1/4" iodoform:  4 Post-procedure details:    Patient tolerance of procedure:  Tolerated well, no immediate complications    Medications Ordered in ED Medications  buprenorphine-naloxone (SUBOXONE) 2-0.5 mg per SL tablet 2 tablet (2 tablets Sublingual  Given 11/25/19 2218)  buprenorphine-naloxone (SUBOXONE) 8-2 mg per SL tablet 1 tablet (has no administration in time range)  ceFEPIme (MAXIPIME) 2 g in sodium chloride 0.9 % 100 mL IVPB (0 g Intravenous Stopped 11/25/19 2323)  potassium chloride 10 mEq in 100 mL IVPB (10 mEq Intravenous New Bag/Given 11/25/19 2352)  lactated ringers bolus 500 mL (0 mLs Intravenous Stopped 11/25/19 2351)  lidocaine-EPINEPHrine (XYLOCAINE W/EPI) 1 %-1:100000 (with pres) injection 10 mL (10 mLs Intradermal Given by Other 11/25/19 2110)  ondansetron (ZOFRAN) injection 4 mg (4 mg Intravenous Given 11/25/19 2111)  oxyCODONE-acetaminophen (PERCOCET/ROXICET) 5-325 MG per tablet 2 tablet (2 tablets Oral Given 11/25/19 2346)  alum & mag hydroxide-simeth (MAALOX/MYLANTA) 200-200-20 MG/5ML suspension 30 mL (30 mLs Oral Given 11/26/19 0003)    And  lidocaine (XYLOCAINE) 2 % viscous mouth solution 15 mL (15 mLs Oral Given 11/26/19 0003)    ED Course  I have reviewed the triage vital signs and the nursing notes.  Pertinent labs & imaging results that were available during my care of the patient were reviewed by me and considered in my medical decision making (see chart for details).  MDM Rules/Calculators/A&P HEAR Score: 5                       EKG findings by my read: Compared to prior: 11/24/2019.  Rate: 84 rhythm: sinus with frequent PACs Axis: appropriate  PR: 156 QRS: 112 QTc: 491.  Poor R wave progression with frequent PVCs, no gross evidence of ischemia or arrhythmia.  Differential diagnosis considered: ACS, CHF exacerbation, PE, pneumonia, pneumothorax, sepsis, bacteremia, endocarditis  Patient presents for atypical chest pain though has a long and complicated history of ACS and ischemic cardiomyopathy as well as a history of IV drug abuse.  Has an obvious abscess to his left [facility] which was drained yesterday but unfortunately is not still draining with reaccumulation of purulent fluid.  Of greater concern is the fact of the  patient's blood cultures yesterday have grown back positive for Klebsiella and Enterobacterales species confirming bacteremia with a high suspicion for endocarditis patient the patient's history.  Chest pain work-up initiated though previously performed yesterday and reassuringly negative.  EKG unremarkable, quick point-of-care ultrasound performed myself showing no evidence of tamponade or right heart strain, grossly dilated left ventricle consistent with prior studies with poor heart squeeze.  We will measure 1 L of fluids, symptomatic management for opioid withdrawal with Suboxone per protocol as well as Zofran.  Case discussed with pharmacy who agreed for cefepime monotherapy based on results of blood cultures.  Left AC abscess drained as above.  Troponin negative x2, lactic acid negative x2, WBC elevated at 17.5 concerning for active infection either abscess or endocarditis/bacteremia patient not grossly septic.  Given his CHF history we will not administer any more than the 1 L of fluids.  Will replete potassium IV, the remainder of chemistries unremarkable.  Hospital medicine consulted for admission with patient to be admitted to their service.   Final Clinical Impression(s) / ED Diagnoses Final diagnoses:  Bacteremia  Cutaneous abscess of left upper extremity  Substernal chest pain    Rx / DC Orders ED Discharge Orders    None     Labs, studies and imaging reviewed by myself and considered in medical decision making if ordered. Imaging interpreted by radiology. Pt was discussed with my attending, Dr. Sabra Heck  Electronically signed by:  Roderic Palau [place]8/28/202112:20 AM       Renold Genta, MD 11/26/19 Caprice Beaver, MD 11/28/19 1120

## 2019-11-25 NOTE — ED Triage Notes (Signed)
Pt presents with indigestion, mid-sternum CP, pt states "it feels like I'm having a panic attack"

## 2019-11-26 ENCOUNTER — Inpatient Hospital Stay (HOSPITAL_COMMUNITY): Payer: Medicare Other

## 2019-11-26 DIAGNOSIS — I255 Ischemic cardiomyopathy: Secondary | ICD-10-CM | POA: Diagnosis not present

## 2019-11-26 DIAGNOSIS — F17210 Nicotine dependence, cigarettes, uncomplicated: Secondary | ICD-10-CM | POA: Diagnosis present

## 2019-11-26 DIAGNOSIS — G4733 Obstructive sleep apnea (adult) (pediatric): Secondary | ICD-10-CM | POA: Diagnosis present

## 2019-11-26 DIAGNOSIS — I5042 Chronic combined systolic (congestive) and diastolic (congestive) heart failure: Secondary | ICD-10-CM

## 2019-11-26 DIAGNOSIS — I447 Left bundle-branch block, unspecified: Secondary | ICD-10-CM | POA: Diagnosis present

## 2019-11-26 DIAGNOSIS — N183 Chronic kidney disease, stage 3 unspecified: Secondary | ICD-10-CM | POA: Diagnosis present

## 2019-11-26 DIAGNOSIS — I2510 Atherosclerotic heart disease of native coronary artery without angina pectoris: Secondary | ICD-10-CM | POA: Diagnosis not present

## 2019-11-26 DIAGNOSIS — E7800 Pure hypercholesterolemia, unspecified: Secondary | ICD-10-CM | POA: Diagnosis present

## 2019-11-26 DIAGNOSIS — I38 Endocarditis, valve unspecified: Secondary | ICD-10-CM | POA: Diagnosis present

## 2019-11-26 DIAGNOSIS — F1120 Opioid dependence, uncomplicated: Secondary | ICD-10-CM | POA: Diagnosis present

## 2019-11-26 DIAGNOSIS — L0291 Cutaneous abscess, unspecified: Secondary | ICD-10-CM | POA: Diagnosis not present

## 2019-11-26 DIAGNOSIS — R079 Chest pain, unspecified: Secondary | ICD-10-CM | POA: Diagnosis present

## 2019-11-26 DIAGNOSIS — I129 Hypertensive chronic kidney disease with stage 1 through stage 4 chronic kidney disease, or unspecified chronic kidney disease: Secondary | ICD-10-CM | POA: Diagnosis present

## 2019-11-26 DIAGNOSIS — Z20822 Contact with and (suspected) exposure to covid-19: Secondary | ICD-10-CM | POA: Diagnosis present

## 2019-11-26 DIAGNOSIS — I110 Hypertensive heart disease with heart failure: Secondary | ICD-10-CM | POA: Diagnosis present

## 2019-11-26 DIAGNOSIS — B9689 Other specified bacterial agents as the cause of diseases classified elsewhere: Secondary | ICD-10-CM | POA: Diagnosis present

## 2019-11-26 DIAGNOSIS — R7881 Bacteremia: Secondary | ICD-10-CM | POA: Diagnosis not present

## 2019-11-26 DIAGNOSIS — I340 Nonrheumatic mitral (valve) insufficiency: Secondary | ICD-10-CM | POA: Diagnosis not present

## 2019-11-26 DIAGNOSIS — E876 Hypokalemia: Secondary | ICD-10-CM | POA: Diagnosis present

## 2019-11-26 DIAGNOSIS — M7989 Other specified soft tissue disorders: Secondary | ICD-10-CM | POA: Diagnosis not present

## 2019-11-26 DIAGNOSIS — I361 Nonrheumatic tricuspid (valve) insufficiency: Secondary | ICD-10-CM | POA: Diagnosis not present

## 2019-11-26 DIAGNOSIS — F1990 Other psychoactive substance use, unspecified, uncomplicated: Secondary | ICD-10-CM

## 2019-11-26 DIAGNOSIS — R600 Localized edema: Secondary | ICD-10-CM | POA: Diagnosis not present

## 2019-11-26 DIAGNOSIS — K219 Gastro-esophageal reflux disease without esophagitis: Secondary | ICD-10-CM | POA: Diagnosis present

## 2019-11-26 DIAGNOSIS — J449 Chronic obstructive pulmonary disease, unspecified: Secondary | ICD-10-CM | POA: Diagnosis present

## 2019-11-26 DIAGNOSIS — B1920 Unspecified viral hepatitis C without hepatic coma: Secondary | ICD-10-CM | POA: Diagnosis present

## 2019-11-26 DIAGNOSIS — Z5329 Procedure and treatment not carried out because of patient's decision for other reasons: Secondary | ICD-10-CM | POA: Diagnosis present

## 2019-11-26 DIAGNOSIS — L02414 Cutaneous abscess of left upper limb: Secondary | ICD-10-CM | POA: Diagnosis not present

## 2019-11-26 DIAGNOSIS — E785 Hyperlipidemia, unspecified: Secondary | ICD-10-CM | POA: Diagnosis present

## 2019-11-26 DIAGNOSIS — I493 Ventricular premature depolarization: Secondary | ICD-10-CM | POA: Diagnosis present

## 2019-11-26 LAB — ECHOCARDIOGRAM COMPLETE
Calc EF: 16.6 %
S' Lateral: 6.3 cm
Single Plane A2C EF: 16.8 %
Single Plane A4C EF: 14 %

## 2019-11-26 LAB — CBC
HCT: 42.5 % (ref 39.0–52.0)
Hemoglobin: 13.9 g/dL (ref 13.0–17.0)
MCH: 27.1 pg (ref 26.0–34.0)
MCHC: 32.7 g/dL (ref 30.0–36.0)
MCV: 82.8 fL (ref 80.0–100.0)
Platelets: 421 10*3/uL — ABNORMAL HIGH (ref 150–400)
RBC: 5.13 MIL/uL (ref 4.22–5.81)
RDW: 15.2 % (ref 11.5–15.5)
WBC: 17 10*3/uL — ABNORMAL HIGH (ref 4.0–10.5)
nRBC: 0 % (ref 0.0–0.2)

## 2019-11-26 LAB — BASIC METABOLIC PANEL
Anion gap: 14 (ref 5–15)
BUN: 15 mg/dL (ref 6–20)
CO2: 20 mmol/L — ABNORMAL LOW (ref 22–32)
Calcium: 9.1 mg/dL (ref 8.9–10.3)
Chloride: 102 mmol/L (ref 98–111)
Creatinine, Ser: 0.84 mg/dL (ref 0.61–1.24)
GFR calc Af Amer: >60 mL/min (ref >60)
GFR calc non Af Amer: >60 mL/min (ref >60)
Glucose, Bld: 104 mg/dL — ABNORMAL HIGH (ref 70–99)
Potassium: 3.4 mmol/L — ABNORMAL LOW (ref 3.5–5.1)
Sodium: 136 mmol/L (ref 135–145)

## 2019-11-26 LAB — HIV ANTIBODY (ROUTINE TESTING W REFLEX): HIV Screen 4th Generation wRfx: NONREACTIVE

## 2019-11-26 LAB — SARS CORONAVIRUS 2 BY RT PCR (HOSPITAL ORDER, PERFORMED IN ~~LOC~~ HOSPITAL LAB): SARS Coronavirus 2: NEGATIVE

## 2019-11-26 MED ORDER — POTASSIUM CHLORIDE 10 MEQ/100ML IV SOLN
INTRAVENOUS | Status: AC
  Administered 2019-11-26: 10 meq
  Filled 2019-11-26: qty 100

## 2019-11-26 MED ORDER — LORAZEPAM 2 MG/ML IJ SOLN
1.0000 mg | Freq: Four times a day (QID) | INTRAMUSCULAR | Status: DC | PRN
  Administered 2019-11-26 – 2019-12-02 (×17): 1 mg via INTRAVENOUS
  Filled 2019-11-26 (×20): qty 1

## 2019-11-26 MED ORDER — CLONIDINE HCL 0.1 MG PO TABS
0.1000 mg | ORAL_TABLET | Freq: Four times a day (QID) | ORAL | Status: AC
  Administered 2019-11-26 – 2019-11-27 (×8): 0.1 mg via ORAL
  Filled 2019-11-26 (×8): qty 1

## 2019-11-26 MED ORDER — CLONIDINE HCL 0.1 MG PO TABS
0.1000 mg | ORAL_TABLET | ORAL | Status: AC
  Administered 2019-11-28 – 2019-11-29 (×3): 0.1 mg via ORAL
  Filled 2019-11-26 (×3): qty 1

## 2019-11-26 MED ORDER — DICYCLOMINE HCL 20 MG PO TABS
20.0000 mg | ORAL_TABLET | Freq: Four times a day (QID) | ORAL | Status: AC | PRN
  Administered 2019-11-26 – 2019-11-29 (×11): 20 mg via ORAL
  Filled 2019-11-26 (×11): qty 1

## 2019-11-26 MED ORDER — LOPERAMIDE HCL 2 MG PO CAPS
2.0000 mg | ORAL_CAPSULE | ORAL | Status: AC | PRN
  Administered 2019-11-26 – 2019-11-28 (×6): 2 mg via ORAL
  Administered 2019-11-28: 4 mg via ORAL
  Filled 2019-11-26 (×5): qty 1
  Filled 2019-11-26: qty 2
  Filled 2019-11-26: qty 1

## 2019-11-26 MED ORDER — METHOCARBAMOL 500 MG PO TABS
500.0000 mg | ORAL_TABLET | Freq: Three times a day (TID) | ORAL | Status: AC | PRN
  Administered 2019-11-26 – 2019-11-29 (×8): 500 mg via ORAL
  Filled 2019-11-26 (×9): qty 1

## 2019-11-26 MED ORDER — NAPROXEN 250 MG PO TABS
500.0000 mg | ORAL_TABLET | Freq: Two times a day (BID) | ORAL | Status: AC | PRN
  Administered 2019-11-26 – 2019-12-01 (×7): 500 mg via ORAL
  Filled 2019-11-26 (×9): qty 2

## 2019-11-26 MED ORDER — HYDROXYZINE HCL 25 MG PO TABS
25.0000 mg | ORAL_TABLET | Freq: Four times a day (QID) | ORAL | Status: AC | PRN
  Administered 2019-11-26 – 2019-11-29 (×8): 25 mg via ORAL
  Filled 2019-11-26 (×9): qty 1

## 2019-11-26 MED ORDER — BUPRENORPHINE HCL-NALOXONE HCL 8-2 MG SL SUBL
1.0000 | SUBLINGUAL_TABLET | Freq: Two times a day (BID) | SUBLINGUAL | Status: DC
  Administered 2019-11-26 – 2019-12-02 (×12): 1 via SUBLINGUAL
  Filled 2019-11-26 (×13): qty 1

## 2019-11-26 MED ORDER — CLONIDINE HCL 0.1 MG PO TABS
0.1000 mg | ORAL_TABLET | Freq: Every day | ORAL | Status: AC
  Administered 2019-11-30 – 2019-12-01 (×2): 0.1 mg via ORAL
  Filled 2019-11-26 (×2): qty 1

## 2019-11-26 MED ORDER — ONDANSETRON HCL 4 MG/2ML IJ SOLN
4.0000 mg | Freq: Four times a day (QID) | INTRAMUSCULAR | Status: DC | PRN
  Administered 2019-11-26 – 2019-11-30 (×12): 4 mg via INTRAVENOUS
  Filled 2019-11-26 (×13): qty 2

## 2019-11-26 MED ORDER — ENOXAPARIN SODIUM 40 MG/0.4ML ~~LOC~~ SOLN
40.0000 mg | Freq: Every day | SUBCUTANEOUS | Status: DC
  Administered 2019-11-26 – 2019-12-02 (×7): 40 mg via SUBCUTANEOUS
  Filled 2019-11-26 (×7): qty 0.4

## 2019-11-26 MED ORDER — POTASSIUM CHLORIDE CRYS ER 20 MEQ PO TBCR
20.0000 meq | EXTENDED_RELEASE_TABLET | Freq: Two times a day (BID) | ORAL | Status: DC
  Administered 2019-11-26 – 2019-11-27 (×3): 20 meq via ORAL
  Filled 2019-11-26 (×3): qty 1

## 2019-11-26 NOTE — H&P (Signed)
History and Physical    John Davenport KNL:[id number] DOB: Jan 20, 1976 DOA: 11/25/2019  PCP: Patient, No Pcp Per  Patient coming from: Home  I have personally briefly reviewed patient's old medical records in [place]  Chief Complaint: persistent chest pain  HPI: John Davenport is a 44 y.o. male with medical history significant for  IV drug use, paroxysmal ventricular tachycardia with history of ICD placement and removal in 06/2018 due to MSSA bacteremia with vegetation on device, CAD s/p P CABG x5, OSA not on CPAP, combined systolic and diastolic heart failure, CVA, CKD stage III, hepatitis C who presents with concerns of chest pain.  Notes ongoing constant mid-sternal burning chest pain for the.  Has tried antiacid medication without relief.  Also has associated shortness of breath but thinks he is having panic attack.  This feels similar to past MI.  He also has been having nausea and vomiting as well as diarrhea starting today.  Thinks he has fever.  Has not eaten in 5 days.  Reports IV heroin use 2 days ago.  Denies cocaine or alcohol use.  Patient presented with similar symptoms of chest pain on 8/26 to the ED.  He was noted to have a troponin of 11 with no ischemic changes seen on EKG.  Had mild leukocytosis of 16.5 but was afebrile without tachycardia.  He was also noted to have left upper extremity abscess that was drained in the ED.  There was plans for admission for IV antibiotics but patient left AMA.  Also  had nursing documentation that he tried to hide syringes and lidocaine in his belongings. Blood culture obtained at that time later now positive for Serratia and Enterobacterules.   ED Course: He was afebrile, tachypneic normotensive on room air.  CBC notable for leukocytosis of 17.5.  Lactic acid of 1.8.  Potassium of 3.1, glucose of 125, normal creatinine of 0.88.  Anion gap 16. Troponin of 11 and 9.  Frequent PVCs.  Negative chest x-ray.  Review of  Systems:  Constitutional: No Weight Change, No Fever ENT/Mouth: No sore throat, No Rhinorrhea Eyes: No Eye Pain, No Vision Changes Cardiovascular: + Chest Pain, + SOB Respiratory: No Cough, No Sputum, No Wheezing, no Dyspnea  Gastrointestinal: No Nausea, No Vomiting, + Diarrhea, No Constipation, No Pain Genitourinary: no Urinary Incontinence Musculoskeletal: No Arthralgias, No Myalgias Skin: No Skin Lesions, No Pruritus, Neuro: no Weakness, No Numbness Psych: No Anxiety/Panic, No Depression, + decrease appetite Heme/Lymph: No Bruising, No Bleeding  Past Medical History:  Diagnosis Date  . Anxiety state, unspecified   . Back pain   . Cancer ([place])    Lung CA  . Chronic systolic heart failure ([place])    a. Ischemic CM (EF 30% by echo in 2011) b. echo 08/2015: EF 20% with severe HK and AK of the inferoseptal and apical myocardium.  . Coronary artery disease    a. 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  b. eventually required single vessel CABG (L-LAD) in 03/2009 due to restenosis. c. cath 2013 showed patent LIMA sequential to the diags with LAD filling retrograde, no new disease in RCA and Cx, low LVEDP, sx felt noncardiac. d. 08/2015: NSTEMI: CTO of prox LAD, 99% stenosis of small dCx (no stent), + for cocaine  . Esophageal reflux   . Heart attack ([place])   . History of hepatitis C virus infection 07/15/2018   07/20/18 Viral RNA negative suggest past,  cleared infection  . Hypercholesteremia   . ICD (implantable cardioverter-defibrillator) infection ([place])    a. [place] Sci ICD 12/2008.   . Ischemic cardiomyopathy    echocardiogram 12/11: Mild LVH, EF 30-35%, anteroseptal and apical akinesis, grade 1 diastolic dysfunction  . LBP (low back pain)   . OSA (obstructive sleep apnea) 12/30/2010  . Paroxysmal ventricular tachycardia ([place])   . Stroke ([place])   . Tobacco abuse     Past Surgical History:  Procedure Laterality Date  . CARDIAC CATHETERIZATION  N/A 09/06/2015   Procedure: Left Heart Cath and Cors/Grafts Angiography;  Surgeon: Sherren Mocha, MD;  Location: [facility];  Service: Cardiovascular;  Laterality: N/A;  . CARDIAC DEFIBRILLATOR PLACEMENT    . CARDIAC DEFIBRILLATOR PLACEMENT     2010 by JA  . CORONARY ANGIOPLASTY WITH STENT PLACEMENT     LAD stenting 10/09 and re-opening 6/10  . CORONARY ARTERY BYPASS GRAFT     1/11  . ICD LEAD REMOVAL Left 07/16/2018   Procedure: ICD LEAD REMOVAL;  Surgeon: Evans Lance, MD;  Location: [facility];  Service: Cardiovascular;  Laterality: Left;  [place] back up  . IR GENERIC HISTORICAL  05/21/2016   IR PERCUTANEOUS ART THROMBECTOMY/INFUSION INTRACRANIAL INC DIAG ANGIO 05/21/2016 Luanne Bras, MD [facility]  . IR GENERIC HISTORICAL  06/16/2016   IR RADIOLOGIST EVAL & MGMT 06/16/2016 [facility]  . LEFT HEART CATHETERIZATION WITH CORONARY ANGIOGRAM N/A 08/07/2011   Procedure: LEFT HEART CATHETERIZATION WITH CORONARY ANGIOGRAM;  Surgeon: Josue Hector, MD;  Location: [facility] CATH LAB;  Service: Cardiovascular;  Laterality: N/A;  . RADIOLOGY WITH ANESTHESIA N/A 05/21/2016   Procedure: RADIOLOGY WITH ANESTHESIA;  Surgeon: Luanne Bras, MD;  Location: [facility];  Service: Radiology;  Laterality: N/A;  . RIGHT/LEFT HEART CATH AND CORONARY ANGIOGRAPHY N/A 02/03/2018   Procedure: RIGHT/LEFT HEART CATH AND CORONARY ANGIOGRAPHY;  Surgeon: Larey Dresser, MD;  Location: [facility] CV LAB;  Service: Cardiovascular;  Laterality: N/A;  . TEE WITHOUT CARDIOVERSION N/A 07/16/2018   Procedure: TRANSESOPHAGEAL ECHOCARDIOGRAM (TEE);  Surgeon: Evans Lance, MD;  Location: [facility];  Service: Cardiovascular;  Laterality: N/A;     reports that he has been smoking cigarettes. He has a 10.00 pack-year smoking history. He has quit using smokeless tobacco.  His smokeless tobacco use included chew. He reports current drug use. Drug: Cocaine. He reports that he does not drink alcohol. Social History  No Known  Allergies  Family History  Problem Relation Age of Onset  . Heart attack Father 1       died at 60 after multiple MI's  . COPD Maternal Grandmother      Prior to Admission medications   Medication Sig Start Date End Date Taking? Authorizing Provider  amiodarone (PACERONE) 200 MG tablet Take 1 tablet (200 mg total) by mouth 2 (two) times daily for 30 days. 07/29/18 08/28/18  Sid Falcon, MD  carvedilol (COREG) 6.25 MG tablet Take 1 tablet (6.25 mg total) by mouth 2 (two) times daily for 30 days. 07/29/18 11/26/19  Sid Falcon, MD  clindamycin (CLEOCIN) 150 MG capsule Take 3 capsules (450 mg total) by mouth 3 (three) times daily for 7 days. 11/24/19 12/01/19  Kinnie Feil, PA-C  digoxin (LANOXIN) 0.125 MG tablet Take 1 tablet (125 mcg total) by mouth daily for 30 days. 07/29/18 11/25/19  Sid Falcon, MD  EPINEPHrine 0.3 mg/0.3 mL IJ SOAJ injection Inject 0.3 mLs (0.3 mg total) into the muscle as needed  for anaphylaxis. 07/12/18   Rehman, Areeg N, DO  furosemide (LASIX) 40 MG tablet Take 1 tablet (40 mg total) by mouth 2 (two) times daily for 30 days. 07/29/18 11/25/19  Sid Falcon, MD  losartan (COZAAR) 25 MG tablet Take 1 tablet (25 mg total) by mouth 2 (two) times daily. 07/29/18 11/25/19  Sid Falcon, MD  nitroGLYCERIN (NITROSTAT) 0.4 MG SL tablet Place 1 tablet (0.4 mg total) under the tongue every 5 (five) minutes as needed for up to 30 days for chest pain. 07/29/18 11/25/19  Sid Falcon, MD  ondansetron (ZOFRAN-ODT) 4 MG disintegrating tablet Take 1 tablet (4 mg total) by mouth every 6 (six) hours as needed for nausea or vomiting. 07/29/18   Sid Falcon, MD  Rivaroxaban (XARELTO) 15 MG TABS tablet Take 1 tablet (15 mg total) by mouth daily with supper for 30 days. Restart on Thursday morning. 07/29/18 11/25/19  Sid Falcon, MD  spironolactone (ALDACTONE) 25 MG tablet Take 1 tablet (25 mg total) by mouth at bedtime. 07/29/18   Sid Falcon, MD    Physical  Exam: Vitals:   11/25/19 2315 11/25/19 2330 11/25/19 2345 11/26/19 0000  BP: 137/86 (!) 133/91 130/85 131/80  Pulse: 92 78 86 78  Resp: (!) 39 (!) 32 (!) 27 (!) 28  Temp:      TempSrc:      SpO2: 100% 98% 99% 99%    Constitutional: NAD, calm, comfortable, nontoxic appearing young male laying flat in bed Vitals:   11/25/19 2315 11/25/19 2330 11/25/19 2345 11/26/19 0000  BP: 137/86 (!) 133/91 130/85 131/80  Pulse: 92 78 86 78  Resp: (!) 39 (!) 32 (!) 27 (!) 28  Temp:      TempSrc:      SpO2: 100% 98% 99% 99%   Eyes: PERRL, lids and conjunctivae normal ENMT: Mucous membranes are moist.  Poor dentition with staining of teeth.   Neck: normal, supple,  Respiratory: clear to auscultation bilaterally, no wheezing, no crackles. Normal respiratory effort. No accessory muscle use.  Cardiovascular: Regular rate and rhythm, no murmurs / rubs / gallops. No extremity edema. 2+ pedal pulses.  Abdomen: no tenderness, no masses palpated.Bowel sounds positive.  Musculoskeletal: no clubbing / cyanosis. No joint deformity upper and lower extremities. Good ROM, no contractures. Normal muscle tone.  Skin: New track marks noted to bilateral distal lower extremity.  No Janeway or Osler's nodes.  Left upper extremity wound wrapped in clean dressing. Neurologic: CN 2-12 grossly intact. Sensation intact. Strength 5/5 in all 4.  Psychiatric: Normal judgment and insight. Alert and oriented x 3. Normal mood.    Labs on Admission: I have personally reviewed following labs and imaging studies  CBC: Recent Labs  Lab 11/24/19 1113 11/25/19 2003  WBC 16.5* 17.5*  NEUTROABS  --  14.7*  HGB 13.8 14.8  HCT 43.9 46.2  MCV 85.9 82.9  PLT 331 096   Basic Metabolic Panel: Recent Labs  Lab 11/24/19 1113 11/25/19 2003  NA 135 135  K 3.4* 3.1*  CL 100 99  CO2 23 20*  GLUCOSE 105* 125*  BUN 15 16  CREATININE 0.86 0.88  CALCIUM 9.0 9.5   GFR: Estimated Creatinine Clearance: 111 mL/min (by C-G formula  based on SCr of 0.88 mg/dL). Liver Function Tests: Recent Labs  Lab 11/25/19 2003  AST 20  ALT 15  ALKPHOS 257*  BILITOT 0.8  PROT 9.2*  ALBUMIN 2.9*   No results for input(s): LIPASE, AMYLASE  in the last 168 hours. No results for input(s): AMMONIA in the last 168 hours. Coagulation Profile: No results for input(s): INR, PROTIME in the last 168 hours. Cardiac Enzymes: No results for input(s): CKTOTAL, CKMB, CKMBINDEX, TROPONINI in the last 168 hours. BNP (last 3 results) No results for input(s): PROBNP in the last 8760 hours. HbA1C: No results for input(s): HGBA1C in the last 72 hours. CBG: No results for input(s): GLUCAP in the last 168 hours. Lipid Profile: No results for input(s): CHOL, HDL, LDLCALC, TRIG, CHOLHDL, LDLDIRECT in the last 72 hours. Thyroid Function Tests: No results for input(s): TSH, T4TOTAL, FREET4, T3FREE, THYROIDAB in the last 72 hours. Anemia Panel: No results for input(s): VITAMINB12, FOLATE, FERRITIN, TIBC, IRON, RETICCTPCT in the last 72 hours. Urine analysis:    Component Value Date/Time   COLORURINE YELLOW 05/21/2016 1853   APPEARANCEUR CLEAR 05/21/2016 1853   LABSPEC 1.026 05/21/2016 1853   PHURINE 5.0 05/21/2016 1853   GLUCOSEU NEGATIVE 05/21/2016 1853   HGBUR NEGATIVE 05/21/2016 1853   BILIRUBINUR NEGATIVE 05/21/2016 1853   KETONESUR NEGATIVE 05/21/2016 1853   PROTEINUR NEGATIVE 05/21/2016 1853   UROBILINOGEN 0.2 04/09/2009 1132   NITRITE NEGATIVE 05/21/2016 1853   LEUKOCYTESUR NEGATIVE 05/21/2016 1853    Radiological Exams on Admission: DG Chest 2 View  Result Date: 11/25/2019 CLINICAL DATA:  44 year old male with chest pain. EXAM: CHEST - 2 VIEW COMPARISON:  Chest radiograph dated 11/24/2019. FINDINGS: There is no focal consolidation, pleural effusion or pneumothorax. The cardiac silhouette is within limits. Median sternotomy wires and CABG vascular clips. No acute osseous pathology. IMPRESSION: No active cardiopulmonary disease.  Electronically Signed   By: Anner Crete M.D.   On: 11/25/2019 19:23   DG Chest Port 1 View  Result Date: 11/24/2019 CLINICAL DATA:  Left chest pain EXAM: PORTABLE CHEST 1 VIEW COMPARISON:  07/17/2018 FINDINGS: Postop CABG. Heart size upper normal. Mild vascular congestion without edema or effusion. No focal infiltrate. No acute skeletal abnormality. IMPRESSION: Prominent heart size with mild vascular congestion. Negative for edema. Electronically Signed   By: Franchot Gallo M.D.   On: 11/24/2019 10:59      Assessment/Plan  Chest pain in the setting of bacteremia with IV drug use and hx of CAD s/p CABG  Troponin downward trending from 11 to 9. EKG with frequent PVCs. Low suspicion for ischemic changes.  However high suspicion of endocarditis with bacteremia and IV drug use obtain Echo  continue IV antibiotics  Bacteremia with ongoing IV drug use/left upper extremity Pt seen in ED on 8/26 and left AMA.  Blood cultures grew positive for Serratia and Enterobacterules Continue IV cefepime per pharmacy  Left upper extremity abscess I&D done drained on 8/26 and during this admission Continue antibiotics  Hypokalemia repleted. Repeat BMP in the morning  Combined systolic and diastolic heart failure EF of 20 to 25% on echocardiogram 06/2018 Patient has history of ICD but this was removed in April 2020 due to vegetation for MSSA bacteremia.  Deemed by EP to not be a candidate for replacement due to continuous IV drug use  IV drug abuse Endorse heroin use 2 days ago COWS assessment continue Suboxone daily for withdrawal symptoms   DVT prophylaxis:.Lovenox Code Status: Full Family Communication: Plan discussed with patient at bedside  disposition Plan: Home with at least 2 midnight stays  Consults called:  Admission status: inpatient  Status is: Inpatient  Remains inpatient appropriate because:Inpatient level of care appropriate due to severity of illness   Dispo: The  patient is from: Home              Anticipated d/c is to: Home              Anticipated d/c date is: > 3 days              Patient currently is not medically stable to d/c.         Orene Desanctis DO [facility]   If 7PM-7AM, please contact night-coverage [email or web address]   11/26/2019, 1:11 AM

## 2019-11-26 NOTE — Progress Notes (Signed)
Patient seen and examined.  Admitted by nighttime hospitalist early morning hours with extensive history as below. 44 year old gentleman with IV heroin use, paroxysmal ventricular tachycardia with history of ICD placement and removal in 06/2018 due to MSSA bacteremia with vegetation on device and ongoing IV drug use, coronary artery disease status post CABG, sleep apnea not on CPAP, CKD stage III and hepatitis C who presented initially 2 days ago with concern about retrosternal chest pain and worried about heart attack.  He was seen in the ER with negative ischemic work-up and discharged home.  He continues to have midsternal burning chest pain and not relieved by antacid medications, shortness of breath, not eating for 5 days so came back to the ER.  When he came back to the ER, his records also showed that his blood cultures were positive for Serratia that were drawn on 8/26 visit to the hospital.  Patient admitted with pleuritic pain/gram-negative bacteremia/IV heroin use and withdrawal Patient seen and examined.  He was very focused on not adequately controlled symptoms.  Continues to complain of retrosternal pain and difficulty swallowing.  Wants something by IV.  Wants me to take care of his drug addiction and withdrawal symptoms.  He wants to quit once he gets out of the hospital and wants help.  Plan: Continue cefepime to cover for gram-negative bacteremia until final sensitivity. 2D echocardiogram pending. Repeat blood cultures drawn.   Has severe withdrawal symptoms, started on Suboxone 8/2 twice daily that he should continue. I will start him on clonidine withdrawal protocol with clonidine, muscle relaxants, nausea medications, NSAIDs and combination to help him get through this. Replace electrolytes.  Continue close monitoring of potassium magnesium and phosphorus with morning labs. High risk of leaving AMA.

## 2019-11-26 NOTE — Progress Notes (Signed)
Echocardiogram 2D Echocardiogram has been performed.  Oneal Deputy Chelcie Estorga 11/26/2019, 10:33 AM

## 2019-11-26 NOTE — TOC Initial Note (Addendum)
Transition of Care [facility]) - Initial/Assessment Note    Patient Details  Name: John Davenport MRN: [id number] Date of Birth: July 25, 1975  [facility] CM/SW Contact:    Verdell Carmine, RN Phone Number: 11/26/2019, 3:41 PM  Clinical Narrative:                 Patient with a hx of drug abuse comes in with feeling unwell blood cultures from 8/26 show bacteremia Serratia. Question a trasnmural clot on echo will need more in depth look EF 15-20%.  Will need IV abx.  Uninsured,will need to stay in hospital for treatment.  CM will l follow for needs.   Expected Discharge Plan: Home/Self Care Barriers to Discharge: Inadequate or no insurance   Patient Goals and CMS Choice        Expected Discharge Plan and Services Expected Discharge Plan: Home/Self Care   Discharge Planning Services: CM Consult   Living arrangements for the past 2 months: Single Family Home                                      Prior Living Arrangements/Services Living arrangements for the past 2 months: Single Family Home   Patient language and need for interpreter reviewed:: Yes        Need for Family Participation in Patient Care: Yes (Comment) Care giver support system in place?: Yes (comment)   Criminal Activity/Legal Involvement Pertinent to Current Situation/Hospitalization: No - Comment as needed  Activities of Daily Living      Permission Sought/Granted      Share Information with NAME: Judeen Hammans Mother           Emotional Assessment       Orientation: : Oriented to Self, Oriented to Place, Oriented to  Time, Oriented to Situation Alcohol / Substance Use: Illicit Drugs Psych Involvement: No (comment)  Admission diagnosis:  Bacteremia [R78.81] Substernal chest pain [R07.2] Cutaneous abscess of left upper extremity [L02.414] Patient Active Problem List   Diagnosis Date Noted  . Bacteremia 11/26/2019  . Abscess 11/26/2019  . Hypokalemia 11/26/2019  . IV drug  user 11/26/2019  . Pressure ulcer of sacral region, stage 2 ([place]) 07/22/2018  . AKI (acute kidney injury) ([place])   . History of hepatitis C virus infection 07/15/2018  . Hyponatremia 07/15/2018  . Thrombocytopenia ([place]) 07/14/2018  . ICD (implantable cardioverter-defibrillator) infection ([place]) 07/13/2018  . Opioid use disorder, severe, in early remission ([place]) 07/13/2018  . Anaphylaxis 07/12/2018  . Bacteremia due to methicillin susceptible Staphylococcus aureus (MSSA) 07/12/2018  . MSSA bacteremia 07/12/2018  . Chest pain 07/11/2018  . Encounter for therapeutic drug monitoring 06/09/2016  . Embolic stroke ([place]) 92/01/9416  . Hypotension 05/23/2016  . Smokeless tobacco use 05/23/2016  . Cocaine abuse ([place]) 05/23/2016  . Cerebrovascular accident (CVA) due to thrombosis of left middle cerebral artery (HCC) s/p IV tPA and mechanical thrombectomy   . Chronic systolic congestive heart failure ([place])   . Cough with sputum 04/17/2016  . Intractable upper abdominal pain 03/09/2016  . CKD (chronic kidney disease), stage III ([place]) 03/09/2016  . 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 RUQ abdominal pain 03/09/2016  . NSTEMI (non-ST elevated myocardial infarction) ([place]) 09/05/2015  . Ischemic cardiomyopathy 09/05/2015  . Tobacco abuse 09/05/2015  . Hyperlipidemia 09/05/2015  . Renal insufficiency 09/05/2015  . OSA (obstructive sleep apnea) 12/30/2010  . Cough 12/09/2010  . Dental abscess  12/09/2010  . GERD 04/10/2010  . PAROXYSMAL VENTRICULAR TACHYCARDIA 03/22/2010  . Chronic combined systolic and diastolic CHF (congestive heart failure) ([place]) 03/22/2010  . Generalized anxiety disorder 05/16/2009  . MYOCARDIAL INFARCTION 12/19/2008  . CAD in native artery 12/19/2008   PCP:  Patient, No Pcp Per Pharmacy:   [place], [address] [place] [address] Phone: [phone] Fax: [place], [place] [address] [place] [address] Phone: [phone] Fax: [place] [address], [place] - [place] [address] [place] [place] [address] Phone: [phone] Fax: [phone]     Social Determinants of Health (SDOH) Interventions    Readmission Risk Interventions No flowsheet data found.

## 2019-11-26 NOTE — ED Notes (Signed)
2nd bag of K running at 81mls per hour. Pt c/o pain at IV site with K , NS going at 150 for comfort

## 2019-11-27 ENCOUNTER — Other Ambulatory Visit (HOSPITAL_COMMUNITY): Payer: Medicare Other

## 2019-11-27 LAB — CBC WITH DIFFERENTIAL/PLATELET
Abs Immature Granulocytes: 0.06 10*3/uL (ref 0.00–0.07)
Basophils Absolute: 0.1 10*3/uL (ref 0.0–0.1)
Basophils Relative: 1 %
Eosinophils Absolute: 0 10*3/uL (ref 0.0–0.5)
Eosinophils Relative: 0 %
HCT: 38.1 % — ABNORMAL LOW (ref 39.0–52.0)
Hemoglobin: 12.1 g/dL — ABNORMAL LOW (ref 13.0–17.0)
Immature Granulocytes: 0 %
Lymphocytes Relative: 32 %
Lymphs Abs: 4.4 10*3/uL — ABNORMAL HIGH (ref 0.7–4.0)
MCH: 26.4 pg (ref 26.0–34.0)
MCHC: 31.8 g/dL (ref 30.0–36.0)
MCV: 83 fL (ref 80.0–100.0)
Monocytes Absolute: 1 10*3/uL (ref 0.1–1.0)
Monocytes Relative: 7 %
Neutro Abs: 8.1 10*3/uL — ABNORMAL HIGH (ref 1.7–7.7)
Neutrophils Relative %: 60 %
Platelets: 377 10*3/uL (ref 150–400)
RBC: 4.59 MIL/uL (ref 4.22–5.81)
RDW: 15.6 % — ABNORMAL HIGH (ref 11.5–15.5)
WBC: 13.6 10*3/uL — ABNORMAL HIGH (ref 4.0–10.5)
nRBC: 0 % (ref 0.0–0.2)

## 2019-11-27 LAB — COMPREHENSIVE METABOLIC PANEL
ALT: 12 U/L (ref 0–44)
AST: 18 U/L (ref 15–41)
Albumin: 2.2 g/dL — ABNORMAL LOW (ref 3.5–5.0)
Alkaline Phosphatase: 152 U/L — ABNORMAL HIGH (ref 38–126)
Anion gap: 8 (ref 5–15)
BUN: 14 mg/dL (ref 6–20)
CO2: 23 mmol/L (ref 22–32)
Calcium: 8.8 mg/dL — ABNORMAL LOW (ref 8.9–10.3)
Chloride: 104 mmol/L (ref 98–111)
Creatinine, Ser: 0.98 mg/dL (ref 0.61–1.24)
GFR calc Af Amer: >60 mL/min (ref >60)
GFR calc non Af Amer: >60 mL/min (ref >60)
Glucose, Bld: 93 mg/dL (ref 70–99)
Potassium: 4.2 mmol/L (ref 3.5–5.1)
Sodium: 135 mmol/L (ref 135–145)
Total Bilirubin: 0.6 mg/dL (ref 0.3–1.2)
Total Protein: 6.8 g/dL (ref 6.5–8.1)

## 2019-11-27 LAB — CULTURE, BLOOD (ROUTINE X 2): Special Requests: ADEQUATE

## 2019-11-27 LAB — AEROBIC CULTURE W GRAM STAIN (SUPERFICIAL SPECIMEN)

## 2019-11-27 LAB — MAGNESIUM: Magnesium: 1.6 mg/dL — ABNORMAL LOW (ref 1.7–2.4)

## 2019-11-27 LAB — PHOSPHORUS: Phosphorus: 2.5 mg/dL (ref 2.5–4.6)

## 2019-11-27 MED ORDER — NICOTINE 14 MG/24HR TD PT24
14.0000 mg | MEDICATED_PATCH | Freq: Every day | TRANSDERMAL | Status: DC
  Administered 2019-11-28 – 2019-12-02 (×6): 14 mg via TRANSDERMAL
  Filled 2019-11-27 (×6): qty 1

## 2019-11-27 MED ORDER — PANTOPRAZOLE SODIUM 40 MG PO TBEC
40.0000 mg | DELAYED_RELEASE_TABLET | Freq: Two times a day (BID) | ORAL | Status: DC
  Administered 2019-11-27 – 2019-12-02 (×10): 40 mg via ORAL
  Filled 2019-11-27 (×10): qty 1

## 2019-11-27 MED ORDER — MAGNESIUM SULFATE 2 GM/50ML IV SOLN
2.0000 g | Freq: Once | INTRAVENOUS | Status: AC
  Administered 2019-11-27: 2 g via INTRAVENOUS
  Filled 2019-11-27: qty 50

## 2019-11-27 MED ORDER — SODIUM CHLORIDE 0.9 % IV SOLN: INTRAVENOUS | Status: DC

## 2019-11-27 NOTE — Progress Notes (Signed)
    [facility] has been requested to perform a transesophageal echocardiogram on 11/28/19 for bacteremia.  After careful review of history and examination, the risks and benefits of transesophageal echocardiogram have been explained including risks of esophageal damage, perforation (1:10,000 risk), bleeding, pharyngeal hematoma as well as other potential complications associated with conscious sedation including aspiration, arrhythmia, respiratory failure and death. Alternatives to treatment were discussed, questions were answered. Patient is willing to proceed.   Anticipate TEE 11/28/19 with Dr. Harrell Gave.  Roby Lofts, PA-C 11/27/2019 10:53 AM

## 2019-11-27 NOTE — Social Work (Signed)
  CSW met with patient to address the consult for SA resources. Patient confirmed that he was interested in treatment however was undecided on whether he wanted outpatient or inpatient facility. Patient stated that his mother is supportive and can assist with picking treatment.  Patient shared that he is aware that he must stay clean due to his health issues and is hoping that he is able to do this on his own however will look at his treatment options. CSW offered a listening ear while he discussed his journey. CSW provided patient with SA resources as well as safe exchange programs with locations.

## 2019-11-27 NOTE — Consult Note (Signed)
[facility]  Total days of antibiotics 4         Reason for Consult: serratia endocarditis   Referring Physician: Maylene Roes  Principal Problem:   Bacteremia Active Problems:   Chronic combined systolic and diastolic CHF (congestive heart failure) ([place])   Abscess   Hypokalemia   IV drug user    HPI: John Davenport is a 44 y.o. 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 Admitted on 8/27 for CP, N/V x 6 days but also has recent heroin use 2 days PTA, and takes street procured suboxone. HAd been to the ED on 8/26 for  left AC abscess- blood cx drawn at that time showed serratia. cx from [facility] wound growing GPC and GNR. He has been on cefepime to treat bacteremia, remained afebrile. TTE showed calcified mural thrombus which is new from prior echo in April 2020. Repeat blood cx are pending.  Past Medical History:  Diagnosis Date  . Anxiety state, unspecified   . Back pain   . Cancer ([place])    Lung CA  . Chronic systolic heart failure ([place])    a. Ischemic CM (EF 30% by echo in 2011) b. echo 08/2015: EF 20% with severe HK and AK of the inferoseptal and apical myocardium.  . Coronary artery disease    a. 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  b. eventually required single vessel CABG (L-LAD) in 03/2009 due to restenosis. c. cath 2013 showed patent LIMA sequential to the diags with LAD filling retrograde, no new disease in RCA and Cx, low LVEDP, sx felt noncardiac. d. 08/2015: NSTEMI: CTO of prox LAD, 99% stenosis of small dCx (no stent), + for cocaine  . Esophageal reflux   . Heart attack ([place])   . History of hepatitis C virus infection 07/15/2018   07/20/18 Viral RNA negative suggest past, cleared infection  . Hypercholesteremia   . ICD (implantable cardioverter-defibrillator) infection ([place])    a. [place] [employer] ICD 12/2008.    . Ischemic cardiomyopathy    echocardiogram 12/11: Mild LVH, EF 30-35%, anteroseptal and apical akinesis, grade 1 diastolic dysfunction  . LBP (low back pain)   . OSA (obstructive sleep apnea) 12/30/2010  . Paroxysmal ventricular tachycardia ([place])   . Stroke ([place])   . Tobacco abuse     Allergies: No Known Allergies   MEDICATIONS: . buprenorphine-naloxone  1 tablet Sublingual BID  . cloNIDine  0.1 mg Oral QID   Followed by  . [START ON 11/28/2019] cloNIDine  0.1 mg Oral BH-qamhs   Followed by  . [START ON 11/30/2019] cloNIDine  0.1 mg Oral QAC breakfast  . enoxaparin (LOVENOX) injection  40 mg Subcutaneous Daily  . pantoprazole  40 mg Oral BID AC    Social History   Tobacco Use  . Smoking status: Current Every Day Smoker    Packs/day: 0.50    Years: 20.00    Pack years: 10.00    Types: Cigarettes  . Smokeless tobacco: Former Systems developer    Types: Chew  . Tobacco comment: smoke 0.5 per week  Vaping Use  . Vaping Use: Never used  Substance Use Topics  . Alcohol use: No  . Drug use: Yes    Types: Cocaine    Family History  Problem Relation Age of Onset  . Heart attack  Father 32       died at 66 after multiple MI's  . COPD Maternal Grandmother     Review of Systems - Constitutional: positive for fever, chills, diaphoresis, activity change, appetite change, fatigue and unexpected weight change.  HENT: Negative for congestion, sore throat, rhinorrhea, sneezing, trouble swallowing and sinus pressure.  Eyes: Negative for photophobia and visual disturbance.  Respiratory: Negative for cough, chest tightness, shortness of breath, wheezing and stridor.  Cardiovascular: Negative for chest pain, palpitations and leg swelling.  Gastrointestinal: Negative for nausea, vomiting, abdominal pain, diarrhea, constipation, blood in stool, abdominal distention and anal bleeding.  Genitourinary: Negative for dysuria, hematuria, flank pain and difficulty urinating.  Musculoskeletal: Negative for  myalgias, back pain, joint swelling, arthralgias and gait problem.  Skin: positive wound. Negative for color change, pallor, rash   Neurological: Negative for dizziness, tremors, weakness and light-headedness.  Hematological: Negative for adenopathy. Does not bruise/bleed easily.  Psychiatric/Behavioral: Negative for behavioral problems, confusion, sleep disturbance, dysphoric mood, decreased concentration and agitation.    OBJECTIVE: Temp:  [97.7 F (36.5 C)-98.9 F (37.2 C)] 98.1 F (36.7 C) (08/29 1045) Pulse Rate:  [60-83] 69 (08/29 1045) Resp:  [16-23] 20 (08/29 1045) BP: (99-127)/(61-84) 99/61 (08/29 1045) SpO2:  [95 %-99 %] 99 % (08/29 1045) Weight:  [74.8 kg-75.7 kg] 75.7 kg (08/29 0433) Physical Exam  Constitutional: He is oriented to person, place, and time. He appears well-developed and well-nourished. No distress.  HENT:  Mouth/Throat: Oropharynx is clear and moist. No oropharyngeal exudate.  Cardiovascular: Normal rate, regular rhythm and normal heart sounds. Exam reveals no gallop and no friction rub.  No murmur heard.  Pulmonary/Chest: Effort normal and breath sounds normal. No respiratory distress. He has no wheezes.  Abdominal: Soft. Bowel sounds are normal. He exhibits no distension. There is no tenderness.  Ext: left ac wound and surrounding erythema Lymphadenopathy:  He has no cervical adenopathy.  Neurological: He is alert and oriented to person, place, and time.  Skin: Skin is warm and dry. No rash noted. No erythema.  Psychiatric: He has a normal mood and affect. His behavior is normal.     LABS: Results for orders placed or performed during the hospital encounter of 11/25/19 (from the past 48 hour(s))  Troponin I (High Sensitivity)     Status: None   Collection Time: 11/25/19  8:03 PM  Result Value Ref Range   Troponin I (High Sensitivity) 11 <18 ng/L    Comment: (NOTE) Elevated high sensitivity troponin I (hsTnI) values and significant  changes  across serial measurements may suggest ACS but many other  chronic and acute conditions are known to elevate hsTnI results.  Refer to the "Links" section for chest pain algorithms and additional  guidance. Performed at [facility], [place] [address] [place], [place] [address]   Comprehensive metabolic panel     Status: Abnormal   Collection Time: 11/25/19  8:03 PM  Result Value Ref Range   Sodium 135 135 - 145 mmol/L   Potassium 3.1 (L) 3.5 - 5.1 mmol/L   Chloride 99 98 - 111 mmol/L   CO2 20 (L) 22 - 32 mmol/L   Glucose, Bld 125 (H) 70 - 99 mg/dL    Comment: Glucose reference range applies only to samples taken after fasting for at least 8 hours.   BUN 16 6 - 20 mg/dL   Creatinine, Ser 0.88 0.61 - 1.24 mg/dL   Calcium 9.5 8.9 - 10.3 mg/dL   Total Protein 9.2 (  H) 6.5 - 8.1 g/dL   Albumin 2.9 (L) 3.5 - 5.0 g/dL   AST 20 15 - 41 U/L   ALT 15 0 - 44 U/L   Alkaline Phosphatase 257 (H) 38 - 126 U/L   Total Bilirubin 0.8 0.3 - 1.2 mg/dL   GFR calc non Af Amer >60 >60 mL/min   GFR calc Af Amer >60 >60 mL/min   Anion gap 16 (H) 5 - 15    Comment: Performed at [facility] [address] [place], [place] [address]  CBC with Differential     Status: Abnormal   Collection Time: 11/25/19  8:03 PM  Result Value Ref Range   WBC 17.5 (H) 4.0 - 10.5 K/uL   RBC 5.57 4.22 - 5.81 MIL/uL   Hemoglobin 14.8 13.0 - 17.0 g/dL   HCT 46.2 39 - 52 %   MCV 82.9 80.0 - 100.0 fL   MCH 26.6 26.0 - 34.0 pg   MCHC 32.0 30.0 - 36.0 g/dL   RDW 15.2 11.5 - 15.5 %   Platelets 354 150 - 400 K/uL   nRBC 0.0 0.0 - 0.2 %   Neutrophils Relative % 84 %   Neutro Abs 14.7 (H) 1.7 - 7.7 K/uL   Lymphocytes Relative 12 %   Lymphs Abs 2.1 0.7 - 4.0 K/uL   Monocytes Relative 3 %   Monocytes Absolute 0.6 0 - 1 K/uL   Eosinophils Relative 0 %   Eosinophils Absolute 0.0 0 - 0 K/uL   Basophils Relative 0 %   Basophils Absolute 0.0 0 - 0 K/uL   Immature Granulocytes 1 %   Abs Immature Granulocytes 0.09 (H)  0.00 - 0.07 K/uL    Comment: Performed at [facility] [address] [place], [place] [address]  Lactic acid, plasma     Status: None   Collection Time: 11/25/19  8:03 PM  Result Value Ref Range   Lactic Acid, Venous 1.8 0.5 - 1.9 mmol/L    Comment: Performed at [facility] [address], [place], [place] [address]  Blood culture (routine x 2)     Status: None (Preliminary result)   Collection Time: 11/25/19  9:23 PM   Specimen: BLOOD LEFT HAND  Result Value Ref Range   Specimen Description BLOOD LEFT HAND    Special Requests      BOTTLES DRAWN AEROBIC AND ANAEROBIC Blood Culture adequate volume   Culture      NO GROWTH < 12 HOURS Performed at [facility], [place] [address] [place], [place] [address]    Report Status PENDING   Lactic acid, plasma     Status: None   Collection Time: 11/25/19  9:24 PM  Result Value Ref Range   Lactic Acid, Venous 1.8 0.5 - 1.9 mmol/L    Comment: Performed at [facility], [place] [address] [place], [place] [address]  Troponin I (High Sensitivity)     Status: None   Collection Time: 11/25/19  9:24 PM  Result Value Ref Range   Troponin I (High Sensitivity) 9 <18 ng/L    Comment: (NOTE) Elevated high sensitivity troponin I (hsTnI) values and significant  changes across serial measurements may suggest ACS but many other  chronic and acute conditions are known to elevate hsTnI results.  Refer to the "Links" section for chest pain algorithms and additional  guidance. Performed at [facility], [place] [address], [place], [place] [address]  Culture, blood (single)     Status: None (Preliminary result)   Collection Time: 11/25/19  9:53 PM   Specimen: BLOOD  Result Value Ref Range   Specimen Description BLOOD SITE NOT SPECIFIED    Special Requests      BOTTLES DRAWN AEROBIC AND ANAEROBIC Blood Culture adequate volume   Culture  Setup Time      GRAM NEGATIVE RODS ANAEROBIC BOTTLE ONLY CRITICAL VALUE NOTED.  VALUE IS  CONSISTENT WITH PREVIOUSLY REPORTED AND CALLED VALUE. Performed at [facility], [place] [address] [place], [place] [address]    Culture GRAM NEGATIVE RODS    Report Status PENDING   SARS Coronavirus 2 by RT PCR (hospital order, performed in [facility] hospital lab) Nasopharyngeal Nasopharyngeal Swab     Status: None   Collection Time: 11/26/19 12:06 AM   Specimen: Nasopharyngeal Swab  Result Value Ref Range   SARS Coronavirus 2 NEGATIVE NEGATIVE    Comment: (NOTE) SARS-CoV-2 target nucleic acids are NOT DETECTED.  The SARS-CoV-2 RNA is generally detectable in upper and lower respiratory specimens during the acute phase of infection. The lowest concentration of SARS-CoV-2 viral copies this assay can detect is 250 copies / mL. A negative result does not preclude SARS-CoV-2 infection and should not be used as the sole basis for treatment or other patient management decisions.  A negative result may occur with improper specimen collection / handling, submission of specimen other than nasopharyngeal swab, presence of viral mutation(s) within the areas targeted by this assay, and inadequate number of viral copies (<250 copies / mL). A negative result must be combined with clinical observations, patient history, and epidemiological information.  Fact Sheet for Patients:   StrictlyIdeas.no  Fact Sheet for Healthcare Providers: BankingDealers.co.za  This test is not yet approved or  cleared by the [place] FDA and has been authorized for detection and/or diagnosis of SARS-CoV-2 by FDA under an Emergency Use Authorization (EUA).  This EUA will remain in effect (meaning this test can be used) for the duration of the COVID-19 declaration under Section 564(b)(1) of the Act, 21 U.S.C. section 360bbb-3(b)(1), unless the authorization is terminated or revoked sooner.  Performed at [facility], [place] [address], [place],  [place] [address]   HIV Antibody (routine testing w rflx)     Status: None   Collection Time: 11/26/19  5:39 AM  Result Value Ref Range   HIV Screen 4th Generation wRfx Non Reactive Non Reactive    Comment: Performed at [facility], [place] [address] [place], [place] [address]  Basic metabolic panel     Status: Abnormal   Collection Time: 11/26/19  5:39 AM  Result Value Ref Range   Sodium 136 135 - 145 mmol/L   Potassium 3.4 (L) 3.5 - 5.1 mmol/L   Chloride 102 98 - 111 mmol/L   CO2 20 (L) 22 - 32 mmol/L   Glucose, Bld 104 (H) 70 - 99 mg/dL    Comment: Glucose reference range applies only to samples taken after fasting for at least 8 hours.   BUN 15 6 - 20 mg/dL   Creatinine, Ser 0.84 0.61 - 1.24 mg/dL   Calcium 9.1 8.9 - 10.3 mg/dL   GFR calc non Af Amer >60 >60 mL/min   GFR calc Af Amer >60 >60 mL/min   Anion gap 14 5 - 15    Comment: Performed at [facility] [address] [place], [place] [address]  CBC  Status: Abnormal   Collection Time: 11/26/19  5:39 AM  Result Value Ref Range   WBC 17.0 (H) 4.0 - 10.5 K/uL   RBC 5.13 4.22 - 5.81 MIL/uL   Hemoglobin 13.9 13.0 - 17.0 g/dL   HCT 42.5 39 - 52 %   MCV 82.8 80.0 - 100.0 fL   MCH 27.1 26.0 - 34.0 pg   MCHC 32.7 30.0 - 36.0 g/dL   RDW 15.2 11.5 - 15.5 %   Platelets 421 (H) 150 - 400 K/uL   nRBC 0.0 0.0 - 0.2 %    Comment: Performed at [facility], [place] [address], [place], [place] [address]  Comprehensive metabolic panel     Status: Abnormal   Collection Time: 11/27/19  3:27 AM  Result Value Ref Range   Sodium 135 135 - 145 mmol/L   Potassium 4.2 3.5 - 5.1 mmol/L   Chloride 104 98 - 111 mmol/L   CO2 23 22 - 32 mmol/L   Glucose, Bld 93 70 - 99 mg/dL    Comment: Glucose reference range applies only to samples taken after fasting for at least 8 hours.   BUN 14 6 - 20 mg/dL   Creatinine, Ser 0.98 0.61 - 1.24 mg/dL   Calcium 8.8 (L) 8.9 - 10.3 mg/dL   Total Protein 6.8 6.5 - 8.1 g/dL   Albumin 2.2 (L) 3.5 -  5.0 g/dL   AST 18 15 - 41 U/L   ALT 12 0 - 44 U/L   Alkaline Phosphatase 152 (H) 38 - 126 U/L   Total Bilirubin 0.6 0.3 - 1.2 mg/dL   GFR calc non Af Amer >60 >60 mL/min   GFR calc Af Amer >60 >60 mL/min   Anion gap 8 5 - 15    Comment: Performed at [facility] [address] [place], [place] [address]  Magnesium     Status: Abnormal   Collection Time: 11/27/19  3:27 AM  Result Value Ref Range   Magnesium 1.6 (L) 1.7 - 2.4 mg/dL    Comment: Performed at [facility] [address] [place], [place] [address]  Phosphorus     Status: None   Collection Time: 11/27/19  3:27 AM  Result Value Ref Range   Phosphorus 2.5 2.5 - 4.6 mg/dL    Comment: Performed at [facility], [place] [address], [place], [place] [address]  CBC with Differential/Platelet     Status: Abnormal   Collection Time: 11/27/19  3:27 AM  Result Value Ref Range   WBC 13.6 (H) 4.0 - 10.5 K/uL   RBC 4.59 4.22 - 5.81 MIL/uL   Hemoglobin 12.1 (L) 13.0 - 17.0 g/dL   HCT 38.1 (L) 39 - 52 %   MCV 83.0 80.0 - 100.0 fL   MCH 26.4 26.0 - 34.0 pg   MCHC 31.8 30.0 - 36.0 g/dL   RDW 15.6 (H) 11.5 - 15.5 %   Platelets 377 150 - 400 K/uL   nRBC 0.0 0.0 - 0.2 %   Neutrophils Relative % 60 %   Neutro Abs 8.1 (H) 1.7 - 7.7 K/uL   Lymphocytes Relative 32 %   Lymphs Abs 4.4 (H) 0.7 - 4.0 K/uL   Monocytes Relative 7 %   Monocytes Absolute 1.0 0 - 1 K/uL   Eosinophils Relative 0 %   Eosinophils Absolute 0.0 0 - 0 K/uL   Basophils Relative 1 %   Basophils Absolute 0.1 0 - 0 K/uL  Immature Granulocytes 0 %   Abs Immature Granulocytes 0.06 0.00 - 0.07 K/uL    Comment: Performed at [facility], [place] [address], [place], [place] [address]    MICRO: 8/26 blood cx serratia IMAGING: DG Chest 2 View  Result Date: 11/25/2019 CLINICAL DATA:  44 year old male with chest pain. EXAM: CHEST - 2 VIEW COMPARISON:  Chest radiograph dated 11/24/2019. FINDINGS: There is no focal consolidation, pleural effusion or  pneumothorax. The cardiac silhouette is within limits. Median sternotomy wires and CABG vascular clips. No acute osseous pathology. IMPRESSION: No active cardiopulmonary disease. Electronically Signed   By: Anner Crete M.D.   On: 11/25/2019 19:23   ECHOCARDIOGRAM COMPLETE  Result Date: 11/26/2019    ECHOCARDIOGRAM REPORT   Patient Name:   John Davenport Date of Exam: 11/26/2019 Medical Rec #:  [id number]            Height:       75.0 in Accession #:    [id number]           Weight:       165.0 lb Date of Birth:  10-24-1975             BSA:          2.022 m Patient Age:    71 years             BP:           135/90 mmHg Patient Gender: M                    HR:           93 bpm. Exam Location:  Inpatient Procedure: 2D Echo, Color Doppler and Cardiac Doppler Indications:    R07.9* Chest pain, unspecified  History:        Patient has prior history of Echocardiogram examinations, most                 recent 07/16/2018. CHF, CAD; Risk Factors:IVDU, Sleep Apnea and                 Dyslipidemia. ICD Wire removed 07/16/18 due to infection.  Sonographer:    Raquel Sarna Senior RDCS Referring Phys: [id number] [place]  1. There is calcification seen in the apex of the LV. This could represent a calcified mural thrombus. Best seen on apical 4C views. Difficult to distinguish from myocardial trabeculation. Would recommend limited contrast echocardiogram to exclude thrombus. EF severely reduced. Septum into apex akinetic with severe global hypokinesis. Left ventricular ejection fraction, by estimation, is 15-20%. The left ventricle has severely decreased function. The left ventricle demonstrates regional wall motion abnormalities (see scoring diagram/findings for description). The left ventricular internal cavity size was severely dilated. Indeterminate diastolic filling due to E-A fusion.  2. Right ventricular systolic function is mildly reduced. The right ventricular size is normal. There is normal pulmonary artery  systolic pressure. The estimated right ventricular systolic pressure is 75.1 mmHg.  3. Left atrial size was mild to moderately dilated.  4. The mitral valve is grossly normal. Mild mitral valve regurgitation. No evidence of mitral stenosis.  5. The aortic valve is tricuspid. Aortic valve regurgitation is not visualized. No aortic stenosis is present.  6. The inferior vena cava is normal in size with greater than 50% respiratory variability, suggesting right atrial pressure of 3 mmHg. Comparison(s): Changes from prior study are noted. EF unchanged. WMA unchanged. Concerns for calcified mural thrombus as described above.  FINDINGS  Left Ventricle: There is calcification seen in the apex of the LV. This could represent a calcified mural thrombus. Best seen on apical 4C views. Difficult to distinguish from myocardial trabeculation. Would recommend limited contrast echocardiogram to exclude thrombus. EF severely reduced. Septum into apex akinetic with severe global hypokinesis. Left ventricular ejection fraction, by estimation, is 15-20%. The left ventricle has severely decreased function. The left ventricle demonstrates regional wall motion abnormalities. The left ventricular internal cavity size was severely dilated. There is no left ventricular hypertrophy. Indeterminate diastolic filling due to E-A fusion.  LV Wall Scoring: The entire septum and apex are akinetic. Right Ventricle: The right ventricular size is normal. No increase in right ventricular wall thickness. Right ventricular systolic function is mildly reduced. There is normal pulmonary artery systolic pressure. The tricuspid regurgitant velocity is 2.46 m/s, and with an assumed right atrial pressure of 3 mmHg, the estimated right ventricular systolic pressure is 93.7 mmHg. Left Atrium: Left atrial size was mild to moderately dilated. Right Atrium: Right atrial size was normal in size. Pericardium: Trivial pericardial effusion is present. Mitral Valve: The  mitral valve is grossly normal. Mild mitral valve regurgitation. No evidence of mitral valve stenosis. Tricuspid Valve: The tricuspid valve is grossly normal. Tricuspid valve regurgitation is trivial. No evidence of tricuspid stenosis. Aortic Valve: The aortic valve is tricuspid. Aortic valve regurgitation is not visualized. No aortic stenosis is present. Pulmonic Valve: The pulmonic valve was grossly normal. Pulmonic valve regurgitation is not visualized. No evidence of pulmonic stenosis. Aorta: The aortic root is normal in size and structure. Venous: The inferior vena cava is normal in size with greater than 50% respiratory variability, suggesting right atrial pressure of 3 mmHg. IAS/Shunts: The atrial septum is grossly normal.  LEFT VENTRICLE PLAX 2D LVIDd:         7.10 cm      Diastology LVIDs:         6.30 cm      LV e' lateral: 8.16 cm/s LV PW:         0.80 cm      LV e' medial:  4.90 cm/s LV IVS:        0.50 cm LVOT diam:     2.40 cm LV SV:         38 LV SV Index:   19 LVOT Area:     4.52 cm  LV Volumes (MOD) LV vol d, MOD A2C: 232.0 ml LV vol d, MOD A4C: 214.0 ml LV vol s, MOD A2C: 193.0 ml LV vol s, MOD A4C: 184.0 ml LV SV MOD A2C:     39.0 ml LV SV MOD A4C:     214.0 ml LV SV MOD BP:      37.7 ml RIGHT VENTRICLE RV S prime:     10.70 cm/s TAPSE (M-mode): 1.6 cm LEFT ATRIUM             Index       RIGHT ATRIUM           Index LA diam:        4.60 cm 2.27 cm/m  RA Area:     18.40 cm LA Vol (A2C):   60.1 ml 29.72 ml/m RA Volume:   46.10 ml  22.80 ml/m LA Vol (A4C):   83.8 ml 41.44 ml/m LA Biplane Vol: 75.4 ml 37.29 ml/m  AORTIC VALVE LVOT Vmax:   63.30 cm/s LVOT Vmean:  43.200 cm/s LVOT VTI:    0.085 m  AORTA Ao Root diam: 3.50 cm TRICUSPID VALVE TR Peak grad:   24.2 mmHg TR Vmax:        246.00 cm/s  SHUNTS Systemic VTI:  0.08 m Systemic Diam: 2.40 cm Eleonore Chiquito MD Electronically signed by Eleonore Chiquito MD Signature Date/Time: 11/26/2019/1:15:34 PM    Final     Assessment/Plan:  44yo M with hx of  CAD with ICM of EF <30% hx of mssa cardiac device infection in April 2020, now admitted for left ac abscess and serratia bacteremia -with concern for endocarditis  - continue with cefepime - recommend to get TEE  - follow up on cx from [facility] wound for soft tissue infection -- will start on vancomycin and pull off if it is MSSA isolated in wound cx. - recommend to get U/S of left arm to see that there are not any thrombus  Opiate dependence - defer to primary team, currently on regimen to help with withdrawal and suboxone.  Hep c ab positive - recommend to recheck HCV viral load

## 2019-11-27 NOTE — H&P (View-Only) (Signed)
[facility]  Total days of antibiotics 4         Reason for Consult: serratia endocarditis   Referring Physician: Maylene Roes  Principal Problem:   Bacteremia Active Problems:   Chronic combined systolic and diastolic CHF (congestive heart failure) ([place])   Abscess   Hypokalemia   IV drug user    HPI: John Davenport is a 44 y.o. 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 Admitted on 8/27 for CP, N/V x 6 days but also has recent heroin use 2 days PTA, and takes street procured suboxone. HAd been to the ED on 8/26 for  left AC abscess- blood cx drawn at that time showed serratia. cx from [facility] wound growing GPC and GNR. He has been on cefepime to treat bacteremia, remained afebrile. TTE showed calcified mural thrombus which is new from prior echo in April 2020. Repeat blood cx are pending.  Past Medical History:  Diagnosis Date  . Anxiety state, unspecified   . Back pain   . Cancer ([place])    Lung CA  . Chronic systolic heart failure ([place])    a. Ischemic CM (EF 30% by echo in 2011) b. echo 08/2015: EF 20% with severe HK and AK of the inferoseptal and apical myocardium.  . Coronary artery disease    a. 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  b. eventually required single vessel CABG (L-LAD) in 03/2009 due to restenosis. c. cath 2013 showed patent LIMA sequential to the diags with LAD filling retrograde, no new disease in RCA and Cx, low LVEDP, sx felt noncardiac. d. 08/2015: NSTEMI: CTO of prox LAD, 99% stenosis of small dCx (no stent), + for cocaine  . Esophageal reflux   . Heart attack ([place])   . History of hepatitis C virus infection 07/15/2018   07/20/18 Viral RNA negative suggest past, cleared infection  . Hypercholesteremia   . ICD (implantable cardioverter-defibrillator) infection ([place])    a. [place] Sci ICD 12/2008.    . Ischemic cardiomyopathy    echocardiogram 12/11: Mild LVH, EF 30-35%, anteroseptal and apical akinesis, grade 1 diastolic dysfunction  . LBP (low back pain)   . OSA (obstructive sleep apnea) 12/30/2010  . Paroxysmal ventricular tachycardia ([place])   . Stroke ([place])   . Tobacco abuse     Allergies: No Known Allergies   MEDICATIONS: . buprenorphine-naloxone  1 tablet Sublingual BID  . cloNIDine  0.1 mg Oral QID   Followed by  . [START ON 11/28/2019] cloNIDine  0.1 mg Oral BH-qamhs   Followed by  . [START ON 11/30/2019] cloNIDine  0.1 mg Oral QAC breakfast  . enoxaparin (LOVENOX) injection  40 mg Subcutaneous Daily  . pantoprazole  40 mg Oral BID AC    Social History   Tobacco Use  . Smoking status: Current Every Day Smoker    Packs/day: 0.50    Years: 20.00    Pack years: 10.00    Types: Cigarettes  . Smokeless tobacco: Former Systems developer    Types: Chew  . Tobacco comment: smoke 0.5 per week  Vaping Use  . Vaping Use: Never used  Substance Use Topics  . Alcohol use: No  . Drug use: Yes    Types: Cocaine    Family History  Problem Relation Age of Onset  . Heart attack  Father 86       died at 34 after multiple MI's  . COPD Maternal Grandmother     Review of Systems - Constitutional: positive for fever, chills, diaphoresis, activity change, appetite change, fatigue and unexpected weight change.  HENT: Negative for congestion, sore throat, rhinorrhea, sneezing, trouble swallowing and sinus pressure.  Eyes: Negative for photophobia and visual disturbance.  Respiratory: Negative for cough, chest tightness, shortness of breath, wheezing and stridor.  Cardiovascular: Negative for chest pain, palpitations and leg swelling.  Gastrointestinal: Negative for nausea, vomiting, abdominal pain, diarrhea, constipation, blood in stool, abdominal distention and anal bleeding.  Genitourinary: Negative for dysuria, hematuria, flank pain and difficulty urinating.  Musculoskeletal: Negative for  myalgias, back pain, joint swelling, arthralgias and gait problem.  Skin: positive wound. Negative for color change, pallor, rash   Neurological: Negative for dizziness, tremors, weakness and light-headedness.  Hematological: Negative for adenopathy. Does not bruise/bleed easily.  Psychiatric/Behavioral: Negative for behavioral problems, confusion, sleep disturbance, dysphoric mood, decreased concentration and agitation.    OBJECTIVE: Temp:  [97.7 F (36.5 C)-98.9 F (37.2 C)] 98.1 F (36.7 C) (08/29 1045) Pulse Rate:  [60-83] 69 (08/29 1045) Resp:  [16-23] 20 (08/29 1045) BP: (99-127)/(61-84) 99/61 (08/29 1045) SpO2:  [95 %-99 %] 99 % (08/29 1045) Weight:  [74.8 kg-75.7 kg] 75.7 kg (08/29 0433) Physical Exam  Constitutional: He is oriented to person, place, and time. He appears well-developed and well-nourished. No distress.  HENT:  Mouth/Throat: Oropharynx is clear and moist. No oropharyngeal exudate.  Cardiovascular: Normal rate, regular rhythm and normal heart sounds. Exam reveals no gallop and no friction rub.  No murmur heard.  Pulmonary/Chest: Effort normal and breath sounds normal. No respiratory distress. He has no wheezes.  Abdominal: Soft. Bowel sounds are normal. He exhibits no distension. There is no tenderness.  Ext: left ac wound and surrounding erythema Lymphadenopathy:  He has no cervical adenopathy.  Neurological: He is alert and oriented to person, place, and time.  Skin: Skin is warm and dry. No rash noted. No erythema.  Psychiatric: He has a normal mood and affect. His behavior is normal.     LABS: Results for orders placed or performed during the hospital encounter of 11/25/19 (from the past 48 hour(s))  Troponin I (High Sensitivity)     Status: None   Collection Time: 11/25/19  8:03 PM  Result Value Ref Range   Troponin I (High Sensitivity) 11 <18 ng/L    Comment: (NOTE) Elevated high sensitivity troponin I (hsTnI) values and significant  changes  across serial measurements may suggest ACS but many other  chronic and acute conditions are known to elevate hsTnI results.  Refer to the "Links" section for chest pain algorithms and additional  guidance. Performed at [facility], [place] [address] [place], [place] [address]   Comprehensive metabolic panel     Status: Abnormal   Collection Time: 11/25/19  8:03 PM  Result Value Ref Range   Sodium 135 135 - 145 mmol/L   Potassium 3.1 (L) 3.5 - 5.1 mmol/L   Chloride 99 98 - 111 mmol/L   CO2 20 (L) 22 - 32 mmol/L   Glucose, Bld 125 (H) 70 - 99 mg/dL    Comment: Glucose reference range applies only to samples taken after fasting for at least 8 hours.   BUN 16 6 - 20 mg/dL   Creatinine, Ser 0.88 0.61 - 1.24 mg/dL   Calcium 9.5 8.9 - 10.3 mg/dL   Total Protein 9.2 (  H) 6.5 - 8.1 g/dL   Albumin 2.9 (L) 3.5 - 5.0 g/dL   AST 20 15 - 41 U/L   ALT 15 0 - 44 U/L   Alkaline Phosphatase 257 (H) 38 - 126 U/L   Total Bilirubin 0.8 0.3 - 1.2 mg/dL   GFR calc non Af Amer >60 >60 mL/min   GFR calc Af Amer >60 >60 mL/min   Anion gap 16 (H) 5 - 15    Comment: Performed at [facility] [address], [place], [place] [address]  CBC with Differential     Status: Abnormal   Collection Time: 11/25/19  8:03 PM  Result Value Ref Range   WBC 17.5 (H) 4.0 - 10.5 K/uL   RBC 5.57 4.22 - 5.81 MIL/uL   Hemoglobin 14.8 13.0 - 17.0 g/dL   HCT 46.2 39 - 52 %   MCV 82.9 80.0 - 100.0 fL   MCH 26.6 26.0 - 34.0 pg   MCHC 32.0 30.0 - 36.0 g/dL   RDW 15.2 11.5 - 15.5 %   Platelets 354 150 - 400 K/uL   nRBC 0.0 0.0 - 0.2 %   Neutrophils Relative % 84 %   Neutro Abs 14.7 (H) 1.7 - 7.7 K/uL   Lymphocytes Relative 12 %   Lymphs Abs 2.1 0.7 - 4.0 K/uL   Monocytes Relative 3 %   Monocytes Absolute 0.6 0 - 1 K/uL   Eosinophils Relative 0 %   Eosinophils Absolute 0.0 0 - 0 K/uL   Basophils Relative 0 %   Basophils Absolute 0.0 0 - 0 K/uL   Immature Granulocytes 1 %   Abs Immature Granulocytes 0.09 (H)  0.00 - 0.07 K/uL    Comment: Performed at [facility] [address] [place], [place] [address]  Lactic acid, plasma     Status: None   Collection Time: 11/25/19  8:03 PM  Result Value Ref Range   Lactic Acid, Venous 1.8 0.5 - 1.9 mmol/L    Comment: Performed at [facility] [address] [place], [place] [address]  Blood culture (routine x 2)     Status: None (Preliminary result)   Collection Time: 11/25/19  9:23 PM   Specimen: BLOOD LEFT HAND  Result Value Ref Range   Specimen Description BLOOD LEFT HAND    Special Requests      BOTTLES DRAWN AEROBIC AND ANAEROBIC Blood Culture adequate volume   Culture      NO GROWTH < 12 HOURS Performed at [facility], [place] [address] [place], [place] [address]    Report Status PENDING   Lactic acid, plasma     Status: None   Collection Time: 11/25/19  9:24 PM  Result Value Ref Range   Lactic Acid, Venous 1.8 0.5 - 1.9 mmol/L    Comment: Performed at [facility], [place] [address] [place], [place] [address]  Troponin I (High Sensitivity)     Status: None   Collection Time: 11/25/19  9:24 PM  Result Value Ref Range   Troponin I (High Sensitivity) 9 <18 ng/L    Comment: (NOTE) Elevated high sensitivity troponin I (hsTnI) values and significant  changes across serial measurements may suggest ACS but many other  chronic and acute conditions are known to elevate hsTnI results.  Refer to the "Links" section for chest pain algorithms and additional  guidance. Performed at [facility], [place] [address] [place], [place] [address]  Culture, blood (single)     Status: None (Preliminary result)   Collection Time: 11/25/19  9:53 PM   Specimen: BLOOD  Result Value Ref Range   Specimen Description BLOOD SITE NOT SPECIFIED    Special Requests      BOTTLES DRAWN AEROBIC AND ANAEROBIC Blood Culture adequate volume   Culture  Setup Time      GRAM NEGATIVE RODS ANAEROBIC BOTTLE ONLY CRITICAL VALUE NOTED.  VALUE IS  CONSISTENT WITH PREVIOUSLY REPORTED AND CALLED VALUE. Performed at [facility], [place] [address], [place], [place] [address]    Culture GRAM NEGATIVE RODS    Report Status PENDING   SARS Coronavirus 2 by RT PCR (hospital order, performed in [facility] hospital lab) Nasopharyngeal Nasopharyngeal Swab     Status: None   Collection Time: 11/26/19 12:06 AM   Specimen: Nasopharyngeal Swab  Result Value Ref Range   SARS Coronavirus 2 NEGATIVE NEGATIVE    Comment: (NOTE) SARS-CoV-2 target nucleic acids are NOT DETECTED.  The SARS-CoV-2 RNA is generally detectable in upper and lower respiratory specimens during the acute phase of infection. The lowest concentration of SARS-CoV-2 viral copies this assay can detect is 250 copies / mL. A negative result does not preclude SARS-CoV-2 infection and should not be used as the sole basis for treatment or other patient management decisions.  A negative result may occur with improper specimen collection / handling, submission of specimen other than nasopharyngeal swab, presence of viral mutation(s) within the areas targeted by this assay, and inadequate number of viral copies (<250 copies / mL). A negative result must be combined with clinical observations, patient history, and epidemiological information.  Fact Sheet for Patients:   StrictlyIdeas.no  Fact Sheet for Healthcare Providers: BankingDealers.co.za  This test is not yet approved or  cleared by the [place] FDA and has been authorized for detection and/or diagnosis of SARS-CoV-2 by FDA under an Emergency Use Authorization (EUA).  This EUA will remain in effect (meaning this test can be used) for the duration of the COVID-19 declaration under Section 564(b)(1) of the Act, 21 U.S.C. section 360bbb-3(b)(1), unless the authorization is terminated or revoked sooner.  Performed at [facility], [place] [address] [place],  [place] [address]   HIV Antibody (routine testing w rflx)     Status: None   Collection Time: 11/26/19  5:39 AM  Result Value Ref Range   HIV Screen 4th Generation wRfx Non Reactive Non Reactive    Comment: Performed at [facility], [place] [address], [place], [place] [address]  Basic metabolic panel     Status: Abnormal   Collection Time: 11/26/19  5:39 AM  Result Value Ref Range   Sodium 136 135 - 145 mmol/L   Potassium 3.4 (L) 3.5 - 5.1 mmol/L   Chloride 102 98 - 111 mmol/L   CO2 20 (L) 22 - 32 mmol/L   Glucose, Bld 104 (H) 70 - 99 mg/dL    Comment: Glucose reference range applies only to samples taken after fasting for at least 8 hours.   BUN 15 6 - 20 mg/dL   Creatinine, Ser 0.84 0.61 - 1.24 mg/dL   Calcium 9.1 8.9 - 10.3 mg/dL   GFR calc non Af Amer >60 >60 mL/min   GFR calc Af Amer >60 >60 mL/min   Anion gap 14 5 - 15    Comment: Performed at [facility] [address], [place], [place] [address]  CBC  Status: Abnormal   Collection Time: 11/26/19  5:39 AM  Result Value Ref Range   WBC 17.0 (H) 4.0 - 10.5 K/uL   RBC 5.13 4.22 - 5.81 MIL/uL   Hemoglobin 13.9 13.0 - 17.0 g/dL   HCT 42.5 39 - 52 %   MCV 82.8 80.0 - 100.0 fL   MCH 27.1 26.0 - 34.0 pg   MCHC 32.7 30.0 - 36.0 g/dL   RDW 15.2 11.5 - 15.5 %   Platelets 421 (H) 150 - 400 K/uL   nRBC 0.0 0.0 - 0.2 %    Comment: Performed at [facility], [place] [address], [place], [place] [address]  Comprehensive metabolic panel     Status: Abnormal   Collection Time: 11/27/19  3:27 AM  Result Value Ref Range   Sodium 135 135 - 145 mmol/L   Potassium 4.2 3.5 - 5.1 mmol/L   Chloride 104 98 - 111 mmol/L   CO2 23 22 - 32 mmol/L   Glucose, Bld 93 70 - 99 mg/dL    Comment: Glucose reference range applies only to samples taken after fasting for at least 8 hours.   BUN 14 6 - 20 mg/dL   Creatinine, Ser 0.98 0.61 - 1.24 mg/dL   Calcium 8.8 (L) 8.9 - 10.3 mg/dL   Total Protein 6.8 6.5 - 8.1 g/dL   Albumin 2.2 (L) 3.5 -  5.0 g/dL   AST 18 15 - 41 U/L   ALT 12 0 - 44 U/L   Alkaline Phosphatase 152 (H) 38 - 126 U/L   Total Bilirubin 0.6 0.3 - 1.2 mg/dL   GFR calc non Af Amer >60 >60 mL/min   GFR calc Af Amer >60 >60 mL/min   Anion gap 8 5 - 15    Comment: Performed at [facility] [address], [place], [place] [address]  Magnesium     Status: Abnormal   Collection Time: 11/27/19  3:27 AM  Result Value Ref Range   Magnesium 1.6 (L) 1.7 - 2.4 mg/dL    Comment: Performed at [facility] [address], [place], [place] [address]  Phosphorus     Status: None   Collection Time: 11/27/19  3:27 AM  Result Value Ref Range   Phosphorus 2.5 2.5 - 4.6 mg/dL    Comment: Performed at [facility], [place] [address], [place], [place] [address]  CBC with Differential/Platelet     Status: Abnormal   Collection Time: 11/27/19  3:27 AM  Result Value Ref Range   WBC 13.6 (H) 4.0 - 10.5 K/uL   RBC 4.59 4.22 - 5.81 MIL/uL   Hemoglobin 12.1 (L) 13.0 - 17.0 g/dL   HCT 38.1 (L) 39 - 52 %   MCV 83.0 80.0 - 100.0 fL   MCH 26.4 26.0 - 34.0 pg   MCHC 31.8 30.0 - 36.0 g/dL   RDW 15.6 (H) 11.5 - 15.5 %   Platelets 377 150 - 400 K/uL   nRBC 0.0 0.0 - 0.2 %   Neutrophils Relative % 60 %   Neutro Abs 8.1 (H) 1.7 - 7.7 K/uL   Lymphocytes Relative 32 %   Lymphs Abs 4.4 (H) 0.7 - 4.0 K/uL   Monocytes Relative 7 %   Monocytes Absolute 1.0 0 - 1 K/uL   Eosinophils Relative 0 %   Eosinophils Absolute 0.0 0 - 0 K/uL   Basophils Relative 1 %   Basophils Absolute 0.1 0 - 0 K/uL  Immature Granulocytes 0 %   Abs Immature Granulocytes 0.06 0.00 - 0.07 K/uL    Comment: Performed at [facility], [place] [address] [place], [place] [address]    MICRO: 8/26 blood cx serratia IMAGING: DG Chest 2 View  Result Date: 11/25/2019 CLINICAL DATA:  44 year old male with chest pain. EXAM: CHEST - 2 VIEW COMPARISON:  Chest radiograph dated 11/24/2019. FINDINGS: There is no focal consolidation, pleural effusion or  pneumothorax. The cardiac silhouette is within limits. Median sternotomy wires and CABG vascular clips. No acute osseous pathology. IMPRESSION: No active cardiopulmonary disease. Electronically Signed   By: Anner Crete M.D.   On: 11/25/2019 19:23   ECHOCARDIOGRAM COMPLETE  Result Date: 11/26/2019    ECHOCARDIOGRAM REPORT   Patient Name:   SHIA EBER Date of Exam: 11/26/2019 Medical Rec #:  [id number]            Height:       75.0 in Accession #:    [id number]           Weight:       165.0 lb Date of Birth:  [birth year]             BSA:          2.022 m Patient Age:    89 years             BP:           135/90 mmHg Patient Gender: M                    HR:           93 bpm. Exam Location:  Inpatient Procedure: 2D Echo, Color Doppler and Cardiac Doppler Indications:    R07.9* Chest pain, unspecified  History:        Patient has prior history of Echocardiogram examinations, most                 recent 07/16/2018. CHF, CAD; Risk Factors:IVDU, Sleep Apnea and                 Dyslipidemia. ICD Wire removed 07/16/18 due to infection.  Sonographer:    Raquel Sarna Senior RDCS Referring Phys: [id number] [place]  1. There is calcification seen in the apex of the LV. This could represent a calcified mural thrombus. Best seen on apical 4C views. Difficult to distinguish from myocardial trabeculation. Would recommend limited contrast echocardiogram to exclude thrombus. EF severely reduced. Septum into apex akinetic with severe global hypokinesis. Left ventricular ejection fraction, by estimation, is 15-20%. The left ventricle has severely decreased function. The left ventricle demonstrates regional wall motion abnormalities (see scoring diagram/findings for description). The left ventricular internal cavity size was severely dilated. Indeterminate diastolic filling due to E-A fusion.  2. Right ventricular systolic function is mildly reduced. The right ventricular size is normal. There is normal pulmonary artery  systolic pressure. The estimated right ventricular systolic pressure is 83.6 mmHg.  3. Left atrial size was mild to moderately dilated.  4. The mitral valve is grossly normal. Mild mitral valve regurgitation. No evidence of mitral stenosis.  5. The aortic valve is tricuspid. Aortic valve regurgitation is not visualized. No aortic stenosis is present.  6. The inferior vena cava is normal in size with greater than 50% respiratory variability, suggesting right atrial pressure of 3 mmHg. Comparison(s): Changes from prior study are noted. EF unchanged. WMA unchanged. Concerns for calcified mural thrombus as described above.  FINDINGS  Left Ventricle: There is calcification seen in the apex of the LV. This could represent a calcified mural thrombus. Best seen on apical 4C views. Difficult to distinguish from myocardial trabeculation. Would recommend limited contrast echocardiogram to exclude thrombus. EF severely reduced. Septum into apex akinetic with severe global hypokinesis. Left ventricular ejection fraction, by estimation, is 15-20%. The left ventricle has severely decreased function. The left ventricle demonstrates regional wall motion abnormalities. The left ventricular internal cavity size was severely dilated. There is no left ventricular hypertrophy. Indeterminate diastolic filling due to E-A fusion.  LV Wall Scoring: The entire septum and apex are akinetic. Right Ventricle: The right ventricular size is normal. No increase in right ventricular wall thickness. Right ventricular systolic function is mildly reduced. There is normal pulmonary artery systolic pressure. The tricuspid regurgitant velocity is 2.46 m/s, and with an assumed right atrial pressure of 3 mmHg, the estimated right ventricular systolic pressure is 46.9 mmHg. Left Atrium: Left atrial size was mild to moderately dilated. Right Atrium: Right atrial size was normal in size. Pericardium: Trivial pericardial effusion is present. Mitral Valve: The  mitral valve is grossly normal. Mild mitral valve regurgitation. No evidence of mitral valve stenosis. Tricuspid Valve: The tricuspid valve is grossly normal. Tricuspid valve regurgitation is trivial. No evidence of tricuspid stenosis. Aortic Valve: The aortic valve is tricuspid. Aortic valve regurgitation is not visualized. No aortic stenosis is present. Pulmonic Valve: The pulmonic valve was grossly normal. Pulmonic valve regurgitation is not visualized. No evidence of pulmonic stenosis. Aorta: The aortic root is normal in size and structure. Venous: The inferior vena cava is normal in size with greater than 50% respiratory variability, suggesting right atrial pressure of 3 mmHg. IAS/Shunts: The atrial septum is grossly normal.  LEFT VENTRICLE PLAX 2D LVIDd:         7.10 cm      Diastology LVIDs:         6.30 cm      LV e' lateral: 8.16 cm/s LV PW:         0.80 cm      LV e' medial:  4.90 cm/s LV IVS:        0.50 cm LVOT diam:     2.40 cm LV SV:         38 LV SV Index:   19 LVOT Area:     4.52 cm  LV Volumes (MOD) LV vol d, MOD A2C: 232.0 ml LV vol d, MOD A4C: 214.0 ml LV vol s, MOD A2C: 193.0 ml LV vol s, MOD A4C: 184.0 ml LV SV MOD A2C:     39.0 ml LV SV MOD A4C:     214.0 ml LV SV MOD BP:      37.7 ml RIGHT VENTRICLE RV S prime:     10.70 cm/s TAPSE (M-mode): 1.6 cm LEFT ATRIUM             Index       RIGHT ATRIUM           Index LA diam:        4.60 cm 2.27 cm/m  RA Area:     18.40 cm LA Vol (A2C):   60.1 ml 29.72 ml/m RA Volume:   46.10 ml  22.80 ml/m LA Vol (A4C):   83.8 ml 41.44 ml/m LA Biplane Vol: 75.4 ml 37.29 ml/m  AORTIC VALVE LVOT Vmax:   63.30 cm/s LVOT Vmean:  43.200 cm/s LVOT VTI:    0.085 m  AORTA Ao Root diam: 3.50 cm TRICUSPID VALVE TR Peak grad:   24.2 mmHg TR Vmax:        246.00 cm/s  SHUNTS Systemic VTI:  0.08 m Systemic Diam: 2.40 cm Eleonore Chiquito MD Electronically signed by Eleonore Chiquito MD Signature Date/Time: 11/26/2019/1:15:34 PM    Final     Assessment/Plan:  44yo M with hx of  CAD with ICM of EF <30% hx of mssa cardiac device infection in April 2020, now admitted for left ac abscess and serratia bacteremia -with concern for endocarditis  - continue with cefepime - recommend to get TEE  - follow up on cx from [facility] wound for soft tissue infection -- will start on vancomycin and pull off if it is MSSA isolated in wound cx. - recommend to get U/S of left arm to see that there are not any thrombus  Opiate dependence - defer to primary team, currently on regimen to help with withdrawal and suboxone.  Hep c ab positive - recommend to recheck HCV viral load

## 2019-11-27 NOTE — Progress Notes (Signed)
PROGRESS NOTE    GAGANDEEP PETTET  VOZ:[id number] DOB: [birth year] DOA: 11/25/2019 PCP: Patient, No Pcp Per     Brief Narrative:  John Davenport is a 44 y.o. male with medical history significant for IV drug use, paroxysmal ventricular tachycardia with history of ICD placement and removal in 06/2018 due to MSSA bacteremia with vegetation on device, CAD s/p P CABG x5, OSA not on CPAP, chronic combined systolic and diastolic heart failure, CVA, CKD stage III, hepatitis C who presents with concerns of chest pain. Patient presented with similar symptoms of chest pain on 8/26 to the ED.  He was noted to have a troponin of 11 with no ischemic changes seen on EKG.  Had mild leukocytosis of 16.5 but was afebrile without tachycardia.  He was also noted to have left upper extremity abscess that was drained in the ED.  There was plans for admission for IV antibiotics but patient left AMA. Blood culture obtained at that time later now positive for Serratia. Patient now admitted for bacteremia.   New events last 24 hours / Subjective: Complains of burning chest pain with oral intake. Also asking for his Suboxone dose to be increased and his pain medications to be increased.  Assessment & Plan:   Principal Problem:   Bacteremia Active Problems:   Chronic combined systolic and diastolic CHF (congestive heart failure) (HCC)   Abscess   Hypokalemia   IV drug user   Serratia bacteremia in setting of IV drug abuse -Echocardiogram revealed possible mural thrombus -TEE requested -Continue IV cefepime -Infectious disease consulted  Left upper extremity abscess -Status post I&D in the emergency department 8/26 with packing in place  Chronic combined systolic and diastolic heart failure -EF of 15-20%  -ICD removed in April 2020 due to MSSA bacteremia. Deemed not to be a candidate for replacement due to continued IV drug use  IV drug abuse -Endorsed heroin use 2 days prior to admission -Continue  clonidine withdrawal protocol. Continue Suboxone   Hypomagnesemia -Replace, trend  GERD -Protonix   DVT prophylaxis:  enoxaparin (LOVENOX) injection 40 mg Start: 11/26/19 1000  Code Status: Full code Family Communication: No family at bedside Disposition Plan:   Status is: Inpatient  Remains inpatient appropriate because:Ongoing diagnostic testing needed not appropriate for outpatient work up, IV treatments appropriate due to intensity of illness or inability to take PO and Inpatient level of care appropriate due to severity of illness   Dispo: The patient is from: Home              Anticipated d/c is to: Home              Anticipated d/c date is: > 3 days              Patient currently is not medically stable to d/c. Remains on IV cefepime, TEE planned for 8/30   Consultants:   ID  Procedures:   TEE 8/30  Antimicrobials:  Anti-infectives (From admission, onward)   Start     Dose/Rate Route Frequency Ordered Stop   11/25/19 2200  ceFEPIme (MAXIPIME) 2 g in sodium chloride 0.9 % 100 mL IVPB        2 g 200 mL/hr over 30 Minutes Intravenous Every 8 hours 11/25/19 2051     11/25/19 2100  cefTRIAXone (ROCEPHIN) 2 g in sodium chloride 0.9 % 100 mL IVPB  Status:  Discontinued        2 g 200 mL/hr over 30 Minutes Intravenous  Once 11/25/19 2046 11/25/19 2051   11/25/19 2045  vancomycin (VANCOREADY) IVPB 1500 mg/300 mL  Status:  Discontinued        1,500 mg 150 mL/hr over 120 Minutes Intravenous  Once 11/25/19 2046 11/25/19 2051        Objective: Vitals:   11/26/19 2203 11/26/19 2356 11/27/19 0433 11/27/19 0720  BP: 124/82 111/71 118/84 127/83  Pulse:  60 65 76  Resp:  18 18 16   Temp:  98.6 F (37 C) 97.9 F (36.6 C) 97.7 F (36.5 C)  TempSrc:  Oral Oral Oral  SpO2:  98% 99% 95%  Weight:   75.7 kg   Height:        Intake/Output Summary (Last 24 hours) at 11/27/2019 1135 Last data filed at 11/27/2019 0300 Gross per 24 hour  Intake 100 ml  Output --  Net  100 ml   Filed Weights   11/26/19 1637 11/27/19 0433  Weight: 74.8 kg 75.7 kg    Examination:  General exam: Appears calm and comfortable  Respiratory system: Clear to auscultation. Respiratory effort normal. No respiratory distress. No conversational dyspnea.  Cardiovascular system: S1 & S2 heard, RRR. No pedal edema. Gastrointestinal system: Abdomen is nondistended, soft and nontender. Normal bowel sounds heard. Central nervous system: Alert and oriented. No focal neurological deficits. Speech clear.  Extremities: Symmetric in appearance  Skin: Left UE abscess s/p drainage with packing in place, without erythema  Psychiatry: Judgement and insight appear normal. Mood & affect appropriate.   Data Reviewed: I have personally reviewed following labs and imaging studies  CBC: Recent Labs  Lab 11/24/19 1113 11/25/19 2003 11/26/19 0539 11/27/19 0327  WBC 16.5* 17.5* 17.0* 13.6*  NEUTROABS  --  14.7*  --  8.1*  HGB 13.8 14.8 13.9 12.1*  HCT 43.9 46.2 42.5 38.1*  MCV 85.9 82.9 82.8 83.0  PLT 331 354 421* 366   Basic Metabolic Panel: Recent Labs  Lab 11/24/19 1113 11/25/19 2003 11/26/19 0539 11/27/19 0327  NA 135 135 136 135  K 3.4* 3.1* 3.4* 4.2  CL 100 99 102 104  CO2 23 20* 20* 23  GLUCOSE 105* 125* 104* 93  BUN 15 16 15 14   CREATININE 0.86 0.88 0.84 0.98  CALCIUM 9.0 9.5 9.1 8.8*  MG  --   --   --  1.6*  PHOS  --   --   --  2.5   GFR: Estimated Creatinine Clearance: 100.8 mL/min (by C-G formula based on SCr of 0.98 mg/dL). Liver Function Tests: Recent Labs  Lab 11/25/19 2003 11/27/19 0327  AST 20 18  ALT 15 12  ALKPHOS 257* 152*  BILITOT 0.8 0.6  PROT 9.2* 6.8  ALBUMIN 2.9* 2.2*   No results for input(s): LIPASE, AMYLASE in the last 168 hours. No results for input(s): AMMONIA in the last 168 hours. Coagulation Profile: No results for input(s): INR, PROTIME in the last 168 hours. Cardiac Enzymes: No results for input(s): CKTOTAL, CKMB, CKMBINDEX,  TROPONINI in the last 168 hours. BNP (last 3 results) No results for input(s): PROBNP in the last 8760 hours. HbA1C: No results for input(s): HGBA1C in the last 72 hours. CBG: No results for input(s): GLUCAP in the last 168 hours. Lipid Profile: No results for input(s): CHOL, HDL, LDLCALC, TRIG, CHOLHDL, LDLDIRECT in the last 72 hours. Thyroid Function Tests: No results for input(s): TSH, T4TOTAL, FREET4, T3FREE, THYROIDAB in the last 72 hours. Anemia Panel: No results for input(s): VITAMINB12, FOLATE, FERRITIN, TIBC, IRON, RETICCTPCT in  the last 72 hours. Sepsis Labs: Recent Labs  Lab 11/24/19 1113 11/25/19 2003 11/25/19 2124  LATICACIDVEN 1.5 1.8 1.8    Recent Results (from the past 240 hour(s))  Blood culture (routine x 2)     Status: Abnormal (Preliminary result)   Collection Time: 11/24/19 11:05 AM   Specimen: BLOOD  Result Value Ref Range Status   Specimen Description   Final    BLOOD RIGHT ANTECUBITAL Performed at [facility], [address] [place], [place] [address]    Special Requests   Final    BOTTLES DRAWN AEROBIC AND ANAEROBIC Blood Culture adequate volume Performed at [facility], [address] [place], [place] [address]    Culture  Setup Time   Final    GRAM NEGATIVE RODS IN BOTH AEROBIC AND ANAEROBIC BOTTLES Organism ID to follow CRITICAL RESULT CALLED TO, READ BACK BY AND VERIFIED WITH: Rosemarie Ax RN 10:25 11/25/19 ([email or web address])    Culture (A)  Final    SERRATIA MARCESCENS SUSCEPTIBILITIES TO FOLLOW Performed at [facility], [place] [address] [place], [place] [address]    Report Status PENDING  Incomplete  Blood Culture ID Panel (Reflexed)     Status: Abnormal   Collection Time: 11/24/19 11:05 AM  Result Value Ref Range Status   Enterococcus faecalis NOT DETECTED NOT DETECTED Final   Enterococcus Faecium NOT DETECTED NOT DETECTED Final   Listeria monocytogenes NOT DETECTED NOT DETECTED Final   Staphylococcus species NOT  DETECTED NOT DETECTED Final   Staphylococcus aureus (BCID) NOT DETECTED NOT DETECTED Final   Staphylococcus epidermidis NOT DETECTED NOT DETECTED Final   Staphylococcus lugdunensis NOT DETECTED NOT DETECTED Final   Streptococcus species NOT DETECTED NOT DETECTED Final   Streptococcus agalactiae NOT DETECTED NOT DETECTED Final   Streptococcus pneumoniae NOT DETECTED NOT DETECTED Final   Streptococcus pyogenes NOT DETECTED NOT DETECTED Final   A.calcoaceticus-baumannii NOT DETECTED NOT DETECTED Final   Bacteroides fragilis NOT DETECTED NOT DETECTED Final   Enterobacterales DETECTED (A) NOT DETECTED Final    Comment: Enterobacterales represent a large order of gram negative bacteria, not a single organism. CRITICAL RESULT CALLED TO, READ BACK BY AND VERIFIED WITH: Rosemarie Ax RN 10:25 11/25/19 ([email or web address])    Enterobacter cloacae complex NOT DETECTED NOT DETECTED Final   Escherichia coli NOT DETECTED NOT DETECTED Final   Klebsiella aerogenes NOT DETECTED NOT DETECTED Final   Klebsiella oxytoca NOT DETECTED NOT DETECTED Final   Klebsiella pneumoniae NOT DETECTED NOT DETECTED Final   Proteus species NOT DETECTED NOT DETECTED Final   Salmonella species NOT DETECTED NOT DETECTED Final   Serratia marcescens DETECTED (A) NOT DETECTED Final    Comment: CRITICAL RESULT CALLED TO, READ BACK BY AND VERIFIED WITH: Rosemarie Ax RN 10:25 11/25/19 ([email or web address])    Haemophilus influenzae NOT DETECTED NOT DETECTED Final   Neisseria meningitidis NOT DETECTED NOT DETECTED Final   Pseudomonas aeruginosa NOT DETECTED NOT DETECTED Final   Stenotrophomonas maltophilia NOT DETECTED NOT DETECTED Final   Candida albicans NOT DETECTED NOT DETECTED Final   Candida auris NOT DETECTED NOT DETECTED Final   Candida glabrata NOT DETECTED NOT DETECTED Final   Candida krusei NOT DETECTED NOT DETECTED Final   Candida parapsilosis NOT DETECTED NOT DETECTED Final   Candida tropicalis NOT DETECTED NOT DETECTED Final   Cryptococcus  neoformans/gattii NOT DETECTED NOT DETECTED Final   CTX-M ESBL NOT DETECTED NOT DETECTED Final   Carbapenem resistance IMP NOT DETECTED NOT DETECTED Final  Carbapenem resistance KPC NOT DETECTED NOT DETECTED Final   Carbapenem resistance NDM NOT DETECTED NOT DETECTED Final   Carbapenem resist OXA 48 LIKE NOT DETECTED NOT DETECTED Final   Carbapenem resistance VIM NOT DETECTED NOT DETECTED Final    Comment: Performed at [facility], [place] [address], [place], [place] [address]  Wound or Superficial Culture     Status: None (Preliminary result)   Collection Time: 11/24/19 11:57 AM   Specimen: Abscess; Wound  Result Value Ref Range Status   Specimen Description   Final    ABSCESS Performed at [facility], [address] [place], [place] [address]    Special Requests   Final    NONE Performed at [facility], [address] [place], [place] [address]    Gram Stain   Final    ABUNDANT WBC PRESENT,BOTH PMN AND MONONUCLEAR MODERATE GRAM POSITIVE COCCI FEW GRAM NEGATIVE RODS    Culture   Final    FEW GRAM NEGATIVE RODS IDENTIFICATION TO FOLLOW Performed at [facility], [place] [address], [place], [place] [address]    Report Status PENDING  Incomplete  Blood culture (routine x 2)     Status: None (Preliminary result)   Collection Time: 11/25/19  9:23 PM   Specimen: BLOOD LEFT HAND  Result Value Ref Range Status   Specimen Description BLOOD LEFT HAND  Final   Special Requests   Final    BOTTLES DRAWN AEROBIC AND ANAEROBIC Blood Culture adequate volume   Culture   Final    NO GROWTH < 12 HOURS Performed at [facility], [place] [address], [place], [place] [address]    Report Status PENDING  Incomplete  Culture, blood (single)     Status: None (Preliminary result)   Collection Time: 11/25/19  9:53 PM   Specimen: BLOOD  Result Value Ref Range Status   Specimen Description BLOOD SITE NOT SPECIFIED  Final   Special Requests   Final    BOTTLES  DRAWN AEROBIC AND ANAEROBIC Blood Culture adequate volume   Culture  Setup Time   Final    GRAM NEGATIVE RODS ANAEROBIC BOTTLE ONLY CRITICAL VALUE NOTED.  VALUE IS CONSISTENT WITH PREVIOUSLY REPORTED AND CALLED VALUE. Performed at [facility], [place] [address], [place], [place] [address]    Culture GRAM NEGATIVE RODS  Final   Report Status PENDING  Incomplete  SARS Coronavirus 2 by RT PCR (hospital order, performed in [facility] hospital lab) Nasopharyngeal Nasopharyngeal Swab     Status: None   Collection Time: 11/26/19 12:06 AM   Specimen: Nasopharyngeal Swab  Result Value Ref Range Status   SARS Coronavirus 2 NEGATIVE NEGATIVE Final    Comment: (NOTE) SARS-CoV-2 target nucleic acids are NOT DETECTED.  The SARS-CoV-2 RNA is generally detectable in upper and lower respiratory specimens during the acute phase of infection. The lowest concentration of SARS-CoV-2 viral copies this assay can detect is 250 copies / mL. A negative result does not preclude SARS-CoV-2 infection and should not be used as the sole basis for treatment or other patient management decisions.  A negative result may occur with improper specimen collection / handling, submission of specimen other than nasopharyngeal swab, presence of viral mutation(s) within the areas targeted by this assay, and inadequate number of viral copies (<250 copies / mL). A negative result must be combined with clinical observations, patient history, and epidemiological information.  Fact Sheet for Patients:  StrictlyIdeas.no  Fact Sheet for Healthcare Providers: BankingDealers.co.za  This test is not yet approved or  cleared by the [place] FDA and has been authorized for detection and/or diagnosis of SARS-CoV-2 by FDA under an Emergency Use Authorization (EUA).  This EUA will remain in effect (meaning this test can be used) for the duration of the COVID-19 declaration under  Section 564(b)(1) of the Act, 21 U.S.C. section 360bbb-3(b)(1), unless the authorization is terminated or revoked sooner.  Performed at [facility], [place] [address] [place], [place] [address]       Radiology Studies: DG Chest 2 View  Result Date: 11/25/2019 CLINICAL DATA:  44 year old male with chest pain. EXAM: CHEST - 2 VIEW COMPARISON:  Chest radiograph dated 11/24/2019. FINDINGS: There is no focal consolidation, pleural effusion or pneumothorax. The cardiac silhouette is within limits. Median sternotomy wires and CABG vascular clips. No acute osseous pathology. IMPRESSION: No active cardiopulmonary disease. Electronically Signed   By: Anner Crete M.D.   On: 11/25/2019 19:23   ECHOCARDIOGRAM COMPLETE  Result Date: 11/26/2019    ECHOCARDIOGRAM REPORT   Patient Name:   John Davenport Date of Exam: 11/26/2019 Medical Rec #:  [id number]            Height:       75.0 in Accession #:    [id number]           Weight:       165.0 lb Date of Birth:  1975-08-06             BSA:          2.022 m Patient Age:    89 years             BP:           135/90 mmHg Patient Gender: M                    HR:           93 bpm. Exam Location:  Inpatient Procedure: 2D Echo, Color Doppler and Cardiac Doppler Indications:    R07.9* Chest pain, unspecified  History:        Patient has prior history of Echocardiogram examinations, most                 recent 07/16/2018. CHF, CAD; Risk Factors:IVDU, Sleep Apnea and                 Dyslipidemia. ICD Wire removed 07/16/18 due to infection.  Sonographer:    Raquel Sarna Senior RDCS Referring Phys: [id number] [place]  1. There is calcification seen in the apex of the LV. This could represent a calcified mural thrombus. Best seen on apical 4C views. Difficult to distinguish from myocardial trabeculation. Would recommend limited contrast echocardiogram to exclude thrombus. EF severely reduced. Septum into apex akinetic with severe global hypokinesis. Left  ventricular ejection fraction, by estimation, is 15-20%. The left ventricle has severely decreased function. The left ventricle demonstrates regional wall motion abnormalities (see scoring diagram/findings for description). The left ventricular internal cavity size was severely dilated. Indeterminate diastolic filling due to E-A fusion.  2. Right ventricular systolic function is mildly reduced. The right ventricular size is normal. There is normal pulmonary artery systolic pressure. The estimated right ventricular systolic pressure is 64.6 mmHg.  3. Left atrial size was mild to moderately dilated.  4. The mitral valve is grossly normal. Mild mitral valve regurgitation. No evidence of mitral  stenosis.  5. The aortic valve is tricuspid. Aortic valve regurgitation is not visualized. No aortic stenosis is present.  6. The inferior vena cava is normal in size with greater than 50% respiratory variability, suggesting right atrial pressure of 3 mmHg. Comparison(s): Changes from prior study are noted. EF unchanged. WMA unchanged. Concerns for calcified mural thrombus as described above. FINDINGS  Left Ventricle: There is calcification seen in the apex of the LV. This could represent a calcified mural thrombus. Best seen on apical 4C views. Difficult to distinguish from myocardial trabeculation. Would recommend limited contrast echocardiogram to exclude thrombus. EF severely reduced. Septum into apex akinetic with severe global hypokinesis. Left ventricular ejection fraction, by estimation, is 15-20%. The left ventricle has severely decreased function. The left ventricle demonstrates regional wall motion abnormalities. The left ventricular internal cavity size was severely dilated. There is no left ventricular hypertrophy. Indeterminate diastolic filling due to E-A fusion.  LV Wall Scoring: The entire septum and apex are akinetic. Right Ventricle: The right ventricular size is normal. No increase in right ventricular wall  thickness. Right ventricular systolic function is mildly reduced. There is normal pulmonary artery systolic pressure. The tricuspid regurgitant velocity is 2.46 m/s, and with an assumed right atrial pressure of 3 mmHg, the estimated right ventricular systolic pressure is 91.4 mmHg. Left Atrium: Left atrial size was mild to moderately dilated. Right Atrium: Right atrial size was normal in size. Pericardium: Trivial pericardial effusion is present. Mitral Valve: The mitral valve is grossly normal. Mild mitral valve regurgitation. No evidence of mitral valve stenosis. Tricuspid Valve: The tricuspid valve is grossly normal. Tricuspid valve regurgitation is trivial. No evidence of tricuspid stenosis. Aortic Valve: The aortic valve is tricuspid. Aortic valve regurgitation is not visualized. No aortic stenosis is present. Pulmonic Valve: The pulmonic valve was grossly normal. Pulmonic valve regurgitation is not visualized. No evidence of pulmonic stenosis. Aorta: The aortic root is normal in size and structure. Venous: The inferior vena cava is normal in size with greater than 50% respiratory variability, suggesting right atrial pressure of 3 mmHg. IAS/Shunts: The atrial septum is grossly normal.  LEFT VENTRICLE PLAX 2D LVIDd:         7.10 cm      Diastology LVIDs:         6.30 cm      LV e' lateral: 8.16 cm/s LV PW:         0.80 cm      LV e' medial:  4.90 cm/s LV IVS:        0.50 cm LVOT diam:     2.40 cm LV SV:         38 LV SV Index:   19 LVOT Area:     4.52 cm  LV Volumes (MOD) LV vol d, MOD A2C: 232.0 ml LV vol d, MOD A4C: 214.0 ml LV vol s, MOD A2C: 193.0 ml LV vol s, MOD A4C: 184.0 ml LV SV MOD A2C:     39.0 ml LV SV MOD A4C:     214.0 ml LV SV MOD BP:      37.7 ml RIGHT VENTRICLE RV S prime:     10.70 cm/s TAPSE (M-mode): 1.6 cm LEFT ATRIUM             Index       RIGHT ATRIUM           Index LA diam:        4.60 cm 2.27 cm/m  RA Area:  18.40 cm LA Vol (A2C):   60.1 ml 29.72 ml/m RA Volume:   46.10 ml   22.80 ml/m LA Vol (A4C):   83.8 ml 41.44 ml/m LA Biplane Vol: 75.4 ml 37.29 ml/m  AORTIC VALVE LVOT Vmax:   63.30 cm/s LVOT Vmean:  43.200 cm/s LVOT VTI:    0.085 m  AORTA Ao Root diam: 3.50 cm TRICUSPID VALVE TR Peak grad:   24.2 mmHg TR Vmax:        246.00 cm/s  SHUNTS Systemic VTI:  0.08 m Systemic Diam: 2.40 cm Eleonore Chiquito MD Electronically signed by Eleonore Chiquito MD Signature Date/Time: 11/26/2019/1:15:34 PM    Final       Scheduled Meds: . buprenorphine-naloxone  1 tablet Sublingual BID  . cloNIDine  0.1 mg Oral QID   Followed by  . [START ON 11/28/2019] cloNIDine  0.1 mg Oral BH-qamhs   Followed by  . [START ON 11/30/2019] cloNIDine  0.1 mg Oral QAC breakfast  . enoxaparin (LOVENOX) injection  40 mg Subcutaneous Daily  . pantoprazole  40 mg Oral BID AC  . potassium chloride  20 mEq Oral BID   Continuous Infusions: . ceFEPime (MAXIPIME) IV 2 g (11/27/19 0557)     LOS: 1 day      Time spent: 40 minutes   Dessa Phi, DO [facility] 11/27/2019, 11:35 AM   Available via Epic secure chat 7am-7pm After these hours, please refer to coverage provider listed on [email or web address]

## 2019-11-28 ENCOUNTER — Inpatient Hospital Stay (HOSPITAL_COMMUNITY): Payer: Medicare Other

## 2019-11-28 LAB — BASIC METABOLIC PANEL
Anion gap: 10 (ref 5–15)
BUN: 12 mg/dL (ref 6–20)
CO2: 25 mmol/L (ref 22–32)
Calcium: 9.3 mg/dL (ref 8.9–10.3)
Chloride: 101 mmol/L (ref 98–111)
Creatinine, Ser: 0.85 mg/dL (ref 0.61–1.24)
GFR calc Af Amer: >60 mL/min (ref >60)
GFR calc non Af Amer: >60 mL/min (ref >60)
Glucose, Bld: 93 mg/dL (ref 70–99)
Potassium: 4.7 mmol/L (ref 3.5–5.1)
Sodium: 136 mmol/L (ref 135–145)

## 2019-11-28 LAB — CBC
HCT: 44.8 % (ref 39.0–52.0)
Hemoglobin: 13.9 g/dL (ref 13.0–17.0)
MCH: 26.8 pg (ref 26.0–34.0)
MCHC: 31 g/dL (ref 30.0–36.0)
MCV: 86.3 fL (ref 80.0–100.0)
Platelets: 290 10*3/uL (ref 150–400)
RBC: 5.19 MIL/uL (ref 4.22–5.81)
RDW: 15.9 % — ABNORMAL HIGH (ref 11.5–15.5)
WBC: 10.9 10*3/uL — ABNORMAL HIGH (ref 4.0–10.5)
nRBC: 0 % (ref 0.0–0.2)

## 2019-11-28 NOTE — Progress Notes (Signed)
PROGRESS NOTE    John Davenport  TKZ:[id number] DOB: Mar 18, 1976 DOA: 11/25/2019 PCP: Patient, No Pcp Per     Brief Narrative:  John Davenport is a 44 y.o. male with medical history significant for IV drug use, paroxysmal ventricular tachycardia with history of ICD placement and removal in 06/2018 due to MSSA bacteremia with vegetation on device, CAD s/p P CABG x5, OSA not on CPAP, chronic combined systolic and diastolic heart failure, CVA, CKD stage III, hepatitis C who presents with concerns of chest pain. Patient presented with similar symptoms of chest pain on 8/26 to the ED.  He was noted to have a troponin of 11 with no ischemic changes seen on EKG.  Had mild leukocytosis of 16.5 but was afebrile without tachycardia.  He was also noted to have left upper extremity abscess that was drained in the ED.  There was plan for admission for IV antibiotics but patient left AMA. Blood culture obtained at that time later now positive for Serratia. Patient now admitted for bacteremia.   New events last 24 hours / Subjective: No new complaints, awaiting TEE which has been scheduled for Wednesday.  Assessment & Plan:   Principal Problem:   Bacteremia Active Problems:   Chronic combined systolic and diastolic CHF (congestive heart failure) (HCC)   Abscess   Hypokalemia   IV drug user   Serratia bacteremia in setting of IV drug abuse -Echocardiogram revealed possible mural thrombus -TEE scheduled for Wednesday 9/1 -Continue IV cefepime -Infectious disease following  Left upper extremity abscess -Status post I&D in the emergency department 8/26 with packing in place -Check ultrasound left upper extremity  Chronic combined systolic and diastolic heart failure -EF of 15-20%  -ICD removed in April 2020 due to MSSA bacteremia. Deemed not to be a candidate for replacement due to continued IV drug use  IV drug abuse -Endorsed heroin use 2 days prior to admission -Continue clonidine  withdrawal protocol. Continue Suboxone   GERD -Protonix   DVT prophylaxis:  enoxaparin (LOVENOX) injection 40 mg Start: 11/26/19 1000  Code Status: Full code Family Communication: Mother at bedside Disposition Plan:   Status is: Inpatient  Remains inpatient appropriate because:Ongoing diagnostic testing needed not appropriate for outpatient work up, IV treatments appropriate due to intensity of illness or inability to take PO and Inpatient level of care appropriate due to severity of illness   Dispo: The patient is from: Home              Anticipated d/c is to: Home              Anticipated d/c date is: > 3 days              Patient currently is not medically stable to d/c. Remains on IV cefepime, TEE planned for 9/1   Consultants:   ID  Procedures:   TEE 9/1  Antimicrobials:  Anti-infectives (From admission, onward)   Start     Dose/Rate Route Frequency Ordered Stop   11/25/19 2200  ceFEPIme (MAXIPIME) 2 g in sodium chloride 0.9 % 100 mL IVPB        2 g 200 mL/hr over 30 Minutes Intravenous Every 8 hours 11/25/19 2051     11/25/19 2100  cefTRIAXone (ROCEPHIN) 2 g in sodium chloride 0.9 % 100 mL IVPB  Status:  Discontinued        2 g 200 mL/hr over 30 Minutes Intravenous  Once 11/25/19 2046 11/25/19 2051   11/25/19 2045  vancomycin (VANCOREADY) IVPB 1500 mg/300 mL  Status:  Discontinued        1,500 mg 150 mL/hr over 120 Minutes Intravenous  Once 11/25/19 2046 11/25/19 2051       Objective: Vitals:   11/27/19 2317 11/28/19 0311 11/28/19 0746 11/28/19 1107  BP: 90/71 105/70 105/64 108/77  Pulse: 68 65 64 68  Resp: 18 18    Temp: 98 F (36.7 C) 98 F (36.7 C) 97.6 F (36.4 C) 97.6 F (36.4 C)  TempSrc: Oral Oral Oral Axillary  SpO2: 98% 98% 98%   Weight:  74.5 kg    Height:        Intake/Output Summary (Last 24 hours) at 11/28/2019 1136 Last data filed at 11/28/2019 1100 Gross per 24 hour  Intake 675 ml  Output 500 ml  Net 175 ml   Filed Weights    11/26/19 1637 11/27/19 0433 11/28/19 0311  Weight: 74.8 kg 75.7 kg 74.5 kg    Examination: General exam: Appears calm and comfortable  Respiratory system: Clear to auscultation. Respiratory effort normal. Cardiovascular system: S1 & S2 heard, RRR. No pedal edema. Gastrointestinal system: Abdomen is nondistended, soft and nontender. Normal bowel sounds heard. Central nervous system: Alert and oriented. Non focal exam. Speech clear  Extremities: Symmetric in appearance bilaterally  Psychiatry: Stable  Data Reviewed: I have personally reviewed following labs and imaging studies  CBC: Recent Labs  Lab 11/24/19 1113 11/25/19 2003 11/26/19 0539 11/27/19 0327 11/28/19 0232  WBC 16.5* 17.5* 17.0* 13.6* 10.9*  NEUTROABS  --  14.7*  --  8.1*  --   HGB 13.8 14.8 13.9 12.1* 13.9  HCT 43.9 46.2 42.5 38.1* 44.8  MCV 85.9 82.9 82.8 83.0 86.3  PLT 331 354 421* 377 875   Basic Metabolic Panel: Recent Labs  Lab 11/24/19 1113 11/25/19 2003 11/26/19 0539 11/27/19 0327 11/28/19 0232  NA 135 135 136 135 136  K 3.4* 3.1* 3.4* 4.2 4.7  CL 100 99 102 104 101  CO2 23 20* 20* 23 25  GLUCOSE 105* 125* 104* 93 93  BUN 15 16 15 14 12   CREATININE 0.86 0.88 0.84 0.98 0.85  CALCIUM 9.0 9.5 9.1 8.8* 9.3  MG  --   --   --  1.6*  --   PHOS  --   --   --  2.5  --    GFR: Estimated Creatinine Clearance: 114.4 mL/min (by C-G formula based on SCr of 0.85 mg/dL). Liver Function Tests: Recent Labs  Lab 11/25/19 2003 11/27/19 0327  AST 20 18  ALT 15 12  ALKPHOS 257* 152*  BILITOT 0.8 0.6  PROT 9.2* 6.8  ALBUMIN 2.9* 2.2*   No results for input(s): LIPASE, AMYLASE in the last 168 hours. No results for input(s): AMMONIA in the last 168 hours. Coagulation Profile: No results for input(s): INR, PROTIME in the last 168 hours. Cardiac Enzymes: No results for input(s): CKTOTAL, CKMB, CKMBINDEX, TROPONINI in the last 168 hours. BNP (last 3 results) No results for input(s): PROBNP in the last 8760  hours. HbA1C: No results for input(s): HGBA1C in the last 72 hours. CBG: No results for input(s): GLUCAP in the last 168 hours. Lipid Profile: No results for input(s): CHOL, HDL, LDLCALC, TRIG, CHOLHDL, LDLDIRECT in the last 72 hours. Thyroid Function Tests: No results for input(s): TSH, T4TOTAL, FREET4, T3FREE, THYROIDAB in the last 72 hours. Anemia Panel: No results for input(s): VITAMINB12, FOLATE, FERRITIN, TIBC, IRON, RETICCTPCT in the last 72 hours. Sepsis Labs: Recent  Labs  Lab 11/24/19 1113 11/25/19 2003 11/25/19 2124  LATICACIDVEN 1.5 1.8 1.8    Recent Results (from the past 240 hour(s))  Blood culture (routine x 2)     Status: Abnormal   Collection Time: 11/24/19 11:05 AM   Specimen: BLOOD  Result Value Ref Range Status   Specimen Description   Final    BLOOD RIGHT ANTECUBITAL Performed at [facility], [address] [place], [place] [address]    Special Requests   Final    BOTTLES DRAWN AEROBIC AND ANAEROBIC Blood Culture adequate volume Performed at [facility], [address] [place], [place] [address]    Culture  Setup Time   Final    GRAM NEGATIVE RODS IN BOTH AEROBIC AND ANAEROBIC BOTTLES Organism ID to follow CRITICAL RESULT CALLED TO, READ BACK BY AND VERIFIED WITH: Rosemarie Ax RN 10:25 11/25/19 ([email or web address]) Performed at [facility], [place] [address] [place],  [address]    Culture SERRATIA MARCESCENS (A)  Final   Report Status 11/27/2019 FINAL  Final   Organism ID, Bacteria SERRATIA MARCESCENS  Final      Susceptibility   Serratia marcescens - MIC*    CEFAZOLIN >=64 RESISTANT Resistant     CEFEPIME <=0.12 SENSITIVE Sensitive     CEFTAZIDIME <=1 SENSITIVE Sensitive     CEFTRIAXONE <=0.25 SENSITIVE Sensitive     CIPROFLOXACIN <=0.25 SENSITIVE Sensitive     GENTAMICIN <=1 SENSITIVE Sensitive     TRIMETH/SULFA <=20 SENSITIVE Sensitive     * SERRATIA MARCESCENS  Blood Culture ID Panel (Reflexed)     Status: Abnormal    Collection Time: 11/24/19 11:05 AM  Result Value Ref Range Status   Enterococcus faecalis NOT DETECTED NOT DETECTED Final   Enterococcus Faecium NOT DETECTED NOT DETECTED Final   Listeria monocytogenes NOT DETECTED NOT DETECTED Final   Staphylococcus species NOT DETECTED NOT DETECTED Final   Staphylococcus aureus (BCID) NOT DETECTED NOT DETECTED Final   Staphylococcus epidermidis NOT DETECTED NOT DETECTED Final   Staphylococcus lugdunensis NOT DETECTED NOT DETECTED Final   Streptococcus species NOT DETECTED NOT DETECTED Final   Streptococcus agalactiae NOT DETECTED NOT DETECTED Final   Streptococcus pneumoniae NOT DETECTED NOT DETECTED Final   Streptococcus pyogenes NOT DETECTED NOT DETECTED Final   A.calcoaceticus-baumannii NOT DETECTED NOT DETECTED Final   Bacteroides fragilis NOT DETECTED NOT DETECTED Final   Enterobacterales DETECTED (A) NOT DETECTED Final    Comment: Enterobacterales represent a large order of gram negative bacteria, not a single organism. CRITICAL RESULT CALLED TO, READ BACK BY AND VERIFIED WITH: Rosemarie Ax RN 10:25 11/25/19 ([email or web address])    Enterobacter cloacae complex NOT DETECTED NOT DETECTED Final   Escherichia coli NOT DETECTED NOT DETECTED Final   Klebsiella aerogenes NOT DETECTED NOT DETECTED Final   Klebsiella oxytoca NOT DETECTED NOT DETECTED Final   Klebsiella pneumoniae NOT DETECTED NOT DETECTED Final   Proteus species NOT DETECTED NOT DETECTED Final   Salmonella species NOT DETECTED NOT DETECTED Final   Serratia marcescens DETECTED (A) NOT DETECTED Final    Comment: CRITICAL RESULT CALLED TO, READ BACK BY AND VERIFIED WITH: Rosemarie Ax RN 10:25 11/25/19 ([email or web address])    Haemophilus influenzae NOT DETECTED NOT DETECTED Final   Neisseria meningitidis NOT DETECTED NOT DETECTED Final   Pseudomonas aeruginosa NOT DETECTED NOT DETECTED Final   Stenotrophomonas maltophilia NOT DETECTED NOT DETECTED Final   Candida albicans NOT DETECTED NOT DETECTED Final   Candida  auris NOT DETECTED  NOT DETECTED Final   Candida glabrata NOT DETECTED NOT DETECTED Final   Candida krusei NOT DETECTED NOT DETECTED Final   Candida parapsilosis NOT DETECTED NOT DETECTED Final   Candida tropicalis NOT DETECTED NOT DETECTED Final   Cryptococcus neoformans/gattii NOT DETECTED NOT DETECTED Final   CTX-M ESBL NOT DETECTED NOT DETECTED Final   Carbapenem resistance IMP NOT DETECTED NOT DETECTED Final   Carbapenem resistance KPC NOT DETECTED NOT DETECTED Final   Carbapenem resistance NDM NOT DETECTED NOT DETECTED Final   Carbapenem resist OXA 48 LIKE NOT DETECTED NOT DETECTED Final   Carbapenem resistance VIM NOT DETECTED NOT DETECTED Final    Comment: Performed at [facility], [place] [address], [place], [place] [address]  Wound or Superficial Culture     Status: None   Collection Time: 11/24/19 11:57 AM   Specimen: Abscess; Wound  Result Value Ref Range Status   Specimen Description   Final    ABSCESS Performed at [facility], [address] [place], [place] [address]    Special Requests   Final    NONE Performed at [facility], [address] [place], [place] [address]    Gram Stain   Final    ABUNDANT WBC PRESENT,BOTH PMN AND MONONUCLEAR MODERATE GRAM POSITIVE COCCI FEW GRAM NEGATIVE RODS Performed at [facility], [place] [address], [place], [place] [address]    Culture FEW KLEBSIELLA OXYTOCA FEW SERRATIA MARCESCENS   Final   Report Status 11/27/2019 FINAL  Final   Organism ID, Bacteria KLEBSIELLA OXYTOCA  Final   Organism ID, Bacteria SERRATIA MARCESCENS  Final      Susceptibility   Klebsiella oxytoca - MIC*    AMPICILLIN RESISTANT Resistant     CEFAZOLIN <=4 SENSITIVE Sensitive     CEFEPIME <=0.12 SENSITIVE Sensitive     CEFTAZIDIME <=1 SENSITIVE Sensitive     CEFTRIAXONE <=0.25 SENSITIVE Sensitive     CIPROFLOXACIN <=0.25 SENSITIVE Sensitive     GENTAMICIN <=1 SENSITIVE Sensitive     IMIPENEM <=0.25 SENSITIVE Sensitive      TRIMETH/SULFA <=20 SENSITIVE Sensitive     AMPICILLIN/SULBACTAM 8 SENSITIVE Sensitive     PIP/TAZO <=4 SENSITIVE Sensitive     * FEW KLEBSIELLA OXYTOCA   Serratia marcescens - MIC*    CEFAZOLIN >=64 RESISTANT Resistant     CEFEPIME <=0.12 SENSITIVE Sensitive     CEFTAZIDIME <=1 SENSITIVE Sensitive     CEFTRIAXONE <=0.25 SENSITIVE Sensitive     CIPROFLOXACIN <=0.25 SENSITIVE Sensitive     GENTAMICIN <=1 SENSITIVE Sensitive     TRIMETH/SULFA <=20 SENSITIVE Sensitive     * FEW SERRATIA MARCESCENS  Blood culture (routine x 2)     Status: None (Preliminary result)   Collection Time: 11/25/19  9:23 PM   Specimen: BLOOD LEFT HAND  Result Value Ref Range Status   Specimen Description BLOOD LEFT HAND  Final   Special Requests   Final    BOTTLES DRAWN AEROBIC AND ANAEROBIC Blood Culture adequate volume   Culture   Final    NO GROWTH 3 DAYS Performed at [facility], [address], [place], [place] [address]    Report Status PENDING  Incomplete  Culture, blood (single)     Status: None (Preliminary result)   Collection Time: 11/25/19  9:53 PM   Specimen: BLOOD  Result Value Ref Range Status   Specimen Description BLOOD SITE NOT SPECIFIED  Final   Special Requests  Final    BOTTLES DRAWN AEROBIC AND ANAEROBIC Blood Culture adequate volume   Culture  Setup Time   Final    GRAM NEGATIVE RODS ANAEROBIC BOTTLE ONLY CRITICAL VALUE NOTED.  VALUE IS CONSISTENT WITH PREVIOUSLY REPORTED AND CALLED VALUE. Performed at [facility], [place] [address], [place], [place] [address]    Culture GRAM NEGATIVE RODS  Final   Report Status PENDING  Incomplete  SARS Coronavirus 2 by RT PCR (hospital order, performed in [facility] hospital lab) Nasopharyngeal Nasopharyngeal Swab     Status: None   Collection Time: 11/26/19 12:06 AM   Specimen: Nasopharyngeal Swab  Result Value Ref Range Status   SARS Coronavirus 2 NEGATIVE NEGATIVE Final    Comment: (NOTE) SARS-CoV-2 target nucleic acids  are NOT DETECTED.  The SARS-CoV-2 RNA is generally detectable in upper and lower respiratory specimens during the acute phase of infection. The lowest concentration of SARS-CoV-2 viral copies this assay can detect is 250 copies / mL. A negative result does not preclude SARS-CoV-2 infection and should not be used as the sole basis for treatment or other patient management decisions.  A negative result may occur with improper specimen collection / handling, submission of specimen other than nasopharyngeal swab, presence of viral mutation(s) within the areas targeted by this assay, and inadequate number of viral copies (<250 copies / mL). A negative result must be combined with clinical observations, patient history, and epidemiological information.  Fact Sheet for Patients:   StrictlyIdeas.no  Fact Sheet for Healthcare Providers: BankingDealers.co.za  This test is not yet approved or  cleared by the [place] FDA and has been authorized for detection and/or diagnosis of SARS-CoV-2 by FDA under an Emergency Use Authorization (EUA).  This EUA will remain in effect (meaning this test can be used) for the duration of the COVID-19 declaration under Section 564(b)(1) of the Act, 21 U.S.C. section 360bbb-3(b)(1), unless the authorization is terminated or revoked sooner.  Performed at [facility], [place] [address] [place], [place] [address]   Blood culture (routine x 2)     Status: None (Preliminary result)   Collection Time: 11/26/19  5:39 AM   Specimen: BLOOD  Result Value Ref Range Status   Specimen Description BLOOD SITE NOT SPECIFIED  Final   Special Requests   Final    BOTTLES DRAWN AEROBIC AND ANAEROBIC Blood Culture adequate volume   Culture   Final    NO GROWTH 2 DAYS Performed at [facility], [address], [place], [place] [address]    Report Status PENDING  Incomplete      Radiology Studies: No results  found.    Scheduled Meds: . buprenorphine-naloxone  1 tablet Sublingual BID  . cloNIDine  0.1 mg Oral BH-qamhs   Followed by  . [START ON 11/30/2019] cloNIDine  0.1 mg Oral QAC breakfast  . enoxaparin (LOVENOX) injection  40 mg Subcutaneous Daily  . nicotine  14 mg Transdermal Daily  . pantoprazole  40 mg Oral BID AC   Continuous Infusions: . sodium chloride    . ceFEPime (MAXIPIME) IV 2 g (11/28/19 3299)     LOS: 2 days      Time spent: 25 minutes   Dessa Phi, DO [facility] 11/28/2019, 11:36 AM   Available via Epic secure chat 7am-7pm After these hours, please refer to coverage provider listed on [email or web address]

## 2019-11-29 LAB — BASIC METABOLIC PANEL
Anion gap: 9 (ref 5–15)
BUN: 11 mg/dL (ref 6–20)
CO2: 27 mmol/L (ref 22–32)
Calcium: 8.9 mg/dL (ref 8.9–10.3)
Chloride: 100 mmol/L (ref 98–111)
Creatinine, Ser: 0.95 mg/dL (ref 0.61–1.24)
GFR calc Af Amer: >60 mL/min (ref >60)
GFR calc non Af Amer: >60 mL/min (ref >60)
Glucose, Bld: 88 mg/dL (ref 70–99)
Potassium: 4.3 mmol/L (ref 3.5–5.1)
Sodium: 136 mmol/L (ref 135–145)

## 2019-11-29 LAB — CBC
HCT: 39.3 % (ref 39.0–52.0)
Hemoglobin: 12 g/dL — ABNORMAL LOW (ref 13.0–17.0)
MCH: 26.5 pg (ref 26.0–34.0)
MCHC: 30.5 g/dL (ref 30.0–36.0)
MCV: 86.8 fL (ref 80.0–100.0)
Platelets: 315 10*3/uL (ref 150–400)
RBC: 4.53 MIL/uL (ref 4.22–5.81)
RDW: 15.9 % — ABNORMAL HIGH (ref 11.5–15.5)
WBC: 12.7 10*3/uL — ABNORMAL HIGH (ref 4.0–10.5)
nRBC: 0 % (ref 0.0–0.2)

## 2019-11-29 LAB — CULTURE, BLOOD (SINGLE): Special Requests: ADEQUATE

## 2019-11-29 LAB — MAGNESIUM: Magnesium: 1.8 mg/dL (ref 1.7–2.4)

## 2019-11-29 MED ORDER — SODIUM CHLORIDE 0.9 % IV SOLN: INTRAVENOUS | Status: DC

## 2019-11-29 NOTE — Plan of Care (Signed)

## 2019-11-29 NOTE — Progress Notes (Signed)
PROGRESS NOTE    John Davenport  KPT:[id number] DOB: Mar 26, 1976 DOA: 11/25/2019 PCP: Patient, No Pcp Per     Brief Narrative:  John Davenport is a 44 y.o. male with medical history significant for IV drug use, paroxysmal ventricular tachycardia with history of ICD placement and removal in 06/2018 due to MSSA bacteremia with vegetation on device, CAD s/p P CABG x5, OSA not on CPAP, chronic combined systolic and diastolic heart failure, CVA, CKD stage III, hepatitis C who presents with concerns of chest pain. Patient presented with similar symptoms of chest pain on 8/26 to the ED.  He was noted to have a troponin of 11 with no ischemic changes seen on EKG.  Had mild leukocytosis of 16.5 but was afebrile without tachycardia.  He was also noted to have left upper extremity abscess that was drained in the ED.  There was plan for admission for IV antibiotics but patient left AMA. Blood culture obtained at that time later now positive for Serratia. Patient now admitted for bacteremia.   New events last 24 hours / Subjective: Complains of withdrawal symptoms, wants subxone increased   Assessment & Plan:   Principal Problem:   Bacteremia Active Problems:   Chronic combined systolic and diastolic CHF (congestive heart failure) (HCC)   Abscess   Hypokalemia   IV drug user   Serratia bacteremia in setting of IV drug abuse -Echocardiogram revealed possible mural thrombus -TEE scheduled for Wednesday 9/1 -Continue IV cefepime -Infectious disease following  Left upper extremity abscess -Status post I&D in the emergency department 8/26 with packing in place -Ultrasound left upper extremity: No definable fluid collection at the site of the wound in the left antecubital fossa.  Chronic combined systolic and diastolic heart failure -EF of 15-20%  -ICD removed in April 2020 due to MSSA bacteremia. Deemed not to be a candidate for replacement due to continued IV drug use  IV drug  abuse -Endorsed heroin use 2 days prior to admission -Continue clonidine withdrawal protocol. Continue Suboxone   GERD -Protonix   DVT prophylaxis:  enoxaparin (LOVENOX) injection 40 mg Start: 11/26/19 1000  Code Status: Full code Family Communication: No family at bedside Disposition Plan:   Status is: Inpatient  Remains inpatient appropriate because:Ongoing diagnostic testing needed not appropriate for outpatient work up, IV treatments appropriate due to intensity of illness or inability to take PO and Inpatient level of care appropriate due to severity of illness   Dispo: The patient is from: Home              Anticipated d/c is to: Home              Anticipated d/c date is: > 3 days              Patient currently is not medically stable to d/c. Remains on IV cefepime, TEE planned for 9/1   Consultants:   ID  Procedures:   TEE 9/1  Antimicrobials:  Anti-infectives (From admission, onward)   Start     Dose/Rate Route Frequency Ordered Stop   11/25/19 2200  ceFEPIme (MAXIPIME) 2 g in sodium chloride 0.9 % 100 mL IVPB        2 g 200 mL/hr over 30 Minutes Intravenous Every 8 hours 11/25/19 2051     11/25/19 2100  cefTRIAXone (ROCEPHIN) 2 g in sodium chloride 0.9 % 100 mL IVPB  Status:  Discontinued        2 g 200 mL/hr over 30 Minutes  Intravenous  Once 11/25/19 2046 11/25/19 2051   11/25/19 2045  vancomycin (VANCOREADY) IVPB 1500 mg/300 mL  Status:  Discontinued        1,500 mg 150 mL/hr over 120 Minutes Intravenous  Once 11/25/19 2046 11/25/19 2051       Objective: Vitals:   11/28/19 2100 11/28/19 2300 11/29/19 0600 11/29/19 0853  BP: 97/61 100/67 106/67 105/68  Pulse: 77 72 76   Resp: 20 18 18 16   Temp: 97.8 F (36.6 C) 98.2 F (36.8 C) 98 F (36.7 C)   TempSrc: Oral Oral Oral   SpO2: 98% 99% 96%   Weight:      Height:        Intake/Output Summary (Last 24 hours) at 11/29/2019 1212 Last data filed at 11/29/2019 1200 Gross per 24 hour  Intake 682 ml   Output --  Net 682 ml   Filed Weights   11/26/19 1637 11/27/19 0433 11/28/19 0311  Weight: 74.8 kg 75.7 kg 74.5 kg    Examination: General exam: Appears calm and comfortable  Respiratory system: Clear to auscultation. Respiratory effort normal. Cardiovascular system: S1 & S2 heard, RRR. No pedal edema. Gastrointestinal system: Abdomen is nondistended, soft and nontender. Normal bowel sounds heard. Central nervous system: Alert and oriented. Non focal exam. Speech clear  Extremities: Symmetric in appearance bilaterally  Skin: No rashes, lesions or ulcers on exposed skin  Psychiatry: Judgement and insight appear poor  Data Reviewed: I have personally reviewed following labs and imaging studies  CBC: Recent Labs  Lab 11/25/19 2003 11/26/19 0539 11/27/19 0327 11/28/19 0232 11/29/19 0334  WBC 17.5* 17.0* 13.6* 10.9* 12.7*  NEUTROABS 14.7*  --  8.1*  --   --   HGB 14.8 13.9 12.1* 13.9 12.0*  HCT 46.2 42.5 38.1* 44.8 39.3  MCV 82.9 82.8 83.0 86.3 86.8  PLT 354 421* 377 290 371   Basic Metabolic Panel: Recent Labs  Lab 11/25/19 2003 11/26/19 0539 11/27/19 0327 11/28/19 0232 11/29/19 0334  NA 135 136 135 136 136  K 3.1* 3.4* 4.2 4.7 4.3  CL 99 102 104 101 100  CO2 20* 20* 23 25 27   GLUCOSE 125* 104* 93 93 88  BUN 16 15 14 12 11   CREATININE 0.88 0.84 0.98 0.85 0.95  CALCIUM 9.5 9.1 8.8* 9.3 8.9  MG  --   --  1.6*  --  1.8  PHOS  --   --  2.5  --   --    GFR: Estimated Creatinine Clearance: 102.4 mL/min (by C-G formula based on SCr of 0.95 mg/dL). Liver Function Tests: Recent Labs  Lab 11/25/19 2003 11/27/19 0327  AST 20 18  ALT 15 12  ALKPHOS 257* 152*  BILITOT 0.8 0.6  PROT 9.2* 6.8  ALBUMIN 2.9* 2.2*   No results for input(s): LIPASE, AMYLASE in the last 168 hours. No results for input(s): AMMONIA in the last 168 hours. Coagulation Profile: No results for input(s): INR, PROTIME in the last 168 hours. Cardiac Enzymes: No results for input(s): CKTOTAL,  CKMB, CKMBINDEX, TROPONINI in the last 168 hours. BNP (last 3 results) No results for input(s): PROBNP in the last 8760 hours. HbA1C: No results for input(s): HGBA1C in the last 72 hours. CBG: No results for input(s): GLUCAP in the last 168 hours. Lipid Profile: No results for input(s): CHOL, HDL, LDLCALC, TRIG, CHOLHDL, LDLDIRECT in the last 72 hours. Thyroid Function Tests: No results for input(s): TSH, T4TOTAL, FREET4, T3FREE, THYROIDAB in the last 72 hours. Anemia  Panel: No results for input(s): VITAMINB12, FOLATE, FERRITIN, TIBC, IRON, RETICCTPCT in the last 72 hours. Sepsis Labs: Recent Labs  Lab 11/24/19 1113 11/25/19 2003 11/25/19 2124  LATICACIDVEN 1.5 1.8 1.8    Recent Results (from the past 240 hour(s))  Blood culture (routine x 2)     Status: Abnormal   Collection Time: 11/24/19 11:05 AM   Specimen: BLOOD  Result Value Ref Range Status   Specimen Description   Final    BLOOD RIGHT ANTECUBITAL Performed at [facility], [address] [place], [place] [address]    Special Requests   Final    BOTTLES DRAWN AEROBIC AND ANAEROBIC Blood Culture adequate volume Performed at [facility], [address] [place], [place] [address]    Culture  Setup Time   Final    GRAM NEGATIVE RODS IN BOTH AEROBIC AND ANAEROBIC BOTTLES Organism ID to follow CRITICAL RESULT CALLED TO, READ BACK BY AND VERIFIED WITH: Rosemarie Ax RN 10:25 11/25/19 ([email or web address]) Performed at [facility], [place] [address] [place], [place] [address]    Culture SERRATIA MARCESCENS (A)  Final   Report Status 11/27/2019 FINAL  Final   Organism ID, Bacteria SERRATIA MARCESCENS  Final      Susceptibility   Serratia marcescens - MIC*    CEFAZOLIN >=64 RESISTANT Resistant     CEFEPIME <=0.12 SENSITIVE Sensitive     CEFTAZIDIME <=1 SENSITIVE Sensitive     CEFTRIAXONE <=0.25 SENSITIVE Sensitive     CIPROFLOXACIN <=0.25 SENSITIVE Sensitive     GENTAMICIN <=1 SENSITIVE Sensitive      TRIMETH/SULFA <=20 SENSITIVE Sensitive     * SERRATIA MARCESCENS  Blood Culture ID Panel (Reflexed)     Status: Abnormal   Collection Time: 11/24/19 11:05 AM  Result Value Ref Range Status   Enterococcus faecalis NOT DETECTED NOT DETECTED Final   Enterococcus Faecium NOT DETECTED NOT DETECTED Final   Listeria monocytogenes NOT DETECTED NOT DETECTED Final   Staphylococcus species NOT DETECTED NOT DETECTED Final   Staphylococcus aureus (BCID) NOT DETECTED NOT DETECTED Final   Staphylococcus epidermidis NOT DETECTED NOT DETECTED Final   Staphylococcus lugdunensis NOT DETECTED NOT DETECTED Final   Streptococcus species NOT DETECTED NOT DETECTED Final   Streptococcus agalactiae NOT DETECTED NOT DETECTED Final   Streptococcus pneumoniae NOT DETECTED NOT DETECTED Final   Streptococcus pyogenes NOT DETECTED NOT DETECTED Final   A.calcoaceticus-baumannii NOT DETECTED NOT DETECTED Final   Bacteroides fragilis NOT DETECTED NOT DETECTED Final   Enterobacterales DETECTED (A) NOT DETECTED Final    Comment: Enterobacterales represent a large order of gram negative bacteria, not a single organism. CRITICAL RESULT CALLED TO, READ BACK BY AND VERIFIED WITH: Rosemarie Ax RN 10:25 11/25/19 ([email or web address])    Enterobacter cloacae complex NOT DETECTED NOT DETECTED Final   Escherichia coli NOT DETECTED NOT DETECTED Final   Klebsiella aerogenes NOT DETECTED NOT DETECTED Final   Klebsiella oxytoca NOT DETECTED NOT DETECTED Final   Klebsiella pneumoniae NOT DETECTED NOT DETECTED Final   Proteus species NOT DETECTED NOT DETECTED Final   Salmonella species NOT DETECTED NOT DETECTED Final   Serratia marcescens DETECTED (A) NOT DETECTED Final    Comment: CRITICAL RESULT CALLED TO, READ BACK BY AND VERIFIED WITH: Rosemarie Ax RN 10:25 11/25/19 ([email or web address])    Haemophilus influenzae NOT DETECTED NOT DETECTED Final   Neisseria meningitidis NOT DETECTED NOT DETECTED Final   Pseudomonas aeruginosa NOT DETECTED NOT DETECTED Final    Stenotrophomonas maltophilia NOT  DETECTED NOT DETECTED Final   Candida albicans NOT DETECTED NOT DETECTED Final   Candida auris NOT DETECTED NOT DETECTED Final   Candida glabrata NOT DETECTED NOT DETECTED Final   Candida krusei NOT DETECTED NOT DETECTED Final   Candida parapsilosis NOT DETECTED NOT DETECTED Final   Candida tropicalis NOT DETECTED NOT DETECTED Final   Cryptococcus neoformans/gattii NOT DETECTED NOT DETECTED Final   CTX-M ESBL NOT DETECTED NOT DETECTED Final   Carbapenem resistance IMP NOT DETECTED NOT DETECTED Final   Carbapenem resistance KPC NOT DETECTED NOT DETECTED Final   Carbapenem resistance NDM NOT DETECTED NOT DETECTED Final   Carbapenem resist OXA 48 LIKE NOT DETECTED NOT DETECTED Final   Carbapenem resistance VIM NOT DETECTED NOT DETECTED Final    Comment: Performed at [facility], [address], [place], [place] [address]  Wound or Superficial Culture     Status: None   Collection Time: 11/24/19 11:57 AM   Specimen: Abscess; Wound  Result Value Ref Range Status   Specimen Description   Final    ABSCESS Performed at [facility], [address] [place], [place] [address]    Special Requests   Final    NONE Performed at [facility], [address] [place], [place] [address]    Gram Stain   Final    ABUNDANT WBC PRESENT,BOTH PMN AND MONONUCLEAR MODERATE GRAM POSITIVE COCCI FEW GRAM NEGATIVE RODS Performed at [facility], [address] [address], [place], [place] [address]    Culture FEW KLEBSIELLA OXYTOCA FEW SERRATIA MARCESCENS   Final   Report Status 11/27/2019 FINAL  Final   Organism ID, Bacteria KLEBSIELLA OXYTOCA  Final   Organism ID, Bacteria SERRATIA MARCESCENS  Final      Susceptibility   Klebsiella oxytoca - MIC*    AMPICILLIN RESISTANT Resistant     CEFAZOLIN <=4 SENSITIVE Sensitive     CEFEPIME <=0.12 SENSITIVE Sensitive     CEFTAZIDIME <=1 SENSITIVE Sensitive     CEFTRIAXONE <=0.25 SENSITIVE Sensitive      CIPROFLOXACIN <=0.25 SENSITIVE Sensitive     GENTAMICIN <=1 SENSITIVE Sensitive     IMIPENEM <=0.25 SENSITIVE Sensitive     TRIMETH/SULFA <=20 SENSITIVE Sensitive     AMPICILLIN/SULBACTAM 8 SENSITIVE Sensitive     PIP/TAZO <=4 SENSITIVE Sensitive     * FEW KLEBSIELLA OXYTOCA   Serratia marcescens - MIC*    CEFAZOLIN >=64 RESISTANT Resistant     CEFEPIME <=0.12 SENSITIVE Sensitive     CEFTAZIDIME <=1 SENSITIVE Sensitive     CEFTRIAXONE <=0.25 SENSITIVE Sensitive     CIPROFLOXACIN <=0.25 SENSITIVE Sensitive     GENTAMICIN <=1 SENSITIVE Sensitive     TRIMETH/SULFA <=20 SENSITIVE Sensitive     * FEW SERRATIA MARCESCENS  Blood culture (routine x 2)     Status: None (Preliminary result)   Collection Time: 11/25/19  9:23 PM   Specimen: BLOOD LEFT HAND  Result Value Ref Range Status   Specimen Description BLOOD LEFT HAND  Final   Special Requests   Final    BOTTLES DRAWN AEROBIC AND ANAEROBIC Blood Culture adequate volume   Culture   Final    NO GROWTH 4 DAYS Performed at [facility], [address] [place], [place] [address]    Report Status PENDING  Incomplete  Culture, blood (single)     Status: Abnormal   Collection Time: 11/25/19  9:53 PM   Specimen: BLOOD  Result Value Ref  Range Status   Specimen Description BLOOD SITE NOT SPECIFIED  Final   Special Requests   Final    BOTTLES DRAWN AEROBIC AND ANAEROBIC Blood Culture adequate volume   Culture  Setup Time   Final    GRAM NEGATIVE RODS ANAEROBIC BOTTLE ONLY CRITICAL VALUE NOTED.  VALUE IS CONSISTENT WITH PREVIOUSLY REPORTED AND CALLED VALUE.    Culture (A)  Final    SERRATIA MARCESCENS SUSCEPTIBILITIES PERFORMED ON PREVIOUS CULTURE WITHIN THE LAST 5 DAYS. Performed at [facility], [place] [address] [place], [place] [address]    Report Status 11/29/2019 FINAL  Final  SARS Coronavirus 2 by RT PCR (hospital order, performed in [facility] hospital lab) Nasopharyngeal Nasopharyngeal Swab     Status: None    Collection Time: 11/26/19 12:06 AM   Specimen: Nasopharyngeal Swab  Result Value Ref Range Status   SARS Coronavirus 2 NEGATIVE NEGATIVE Final    Comment: (NOTE) SARS-CoV-2 target nucleic acids are NOT DETECTED.  The SARS-CoV-2 RNA is generally detectable in upper and lower respiratory specimens during the acute phase of infection. The lowest concentration of SARS-CoV-2 viral copies this assay can detect is 250 copies / mL. A negative result does not preclude SARS-CoV-2 infection and should not be used as the sole basis for treatment or other patient management decisions.  A negative result may occur with improper specimen collection / handling, submission of specimen other than nasopharyngeal swab, presence of viral mutation(s) within the areas targeted by this assay, and inadequate number of viral copies (<250 copies / mL). A negative result must be combined with clinical observations, patient history, and epidemiological information.  Fact Sheet for Patients:   StrictlyIdeas.no  Fact Sheet for Healthcare Providers: BankingDealers.co.za  This test is not yet approved or  cleared by the [place] FDA and has been authorized for detection and/or diagnosis of SARS-CoV-2 by FDA under an Emergency Use Authorization (EUA).  This EUA will remain in effect (meaning this test can be used) for the duration of the COVID-19 declaration under Section 564(b)(1) of the Act, 21 U.S.C. section 360bbb-3(b)(1), unless the authorization is terminated or revoked sooner.  Performed at [facility], [place] [address], [place], [place] [address]   Blood culture (routine x 2)     Status: None (Preliminary result)   Collection Time: 11/26/19  5:39 AM   Specimen: BLOOD  Result Value Ref Range Status   Specimen Description BLOOD SITE NOT SPECIFIED  Final   Special Requests   Final    BOTTLES DRAWN AEROBIC AND ANAEROBIC Blood Culture adequate volume    Culture   Final    NO GROWTH 3 DAYS Performed at [facility], [address], [place], [place] [address]    Report Status PENDING  Incomplete      Radiology Studies: [place] LT UPPER EXTREM LTD SOFT TISSUE NON VASCULAR  Result Date: 11/28/2019 CLINICAL DATA:  Left arm abscess, swelling EXAM: ULTRASOUND LEFT UPPER EXTREMITY LIMITED TECHNIQUE: Ultrasound examination of the upper extremity soft tissues was performed in the area of clinical concern. COMPARISON:  None. FINDINGS: Real-time sonography of the left antecubital fossa was performed. Soft tissue edema around the wound. No definable fluid collection to suggest an abscess. No hematoma or soft tissue mass. IMPRESSION: No definable fluid collection at the site of the wound in the left antecubital fossa. Electronically Signed   By: Kathreen Devoid   On: 11/28/2019 13:31      Scheduled Meds:  buprenorphine-naloxone  1 tablet Sublingual BID   cloNIDine  0.1 mg Oral BH-qamhs   Followed by   Derrill Memo ON 11/30/2019] cloNIDine  0.1 mg Oral QAC breakfast   enoxaparin (LOVENOX) injection  40 mg Subcutaneous Daily   nicotine  14 mg Transdermal Daily   pantoprazole  40 mg Oral BID AC   Continuous Infusions:  sodium chloride     ceFEPime (MAXIPIME) IV 2 g (11/29/19 0548)     LOS: 3 days      Time spent: 25 minutes   Dessa Phi, DO [facility] 11/29/2019, 12:12 PM   Available via Epic secure chat 7am-7pm After these hours, please refer to coverage provider listed on [email or web address]

## 2019-11-29 NOTE — Progress Notes (Signed)
° ° °  [facility] has been requested to perform a transesophageal echocardiogram on [name] for bacteremia.  After careful review of history and examination, the risks and benefits of transesophageal echocardiogram have been explained including risks of esophageal damage, perforation (1:10,000 risk), bleeding, pharyngeal hematoma as well as other potential complications associated with conscious sedation including aspiration, arrhythmia, respiratory failure and death. Alternatives to treatment were discussed, questions were answered. Patient is willing to proceed.   Irisa Grimsley Ninfa Meeker, PA-C  11/29/2019 3:52 PM

## 2019-11-29 NOTE — Progress Notes (Signed)
Pt refuses bed alarm, despite being eduacaed on the reason for it. Pt educated on pt accountability if he falls. Pt verbalizes an understanding.

## 2019-11-30 ENCOUNTER — Encounter (HOSPITAL_COMMUNITY)
Admission: EM | Discharge status: Against Medical Advice | Payer: Self-pay | Source: Home / Self Care | Attending: Internal Medicine

## 2019-11-30 ENCOUNTER — Inpatient Hospital Stay (HOSPITAL_COMMUNITY): Payer: Medicare Other | Admitting: Certified Registered Nurse Anesthetist

## 2019-11-30 ENCOUNTER — Encounter (HOSPITAL_COMMUNITY): Payer: Self-pay | Admitting: Family Medicine

## 2019-11-30 ENCOUNTER — Inpatient Hospital Stay (HOSPITAL_COMMUNITY): Payer: Medicare Other

## 2019-11-30 DIAGNOSIS — I340 Nonrheumatic mitral (valve) insufficiency: Secondary | ICD-10-CM

## 2019-11-30 DIAGNOSIS — R7881 Bacteremia: Secondary | ICD-10-CM

## 2019-11-30 DIAGNOSIS — I361 Nonrheumatic tricuspid (valve) insufficiency: Secondary | ICD-10-CM

## 2019-11-30 HISTORY — PX: TEE WITHOUT CARDIOVERSION: SHX5443

## 2019-11-30 HISTORY — PX: BUBBLE STUDY: SHX6837

## 2019-11-30 LAB — CBC
HCT: 36.2 % — ABNORMAL LOW (ref 39.0–52.0)
Hemoglobin: 11.1 g/dL — ABNORMAL LOW (ref 13.0–17.0)
MCH: 26.5 pg (ref 26.0–34.0)
MCHC: 30.7 g/dL (ref 30.0–36.0)
MCV: 86.4 fL (ref 80.0–100.0)
Platelets: 274 10*3/uL (ref 150–400)
RBC: 4.19 MIL/uL — ABNORMAL LOW (ref 4.22–5.81)
RDW: 15.8 % — ABNORMAL HIGH (ref 11.5–15.5)
WBC: 11.1 10*3/uL — ABNORMAL HIGH (ref 4.0–10.5)
nRBC: 0 % (ref 0.0–0.2)

## 2019-11-30 LAB — CULTURE, BLOOD (ROUTINE X 2)
Culture: NO GROWTH
Special Requests: ADEQUATE

## 2019-11-30 LAB — BASIC METABOLIC PANEL
Anion gap: 8 (ref 5–15)
BUN: 13 mg/dL (ref 6–20)
CO2: 30 mmol/L (ref 22–32)
Calcium: 9 mg/dL (ref 8.9–10.3)
Chloride: 98 mmol/L (ref 98–111)
Creatinine, Ser: 0.91 mg/dL (ref 0.61–1.24)
GFR calc Af Amer: >60 mL/min (ref >60)
GFR calc non Af Amer: >60 mL/min (ref >60)
Glucose, Bld: 96 mg/dL (ref 70–99)
Potassium: 4.1 mmol/L (ref 3.5–5.1)
Sodium: 136 mmol/L (ref 135–145)

## 2019-11-30 LAB — MRSA PCR SCREENING: MRSA by PCR: NEGATIVE

## 2019-11-30 LAB — ECHO TEE
MV M vel: 4.51 m/s
MV Peak grad: 81.4 mmHg
Radius: 0.6 cm

## 2019-11-30 SURGERY — ECHOCARDIOGRAM, TRANSESOPHAGEAL
Anesthesia: General

## 2019-11-30 MED ORDER — PROPOFOL 10 MG/ML IV BOLUS
INTRAVENOUS | Status: DC | PRN
  Administered 2019-11-30 (×4): 15 mg via INTRAVENOUS
  Administered 2019-11-30: 10 mg via INTRAVENOUS

## 2019-11-30 MED ORDER — PHENYLEPHRINE 40 MCG/ML (10ML) SYRINGE FOR IV PUSH (FOR BLOOD PRESSURE SUPPORT)
PREFILLED_SYRINGE | INTRAVENOUS | Status: DC | PRN
  Administered 2019-11-30 (×3): 80 ug via INTRAVENOUS

## 2019-11-30 MED ORDER — PROPOFOL 500 MG/50ML IV EMUL
INTRAVENOUS | Status: DC | PRN
  Administered 2019-11-30: 25 ug/kg/min via INTRAVENOUS

## 2019-11-30 MED ORDER — SODIUM CHLORIDE 0.9 % IV SOLN: INTRAVENOUS | Status: DC | PRN

## 2019-11-30 MED ORDER — KETAMINE HCL 50 MG/5ML IJ SOSY
PREFILLED_SYRINGE | INTRAMUSCULAR | Status: AC
  Filled 2019-11-30: qty 5

## 2019-11-30 MED ORDER — KETAMINE HCL 10 MG/ML IJ SOLN
INTRAMUSCULAR | Status: DC | PRN
  Administered 2019-11-30: 20 mg via INTRAVENOUS
  Administered 2019-11-30: 10 mg via INTRAVENOUS

## 2019-11-30 MED ORDER — PERFLUTREN LIPID MICROSPHERE
1.0000 mL | INTRAVENOUS | Status: AC | PRN
  Administered 2019-11-30: 2 mL via INTRAVENOUS
  Filled 2019-11-30: qty 10

## 2019-11-30 MED ORDER — KETOROLAC TROMETHAMINE 15 MG/ML IJ SOLN
15.0000 mg | Freq: Once | INTRAMUSCULAR | Status: AC
  Administered 2019-11-30: 15 mg via INTRAVENOUS
  Filled 2019-11-30: qty 1

## 2019-11-30 MED ORDER — MIDAZOLAM HCL 5 MG/5ML IJ SOLN
INTRAMUSCULAR | Status: DC | PRN
  Administered 2019-11-30: 2 mg via INTRAVENOUS

## 2019-11-30 MED ORDER — BUTAMBEN-TETRACAINE-BENZOCAINE 2-2-14 % EX AERO
INHALATION_SPRAY | CUTANEOUS | Status: DC | PRN
  Administered 2019-11-30: 2 via TOPICAL

## 2019-11-30 NOTE — Progress Notes (Signed)
    [facility] Infectious Disease   Reason for visit: Follow up on Serratia bacteremia  Interval History: TEE negative for vegetation.    Physical Exam: Constitutional:  Vitals:   11/30/19 1211 11/30/19 1325  BP: (!) 97/59 94/61  Pulse:    Resp: 16 16  Temp:    SpO2:     patient appears in NAD  Impression: Serratia bacteremia.  Most recent repeat blood culture negative.  Day 6 of cefepime  Plan: 1.  Can continue with cefepime now and can discharge on levofloxacin 500 mg daily to complete 14 days total through September 9th.   He can follow up with his PCP or with [place] if concerns after discharge.   2. Hepatitis C - RNA negative for active infection  I will sign off, call with questions.

## 2019-11-30 NOTE — Anesthesia Procedure Notes (Signed)
Procedure Name: MAC Date/Time: 11/30/2019 8:35 AM Performed by: Colin Benton, CRNA Pre-anesthesia Checklist: Patient identified, Emergency Drugs available, Suction available and Patient being monitored Patient Re-evaluated:Patient Re-evaluated prior to induction Oxygen Delivery Method: Nasal cannula Preoxygenation: Pre-oxygenation with 100% oxygen Induction Type: IV induction Placement Confirmation: positive ETCO2 Dental Injury: Teeth and Oropharynx as per pre-operative assessment

## 2019-11-30 NOTE — CV Procedure (Signed)
INDICATIONS: bacteremia  PROCEDURE:   Informed consent was obtained prior to the procedure. The risks, benefits and alternatives for the procedure were discussed and the patient comprehended these risks.  Risks include, but are not limited to, cough, sore throat, vomiting, nausea, somnolence, esophageal and stomach trauma or perforation, bleeding, low blood pressure, aspiration, pneumonia, infection, trauma to the teeth and death.    After a procedural time-out, the oropharynx was anesthetized with 20% benzocaine spray.   During this procedure the patient was administered propofol per anesthesia [facility] CRNA, Dr. Nyoka Cowden Anesthesiologist).  The patient's heart rate, blood pressure, and oxygen saturation were monitored continuously during the procedure. The period of conscious sedation was 30 minutes, of which I was present face-to-face 100% of this time.  The transesophageal probe was inserted in the esophagus and stomach without difficulty and multiple views were obtained.  The patient was kept under observation until the patient left the procedure room.  The patient left the procedure room in stable condition.   Agitated microbubble saline contrast was administered.  COMPLICATIONS:    There were no immediate complications.  FINDINGS:   FORMAL ECHOCARDIOGRAM REPORT PENDING LVEF 15 % No valvular vegetations  Moderate MR ERO 0.23 cm, RV 36 mL, interrogation of one jet, however there are multiple small jets.  Limited transthoracic images obtained to answer question about apical mass. No findings to suggest mural thrombus.   RECOMMENDATIONS:     Can return to hospital room when stable.  Time Spent Directly with the Patient:  60 minutes   Corliss Coggeshall A Margaretann Loveless 11/30/2019, 9:19 AM

## 2019-11-30 NOTE — Anesthesia Preprocedure Evaluation (Addendum)
Anesthesia Evaluation  Patient identified by MRN, date of birth, ID band Patient awake    Reviewed: Allergy & Precautions, NPO status   Airway Mallampati: II  TM Distance: >3 FB     Dental  (+) Missing, Chipped, Dental Advisory Given,  Missing and chipped teeth.  Poor dentition.:   Pulmonary Current Smoker and Patient abstained from smoking.,    breath sounds clear to auscultation       Cardiovascular + CAD, + Past MI and +CHF   Rhythm:Regular Rate:Normal     Neuro/Psych Anxiety CVA    GI/Hepatic GERD  ,(+) Hepatitis -  Endo/Other    Renal/GU Renal disease     Musculoskeletal   Abdominal   Peds  Hematology   Anesthesia Other Findings   Reproductive/Obstetrics                            Anesthesia Physical Anesthesia Plan  ASA: III  Anesthesia Plan: General   Post-op Pain Management:    Induction: Intravenous  PONV Risk Score and Plan: 1 and Propofol infusion  Airway Management Planned: Nasal Cannula and Simple Face Mask  Additional Equipment:   Intra-op Plan:   Post-operative Plan:   Informed Consent: I have reviewed the patients History and Physical, chart, labs and discussed the procedure including the risks, benefits and alternatives for the proposed anesthesia with the patient or authorized representative who has indicated his/her understanding and acceptance.     Dental advisory given  Plan Discussed with: Anesthesiologist and CRNA  Anesthesia Plan Comments:         Anesthesia Quick Evaluation

## 2019-11-30 NOTE — Interval H&P Note (Signed)
History and Physical Interval Note:  11/30/2019 8:28 AM  John Davenport  has presented today for surgery, with the diagnosis of BACTEREMIA IV DRUG USE.  The various methods of treatment have been discussed with the patient and family. After consideration of risks, benefits and other options for treatment, the patient has consented to  Procedure(s): TRANSESOPHAGEAL ECHOCARDIOGRAM (TEE) (N/A) as a surgical intervention.  The patient's history has been reviewed, patient examined, no change in status, stable for surgery.  I have reviewed the patient's chart and labs.  Questions were answered to the patient's satisfaction.     Elouise Munroe

## 2019-11-30 NOTE — Progress Notes (Signed)
PROGRESS NOTE  John Davenport XLK:[id number] DOB: 10-Nov-1975 DOA: 11/25/2019 PCP: Patient, No Pcp Per  HPI/Recap of past 24 hours: John Davenport is a 44 y.o.malewith medical history significant forIV drug use, paroxysmal ventricular tachycardia with history of ICD placement and removal in 06/2018 due to MSSA bacteremia withvegetation on device, CAD s/p P CABG x5,OSAnot on CPAP, chronic combined systolic and diastolic heart failure, CVA, CKD stage III, hepatitis C who presents with concerns of chest pain. Patient presented with similar symptoms of chest pain on 8/26 to the ED. He was noted to have a troponin of 11 with no ischemic changes seen on EKG. Had mild leukocytosis of 16.5 but was afebrile without tachycardia. He was also noted to have left upper extremity abscess that was drained in the ED. There was plan for admission for IV antibiotics but patient left AMA. Blood culture obtained at that time laternowpositive for Serratia. Patient now admitted for bacteremia.   11/30/19: Pain in his left upper extremity at dorsal region with possible developing abscess.  Abscess at [facility] site is post I&D with fluid growing klebsiella oxytoca and serratia marcescens.  Will obtain MRSA screening.  Assessment/Plan: Principal Problem:   Bacteremia Active Problems:   Chronic combined systolic and diastolic CHF (congestive heart failure) (HCC)   Abscess   Hypokalemia   IV drug user  Serratia bacteremia in setting of IV drug abuse -Echocardiogram revealed possible mural thrombus -TEE scheduled for Wednesday 9/1, negative for vegetation Management per ID recs We appreciate ID's assistance  Left upper extremity klebsiella oxytoca and serratia marcescens abscess post I&D in the ED 11/24/19, new developing abscess in the same extremity -Will curbside with gen surgery for possible I&D IV Toradol 15mg  x 1 dose  Chronic combined systolic and diastolic heart failure -EF of 15-20%  -ICD  removed in April 2020 due to MSSA bacteremia. Deemed not to be a candidate for replacement due to continued IV drug use  IV drug abuse -Endorsed heroin use 2 days prior to admission -Continue clonidine withdrawal protocol. Continue Suboxone   GERD -Protonix   DVT prophylaxis:  enoxaparin (LOVENOX) injection 40 mg Start: 11/26/19 1000  Code Status: Full code Family Communication: Mother at bedside Disposition Plan:   Status is: Inpatient  Remains inpatient appropriate because:Ongoing diagnostic testing needed not appropriate for outpatient work up, IV treatments appropriate due to intensity of illness or inability to take PO and Inpatient level of care appropriate due to severity of illness   Dispo: The patient is from: Home  Anticipated d/c is to: Home  Anticipated d/c date is: 12/01/19  Patient currently is not medically stable to d/c. Remains on IV cefepime, new developing abscess, may need I&D.   Consultants:   ID  Procedures:   TEE 9/1     Objective: Vitals:   11/30/19 0937 11/30/19 1020 11/30/19 1211 11/30/19 1325  BP: (!) 102/58 106/64 (!) 97/59 94/61  Pulse: 71     Resp: 14 16 16 16   Temp:  98.4 F (36.9 C)    TempSrc:  Oral    SpO2: 100% 98%    Weight:      Height:        Intake/Output Summary (Last 24 hours) at 11/30/2019 1709 Last data filed at 11/30/2019 1000 Gross per 24 hour  Intake 472 ml  Output --  Net 472 ml   Filed Weights   11/26/19 1637 11/27/19 0433 11/28/19 0311  Weight: 74.8 kg 75.7 kg 74.5 kg  Exam:  . General: 44 y.o. year-old male well developed well nourished in no acute distress.  Alert and oriented x3. . Cardiovascular: Regular rate and rhythm with no rubs or gallops.  No thyromegaly or JVD noted.   Marland Kitchen Respiratory: Clear to auscultation with no wheezes or rales. Good inspiratory effort. . Abdomen: Soft nontender nondistended with normal bowel sounds x4  quadrants. . Musculoskeletal: No lower extremity edema. 2/4 pulses in all 4 extremities. Marland Kitchen Psychiatry: Mood is appropriate for condition and setting   Data Reviewed: CBC: Recent Labs  Lab 11/25/19 2003 11/25/19 2003 11/26/19 8588 11/27/19 0327 11/28/19 0232 11/29/19 0334 11/30/19 0444  WBC 17.5*   < > 17.0* 13.6* 10.9* 12.7* 11.1*  NEUTROABS 14.7*  --   --  8.1*  --   --   --   HGB 14.8   < > 13.9 12.1* 13.9 12.0* 11.1*  HCT 46.2   < > 42.5 38.1* 44.8 39.3 36.2*  MCV 82.9   < > 82.8 83.0 86.3 86.8 86.4  PLT 354   < > 421* 377 290 315 274   < > = values in this interval not displayed.   Basic Metabolic Panel: Recent Labs  Lab 11/26/19 0539 11/27/19 0327 11/28/19 0232 11/29/19 0334 11/30/19 0444  NA 136 135 136 136 136  K 3.4* 4.2 4.7 4.3 4.1  CL 102 104 101 100 98  CO2 20* 23 25 27 30   GLUCOSE 104* 93 93 88 96  BUN 15 14 12 11 13   CREATININE 0.84 0.98 0.85 0.95 0.91  CALCIUM 9.1 8.8* 9.3 8.9 9.0  MG  --  1.6*  --  1.8  --   PHOS  --  2.5  --   --   --    GFR: Estimated Creatinine Clearance: 106.9 mL/min (by C-G formula based on SCr of 0.91 mg/dL). Liver Function Tests: Recent Labs  Lab 11/25/19 2003 11/27/19 0327  AST 20 18  ALT 15 12  ALKPHOS 257* 152*  BILITOT 0.8 0.6  PROT 9.2* 6.8  ALBUMIN 2.9* 2.2*   No results for input(s): LIPASE, AMYLASE in the last 168 hours. No results for input(s): AMMONIA in the last 168 hours. Coagulation Profile: No results for input(s): INR, PROTIME in the last 168 hours. Cardiac Enzymes: No results for input(s): CKTOTAL, CKMB, CKMBINDEX, TROPONINI in the last 168 hours. BNP (last 3 results) No results for input(s): PROBNP in the last 8760 hours. HbA1C: No results for input(s): HGBA1C in the last 72 hours. CBG: No results for input(s): GLUCAP in the last 168 hours. Lipid Profile: No results for input(s): CHOL, HDL, LDLCALC, TRIG, CHOLHDL, LDLDIRECT in the last 72 hours. Thyroid Function Tests: No results for  input(s): TSH, T4TOTAL, FREET4, T3FREE, THYROIDAB in the last 72 hours. Anemia Panel: No results for input(s): VITAMINB12, FOLATE, FERRITIN, TIBC, IRON, RETICCTPCT in the last 72 hours. Urine analysis:    Component Value Date/Time   COLORURINE YELLOW 05/21/2016 1853   APPEARANCEUR CLEAR 05/21/2016 1853   LABSPEC 1.026 05/21/2016 1853   PHURINE 5.0 05/21/2016 1853   GLUCOSEU NEGATIVE 05/21/2016 1853   HGBUR NEGATIVE 05/21/2016 1853   BILIRUBINUR NEGATIVE 05/21/2016 1853   KETONESUR NEGATIVE 05/21/2016 1853   PROTEINUR NEGATIVE 05/21/2016 1853   UROBILINOGEN 0.2 04/09/2009 1132   NITRITE NEGATIVE 05/21/2016 1853   LEUKOCYTESUR NEGATIVE 05/21/2016 1853   Sepsis Labs: @LABRCNTIP (procalcitonin:4,lacticidven:4)  ) Recent Results (from the past 240 hour(s))  Blood culture (routine x 2)     Status: Abnormal  Collection Time: 11/24/19 11:05 AM   Specimen: BLOOD  Result Value Ref Range Status   Specimen Description   Final    BLOOD RIGHT ANTECUBITAL Performed at [facility], [address] [place], [place] [address]    Special Requests   Final    BOTTLES DRAWN AEROBIC AND ANAEROBIC Blood Culture adequate volume Performed at [facility], [address] [place], [place] [address]    Culture  Setup Time   Final    GRAM NEGATIVE RODS IN BOTH AEROBIC AND ANAEROBIC BOTTLES Organism ID to follow CRITICAL RESULT CALLED TO, READ BACK BY AND VERIFIED WITH: Rosemarie Ax RN 10:25 11/25/19 ([email or web address]) Performed at [facility], [address], [place], [place] [address]    Culture SERRATIA MARCESCENS (A)  Final   Report Status 11/27/2019 FINAL  Final   Organism ID, Bacteria SERRATIA MARCESCENS  Final      Susceptibility   Serratia marcescens - MIC*    CEFAZOLIN >=64 RESISTANT Resistant     CEFEPIME <=0.12 SENSITIVE Sensitive     CEFTAZIDIME <=1 SENSITIVE Sensitive     CEFTRIAXONE <=0.25 SENSITIVE Sensitive     CIPROFLOXACIN <=0.25 SENSITIVE Sensitive      GENTAMICIN <=1 SENSITIVE Sensitive     TRIMETH/SULFA <=20 SENSITIVE Sensitive     * SERRATIA MARCESCENS  Blood Culture ID Panel (Reflexed)     Status: Abnormal   Collection Time: 11/24/19 11:05 AM  Result Value Ref Range Status   Enterococcus faecalis NOT DETECTED NOT DETECTED Final   Enterococcus Faecium NOT DETECTED NOT DETECTED Final   Listeria monocytogenes NOT DETECTED NOT DETECTED Final   Staphylococcus species NOT DETECTED NOT DETECTED Final   Staphylococcus aureus (BCID) NOT DETECTED NOT DETECTED Final   Staphylococcus epidermidis NOT DETECTED NOT DETECTED Final   Staphylococcus lugdunensis NOT DETECTED NOT DETECTED Final   Streptococcus species NOT DETECTED NOT DETECTED Final   Streptococcus agalactiae NOT DETECTED NOT DETECTED Final   Streptococcus pneumoniae NOT DETECTED NOT DETECTED Final   Streptococcus pyogenes NOT DETECTED NOT DETECTED Final   A.calcoaceticus-baumannii NOT DETECTED NOT DETECTED Final   Bacteroides fragilis NOT DETECTED NOT DETECTED Final   Enterobacterales DETECTED (A) NOT DETECTED Final    Comment: Enterobacterales represent a large order of gram negative bacteria, not a single organism. CRITICAL RESULT CALLED TO, READ BACK BY AND VERIFIED WITH: Rosemarie Ax RN 10:25 11/25/19 ([email or web address])    Enterobacter cloacae complex NOT DETECTED NOT DETECTED Final   Escherichia coli NOT DETECTED NOT DETECTED Final   Klebsiella aerogenes NOT DETECTED NOT DETECTED Final   Klebsiella oxytoca NOT DETECTED NOT DETECTED Final   Klebsiella pneumoniae NOT DETECTED NOT DETECTED Final   Proteus species NOT DETECTED NOT DETECTED Final   Salmonella species NOT DETECTED NOT DETECTED Final   Serratia marcescens DETECTED (A) NOT DETECTED Final    Comment: CRITICAL RESULT CALLED TO, READ BACK BY AND VERIFIED WITH: Rosemarie Ax RN 10:25 11/25/19 ([email or web address])    Haemophilus influenzae NOT DETECTED NOT DETECTED Final   Neisseria meningitidis NOT DETECTED NOT DETECTED Final   Pseudomonas  aeruginosa NOT DETECTED NOT DETECTED Final   Stenotrophomonas maltophilia NOT DETECTED NOT DETECTED Final   Candida albicans NOT DETECTED NOT DETECTED Final   Candida auris NOT DETECTED NOT DETECTED Final   Candida glabrata NOT DETECTED NOT DETECTED Final   Candida krusei NOT DETECTED NOT DETECTED Final   Candida parapsilosis NOT DETECTED NOT DETECTED Final   Candida tropicalis NOT DETECTED NOT DETECTED  Final   Cryptococcus neoformans/gattii NOT DETECTED NOT DETECTED Final   CTX-M ESBL NOT DETECTED NOT DETECTED Final   Carbapenem resistance IMP NOT DETECTED NOT DETECTED Final   Carbapenem resistance KPC NOT DETECTED NOT DETECTED Final   Carbapenem resistance NDM NOT DETECTED NOT DETECTED Final   Carbapenem resist OXA 48 LIKE NOT DETECTED NOT DETECTED Final   Carbapenem resistance VIM NOT DETECTED NOT DETECTED Final    Comment: Performed at [facility], [place] [address] [place], [place] [address]  Wound or Superficial Culture     Status: None   Collection Time: 11/24/19 11:57 AM   Specimen: Abscess; Wound  Result Value Ref Range Status   Specimen Description   Final    ABSCESS Performed at [facility], [address] [place], [place] [address]    Special Requests   Final    NONE Performed at [facility] [address] [place], [place] [address]    Gram Stain   Final    ABUNDANT WBC PRESENT,BOTH PMN AND MONONUCLEAR MODERATE GRAM POSITIVE COCCI FEW GRAM NEGATIVE RODS Performed at [facility], [place] [address], [place], [place] [address]    Culture FEW KLEBSIELLA OXYTOCA FEW SERRATIA MARCESCENS   Final   Report Status 11/27/2019 FINAL  Final   Organism ID, Bacteria KLEBSIELLA OXYTOCA  Final   Organism ID, Bacteria SERRATIA MARCESCENS  Final      Susceptibility   Klebsiella oxytoca - MIC*    AMPICILLIN RESISTANT Resistant     CEFAZOLIN <=4 SENSITIVE Sensitive     CEFEPIME <=0.12 SENSITIVE Sensitive     CEFTAZIDIME <=1 SENSITIVE Sensitive      CEFTRIAXONE <=0.25 SENSITIVE Sensitive     CIPROFLOXACIN <=0.25 SENSITIVE Sensitive     GENTAMICIN <=1 SENSITIVE Sensitive     IMIPENEM <=0.25 SENSITIVE Sensitive     TRIMETH/SULFA <=20 SENSITIVE Sensitive     AMPICILLIN/SULBACTAM 8 SENSITIVE Sensitive     PIP/TAZO <=4 SENSITIVE Sensitive     * FEW KLEBSIELLA OXYTOCA   Serratia marcescens - MIC*    CEFAZOLIN >=64 RESISTANT Resistant     CEFEPIME <=0.12 SENSITIVE Sensitive     CEFTAZIDIME <=1 SENSITIVE Sensitive     CEFTRIAXONE <=0.25 SENSITIVE Sensitive     CIPROFLOXACIN <=0.25 SENSITIVE Sensitive     GENTAMICIN <=1 SENSITIVE Sensitive     TRIMETH/SULFA <=20 SENSITIVE Sensitive     * FEW SERRATIA MARCESCENS  Blood culture (routine x 2)     Status: None   Collection Time: 11/25/19  9:23 PM   Specimen: BLOOD LEFT HAND  Result Value Ref Range Status   Specimen Description BLOOD LEFT HAND  Final   Special Requests   Final    BOTTLES DRAWN AEROBIC AND ANAEROBIC Blood Culture adequate volume   Culture   Final    NO GROWTH 5 DAYS Performed at [facility], [address], [place], [place] [address]    Report Status 11/30/2019 FINAL  Final  Culture, blood (single)     Status: Abnormal   Collection Time: 11/25/19  9:53 PM   Specimen: BLOOD  Result Value Ref Range Status   Specimen Description BLOOD SITE NOT SPECIFIED  Final   Special Requests   Final    BOTTLES DRAWN AEROBIC AND ANAEROBIC Blood Culture adequate volume   Culture  Setup Time   Final    GRAM NEGATIVE RODS ANAEROBIC BOTTLE ONLY CRITICAL VALUE NOTED.  VALUE IS CONSISTENT WITH PREVIOUSLY  REPORTED AND CALLED VALUE.    Culture (A)  Final    SERRATIA MARCESCENS SUSCEPTIBILITIES PERFORMED ON PREVIOUS CULTURE WITHIN THE LAST 5 DAYS. Performed at [facility], [place] [address] [place], [place] [address]    Report Status 11/29/2019 FINAL  Final  SARS Coronavirus 2 by RT PCR (hospital order, performed in [facility] hospital lab) Nasopharyngeal Nasopharyngeal  Swab     Status: None   Collection Time: 11/26/19 12:06 AM   Specimen: Nasopharyngeal Swab  Result Value Ref Range Status   SARS Coronavirus 2 NEGATIVE NEGATIVE Final    Comment: (NOTE) SARS-CoV-2 target nucleic acids are NOT DETECTED.  The SARS-CoV-2 RNA is generally detectable in upper and lower respiratory specimens during the acute phase of infection. The lowest concentration of SARS-CoV-2 viral copies this assay can detect is 250 copies / mL. A negative result does not preclude SARS-CoV-2 infection and should not be used as the sole basis for treatment or other patient management decisions.  A negative result may occur with improper specimen collection / handling, submission of specimen other than nasopharyngeal swab, presence of viral mutation(s) within the areas targeted by this assay, and inadequate number of viral copies (<250 copies / mL). A negative result must be combined with clinical observations, patient history, and epidemiological information.  Fact Sheet for Patients:   StrictlyIdeas.no  Fact Sheet for Healthcare Providers: BankingDealers.co.za  This test is not yet approved or  cleared by the [place] FDA and has been authorized for detection and/or diagnosis of SARS-CoV-2 by FDA under an Emergency Use Authorization (EUA).  This EUA will remain in effect (meaning this test can be used) for the duration of the COVID-19 declaration under Section 564(b)(1) of the Act, 21 U.S.C. section 360bbb-3(b)(1), unless the authorization is terminated or revoked sooner.  Performed at [facility], [place] [address], [place], [place] [address]   Blood culture (routine x 2)     Status: None (Preliminary result)   Collection Time: 11/26/19  5:39 AM   Specimen: BLOOD  Result Value Ref Range Status   Specimen Description BLOOD SITE NOT SPECIFIED  Final   Special Requests   Final    BOTTLES DRAWN AEROBIC AND ANAEROBIC  Blood Culture adequate volume   Culture   Final    NO GROWTH 4 DAYS Performed at [facility], [address], [place], [place] [address]    Report Status PENDING  Incomplete      Studies: ECHO TEE  Result Date: 11/30/2019    TRANSESOPHOGEAL ECHO REPORT   Patient Name:   John Davenport Date of Exam: 11/30/2019 Medical Rec #:  [id number]            Height:       75.0 in Accession #:    [id number]           Weight:       164.2 lb Date of Birth:  1976/03/06             BSA:          2.018 m Patient Age:    61 years             BP:           102/76 mmHg Patient Gender: M                    HR:           88 bpm. Exam Location:  Inpatient Procedure: Transesophageal Echo, Color Doppler, Cardiac Doppler, Saline Contrast            Bubble Study and Intracardiac Opacification Agent Indications:     Bacteremia  History:         Patient has prior history of Echocardiogram examinations, most                  recent 11/26/2019. CHF, CAD; Risk Factors:Dyslipidemia, Sleep                  Apnea and IVDU.  Sonographer:     Raquel Sarna Senior RDCS Sonographer#2:   Bernadene Person RDCS Referring Phys:  [id number] Abigail Butts Diagnosing Phys: Cherlynn Kaiser MD PROCEDURE: After discussion of the risks and benefits of a TEE, an informed consent was obtained from the patient. TEE procedure time was 20 minutes. The transesophogeal probe was passed without difficulty through the esophogus of the patient. Imaged were obtained with the patient in a left lateral decubitus position. Local oropharyngeal anesthetic was provided with Cetacaine. Sedation performed by different physician. The patient was monitored while under deep sedation. Anesthestetic sedation was provided intravenously by Anesthesiology: 157mg  of Propofol. Image quality was good. The patient's vital signs; including heart rate, blood pressure, and oxygen saturation; remained stable throughout the procedure. Supplementary images were obtained from  transthoracic  windows as indicated to answer the clinical question. The patient developed no complications during the procedure. IMPRESSIONS  1. Left ventricular ejection fraction, by estimation, is 15%. The left ventricle has severely decreased function. The left ventricle demonstrates global hypokinesis. The left ventricular internal cavity size was severely dilated.  2. Transthoracic images obtained to further evaluate LV apex. No definite mural thrombus. There is evidence of swirling contrast, and possible calcified trabeculations.  3. Right ventricular systolic function is mildly reduced. The right ventricular size is normal.  4. Left atrial size was mildly dilated. No left atrial/left atrial appendage thrombus was detected. The LAA emptying velocity was 43 cm/s.  5. The mitral valve is grossly normal. 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 Multiple small jets, one main central jet of mitral valve regurgitation.  6. The tricuspid valve is degenerative. Tricuspid valve regurgitation is mild to moderate.  7. The aortic valve is normal in structure. Aortic valve regurgitation is trivial. No aortic stenosis is present.  8. Agitated saline contrast bubble study was positive with shunting observed after 12 cardiac cycles suggestive of intrapulmonary shunting.  9. Normal size ascending aorta. No aortic atherosclerosis in the visualized thoracic aorta. Conclusion(s)/Recommendation(s): No evidence of vegetation/infective endocarditis on this transesophageal echocardiogram. FINDINGS  Left Ventricle: Left ventricular ejection fraction, by estimation, is 15%. The left ventricle has severely decreased function. The left ventricle demonstrates global hypokinesis. Definity contrast agent was given IV to delineate the left ventricular endocardial borders. The left ventricular internal cavity size was severely dilated. There is no left ventricular hypertrophy. Right Ventricle: The right ventricular size is  normal. No increase in right ventricular wall thickness. Right ventricular systolic function is mildly reduced. Left Atrium: Left atrial size was mildly dilated. No left atrial/left atrial appendage thrombus was detected. The LAA emptying velocity was 43 cm/s. Right Atrium: Right atrial size was normal in size. Pericardium: There is no evidence of pericardial effusion. Mitral Valve: The mitral valve is grossly normal. 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 Multiple small jets, one main central jet of mitral valve regurgitation. Tricuspid Valve: The tricuspid valve is degenerative in appearance.  Tricuspid valve regurgitation is mild to moderate. Aortic Valve: The aortic valve is normal in structure. Aortic valve regurgitation is trivial. No aortic stenosis is present. Pulmonic Valve: The pulmonic valve was normal in structure. Pulmonic valve regurgitation is trivial. Aorta: Normal size ascending aorta. No aortic atherosclerosis in the visualized thoracic aorta. The aortic root and ascending aorta are structurally normal, with no evidence of dilitation. IAS/Shunts: No atrial level shunt detected by color flow Doppler. Agitated saline contrast was given intravenously to evaluate for intracardiac shunting. Agitated saline contrast bubble study was positive with shunting observed after >6 cardiac cycles suggestive of intrapulmonary shunting.  LEFT VENTRICLE PLAX 2D LVOT diam:     2.40 cm LVOT Area:     4.52 cm  MR Peak grad:    81.4 mmHg MR Mean grad:    55.0 mmHg   SHUNTS MR Vmax:         451.00 cm/s Systemic Diam: 2.40 cm MR Vmean:        350.0 cm/s MR PISA:         2.26 cm MR PISA Eff ROA: 23 mm MR PISA Radius:  0.60 cm Cherlynn Kaiser MD Electronically signed by Cherlynn Kaiser MD Signature Date/Time: 11/30/2019/10:21:39 AM    Final     Scheduled Meds: . buprenorphine-naloxone  1 tablet Sublingual BID  . cloNIDine  0.1 mg Oral QAC breakfast  . enoxaparin (LOVENOX) injection  40 mg  Subcutaneous Daily  . ketorolac  15 mg Intravenous Once  . nicotine  14 mg Transdermal Daily  . pantoprazole  40 mg Oral BID AC    Continuous Infusions: . ceFEPime (MAXIPIME) IV 2 g (11/30/19 1209)     LOS: 4 days     Kayleen Memos, MD [facility] Pager [phone]  If 7PM-7AM, please contact night-coverage [email or web address] Password [email or web address] 11/30/2019, 5:09 PM

## 2019-11-30 NOTE — Progress Notes (Signed)
Echocardiogram Echocardiogram Transesophageal has been performed.  Oneal Deputy Jamus Loving 11/30/2019, 9:36 AM

## 2019-11-30 NOTE — Transfer of Care (Signed)
Immediate Anesthesia Transfer of Care Note  Patient: John Davenport  Procedure(s) Performed: TRANSESOPHAGEAL ECHOCARDIOGRAM (TEE) (N/A ) BUBBLE STUDY  Patient Location: Endoscopy Unit  Anesthesia Type:MAC  Level of Consciousness: drowsy  Airway & Oxygen Therapy: Patient Spontanous Breathing and Patient connected to nasal cannula oxygen  Post-op Assessment: Report given to RN and vital signs stable  Post vital signs: Reviewed and stable  Last Vitals:  Vitals Value Taken Time  BP 103/54 11/30/19 0918  Temp    Pulse 72 11/30/19 0917  Resp 15 11/30/19 0918  SpO2 98 % 11/30/19 0917  Vitals shown include unvalidated device data.  Last Pain:  Vitals:   11/30/19 0742  TempSrc: Oral  PainSc: 10-Worst pain ever      Patients Stated Pain Goal: 0 (07/86/75 4492)  Complications: No complications documented.

## 2019-12-01 LAB — CULTURE, BLOOD (ROUTINE X 2)
Culture: NO GROWTH
Special Requests: ADEQUATE

## 2019-12-01 MED ORDER — KETOROLAC TROMETHAMINE 15 MG/ML IJ SOLN
15.0000 mg | Freq: Four times a day (QID) | INTRAMUSCULAR | Status: DC | PRN
  Administered 2019-12-01 – 2019-12-02 (×3): 15 mg via INTRAVENOUS
  Filled 2019-12-01 (×3): qty 1

## 2019-12-01 MED ORDER — LIDOCAINE-EPINEPHRINE 1 %-1:100000 IJ SOLN
20.0000 mL | Freq: Once | INTRAMUSCULAR | Status: DC
  Filled 2019-12-01: qty 20

## 2019-12-01 NOTE — Consult Note (Signed)
Reason for Consult:Left FA abscess Referring Physician: [name] is an 44 y.o. male.  HPI: John Davenport was admitted 5d ago with CP 2/2 bacteremia. He had a proximal left FA abscess at that time that was I&D'd in the ED. He had a very small distal FA abscess that got suddenly worse over the last 48h. He denies fevers, chills, N/V. He does endorse sweats but thinks it's from pain. He denies N/T. He is RHD.   Past Medical History:  Diagnosis Date  . Anxiety state, unspecified   . Back pain   . Cancer ([place])    Lung CA  . Chronic systolic heart failure ([place])    a. Ischemic CM (EF 30% by echo in 2011) b. echo 08/2015: EF 20% with severe HK and AK of the inferoseptal and apical myocardium.  . Coronary artery disease    a. 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  b. eventually required single vessel CABG (L-LAD) in 03/2009 due to restenosis. John. cath 2013 showed patent LIMA sequential to the diags with LAD filling retrograde, no new disease in RCA and Cx, low LVEDP, sx felt noncardiac. d. 08/2015: NSTEMI: CTO of prox LAD, 99% stenosis of small dCx (no stent), + for cocaine  . Esophageal reflux   . Heart attack ([place])   . 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 Hypercholesteremia   . 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 [place] Sci ICD 12/2008.   . Ischemic cardiomyopathy    echocardiogram 12/11: Mild LVH, EF 30-35%, anteroseptal and apical akinesis, grade 1 diastolic dysfunction  . LBP (low back pain)   . OSA (obstructive sleep apnea) 12/30/2010  . Paroxysmal ventricular tachycardia ([place])   . Stroke ([place])   . Tobacco abuse     Past Surgical History:  Procedure Laterality Date  . CARDIAC CATHETERIZATION N/A 09/06/2015   Procedure: Left Heart Cath and Cors/Grafts Angiography;  Surgeon: Sherren Mocha, MD;  Location: [facility];   Service: Cardiovascular;  Laterality: N/A;  . CARDIAC DEFIBRILLATOR PLACEMENT    . CARDIAC DEFIBRILLATOR PLACEMENT     2010 by JA  . CORONARY ANGIOPLASTY WITH STENT PLACEMENT     LAD stenting 10/09 and re-opening 6/10  . CORONARY ARTERY BYPASS GRAFT     1/11  . ICD LEAD REMOVAL Left 07/16/2018   Procedure: ICD LEAD REMOVAL;  Surgeon: Evans Lance, MD;  Location: [facility];  Service: Cardiovascular;  Laterality: Left;  [place] back up  . IR GENERIC HISTORICAL  05/21/2016   IR PERCUTANEOUS ART THROMBECTOMY/INFUSION INTRACRANIAL INC DIAG ANGIO 05/21/2016 Luanne Bras, MD [facility]  . IR GENERIC HISTORICAL  06/16/2016   IR RADIOLOGIST EVAL & MGMT 06/16/2016 [facility]  . LEFT HEART CATHETERIZATION WITH CORONARY ANGIOGRAM N/A 08/07/2011   Procedure: LEFT HEART CATHETERIZATION WITH CORONARY ANGIOGRAM;  Surgeon: Josue Hector, MD;  Location: [facility];  Service: Cardiovascular;  Laterality: N/A;  . RADIOLOGY WITH ANESTHESIA N/A 05/21/2016   Procedure: RADIOLOGY WITH ANESTHESIA;  Surgeon: Luanne Bras, MD;  Location: [facility];  Service: Radiology;  Laterality: N/A;  . RIGHT/LEFT HEART CATH AND CORONARY ANGIOGRAPHY N/A 02/03/2018   Procedure: RIGHT/LEFT HEART CATH AND CORONARY ANGIOGRAPHY;  Surgeon: Larey Dresser, MD;  Location: [facility];  Service: Cardiovascular;  Laterality: N/A;  . TEE WITHOUT CARDIOVERSION N/A 07/16/2018  Procedure: TRANSESOPHAGEAL ECHOCARDIOGRAM (TEE);  Surgeon: Evans Lance, MD;  Location: [facility] OR;  Service: Cardiovascular;  Laterality: N/A;    Family History  Problem Relation Age of Onset  . Heart attack Father 2       died at 64 after multiple MI's  . COPD Maternal Grandmother     Social History:  reports that he has been smoking cigarettes. He has a 10.00 pack-year smoking history. He has quit using smokeless tobacco.  His smokeless tobacco use included chew. He reports current drug use. Drug: Cocaine. He reports that he does not drink  alcohol.  Allergies: No Known Allergies  Medications: I have reviewed the patient's current medications.  Results for orders placed or performed during the hospital encounter of 11/25/19 (from the past 48 hour(s))  Basic metabolic panel     Status: None   Collection Time: 11/30/19  4:44 AM  Result Value Ref Range   Sodium 136 135 - 145 mmol/L   Potassium 4.1 3.5 - 5.1 mmol/L   Chloride 98 98 - 111 mmol/L   CO2 30 22 - 32 mmol/L   Glucose, Bld 96 70 - 99 mg/dL    Comment: Glucose reference range applies only to samples taken after fasting for at least 8 hours.   BUN 13 6 - 20 mg/dL   Creatinine, Ser 0.91 0.61 - 1.24 mg/dL   Calcium 9.0 8.9 - 10.3 mg/dL   GFR calc non Af Amer >60 >60 mL/min   GFR calc Af Amer >60 >60 mL/min   Anion gap 8 5 - 15    Comment: Performed at [facility] [address] [place], [place] [address]  CBC     Status: Abnormal   Collection Time: 11/30/19  4:44 AM  Result Value Ref Range   WBC 11.1 (H) 4.0 - 10.5 K/uL   RBC 4.19 (L) 4.22 - 5.81 MIL/uL   Hemoglobin 11.1 (L) 13.0 - 17.0 g/dL   HCT 36.2 (L) 39 - 52 %   MCV 86.4 80.0 - 100.0 fL   MCH 26.5 26.0 - 34.0 pg   MCHC 30.7 30.0 - 36.0 g/dL   RDW 15.8 (H) 11.5 - 15.5 %   Platelets 274 150 - 400 K/uL   nRBC 0.0 0.0 - 0.2 %    Comment: Performed at [facility], [place] [address] [place], [place] [address]  MRSA PCR Screening     Status: None   Collection Time: 11/30/19  5:22 PM   Specimen: Nasopharyngeal  Result Value Ref Range   MRSA by PCR NEGATIVE NEGATIVE    Comment:        The GeneXpert MRSA Assay (FDA approved for NASAL specimens only), is one component of a comprehensive MRSA colonization surveillance program. It is not intended to diagnose MRSA infection nor to guide or monitor treatment for MRSA infections. Performed at [facility], [place] [address] [place], [place] [address]     ECHO TEE  Result Date: 11/30/2019    TRANSESOPHOGEAL ECHO REPORT   Patient Name:    John Davenport Date of Exam: 11/30/2019 Medical Rec #:  [id number]            Height:       75.0 in Accession #:    [id number]           Weight:       164.2 lb Date of Birth:  Dec 19, 1975  BSA:          2.018 m Patient Age:    42 years             BP:           102/76 mmHg Patient Gender: M                    HR:           88 bpm. Exam Location:  Inpatient Procedure: Transesophageal Echo, Color Doppler, Cardiac Doppler, Saline Contrast            Bubble Study and Intracardiac Opacification Agent Indications:     Bacteremia  History:         Patient has prior history of Echocardiogram examinations, most                  recent 11/26/2019. CHF, CAD; Risk Factors:Dyslipidemia, Sleep                  Apnea and IVDU.  Sonographer:     Raquel Sarna Senior RDCS Sonographer#2:   Bernadene Person RDCS Referring Phys:  [id number] Abigail Butts Diagnosing Phys: Cherlynn Kaiser MD PROCEDURE: After discussion of the risks and benefits of a TEE, an informed consent was obtained from the patient. TEE procedure time was 20 minutes. The transesophogeal probe was passed without difficulty through the esophogus of the patient. Imaged were obtained with the patient in a left lateral decubitus position. Local oropharyngeal anesthetic was provided with Cetacaine. Sedation performed by different physician. The patient was monitored while under deep sedation. Anesthestetic sedation was provided intravenously by Anesthesiology: 157mg  of Propofol. Image quality was good. The patient's vital signs; including heart rate, blood pressure, and oxygen saturation; remained stable throughout the procedure. Supplementary images were obtained from  transthoracic windows as indicated to answer the clinical question. The patient developed no complications during the procedure. IMPRESSIONS  1. Left ventricular ejection fraction, by estimation, is 15%. The left ventricle has severely decreased function. The left ventricle demonstrates global  hypokinesis. The left ventricular internal cavity size was severely dilated.  2. Transthoracic images obtained to further evaluate LV apex. No definite mural thrombus. There is evidence of swirling contrast, and possible calcified trabeculations.  3. Right ventricular systolic function is mildly reduced. The right ventricular size is normal.  4. Left atrial size was mildly dilated. No left atrial/left atrial appendage thrombus was detected. The LAA emptying velocity was 43 cm/s.  5. The mitral valve is grossly normal. 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 Multiple small jets, one main central jet of mitral valve regurgitation.  6. The tricuspid valve is degenerative. Tricuspid valve regurgitation is mild to moderate.  7. The aortic valve is normal in structure. Aortic valve regurgitation is trivial. No aortic stenosis is present.  8. Agitated saline contrast bubble study was positive with shunting observed after 12 cardiac cycles suggestive of intrapulmonary shunting.  9. Normal size ascending aorta. No aortic atherosclerosis in the visualized thoracic aorta. Conclusion(s)/Recommendation(s): No evidence of vegetation/infective endocarditis on this transesophageal echocardiogram. FINDINGS  Left Ventricle: Left ventricular ejection fraction, by estimation, is 15%. The left ventricle has severely decreased function. The left ventricle demonstrates global hypokinesis. Definity contrast agent was given IV to delineate the left ventricular endocardial borders. The left ventricular internal cavity size was severely dilated. There is no left ventricular hypertrophy. Right Ventricle: The right ventricular size is normal. No increase in right ventricular wall thickness. Right ventricular systolic  function is mildly reduced. Left Atrium: Left atrial size was mildly dilated. No left atrial/left atrial appendage thrombus was detected. The LAA emptying velocity was 43 cm/s. Right Atrium: Right  atrial size was normal in size. Pericardium: There is no evidence of pericardial effusion. Mitral Valve: The mitral valve is grossly normal. 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 Multiple small jets, one main central jet of mitral valve regurgitation. Tricuspid Valve: The tricuspid valve is degenerative in appearance. Tricuspid valve regurgitation is mild to moderate. Aortic Valve: The aortic valve is normal in structure. Aortic valve regurgitation is trivial. No aortic stenosis is present. Pulmonic Valve: The pulmonic valve was normal in structure. Pulmonic valve regurgitation is trivial. Aorta: Normal size ascending aorta. No aortic atherosclerosis in the visualized thoracic aorta. The aortic root and ascending aorta are structurally normal, with no evidence of dilitation. IAS/Shunts: No atrial level shunt detected by color flow Doppler. Agitated saline contrast was given intravenously to evaluate for intracardiac shunting. Agitated saline contrast bubble study was positive with shunting observed after >6 cardiac cycles suggestive of intrapulmonary shunting.  LEFT VENTRICLE PLAX 2D LVOT diam:     2.40 cm LVOT Area:     4.52 cm  MR Peak grad:    81.4 mmHg MR Mean grad:    55.0 mmHg   SHUNTS MR Vmax:         451.00 cm/s Systemic Diam: 2.40 cm MR Vmean:        350.0 cm/s MR PISA:         2.26 cm MR PISA Eff ROA: 23 mm MR PISA Radius:  0.60 cm Cherlynn Kaiser MD Electronically signed by Cherlynn Kaiser MD Signature Date/Time: 11/30/2019/10:21:39 AM    Final     Review of Systems  Constitutional: Positive for diaphoresis. Negative for chills and fever.  HENT: Negative for ear discharge, ear pain, hearing loss and tinnitus.   Eyes: Negative for photophobia and pain.  Respiratory: Negative for cough and shortness of breath.   Cardiovascular: Negative for chest pain.  Gastrointestinal: Negative for abdominal pain, nausea and vomiting.  Genitourinary: Negative for dysuria, flank  pain, frequency and urgency.  Musculoskeletal: Positive for myalgias (Left forearm). Negative for back pain and neck pain.  Neurological: Negative for dizziness and headaches.  Hematological: Does not bruise/bleed easily.  Psychiatric/Behavioral: The patient is not nervous/anxious.    Blood pressure 108/67, pulse 69, temperature 98.1 F (36.7 John), temperature source Oral, resp. rate 18, height 6\' 3"  (1.905 m), weight 77.8 kg, SpO2 99 %. Physical Exam Constitutional:      General: He is not in acute distress.    Appearance: He is well-developed. He is not diaphoretic.  HENT:     Head: Normocephalic and atraumatic.  Eyes:     General: No scleral icterus.       Right eye: No discharge.        Left eye: No discharge.     Conjunctiva/sclera: Conjunctivae normal.  Cardiovascular:     Rate and Rhythm: Normal rate and regular rhythm.  Pulmonary:     Effort: Pulmonary effort is normal. No respiratory distress.  Musculoskeletal:     Cervical back: Normal range of motion.     Comments: Left shoulder, elbow, wrist, digits- fluctuant area dorsal distal FA, mod TTP, no pain with AROM wrist/elbow, no instability, no blocks to motion  Sens  Ax/R/M/U intact  Mot   Ax/ R/ PIN/ M/ AIN/ U intact  Rad 2+  Skin:  General: Skin is warm and dry.  Neurological:     Mental Status: He is alert.  Psychiatric:        Behavior: Behavior normal.     Assessment/Plan: Left FA abscess -- No s/sx of joint involvement. Will likely be amenable to bedside I&D. Will perform later today and send for culture. Multiple medical problems including IV drug use, paroxysmal ventricular tachycardia with history of ICD placement and removal in 06/2018 due to MSSA bacteremia with vegetation on device, CAD s/p P CABG x5, OSA not on CPAP, combined systolic and diastolic heart failure, CVA, CKD stage III, and hepatitis John -- per primary service    Lisette Abu, PA-[facility] [phone] 12/01/2019, 10:49 AM

## 2019-12-01 NOTE — Procedures (Signed)
Procedure: Left forearm I&D  Indication: Left forearm abscess  Surgeon: [name], PA-C  Assist: None  Anesthesia: 1% lidocaine w/epi (59ml)  EBL: None  Complications: None  Findings: After risks/benefits explained patient desires to undergo procedure. Consent obtained and time out performed. The left forearm was sterilely prepped and anesthetized. A 2cm incision was carried out and copious amounts of purulence were evacuated. Some of this was sent for culture. The wound was explored with a hemostat and digitally to break up any loculations. The wound was then irrigated with 1L NS. A corner of a 4x4 was used to pack the wound and it was then dressed. Pt tolerated the procedure well.    Lisette Abu, PA-[facility] Orthopedic Surgery [phone]

## 2019-12-01 NOTE — Progress Notes (Signed)
PROGRESS NOTE  John Davenport DUK:[id number] DOB: 1976/01/26 DOA: 11/25/2019 PCP: Patient, No Pcp Per  HPI/Recap of past 24 hours: John Davenport is a 44 y.o.malewith medical history significant forIV drug use, paroxysmal ventricular tachycardia with history of ICD placement and removal in 06/2018 due to MSSA bacteremia withvegetation on device, CAD s/p CABG x5,OSAnot on CPAP, chronic combined systolic and diastolic heart failure, CVA, CKD stage III, hepatitis C who presents with concerns of chest pain. Patient presented with similar symptoms of chest pain on 8/26 to the ED. He was noted to have a troponin of 11 with no ischemic changes seen on EKG. Had mild leukocytosis of 16.5K but was afebrile without tachycardia. He was also noted to have left upper extremity abscess that was drained in the ED. There was plan for admission for IV antibiotics but patient left AMA. Blood culture obtained at that time laterreturnedpositive for Serratia.  TRH asked to admit for bacteremia.   Reports new abscess in his left upper extremity at dorsal region.  Abscess at [facility] site is post I&D on 8/26 with fluid positive for klebsiella oxytoca and serratia marcescens.  MRSA screening negative.  12/01/19: Reports significant pain in his left upper extremity at the site of the new abscess.  Orthopedic surgery consulted for I&D.  Assessment/Plan: Principal Problem:   Bacteremia Active Problems:   Chronic combined systolic and diastolic CHF (congestive heart failure) (HCC)   Abscess   Hypokalemia   IV drug user  Serratia bacteremia in setting of IV drug abuse -Echocardiogram revealed possible mural thrombus -TEE scheduled 9/1, negative for vegetation Management per ID recs, signed off We appreciate ID's assistance  Left upper extremity klebsiella oxytoca and serratia marcescens abscess post I&D in the ED 11/24/19, new developing abscess in the same extremity now post I&D by ortho on  12/01/19 Abscess fluid sent for analysis, follow Continue antibiotics as recommended by infectious disease -Continue analgesic as needed Appreciate orthopedic surgery's assistance.  Chronic combined systolic and diastolic heart failure -EF of 15-20%  -ICD removed in April 2020 due to MSSA bacteremia. Deemed not to be a candidate for replacement due to continued IV drug use  IV drug abuse -Endorsed heroin use 2 days prior to admission -Continue clonidine withdrawal protocol. Continue Suboxone   GERD Stable -Protonix   DVT prophylaxis:  Lovenox subcu daily   Code Status: Full code Family Communication: Mother at bedside on 11/30/2019 Disposition Plan:   Status is: Inpatient  Remains inpatient appropriate because:Ongoing diagnostic testing needed not appropriate for outpatient work up, IV treatments appropriate due to intensity of illness or inability to take PO and Inpatient level of care appropriate due to severity of illness   Dispo: The patient is from: Home  Anticipated d/c is to: Home  Anticipated d/c date is: 12/02/19  Patient currently is not medically stable to d/c.   Ongoing management for left upper extremity abscess and bacteremia.  Consultants:   ID  Orthopedic surgery  Procedures:   TEE 9/1  I&D x2     Objective: Vitals:   11/30/19 1728 11/30/19 2025 12/01/19 0430 12/01/19 1353  BP: (!) 125/105 (!) 97/59 108/67 (!) 103/59  Pulse:  74 69 73  Resp: 18 18 18    Temp: 98.5 F (36.9 C) 98.8 F (37.1 C) 98.1 F (36.7 C) 97.7 F (36.5 C)  TempSrc: Oral Oral Oral Oral  SpO2: 98% 99% 99% 99%  Weight:   77.8 kg   Height:  Intake/Output Summary (Last 24 hours) at 12/01/2019 1556 Last data filed at 12/01/2019 1500 Gross per 24 hour  Intake 562 ml  Output --  Net 562 ml   Filed Weights   11/27/19 0433 11/28/19 0311 12/01/19 0430  Weight: 75.7 kg 74.5 kg 77.8 kg    Exam:  . General: 44 y.o.  year-old male well-developed well-nourished no distress.  Alert and oriented x3.   . Cardiovascular: Regular rate and rhythm no rubs or gallops.   Marland Kitchen Respiratory: Clear to auscultation no wheezes or rales.   . Abdomen: Soft nontender normal bowel sounds present.   . Musculoskeletal: No lower extremity edema bilaterally.   Marland Kitchen Psychiatry: Mood is appropriate for condition and setting.  Data Reviewed: CBC: Recent Labs  Lab 11/25/19 2003 11/25/19 2003 11/26/19 0737 11/27/19 0327 11/28/19 0232 11/29/19 0334 11/30/19 0444  WBC 17.5*   < > 17.0* 13.6* 10.9* 12.7* 11.1*  NEUTROABS 14.7*  --   --  8.1*  --   --   --   HGB 14.8   < > 13.9 12.1* 13.9 12.0* 11.1*  HCT 46.2   < > 42.5 38.1* 44.8 39.3 36.2*  MCV 82.9   < > 82.8 83.0 86.3 86.8 86.4  PLT 354   < > 421* 377 290 315 274   < > = values in this interval not displayed.   Basic Metabolic Panel: Recent Labs  Lab 11/26/19 0539 11/27/19 0327 11/28/19 0232 11/29/19 0334 11/30/19 0444  NA 136 135 136 136 136  K 3.4* 4.2 4.7 4.3 4.1  CL 102 104 101 100 98  CO2 20* 23 25 27 30   GLUCOSE 104* 93 93 88 96  BUN 15 14 12 11 13   CREATININE 0.84 0.98 0.85 0.95 0.91  CALCIUM 9.1 8.8* 9.3 8.9 9.0  MG  --  1.6*  --  1.8  --   PHOS  --  2.5  --   --   --    GFR: Estimated Creatinine Clearance: 111.6 mL/min (by C-G formula based on SCr of 0.91 mg/dL). Liver Function Tests: Recent Labs  Lab 11/25/19 2003 11/27/19 0327  AST 20 18  ALT 15 12  ALKPHOS 257* 152*  BILITOT 0.8 0.6  PROT 9.2* 6.8  ALBUMIN 2.9* 2.2*   No results for input(s): LIPASE, AMYLASE in the last 168 hours. No results for input(s): AMMONIA in the last 168 hours. Coagulation Profile: No results for input(s): INR, PROTIME in the last 168 hours. Cardiac Enzymes: No results for input(s): CKTOTAL, CKMB, CKMBINDEX, TROPONINI in the last 168 hours. BNP (last 3 results) No results for input(s): PROBNP in the last 8760 hours. HbA1C: No results for input(s): HGBA1C in  the last 72 hours. CBG: No results for input(s): GLUCAP in the last 168 hours. Lipid Profile: No results for input(s): CHOL, HDL, LDLCALC, TRIG, CHOLHDL, LDLDIRECT in the last 72 hours. Thyroid Function Tests: No results for input(s): TSH, T4TOTAL, FREET4, T3FREE, THYROIDAB in the last 72 hours. Anemia Panel: No results for input(s): VITAMINB12, FOLATE, FERRITIN, TIBC, IRON, RETICCTPCT in the last 72 hours. Urine analysis:    Component Value Date/Time   COLORURINE YELLOW 05/21/2016 1853   APPEARANCEUR CLEAR 05/21/2016 1853   LABSPEC 1.026 05/21/2016 1853   PHURINE 5.0 05/21/2016 1853   GLUCOSEU NEGATIVE 05/21/2016 1853   HGBUR NEGATIVE 05/21/2016 1853   BILIRUBINUR NEGATIVE 05/21/2016 1853   KETONESUR NEGATIVE 05/21/2016 1853   PROTEINUR NEGATIVE 05/21/2016 1853   UROBILINOGEN 0.2 04/09/2009 1132   NITRITE  NEGATIVE 05/21/2016 1853   LEUKOCYTESUR NEGATIVE 05/21/2016 1853   Sepsis Labs: @LABRCNTIP (procalcitonin:4,lacticidven:4)  ) Recent Results (from the past 240 hour(s))  Blood culture (routine x 2)     Status: Abnormal   Collection Time: 11/24/19 11:05 AM   Specimen: BLOOD  Result Value Ref Range Status   Specimen Description   Final    BLOOD RIGHT ANTECUBITAL Performed at [facility], [address] [place], [place] [address]    Special Requests   Final    BOTTLES DRAWN AEROBIC AND ANAEROBIC Blood Culture adequate volume Performed at [facility], [address] [place], [place] [address]    Culture  Setup Time   Final    GRAM NEGATIVE RODS IN BOTH AEROBIC AND ANAEROBIC BOTTLES Organism ID to follow CRITICAL RESULT CALLED TO, READ BACK BY AND VERIFIED WITH: Rosemarie Ax RN 10:25 11/25/19 ([email or web address]) Performed at [facility], [place] [address], [place], [place] [address]    Culture SERRATIA MARCESCENS (A)  Final   Report Status 11/27/2019 FINAL  Final   Organism ID, Bacteria SERRATIA MARCESCENS  Final      Susceptibility   Serratia  marcescens - MIC*    CEFAZOLIN >=64 RESISTANT Resistant     CEFEPIME <=0.12 SENSITIVE Sensitive     CEFTAZIDIME <=1 SENSITIVE Sensitive     CEFTRIAXONE <=0.25 SENSITIVE Sensitive     CIPROFLOXACIN <=0.25 SENSITIVE Sensitive     GENTAMICIN <=1 SENSITIVE Sensitive     TRIMETH/SULFA <=20 SENSITIVE Sensitive     * SERRATIA MARCESCENS  Blood Culture ID Panel (Reflexed)     Status: Abnormal   Collection Time: 11/24/19 11:05 AM  Result Value Ref Range Status   Enterococcus faecalis NOT DETECTED NOT DETECTED Final   Enterococcus Faecium NOT DETECTED NOT DETECTED Final   Listeria monocytogenes NOT DETECTED NOT DETECTED Final   Staphylococcus species NOT DETECTED NOT DETECTED Final   Staphylococcus aureus (BCID) NOT DETECTED NOT DETECTED Final   Staphylococcus epidermidis NOT DETECTED NOT DETECTED Final   Staphylococcus lugdunensis NOT DETECTED NOT DETECTED Final   Streptococcus species NOT DETECTED NOT DETECTED Final   Streptococcus agalactiae NOT DETECTED NOT DETECTED Final   Streptococcus pneumoniae NOT DETECTED NOT DETECTED Final   Streptococcus pyogenes NOT DETECTED NOT DETECTED Final   A.calcoaceticus-baumannii NOT DETECTED NOT DETECTED Final   Bacteroides fragilis NOT DETECTED NOT DETECTED Final   Enterobacterales DETECTED (A) NOT DETECTED Final    Comment: Enterobacterales represent a large order of gram negative bacteria, not a single organism. CRITICAL RESULT CALLED TO, READ BACK BY AND VERIFIED WITH: Rosemarie Ax RN 10:25 11/25/19 ([email or web address])    Enterobacter cloacae complex NOT DETECTED NOT DETECTED Final   Escherichia coli NOT DETECTED NOT DETECTED Final   Klebsiella aerogenes NOT DETECTED NOT DETECTED Final   Klebsiella oxytoca NOT DETECTED NOT DETECTED Final   Klebsiella pneumoniae NOT DETECTED NOT DETECTED Final   Proteus species NOT DETECTED NOT DETECTED Final   Salmonella species NOT DETECTED NOT DETECTED Final   Serratia marcescens DETECTED (A) NOT DETECTED Final    Comment:  CRITICAL RESULT CALLED TO, READ BACK BY AND VERIFIED WITH: Rosemarie Ax RN 10:25 11/25/19 ([email or web address])    Haemophilus influenzae NOT DETECTED NOT DETECTED Final   Neisseria meningitidis NOT DETECTED NOT DETECTED Final   Pseudomonas aeruginosa NOT DETECTED NOT DETECTED Final   Stenotrophomonas maltophilia NOT DETECTED NOT DETECTED Final   Candida albicans NOT DETECTED NOT DETECTED Final   Candida auris NOT DETECTED NOT  DETECTED Final   Candida glabrata NOT DETECTED NOT DETECTED Final   Candida krusei NOT DETECTED NOT DETECTED Final   Candida parapsilosis NOT DETECTED NOT DETECTED Final   Candida tropicalis NOT DETECTED NOT DETECTED Final   Cryptococcus neoformans/gattii NOT DETECTED NOT DETECTED Final   CTX-M ESBL NOT DETECTED NOT DETECTED Final   Carbapenem resistance IMP NOT DETECTED NOT DETECTED Final   Carbapenem resistance KPC NOT DETECTED NOT DETECTED Final   Carbapenem resistance NDM NOT DETECTED NOT DETECTED Final   Carbapenem resist OXA 48 LIKE NOT DETECTED NOT DETECTED Final   Carbapenem resistance VIM NOT DETECTED NOT DETECTED Final    Comment: Performed at [facility], [place] [address] [place], [place] [address]  Wound or Superficial Culture     Status: None   Collection Time: 11/24/19 11:57 AM   Specimen: Abscess; Wound  Result Value Ref Range Status   Specimen Description   Final    ABSCESS Performed at [facility], [address] [place], [place] [address]    Special Requests   Final    NONE Performed at [facility], [address] [place], [place] [address]    Gram Stain   Final    ABUNDANT WBC PRESENT,BOTH PMN AND MONONUCLEAR MODERATE GRAM POSITIVE COCCI FEW GRAM NEGATIVE RODS Performed at [facility], [place] [address], [place], [place] [address]    Culture FEW KLEBSIELLA OXYTOCA FEW SERRATIA MARCESCENS   Final   Report Status 11/27/2019 FINAL  Final   Organism ID, Bacteria KLEBSIELLA OXYTOCA  Final   Organism ID, Bacteria  SERRATIA MARCESCENS  Final      Susceptibility   Klebsiella oxytoca - MIC*    AMPICILLIN RESISTANT Resistant     CEFAZOLIN <=4 SENSITIVE Sensitive     CEFEPIME <=0.12 SENSITIVE Sensitive     CEFTAZIDIME <=1 SENSITIVE Sensitive     CEFTRIAXONE <=0.25 SENSITIVE Sensitive     CIPROFLOXACIN <=0.25 SENSITIVE Sensitive     GENTAMICIN <=1 SENSITIVE Sensitive     IMIPENEM <=0.25 SENSITIVE Sensitive     TRIMETH/SULFA <=20 SENSITIVE Sensitive     AMPICILLIN/SULBACTAM 8 SENSITIVE Sensitive     PIP/TAZO <=4 SENSITIVE Sensitive     * FEW KLEBSIELLA OXYTOCA   Serratia marcescens - MIC*    CEFAZOLIN >=64 RESISTANT Resistant     CEFEPIME <=0.12 SENSITIVE Sensitive     CEFTAZIDIME <=1 SENSITIVE Sensitive     CEFTRIAXONE <=0.25 SENSITIVE Sensitive     CIPROFLOXACIN <=0.25 SENSITIVE Sensitive     GENTAMICIN <=1 SENSITIVE Sensitive     TRIMETH/SULFA <=20 SENSITIVE Sensitive     * FEW SERRATIA MARCESCENS  Blood culture (routine x 2)     Status: None   Collection Time: 11/25/19  9:23 PM   Specimen: BLOOD LEFT HAND  Result Value Ref Range Status   Specimen Description BLOOD LEFT HAND  Final   Special Requests   Final    BOTTLES DRAWN AEROBIC AND ANAEROBIC Blood Culture adequate volume   Culture   Final    NO GROWTH 5 DAYS Performed at [facility], [address], [place], [place] [address]    Report Status 11/30/2019 FINAL  Final  Culture, blood (single)     Status: Abnormal   Collection Time: 11/25/19  9:53 PM   Specimen: BLOOD  Result Value Ref Range Status   Specimen Description BLOOD SITE NOT SPECIFIED  Final   Special Requests   Final  BOTTLES DRAWN AEROBIC AND ANAEROBIC Blood Culture adequate volume   Culture  Setup Time   Final    GRAM NEGATIVE RODS ANAEROBIC BOTTLE ONLY CRITICAL VALUE NOTED.  VALUE IS CONSISTENT WITH PREVIOUSLY REPORTED AND CALLED VALUE.    Culture (A)  Final    SERRATIA MARCESCENS SUSCEPTIBILITIES PERFORMED ON PREVIOUS CULTURE WITHIN THE LAST 5  DAYS. Performed at [facility], [place] [address], [place], [place] [address]    Report Status 11/29/2019 FINAL  Final  SARS Coronavirus 2 by RT PCR (hospital order, performed in [facility] hospital lab) Nasopharyngeal Nasopharyngeal Swab     Status: None   Collection Time: 11/26/19 12:06 AM   Specimen: Nasopharyngeal Swab  Result Value Ref Range Status   SARS Coronavirus 2 NEGATIVE NEGATIVE Final    Comment: (NOTE) SARS-CoV-2 target nucleic acids are NOT DETECTED.  The SARS-CoV-2 RNA is generally detectable in upper and lower respiratory specimens during the acute phase of infection. The lowest concentration of SARS-CoV-2 viral copies this assay can detect is 250 copies / mL. A negative result does not preclude SARS-CoV-2 infection and should not be used as the sole basis for treatment or other patient management decisions.  A negative result may occur with improper specimen collection / handling, submission of specimen other than nasopharyngeal swab, presence of viral mutation(s) within the areas targeted by this assay, and inadequate number of viral copies (<250 copies / mL). A negative result must be combined with clinical observations, patient history, and epidemiological information.  Fact Sheet for Patients:   StrictlyIdeas.no  Fact Sheet for Healthcare Providers: BankingDealers.co.za  This test is not yet approved or  cleared by the [place] FDA and has been authorized for detection and/or diagnosis of SARS-CoV-2 by FDA under an Emergency Use Authorization (EUA).  This EUA will remain in effect (meaning this test can be used) for the duration of the COVID-19 declaration under Section 564(b)(1) of the Act, 21 U.S.C. section 360bbb-3(b)(1), unless the authorization is terminated or revoked sooner.  Performed at [facility], [place] [address], [place], [place] [address]   Blood culture (routine x 2)     Status:  None   Collection Time: 11/26/19  5:39 AM   Specimen: BLOOD  Result Value Ref Range Status   Specimen Description BLOOD SITE NOT SPECIFIED  Final   Special Requests   Final    BOTTLES DRAWN AEROBIC AND ANAEROBIC Blood Culture adequate volume   Culture   Final    NO GROWTH 5 DAYS Performed at [facility], [address], [place], [place] [address]    Report Status 12/01/2019 FINAL  Final  MRSA PCR Screening     Status: None   Collection Time: 11/30/19  5:22 PM   Specimen: Nasopharyngeal  Result Value Ref Range Status   MRSA by PCR NEGATIVE NEGATIVE Final    Comment:        The GeneXpert MRSA Assay (FDA approved for NASAL specimens only), is one component of a comprehensive MRSA colonization surveillance program. It is not intended to diagnose MRSA infection nor to guide or monitor treatment for MRSA infections. Performed at [facility], [place] [address] [place], [place] [address]       Studies: No results found.  Scheduled Meds: . buprenorphine-naloxone  1 tablet Sublingual BID  . enoxaparin (LOVENOX) injection  40 mg Subcutaneous Daily  . lidocaine-EPINEPHrine  20 mL Intradermal Once  . nicotine  14 mg Transdermal Daily  .  pantoprazole  40 mg Oral BID AC    Continuous Infusions: . ceFEPime (MAXIPIME) IV 2 g (12/01/19 1406)     LOS: 5 days     Kayleen Memos, MD [facility] Pager [phone]  If 7PM-7AM, please contact night-coverage [email or web address] Password [facility] 12/01/2019, 3:56 PM

## 2019-12-01 NOTE — Anesthesia Postprocedure Evaluation (Signed)
Anesthesia Post Note  Patient: John Davenport  Procedure(s) Performed: TRANSESOPHAGEAL ECHOCARDIOGRAM (TEE) (N/A ) BUBBLE STUDY     Patient location during evaluation: Endoscopy Anesthesia Type: General Level of consciousness: awake Pain management: pain level controlled Vital Signs Assessment: post-procedure vital signs reviewed and stable Respiratory status: spontaneous breathing Cardiovascular status: stable Postop Assessment: no apparent nausea or vomiting Anesthetic complications: no   No complications documented.  Last Vitals:  Vitals:   11/30/19 2025 12/01/19 0430  BP: (!) 97/59 108/67  Pulse: 74 69  Resp: 18 18  Temp: 37.1 C 36.7 C  SpO2: 99% 99%    Last Pain:  Vitals:   12/01/19 0800  TempSrc:   PainSc: 9                  Yosiel Thieme

## 2019-12-02 MED ORDER — LEVOFLOXACIN 500 MG PO TABS: 500.0000 mg | ORAL_TABLET | Freq: Every day | ORAL | 0 refills | Status: AC

## 2019-12-02 MED ORDER — AMOXICILLIN 500 MG PO CAPS
500.0000 mg | ORAL_CAPSULE | Freq: Two times a day (BID) | ORAL | Status: DC
  Administered 2019-12-02: 500 mg via ORAL
  Filled 2019-12-02 (×2): qty 1

## 2019-12-02 MED ORDER — PANTOPRAZOLE SODIUM 40 MG PO TBEC: 40.0000 mg | DELAYED_RELEASE_TABLET | Freq: Every day | ORAL | 0 refills | Status: DC

## 2019-12-02 MED ORDER — AMOXICILLIN 500 MG PO CAPS: 500.0000 mg | ORAL_CAPSULE | Freq: Two times a day (BID) | ORAL | 0 refills | Status: AC

## 2019-12-02 MED ORDER — LEVOFLOXACIN 500 MG PO TABS
500.0000 mg | ORAL_TABLET | Freq: Every day | ORAL | Status: DC
  Administered 2019-12-02: 500 mg via ORAL
  Filled 2019-12-02: qty 1

## 2019-12-02 MED ORDER — NICOTINE 14 MG/24HR TD PT24: 14.0000 mg | MEDICATED_PATCH | Freq: Every day | TRANSDERMAL | 0 refills | Status: DC

## 2019-12-02 MED ORDER — SACCHAROMYCES BOULARDII 250 MG PO CAPS: 250.0000 mg | ORAL_CAPSULE | Freq: Two times a day (BID) | ORAL | 0 refills | Status: AC

## 2019-12-02 NOTE — Progress Notes (Signed)
Patient made decision to leave AMA.  He is alert and oriented x4 and understands the risks associated with leaving AMA.  Discussed the need to have follow up with Suboxone clinic post discharge to avoid IV drug abuse (Heroin) relapse.  Also discussed this, via phone, with his mom who agrees with the plan.  She states she will talk to him and see if he would change his mind and stay until follow up is arranged.  She requested to speak with CM Chriss Driver to obtain information on following up with the Suboxone clinic independently in case he still wanted to leave AMA.  Hassan Rowan spoke with his mom.

## 2019-12-02 NOTE — Progress Notes (Signed)
Physical Therapy Wound Treatment Patient Details  Name: John Davenport MRN: [id number] Date of Birth: [birth year]  Today's Date: 12/02/2019 Time: 1025-1050 Time Calculation (min): 25 min  Subjective  Subjective: "Is this going to hurt?" Patient and Family Stated Goals: go home Date of Onset:  (unknown) Prior Treatments: s/p I&D 12/01/2019  Pain Score: 4/10   Wound Assessment  Wound / Incision (Open or Dehisced) 12/02/19 Incision - Open Arm Left (Active)  Wound Image   12/02/19 1200  Dressing Type Gauze (Comment);Moist to moist 12/02/19 1200  Dressing Changed Changed 12/02/19 1200  Dressing Status Clean;Dry;Intact 12/02/19 1200  Dressing Change Frequency Daily 12/02/19 1200  Site / Wound Assessment Red 12/02/19 1200  % Wound base Red or Granulating 100% 12/02/19 1200  % Wound base Yellow/Fibrinous Exudate 0% 12/02/19 1200  % Wound base Black/Eschar 0% 12/02/19 1200  % Wound base Other/Granulation Tissue (Comment) 0% 12/02/19 1200  Peri-wound Assessment Erythema (blanchable) 12/02/19 1200  Wound Length (cm) 2 cm 12/02/19 1200  Wound Width (cm) 0.5 cm 12/02/19 1200  Wound Depth (cm) 0.5 cm 12/02/19 1200  Wound Volume (cm^3) 0.5 cm^3 12/02/19 1200  Wound Surface Area (cm^2) 1 cm^2 12/02/19 1200  Drainage Amount Minimal 12/02/19 1200  Drainage Description Sanguineous 12/02/19 1200  Non-staged Wound Description Partial thickness 12/02/19 1200  Treatment Hydrotherapy (Pulse lavage);Packing (Plain strip) 12/02/19 1200      Hydrotherapy Pulsed lavage therapy - wound location: left forearm Pulsed Lavage with Suction (psi): 8 psi Pulsed Lavage with Suction - Normal Saline Used: 1000 mL Pulsed Lavage Tip: Tip with splash shield   Wound Assessment and Plan  Wound Therapy - Assess/Plan/Recommendations Wound Therapy - Clinical Statement: Pt presents to hydrotherapy with left forearm abscess s/p I&D 12/19/2019. Pt tolerated hydrotherapy and dressing change well. Reviewed dressing change  frequency and technique with pt/pt mother. Both verbalized understanding. Pt has no further hydrotherapy needs.  Wound Therapy - Functional Problem List: edema Factors Delaying/Impairing Wound Healing: Substance abuse Hydrotherapy Plan: Dressing change;Patient/family education;Pulsatile lavage with suction Wound Therapy - Frequency: Other (comment) (d/c) Wound Therapy - Follow Up Recommendations: Other (comment) (no follow up) Wound Plan: see above  Wound Therapy Goals- Improve the function of patient's integumentary system by progressing the wound(s) through the phases of wound healing (inflammation - proliferation - remodeling) by:    Goals will be updated until maximal potential achieved or discharge criteria met.  Discharge criteria: when goals achieved, discharge from hospital, MD decision/surgical intervention, no progress towards goals, refusal/missing three consecutive treatments without notification or medical reason.  GP      Wyona [place]mas, PT, DPT Acute Rehabilitation Services Pager [phone] Office [phone]   Deno Etienne 12/02/2019, 12:35 PM

## 2019-12-02 NOTE — Progress Notes (Signed)
Pt left AMA after signing AMA form.

## 2019-12-02 NOTE — Discharge Instructions (Signed)
Bacteremia, Adult Bacteremia is the presence of bacteria in the blood. When bacteria enter the bloodstream, they can cause a life-threatening reaction called sepsis. Sepsis is a medical emergency. What are the causes? This condition is caused by bacteria that get into the blood. Bacteria can enter the blood from an infection, including:  A skin infection or injury, such as a burn or a cut.  A lung infection (pneumonia).  An infection in the stomach or intestines.  An infection in the bladder or urinary system (urinary tract infection).  A bacterial infection in another part of the body that spreads to the blood. Bacteria can also enter the blood during a dental or medical procedure, from bleeding gums, or through use of an unclean needle. What increases the risk? This condition is more likely to develop in children, older adults, and people who have:  A long-term (chronic) disease or condition like diabetes or chronic kidney failure.  An artificial joint or heart valve, or heart valve disease.  A tube inserted to treat a medical condition, such as a urinary catheter or IV.  A weak disease-fighting system (immune system).  Injected illegal drugs.  Been hospitalized for more than 10 days in a row. What are the signs or symptoms? Symptoms of this condition include:  Fever and chills.  Fast heartbeat and shortness of breath.  Dizziness, weakness, and low blood pressure.  Confusion or anxiety.  Pain in the abdomen, nausea, vomiting, and diarrhea. Bacteremia that has spread to other parts of the body may cause symptoms in those areas. In some cases, there are no symptoms. How is this diagnosed? This condition may be diagnosed with a physical exam and tests, such as:  Blood tests to check for bacteria (cultures) or other signs of infection.  Tests of any tubes that you have had inserted. These tests check for a source of infection.  Urine tests to check for bacteria in the  urine.  Imaging tests, such as an X-ray, a CT scan, an MRI, or a heart ultrasound. These check for a source of infection in other parts of your body, such as your lungs, heart valves, or joints. How is this treated? This condition is usually treated in the hospital. If you are treated at home, you may need to return to the hospital for medicines, blood tests, and evaluation. Treatment may include:  Antibiotic medicines. These may be given by mouth or directly into your blood through an IV. You may need antibiotics for several weeks. At first, you may be given an antibiotic to kill most types of blood bacteria. If tests show that a certain kind of bacteria is causing the problem, you may be given a different antibiotic.  IV fluids.  Removing any catheter or device that could be a source of infection.  Blood pressure and breathing support, if needed.  Surgery to control the source or the spread of infection, such as surgery to remove an implanted device, abscess, or infected tissue. Follow these instructions at home: Medicines  Take over-the-counter and prescription medicines only as told by your health care provider.  If you were prescribed an antibiotic medicine, take it as told by your health care provider. Do not stop taking the antibiotic even if you start to feel better. General instructions   Rest as needed. Ask your health care provider when you may return to normal activities.  Drink enough fluid to keep your urine pale yellow.  Do not use any products that contain nicotine or   tobacco, such as cigarettes, e-cigarettes, and chewing tobacco. If you need help quitting, ask your health care provider.  Keep all follow-up visits as told by your health care provider. This is important. How is this prevented?   Wash your hands regularly with soap and water. If soap and water are not available, use hand sanitizer.  You should wash your hands: ? After using the toilet or changing a  diaper. ? Before preparing, cooking, serving, or eating food. ? While caring for a sick person or while visiting someone in a hospital. ? Before and after changing bandages (dressings) over wounds.  Clean any scrapes or cuts with soap and water and cover them with a clean bandage.  Get vaccinations as recommended by your health care provider.  Practice good oral hygiene. Brush your teeth two times a day, and floss regularly.  Take good care of your skin. This includes bathing and moisturizing on a regular basis. Contact a health care provider if:  Your symptoms get worse, and medicines do not help.  You have severe pain. Get help right away if you have:  Pain.  A fever or chills.  Trouble breathing.  A fast heart rate.  Skin that is blotchy, pale, or clammy.  Confusion.  Weakness.  Lack of energy or unusual sleepiness.  New symptoms that develop after treatment has started. These symptoms may represent a serious problem that is an emergency. Do not wait to see if the symptoms will go away. Get medical help right away. Call your local emergency services (911 in the [place] Do not drive yourself to the hospital. Summary  Bacteremia is the presence of bacteria in the blood. When bacteria enter the bloodstream, they can cause a life-threatening reaction called sepsis.  Bacteremia is usually treated with antibiotic medicines in the hospital.  If you were prescribed an antibiotic medicine, take it as told by your health care provider. Do not stop taking the antibiotic even if you start to feel better.  Get help right away if you have any new symptoms that develop after treatment has started. This information is not intended to replace advice given to you by your health care provider. Make sure you discuss any questions you have with your health care provider. Document Revised: 08/06/2018 Document Reviewed: 08/06/2018 Else[employer] Patient Education  [place].  Skin  Abscess  A skin abscess is an infected area of your skin that contains pus and other material. An abscess can happen in any part of your body. Some abscesses break open (rupture) on their own. Most continue to get worse unless they are treated. The infection can spread deeper into the body and into your blood, which can make you feel sick. A skin abscess is caused by germs that enter the skin through a cut or scrape. It can also be caused by blocked oil and sweat glands or infected hair follicles. This condition is usually treated by:  Draining the pus.  Taking antibiotic medicines.  Placing a warm, wet washcloth over the abscess. Follow these instructions at home: Medicines   Take over-the-counter and prescription medicines only as told by your doctor.  If you were prescribed an antibiotic medicine, take it as told by your doctor. Do not stop taking the antibiotic even if you start to feel better. Abscess care   If you have an abscess that has not drained, place a warm, clean, wet washcloth over the abscess several times a day. Do this as told by your doctor.  Follow instructions from your doctor about how to take care of your abscess. Make sure you: ? Cover the abscess with a bandage (dressing). ? Change your bandage or gauze as told by your doctor. ? Wash your hands with soap and water before you change the bandage or gauze. If you cannot use soap and water, use hand sanitizer.  Check your abscess every day for signs that the infection is getting worse. Check for: ? More redness, swelling, or pain. ? More fluid or blood. ? Warmth. ? More pus or a bad smell. General instructions  To avoid spreading the infection: ? Do not share personal care items, towels, or hot tubs with others. ? Avoid making skin-to-skin contact with other people.  Keep all follow-up visits as told by your doctor. This is important. Contact a doctor if:  You have more redness, swelling, or pain around  your abscess.  You have more fluid or blood coming from your abscess.  Your abscess feels warm when you touch it.  You have more pus or a bad smell coming from your abscess.  You have a fever.  Your muscles ache.  You have chills.  You feel sick. Get help right away if:  You have very bad (severe) pain.  You see red streaks on your skin spreading away from the abscess. Summary  A skin abscess is an infected area of your skin that contains pus and other material.  The abscess is caused by germs that enter the skin through a cut or scrape. It can also be caused by blocked oil and sweat glands or infected hair follicles.  Follow your doctor's instructions on caring for your abscess, taking medicines, preventing infections, and keeping follow-up visits. This information is not intended to replace advice given to you by your health care provider. Make sure you discuss any questions you have with your health care provider. Document Revised: 10/21/2018 Document Reviewed: 04/30/2017 Elsevier Patient Education  2020 [employer].

## 2019-12-02 NOTE — Discharge Summary (Signed)
Discharge Summary  John Davenport YIR:[id number] DOB: 08/04/1975  PCP: Patient, No Pcp Per  Admit date: 11/25/2019 Discharge date: 12/02/2019  Time spent: 35 minutes  Recommendations for Outpatient Follow-up:  1. Follow-up with infectious disease 2. Follow-up with hand surgery, Dr. Harlin Heys III, in 1 week 3. Follow-up with cardiology 4. Follow-up with primary care provider 5. Follow-up with Suboxone clinic 6. Take your medications as prescribed 7. Completely abstain from IV drug abuse  Discharge Diagnoses:  Active Hospital Problems   Diagnosis Date Noted  . Bacteremia 11/26/2019  . Abscess 11/26/2019  . Hypokalemia 11/26/2019  . IV drug user 11/26/2019  . Chronic combined systolic and diastolic CHF (congestive heart failure) ([place]) 03/22/2010    Resolved Hospital Problems  No resolved problems to display.    Discharge Condition: Stable  Diet recommendation: Heart healthy diet.  Vitals:   12/02/19 0306 12/02/19 0833  BP: 108/65 94/62  Pulse: 84 76  Resp: 17 16  Temp: 98.1 F (36.7 C) 98.2 F (36.8 C)  SpO2:  97%    History of present illness:  John A Tuckeris a 44 y.o.malewith medical history significant forIV drug use, paroxysmal ventricular tachycardia with history of ICD placement and removal in 06/2018 due to MSSA bacteremia withvegetation on device, CAD,OSAnot on CPAP, chronic combined systolic and diastolic CHF, CVA, CKD stage III, hepatitis C who presents with concerns of chest pain. Patient presented with similar symptoms of chest pain on 11/24/19 to the ED. He was noted to have a troponin of 11 with no ischemic changes seen on EKG. Had mild leukocytosis of 16.5K, was afebrile without tachycardia. He was also noted to have left upper extremity abscess that was drained in the ED. There was plan for admission for IV antibiotics but patient left AMA. Blood culture obtained at that time laterreturnedpositive for Serratia.  Returned, and  [facility] asked to admit for bacteremia.   Reports new abscess in his left upper extremity at dorsal forearm.  Abscess at [facility] site is post I&D on 11/24/19 with fluid positive for klebsiella oxytoca and serratia marcescens.  MRSA screening negative.  Post I&D of left dorsal forearm on 12/01/19 by hand surgery, John Davenport.  Fluid positive for MODERATE GRAM POSITIVE COCCI IN PAIRS IN CLUSTERS  MODERATE GRAM NEGATIVE RODS.  Switched to po Levaquin 500 mg daily x 6 days.  Added po amoxicillin 500 mg BID x 6 days.  12/02/19:  Seen and examined.  Planned PT hydrotherapy.  He is eager to go home.  Advised to keep his appointments.  Patient understands and agrees to plan.  Hospital Course:  Principal Problem:   Bacteremia Active Problems:   Chronic combined systolic and diastolic CHF (congestive heart failure) (HCC)   Abscess   Hypokalemia   IV drug user  Serratia M. bacteremia in setting of IV drug abuse -TEE 11/30/19, negative for vegetation -Infectious disease recommendations: Continue with cefepime now and can discharge on levofloxacin 500 mg daily to complete 14 days total through September 9th.   Follow-up with infectious disease  Left upper extremity klebsiella oxytoca and serratia marcescens abscess post I&D in the ED 11/24/19, new developing abscess in the same extremity now post I&D by ortho on 12/01/19 Abscess fluid sent for analysis,  Fluid positive for MODERATE GRAM POSITIVE COCCI IN PAIRS IN CLUSTERS  MODERATE GRAM NEGATIVE RODS.  Switched to po Levaquin 500 mg daily x 6 days.  Added po amoxicillin 500 mg BID x 6 days. Continue antibiotics as recommended by infectious  disease Follow-up with hand surgery in 1 week.  Chronic combined systolic and diastolic heart failure -EF of 15-20%  -ICD removed in April 2020 due to MSSA bacteremia. Deemed not to be a candidate for replacement due to continued IV drug use Follow-up with cardiology  IV drug abuse -Endorsed heroin use 2 days prior to  admission -Continue Suboxone  -Completely abstain from IV drug abuse. Follow-up with Suboxone clinic  GERD -Protonix 40 mg daily Follow-up with your PCP    Code Status:Full code   Consultants:  ID  Orthopedic surgery/hand surgery.  Procedures:  TEE 9/1  I&D x2   Discharge Exam: BP 94/62 (BP Location: Left Arm)   Pulse 76   Temp 98.2 F (36.8 C) (Oral)   Resp 16   Ht 6\' 3"  (1.905 m)   Wt 78.7 kg   SpO2 97%   BMI 21.69 kg/m  . General: 44 y.o. year-old male well developed well nourished in no acute distress.  Alert and oriented x3. . Cardiovascular: Regular rate and rhythm with no rubs or gallops.  Marland Kitchen Respiratory: Clear to auscultation with no wheezes or rales.  . Abdomen: Soft nontender nondistended with normal bowel sounds x4 quadrants. . Musculoskeletal: No lower extremity edema. 2/4 pulses in all 4 extremities. . Skin: Left forearm in surgical dressings. . Psychiatry: Mood is appropriate for condition and setting  Discharge Instructions You were cared for by a hospitalist during your hospital stay. If you have any questions about your discharge medications or the care you received while you were in the hospital after you are discharged, you can call the unit and asked to speak with the hospitalist on call if the hospitalist that took care of you is not available. Once you are discharged, your primary care physician will handle any further medical issues. Please note that NO REFILLS for any discharge medications will be authorized once you are discharged, as it is imperative that you return to your primary care physician (or establish a relationship with a primary care physician if you do not have one) for your aftercare needs so that they can reassess your need for medications and monitor your lab values.   Allergies as of 12/02/2019   No Known Allergies     Medication List    STOP taking these medications   amiodarone 200 MG tablet Commonly known  as: PACERONE   carvedilol 6.25 MG tablet Commonly known as: COREG   clindamycin 150 MG capsule Commonly known as: CLEOCIN   digoxin 0.125 MG tablet Commonly known as: Lanoxin   furosemide 40 MG tablet Commonly known as: LASIX   losartan 25 MG tablet Commonly known as: COZAAR   nitroGLYCERIN 0.4 MG SL tablet Commonly known as: NITROSTAT   ondansetron 4 MG disintegrating tablet Commonly known as: ZOFRAN-ODT   Rivaroxaban 15 MG Tabs tablet Commonly known as: XARELTO   spironolactone 25 MG tablet Commonly known as: ALDACTONE     TAKE these medications   amoxicillin 500 MG capsule Commonly known as: AMOXIL Take 1 capsule (500 mg total) by mouth every 12 (twelve) hours for 6 days.   EPINEPHrine 0.3 mg/0.3 mL Soaj injection Commonly known as: EPI-PEN Inject 0.3 mLs (0.3 mg total) into the muscle as needed for anaphylaxis.   levofloxacin 500 MG tablet Commonly known as: LEVAQUIN Take 1 tablet (500 mg total) by mouth daily for 6 days.   nicotine 14 mg/24hr patch Commonly known as: NICODERM CQ - dosed in mg/24 hours Place 1 patch (14 mg  total) onto the skin daily. Start taking on: December 03, 2019   pantoprazole 40 MG tablet Commonly known as: PROTONIX Take 1 tablet (40 mg total) by mouth daily.   saccharomyces boulardii 250 MG capsule Commonly known as: Florastor Take 1 capsule (250 mg total) by mouth 2 (two) times daily for 16 days.      No Known Allergies  Follow-up Information    Larey Dresser, MD. Call in 1 day(s).   Specialty: Cardiology Why: Please call for a post hospital follow-up appointment. Contact information: [place] [address] [phone]        Verner Mould, MD. Call in 1 day(s).   Why: Please call for a post hospital follow-up appointment. Contact information: [address] [place] [address] [phone]        Thayer Headings, MD. Call in 1 day(s).   Specialty: Infectious  Diseases Why: Please call for a post hospital follow-up appointment. Contact information: [address] [place] [address] [phone]        Larey Dresser, MD. Call in 1 day(s).   Specialty: Cardiology Why: Please call for a post hospital follow-up appointment. Contact information: [address] [place] [address] [phone]        Evans Lance, MD. Call in 1 day(s).   Specialty: Cardiology Why: Please call for a post hospital follow-up appointment. Contact information: [address] [place] [address] [phone]                The results of significant diagnostics from this hospitalization (including imaging, microbiology, ancillary and laboratory) are listed below for reference.    Significant Diagnostic Studies: DG Chest 2 View  Result Date: 11/25/2019 CLINICAL DATA:  44 year old male with chest pain. EXAM: CHEST - 2 VIEW COMPARISON:  Chest radiograph dated 11/24/2019. FINDINGS: There is no focal consolidation, pleural effusion or pneumothorax. The cardiac silhouette is within limits. Median sternotomy wires and CABG vascular clips. No acute osseous pathology. IMPRESSION: No active cardiopulmonary disease. Electronically Signed   By: Anner Crete M.D.   On: 11/25/2019 19:23   DG Chest Port 1 View  Result Date: 11/24/2019 CLINICAL DATA:  Left chest pain EXAM: PORTABLE CHEST 1 VIEW COMPARISON:  07/17/2018 FINDINGS: Postop CABG. Heart size upper normal. Mild vascular congestion without edema or effusion. No focal infiltrate. No acute skeletal abnormality. IMPRESSION: Prominent heart size with mild vascular congestion. Negative for edema. Electronically Signed   By: Franchot Gallo M.D.   On: 11/24/2019 10:59   ECHOCARDIOGRAM COMPLETE  Result Date: 11/26/2019    ECHOCARDIOGRAM REPORT   Patient Name:   DEAGO BURRUSS Date of Exam: 11/26/2019 Medical Rec #:  [id number]            Height:        75.0 in Accession #:    [id number]           Weight:       165.0 lb Date of Birth:  1975-08-03             BSA:          2.022 m Patient Age:    15 years             BP:           135/90 mmHg Patient Gender: M  HR:           93 bpm. Exam Location:  Inpatient Procedure: 2D Echo, Color Doppler and Cardiac Doppler Indications:    R07.9* Chest pain, unspecified  History:        Patient has prior history of Echocardiogram examinations, most                 recent 07/16/2018. CHF, CAD; Risk Factors:IVDU, Sleep Apnea and                 Dyslipidemia. ICD Wire removed 07/16/18 due to infection.  Sonographer:    Raquel Sarna Senior RDCS Referring Phys: [id number] Apex  1. There is calcification seen in the apex of the LV. This could represent a calcified mural thrombus. Best seen on apical 4C views. Difficult to distinguish from myocardial trabeculation. Would recommend limited contrast echocardiogram to exclude thrombus. EF severely reduced. Septum into apex akinetic with severe global hypokinesis. Left ventricular ejection fraction, by estimation, is 15-20%. The left ventricle has severely decreased function. The left ventricle demonstrates regional wall motion abnormalities (see scoring diagram/findings for description). The left ventricular internal cavity size was severely dilated. Indeterminate diastolic filling due to E-A fusion.  2. Right ventricular systolic function is mildly reduced. The right ventricular size is normal. There is normal pulmonary artery systolic pressure. The estimated right ventricular systolic pressure is 01.0 mmHg.  3. Left atrial size was mild to moderately dilated.  4. The mitral valve is grossly normal. Mild mitral valve regurgitation. No evidence of mitral stenosis.  5. The aortic valve is tricuspid. Aortic valve regurgitation is not visualized. No aortic stenosis is present.  6. The inferior vena cava is normal in size with greater than 50% respiratory variability,  suggesting right atrial pressure of 3 mmHg. Comparison(s): Changes from prior study are noted. EF unchanged. WMA unchanged. Concerns for calcified mural thrombus as described above. FINDINGS  Left Ventricle: There is calcification seen in the apex of the LV. This could represent a calcified mural thrombus. Best seen on apical 4C views. Difficult to distinguish from myocardial trabeculation. Would recommend limited contrast echocardiogram to exclude thrombus. EF severely reduced. Septum into apex akinetic with severe global hypokinesis. Left ventricular ejection fraction, by estimation, is 15-20%. The left ventricle has severely decreased function. The left ventricle demonstrates regional wall motion abnormalities. The left ventricular internal cavity size was severely dilated. There is no left ventricular hypertrophy. Indeterminate diastolic filling due to E-A fusion.  LV Wall Scoring: The entire septum and apex are akinetic. Right Ventricle: The right ventricular size is normal. No increase in right ventricular wall thickness. Right ventricular systolic function is mildly reduced. There is normal pulmonary artery systolic pressure. The tricuspid regurgitant velocity is 2.46 m/s, and with an assumed right atrial pressure of 3 mmHg, the estimated right ventricular systolic pressure is 27.2 mmHg. Left Atrium: Left atrial size was mild to moderately dilated. Right Atrium: Right atrial size was normal in size. Pericardium: Trivial pericardial effusion is present. Mitral Valve: The mitral valve is grossly normal. Mild mitral valve regurgitation. No evidence of mitral valve stenosis. Tricuspid Valve: The tricuspid valve is grossly normal. Tricuspid valve regurgitation is trivial. No evidence of tricuspid stenosis. Aortic Valve: The aortic valve is tricuspid. Aortic valve regurgitation is not visualized. No aortic stenosis is present. Pulmonic Valve: The pulmonic valve was grossly normal. Pulmonic valve regurgitation is  not visualized. No evidence of pulmonic stenosis. Aorta: The aortic root is normal in size and structure. Venous:  The inferior vena cava is normal in size with greater than 50% respiratory variability, suggesting right atrial pressure of 3 mmHg. IAS/Shunts: The atrial septum is grossly normal.  LEFT VENTRICLE PLAX 2D LVIDd:         7.10 cm      Diastology LVIDs:         6.30 cm      LV e' lateral: 8.16 cm/s LV PW:         0.80 cm      LV e' medial:  4.90 cm/s LV IVS:        0.50 cm LVOT diam:     2.40 cm LV SV:         38 LV SV Index:   19 LVOT Area:     4.52 cm  LV Volumes (MOD) LV vol d, MOD A2C: 232.0 ml LV vol d, MOD A4C: 214.0 ml LV vol s, MOD A2C: 193.0 ml LV vol s, MOD A4C: 184.0 ml LV SV MOD A2C:     39.0 ml LV SV MOD A4C:     214.0 ml LV SV MOD BP:      37.7 ml RIGHT VENTRICLE RV S prime:     10.70 cm/s TAPSE (M-mode): 1.6 cm LEFT ATRIUM             Index       RIGHT ATRIUM           Index LA diam:        4.60 cm 2.27 cm/m  RA Area:     18.40 cm LA Vol (A2C):   60.1 ml 29.72 ml/m RA Volume:   46.10 ml  22.80 ml/m LA Vol (A4C):   83.8 ml 41.44 ml/m LA Biplane Vol: 75.4 ml 37.29 ml/m  AORTIC VALVE LVOT Vmax:   63.30 cm/s LVOT Vmean:  43.200 cm/s LVOT VTI:    0.085 m  AORTA Ao Root diam: 3.50 cm TRICUSPID VALVE TR Peak grad:   24.2 mmHg TR Vmax:        246.00 cm/s  SHUNTS Systemic VTI:  0.08 m Systemic Diam: 2.40 cm Eleonore Chiquito MD Electronically signed by Eleonore Chiquito MD Signature Date/Time: 11/26/2019/1:15:34 PM    Final    [place] LT UPPER EXTREM LTD SOFT TISSUE NON VASCULAR  Result Date: 11/28/2019 CLINICAL DATA:  Left arm abscess, swelling EXAM: ULTRASOUND LEFT UPPER EXTREMITY LIMITED TECHNIQUE: Ultrasound examination of the upper extremity soft tissues was performed in the area of clinical concern. COMPARISON:  None. FINDINGS: Real-time sonography of the left antecubital fossa was performed. Soft tissue edema around the wound. No definable fluid collection to suggest an abscess. No hematoma or  soft tissue mass. IMPRESSION: No definable fluid collection at the site of the wound in the left antecubital fossa. Electronically Signed   By: Kathreen Devoid   On: 11/28/2019 13:31   ECHO TEE  Result Date: 11/30/2019    TRANSESOPHOGEAL ECHO REPORT   Patient Name:   John Davenport Date of Exam: 11/30/2019 Medical Rec #:  [id number]            Height:       75.0 in Accession #:    [id number]           Weight:       164.2 lb Date of Birth:  [birth year]             BSA:          2.018 m Patient Age:  44 years             BP:           102/76 mmHg Patient Gender: M                    HR:           88 bpm. Exam Location:  Inpatient Procedure: Transesophageal Echo, Color Doppler, Cardiac Doppler, Saline Contrast            Bubble Study and Intracardiac Opacification Agent Indications:     Bacteremia  History:         Patient has prior history of Echocardiogram examinations, most                  recent 11/26/2019. CHF, CAD; Risk Factors:Dyslipidemia, Sleep                  Apnea and IVDU.  Sonographer:     Raquel Sarna Senior RDCS Sonographer#2:   Bernadene Person RDCS Referring Phys:  [id number] Abigail Butts Diagnosing Phys: Cherlynn Kaiser MD PROCEDURE: After discussion of the risks and benefits of a TEE, an informed consent was obtained from the patient. TEE procedure time was 20 minutes. The transesophogeal probe was passed without difficulty through the esophogus of the patient. Imaged were obtained with the patient in a left lateral decubitus position. Local oropharyngeal anesthetic was provided with Cetacaine. Sedation performed by different physician. The patient was monitored while under deep sedation. Anesthestetic sedation was provided intravenously by Anesthesiology: 157mg  of Propofol. Image quality was good. The patient's vital signs; including heart rate, blood pressure, and oxygen saturation; remained stable throughout the procedure. Supplementary images were obtained from  transthoracic windows as indicated to  answer the clinical question. The patient developed no complications during the procedure. IMPRESSIONS  1. Left ventricular ejection fraction, by estimation, is 15%. The left ventricle has severely decreased function. The left ventricle demonstrates global hypokinesis. The left ventricular internal cavity size was severely dilated.  2. Transthoracic images obtained to further evaluate LV apex. No definite mural thrombus. There is evidence of swirling contrast, and possible calcified trabeculations.  3. Right ventricular systolic function is mildly reduced. The right ventricular size is normal.  4. Left atrial size was mildly dilated. No left atrial/left atrial appendage thrombus was detected. The LAA emptying velocity was 43 cm/s.  5. The mitral valve is grossly normal. 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 Multiple small jets, one main central jet of mitral valve regurgitation.  6. The tricuspid valve is degenerative. Tricuspid valve regurgitation is mild to moderate.  7. The aortic valve is normal in structure. Aortic valve regurgitation is trivial. No aortic stenosis is present.  8. Agitated saline contrast bubble study was positive with shunting observed after 12 cardiac cycles suggestive of intrapulmonary shunting.  9. Normal size ascending aorta. No aortic atherosclerosis in the visualized thoracic aorta. Conclusion(s)/Recommendation(s): No evidence of vegetation/infective endocarditis on this transesophageal echocardiogram. FINDINGS  Left Ventricle: Left ventricular ejection fraction, by estimation, is 15%. The left ventricle has severely decreased function. The left ventricle demonstrates global hypokinesis. Definity contrast agent was given IV to delineate the left ventricular endocardial borders. The left ventricular internal cavity size was severely dilated. There is no left ventricular hypertrophy. Right Ventricle: The right ventricular size is normal. No increase in right  ventricular wall thickness. Right ventricular systolic function is mildly reduced. Left Atrium: Left atrial size was mildly dilated. No left atrial/left atrial  appendage thrombus was detected. The LAA emptying velocity was 43 cm/s. Right Atrium: Right atrial size was normal in size. Pericardium: There is no evidence of pericardial effusion. Mitral Valve: The mitral valve is grossly normal. 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 Multiple small jets, one main central jet of mitral valve regurgitation. Tricuspid Valve: The tricuspid valve is degenerative in appearance. Tricuspid valve regurgitation is mild to moderate. Aortic Valve: The aortic valve is normal in structure. Aortic valve regurgitation is trivial. No aortic stenosis is present. Pulmonic Valve: The pulmonic valve was normal in structure. Pulmonic valve regurgitation is trivial. Aorta: Normal size ascending aorta. No aortic atherosclerosis in the visualized thoracic aorta. The aortic root and ascending aorta are structurally normal, with no evidence of dilitation. IAS/Shunts: No atrial level shunt detected by color flow Doppler. Agitated saline contrast was given intravenously to evaluate for intracardiac shunting. Agitated saline contrast bubble study was positive with shunting observed after >6 cardiac cycles suggestive of intrapulmonary shunting.  LEFT VENTRICLE PLAX 2D LVOT diam:     2.40 cm LVOT Area:     4.52 cm  MR Peak grad:    81.4 mmHg MR Mean grad:    55.0 mmHg   SHUNTS MR Vmax:         451.00 cm/s Systemic Diam: 2.40 cm MR Vmean:        350.0 cm/s MR PISA:         2.26 cm MR PISA Eff ROA: 23 mm MR PISA Radius:  0.60 cm Cherlynn Kaiser MD Electronically signed by Cherlynn Kaiser MD Signature Date/Time: 11/30/2019/10:21:39 AM    Final     Microbiology: Recent Results (from the past 240 hour(s))  Blood culture (routine x 2)     Status: Abnormal   Collection Time: 11/24/19 11:05 AM   Specimen: BLOOD  Result  Value Ref Range Status   Specimen Description   Final    BLOOD RIGHT ANTECUBITAL Performed at [facility], [address] [place], [place] [address]    Special Requests   Final    BOTTLES DRAWN AEROBIC AND ANAEROBIC Blood Culture adequate volume Performed at [facility], [address] [place], [place] [address]    Culture  Setup Time   Final    GRAM NEGATIVE RODS IN BOTH AEROBIC AND ANAEROBIC BOTTLES Organism ID to follow CRITICAL RESULT CALLED TO, READ BACK BY AND VERIFIED WITH: Rosemarie Ax RN 10:25 11/25/19 ([email or web address]) Performed at [facility], [address] [place], [place] [address]    Culture SERRATIA MARCESCENS (A)  Final   Report Status 11/27/2019 FINAL  Final   Organism ID, Bacteria SERRATIA MARCESCENS  Final      Susceptibility   Serratia marcescens - MIC*    CEFAZOLIN >=64 RESISTANT Resistant     CEFEPIME <=0.12 SENSITIVE Sensitive     CEFTAZIDIME <=1 SENSITIVE Sensitive     CEFTRIAXONE <=0.25 SENSITIVE Sensitive     CIPROFLOXACIN <=0.25 SENSITIVE Sensitive     GENTAMICIN <=1 SENSITIVE Sensitive     TRIMETH/SULFA <=20 SENSITIVE Sensitive     * SERRATIA MARCESCENS  Blood Culture ID Panel (Reflexed)     Status: Abnormal   Collection Time: 11/24/19 11:05 AM  Result Value Ref Range Status   Enterococcus faecalis NOT DETECTED NOT DETECTED Final   Enterococcus Faecium NOT DETECTED NOT DETECTED Final   Listeria monocytogenes NOT DETECTED NOT DETECTED Final   Staphylococcus species NOT DETECTED NOT DETECTED Final  Staphylococcus aureus (BCID) NOT DETECTED NOT DETECTED Final   Staphylococcus epidermidis NOT DETECTED NOT DETECTED Final   Staphylococcus lugdunensis NOT DETECTED NOT DETECTED Final   Streptococcus species NOT DETECTED NOT DETECTED Final   Streptococcus agalactiae NOT DETECTED NOT DETECTED Final   Streptococcus pneumoniae NOT DETECTED NOT DETECTED Final   Streptococcus pyogenes NOT DETECTED NOT DETECTED Final    A.calcoaceticus-baumannii NOT DETECTED NOT DETECTED Final   Bacteroides fragilis NOT DETECTED NOT DETECTED Final   Enterobacterales DETECTED (A) NOT DETECTED Final    Comment: Enterobacterales represent a large order of gram negative bacteria, not a single organism. CRITICAL RESULT CALLED TO, READ BACK BY AND VERIFIED WITH: Rosemarie Ax RN 10:25 11/25/19 ([email or web address])    Enterobacter cloacae complex NOT DETECTED NOT DETECTED Final   Escherichia coli NOT DETECTED NOT DETECTED Final   Klebsiella aerogenes NOT DETECTED NOT DETECTED Final   Klebsiella oxytoca NOT DETECTED NOT DETECTED Final   Klebsiella pneumoniae NOT DETECTED NOT DETECTED Final   Proteus species NOT DETECTED NOT DETECTED Final   Salmonella species NOT DETECTED NOT DETECTED Final   Serratia marcescens DETECTED (A) NOT DETECTED Final    Comment: CRITICAL RESULT CALLED TO, READ BACK BY AND VERIFIED WITH: Rosemarie Ax RN 10:25 11/25/19 ([email or web address])    Haemophilus influenzae NOT DETECTED NOT DETECTED Final   Neisseria meningitidis NOT DETECTED NOT DETECTED Final   Pseudomonas aeruginosa NOT DETECTED NOT DETECTED Final   Stenotrophomonas maltophilia NOT DETECTED NOT DETECTED Final   Candida albicans NOT DETECTED NOT DETECTED Final   Candida auris NOT DETECTED NOT DETECTED Final   Candida glabrata NOT DETECTED NOT DETECTED Final   Candida krusei NOT DETECTED NOT DETECTED Final   Candida parapsilosis NOT DETECTED NOT DETECTED Final   Candida tropicalis NOT DETECTED NOT DETECTED Final   Cryptococcus neoformans/gattii NOT DETECTED NOT DETECTED Final   CTX-M ESBL NOT DETECTED NOT DETECTED Final   Carbapenem resistance IMP NOT DETECTED NOT DETECTED Final   Carbapenem resistance KPC NOT DETECTED NOT DETECTED Final   Carbapenem resistance NDM NOT DETECTED NOT DETECTED Final   Carbapenem resist OXA 48 LIKE NOT DETECTED NOT DETECTED Final   Carbapenem resistance VIM NOT DETECTED NOT DETECTED Final    Comment: Performed at [facility],  [address] [place], [place] [address]  Wound or Superficial Culture     Status: None   Collection Time: 11/24/19 11:57 AM   Specimen: Abscess; Wound  Result Value Ref Range Status   Specimen Description   Final    ABSCESS Performed at [facility], [address] [place], [place] [address]    Special Requests   Final    NONE Performed at [facility], [address] [place], [place] [address]    Gram Stain   Final    ABUNDANT WBC PRESENT,BOTH PMN AND MONONUCLEAR MODERATE GRAM POSITIVE COCCI FEW GRAM NEGATIVE RODS Performed at [facility], [place] [address] [place], [place] [address]    Culture FEW KLEBSIELLA OXYTOCA FEW SERRATIA MARCESCENS   Final   Report Status 11/27/2019 FINAL  Final   Organism ID, Bacteria KLEBSIELLA OXYTOCA  Final   Organism ID, Bacteria SERRATIA MARCESCENS  Final      Susceptibility   Klebsiella oxytoca - MIC*    AMPICILLIN RESISTANT Resistant     CEFAZOLIN <=4 SENSITIVE Sensitive     CEFEPIME <=0.12 SENSITIVE Sensitive     CEFTAZIDIME <=1 SENSITIVE Sensitive     CEFTRIAXONE <=0.25 SENSITIVE  Sensitive     CIPROFLOXACIN <=0.25 SENSITIVE Sensitive     GENTAMICIN <=1 SENSITIVE Sensitive     IMIPENEM <=0.25 SENSITIVE Sensitive     TRIMETH/SULFA <=20 SENSITIVE Sensitive     AMPICILLIN/SULBACTAM 8 SENSITIVE Sensitive     PIP/TAZO <=4 SENSITIVE Sensitive     * FEW KLEBSIELLA OXYTOCA   Serratia marcescens - MIC*    CEFAZOLIN >=64 RESISTANT Resistant     CEFEPIME <=0.12 SENSITIVE Sensitive     CEFTAZIDIME <=1 SENSITIVE Sensitive     CEFTRIAXONE <=0.25 SENSITIVE Sensitive     CIPROFLOXACIN <=0.25 SENSITIVE Sensitive     GENTAMICIN <=1 SENSITIVE Sensitive     TRIMETH/SULFA <=20 SENSITIVE Sensitive     * FEW SERRATIA MARCESCENS  Blood culture (routine x 2)     Status: None   Collection Time: 11/25/19  9:23 PM   Specimen: BLOOD LEFT HAND  Result Value Ref Range Status   Specimen Description BLOOD LEFT HAND  Final   Special  Requests   Final    BOTTLES DRAWN AEROBIC AND ANAEROBIC Blood Culture adequate volume   Culture   Final    NO GROWTH 5 DAYS Performed at [facility], [address], [place], [place] [address]    Report Status 11/30/2019 FINAL  Final  Culture, blood (single)     Status: Abnormal   Collection Time: 11/25/19  9:53 PM   Specimen: BLOOD  Result Value Ref Range Status   Specimen Description BLOOD SITE NOT SPECIFIED  Final   Special Requests   Final    BOTTLES DRAWN AEROBIC AND ANAEROBIC Blood Culture adequate volume   Culture  Setup Time   Final    GRAM NEGATIVE RODS ANAEROBIC BOTTLE ONLY CRITICAL VALUE NOTED.  VALUE IS CONSISTENT WITH PREVIOUSLY REPORTED AND CALLED VALUE.    Culture (A)  Final    SERRATIA MARCESCENS SUSCEPTIBILITIES PERFORMED ON PREVIOUS CULTURE WITHIN THE LAST 5 DAYS. Performed at [facility], [place] [address], [place], [place] [address]    Report Status 11/29/2019 FINAL  Final  SARS Coronavirus 2 by RT PCR (hospital order, performed in [facility] hospital lab) Nasopharyngeal Nasopharyngeal Swab     Status: None   Collection Time: 11/26/19 12:06 AM   Specimen: Nasopharyngeal Swab  Result Value Ref Range Status   SARS Coronavirus 2 NEGATIVE NEGATIVE Final    Comment: (NOTE) SARS-CoV-2 target nucleic acids are NOT DETECTED.  The SARS-CoV-2 RNA is generally detectable in upper and lower respiratory specimens during the acute phase of infection. The lowest concentration of SARS-CoV-2 viral copies this assay can detect is 250 copies / mL. A negative result does not preclude SARS-CoV-2 infection and should not be used as the sole basis for treatment or other patient management decisions.  A negative result may occur with improper specimen collection / handling, submission of specimen other than nasopharyngeal swab, presence of viral mutation(s) within the areas targeted by this assay, and inadequate number of viral copies (<250 copies / mL). A negative  result must be combined with clinical observations, patient history, and epidemiological information.  Fact Sheet for Patients:   StrictlyIdeas.no  Fact Sheet for Healthcare Providers: BankingDealers.co.za  This test is not yet approved or  cleared by the [place] FDA and has been authorized for detection and/or diagnosis of SARS-CoV-2 by FDA under an Emergency Use Authorization (EUA).  This EUA will remain in effect (meaning this test can be used) for the duration of the COVID-19 declaration under Section  564(b)(1) of the Act, 21 U.S.C. section 360bbb-3(b)(1), unless the authorization is terminated or revoked sooner.  Performed at [facility], [place] [address], [place], [place] [address]   Blood culture (routine x 2)     Status: None   Collection Time: 11/26/19  5:39 AM   Specimen: BLOOD  Result Value Ref Range Status   Specimen Description BLOOD SITE NOT SPECIFIED  Final   Special Requests   Final    BOTTLES DRAWN AEROBIC AND ANAEROBIC Blood Culture adequate volume   Culture   Final    NO GROWTH 5 DAYS Performed at [facility], [address], [place], [place] [address]    Report Status 12/01/2019 FINAL  Final  MRSA PCR Screening     Status: None   Collection Time: 11/30/19  5:22 PM   Specimen: Nasopharyngeal  Result Value Ref Range Status   MRSA by PCR NEGATIVE NEGATIVE Final    Comment:        The GeneXpert MRSA Assay (FDA approved for NASAL specimens only), is one component of a comprehensive MRSA colonization surveillance program. It is not intended to diagnose MRSA infection nor to guide or monitor treatment for MRSA infections. Performed at [facility], [place] [address], [place], [place] [address]   Aerobic Culture (superficial specimen)     Status: None (Preliminary result)   Collection Time: 12/01/19 12:32 PM   Specimen: Wound  Result Value Ref Range Status   Specimen Description WOUND  LEFT FOREARM  Final   Special Requests NONE  Final   Gram Stain   Final    ABUNDANT WBC PRESENT, PREDOMINANTLY PMN MODERATE GRAM POSITIVE COCCI IN PAIRS IN CLUSTERS MODERATE GRAM NEGATIVE RODS Performed at [facility], [place] [address], [place], [place] [address]    Culture PENDING  Incomplete   Report Status PENDING  Incomplete     Labs: Basic Metabolic Panel: Recent Labs  Lab 11/26/19 0539 11/27/19 0327 11/28/19 0232 11/29/19 0334 11/30/19 0444  NA 136 135 136 136 136  K 3.4* 4.2 4.7 4.3 4.1  CL 102 104 101 100 98  CO2 20* 23 25 27 30   GLUCOSE 104* 93 93 88 96  BUN 15 14 12 11 13   CREATININE 0.84 0.98 0.85 0.95 0.91  CALCIUM 9.1 8.8* 9.3 8.9 9.0  MG  --  1.6*  --  1.8  --   PHOS  --  2.5  --   --   --    Liver Function Tests: Recent Labs  Lab 11/25/19 2003 11/27/19 0327  AST 20 18  ALT 15 12  ALKPHOS 257* 152*  BILITOT 0.8 0.6  PROT 9.2* 6.8  ALBUMIN 2.9* 2.2*   No results for input(s): LIPASE, AMYLASE in the last 168 hours. No results for input(s): AMMONIA in the last 168 hours. CBC: Recent Labs  Lab 11/25/19 2003 11/25/19 2003 11/26/19 0539 11/27/19 0327 11/28/19 0232 11/29/19 0334 11/30/19 0444  WBC 17.5*   < > 17.0* 13.6* 10.9* 12.7* 11.1*  NEUTROABS 14.7*  --   --  8.1*  --   --   --   HGB 14.8   < > 13.9 12.1* 13.9 12.0* 11.1*  HCT 46.2   < > 42.5 38.1* 44.8 39.3 36.2*  MCV 82.9   < > 82.8 83.0 86.3 86.8 86.4  PLT 354   < > 421* 377 290 315 274   < > = values in this interval not displayed.  Cardiac Enzymes: No results for input(s): CKTOTAL, CKMB, CKMBINDEX, TROPONINI in the last 168 hours. BNP: BNP (last 3 results) No results for input(s): BNP in the last 8760 hours.  ProBNP (last 3 results) No results for input(s): PROBNP in the last 8760 hours.  CBG: No results for input(s): GLUCAP in the last 168 hours.     Signed:  Kayleen Memos, MD [facility] 12/02/2019, 10:34 AM

## 2019-12-02 NOTE — Care Management (Addendum)
Case manager received call from MD [facility] to setup suboxone clinic.  Patient was not established with suboxone clinic PTA, lives in [place] and will have transportation to clinics.  Case manager called several treatment centers including [facility], [facility] and [facility] treatment center.  Intake closed for all offices-intake starts as early as 5 am to 10 am. Case manager made MD and patient aware that this cannot be established today and they earliest it may be setup would be Wednesday of next week due to the holiday schedule.  Patient states that he cannot stay until next week.Case manager will continue to follow for needs.  [place]  [phone] Patient states he will be going against medical advice.  Mother wanted information on how to get patient established in a Suboxone clinic.  Information provided.

## 2019-12-04 ENCOUNTER — Encounter (HOSPITAL_COMMUNITY): Payer: Self-pay | Admitting: Internal Medicine

## 2019-12-05 LAB — AEROBIC CULTURE W GRAM STAIN (SUPERFICIAL SPECIMEN)

## 2020-03-01 ENCOUNTER — Emergency Department (HOSPITAL_COMMUNITY): Payer: Medicare Other

## 2020-03-01 ENCOUNTER — Inpatient Hospital Stay (HOSPITAL_COMMUNITY): Payer: Medicare Other

## 2020-03-01 ENCOUNTER — Encounter (HOSPITAL_COMMUNITY): Payer: Self-pay | Admitting: Emergency Medicine

## 2020-03-01 ENCOUNTER — Inpatient Hospital Stay (HOSPITAL_COMMUNITY)
Admission: EM | Admit: 2020-03-01 | Discharge: 2020-03-03 | DRG: 292 | Disposition: A | Discharge status: Home / Self Care | Payer: Medicare Other | Attending: Internal Medicine | Admitting: Internal Medicine

## 2020-03-01 ENCOUNTER — Other Ambulatory Visit: Payer: Self-pay

## 2020-03-01 DIAGNOSIS — Z825 Family history of asthma and other chronic lower respiratory diseases: Secondary | ICD-10-CM

## 2020-03-01 DIAGNOSIS — I2510 Atherosclerotic heart disease of native coronary artery without angina pectoris: Secondary | ICD-10-CM

## 2020-03-01 DIAGNOSIS — J90 Pleural effusion, not elsewhere classified: Secondary | ICD-10-CM | POA: Diagnosis not present

## 2020-03-01 DIAGNOSIS — I252 Old myocardial infarction: Secondary | ICD-10-CM

## 2020-03-01 DIAGNOSIS — N189 Chronic kidney disease, unspecified: Secondary | ICD-10-CM | POA: Diagnosis not present

## 2020-03-01 DIAGNOSIS — J449 Chronic obstructive pulmonary disease, unspecified: Secondary | ICD-10-CM | POA: Diagnosis present

## 2020-03-01 DIAGNOSIS — J811 Chronic pulmonary edema: Secondary | ICD-10-CM | POA: Diagnosis not present

## 2020-03-01 DIAGNOSIS — I5023 Acute on chronic systolic (congestive) heart failure: Secondary | ICD-10-CM | POA: Diagnosis not present

## 2020-03-01 DIAGNOSIS — I472 Ventricular tachycardia: Secondary | ICD-10-CM

## 2020-03-01 DIAGNOSIS — R079 Chest pain, unspecified: Secondary | ICD-10-CM | POA: Diagnosis not present

## 2020-03-01 DIAGNOSIS — F411 Generalized anxiety disorder: Secondary | ICD-10-CM | POA: Diagnosis present

## 2020-03-01 DIAGNOSIS — Z720 Tobacco use: Secondary | ICD-10-CM | POA: Diagnosis not present

## 2020-03-01 DIAGNOSIS — N179 Acute kidney failure, unspecified: Secondary | ICD-10-CM | POA: Diagnosis not present

## 2020-03-01 DIAGNOSIS — I255 Ischemic cardiomyopathy: Secondary | ICD-10-CM | POA: Diagnosis present

## 2020-03-01 DIAGNOSIS — Z20822 Contact with and (suspected) exposure to covid-19: Secondary | ICD-10-CM | POA: Diagnosis present

## 2020-03-01 DIAGNOSIS — R0789 Other chest pain: Secondary | ICD-10-CM | POA: Diagnosis not present

## 2020-03-01 DIAGNOSIS — Z85118 Personal history of other malignant neoplasm of bronchus and lung: Secondary | ICD-10-CM

## 2020-03-01 DIAGNOSIS — E7800 Pure hypercholesterolemia, unspecified: Secondary | ICD-10-CM | POA: Diagnosis present

## 2020-03-01 DIAGNOSIS — E876 Hypokalemia: Secondary | ICD-10-CM | POA: Diagnosis present

## 2020-03-01 DIAGNOSIS — F1190 Opioid use, unspecified, uncomplicated: Secondary | ICD-10-CM | POA: Diagnosis not present

## 2020-03-01 DIAGNOSIS — K219 Gastro-esophageal reflux disease without esophagitis: Secondary | ICD-10-CM | POA: Diagnosis present

## 2020-03-01 DIAGNOSIS — B1920 Unspecified viral hepatitis C without hepatic coma: Secondary | ICD-10-CM | POA: Diagnosis present

## 2020-03-01 DIAGNOSIS — F17210 Nicotine dependence, cigarettes, uncomplicated: Secondary | ICD-10-CM | POA: Diagnosis present

## 2020-03-01 DIAGNOSIS — G4733 Obstructive sleep apnea (adult) (pediatric): Secondary | ICD-10-CM | POA: Diagnosis present

## 2020-03-01 DIAGNOSIS — I959 Hypotension, unspecified: Secondary | ICD-10-CM | POA: Diagnosis not present

## 2020-03-01 DIAGNOSIS — E785 Hyperlipidemia, unspecified: Secondary | ICD-10-CM | POA: Diagnosis present

## 2020-03-01 DIAGNOSIS — Z833 Family history of diabetes mellitus: Secondary | ICD-10-CM

## 2020-03-01 DIAGNOSIS — I5021 Acute systolic (congestive) heart failure: Secondary | ICD-10-CM | POA: Diagnosis not present

## 2020-03-01 DIAGNOSIS — Z79899 Other long term (current) drug therapy: Secondary | ICD-10-CM | POA: Diagnosis not present

## 2020-03-01 DIAGNOSIS — N183 Chronic kidney disease, stage 3 unspecified: Secondary | ICD-10-CM | POA: Diagnosis present

## 2020-03-01 DIAGNOSIS — F1910 Other psychoactive substance abuse, uncomplicated: Secondary | ICD-10-CM | POA: Diagnosis present

## 2020-03-01 DIAGNOSIS — I5043 Acute on chronic combined systolic (congestive) and diastolic (congestive) heart failure: Secondary | ICD-10-CM | POA: Diagnosis not present

## 2020-03-01 DIAGNOSIS — Z8249 Family history of ischemic heart disease and other diseases of the circulatory system: Secondary | ICD-10-CM

## 2020-03-01 DIAGNOSIS — T502X5A Adverse effect of carbonic-anhydrase inhibitors, benzothiadiazides and other diuretics, initial encounter: Secondary | ICD-10-CM | POA: Diagnosis not present

## 2020-03-01 DIAGNOSIS — I9589 Other hypotension: Secondary | ICD-10-CM | POA: Diagnosis not present

## 2020-03-01 DIAGNOSIS — E861 Hypovolemia: Secondary | ICD-10-CM | POA: Diagnosis not present

## 2020-03-01 DIAGNOSIS — Z951 Presence of aortocoronary bypass graft: Secondary | ICD-10-CM

## 2020-03-01 DIAGNOSIS — Z8673 Personal history of transient ischemic attack (TIA), and cerebral infarction without residual deficits: Secondary | ICD-10-CM | POA: Diagnosis not present

## 2020-03-01 DIAGNOSIS — R0602 Shortness of breath: Secondary | ICD-10-CM | POA: Diagnosis not present

## 2020-03-01 LAB — CBC
HCT: 48.3 % (ref 39.0–52.0)
Hemoglobin: 15.1 g/dL (ref 13.0–17.0)
MCH: 28 pg (ref 26.0–34.0)
MCHC: 31.3 g/dL (ref 30.0–36.0)
MCV: 89.4 fL (ref 80.0–100.0)
Platelets: 236 10*3/uL (ref 150–400)
RBC: 5.4 MIL/uL (ref 4.22–5.81)
RDW: 15.6 % — ABNORMAL HIGH (ref 11.5–15.5)
WBC: 10.9 10*3/uL — ABNORMAL HIGH (ref 4.0–10.5)
nRBC: 0 % (ref 0.0–0.2)

## 2020-03-01 LAB — CBC WITH DIFFERENTIAL/PLATELET
Abs Immature Granulocytes: 0.02 10*3/uL (ref 0.00–0.07)
Basophils Absolute: 0.1 10*3/uL (ref 0.0–0.1)
Basophils Relative: 1 %
Eosinophils Absolute: 0.3 10*3/uL (ref 0.0–0.5)
Eosinophils Relative: 3 %
HCT: 49.1 % (ref 39.0–52.0)
Hemoglobin: 15 g/dL (ref 13.0–17.0)
Immature Granulocytes: 0 %
Lymphocytes Relative: 29 %
Lymphs Abs: 3.2 10*3/uL (ref 0.7–4.0)
MCH: 27.5 pg (ref 26.0–34.0)
MCHC: 30.5 g/dL (ref 30.0–36.0)
MCV: 89.9 fL (ref 80.0–100.0)
Monocytes Absolute: 0.6 10*3/uL (ref 0.1–1.0)
Monocytes Relative: 6 %
Neutro Abs: 6.6 10*3/uL (ref 1.7–7.7)
Neutrophils Relative %: 61 %
Platelets: 213 10*3/uL (ref 150–400)
RBC: 5.46 MIL/uL (ref 4.22–5.81)
RDW: 15.7 % — ABNORMAL HIGH (ref 11.5–15.5)
WBC: 10.8 10*3/uL — ABNORMAL HIGH (ref 4.0–10.5)
nRBC: 0 % (ref 0.0–0.2)

## 2020-03-01 LAB — MRSA PCR SCREENING: MRSA by PCR: NEGATIVE

## 2020-03-01 LAB — BASIC METABOLIC PANEL
Anion gap: 13 (ref 5–15)
BUN: 13 mg/dL (ref 6–20)
CO2: 23 mmol/L (ref 22–32)
Calcium: 9.3 mg/dL (ref 8.9–10.3)
Chloride: 105 mmol/L (ref 98–111)
Creatinine, Ser: 1.06 mg/dL (ref 0.61–1.24)
GFR, Estimated: >60 mL/min (ref >60)
Glucose, Bld: 133 mg/dL — ABNORMAL HIGH (ref 70–99)
Potassium: 3.4 mmol/L — ABNORMAL LOW (ref 3.5–5.1)
Sodium: 141 mmol/L (ref 135–145)

## 2020-03-01 LAB — RESP PANEL BY RT-PCR (FLU A&B, COVID) ARPGX2
Influenza A by PCR: NEGATIVE
Influenza B by PCR: NEGATIVE
SARS Coronavirus 2 by RT PCR: NEGATIVE

## 2020-03-01 LAB — TROPONIN I (HIGH SENSITIVITY)
Troponin I (High Sensitivity): 27 ng/L — ABNORMAL HIGH (ref <18)
Troponin I (High Sensitivity): 22 ng/L — ABNORMAL HIGH (ref <18)

## 2020-03-01 LAB — HEPATIC FUNCTION PANEL
ALT: 19 U/L (ref 0–44)
AST: 20 U/L (ref 15–41)
Albumin: 3.2 g/dL — ABNORMAL LOW (ref 3.5–5.0)
Alkaline Phosphatase: 104 U/L (ref 38–126)
Bilirubin, Direct: 0.1 mg/dL (ref 0.0–0.2)
Indirect Bilirubin: 0.5 mg/dL (ref 0.3–0.9)
Total Bilirubin: 0.6 mg/dL (ref 0.3–1.2)
Total Protein: 7.7 g/dL (ref 6.5–8.1)

## 2020-03-01 LAB — RAPID URINE DRUG SCREEN, HOSP PERFORMED
Amphetamines: NOT DETECTED
Barbiturates: NOT DETECTED
Benzodiazepines: NOT DETECTED
Cocaine: NOT DETECTED
Opiates: NOT DETECTED
Tetrahydrocannabinol: NOT DETECTED

## 2020-03-01 LAB — BRAIN NATRIURETIC PEPTIDE: B Natriuretic Peptide: 1716.6 pg/mL — ABNORMAL HIGH (ref 0.0–100.0)

## 2020-03-01 LAB — D-DIMER, QUANTITATIVE: D-Dimer, Quant: 1.77 ug/mL-FEU — ABNORMAL HIGH (ref 0.00–0.50)

## 2020-03-01 MED ORDER — RIVAROXABAN 20 MG PO TABS
20.0000 mg | ORAL_TABLET | Freq: Every day | ORAL | Status: DC
  Administered 2020-03-01 – 2020-03-02 (×2): 20 mg via ORAL
  Filled 2020-03-01 (×3): qty 1

## 2020-03-01 MED ORDER — SODIUM CHLORIDE 0.9% FLUSH: 3.0000 mL | INTRAVENOUS | Status: DC | PRN

## 2020-03-01 MED ORDER — SODIUM CHLORIDE 0.9 % IV SOLN: 250.0000 mL | INTRAVENOUS | Status: DC | PRN

## 2020-03-01 MED ORDER — POTASSIUM CHLORIDE 20 MEQ PO PACK
20.0000 meq | PACK | Freq: Every day | ORAL | Status: DC
  Administered 2020-03-01: 20 meq via ORAL
  Filled 2020-03-01 (×2): qty 1

## 2020-03-01 MED ORDER — MELATONIN 5 MG PO TABS
5.0000 mg | ORAL_TABLET | Freq: Every evening | ORAL | Status: DC | PRN
  Administered 2020-03-01 – 2020-03-02 (×2): 5 mg via ORAL
  Filled 2020-03-01 (×2): qty 1

## 2020-03-01 MED ORDER — FUROSEMIDE 10 MG/ML IJ SOLN
40.0000 mg | Freq: Once | INTRAMUSCULAR | Status: AC
  Administered 2020-03-01: 40 mg via INTRAVENOUS

## 2020-03-01 MED ORDER — DIGOXIN 125 MCG PO TABS
0.1250 mg | ORAL_TABLET | Freq: Every day | ORAL | Status: DC
  Administered 2020-03-01 – 2020-03-02 (×2): 0.125 mg via ORAL
  Filled 2020-03-01 (×3): qty 1

## 2020-03-01 MED ORDER — ONDANSETRON HCL 4 MG/2ML IJ SOLN
4.0000 mg | Freq: Four times a day (QID) | INTRAMUSCULAR | Status: DC | PRN
  Administered 2020-03-02: 4 mg via INTRAVENOUS
  Filled 2020-03-01: qty 2

## 2020-03-01 MED ORDER — RAMELTEON 8 MG PO TABS
8.0000 mg | ORAL_TABLET | Freq: Every evening | ORAL | Status: DC | PRN
  Filled 2020-03-01: qty 1

## 2020-03-01 MED ORDER — SODIUM CHLORIDE 0.9% FLUSH
3.0000 mL | Freq: Two times a day (BID) | INTRAVENOUS | Status: DC
  Administered 2020-03-01 – 2020-03-03 (×4): 3 mL via INTRAVENOUS

## 2020-03-01 MED ORDER — ASPIRIN 81 MG PO CHEW
324.0000 mg | CHEWABLE_TABLET | Freq: Once | ORAL | Status: AC
  Administered 2020-03-01: 324 mg via ORAL
  Filled 2020-03-01: qty 4

## 2020-03-01 MED ORDER — ASPIRIN 81 MG PO CHEW: 81.0000 mg | CHEWABLE_TABLET | Freq: Every day | ORAL | Status: DC

## 2020-03-01 MED ORDER — FUROSEMIDE 10 MG/ML IJ SOLN
80.0000 mg | Freq: Two times a day (BID) | INTRAMUSCULAR | Status: DC
  Administered 2020-03-02: 80 mg via INTRAVENOUS
  Filled 2020-03-01 (×2): qty 8

## 2020-03-01 MED ORDER — SPIRONOLACTONE 25 MG PO TABS
25.0000 mg | ORAL_TABLET | Freq: Every day | ORAL | Status: DC
  Administered 2020-03-01 – 2020-03-02 (×2): 25 mg via ORAL
  Filled 2020-03-01 (×3): qty 1

## 2020-03-01 MED ORDER — TECHNETIUM TO 99M ALBUMIN AGGREGATED
4.1500 | Freq: Once | INTRAVENOUS | Status: AC | PRN
  Administered 2020-03-01: 4.15 via INTRAVENOUS

## 2020-03-01 MED ORDER — FUROSEMIDE 10 MG/ML IJ SOLN
40.0000 mg | Freq: Once | INTRAMUSCULAR | Status: AC
  Administered 2020-03-01: 40 mg via INTRAVENOUS
  Filled 2020-03-01: qty 4

## 2020-03-01 MED ORDER — AMIODARONE HCL 200 MG PO TABS
200.0000 mg | ORAL_TABLET | Freq: Every day | ORAL | Status: DC
  Administered 2020-03-01 – 2020-03-03 (×3): 200 mg via ORAL
  Filled 2020-03-01 (×3): qty 1

## 2020-03-01 MED ORDER — SACUBITRIL-VALSARTAN 24-26 MG PO TABS
1.0000 | ORAL_TABLET | Freq: Two times a day (BID) | ORAL | Status: DC
  Administered 2020-03-01 – 2020-03-03 (×5): 1 via ORAL
  Filled 2020-03-01 (×7): qty 1

## 2020-03-01 MED ORDER — ROSUVASTATIN CALCIUM 20 MG PO TABS
20.0000 mg | ORAL_TABLET | Freq: Every day | ORAL | Status: DC
  Administered 2020-03-01 – 2020-03-03 (×3): 20 mg via ORAL
  Filled 2020-03-01 (×3): qty 1

## 2020-03-01 MED ORDER — ACETAMINOPHEN 325 MG PO TABS: 650.0000 mg | ORAL_TABLET | ORAL | Status: DC | PRN

## 2020-03-01 NOTE — ED Triage Notes (Signed)
Patient arrives to ED with complaints of centralized chest pain and SOB starting yesterday afternoon. Pt describes the pain as a burning sensation w/o radiation. Associated symptoms included dizziness and lightheadedness. Denies fever, chills, headache.

## 2020-03-01 NOTE — ED Provider Notes (Signed)
[facility] EMERGENCY DEPARTMENT Provider Note   CSN: [id number] Arrival date & time: 03/01/20  1324     History Chief Complaint  Patient presents with  . Chest Pain    John Davenport is a 44 y.o. male.  HPI      44 year old male with history of paroxysmal ventricular tachycardia with history of ICD placement in room removal in April 2020 due to MSSA bacteremia with vegetation on device, combined systolic and diastolic CHF MW10%, coronary artery disease with history of angioplasty/stents, OSA, CVA, CKD stage III, hepatitis C, IV drug use (reports clean since last hospitalization) who presents with concern for chest pain and shortness of breath.  Reports that symptoms began yesterday, and remind him of when he had blockages in the past.  Describes the chest pain as a pressure like something sitting on his chest with associated shortness of breath.  Reports he was unable to sleep last night at all due to severe orthopnea.  Reports the chest pressure is mild, but the shortness of breath is what concerned him more.  Describes dyspnea at rest that worsens with exertion and laying down.  Reports that the chest pain is not changed by positioning.  Says it feels somewhat like when he has had congestive heart failure in the past, but does feel similar to when he has had "blockages.""  Reports he has been feeling somewhat ill since he was discharged from the hospital in September.  Reports that he was sent home on no medications and has not been taking any medications.  Reports that he has been clean and not using IV drugs since he was last in the hospital.  Denies fever, leg swelling or pain, nausea, vomiting, diarrhea. Reports cough for three days. Congestion is chronic and associated with cigarette smoking pet pt. Has tried to stop but can't.  Past Medical History:  Diagnosis Date  . Anxiety state, unspecified   . Back pain   . Cancer ([place])    Lung CA  . Chronic systolic  heart failure ([place])    a. Ischemic CM (EF 30% by echo in 2011) b. echo 08/2015: EF 20% with severe HK and AK of the inferoseptal and apical myocardium.  . Coronary artery disease    a. 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  b. eventually required single vessel CABG (L-LAD) in 03/2009 due to restenosis. c. cath 2013 showed patent LIMA sequential to the diags with LAD filling retrograde, no new disease in RCA and Cx, low LVEDP, sx felt noncardiac. d. 08/2015: NSTEMI: CTO of prox LAD, 99% stenosis of small dCx (no stent), + for cocaine  . Esophageal reflux   . Heart attack ([place])   . History of hepatitis C virus infection 07/15/2018   07/20/18 Viral RNA negative suggest past, cleared infection  . Hypercholesteremia   . 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 [employer] Sci ICD 12/2008.   . Ischemic cardiomyopathy    echocardiogram 12/11: Mild LVH, EF 30-35%, anteroseptal and apical akinesis, grade 1 diastolic dysfunction  . LBP (low back pain)   . OSA (obstructive sleep apnea) 12/30/2010  . Paroxysmal ventricular tachycardia ([place])   . Stroke ([place])   . Tobacco abuse     Patient Active Problem List   Diagnosis Date Noted  . Bacteremia 11/26/2019  . Abscess 11/26/2019  . Hypokalemia 11/26/2019  . IV drug user 11/26/2019  . Pressure ulcer of  sacral region, stage 2 ([place]) 07/22/2018  . AKI (acute kidney injury) ([place])   . History of hepatitis C virus infection 07/15/2018  . Hyponatremia 07/15/2018  . Thrombocytopenia ([place]) 07/14/2018  . 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 Opioid use disorder, severe, in early remission ([place]) 07/13/2018  . Anaphylaxis 07/12/2018  . Bacteremia due to methicillin susceptible Staphylococcus aureus (MSSA) 07/12/2018  . MSSA bacteremia 07/12/2018  . Chest pain 07/11/2018  . Encounter for therapeutic drug monitoring 06/09/2016  . Embolic stroke ([place]) 30/11/2328    . Hypotension 05/23/2016  . Smokeless tobacco use 05/23/2016  . Cocaine abuse ([place]) 05/23/2016  . Cerebrovascular accident (CVA) due to thrombosis of left middle cerebral artery (HCC) s/p IV tPA and mechanical thrombectomy   . Chronic systolic congestive heart failure ([place])   . Cough with sputum 04/17/2016  . Intractable upper abdominal pain 03/09/2016  . CKD (chronic kidney disease), stage III ([place]) 03/09/2016  . 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 RUQ abdominal pain 03/09/2016  . NSTEMI (non-ST elevated myocardial infarction) ([place]) 09/05/2015  . Ischemic cardiomyopathy 09/05/2015  . Tobacco abuse 09/05/2015  . Hyperlipidemia 09/05/2015  . Renal insufficiency 09/05/2015  . OSA (obstructive sleep apnea) 12/30/2010  . Cough 12/09/2010  . Dental abscess 12/09/2010  . GERD 04/10/2010  . PAROXYSMAL VENTRICULAR TACHYCARDIA 03/22/2010  . Chronic combined systolic and diastolic CHF (congestive heart failure) ([place]) 03/22/2010  . Generalized anxiety disorder 05/16/2009  . MYOCARDIAL INFARCTION 12/19/2008  . CAD in native artery 12/19/2008    Past Surgical History:  Procedure Laterality Date  . BUBBLE STUDY  11/30/2019   Procedure: BUBBLE STUDY;  Surgeon: Elouise Munroe, MD;  Location: [facility] ENDOSCOPY;  Service: Cardiology;;  . CARDIAC CATHETERIZATION N/A 09/06/2015   Procedure: Left Heart Cath and Cors/Grafts Angiography;  Surgeon: Sherren Mocha, MD;  Location: [place] CV LAB;  Service: Cardiovascular;  Laterality: N/A;  . CARDIAC DEFIBRILLATOR PLACEMENT    . CARDIAC DEFIBRILLATOR PLACEMENT     2010 by JA  . CORONARY ANGIOPLASTY WITH STENT PLACEMENT     LAD stenting 10/09 and re-opening 6/10  . CORONARY ARTERY BYPASS GRAFT     1/11  . ICD LEAD REMOVAL Left 07/16/2018   Procedure: ICD LEAD REMOVAL;  Surgeon: Evans Lance, MD;  Location: [place];  Service: Cardiovascular;  Laterality: Left;  [place] back up  . IR GENERIC HISTORICAL  05/21/2016   IR PERCUTANEOUS ART  THROMBECTOMY/INFUSION INTRACRANIAL INC DIAG ANGIO 05/21/2016 Luanne Bras, MD [facility]-INTERV RAD  . IR GENERIC HISTORICAL  06/16/2016   IR RADIOLOGIST EVAL & MGMT 06/16/2016 [facility]  . LEFT HEART CATHETERIZATION WITH CORONARY ANGIOGRAM N/A 08/07/2011   Procedure: LEFT HEART CATHETERIZATION WITH CORONARY ANGIOGRAM;  Surgeon: Josue Hector, MD;  Location: [facility] CATH LAB;  Service: Cardiovascular;  Laterality: N/A;  . RADIOLOGY WITH ANESTHESIA N/A 05/21/2016   Procedure: RADIOLOGY WITH ANESTHESIA;  Surgeon: Luanne Bras, MD;  Location: [place];  Service: Radiology;  Laterality: N/A;  . RIGHT/LEFT HEART CATH AND CORONARY ANGIOGRAPHY N/A 02/03/2018   Procedure: RIGHT/LEFT HEART CATH AND CORONARY ANGIOGRAPHY;  Surgeon: Larey Dresser, MD;  Location: [place] CV LAB;  Service: Cardiovascular;  Laterality: N/A;  . TEE WITHOUT CARDIOVERSION N/A 07/16/2018   Procedure: TRANSESOPHAGEAL ECHOCARDIOGRAM (TEE);  Surgeon: Evans Lance, MD;  Location: [place];  Service: Cardiovascular;  Laterality: N/A;  . TEE WITHOUT CARDIOVERSION N/A 11/30/2019   Procedure: TRANSESOPHAGEAL ECHOCARDIOGRAM (TEE);  Surgeon: Elouise Munroe, MD;  Location: [facility] ENDOSCOPY;  Service:  Cardiology;  Laterality: N/A;       Family History  Problem Relation Age of Onset  . Heart attack Father 47       died at 51 after multiple MI's  . COPD Maternal Grandmother     Social History   Tobacco Use  . Smoking status: Current Every Day Smoker    Packs/day: 0.50    Years: 20.00    Pack years: 10.00    Types: Cigarettes  . Smokeless tobacco: Former Systems developer    Types: Chew  . Tobacco comment: smoke 0.5 per week  Vaping Use  . Vaping Use: Never used  Substance Use Topics  . Alcohol use: No  . Drug use: Yes    Types: Cocaine    Home Medications Prior to Admission medications   Medication Sig Start Date End Date Taking? Authorizing Provider  EPINEPHrine 0.3 mg/0.3 mL IJ SOAJ injection Inject 0.3 mLs (0.3 mg total) into the  muscle as needed for anaphylaxis. Patient not taking: Reported on 11/26/2019 07/12/18   Rehman, Areeg N, DO  nicotine (NICODERM CQ - DOSED IN MG/24 HOURS) 14 mg/24hr patch Place 1 patch (14 mg total) onto the skin daily. 12/03/19   Kayleen Memos, DO  pantoprazole (PROTONIX) 40 MG tablet Take 1 tablet (40 mg total) by mouth daily. 12/02/19 01/01/20  Kayleen Memos, DO    Allergies    Patient has no known allergies.  Review of Systems   Review of Systems  Constitutional: Positive for fatigue. Negative for fever.  HENT: Negative for sore throat.   Eyes: Negative for visual disturbance.  Respiratory: Positive for cough and shortness of breath.   Cardiovascular: Positive for chest pain. Negative for leg swelling.  Gastrointestinal: Negative for abdominal pain, nausea and vomiting.  Genitourinary: Negative for difficulty urinating.  Musculoskeletal: Negative for back pain and neck stiffness.  Skin: Negative for rash.  Neurological: Negative for syncope and headaches.    Physical Exam Updated Vital Signs BP (!) 134/100 (BP Location: Right Arm)   Pulse 82   Temp (!) 97.4 F (36.3 C) (Oral)   Resp 20   Ht 6\' 3"  (1.905 m)   Wt 81.6 kg   SpO2 99%   BMI 22.50 kg/m   Physical Exam Vitals and nursing note reviewed.  Constitutional:      General: He is not in acute distress.    Appearance: He is well-developed. He is not diaphoretic.  HENT:     Head: Normocephalic and atraumatic.  Eyes:     Conjunctiva/sclera: Conjunctivae normal.  Neck:     Vascular: JVD present.  Cardiovascular:     Rate and Rhythm: Normal rate and regular rhythm.     Heart sounds: Normal heart sounds. No murmur heard.  No friction rub. No gallop.   Pulmonary:     Effort: Pulmonary effort is normal. No respiratory distress.     Breath sounds: Rales present. No wheezing.  Abdominal:     General: There is no distension.     Palpations: Abdomen is soft.     Tenderness: There is no abdominal tenderness. There is no  guarding.  Musculoskeletal:     Cervical back: Normal range of motion.     Right lower leg: Edema (trace) present.     Left lower leg: Edema (trace) present.  Skin:    General: Skin is warm and dry.  Neurological:     Mental Status: He is alert and oriented to person, place, and time.  ED Results / Procedures / Treatments   Labs (all labs ordered are listed, but only abnormal results are displayed) Labs Reviewed  CBC - Abnormal; Notable for the following components:      Result Value   WBC 10.9 (*)    RDW 15.6 (*)    All other components within normal limits  RESP PANEL BY RT-PCR (FLU A&B, COVID) ARPGX2  BASIC METABOLIC PANEL  HEPATIC FUNCTION PANEL  DIFFERENTIAL  BRAIN NATRIURETIC PEPTIDE  TROPONIN I (HIGH SENSITIVITY)  TROPONIN I (HIGH SENSITIVITY)    EKG EKG Interpretation  Date/Time:  Thursday March 01 2020 07:19:44 EST Ventricular Rate:  87 PR Interval:  150 QRS Duration: 160 QT Interval:  364 QTC Calculation: 438 R Axis:   71 Text Interpretation: Normal sinus rhythm Non-specific intra-ventricular conduction block Minimal voltage criteria for LVH, may be normal variant ( Cornell product ) Lateral infarct , age undetermined Abnormal ECG No significant change since last tracing Confirmed by Gareth Morgan [id number]) on 03/01/2020 9:28:42 AM   Radiology DG Chest 2 View  Result Date: 03/01/2020 CLINICAL DATA:  Chest pain. EXAM: CHEST - 2 VIEW COMPARISON:  November 25, 2019. FINDINGS: Stable cardiomediastinal silhouette. Sternotomy wires are noted. No pneumothorax is noted. Interstitial densities are noted throughout both lungs concerning for pulmonary edema. Small pleural effusions may be present. Bony thorax is unremarkable. IMPRESSION: Probable bilateral pulmonary edema. Electronically Signed   By: Marijo Conception M.D.   On: 03/01/2020 07:50    Procedures Angiocath insertion  Date/Time: 03/01/2020 9:41 PM Performed by: Gareth Morgan, MD Authorized by:  Gareth Morgan, MD  Patient tolerance: patient tolerated the procedure well with no immediate complications  .Critical Care Performed by: Gareth Morgan, MD Authorized by: Gareth Morgan, MD   Critical care provider statement:    Critical care time (minutes):  45   Critical care was time spent personally by me on the following activities:  Discussions with consultants, evaluation of patient's response to treatment, examination of patient, ordering and performing treatments and interventions, ordering and review of laboratory studies, ordering and review of radiographic studies, pulse oximetry, re-evaluation of patient's condition, obtaining history from patient or surrogate and review of old charts   (including critical care time)  Medications Ordered in ED Medications  aspirin chewable tablet 324 mg (has no administration in time range)  furosemide (LASIX) injection 40 mg (has no administration in time range)    ED Course  I have reviewed the triage vital signs and the nursing notes.  Pertinent labs & imaging results that were available during my care of the patient were reviewed by me and considered in my medical decision making (see chart for details).    MDM Rules/Calculators/A&P                          44 year old male with history of paroxysmal ventricular tachycardia with history of ICD placement with removal in April 2020 due to MSSA bacteremia with vegetation on device, hospitalization 8/28-9/3 for bacteremia and abscess, combined systolic and diastolic CHF JY78%, coronary artery disease with history of angioplasty/stents, OSA, CVA, CKD stage III, hepatitis C, IV drug use (reports clean since last hospitalization) who presents with concern for chest pain and shortness of breath.  Differential diagnosis for chest pain includes pulmonary embolus, dissection, pneumothorax, pneumonia, ACS, myocarditis, pericarditis.  EKG was done and evaluate by me and showed no acute ST  changes and no signs of pericarditis. Chest x-ray was done and  evaluated by me and radiology and showed pulmonary edema.  Have low suspicion for pulmonary embolus or aortic dissection by history and exam, more likely ACS or congestive heart failure. No fevers to indicate ongoing infection.  Suspect CHF exacerbation in setting of patient not on medications with hx/exam/XR consistent with this, although he also reports symptoms similar to stenting for CAD.  Placed on O2 for comfort/tachypnea. Given aspirin, lasix and Cardiology consulted for admission.  Cardiology recommends medicine admission given complex history.    Final Clinical Impression(s) / ED Diagnoses Final diagnoses:  Acute on chronic combined systolic and diastolic congestive heart failure (HCC)  Chest pain, unspecified type    Rx / DC Orders ED Discharge Orders    None       Gareth Morgan, MD 03/01/20 2145

## 2020-03-01 NOTE — H&P (Signed)
Date: 03/01/2020               Patient Name:  John Davenport MRN: [id number]  DOB: [birth year] Age / Sex: 44 y.o., male   PCP: Patient, No Pcp Per         Medical Service: Internal Medicine Teaching Service         Attending Physician: Dr. Lucious Groves, DO    First Contact: Dr. Coy Saunas Pager: [phone]  Second Contact: Dr. 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 The chest pain felt similar to when he had an MI in the past. The shortness of breath progressed to the point that he could not sleep last night which is what prompted him to present to the ED. He endorses orthopnea, light headedness with standing. Does not currently have chest pain. No fevers, chills, syncope, recent illness, abdominal pain, n/v, changes in bowel or bladder habits, lower extremity or abdominal swelling.  He states that he has not had any medications since he was discharged from the hospital in September when he was treated for MSSA bacteremia.  He previously injected heroin, but has not used since his previous admission. He was able to wean himself off with non-prescribed suboxone. He still takes Suboxone approximately every 3-4 days to help control his cravings.     Meds: not currently on medications, but per chart review was previously on Amodarone 200 mg daily, lipitor 40 mg, Coreg 6.25 mg BID, and Digoxin 0.125 mg daily.   Allergies: Allergies as of 03/01/2020  . (No Known Allergies)   Past Medical History:  Diagnosis Date  . Anxiety state, unspecified   . Back pain   . Cancer ([place])    Lung CA  . Chronic systolic heart failure  ([place])    a. Ischemic CM (EF 30% by echo in 2011) b. echo 08/2015: EF 20% with severe HK and AK of the inferoseptal and apical myocardium.  . Coronary artery disease    a. 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  b. eventually required single vessel CABG (L-LAD) in 03/2009 due to restenosis. c. cath 2013 showed patent LIMA sequential to the diags with LAD filling retrograde, no new disease in RCA and Cx, low LVEDP, sx felt noncardiac. d. 08/2015: NSTEMI: CTO of prox LAD, 99% stenosis of small dCx (no stent), + for cocaine  . Esophageal reflux   . Heart attack ([place])   . History of hepatitis C virus infection 07/15/2018   07/20/18 Viral RNA negative suggest past, cleared infection  . Hypercholesteremia   . ICD (implantable cardioverter-defibrillator) infection ([place])    a. [place] Sci ICD 12/2008.   . Ischemic cardiomyopathy    echocardiogram 12/11: Mild LVH, EF 30-35%, anteroseptal and apical akinesis, grade 1 diastolic dysfunction  . LBP (low back pain)   . OSA (obstructive sleep apnea) 12/30/2010  . Paroxysmal ventricular tachycardia ([place])   . Stroke ([place])   . Tobacco abuse     Family History: strong family history of CAD in father and brother; father passed away  of MI at 67, but had first MI at age 78. Father also had DM   Social History: patient has disability, lives at home with mom and son. Has at least a 30-40 pack year history, no EtOH use, previous IVDU as mentioned above. Denies any further use since around Labor Day.   Review of Systems: A complete ROS was negative except as per HPI.   Physical Exam: Blood pressure (!) 140/100, pulse 88, temperature (!) 97.4 F (36.3 C), temperature source Oral, resp. rate (!) 31, height 6\' 3"  (1.905 m), weight 81.6 kg, SpO2 93 %. General: awake, alert, sitting comfortably in bed in NAD HEENT: conjunctiva clear, poor dentition, posterior oropharynx without erythema, moist mucous membranes Neck: + JVP  CV:  RRR; no m/r/g Pulm: dyspneic with conversation; bibasilar rales, R<L.  Abd: BS+; abdomen soft, non-tender, non-distended Neuro: A&Ox4; no focal deficits  Ext: warm and well perfused; no edema  Skin: no rashes, skin breakdown or track marks    EKG: personally reviewed my interpretation is NSR; no acute ischemic changes   CXR: personally reviewed my interpretation is diffuse pulmonary edema bilaterally, small right sided effusion.   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 Notable work-up in the ED includes no ACS, normal BMP and CBC, BNP elevated to 1700, and CXR with evidence of pulmonary edema. He was admitted for management of volume overload secondary to HFrEF exacerbation.   Acute on chronic HFrEF -patient appears to strictly have left-sided symptoms. He was not hypoxic on arrival, but placed on oxygen for comfort  -etiology likely due to being off GDMT  -previously had ICD, but this was removed in April 2020 due to MSSA bacteremia. Could consider replacement now that he has not injected IV drugs for several months  -HF team consulted in ED. Appreciate their recommendations. He has not had follow-up with them in clinic since 2019. plan for IV lasix 80 mg BID -start Entresto; resume home spiro and digoxin  -holding beta blocker for now given acute decompensation and he has been off of this medication for a while  -strict I&Os; daily weights -monitor renal function and electrolytes while diuresing   CAD s/p CABG -no active chest pain; EKG and trops unremarkable -will resume aspirin and statin  History of IVDU -notes he has not used since September -uses Suboxone every 3-4 days to control cravings. Patient is aware we can  administer here if needed  History of embolic CVA -resumed home Xarelto   History of paroxysmal VT -he was resumed on amiodarone 200 mg daily which he was previously on   DVT ppx: Xarelto  Diet: HH CODE: FULL   Dispo: Admit patient to Inpatient with expected length of stay greater than 2 midnights.  SignedDelice Bison, DO 03/01/2020, 2:37 PM  Pager: [phone] After 5pm on weekdays and 1pm on weekends: On Call pager: [phone]

## 2020-03-01 NOTE — ED Notes (Signed)
Unable to obtain IV access.

## 2020-03-01 NOTE — Progress Notes (Deleted)
Date: 03/01/2020               Patient Name:  John Davenport MRN: [id number]  DOB: 10-06-1975 Age / Sex: 44 y.o., male   PCP: Patient, No Pcp Per         Medical Service: Internal Medicine Teaching Service         Attending Physician: Dr. Lucious Groves, DO    First Contact: Dr. Coy Saunas Pager: [phone]  Second Contact: Dr. Marianna Payment Pager: [phone]       After Hours (After 5p/  First Contact Pager: [phone]  weekends / holidays): Second Contact Pager: [phone]   Chief Complaint: shortness of breath  History of Present Illness: Mr. 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 The chest pain felt similar to when he had an MI in the past. The shortness of breath progressed to the point that he could not sleep last night which is what prompted him to present to the ED. He endorses orthopnea, light headedness with standing. Does not currently have chest pain. No fevers, chills, syncope, recent illness, abdominal pain, n/v, changes in bowel or bladder habits, lower extremity or abdominal swelling.  He states that he has not had any medications since he was discharged from the hospital in September when he was treated for MSSA bacteremia.  He previously injected heroin, but has not used since his previous admission. He was able to wean himself off with non-prescribed suboxone. He still takes Suboxone approximately every 3-4 days to help control his cravings.     Meds: not currently on medications, but per chart review was previously on Amodarone 200 mg daily, lipitor 40 mg, Coreg 6.25 mg BID, and Digoxin 0.125 mg daily.   Allergies: Allergies as of 03/01/2020  . (No Known Allergies)   Past Medical History:  Diagnosis Date  . Anxiety state, unspecified   . Back pain   . Cancer ([place])    Lung CA  . Chronic systolic heart failure  ([place])    a. Ischemic CM (EF 30% by echo in 2011) b. echo 08/2015: EF 20% with severe HK and AK of the inferoseptal and apical myocardium.  . Coronary artery disease    a. 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  b. eventually required single vessel CABG (L-LAD) in 03/2009 due to restenosis. c. cath 2013 showed patent LIMA sequential to the diags with LAD filling retrograde, no new disease in RCA and Cx, low LVEDP, sx felt noncardiac. d. 08/2015: NSTEMI: CTO of prox LAD, 99% stenosis of small dCx (no stent), + for cocaine  . Esophageal reflux   . Heart attack ([place])   . History of hepatitis C virus infection 07/15/2018   07/20/18 Viral RNA negative suggest past, cleared infection  . Hypercholesteremia   . ICD (implantable cardioverter-defibrillator) infection ([place])    a. [place] Sci ICD 12/2008.   . Ischemic cardiomyopathy    echocardiogram 12/11: Mild LVH, EF 30-35%, anteroseptal and apical akinesis, grade 1 diastolic dysfunction  . LBP (low back pain)   . OSA (obstructive sleep apnea) 12/30/2010  . Paroxysmal ventricular tachycardia ([place])   . Stroke ([place])   . Tobacco abuse     Family History: strong family history of CAD in father and brother; father passed away  of MI at 62, but had first MI at age 31. Father also had DM   Social History: patient has disability, lives at home with mom and son. Has at least a 30-40 pack year history, no EtOH use, previous IVDU as mentioned above. Denies any further use since around Labor Day.   Review of Systems: A complete ROS was negative except as per HPI.   Physical Exam: Blood pressure (!) 140/100, pulse 88, temperature (!) 97.4 F (36.3 C), temperature source Oral, resp. rate (!) 31, height 6\' 3"  (1.905 m), weight 81.6 kg, SpO2 93 %. General: awake, alert, sitting comfortably in bed in NAD HEENT: conjunctiva clear, poor dentition, posterior oropharynx without erythema, moist mucous membranes Neck: + JVP  CV:  RRR; no m/r/g Pulm: dyspneic with conversation; bibasilar rales, R<L.  Abd: BS+; abdomen soft, non-tender, non-distended Neuro: A&Ox4; no focal deficits  Ext: warm and well perfused; no edema  Skin: no rashes, skin breakdown or track marks    EKG: personally reviewed my interpretation is NSR; no acute ischemic changes   CXR: personally reviewed my interpretation is diffuse pulmonary edema bilaterally, small right sided effusion.   Assessment & Plan by Problem: Active Problems:   Acute HFrEF (heart failure with reduced ejection fraction) [facility])  Mr. 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 Notable work-up in the ED includes no ACS, normal BMP and CBC, BNP elevated to 1700, and CXR with evidence of pulmonary edema. He was admitted for management of volume overload secondary to HFrEF exacerbation.   Acute on chronic HFrEF -patient appears to strictly have left-sided symptoms. He was not hypoxic on arrival, but placed on oxygen for comfort  -etiology likely due to being off GDMT  -previously had ICD, but this was removed in April 2020 due to MSSA bacteremia. Could consider replacement now that he has not injected IV drugs for several months  -HF team consulted in ED. Appreciate their recommendations. He has not had follow-up with them in clinic since 2019. plan for IV lasix 80 mg BID -start Entresto; resume home spiro and digoxin  -holding beta blocker for now given acute decompensation and he has been off of this medication for a while  -strict I&Os; daily weights -monitor renal function and electrolytes while diuresing   CAD s/p CABG -no active chest pain; EKG and trops unremarkable -will resume aspirin and statin  History of IVDU -notes he has not used since September -uses Suboxone every 3-4 days to control cravings. Patient is aware we can  administer here if needed  History of embolic CVA -resumed home Xarelto   History of paroxysmal VT -he was resumed on amiodarone 200 mg daily which he was previously on   DVT ppx: Xarelto  Diet: HH CODE: FULL   Dispo: Admit patient to Inpatient with expected length of stay greater than 2 midnights.  SignedDelice Bison, DO 03/01/2020, 1:08 PM  Pager: [phone] After 5pm on weekdays and 1pm on weekends: On Call pager: [phone]

## 2020-03-01 NOTE — Progress Notes (Signed)
ReDS Clip Diuretic Study Pt study # [id number]  Your patient is in the Blinded arm of the ReDS Clip Diuretic study.  Your patient has had a ReDS reading and the reading has been transmitted to the cloud.   Thank You  The research team   Kerby Nora, PharmD, BCPS Heart Failure Stewardship Pharmacist Phone [phone]  Please check [email or web address] for unit-specific pharmacist phone numbers

## 2020-03-01 NOTE — Consult Note (Addendum)
Advanced Heart Failure Team Consult Note   Primary Physician: Patient, No Pcp Per PCP-Cardiologist:  No primary care provider on file.  Reason for Consultation: heart failure  HPI:    John Davenport is 44 y.o. M PMH: recent hospitalization for MSSA bacteremia and ICD removal, paroxysmal ventricular tachycardia, CAD s/p CABG (MI with occlusion LAD 10/9 tx w BMS, reocclusion of LAD stent 6/10 and LIMA-D1 & D2 1/11 with NSTEMI 6/17 99% stenosis small distal LCX), chronic systolic CHF secondary to ICM, COPD, CVA s/p tPA and mechanical thrombectomy (2/18) thought related to cardio-embolic nature was on xarelto, CKD, and hepatitis C.    Presented to the ED today for heart burn that started 3 days ago which he notes feels similar to his "blockages."  He reports last night it was bad to the point were he could not lay flat and had to sit up all night to help with breathing.  He also mentions green productive cough which he tried nyquil for without relief.  He stopped smoking 3 days ago with symptom onset and has been smoking 1PPD since he was 44 years old.  He denies alcohol use.  States last drug use was on labor day.  He reports working on cars at home. Lives with his mother who is not having symptoms.  Denies covid vaccine.  He denies chills, nausea or vomiting but reports feeling warm last night.  He is currently not taking any medication.    In the ED, unable to obtain IV access at this time.  Labs show mild hypokalemia otherwise unremarkable (troponin, BNP, LFTs pending).  EKG shows SR and slight TWI in lateral leads similar to EKG 10/2019.   John Davenport was seen today for evaluation of volume overload at the request of Dr. Billy Fischer.   Admission 11/25/19-12/02/19: Had LUE abscess drained in ED, plan for admission with IV abx but left AMA, blood cultures returned positive for Serratia and abscess culture grew klebsiella oxytoca and serratia, patient returned and admitted to [facility] for  bacteremia.  Dr. Dellis Filbert I&D LFA switched to PO abx.  TEE was negative for vegetation.    TEE 9/21: EF 15%, global hypokinesis, mildly reduced right ventricular systolic function, mildly dilated left atrial, mod MR, mod TR, positive bubble study.   Cath 11/09: LAD occluded at the ostium, patent RCA and sequential LIMA-D1&D2, ostial LCx looks improved, no change to OM1, patent OM2, with severe disease in distal LCx.  RA 8, PA 55/20 (mean 33), PCWP 20, Fick CO 4/CI1.91, PVR 3.2.   Review of Systems: [y] = yes, [ ]  = no   . General: Weight gain [n ]; Weight loss [n ]; Anorexia [ ] ; Fatigue Blue.Reese ]; Fever [n ]; Chills [n ]; Weakness [ ]   . Cardiac: Chest pain/pressure [ y]; Resting SOB [n ]; Exertional SOB Blue.Reese ]; Orthopnea Blue.Reese ]; Pedal Edema [n ]; Palpitations [n ]; Syncope [ ] ; Presyncope [ ] ; Paroxysmal nocturnal dyspnea[ ]   . Pulmonary: Cough Blue.Reese ]; Wheezing[n ]; Hemoptysis[ ] ; Sputum Blue.Reese ]; Snoring [ ]   . GI: Vomiting[n ]; Dysphagia[ ] ; Melena[ ] ; Hematochezia [ ] ; Heartburn[y ]; Abdominal pain [n ]; Constipation [ n]; Diarrhea [ ] ; BRBPR [ ]   . GU: Hematuria[n ]; Dysuria [ ] ; Nocturia[ ]   . Vascular: Pain in legs with walking [n ]; Pain in feet with lying flat [ ] ; Non-healing sores [ ] ; Stroke [ ] ; TIA [ ] ; Slurred speech [ ] ;  . Neuro: Headaches[n ];  Vertigo[ ] ; Seizures[ ] ; Paresthesias[ ] ;Blurred vision [ ] ; Diplopia [ ] ; Vision changes [ ]   . Ortho/Skin: Arthritis [ ] ; Joint pain [ ] ; Muscle pain [ ] ; Joint swelling [ ] ; Back Pain [ ] ; Rash [ ]   . Psych: Depression[ ] ; Anxiety[ ]   . Heme: Bleeding problems [ ] ; Clotting disorders [ ] ; Anemia [ ]   . Endocrine: Diabetes [ ] ; Thyroid dysfunction[ ]   Home Medications Prior to Admission medications   Medication Sig Start Date End Date Taking? Authorizing Provider  EPINEPHrine 0.3 mg/0.3 mL IJ SOAJ injection Inject 0.3 mLs (0.3 mg total) into the muscle as needed for anaphylaxis. Patient not taking: Reported on 11/26/2019 07/12/18   Rehman, Areeg N,  DO  nicotine (NICODERM CQ - DOSED IN MG/24 HOURS) 14 mg/24hr patch Place 1 patch (14 mg total) onto the skin daily. 12/03/19   Kayleen Memos, DO  pantoprazole (PROTONIX) 40 MG tablet Take 1 tablet (40 mg total) by mouth daily. 12/02/19 01/01/20  Kayleen Memos, DO    Past Medical History: Past Medical History:  Diagnosis Date  . Anxiety state, unspecified   . Back pain   . Cancer ([place])    Lung CA  . Chronic systolic heart failure ([place])    a. Ischemic CM (EF 30% by echo in 2011) b. echo 08/2015: EF 20% with severe HK and AK of the inferoseptal and apical myocardium.  . Coronary artery disease    a. 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  b. eventually required single vessel CABG (L-LAD) in 03/2009 due to restenosis. c. cath 2013 showed patent LIMA sequential to the diags with LAD filling retrograde, no new disease in RCA and Cx, low LVEDP, sx felt noncardiac. d. 08/2015: NSTEMI: CTO of prox LAD, 99% stenosis of small dCx (no stent), + for cocaine  . Esophageal reflux   . Heart attack ([place])   . History of hepatitis C virus infection 07/15/2018   07/20/18 Viral RNA negative suggest past, cleared infection  . Hypercholesteremia   . ICD (implantable cardioverter-defibrillator) infection ([place])    a. [place] Sci ICD 12/2008.   . Ischemic cardiomyopathy    echocardiogram 12/11: Mild LVH, EF 30-35%, anteroseptal and apical akinesis, grade 1 diastolic dysfunction  . LBP (low back pain)   . OSA (obstructive sleep apnea) 12/30/2010  . Paroxysmal ventricular tachycardia ([place])   . Stroke ([place])   . Tobacco abuse     Past Surgical History: Past Surgical History:  Procedure Laterality Date  . BUBBLE STUDY  11/30/2019   Procedure: BUBBLE STUDY;  Surgeon: Elouise Munroe, MD;  Location: [facility] ENDOSCOPY;  Service: Cardiology;;  . CARDIAC CATHETERIZATION N/A 09/06/2015   Procedure: Left Heart Cath and Cors/Grafts Angiography;  Surgeon: Sherren Mocha, MD;  Location: [facility] CV LAB;  Service: Cardiovascular;  Laterality: N/A;  . CARDIAC DEFIBRILLATOR PLACEMENT    . CARDIAC DEFIBRILLATOR PLACEMENT     2010 by JA  . CORONARY ANGIOPLASTY WITH STENT PLACEMENT     LAD stenting 10/09 and re-opening 6/10  . CORONARY ARTERY BYPASS GRAFT     1/11  . ICD LEAD REMOVAL Left 07/16/2018   Procedure: ICD LEAD REMOVAL;  Surgeon: Evans Lance, MD;  Location: [facility];  Service: Cardiovascular;  Laterality: Left;  [place] back up  . IR GENERIC HISTORICAL  05/21/2016   IR PERCUTANEOUS ART THROMBECTOMY/INFUSION INTRACRANIAL INC DIAG ANGIO 05/21/2016 Luanne Bras, MD [facility]INTER[facility] RAD  .  IR GENERIC HISTORICAL  06/16/2016   IR RADIOLOGIST EVAL & MGMT 06/16/2016 [facility]  . LEFT HEART CATHETERIZATION WITH CORONARY ANGIOGRAM N/A 08/07/2011   Procedure: LEFT HEART CATHETERIZATION WITH CORONARY ANGIOGRAM;  Surgeon: Josue Hector, MD;  Location: [facility] CATH LAB;  Service: Cardiovascular;  Laterality: N/A;  . RADIOLOGY WITH ANESTHESIA N/A 05/21/2016   Procedure: RADIOLOGY WITH ANESTHESIA;  Surgeon: Luanne Bras, MD;  Location: [place];  Service: Radiology;  Laterality: N/A;  . RIGHT/LEFT HEART CATH AND CORONARY ANGIOGRAPHY N/A 02/03/2018   Procedure: RIGHT/LEFT HEART CATH AND CORONARY ANGIOGRAPHY;  Surgeon: Larey Dresser, MD;  Location: [place] CV LAB;  Service: Cardiovascular;  Laterality: N/A;  . TEE WITHOUT CARDIOVERSION N/A 07/16/2018   Procedure: TRANSESOPHAGEAL ECHOCARDIOGRAM (TEE);  Surgeon: Evans Lance, MD;  Location: [place];  Service: Cardiovascular;  Laterality: N/A;  . TEE WITHOUT CARDIOVERSION N/A 11/30/2019   Procedure: TRANSESOPHAGEAL ECHOCARDIOGRAM (TEE);  Surgeon: Elouise Munroe, MD;  Location: [place];  Service: Cardiology;  Laterality: N/A;    Family History: Family History  Problem Relation Age of Onset  . Heart attack Father 64       died at 70 after multiple MI's  . COPD Maternal Grandmother     Social History: Social History    Socioeconomic History  . Marital status: Single    Spouse name: Not on file  . Number of children: 1  . Years of education: Not on file  . Highest education level: Not on file  Occupational History  . Occupation: disabled    Fish farm manager: UNEMPLOYED  Tobacco Use  . Smoking status: Current Every Day Smoker    Packs/day: 0.50    Years: 20.00    Pack years: 10.00    Types: Cigarettes  . Smokeless tobacco: Former Systems developer    Types: Chew  . Tobacco comment: smoke 0.5 per week  Vaping Use  . Vaping Use: Never used  Substance and Sexual Activity  . Alcohol use: No  . Drug use: Yes    Types: Cocaine  . Sexual activity: Not on file  Other Topics Concern  . Not on file  Social History Narrative   Pt lives with his mom, brother, and 58 yo son in [place].   Quit tobacco in Jan. 2011 prior to heart surgery (prior -1/2 PPD (down from 1 ppd per day, onset age 3)    alcohol abuse- prior, hasn't drank since age 32   no illicit drug use    Currently unemployed, awaiting disability   Single         Social Determinants of Health   Financial Resource Strain:   . Difficulty of Paying Living Expenses: Not on file  Food Insecurity:   . Worried About Charity fundraiser in the Last Year: Not on file  . Ran Out of Food in the Last Year: Not on file  Transportation Needs:   . Lack of Transportation (Medical): Not on file  . Lack of Transportation (Non-Medical): Not on file  Physical Activity:   . Days of Exercise per Week: Not on file  . Minutes of Exercise per Session: Not on file  Stress:   . Feeling of Stress : Not on file  Social Connections:   . Frequency of Communication with Friends and Family: Not on file  . Frequency of Social Gatherings with Friends and Family: Not on file  . Attends Religious Services: Not on file  . Active Member of Clubs or Organizations: Not on  file  . Attends Archivist Meetings: Not on file  . Marital Status: Not on file    Allergies:   No Known Allergies  Objective:    Vital Signs:   Temp:  [97.4 F (36.3 C)] 97.4 F (36.3 C) (12/02 0816) Pulse Rate:  [72-88] 72 (12/02 0945) Resp:  [14-20] 18 (12/02 0945) BP: (132-136)/(99-114) 136/99 (12/02 0945) SpO2:  [99 %-100 %] 99 % (12/02 0945) Weight:  [81.6 kg] 81.6 kg (12/02 0726)    Weight change: Filed Weights   03/01/20 0726  Weight: 81.6 kg    Intake/Output:  No intake or output data in the 24 hours ending 03/01/20 1113    Physical Exam    General:  Well appearing. No resp difficulty HEENT: normal Neck: supple. + JVP . Carotids 2+ bilat; no bruits. Cor: PMI nondisplaced. Regular rate & rhythm. No rubs, gallops or murmurs. Lungs: Fine rales noted to right lower lobe.  Abdomen: soft, nontender, nondistended. No hepatosplenomegaly. No bruits or masses. Good bowel sounds. Extremities: no cyanosis, clubbing, rash, edema. Neuro: alert & oriented x 3, cranial nerves grossly intact. moves all 4 extremities w/o difficulty. Affect pleasant  Telemetry   SR rates in the 80s.  Personally reviewed.   EKG    SR with wide QRS and TWI in lateral lead.    Labs   Basic Metabolic Panel: Recent Labs  Lab 03/01/20 0732  NA 141  K 3.4*  CL 105  CO2 23  GLUCOSE 133*  BUN 13  CREATININE 1.06  CALCIUM 9.3    Liver Function Tests: No results for input(s): AST, ALT, ALKPHOS, BILITOT, PROT, ALBUMIN in the last 168 hours. No results for input(s): LIPASE, AMYLASE in the last 168 hours. No results for input(s): AMMONIA in the last 168 hours.  CBC: Recent Labs  Lab 03/01/20 0732  WBC 10.9*  HGB 15.1  HCT 48.3  MCV 89.4  PLT 236    Cardiac Enzymes: No results for input(s): CKTOTAL, CKMB, CKMBINDEX, TROPONINI in the last 168 hours.  BNP: BNP (last 3 results) No results for input(s): BNP in the last 8760 hours.  ProBNP (last 3 results) No results for input(s): PROBNP in the last 8760 hours.   CBG: No results for input(s): GLUCAP in the last 168  hours.  Coagulation Studies: No results for input(s): LABPROT, INR in the last 72 hours.   Imaging   DG Chest 2 View  Result Date: 03/01/2020 CLINICAL DATA:  Chest pain. EXAM: CHEST - 2 VIEW COMPARISON:  November 25, 2019. FINDINGS: Stable cardiomediastinal silhouette. Sternotomy wires are noted. No pneumothorax is noted. Interstitial densities are noted throughout both lungs concerning for pulmonary edema. Small pleural effusions may be present. Bony thorax is unremarkable. IMPRESSION: Probable bilateral pulmonary edema. Electronically Signed   By: Marijo Conception M.D.   On: 03/01/2020 07:50      Medications:     Current Medications: . aspirin  324 mg Oral Once  . furosemide  40 mg Intravenous Once     Infusions:     Patient Profile   John Davenport is 44 y.o. 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    Assessment/Plan   1.  Acute on chronic systolic heart failure secondary to ICM -Echo 9/21: EF 15%, global hypokinesis, mildly reduced right ventricular systolic function, mildly dilated left atrial, mod MR, mod TR, positive bubble study.  -ICD removed in April 2020 due to MSSA bacteremia.  Deemed not to be a candidate for replacement due to continued IV drug use.   -Not currently on medications, NYHA III; rales on exam -BNP pending, CXR shows bilateral pulmonary edema; hold off on repeat echo or cath for now.  -Lasix given in ED 40 mg IV will give additional 40 mg IV then 80 BID IV daily. -Start entresto 24-26 PO daily  (will need to check with RX regarding cost, was previously on losartan 25) -Resume home medication spiro 20 mg and digoxin 0.125 -Hold b-blocker for now (took coreg 6.25 BID at home)   2.  CAD s/p CABG -Cath 11/09: LAD occluded at the ostium, patent RCA and sequential LIMA-D1&D2, ostial LCx looks improved, no change to OM1, patent OM2, with severe disease in distal LCx.  RA 8, PA 55/20 (mean 33), PCWP 20, Fick CO 4/CI1.91, PVR 3.2.  -EKG SR  -Reports heart burn similar sx from previous blockages  -Initial troponin 27, trend troponins, d-dimer 2/2 pleuritic chest pain -c/w ASA, start statin  3. Hx of COPD and current tobacco use -reports productive cough, CXR no signs of infiltrates or wheezing on exam  4.  Hx of IVDA -reports last use was Labor Day -obtained UDS  5.  Hx of CVA thought to be embolic in nature -resume home Xarelto  6.  CKD -Cr stable at 1.06, baseline around 1  7.  Hx of paroxysmal VT -resume home amiodarone 200 mg daily  Medication concerns reviewed with patient and pharmacy team. Barriers identified: Has not been seen by advanced heart failure since 2019.   Length of Stay: 0  Carlene Coria, NP  03/01/2020, 11:13 AM  Advanced Heart Failure Team Pager [phone] (M-F; [place])  Please contact [facility] for night-coverage after hours (4p -7a ) and weekends on [email or web address]  Patient seen with NP, agree with the above note.   History as described above.  He was admitted in 9/21 with abscess and bacteremia.  He was sent home from the hospital without any of his prior to admission cardiac regimen, and he has been off his cardiac meds since that admission.  He has not felt good since discharge.  About 3 days ago, dyspnea worsened.  He is now short of breath with any exertion.  He has also had some pleuritic chest pain.  +Orthopnea.  He has not used IV drugs or cocaine since 9/21 admission.  He is still smoking but trying to quit.  HS Tni 27 => 22.    General:  NAD Neck: JVP 16 cm, no thyromegaly or thyroid nodule.  Lungs: Clear to auscultation bilaterally with normal respiratory effort. CV: Nondisplaced PMI.  Heart regular S1/S2, no S3/S4, no murmur.  No peripheral edema.  No carotid bruit.  Difficult to palpate pedal pulses.  Abdomen: Soft, nontender, no hepatosplenomegaly, no distention.  Skin: Intact without lesions or rashes.  Neurologic: Alert and oriented x 3.  Psych: Normal affect. Extremities: No clubbing or cyanosis.  HEENT: Normal.   1. Acute on chronic systolic CHF: Ischemic cardiomyopathy, cocaine abuse may also play a role. TEE in 9/21 with EF  15%, severe LV dilation, mild RV dysfunction, moderate MR.  He has not had any meds since 9/21 discharge. BP stable.  He is markedly volume overloaded on exam.  He is not a good candidate for advanced therapies with substance abuse.  - Lasix 80 mg IV bid.  - Start Entresto 24/26 bid.  - Start spironolactone 25 mg daily.  - Start digoxin 0.125 daily.  - No plans to replace ICD given history of IVDU and removal because of infection.  2. CAD: H/o CABG.  Last cath in 11/19 without intervention.  He has had chest pain but it is pleuritic. HS TnI minimally elevated with no trend.  - Restart Xarelto (not on ASA because of Xarelto use).  - Restart statin (Crestor 20 mg daily).  - With pleuritic chest pain and relatively sedentary, will send D dimer (if positive, V/Q scan).  3. VT: H/o VT, ICD now out with no plan to replace given IVDU.   - Restart amiodarone 200 mg daily.  4. COPD: Ongoing smoker, encouraged him to quit.  5. Substance abuse: Cocaine, heroin.  Reports none since 9/21.   Loralie Champagne 03/01/2020 2:14 PM

## 2020-03-02 ENCOUNTER — Other Ambulatory Visit (HOSPITAL_COMMUNITY): Payer: Self-pay | Admitting: Adult Health

## 2020-03-02 DIAGNOSIS — I5043 Acute on chronic combined systolic (congestive) and diastolic (congestive) heart failure: Secondary | ICD-10-CM | POA: Diagnosis not present

## 2020-03-02 DIAGNOSIS — N189 Chronic kidney disease, unspecified: Secondary | ICD-10-CM

## 2020-03-02 LAB — LIPID PANEL
Cholesterol: 116 mg/dL (ref 0–200)
HDL: 24 mg/dL — ABNORMAL LOW (ref >40)
LDL Cholesterol: 78 mg/dL (ref 0–99)
Total CHOL/HDL Ratio: 4.8 RATIO
Triglycerides: 70 mg/dL (ref <150)
VLDL: 14 mg/dL (ref 0–40)

## 2020-03-02 LAB — BASIC METABOLIC PANEL
Anion gap: 11 (ref 5–15)
BUN: 17 mg/dL (ref 6–20)
CO2: 26 mmol/L (ref 22–32)
Calcium: 8.8 mg/dL — ABNORMAL LOW (ref 8.9–10.3)
Chloride: 105 mmol/L (ref 98–111)
Creatinine, Ser: 1.13 mg/dL (ref 0.61–1.24)
GFR, Estimated: >60 mL/min (ref >60)
Glucose, Bld: 94 mg/dL (ref 70–99)
Potassium: 3.4 mmol/L — ABNORMAL LOW (ref 3.5–5.1)
Sodium: 142 mmol/L (ref 135–145)

## 2020-03-02 MED ORDER — FUROSEMIDE 40 MG PO TABS: 40.0000 mg | ORAL_TABLET | Freq: Two times a day (BID) | ORAL | 6 refills | Status: DC

## 2020-03-02 MED ORDER — DIGOXIN 125 MCG PO TABS: 0.1250 mg | ORAL_TABLET | Freq: Every day | ORAL | 6 refills | Status: DC

## 2020-03-02 MED ORDER — ASPIRIN EC 81 MG PO TBEC
81.0000 mg | DELAYED_RELEASE_TABLET | Freq: Every day | ORAL | Status: DC
  Administered 2020-03-02: 81 mg via ORAL
  Filled 2020-03-02: qty 1

## 2020-03-02 MED ORDER — RIVAROXABAN 20 MG PO TABS: 20.0000 mg | ORAL_TABLET | Freq: Every day | ORAL | 6 refills | Status: DC

## 2020-03-02 MED ORDER — POTASSIUM CHLORIDE CRYS ER 20 MEQ PO TBCR
40.0000 meq | EXTENDED_RELEASE_TABLET | Freq: Once | ORAL | Status: AC
  Administered 2020-03-02: 40 meq via ORAL
  Filled 2020-03-02: qty 2

## 2020-03-02 MED ORDER — FUROSEMIDE 40 MG PO TABS: 40.0000 mg | ORAL_TABLET | Freq: Two times a day (BID) | ORAL | Status: DC

## 2020-03-02 MED ORDER — FUROSEMIDE 40 MG PO TABS
40.0000 mg | ORAL_TABLET | Freq: Every day | ORAL | Status: DC
Start: 2020-03-03 — End: 2020-03-02

## 2020-03-02 MED ORDER — SPIRONOLACTONE 25 MG PO TABS: 25.0000 mg | ORAL_TABLET | Freq: Every day | ORAL | 6 refills | Status: DC

## 2020-03-02 MED ORDER — POTASSIUM CHLORIDE 20 MEQ PO PACK
40.0000 meq | PACK | Freq: Every day | ORAL | Status: DC
  Administered 2020-03-02 – 2020-03-03 (×2): 40 meq via ORAL
  Filled 2020-03-02 (×2): qty 2

## 2020-03-02 MED ORDER — DAPAGLIFLOZIN PROPANEDIOL 10 MG PO TABS: 10.0000 mg | ORAL_TABLET | Freq: Every day | ORAL | 6 refills | Status: DC

## 2020-03-02 MED ORDER — GUAIFENESIN 200 MG PO TABS
200.0000 mg | ORAL_TABLET | ORAL | Status: DC | PRN
  Filled 2020-03-02: qty 1

## 2020-03-02 MED ORDER — AMIODARONE HCL 200 MG PO TABS: 200.0000 mg | ORAL_TABLET | Freq: Every day | ORAL | 6 refills | Status: DC

## 2020-03-02 MED ORDER — BUPRENORPHINE HCL-NALOXONE HCL 8-2 MG SL SUBL
1.0000 | SUBLINGUAL_TABLET | Freq: Every day | SUBLINGUAL | Status: DC | PRN
  Administered 2020-03-02 – 2020-03-03 (×2): 1 via SUBLINGUAL
  Filled 2020-03-02 (×2): qty 1

## 2020-03-02 MED ORDER — ROSUVASTATIN CALCIUM 20 MG PO TABS: 20.0000 mg | ORAL_TABLET | Freq: Every day | ORAL | 6 refills | Status: DC

## 2020-03-02 MED ORDER — DAPAGLIFLOZIN PROPANEDIOL 10 MG PO TABS
10.0000 mg | ORAL_TABLET | Freq: Every day | ORAL | Status: DC
  Administered 2020-03-02 – 2020-03-03 (×2): 10 mg via ORAL
  Filled 2020-03-02 (×2): qty 1

## 2020-03-02 MED ORDER — SACUBITRIL-VALSARTAN 24-26 MG PO TABS: 1.0000 | ORAL_TABLET | Freq: Two times a day (BID) | ORAL | 6 refills | Status: DC

## 2020-03-02 MED FILL — FARXIGA 10 MG TABLET: 10 | 30 days supply | Qty: 30 | Fill #0

## 2020-03-02 MED FILL — DIGOXIN 0.125 MG TABLET: 125 | 30 days supply | Qty: 30 | Fill #0

## 2020-03-02 MED FILL — AMIODARONE HCL 200 MG TAB: 200 | 30 days supply | Qty: 30 | Fill #0

## 2020-03-02 MED FILL — FUROSEMIDE 40 MG TABLET: 40 | 30 days supply | Qty: 90 | Fill #0

## 2020-03-02 MED FILL — ROSUVASTATIN CALCIUM 20 MG: 20 | 30 days supply | Qty: 30 | Fill #0

## 2020-03-02 MED FILL — SPIRONOLACTONE 25 MG TABLET: 25 | 30 days supply | Qty: 30 | Fill #0

## 2020-03-02 MED FILL — ENTRESTO 24 MG-26 MG TABLET: 24-26 | 30 days supply | Qty: 60 | Fill #0

## 2020-03-02 MED FILL — XARELTO 20 MG TABLET: 20 | 30 days supply | Qty: 30 | Fill #0

## 2020-03-02 NOTE — Progress Notes (Addendum)
Advanced Heart Failure Rounding Note  PCP-Cardiologist: No primary care provider on file.   Subjective:    Presented 12/2 for productive cough, shortness of breath and heart burn x 3 days, found to have pulmonary edema.   Today: Reports feeling better, able to sleep last night and lay flat.  Continues to have productive cough.  -3 lbs and -5.5L.    Objective:   Weight Range: 80.4 kg Body mass index is 22.15 kg/m.   Vital Signs:   Temp:  [97.4 F (36.3 C)-97.8 F (36.6 C)] 97.6 F (36.4 C) (12/03 0347) Pulse Rate:  [71-95] 71 (12/03 0347) Resp:  [13-31] 14 (12/03 0347) BP: (108-149)/(63-118) 108/90 (12/03 0347) SpO2:  [93 %-100 %] 94 % (12/03 0347) Weight:  [80.4 kg] 80.4 kg (12/03 0531) Last BM Date: 02/29/20  Weight change: Filed Weights   03/01/20 0726 03/01/20 2033 03/02/20 0531  Weight: 81.6 kg 80.4 kg 80.4 kg    Intake/Output:   Intake/Output Summary (Last 24 hours) at 03/02/2020 0801 Last data filed at 03/02/2020 0348 Gross per 24 hour  Intake 840 ml  Output 5450 ml  Net -4610 ml      Physical Exam    General:  Well appearing. No resp difficulty HEENT: Normal Neck: Supple. + JVP . Carotids 2+ bilat; no bruits.  Cor: PMI nondisplaced. Regular rate & rhythm. No rubs, gallops or murmurs. Lungs: Clear bilaterally.  Removed off oxygen.  Abdomen: Soft, nontender, nondistended. Good bowel sounds. Extremities: No cyanosis, clubbing, rash, edema Neuro: Alert & oriented x 3,  moves all 4 extremities w/o difficulty. Affect pleasant   Telemetry   NSR rates in 60-70s. Personally reviewed.    EKG    No new EKG to review.   Labs    CBC Recent Labs    03/01/20 0732  WBC 10.8*  10.9*  NEUTROABS 6.6  HGB 15.0  15.1  HCT 49.1  48.3  MCV 89.9  89.4  PLT 213  557   Basic Metabolic Panel Recent Labs    03/01/20 0732 03/02/20 0030  NA 141 142  K 3.4* 3.4*  CL 105 105  CO2 23 26  GLUCOSE 133* 94  BUN 13 17  CREATININE 1.06 1.13    CALCIUM 9.3 8.8*   Liver Function Tests Recent Labs    03/01/20 1206  AST 20  ALT 19  ALKPHOS 104  BILITOT 0.6  PROT 7.7  ALBUMIN 3.2*   No results for input(s): LIPASE, AMYLASE in the last 72 hours. Cardiac Enzymes No results for input(s): CKTOTAL, CKMB, CKMBINDEX, TROPONINI in the last 72 hours.  BNP: BNP (last 3 results) Recent Labs    03/01/20 1207  BNP 1,716.6*    ProBNP (last 3 results) No results for input(s): PROBNP in the last 8760 hours.   D-Dimer Recent Labs    03/01/20 1217  DDIMER 1.77*   Hemoglobin A1C No results for input(s): HGBA1C in the last 72 hours. Fasting Lipid Panel Recent Labs    03/02/20 0030  CHOL 116  HDL 24*  LDLCALC 78  TRIG 70  CHOLHDL 4.8   Thyroid Function Tests No results for input(s): TSH, T4TOTAL, T3FREE, THYROIDAB in the last 72 hours.  Invalid input(s): FREET3  Other results:   Imaging    NM Pulmonary Perf and Vent  Result Date: 03/01/2020 CLINICAL DATA:  Chest pain and increasing shortness of breath over the past 3 days. EXAM: NUCLEAR MEDICINE PERFUSION LUNG SCAN TECHNIQUE: Perfusion images were obtained in multiple  projections after intravenous injection of radiopharmaceutical. Ventilation scans intentionally deferred if perfusion scan and chest x-ray adequate for interpretation during COVID 19 epidemic. RADIOPHARMACEUTICALS:  4.1 mCi Tc-54m MAA IV COMPARISON:  PA and lateral chest today. FINDINGS: No segmental or subsegmental defect is identified. IMPRESSION: Normal perfusion exam.  Negative for pulmonary embolus. Electronically Signed   By: Inge Rise M.D.   On: 03/01/2020 18:52      Medications:     Scheduled Medications: . amiodarone  200 mg Oral Daily  . aspirin EC  81 mg Oral Daily  . digoxin  0.125 mg Oral Daily  . furosemide  80 mg Intravenous BID  . potassium chloride  20 mEq Oral Daily  . rivaroxaban  20 mg Oral Q supper  . rosuvastatin  20 mg Oral Daily  . sacubitril-valsartan  1  tablet Oral BID  . sodium chloride flush  3 mL Intravenous Q12H  . spironolactone  25 mg Oral Daily     Infusions: . sodium chloride       PRN Medications:  sodium chloride, acetaminophen, melatonin, ondansetron (ZOFRAN) IV, ramelteon, sodium chloride flush    Patient Profile   John Davenport is 44 y.o. 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    Assessment/Plan   1.  Acute on chronic systolic heart failure secondary to ICM -Echo 9/21: EF 15%, global hypokinesis, mildly reduced right ventricular systolic function, mildly dilated left atrial, mod MR, mod TR, positive bubble study.  -ICD removed in April 2020 due to MSSA bacteremia.  Deemed not to be a candidate for replacement due to continued IV drug use.   -On admission: not currently on medications, NYHA III; rales on exam. -Wt on previous admission in Sept was 164 lbs. Today, weight 177 lbs.  -BNP 1716, CXR shows bilateral pulmonary edema.  -Hold off on repeat echo or cath for now.  -c/w entresto 24-26 PO daily (will need to check with RX regarding cost, was previously on losartan 25) -c/w home medication spiro 20 mg and digoxin 0.125  - c/w lasix 80 mg IV BID for now - Monitor electrolytes and replete PRN -Hold b-blocker for now (took coreg 6.25 BID at home)  2.  CAD s/p CABG -Cath 11/09: LAD occluded at the ostium, patent RCA and sequential LIMA-D1&D2, ostial LCx looks improved, no change to OM1, patent OM2, with severe disease in distal LCx.  RA 8, PA 55/20 (mean 33),  PCWP 20, Fick CO 4/CI1.91, PVR 3.2.  -EKG SR  -Reports heart burn similar sx from previous blockages has resolved. -Troponin 27 -->22; d-dimer elevated but VQ scan negative for PE -c/w statin  3. Hx of COPD and current tobacco use -reports productive cough, CXR no signs of infiltrates or wheezing on exam  4.  Hx of IVDA -reports last use was Labor Day -UDS unremarkable  5.  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  CKD -Cr stable at 1.1.13, baseline around 1  7.  Hx of paroxysmal VT -c/w home amiodarone 200 mg daily  Medication concerns reviewed with patient and pharmacy team. Length of Stay: [place], NP  03/02/2020, 8:01 AM  Advanced Heart Failure Team Pager [phone] (M-F; 7a - 4p)  Please contact [facility] for night-coverage after hours (4p -7a ) and weekends on [email or web address]  Patient seen with NP, agree with the above note.   Breathing better, good diuresis overnight.  Creatinine stable at 1.13. UDS negative. SBP 100s.    General: NAD Neck: JVP 8-9 cm, no thyromegaly or thyroid nodule.  Lungs: Clear to auscultation bilaterally with normal respiratory effort. CV: Nondisplaced PMI.  Heart regular S1/S2, no S3/S4, no murmur.  No peripheral edema.  No carotid bruit.  Normal pedal pulses.  Abdomen: Soft, nontender, no hepatosplenomegaly, no distention.  Skin: Intact without lesions or rashes.  Neurologic: Alert and oriented x 3.  Psych: Normal affect. Extremities: No clubbing or cyanosis.  HEENT: Normal.   Meds restarted, feels better.  Volume status much improved with good diuresis. - IV Lasix today, to Lasix 40 mg po bid tomorrow.  - Continue spironolactone 25 daily, Entresto 24/26 bid, digoxin 0.125 daily.  - Start Farxiga 10 mg daily.  - If BP stable tomorrow, can restart Coreg 3.125 mg bid.   Congratulated on abstinence from substances, will need his home suboxone.   He has been restarted on low dose amiodarone for  history of VT.  Suspect home over weekend, will need help with meds.    Loralie Champagne 03/02/2020 2:40 PM

## 2020-03-02 NOTE — Progress Notes (Signed)
Subjective:   Hospital day: 2  Overnight event: NAEOV. Patient states he feels better this morning. He was able to sleep last night and lay flat. He continues to have productive cough. He denies any CP, palpitations, dizziness or lower extremity edema. He informed team that he he is starting to have cravings and would like some suboxone to help decrease his cravings for heroin.   Objective:  Vital signs in last 24 hours: Vitals:   03/01/20 2033 03/01/20 2329 03/02/20 0347 03/02/20 0531  BP: (!) 123/95 112/81 108/90   Pulse: 83 71 71   Resp: 19 19 14    Temp: 97.6 F (36.4 C) 97.8 F (36.6 C) 97.6 F (36.4 C)   TempSrc: Oral Oral Oral   SpO2: 94% 96% 94%   Weight: 80.4 kg   80.4 kg  Height: 6\' 3"  (1.905 m)       Intake/Output Summary (Last 24 hours) at 03/02/2020 1236 Last data filed at 03/02/2020 1226 Gross per 24 hour  Intake 1780 ml  Output 8800 ml  Net -7020 ml    Physical Exam  General: Pleasant, well-appearing middle-aged man laying in bed. No acute distress. Head: Normocephalic. Atraumatic. Neck: +JVD to the mid-neck. CV: RRR. No murmurs, rubs, or gallops. No LE edema Pulmonary: On RA. Lungs CTAB. Normal effort. No wheezing or rales. Abdominal: Soft, nontender, nondistended. Normal bowel sounds. Extremities: Palpable pulses. Normal ROM. Skin: Warm and dry. No rash or lesions. Skin breakdown or track marks.  Neuro: A&Ox3. Moves all extremities. Normal sensation. No focal deficit. Psych: Normal mood and affect  Assessment/Plan: John Davenport is a 44 y.o. 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   Active Problems:   Acute HFrEF (heart failure with reduced ejection fraction) (HCC)  Acute on chronic systolic heart failure secondary to ICM Echo on 9/21 showed EF 15%,  global hypokinesis, mildly reduced RVSF, mildly dilated LA, mod MR, mod TR, positive bubble study. Patient appears to strictly have left-sided symptoms. He was not hypoxic on arrival, but placed on oxygen for comfort. Etiology likely due to being off GDMT. Patient previously had ICD, but this was removed in April 2020 due to MSSA bacteremia. He has not had follow-up with HF in clinic since 2019. Net I/O over the last 24 h is -4.6 L. Respiratory status improved today. On RA with O2 sats in the mid-upper 90s.  --HF following, appreciate recs --Hold off on repeat echo or cath for now, per HF --Continue IV lasix 80 mg BID --Continue entresto 24-26 PO daily  --Continue spiro 20 mg  --Continue digoxin 0.125 --Consider ICD replacement --Holding b-blocker --strict I&Os --Daily weights --Daily BMP --Monitor electrolytes  CAD s/p CABG Occluded LAD with severe disease in distal LCx found on left heart cath on 11/09. No active chest pain on arrival. EKG and trops unremarkable. --S/p ASA 324 mg x1 dose --Continue ASA 81 mg daily --Continue Crestor 20 mg daily  History of IVDU States he has not used since September during labor day. Uses Suboxone every 3-4 days to control cravings at home. Patient requesting suboxone here today due to cravings.  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 He was resumed on amiodarone 200 mg daily which he was previously on. EKG with NSR.  --Continue Amio 200 mg daily --Tele  Hx of COPD and current tobacco use Patient reports productive cough, CXR no signs of infiltrates or wheezing on exam. Satting well on RA. --Monitor closely  CKD Cr stable at 1.13 today with baseline around 1.  --Daily BMP  DVT ppx: Xarelto  Diet:  HH CODE: FULL   Prior to Admission Living Arrangement: Home Anticipated Discharge Location: Home Barriers to Discharge: Medical workup Dispo: Anticipated discharge in approximately 2-3 day(s).   Signed: Lacinda Axon, MD 03/02/2020, 6:18 AM  Pager: [phone] Internal Medicine Teaching Service After 5pm on weekdays and 1pm on weekends: On Call pager: [phone]

## 2020-03-02 NOTE — Progress Notes (Addendum)
HF Meds sent to [facility] today - meds filled and will be down in the pharmacy at discharge.   Lasix 40 mg twice a day  Amio 200 mg daily  Entresto 24-26 mg twice a day - 30 day free card  Spiro 25 mg daily Farxiga 10 mg daily - 30 day free card Xarelto 20 mg daily  HF follow up set up.   Montez Stryker NP-C  2:53 PM

## 2020-03-02 NOTE — Plan of Care (Signed)

## 2020-03-02 NOTE — Progress Notes (Signed)
ReDS Clip Diuretic Study Pt study # [id number]  Your patient is in the Blinded arm of the ReDS Clip Diuretic study.  Your patient has had a ReDS reading and the reading has been transmitted to the cloud.   Thank You  The research team  Antonietta Jewel, PharmD, [place] Clinical Pharmacist  Phone: [phone] 03/02/2020 9:10 AM  Please check AMION for all [place] phone numbers After 10:00 PM, call [place] [phone]

## 2020-03-02 NOTE — Plan of Care (Signed)
  Problem: Education: Goal: Ability to demonstrate management of disease process will improve Outcome: Progressing   Problem: Education: Goal: Ability to verbalize understanding of medication therapies will improve Outcome: Progressing   Problem: Activity: Goal: Capacity to carry out activities will improve Outcome: Progressing   Problem: Cardiac: Goal: Ability to achieve and maintain adequate cardiopulmonary perfusion will improve Outcome: Progressing   

## 2020-03-02 NOTE — Progress Notes (Signed)
Paged by RN, patients BP 76/55 with MAP 63. Went to examine patient. He is asymptomatic. Rechecked BP after having patient sit up on side of bed. BP 89/60s with MAP 69. I/Os showing UOP of 5.45L in the setting of diuresis for HF. Patient may have had too much fluid taken off of him. Next dose of lasix and spironolactone not until tomorrow morning. Will hold off on giving fluid at this time as patient is asymptomatic and repeat MAP 69.

## 2020-03-03 DIAGNOSIS — I5021 Acute systolic (congestive) heart failure: Secondary | ICD-10-CM

## 2020-03-03 DIAGNOSIS — I9589 Other hypotension: Secondary | ICD-10-CM

## 2020-03-03 DIAGNOSIS — E861 Hypovolemia: Secondary | ICD-10-CM

## 2020-03-03 DIAGNOSIS — F1190 Opioid use, unspecified, uncomplicated: Secondary | ICD-10-CM

## 2020-03-03 LAB — BASIC METABOLIC PANEL
Anion gap: 10 (ref 5–15)
BUN: 25 mg/dL — ABNORMAL HIGH (ref 6–20)
CO2: 29 mmol/L (ref 22–32)
Calcium: 8.6 mg/dL — ABNORMAL LOW (ref 8.9–10.3)
Chloride: 100 mmol/L (ref 98–111)
Creatinine, Ser: 1.55 mg/dL — ABNORMAL HIGH (ref 0.61–1.24)
GFR, Estimated: 56 mL/min — ABNORMAL LOW (ref >60)
Glucose, Bld: 90 mg/dL (ref 70–99)
Potassium: 3.8 mmol/L (ref 3.5–5.1)
Sodium: 139 mmol/L (ref 135–145)

## 2020-03-03 MED ORDER — SODIUM CHLORIDE 0.9 % IV BOLUS
500.0000 mL | Freq: Once | INTRAVENOUS | Status: AC
  Administered 2020-03-03: 500 mL via INTRAVENOUS

## 2020-03-03 MED ORDER — SODIUM CHLORIDE 0.9 % IV BOLUS: 250.0000 mL | Freq: Once | INTRAVENOUS | Status: AC

## 2020-03-03 NOTE — Discharge Instructions (Signed)
Mr. Scheier,  It was a pleasure taking care of you at [facility]. I commend you for staying drug-free for the last few months. With the help of the heart failure team, we were able to get a lot of fluid off you. Your breathing is much better so we are discharging you home. Your blood pressure is still a little low so follow the following instructions:  1) DO NOT take your lasix for 2 days, then start taking it once daily 2) Make sure to follow up with the heart failure team 3) We started you back on your medications plus a few new ones so make sure you are taking them as prescribed.   Staff from the [facility] will call you to set up an appointment for the [facility]. If you do not hear from anyone by next Friday, call the clinic.   Take care,  Dr. Linwood Dibbles, MD, MPH

## 2020-03-03 NOTE — Progress Notes (Signed)
Advanced Heart Failure Rounding Note  PCP-Cardiologist: No primary care provider on file.   Subjective:    Still with cough but feels great and wants to go home. SBP low overnight but better this am.   Denies dizziness, CP or SOB.   Out 4L overnight.   Objective:   Weight Range: 80.6 kg Body mass index is 22.21 kg/m.   Vital Signs:   Temp:  [97.6 F (36.4 C)-98 F (36.7 C)] 97.8 F (36.6 C) (12/04 1100) Pulse Rate:  [50-66] 52 (12/04 1150) Resp:  [12-19] 12 (12/04 1150) BP: (86-93)/(51-65) 88/59 (12/04 1100) SpO2:  [91 %-93 %] 93 % (12/04 1150) Weight:  [80.6 kg] 80.6 kg (12/04 0632) Last BM Date: 03/02/20  Weight change: Filed Weights   03/01/20 2033 03/02/20 0531 03/03/20 1324  Weight: 80.4 kg 80.4 kg 80.6 kg    Intake/Output:   Intake/Output Summary (Last 24 hours) at 03/03/2020 1315 Last data filed at 03/03/2020 1200 Gross per 24 hour  Intake 960 ml  Output 900 ml  Net 60 ml      Physical Exam    General:  Well appearing. No resp difficulty HEENT: normal Neck: supple. no JVD. Carotids 2+ bilat; no bruits. No lymphadenopathy or thryomegaly appreciated. Cor: PMI nondisplaced. Regular rate & rhythm. No rubs, gallops or murmurs. Lungs: carse Abdomen: soft, nontender, nondistended. No hepatosplenomegaly. No bruits or masses. Good bowel sounds. Extremities: no cyanosis, clubbing, rash, edema Neuro: alert & orientedx3, cranial nerves grossly intact. moves all 4 extremities w/o difficulty. Affect pleasant   Telemetry   NSR 50-60s. Personally reviewed     Labs    CBC Recent Labs    03/01/20 0732  WBC 10.8*  10.9*  NEUTROABS 6.6  HGB 15.0  15.1  HCT 49.1  48.3  MCV 89.9  89.4  PLT 213  401   Basic Metabolic Panel Recent Labs    03/02/20 0030 03/03/20 0057  NA 142 139  K 3.4* 3.8  CL 105 100  CO2 26 29  GLUCOSE 94 90  BUN 17 25*  CREATININE 1.13 1.55*  CALCIUM 8.8* 8.6*   Liver Function Tests Recent Labs    03/01/20 1206   AST 20  ALT 19  ALKPHOS 104  BILITOT 0.6  PROT 7.7  ALBUMIN 3.2*   No results for input(s): LIPASE, AMYLASE in the last 72 hours. Cardiac Enzymes No results for input(s): CKTOTAL, CKMB, CKMBINDEX, TROPONINI in the last 72 hours.  BNP: BNP (last 3 results) Recent Labs    03/01/20 1207  BNP 1,716.6*    ProBNP (last 3 results) No results for input(s): PROBNP in the last 8760 hours.   D-Dimer Recent Labs    03/01/20 1217  DDIMER 1.77*   Hemoglobin A1C No results for input(s): HGBA1C in the last 72 hours. Fasting Lipid Panel Recent Labs    03/02/20 0030  CHOL 116  HDL 24*  LDLCALC 78  TRIG 70  CHOLHDL 4.8   Thyroid Function Tests No results for input(s): TSH, T4TOTAL, T3FREE, THYROIDAB in the last 72 hours.  Invalid input(s): FREET3  Other results:   Imaging    No results found.   Medications:     Scheduled Medications: . amiodarone  200 mg Oral Daily  . dapagliflozin propanediol  10 mg Oral Daily  . digoxin  0.125 mg Oral Daily  . furosemide  40 mg Oral BID  . potassium chloride  40 mEq Oral Daily  . rivaroxaban  20 mg Oral Q  supper  . rosuvastatin  20 mg Oral Daily  . sacubitril-valsartan  1 tablet Oral BID  . sodium chloride flush  3 mL Intravenous Q12H  . spironolactone  25 mg Oral Daily    Infusions: . sodium chloride      PRN Medications: sodium chloride, acetaminophen, buprenorphine-naloxone, guaiFENesin, melatonin, ondansetron (ZOFRAN) IV, ramelteon, sodium chloride flush    Patient Profile   John Davenport is 44 y.o. 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    Assessment/Plan   1.  Acute on chronic systolic heart failure secondary to ICM -Echo 9/21: EF 15%, global hypokinesis, mildly reduced right ventricular systolic function, mildly dilated left atrial, mod MR, mod TR, positive bubble study.  -ICD removed in April 2020 due to MSSA bacteremia.  Deemed not to be a candidate for replacement due to continued IV drug use.   - much improved. Likely a bit dry but otherwise stable for d/c.  - can send home on the following meds. Can give 250 MS prior to d/c to support BP if needed. Lasix 40 mg daily (Start on Monday. Hold for weight under 175 pounds) Amio 200 mg daily  Entresto 24-26 mg twice a day - 30 day free card  Spiro 25 mg daily Farxiga 10 mg daily - 30 day free card Xarelto 20 mg daily  2.  CAD s/p CABG -Cath 11/09: LAD occluded at the ostium, patent RCA and sequential LIMA-D1&D2, ostial LCx looks improved, no change to OM1, patent OM2, with severe disease in distal LCx.  RA 8, PA 55/20 (mean 33), PCWP 20, Fick CO 4/CI1.91, PVR 3.2.  -Reports heart burn similar sx from previous blockages has resolved. -Troponin 27 -->22; d-dimer elevated but VQ scan negative for PE -c/w statin  3. Hx of COPD and current tobacco use -reports productive cough, CXR no signs of infiltrates or wheezing on exam  4.  Hx of IVDA -reports last use was Labor Day -UDS unremarkable  5.  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  AKI on CKD -Cr up to 1.5 today. Likely overdiuresed. Can give back 250-500cc prio to d/c. We will recheck as outpatient.   7.  Hx of paroxysmal VT -c/w home amiodarone 200 mg daily  Medication concerns reviewed with patient and pharmacy team. Length of Stay: 2  Glori Bickers, MD  03/03/2020, 1:15 PM  Advanced Heart Failure Team Pager [phone] (M-F; [place])  Please contact [facility] for night-coverage after  hours (4p -7a ) and weekends on [email or web address]

## 2020-03-03 NOTE — Hospital Course (Addendum)
Acute on chronic systolic heart failure secondary to ICM Echo on 9/2 showed EF 15%. Patient appeared to strictly have left-sided symptoms. He was not hypoxic on arrival, but placed on oxygen for comfort. He had JVD but no lower extremity swelling on exam. Trops were normal but BNP elevated to 1,700. Etiology likely due to him being off GDMT. Patient was instructed to discontinue meds on discharge after last hospitalization on 11/25/19. Patient previously had ICD, but this was removed in April 2020 due to MSSA bacteremia. He was diuresed with IV lasix 40 mg on arrival and put on IV lasix 80 mg BID during admission. He was diuresed to a net of -7.4 L by day of discharge and given 500 mL IVNS before he was discharged. He was started on Entresto 24-26 PO daily, spiro 20 mg, digoxin 0.125 and Farxiga 10 mg daily. His coreg was held until discharge. At discharge, received all his medications through transition of care before he left. An appointment with heart failure was schedule for 03/19/20.    Hypotension The night before day of discharge, patient started having soft BPs to systolic in the 03Y.  His lasix was switched from IV 80 BID to po 40 mg BID. Patient was asymptomatic. Orthostatics were negative. Patient was given a 500 mL IVNS bolus before discharge. His BP improved to 111/69 before discharge.    CAD s/p CABG Occluded LAD with severe disease in distal LCx found on left heart cath on 11/09. No active chest pain on arrival. EKG and trops unremarkable. He was given ASA 324 mg on arrival. He was put on Crestor 20 mg daily.    History of IVDU (Heroin) Patient reported that he has not used since September during labor day. States he uses Suboxone every 3-4 days to control cravings at home. Patient requested PRN suboxone for cravings.Urine drug screen was negative. He was started on Suboxone 8-2 mg daily and he required one dose before discharge. Patient agreed to come to the suboxone clinic at [facility] to establish care.  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 We resumed home Xarelto and continued at discharge.    History of paroxysmal VT EKG on arrival showed NSR. He was resumed on amiodarone 200 mg daily which he was previously on. EKG showed NSR. Amiodarone was continued at discharge.   Hx of COPD and current tobacco use Patient reported productive cough on admission but CXR did not show any signs of infiltrates and there was no wheezing on exam. He maintained great O2 levels throughout his stay without supplemental O2.   CKD Baseline Cr around 1. Cr increased to 1.55 from 1.13 on day of discharge. Likely due to over-diureses. Follow up with a BMP.

## 2020-03-03 NOTE — Discharge Summary (Signed)
Name: John Davenport MRN: [id number] DOB: [birth year] 44 y.o. PCP: Patient, No Pcp Per  Date of Admission: 03/01/2020  7:19 AM Date of Discharge: 03/03/2020 Attending Physician: Dr. Philipp Ovens  Discharge Diagnosis: Active Problems:   Acute HFrEF (heart failure with reduced ejection fraction) (HCC)   Acute on chronic combined systolic and diastolic congestive heart failure [facility])    Discharge Medications: Allergies as of 03/03/2020   No Known Allergies     Medication List    STOP taking these medications   EPINEPHrine 0.3 mg/0.3 mL Soaj injection Commonly known as: EPI-PEN   nicotine 14 mg/24hr patch Commonly known as: NICODERM CQ - dosed in mg/24 hours     TAKE these medications   amiodarone 200 MG tablet Commonly known as: PACERONE Take 1 tablet (200 mg total) by mouth daily.   dapagliflozin propanediol 10 MG Tabs tablet Commonly known as: FARXIGA Take 1 tablet (10 mg total) by mouth daily.   digoxin 0.125 MG tablet Commonly known as: LANOXIN Take 1 tablet (0.125 mg total) by mouth daily.   furosemide 40 MG tablet Commonly known as: LASIX Take 1 tablet (40 mg total) by mouth 2 (two) times daily. With extra 40 mg as needed for 3 pound weight gain.   pantoprazole 40 MG tablet Commonly known as: PROTONIX Take 1 tablet (40 mg total) by mouth daily.   rivaroxaban 20 MG Tabs tablet Commonly known as: XARELTO Take 1 tablet (20 mg total) by mouth daily with supper.   rosuvastatin 20 MG tablet Commonly known as: CRESTOR Take 1 tablet (20 mg total) by mouth daily.   sacubitril-valsartan 24-26 MG Commonly known as: ENTRESTO Take 1 tablet by mouth 2 (two) times daily.   spironolactone 25 MG tablet Commonly known as: ALDACTONE Take 1 tablet (25 mg total) by mouth daily.       Disposition and follow-up:   John Davenport was discharged from [facility] in Stable condition.  At the hospital follow up visit please address:  1.   Follow-up:  A.  Shortness of breath: Symptoms were resolved after diuresing 7.4L during admission. Please ensure that he is symptom-free and adjust lasix as needed.    B. Medication Adherence: Patient was started on GDMT for heart failure and CAD. Please ensure he is taking them and have access to refills.    c.  2.  Labs / imaging needed at time of follow-up: BMP, BNP, CBC  3.  Pending labs/ test needing follow-up: None  Follow-up Appointments:  Follow-up Information    [facility] Follow up on 03/19/2020.   Specialty: Cardiology Why: at 1000. Located on the 1st floor of [place]. Entrance C.  Contact information: [address] [id number] mc [address] [place]       Larey Dresser, MD .   Specialty: Cardiology Contact information: [place] [address] [place] Hospital Course by problem list: Acute on chronic systolic heart failure secondary to ICM Echo on 9/2 showed EF 15%. Patient appeared to strictly have left-sided symptoms. He was not hypoxic on arrival, but placed on oxygen for comfort. He had JVD but no lower extremity swelling on exam. Trops were normal but BNP elevated to 1,700. Etiology likely due to him being off GDMT. Patient was instructed to discontinue meds on discharge after last hospitalization on 11/25/19. Patient previously had  ICD, but this was removed in April 2020 due to MSSA bacteremia. He was diuresed with IV lasix 40 mg on arrival and put on IV lasix 80 mg BID during admission. He was diuresed to a net of -7.4 L by day of discharge and given 500 mL IVNS before he was discharged. He was started on Entresto 24-26 PO daily, spiro 20 mg, digoxin 0.125 and Farxiga 10 mg daily. His coreg was held until discharge. At discharge, received all his medications through transition of care before he left. An appointment with heart failure was schedule  for 03/19/20.    Hypotension The night before day of discharge, patient started having soft BPs to systolic in the 01U.  His lasix was switched from IV 80 BID to po 40 mg BID. Patient was asymptomatic. Orthostatics were negative. Patient was given a 500 mL IVNS bolus before discharge. His BP improved to 111/69 before discharge.    CAD s/p CABG Occluded LAD with severe disease in distal LCx found on left heart cath on 11/09. No active chest pain on arrival. EKG and trops unremarkable. He was given ASA 324 mg on arrival. He was put on Crestor 20 mg daily.    History of IVDU (Heroin) Patient reported that he has not used since September during labor day. States he uses Suboxone every 3-4 days to control cravings at home. Patient requested PRN suboxone for cravings.Urine drug screen was negative. He was started on Suboxone 8-2 mg daily and he required one dose before discharge. Patient agreed to come to the suboxone clinic at the [facility] to establish care.  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 We resumed home Xarelto and continued at discharge.    History of paroxysmal VT EKG on arrival showed NSR. He was resumed on amiodarone 200 mg daily which he was previously on. EKG showed NSR. Amiodarone was continued at discharge.   Hx of COPD and current tobacco use Patient reported productive cough on admission but CXR did not show any signs of infiltrates and there was no wheezing on exam. He maintained great O2 levels throughout his stay without supplemental O2.   CKD Baseline Cr around 1. Cr increased to 1.55 from 1.13 on day of discharge. Likely due to over-diureses. Follow up with a BMP.    Discharge Vitals:   BP (!) 91/54   Pulse 60   Temp 97.8 F (36.6 C) (Oral)   Resp 12   Ht 6\' 3"  (1.905 m)   Wt 80.6 kg   SpO2 94%   BMI 22.21 kg/m   Pertinent Labs, Studies, and  Procedures:  CBC Latest Ref Rng & Units 03/01/2020 03/01/2020 11/30/2019  WBC 4.0 - 10.5 K/uL 10.8(H) 10.9(H) 11.1(H)  Hemoglobin 13.0 - 17.0 g/dL 15.0 15.1 11.1(L)  Hematocrit 39 - 52 % 49.1 48.3 36.2(L)  Platelets 150 - 400 K/uL 213 236 274    CMP Latest Ref Rng & Units 03/03/2020 03/02/2020 03/01/2020  Glucose 70 - 99 mg/dL 90 94 133(H)  BUN 6 - 20 mg/dL 25(H) 17 13  Creatinine 0.61 - 1.24 mg/dL 1.55(H) 1.13 1.06  Sodium 135 - 145 mmol/L 139 142 141  Potassium 3.5 - 5.1 mmol/L 3.8 3.4(L) 3.4(L)  Chloride 98 - 111 mmol/L 100 105 105  CO2 22 - 32 mmol/L 29 26 23   Calcium 8.9 - 10.3 mg/dL 8.6(L) 8.8(L) 9.3  Total Protein 6.5 -  8.1 g/dL - - 7.7  Total Bilirubin 0.3 - 1.2 mg/dL - - 0.6  Alkaline Phos 38 - 126 U/L - - 104  AST 15 - 41 U/L - - 20  ALT 0 - 44 U/L - - 19    DG Chest 2 View  Result Date: 03/01/2020 CLINICAL DATA:  Chest pain. EXAM: CHEST - 2 VIEW COMPARISON:  November 25, 2019. FINDINGS: Stable cardiomediastinal silhouette. Sternotomy wires are noted. No pneumothorax is noted. Interstitial densities are noted throughout both lungs concerning for pulmonary edema. Small pleural effusions may be present. Bony thorax is unremarkable. IMPRESSION: Probable bilateral pulmonary edema. Electronically Signed   By: Marijo Conception M.D.   On: 03/01/2020 07:50   NM Pulmonary Perf and Vent  Result Date: 03/01/2020 CLINICAL DATA:  Chest pain and increasing shortness of breath over the past 3 days. EXAM: NUCLEAR MEDICINE PERFUSION LUNG SCAN TECHNIQUE: Perfusion images were obtained in multiple projections after intravenous injection of radiopharmaceutical. Ventilation scans intentionally deferred if perfusion scan and chest x-ray adequate for interpretation during COVID 19 epidemic. RADIOPHARMACEUTICALS:  4.1 mCi Tc-51m MAA IV COMPARISON:  PA and lateral chest today. FINDINGS: No segmental or subsegmental defect is identified. IMPRESSION: Normal perfusion exam.  Negative for pulmonary embolus.  Electronically Signed   By: Inge Rise M.D.   On: 03/01/2020 18:52     Discharge Instructions: Discharge Instructions    Diet - low sodium heart healthy   Complete by: As directed    Increase activity slowly   Complete by: As directed      John Davenport,  It was a pleasure taking care of you at [facility]. I commend you for staying drug-free for the last few months. With the help of the heart failure team, we were able to get a lot of fluid off you. Your breathing is much better so we are discharging you home. Your blood pressure is still a little low so follow the following instructions:  1) DO NOT take your lasix for 2 days, then start taking it once daily 2) Make sure to follow up with the heart failure team 3) We started you back on your medications plus a few new ones so make sure you are taking them as prescribed.   Staff from the [facility] will call you to set up an appointment for the [place] clinic. If you do not hear from anyone by next Friday, call the clinic.   Signed: Lacinda Axon, MD 03/03/2020, 3:25 PM   Pager: [phone]

## 2020-03-03 NOTE — Progress Notes (Signed)
Subjective:   Hospital day: 2  Overnight event: Patient had BP dropped to 76/55 with MAP of 63 last night. Repeat showed 89/60s w/ MAP of 69. Patient was asymptomatic so no fluid was given.  This AM, patient was laying down comfortably in bed.  States he is feeling much better today.  Denies any shortness of breath, chest pain, palpitations, nausea, vomiting, abdominal pain or leg swelling.  States he wants to continue to stay drug-free. He usually gets Suboxone on the street and takes it every 2 or 3 days whenever he has cravings.  States he is willing to come see [facility] for Suboxone treatment but does not have a reliable transportation.  Objective:  Vital signs in last 24 hours: Vitals:   03/03/20 1000 03/03/20 1023 03/03/20 1100 03/03/20 1105  BP: (!) 88/51 93/64 (!) 88/59   Pulse: 66 61 (!) 51 (!) 59  Resp: 13 19 12    Temp:   97.8 F (36.6 C)   TempSrc:   Oral   SpO2: 92% 92% 92%   Weight:      Height:        Intake/Output Summary (Last 24 hours) at 03/03/2020 5573 Last data filed at 03/03/2020 2202 Gross per 24 hour  Intake 1420 ml  Output 4250 ml  Net -2830 ml    Physical Exam  General: Pleasant, well-appearing middle-aged man laying in bed. No acute distress. Head: Normocephalic. Atraumatic. CV: RRR. No murmurs, rubs, or gallops. No LE edema Pulmonary: On RA. Lungs CTAB. Normal effort. No wheezing or rales. Abdominal: Soft, nontender, nondistended. Normal bowel sounds. Extremities: Palpable pulses. Normal ROM. Skin: Warm and dry. No rash or lesions. Skin breakdown or track marks.  Neuro: A&Ox3. Moves all extremities. Normal sensation. No focal deficit. Psych: Normal mood and affect  Assessment/Plan: John Davenport is a 44 y.o. 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 Net  I/O of -7.4 L since admission.  Active Problems:   Acute HFrEF (heart failure with reduced ejection fraction) (HCC)   Acute on chronic combined systolic and diastolic congestive heart failure (HCC)  Acute on chronic systolic heart failure secondary to ICM Echo on 9/21 showed EF 15%, global hypokinesis, mildly reduced RVSF, mildly dilated LA, mod MR, mod TR, positive bubble study. Patient appears to strictly have left-sided symptoms. He was not hypoxic on arrival, but placed on oxygen for comfort. Etiology likely due to being off GDMT. Patient was instructed to discontinue meds on discharge after last hospitalization on 11/25/19. Patient previously had ICD, but this was removed in April 2020 due to MSSA bacteremia. He has not had follow-up with HF in clinic since 2019. Net I/O over the last 24 h is -2.8 L and -7.4 L since admission. Respiratory status improved today. On RA with O2 sats in the mid-upper 90s. Likely discharge tomorrow.  --HF following, appreciate recs --Hold off on repeat echo or cath for now, per HF --Change IV lasix 80 mg BID to Lasix 40 mg po BID today --Continue entresto 24-26 PO daily  --Continue spiro 20 mg  --Continue digoxin 0.125 --Start Farxiga 10 mg daily --Holding b-blocker --strict I&Os --Daily weights --Daily BMP --Monitor electrolytes --Cardiology f/u appt scheduled for 03/19/20  Hypotension Overnight, patient started having soft Bps to systolic in the 54Y.  Current BP still with systolic  in the 80s. Patient is asymptomatic. Hypotension likely secondary to overdiuresis. Continue to monitor. --negative orthostatics --Daily vitals --On a reduced diuretic regimen --Consider soft fluids if BP persistently low.  CAD s/p CABG Occluded LAD with severe disease in distal LCx found on left heart cath on 11/09. No active chest pain on arrival. EKG and trops unremarkable. --S/p ASA 324 mg x1 dose --Continue ASA 81 mg daily --Continue Crestor 20 mg daily  History  of IVDU States he has not used since September during labor day. Uses Suboxone every 3-4 days to control cravings at home. Patient requested PRN suboxone for cravings. Given first dose yesterday.  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 He was resumed on amiodarone 200 mg daily which he was previously on. EKG with NSR.  --Continue Amio 200 mg daily --Tele  Hx of COPD and current tobacco use Patient reported productive cough yesterday, CXR no signs of infiltrates or wheezing on exam. Satting well on RA. --Monitor closely  CKD Cr increased to 1.55 from 1.13 this morning. Likely due to over-diureses. Will follow HF recs today. Baseline around 1.  --Daily BMP  DVT ppx: Xarelto  Diet: HH CODE: FULL   Prior to Admission Living Arrangement: Home Anticipated Discharge Location: Home Barriers to Discharge: Medical workup Dispo: Anticipated discharge tomorrow.  Signed: Lacinda Axon, MD 03/03/2020, 6:08 AM  Pager: [phone] Internal Medicine Teaching Service After 5pm on weekdays and 1pm on weekends: On Call pager: [phone]

## 2020-03-05 ENCOUNTER — Telehealth: Payer: Self-pay | Admitting: General Practice

## 2020-03-05 NOTE — Telephone Encounter (Signed)
Please refer to message below.  Unable to contact patient via telephone.  Left detailed message asking to call the clinic back to schedule an appointment.

## 2020-03-05 NOTE — Telephone Encounter (Signed)
-----   Message from Gaylyn Rong, RN sent at 03/05/2020  1:31 PM EST ----- Regarding: FW: Suboxone treatment  ----- Message ----- From: Meriam Sprague Sent: 03/05/2020   9:33 AM EST To: Imp Oud Nurse Subject: FW: Suboxone treatment                         Can I just schedule this appointment? Or do you need to handle since this will his first ov? Just let me know.  Thanks,  Best boy ----- Message ----- From: Lacinda Axon, MD Sent: 03/03/2020   3:59 PM EST To: Imp Front Desk Pool Subject: Suboxone treatment                             Please call this patient to schedule a Suboxone clinic appt for him. Call his mother's number first but you also call this number, [phone] if you do not reach him. Give him our schedule and let him choose when he wants to come in. Thanks. ~Dr. Coy Saunas

## 2020-03-06 NOTE — Telephone Encounter (Signed)
Agree with plan.  He will need to call back to schedule.

## 2020-03-19 ENCOUNTER — Other Ambulatory Visit: Payer: Self-pay

## 2020-03-19 ENCOUNTER — Telehealth (HOSPITAL_COMMUNITY): Payer: Self-pay | Admitting: Pharmacy Technician

## 2020-03-19 ENCOUNTER — Other Ambulatory Visit (HOSPITAL_COMMUNITY): Payer: Self-pay | Admitting: *Deleted

## 2020-03-19 ENCOUNTER — Ambulatory Visit (HOSPITAL_COMMUNITY)
Admit: 2020-03-19 | Discharge: 2020-03-19 | Disposition: A | Discharge status: Home / Self Care | Payer: Medicare Other | Source: Ambulatory Visit | Attending: Cardiology | Admitting: Cardiology

## 2020-03-19 ENCOUNTER — Encounter (HOSPITAL_COMMUNITY): Payer: Self-pay

## 2020-03-19 VITALS — BP 120/80 | HR 65 | Wt 183.4 lb

## 2020-03-19 DIAGNOSIS — Z8619 Personal history of other infectious and parasitic diseases: Secondary | ICD-10-CM | POA: Diagnosis not present

## 2020-03-19 DIAGNOSIS — I252 Old myocardial infarction: Secondary | ICD-10-CM | POA: Insufficient documentation

## 2020-03-19 DIAGNOSIS — I5043 Acute on chronic combined systolic (congestive) and diastolic (congestive) heart failure: Secondary | ICD-10-CM | POA: Insufficient documentation

## 2020-03-19 DIAGNOSIS — I6340 Cerebral infarction due to embolism of unspecified cerebral artery: Secondary | ICD-10-CM | POA: Diagnosis not present

## 2020-03-19 DIAGNOSIS — F17210 Nicotine dependence, cigarettes, uncomplicated: Secondary | ICD-10-CM | POA: Insufficient documentation

## 2020-03-19 DIAGNOSIS — F1911 Other psychoactive substance abuse, in remission: Secondary | ICD-10-CM

## 2020-03-19 DIAGNOSIS — I472 Ventricular tachycardia: Secondary | ICD-10-CM

## 2020-03-19 DIAGNOSIS — J449 Chronic obstructive pulmonary disease, unspecified: Secondary | ICD-10-CM | POA: Diagnosis not present

## 2020-03-19 DIAGNOSIS — R7989 Other specified abnormal findings of blood chemistry: Secondary | ICD-10-CM | POA: Insufficient documentation

## 2020-03-19 DIAGNOSIS — Z720 Tobacco use: Secondary | ICD-10-CM | POA: Diagnosis not present

## 2020-03-19 DIAGNOSIS — N189 Chronic kidney disease, unspecified: Secondary | ICD-10-CM | POA: Diagnosis not present

## 2020-03-19 DIAGNOSIS — Z8673 Personal history of transient ischemic attack (TIA), and cerebral infarction without residual deficits: Secondary | ICD-10-CM | POA: Insufficient documentation

## 2020-03-19 DIAGNOSIS — Z79899 Other long term (current) drug therapy: Secondary | ICD-10-CM | POA: Diagnosis not present

## 2020-03-19 DIAGNOSIS — Z7901 Long term (current) use of anticoagulants: Secondary | ICD-10-CM | POA: Insufficient documentation

## 2020-03-19 DIAGNOSIS — I4729 Other ventricular tachycardia: Secondary | ICD-10-CM

## 2020-03-19 DIAGNOSIS — Z955 Presence of coronary angioplasty implant and graft: Secondary | ICD-10-CM | POA: Diagnosis not present

## 2020-03-19 DIAGNOSIS — I2510 Atherosclerotic heart disease of native coronary artery without angina pectoris: Secondary | ICD-10-CM | POA: Insufficient documentation

## 2020-03-19 LAB — BRAIN NATRIURETIC PEPTIDE: B Natriuretic Peptide: 663.9 pg/mL — ABNORMAL HIGH (ref 0.0–100.0)

## 2020-03-19 LAB — CBC
HCT: 42.1 % (ref 39.0–52.0)
Hemoglobin: 12.9 g/dL — ABNORMAL LOW (ref 13.0–17.0)
MCH: 26.9 pg (ref 26.0–34.0)
MCHC: 30.6 g/dL (ref 30.0–36.0)
MCV: 87.7 fL (ref 80.0–100.0)
Platelets: 178 10*3/uL (ref 150–400)
RBC: 4.8 MIL/uL (ref 4.22–5.81)
RDW: 15.3 % (ref 11.5–15.5)
WBC: 8.8 10*3/uL (ref 4.0–10.5)
nRBC: 0 % (ref 0.0–0.2)

## 2020-03-19 LAB — BASIC METABOLIC PANEL
Anion gap: 10 (ref 5–15)
BUN: 21 mg/dL — ABNORMAL HIGH (ref 6–20)
CO2: 23 mmol/L (ref 22–32)
Calcium: 9 mg/dL (ref 8.9–10.3)
Chloride: 104 mmol/L (ref 98–111)
Creatinine, Ser: 1.11 mg/dL (ref 0.61–1.24)
GFR, Estimated: >60 mL/min (ref >60)
Glucose, Bld: 81 mg/dL (ref 70–99)
Potassium: 3.9 mmol/L (ref 3.5–5.1)
Sodium: 137 mmol/L (ref 135–145)

## 2020-03-19 LAB — DIGOXIN LEVEL: Digoxin Level: <0.2 ng/mL — ABNORMAL LOW (ref 0.8–2.0)

## 2020-03-19 MED ORDER — DAPAGLIFLOZIN PROPANEDIOL 10 MG PO TABS: 10.0000 mg | ORAL_TABLET | Freq: Every day | ORAL | 3 refills | Status: DC

## 2020-03-19 MED ORDER — AMIODARONE HCL 200 MG PO TABS: 200.0000 mg | ORAL_TABLET | Freq: Every day | ORAL | 3 refills | Status: DC

## 2020-03-19 MED ORDER — SPIRONOLACTONE 25 MG PO TABS: 25.0000 mg | ORAL_TABLET | Freq: Every day | ORAL | 3 refills | Status: DC

## 2020-03-19 MED ORDER — FUROSEMIDE 40 MG PO TABS: 40.0000 mg | ORAL_TABLET | Freq: Two times a day (BID) | ORAL | 3 refills | Status: DC

## 2020-03-19 MED ORDER — RIVAROXABAN 20 MG PO TABS: 20.0000 mg | ORAL_TABLET | Freq: Every day | ORAL | 6 refills | Status: DC

## 2020-03-19 MED ORDER — CARVEDILOL 3.125 MG PO TABS: 3.1250 mg | ORAL_TABLET | Freq: Two times a day (BID) | ORAL | 3 refills | Status: DC

## 2020-03-19 MED ORDER — SACUBITRIL-VALSARTAN 24-26 MG PO TABS: 1.0000 | ORAL_TABLET | Freq: Two times a day (BID) | ORAL | 3 refills | Status: DC

## 2020-03-19 MED ORDER — CARVEDILOL 3.125 MG PO TABS: 3.1250 mg | ORAL_TABLET | Freq: Two times a day (BID) | ORAL | 11 refills | Status: DC

## 2020-03-19 MED ORDER — DIGOXIN 125 MCG PO TABS: 0.1250 mg | ORAL_TABLET | Freq: Every day | ORAL | 3 refills | Status: DC

## 2020-03-19 NOTE — Patient Instructions (Addendum)
START Carvedilol 3.125mg  twice daily  Routine lab work today. Will notify you of abnormal results  Follow up in 3 weeks with Pharm D   Follow up 6 weeks with Clinic   You have been referred to Internal Medicine Clinic. They will call you to schedule your appointment

## 2020-03-19 NOTE — Progress Notes (Addendum)
Advanced Heart Failure Clinic Note   PCP: Patient, No Pcp Per HF Cardiologist: Dr. Aundra Dubin  HPI: John Davenport is 44 y.o. M PMH: recent hospitalization for MSSA bacteremia and ICD removal, paroxysmal ventricular tachycardia, CAD s/p CABG (MI with occlusion LAD 10/9 tx w BMS, reocclusion of LAD stent 6/10 and LIMA-D1 & D2 1/11 with NSTEMI 6/17 99% stenosis small distal LCX), chronic systolic CHF secondary to ICM, COPD, CVA s/p tPA and mechanical thrombectomy (2/18) thought related to cardio-embolic nature was on xarelto, CKD, and hepatitis C.    Recent admission 12/2-12/4 for heart burn which he notes feels similar to his "blockages." He could not lay flat and had to sit up all night to help with breathing. He also had green productive cough. In the ED, labs show mild hypokalemia otherwise unremarkable (troponin & LFTS were normal; BNP 1,700 likely from being off GDMT). EKG showed SR and slight TWI in lateral leads similar to EKG 10/2019. CXR suggestive of bilateral pulmonary edema, VQ scan negative for PE. Patient appeared to strictly have left-sided symptoms. Patient was instructed to discontinue meds on discharge after last hospitalization on 11/25/19.Patient previously had ICD, but this was removed in April 2020 due to MSSA bacteremia. He was diuresed with IV lasix 40 mg on arrival and put on IV lasix 80 mg BID during admission. He was diuresed to a net of -7.4 L by day of discharge and given 500 mL IV NS before he was discharged. He was started on Entresto 24-26 PO daily, spiro 20 mg, digoxin 0.125 and Farxiga 10 mg daily. His coreg was held until discharge. Baseline Cr around 1. Crincreased to 1.55 from 1.13 on day of discharge. Likely due to over-diureses.  Discharge weight was 177.3 lbs.  Here today for follow up. Feels good, no SOB. No complaints of swelling, CP, dizziness, orthopnea/PND.  No fever/chills.  Smoking 1 ppd, down from 2 ppd. No ETOH. No IVDU since labor day. Weights a home  ~182-183 lbs. Buying Suboxone on the street, wants to be seen in internal medicine clinic to obtain medication legally. Lives with mom and does not have reliable transportation. Taking all medications as directed.   Admission 11/25/19-12/02/19: Had LUE abscess drained in ED, plan for admission with IV abx but left AMA, blood cultures returned positive for Serratia and abscess culture grew klebsiella oxytoca and serratia, patient returned and admitted to [facility] for bacteremia.  Dr. Dellis Filbert I&D LFA switched to PO abx.  TEE was negative for vegetation.    TEE 9/21: EF 15%, global hypokinesis, mildly reduced right ventricular systolic function, mildly dilated left atrial, mod MR, mod TR, positive bubble study.   Cath 11/19: LAD occluded at the ostium, patent RCA and sequential LIMA-D1&D2, ostial LCx looks improved, no change to OM1, patent OM2, with severe disease in distal LCx.  RA 8, PA 55/20 (mean 33), PCWP 20, Fick CO 4/CI1.91, PVR 3.2.        Past Medical History:  Diagnosis Date  . Anxiety state, unspecified   . Back pain   . Cancer ([place])    Lung CA  . Chronic systolic heart failure ([place])    a. Ischemic CM (EF 30% by echo in 2011) b. echo 08/2015: EF 20% with severe HK and AK of the inferoseptal and apical myocardium.  . Coronary artery disease    a. 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  b. eventually required single vessel CABG (L-LAD) in  03/2009 due to restenosis. c. cath 2013 showed patent LIMA sequential to the diags with LAD filling retrograde, no new disease in RCA and Cx, low LVEDP, sx felt noncardiac. d. 08/2015: NSTEMI: CTO of prox LAD, 99% stenosis of small dCx (no stent), + for cocaine  . Esophageal reflux   . Heart attack ([place])   . History of hepatitis C virus infection 07/15/2018   07/20/18 Viral RNA negative suggest past, cleared infection  . Hypercholesteremia   . ICD (implantable cardioverter-defibrillator) infection ([place])    a.  [employer] ICD 12/2008.   . Ischemic cardiomyopathy    echocardiogram 12/11: Mild LVH, EF 30-35%, anteroseptal and apical akinesis, grade 1 diastolic dysfunction  . LBP (low back pain)   . OSA (obstructive sleep apnea) 12/30/2010  . Paroxysmal ventricular tachycardia ([place])   . Stroke ([place])   . Tobacco abuse    Past Surgical History:      Past Surgical History:  Procedure Laterality Date  . BUBBLE STUDY  11/30/2019   Procedure: BUBBLE STUDY;  Surgeon: Elouise Munroe, MD;  Location: [facility] ENDOSCOPY;  Service: Cardiology;;  . CARDIAC CATHETERIZATION N/A 09/06/2015   Procedure: Left Heart Cath and Cors/Grafts Angiography;  Surgeon: Sherren Mocha, MD;  Location: [place] CV LAB;  Service: Cardiovascular;  Laterality: N/A;  . CARDIAC DEFIBRILLATOR PLACEMENT    . CARDIAC DEFIBRILLATOR PLACEMENT     2010 by JA  . CORONARY ANGIOPLASTY WITH STENT PLACEMENT     LAD stenting 10/09 and re-opening 6/10  . CORONARY ARTERY BYPASS GRAFT     1/11  . ICD LEAD REMOVAL Left 07/16/2018   Procedure: ICD LEAD REMOVAL;  Surgeon: Evans Lance, MD;  Location: [place];  Service: Cardiovascular;  Laterality: Left;  [place] back up  . IR GENERIC HISTORICAL  05/21/2016   IR PERCUTANEOUS ART THROMBECTOMY/INFUSION INTRACRANIAL INC DIAG ANGIO 05/21/2016 Luanne Bras, MD [facility]-INTERV RAD  . IR GENERIC HISTORICAL  06/16/2016   IR RADIOLOGIST EVAL & MGMT 06/16/2016 [facility]  . LEFT HEART CATHETERIZATION WITH CORONARY ANGIOGRAM N/A 08/07/2011   Procedure: LEFT HEART CATHETERIZATION WITH CORONARY ANGIOGRAM;  Surgeon: Josue Hector, MD;  Location: [facility] CATH LAB;  Service: Cardiovascular;  Laterality: N/A;  . RADIOLOGY WITH ANESTHESIA N/A 05/21/2016   Procedure: RADIOLOGY WITH ANESTHESIA;  Surgeon: Luanne Bras, MD;  Location: [place];  Service: Radiology;  Laterality: N/A;  . RIGHT/LEFT HEART CATH AND CORONARY ANGIOGRAPHY N/A 02/03/2018   Procedure: RIGHT/LEFT HEART CATH AND CORONARY ANGIOGRAPHY;   Surgeon: Larey Dresser, MD;  Location: [place] CV LAB;  Service: Cardiovascular;  Laterality: N/A;  . TEE WITHOUT CARDIOVERSION N/A 07/16/2018   Procedure: TRANSESOPHAGEAL ECHOCARDIOGRAM (TEE);  Surgeon: Evans Lance, MD;  Location: [place];  Service: Cardiovascular;  Laterality: N/A;  . TEE WITHOUT CARDIOVERSION N/A 11/30/2019   Procedure: TRANSESOPHAGEAL ECHOCARDIOGRAM (TEE);  Surgeon: Elouise Munroe, MD;  Location: [place];  Service: Cardiology;  Laterality: N/A;    Family History:      Family History  Problem Relation Age of Onset  . Heart attack Father 34       died at 63 after multiple MI's  . COPD Maternal Grandmother      Current Outpatient Medications  Medication Sig Dispense Refill  . amiodarone (PACERONE) 200 MG tablet Take 1 tablet (200 mg total) by mouth daily. 30 tablet 6  . dapagliflozin propanediol (FARXIGA) 10 MG TABS tablet Take 1 tablet (10 mg total) by mouth daily. 30 tablet 6  . digoxin (  LANOXIN) 0.125 MG tablet Take 1 tablet (0.125 mg total) by mouth daily. 30 tablet 6  . furosemide (LASIX) 40 MG tablet Take 1 tablet (40 mg total) by mouth 2 (two) times daily. With extra 40 mg as needed for 3 pound weight gain. 90 tablet 6  . pantoprazole (PROTONIX) 40 MG tablet Take 1 tablet (40 mg total) by mouth daily. 30 tablet 0  . rivaroxaban (XARELTO) 20 MG TABS tablet Take 1 tablet (20 mg total) by mouth daily with supper. 30 tablet 6  . rosuvastatin (CRESTOR) 20 MG tablet Take 1 tablet (20 mg total) by mouth daily. 30 tablet 6  . sacubitril-valsartan (ENTRESTO) 24-26 MG Take 1 tablet by mouth 2 (two) times daily. 60 tablet 6  . spironolactone (ALDACTONE) 25 MG tablet Take 1 tablet (25 mg total) by mouth daily. 30 tablet 6  . carvedilol (COREG) 3.125 MG tablet Take 1 tablet (3.125 mg total) by mouth 2 (two) times daily. 60 tablet 11   No current facility-administered medications for this encounter.    No Known Allergies    Social History    Socioeconomic History  . Marital status: Single    Spouse name: Not on file  . Number of children: 1  . Years of education: Not on file  . Highest education level: Not on file  Occupational History  . Occupation: disabled    Fish farm manager: UNEMPLOYED  Tobacco Use  . Smoking status: Current Every Day Smoker    Packs/day: 0.50    Years: 20.00    Pack years: 10.00    Types: Cigarettes  . Smokeless tobacco: Former Systems developer    Types: Chew  . Tobacco comment: smoke 0.5 per week  Vaping Use  . Vaping Use: Never used  Substance and Sexual Activity  . Alcohol use: No  . Drug use: Yes    Types: Cocaine  . Sexual activity: Not on file  Other Topics Concern  . Not on file  Social History Narrative   Pt lives with his mom, brother, and 79 yo son in [place].   Quit tobacco in Jan. 2011 prior to heart surgery (prior -1/2 PPD (down from 1 ppd per day, onset age 26)    alcohol abuse- prior, hasn't drank since age 39   no illicit drug use    Currently unemployed, awaiting disability   Single         Social Determinants of Health   Financial Resource Strain: Not on file  Food Insecurity: Not on file  Transportation Needs: Not on file  Physical Activity: Not on file  Stress: Not on file  Social Connections: Not on file  Intimate Partner Violence: Not on file   Vitals:   03/19/20 1014  BP: 120/80  Pulse: 65  SpO2: 99%  Weight: 83.2 kg (183 lb 6.4 oz)    PHYSICAL EXAM: General:  Thin, well appearing. No respiratory difficulty HEENT: normal Neck: supple. no JVD. Carotids 2+ bilat; no bruits. No lymphadenopathy or thyromegaly appreciated. Cor: PMI nondisplaced. Regular rate & rhythm. No rubs, gallops or murmurs. Lungs: clear Abdomen: soft, nontender, nondistended. No hepatosplenomegaly. No bruits or masses. Good bowel sounds. Extremities: no cyanosis, clubbing, rash, edema Neuro: alert & oriented x 3, cranial nerves grossly intact. moves all 4 extremities w/o difficulty.  Affect pleasant.  ECG today 03/19/20: SR, 69 bpm (personally reviewed).  ASSESSMENT & PLAN: 1.Chronic systolic heart failure secondary to ICM - Echo9/21: EF 15%, global hypokinesis, mildly reduced right ventricular systolic function, mildly dilated  left atrial, mod MR, mod TR, positive bubble study.  - ICD removed in April 2020 due to MSSA bacteremia. Deemed not to be a candidate for replacement due to continued IV drug use.  - NYHA II, euvolemic on exam today. - Start carvedilol 3.125 mg bid. - Continue lasix 40 mg bid. - Continue amio 200 mg daily.  - Continue digoxin 0.125 mcg daily. - Continue Entresto 24-26 mg bid. Consider increasing at next visit. - Continue spiro 25 mg daily. - Continue Farxiga 10 mg daily. - Continue Xarelto 20 mg daily. - Will see if patient can obtain medication through HF fund. Appreciate pharmacy assistance. - BMET, BNP, Digoxin level, & CBC today.  2. CAD s/p CABG - Cath 11/09: LAD occluded at the ostium, patent RCA and sequential LIMA-D1&D2, ostial LCx looks improved, no change to OM1, patent OM2, with severe disease in distal LCx. RA 8, PA 55/20 (mean 33), PCWP 20, Fick CO 4/CI1.91, PVR 3.2.  - Troponin inpatient unremarkable, d-dimer elevated but VQ scan negative for PE. - No CP. Continue statin.  3. Hx of COPD and current tobacco use - Cough inpatient has resolved. - Cut down to 1 ppd, encouraged further cessation. - Clear on exam today, no fever/chills.  4. Hx of IVDA - Reports last use was Labor Day. - Encouraged to stay abstinent. - Needs appointment with PCP for Suboxone.  5. Hx of CVA thought to be embolic in nature - No bleeding issues. - Continue Xarelto 20 mg daily.  - CBC today.  6. Elevated Creatinine - Cr baseline ~1.0, discharge Cr 1.13. - BMET today.  7. Hx of paroxysmal VT - SR on ECG today, no palpitations. - Continue amiodarone 200 mg daily. - Needs yearly eye exams. LFTs ok 12/21, follow thyroid.  8.  Social - Does not have reliable transportation. - Will arrange for [facility] transport.   Follow Up in 3 weeks with PharmD for further medication titration and back in APP clinic in 6 weeks.   Stallings, [place], FNP-BC 03/19/20

## 2020-03-19 NOTE — Addendum Note (Signed)
Encounter addended by: Rafael Bihari, FNP on: 03/19/2020 4:00 PM  Actions taken: Clinical Note Signed

## 2020-03-19 NOTE — Telephone Encounter (Signed)
I received a message that the patient was concerned about his medication co-pays. Called and spoke with the patient, he remembered his medication being expensive with prior insurance.   Upon investigation, it looks like he is approved for LIS. Brand name 90 day supply co-pays should be, $9.20. Generic, $3.70.  Requested to have 90 day supplies sent to [facility], per the patient's request. Advised him to call me with any issues.  Charlann Boxer, CPhT

## 2020-03-19 NOTE — Progress Notes (Signed)
PCP: Patient, No Pcp Per HF Cardiologist: Dr. Aundra Dubin  HPI:  John Davenport 44 y.o. M PMH: recent hospitalization for MSSA bacteremia and ICD removal, paroxysmal ventricular tachycardia, CAD s/p CABG (MI with occlusion LAD 10/9 tx w BMS, reocclusion of LAD stent 6/10 and LIMA-D1 & D2 1/11 with NSTEMI 6/17 99% stenosis small distal LCX), chronic systolic CHF secondary to ICM, COPD, CVA s/p tPA and mechanical thrombectomy (2/18) thought related to cardio-embolic naturewas on xarelto,CKD, and hepatitis C.   Recent admission 12/2-12/4 for heartburn which he notes feels similar to his "blockages." He could not lay flat and had to sit up all night to help with breathing. He also had green productive cough. In the ED,labs showed mild hypokalemia otherwise unremarkable (troponin & LFTS were normal; BNP 1,700 likely from being off GDMT). EKG showed SR and slight TWI in lateral leads similar to EKG 10/2019. CXR suggestive of bilateral pulmonary edema, VQ scan negative for PE. Patient appeared to strictly have left-sided symptoms. Patient was instructed to discontinue meds on discharge after last hospitalization on 11/25/19.Patient previously had ICD, but this was removed in April 2020 due to MSSA bacteremia. He was diuresed with IV furosemide 40 mg on arrival and put on IV furosemide 80 mg BID during admission. He was diuresed to a net of -7.4 L by day of discharge and given 500 mL IV NS before he was discharged. He was started on Entresto 24-26 mg PO daily, spironolactone 25 mg daily, digoxin 0.125 mg daily and Farxiga 10 mg daily. His carvedilol was held until discharge. Baseline SCr around 1. SCrincreased to 1.55 from 1.13 on day of discharge. Likely due to over-diureses.  Discharge weight was 177.3 lbs.  Recently presented to HF Clinic for follow up on 03/19/20. Felt good, no SOB. No complaints of swelling, CP, dizziness, orthopnea/PND.  No fever/chills.  Smoking 1 ppd, down from 2 ppd. No ETOH. No  IVDU since labor day. Weights a home ~182-183 lbs. Buying Suboxone on the street, wants to be seen in internal medicine clinic to obtain medication legally. Lives with mom and does not have reliable transportation. Taking all medications as directed.  Today he returns to HF clinic for pharmacist medication titration. At last visit with APP, carvedilol 3.125 mg BID was initiated. Had recent hospitalization on 03/28/20 for TIA. No changes were made to HF GDMT but he was started on suboxone. Overall he is feeling fine today. Feels like he tires more easily since ICD removal. Notes some dizziness in the mornings when he first gets up. No SOB with mild activity. No chest pains or palpitations. Weight has been stable at home on furosemide 40 mg BID. Has not needed any extra. No LEE, PND or orthopnea. BP in clinic was 122/64. However, he did not take his medications this morning. States his BP at home is "low" but cannot remember values.    HF Medications: Carvedilol 3.125 mg BID Entresto 24/26 mg BID Spironolactone 25 mg daily Farxiga 10 mg daily Digoxin 0.125 mg daily Furosemide 40 mg BID  Has the patient been experiencing any side effects to the medications prescribed?  no  Does the patient have any problems obtaining medications due to transportation or finances?   no  Understanding of regimen: good Understanding of indications: good Potential of compliance: good Patient understands to avoid NSAIDs. Patient understands to avoid decongestants.    Pertinent Lab Values: . 03/30/20: Serum creatinine 1.30, BUN 28, Potassium 4.2, Sodium 137  Vital Signs: . Weight: 177.2  lbs (last clinic weight: 177.3 lbs) . Blood pressure: 122/64 - did not take morning medications yet.   Marland Kitchen Heart rate: 63   Assessment: 1.Chronic systolic heart failure secondary to ICM - Echo9/21: EF 15%, global hypokinesis, mildly reduced right ventricular systolic function, mildly dilated left atrial, mod MR, mod TR,  positive bubble study.  - ICD removed in April 2020 due to MSSA bacteremia. Deemed not to be a candidate for replacement due to continued IV drug use. - NYHA II, euvolemic on exam today. - Unable to obtain labs in clinic today.  - Continue furosemide 40 mg BID - Continue carvedilol 3.125 mg BID. - Continue Entresto 24/26 mg BID. Unable to uptitrate as he had not taken his morning medications today. Plan to uptitrate at next visit pending BP.  - Continue spironolactone 25 mg daily. - Continue Farxiga 10 mg daily. - Continue digoxin 0.125 mg daily.  2. CAD s/p CABG - Cath 11/09: LAD occluded at the ostium, patent RCA and sequential LIMA-D1&D2, ostial LCx looks improved, no change to OM1, patent OM2, with severe disease in distal LCx. RA 8, PA 55/20 (mean 33), PCWP 20, Fick CO 4/CI1.91, PVR 3.2.  - Troponin inpatient unremarkable, d-dimer elevated but VQ scan negative for PE. - No CP. Continue statin.  3. Hx of COPD and current tobacco use - Cough inpatient has resolved. - Cut down to 1 ppd, encouraged further cessation.  4. Hx of IVDA - Reports last use was Labor Day. - Encouraged to stay abstinent. - Started on suboxone during recent hospitalization. Will follow up in IM clinic.   5. Hx of CVA thought to be embolic in nature - No bleeding issues. - Continue Xarelto 20 mg daily.   6.Elevated Creatinine - SCrbaseline ~1.0, discharge SCr 1.13. - AKI during recent hospitalization, but Scr downtrending. Unable to obtain labs in clinic today.   7. Hx of paroxysmal VT - Continue amiodarone 200 mg daily. - Needs yearly eye exams. LFTs ok 12/21,    Plan: 1) Medication changes: Based on clinical presentation, vital signs and recent labs will make no medication changes today given that he had not taken his morning medications and recent hospitalization for TIA.  2) Labs: unable to draw labs in clinic 3) Follow-up: 3 weeks with APP Clinic.    Audry Riles, PharmD, BCPS,  BCCP, CPP Heart Failure Clinic Pharmacist [phone]

## 2020-03-28 ENCOUNTER — Other Ambulatory Visit: Payer: Self-pay

## 2020-03-28 ENCOUNTER — Encounter (HOSPITAL_COMMUNITY): Payer: Self-pay | Admitting: Emergency Medicine

## 2020-03-28 ENCOUNTER — Emergency Department (HOSPITAL_COMMUNITY): Payer: Medicare Other

## 2020-03-28 ENCOUNTER — Emergency Department (HOSPITAL_BASED_OUTPATIENT_CLINIC_OR_DEPARTMENT_OTHER): Payer: Medicare Other

## 2020-03-28 ENCOUNTER — Inpatient Hospital Stay (HOSPITAL_COMMUNITY)
Admission: EM | Admit: 2020-03-28 | Discharge: 2020-03-30 | DRG: 069 | Disposition: A | Discharge status: Home / Self Care | Payer: Medicare Other | Attending: Student in an Organized Health Care Education/Training Program | Admitting: Student in an Organized Health Care Education/Training Program

## 2020-03-28 DIAGNOSIS — F411 Generalized anxiety disorder: Secondary | ICD-10-CM | POA: Diagnosis not present

## 2020-03-28 DIAGNOSIS — G459 Transient cerebral ischemic attack, unspecified: Secondary | ICD-10-CM

## 2020-03-28 DIAGNOSIS — Z8679 Personal history of other diseases of the circulatory system: Secondary | ICD-10-CM | POA: Diagnosis not present

## 2020-03-28 DIAGNOSIS — I6389 Other cerebral infarction: Secondary | ICD-10-CM | POA: Diagnosis not present

## 2020-03-28 DIAGNOSIS — Z8619 Personal history of other infectious and parasitic diseases: Secondary | ICD-10-CM | POA: Diagnosis not present

## 2020-03-28 DIAGNOSIS — I255 Ischemic cardiomyopathy: Secondary | ICD-10-CM | POA: Diagnosis not present

## 2020-03-28 DIAGNOSIS — E785 Hyperlipidemia, unspecified: Secondary | ICD-10-CM | POA: Diagnosis present

## 2020-03-28 DIAGNOSIS — Z20822 Contact with and (suspected) exposure to covid-19: Secondary | ICD-10-CM | POA: Diagnosis present

## 2020-03-28 DIAGNOSIS — I252 Old myocardial infarction: Secondary | ICD-10-CM

## 2020-03-28 DIAGNOSIS — Z85118 Personal history of other malignant neoplasm of bronchus and lung: Secondary | ICD-10-CM

## 2020-03-28 DIAGNOSIS — G9389 Other specified disorders of brain: Secondary | ICD-10-CM | POA: Diagnosis not present

## 2020-03-28 DIAGNOSIS — E860 Dehydration: Secondary | ICD-10-CM | POA: Diagnosis not present

## 2020-03-28 DIAGNOSIS — R9431 Abnormal electrocardiogram [ECG] [EKG]: Secondary | ICD-10-CM | POA: Diagnosis not present

## 2020-03-28 DIAGNOSIS — G4733 Obstructive sleep apnea (adult) (pediatric): Secondary | ICD-10-CM | POA: Diagnosis present

## 2020-03-28 DIAGNOSIS — Z95810 Presence of automatic (implantable) cardiac defibrillator: Secondary | ICD-10-CM

## 2020-03-28 DIAGNOSIS — I2510 Atherosclerotic heart disease of native coronary artery without angina pectoris: Secondary | ICD-10-CM | POA: Diagnosis present

## 2020-03-28 DIAGNOSIS — I959 Hypotension, unspecified: Secondary | ICD-10-CM | POA: Diagnosis present

## 2020-03-28 DIAGNOSIS — K219 Gastro-esophageal reflux disease without esophagitis: Secondary | ICD-10-CM | POA: Diagnosis present

## 2020-03-28 DIAGNOSIS — Z7984 Long term (current) use of oral hypoglycemic drugs: Secondary | ICD-10-CM

## 2020-03-28 DIAGNOSIS — K140 Glossitis: Secondary | ICD-10-CM | POA: Diagnosis not present

## 2020-03-28 DIAGNOSIS — Z7901 Long term (current) use of anticoagulants: Secondary | ICD-10-CM

## 2020-03-28 DIAGNOSIS — Z951 Presence of aortocoronary bypass graft: Secondary | ICD-10-CM | POA: Diagnosis not present

## 2020-03-28 DIAGNOSIS — I5042 Chronic combined systolic (congestive) and diastolic (congestive) heart failure: Secondary | ICD-10-CM | POA: Diagnosis present

## 2020-03-28 DIAGNOSIS — F17210 Nicotine dependence, cigarettes, uncomplicated: Secondary | ICD-10-CM | POA: Diagnosis not present

## 2020-03-28 DIAGNOSIS — D649 Anemia, unspecified: Secondary | ICD-10-CM | POA: Diagnosis not present

## 2020-03-28 DIAGNOSIS — R29898 Other symptoms and signs involving the musculoskeletal system: Secondary | ICD-10-CM | POA: Diagnosis present

## 2020-03-28 DIAGNOSIS — R200 Anesthesia of skin: Secondary | ICD-10-CM | POA: Diagnosis not present

## 2020-03-28 DIAGNOSIS — J3489 Other specified disorders of nose and nasal sinuses: Secondary | ICD-10-CM | POA: Diagnosis not present

## 2020-03-28 DIAGNOSIS — G5792 Unspecified mononeuropathy of left lower limb: Secondary | ICD-10-CM | POA: Diagnosis not present

## 2020-03-28 DIAGNOSIS — I130 Hypertensive heart and chronic kidney disease with heart failure and stage 1 through stage 4 chronic kidney disease, or unspecified chronic kidney disease: Secondary | ICD-10-CM | POA: Diagnosis not present

## 2020-03-28 DIAGNOSIS — Z8673 Personal history of transient ischemic attack (TIA), and cerebral infarction without residual deficits: Secondary | ICD-10-CM | POA: Diagnosis not present

## 2020-03-28 DIAGNOSIS — R202 Paresthesia of skin: Secondary | ICD-10-CM | POA: Diagnosis not present

## 2020-03-28 DIAGNOSIS — N183 Chronic kidney disease, stage 3 unspecified: Secondary | ICD-10-CM | POA: Diagnosis not present

## 2020-03-28 DIAGNOSIS — Z79899 Other long term (current) drug therapy: Secondary | ICD-10-CM

## 2020-03-28 DIAGNOSIS — I5022 Chronic systolic (congestive) heart failure: Secondary | ICD-10-CM | POA: Diagnosis present

## 2020-03-28 DIAGNOSIS — N179 Acute kidney failure, unspecified: Secondary | ICD-10-CM | POA: Diagnosis present

## 2020-03-28 DIAGNOSIS — F1121 Opioid dependence, in remission: Secondary | ICD-10-CM | POA: Diagnosis present

## 2020-03-28 DIAGNOSIS — Z8249 Family history of ischemic heart disease and other diseases of the circulatory system: Secondary | ICD-10-CM

## 2020-03-28 LAB — BASIC METABOLIC PANEL
Anion gap: 11 (ref 5–15)
BUN: 24 mg/dL — ABNORMAL HIGH (ref 6–20)
CO2: 25 mmol/L (ref 22–32)
Calcium: 9 mg/dL (ref 8.9–10.3)
Chloride: 104 mmol/L (ref 98–111)
Creatinine, Ser: 1.99 mg/dL — ABNORMAL HIGH (ref 0.61–1.24)
GFR, Estimated: 41 mL/min — ABNORMAL LOW (ref >60)
Glucose, Bld: 94 mg/dL (ref 70–99)
Potassium: 4.7 mmol/L (ref 3.5–5.1)
Sodium: 140 mmol/L (ref 135–145)

## 2020-03-28 LAB — CBC
HCT: 41.5 % (ref 39.0–52.0)
Hemoglobin: 12.8 g/dL — ABNORMAL LOW (ref 13.0–17.0)
MCH: 27.6 pg (ref 26.0–34.0)
MCHC: 30.8 g/dL (ref 30.0–36.0)
MCV: 89.4 fL (ref 80.0–100.0)
Platelets: 214 10*3/uL (ref 150–400)
RBC: 4.64 MIL/uL (ref 4.22–5.81)
RDW: 15.9 % — ABNORMAL HIGH (ref 11.5–15.5)
WBC: 13.8 10*3/uL — ABNORMAL HIGH (ref 4.0–10.5)
nRBC: 0 % (ref 0.0–0.2)

## 2020-03-28 LAB — COMPREHENSIVE METABOLIC PANEL
ALT: 27 U/L (ref 0–44)
AST: 33 U/L (ref 15–41)
Albumin: 4.2 g/dL (ref 3.5–5.0)
Alkaline Phosphatase: 71 U/L (ref 38–126)
Anion gap: 14 (ref 5–15)
BUN: 26 mg/dL — ABNORMAL HIGH (ref 6–20)
CO2: 22 mmol/L (ref 22–32)
Calcium: 9 mg/dL (ref 8.9–10.3)
Chloride: 102 mmol/L (ref 98–111)
Creatinine, Ser: 2.09 mg/dL — ABNORMAL HIGH (ref 0.61–1.24)
GFR, Estimated: 39 mL/min — ABNORMAL LOW (ref >60)
Glucose, Bld: 52 mg/dL — ABNORMAL LOW (ref 70–99)
Potassium: 4.9 mmol/L (ref 3.5–5.1)
Sodium: 138 mmol/L (ref 135–145)
Total Bilirubin: 0.4 mg/dL (ref 0.3–1.2)
Total Protein: 8.2 g/dL — ABNORMAL HIGH (ref 6.5–8.1)

## 2020-03-28 LAB — IRON AND TIBC
Iron: 39 ug/dL — ABNORMAL LOW (ref 45–182)
Saturation Ratios: 10 % — ABNORMAL LOW (ref 17.9–39.5)
TIBC: 398 ug/dL (ref 250–450)
UIBC: 359 ug/dL

## 2020-03-28 LAB — CBC WITH DIFFERENTIAL/PLATELET
Abs Immature Granulocytes: 0.05 10*3/uL (ref 0.00–0.07)
Basophils Absolute: 0.1 10*3/uL (ref 0.0–0.1)
Basophils Relative: 0 %
Eosinophils Absolute: 0 10*3/uL (ref 0.0–0.5)
Eosinophils Relative: 0 %
HCT: 45.2 % (ref 39.0–52.0)
Hemoglobin: 13.1 g/dL (ref 13.0–17.0)
Immature Granulocytes: 0 %
Lymphocytes Relative: 23 %
Lymphs Abs: 3.5 10*3/uL (ref 0.7–4.0)
MCH: 26.7 pg (ref 26.0–34.0)
MCHC: 29 g/dL — ABNORMAL LOW (ref 30.0–36.0)
MCV: 92.2 fL (ref 80.0–100.0)
Monocytes Absolute: 1.1 10*3/uL — ABNORMAL HIGH (ref 0.1–1.0)
Monocytes Relative: 8 %
Neutro Abs: 10 10*3/uL — ABNORMAL HIGH (ref 1.7–7.7)
Neutrophils Relative %: 69 %
Platelets: 215 10*3/uL (ref 150–400)
RBC: 4.9 MIL/uL (ref 4.22–5.81)
RDW: 16.1 % — ABNORMAL HIGH (ref 11.5–15.5)
WBC: 14.7 10*3/uL — ABNORMAL HIGH (ref 4.0–10.5)
nRBC: 0 % (ref 0.0–0.2)

## 2020-03-28 LAB — RESP PANEL BY RT-PCR (FLU A&B, COVID) ARPGX2
Influenza A by PCR: NEGATIVE
Influenza B by PCR: NEGATIVE
SARS Coronavirus 2 by RT PCR: NEGATIVE

## 2020-03-28 LAB — CBG MONITORING, ED
Glucose-Capillary: 97 mg/dL (ref 70–99)
Glucose-Capillary: 123 mg/dL — ABNORMAL HIGH (ref 70–99)

## 2020-03-28 LAB — TSH: TSH: 4.058 u[IU]/mL (ref 0.350–4.500)

## 2020-03-28 LAB — MAGNESIUM: Magnesium: 2.1 mg/dL (ref 1.7–2.4)

## 2020-03-28 LAB — VITAMIN B12: Vitamin B-12: 295 pg/mL (ref 180–914)

## 2020-03-28 LAB — ETHANOL: Alcohol, Ethyl (B): <10 mg/dL (ref <10)

## 2020-03-28 LAB — RETICULOCYTES
Immature Retic Fract: 6.8 % (ref 2.3–15.9)
RBC.: 4.86 MIL/uL (ref 4.22–5.81)
Retic Count, Absolute: 38.9 10*3/uL (ref 19.0–186.0)
Retic Ct Pct: 0.8 % (ref 0.4–3.1)

## 2020-03-28 LAB — HEMOGLOBIN A1C
Hgb A1c MFr Bld: 5.3 % (ref 4.8–5.6)
Mean Plasma Glucose: 105.41 mg/dL

## 2020-03-28 LAB — PHOSPHORUS: Phosphorus: 4 mg/dL (ref 2.5–4.6)

## 2020-03-28 LAB — HIV ANTIBODY (ROUTINE TESTING W REFLEX): HIV Screen 4th Generation wRfx: NONREACTIVE

## 2020-03-28 LAB — FOLATE: Folate: 12.6 ng/mL (ref >5.9)

## 2020-03-28 LAB — FERRITIN: Ferritin: 27 ng/mL (ref 24–336)

## 2020-03-28 MED ORDER — ACETAMINOPHEN 650 MG RE SUPP: 650.0000 mg | Freq: Four times a day (QID) | RECTAL | Status: DC | PRN

## 2020-03-28 MED ORDER — SACUBITRIL-VALSARTAN 24-26 MG PO TABS
1.0000 | ORAL_TABLET | Freq: Two times a day (BID) | ORAL | Status: DC
  Administered 2020-03-28: 22:00:00 1 via ORAL
  Filled 2020-03-28 (×3): qty 1

## 2020-03-28 MED ORDER — ACETAMINOPHEN 325 MG PO TABS: 650.0000 mg | ORAL_TABLET | Freq: Four times a day (QID) | ORAL | Status: DC | PRN

## 2020-03-28 MED ORDER — AMIODARONE HCL 200 MG PO TABS
200.0000 mg | ORAL_TABLET | Freq: Every day | ORAL | Status: DC
  Administered 2020-03-28 – 2020-03-30 (×3): 200 mg via ORAL
  Filled 2020-03-28 (×3): qty 1

## 2020-03-28 MED ORDER — ROSUVASTATIN CALCIUM 20 MG PO TABS
20.0000 mg | ORAL_TABLET | Freq: Every day | ORAL | Status: DC
  Administered 2020-03-28 – 2020-03-30 (×3): 20 mg via ORAL
  Filled 2020-03-28: qty 4
  Filled 2020-03-28 (×2): qty 1

## 2020-03-28 MED ORDER — FUROSEMIDE 40 MG PO TABS
40.0000 mg | ORAL_TABLET | Freq: Two times a day (BID) | ORAL | Status: DC
  Administered 2020-03-28 – 2020-03-30 (×4): 40 mg via ORAL
  Filled 2020-03-28: qty 1
  Filled 2020-03-28: qty 2
  Filled 2020-03-28 (×2): qty 1

## 2020-03-28 MED ORDER — RIVAROXABAN 20 MG PO TABS
20.0000 mg | ORAL_TABLET | Freq: Every day | ORAL | Status: DC
  Administered 2020-03-28 – 2020-03-29 (×2): 20 mg via ORAL
  Filled 2020-03-28 (×2): qty 1

## 2020-03-28 MED ORDER — SODIUM CHLORIDE 0.9 % IV BOLUS
500.0000 mL | Freq: Once | INTRAVENOUS | Status: AC
  Administered 2020-03-28: 11:00:00 500 mL via INTRAVENOUS

## 2020-03-28 MED ORDER — SODIUM CHLORIDE 0.9 % IV BOLUS
500.0000 mL | Freq: Once | INTRAVENOUS | Status: AC
  Administered 2020-03-28: 15:00:00 500 mL via INTRAVENOUS

## 2020-03-28 MED ORDER — DAPAGLIFLOZIN PROPANEDIOL 10 MG PO TABS
10.0000 mg | ORAL_TABLET | Freq: Every day | ORAL | Status: DC
  Administered 2020-03-29 – 2020-03-30 (×2): 10 mg via ORAL
  Filled 2020-03-28 (×3): qty 1

## 2020-03-28 MED ORDER — CARVEDILOL 3.125 MG PO TABS
3.1250 mg | ORAL_TABLET | Freq: Two times a day (BID) | ORAL | Status: DC
  Administered 2020-03-29 – 2020-03-30 (×2): 3.125 mg via ORAL
  Filled 2020-03-28 (×3): qty 1

## 2020-03-28 MED ORDER — DIGOXIN 125 MCG PO TABS
0.1250 mg | ORAL_TABLET | Freq: Every day | ORAL | Status: DC
  Administered 2020-03-28 – 2020-03-29 (×2): 0.125 mg via ORAL
  Filled 2020-03-28 (×3): qty 1

## 2020-03-28 MED ORDER — SPIRONOLACTONE 25 MG PO TABS
25.0000 mg | ORAL_TABLET | Freq: Every day | ORAL | Status: DC
  Administered 2020-03-28: 16:00:00 25 mg via ORAL
  Filled 2020-03-28 (×2): qty 1

## 2020-03-28 NOTE — Hospital Course (Addendum)
Transient Ischemic Attack: Patient presented with left lower extremity mononeuropathy with paresthesias over the dorsal foot and numbness over the plantar aspect. All work-up were within normal limits for infectious, metabolic, mechanical, or iatrogenic causes of his neuropathy.  Patient regain sensation in the left foot within 24 hours.  Due to his risk factors, his presenting symptoms was thought to be likely secondary to transient ischemic attack.  MRA of the neck/head did not show any large vessel occlusions.  Repeat echo not significantly different from previous and no evidence of mural thrombus.  Patient presenting symptoms will resolve at discharge he was restarted on his home medications.  Polysubstance use disorder: Patient has a history of IV drug use with last heroin use being September 2021.  Patient became diaphoretic on the second day of admission.  He was started on Suboxone 8-2 mg 1 tablet, sublingual twice daily to avoid further symptoms of withdrawal.  Patient was discharged home with prescription for Suboxone.  He was instructed to follow-up with [facility] to establish care and start monitored Suboxone treatments.   AKI: On admission, patient was found to have a creatinine of 1.99 with a GFR of 41 from baseline of creatinine of 1.1 and EGFR > 60. This was likely secondary to dehydration in the setting of recent history of vomiting and polydipsia.  Patient received some IV fluids in the ED.  His home Lasix will continue during admission.  In the setting of soft BPs and AKI, his Entresto spironolactone were held during admission and resolved at discharge.  Creatinine was down to 1.3 on day of discharge.  Patient will need repeat BMP to monitor kidney function.  HFrEF: Patient was euvolemic on exam.  His guideline directed medical therapy will continue.  Entresto were held due to AKI and spironolactone were held due to soft blood pressures.  All home medications were restarted at discharge.    CAD: We continued his home rosuvastatin 20 mg daily and Xarelto 20 mg daily.   Hypotension: Patient had soft BPs with systolic in the 02T and 11N on admission.  His Entresto spironolactone were held due to the low blood pressures.  Patient received IV fluids and BP started trending up. On day of discharge, patient blood pressure was 114/73.  Home medications were restarted at discharge.  Patient will need close monitoring of BP in the outpatient.   Normocytic anemia: Patient has a anemia with hemoglobin of 12.7 and an RDW of 15.9 and MCV of 89.3.  He had a mildly low ferritin at 27, a normal folate at 12.6 a low/normal B12 of 295, low iron of 39 with a normal TIBC and decreased saturation of 10.  Patient's reticulocyte count was inappropriately normal at 0.8 with an index of 0.81 showing hypoproliferative state.  Collectively this was concerning for iron deficiency anemia with some mild decrease in B12.  There was no record of colonoscopy on file for patient.  There is no documented history of colon cancer in the family.  However patient may benefit from outpatient referral for colonoscopy.  Will likely require iron/B12 supplementation.

## 2020-03-28 NOTE — H&P (Addendum)
Date: 03/28/2020               Patient Name:  John Davenport MRN: [id number]  DOB: 07/23/1975 Age / Sex: 44 y.o., male   PCP: Patient, No Pcp Per         Medical Service: Internal Medicine Teaching Service         Attending Physician: Dr. Jimmye Norman, Elaina Pattee, MD    First Contact: John Saunas, MD, [place] Pager: [phone])  Second Contact: John Davenport Pager: [place] [phone])       After Hours (After 5p/  First Contact Pager: [phone]  weekends / holidays): Second Contact Pager: [phone]   SUBJECTIVE   Chief Complaint: Left lower extremity numbness   History of Present Illness: John Davenport is a 44 y.o. 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  Patient states that the numbness in his lower extremity began at 3 AM this morning.  He woke up needing to use the bathroom and noticed that his left foot felt numb.  Believe that it was "asleep".  Therefore, he sat on the edge of the bed and tried to move it to see if it would return to normal.  Patient states that he waited 30 minutes to an hour without return of sensation.  Also endorses left hand paresthesias with pins and needle sensation of his left hand.  Patient states that he believes he may have fallen when trying to walk to the bathroom but does not remember the incident stating that he has a knot  on his head and a broken TV but is unsure how it happened.  Patient states that he became alarmed regarding his lower extremity change in sensation and went to go speak to his mom about it.  She is unable to cooperate his story of fall and denies hearing any noise from his bedroom overnight.  Patient states that the pins-and-needles in his hands have improved since coming to the ED but his loss of sensation in his feet has remained the same. He states that he has good strength in his foot but has decreased sensation over the dorsal and plantar surface of his foot. He admits to multiple episodes of emesis this morning without any prodromal symptoms such as nausea.  He had at least 3 episodes prior to admission and 2 more in the ED. He denies any blood or bile in his emesis. He states that he is not employed as he is on disability but does endorse doing Dealer work daily, but he denies any exposure to heavy metals such as mercury, copper, lead. Furthermore he denies any alcohol use, risk of STIs or tick bites.  Patient denies any chest pain, shortness of breath, fever, chills, change in vision, change in strength, orthostasis, dysuria, hematuria recent infections, significant changes in medications, or any recent substance use.  He does admit to significant polydipsia this  morning and continues to have significant hunger and thirst.   Medications:  Current Meds  Medication Sig  . amiodarone (PACERONE) 200 MG tablet Take 1 tablet (200 mg total) by mouth daily.  . carvedilol (COREG) 3.125 MG tablet Take 1 tablet (3.125 mg total) by mouth 2 (two) times daily.  . dapagliflozin propanediol (FARXIGA) 10 MG TABS tablet Take 1 tablet (10 mg total) by mouth daily.  . digoxin (LANOXIN) 0.125 MG tablet Take 1 tablet (0.125 mg total) by mouth daily.  . furosemide (LASIX) 40 MG tablet Take 1 tablet (40 mg total) by mouth 2 (two) times daily. With extra 40 mg as needed for 3 pound weight gain.  . rivaroxaban (XARELTO)  20 MG TABS tablet Take 1 tablet (20 mg total) by mouth daily with supper.  . rosuvastatin (CRESTOR) 20 MG tablet Take 1 tablet (20 mg total) by mouth daily.  . sacubitril-valsartan (ENTRESTO) 24-26 MG Take 1 tablet by mouth 2 (two) times daily.  Marland Kitchen spironolactone (ALDACTONE) 25 MG tablet Take 1 tablet (25 mg total) by mouth daily.    Social:  Lives - [place] with his Mother Occupation - currently on disability  Support - Mother Level of function - independent with ADLs/IADLs PCP - [facility] Substance use -his current substance use.  Family History:  Family History  Problem Relation Age of Onset  . Heart attack Father 72       died at 54 after multiple MI's  . COPD Maternal Grandmother     Allergies: Allergies as of 03/28/2020  . (No Known Allergies)   Past Medical History:  Diagnosis Date  . Anxiety state, unspecified   . Back pain   . Cancer ([place])    Lung CA  . Chronic systolic heart failure ([place])    a. Ischemic CM (EF 30% by echo in 2011) b. echo 08/2015: EF 20% with severe HK and AK of the inferoseptal and apical myocardium.  . Coronary artery disease    a. 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  b. eventually required single vessel CABG (L-LAD) in 03/2009 due to restenosis. c. cath 2013 showed patent LIMA sequential to the diags with LAD filling retrograde, no new disease in RCA and Cx, low LVEDP, sx felt noncardiac. d. 08/2015: NSTEMI: CTO of prox LAD, 99% stenosis of small dCx (no stent), + for cocaine  . Esophageal reflux   . Heart attack ([place])   . History of hepatitis C virus infection 07/15/2018   07/20/18 Viral RNA negative suggest past, cleared infection  . Hypercholesteremia   . ICD (implantable cardioverter-defibrillator) infection ([place])    a. [place] [employer] ICD 12/2008.   . Ischemic cardiomyopathy    echocardiogram 12/11: Mild LVH, EF 30-35%, anteroseptal and apical akinesis, grade 1 diastolic dysfunction  . LBP (low back  pain)   . OSA (obstructive sleep apnea) 12/30/2010  . Paroxysmal ventricular tachycardia ([place])   . Stroke ([place])   . Tobacco abuse     Review of Systems: A complete ROS was negative except as per HPI.   OBJECTIVE:   Physical Exam: Blood pressure (!) 90/50, pulse (!) 52, temperature 98.1 F (36.7 C), temperature source Oral, resp. rate 14, SpO2 99 %. Physical Exam Constitutional:      Appearance: Normal appearance.  HENT:     Head: Normocephalic and atraumatic.     Nose: Nose normal.     Mouth/Throat:     Mouth: Mucous membranes are  moist.     Pharynx: Oropharynx is clear.     Comments: Poor dentition throughout with a ulcer on the tip of his tongue. Eyes:     General: No scleral icterus.    Extraocular Movements: Extraocular movements intact.     Conjunctiva/sclera: Conjunctivae normal.     Pupils: Pupils are equal, round, and reactive to light.  Neck:     Comments: No significant elevation in JVD Cardiovascular:     Rate and Rhythm: Normal rate and regular rhythm.     Pulses: Normal pulses.     Heart sounds: Normal heart sounds.  Pulmonary:     Effort: Pulmonary effort is normal.     Breath sounds: Normal breath sounds.  Abdominal:     General: Abdomen is flat. There is no distension.     Palpations: Abdomen is soft.     Tenderness: There is no abdominal tenderness.  Musculoskeletal:        General: No swelling, tenderness or deformity. Normal range of motion.     Cervical back: Normal range of motion. No rigidity or tenderness.  Skin:    General: Skin is warm and dry.  Neurological:     Mental Status: He is alert and oriented to person, place, and time.     Cranial Nerves: No cranial nerve deficit.     Sensory: Sensory deficit (Loss of temperature and pain sensation over the plantar surface of his left foot) present.     Motor: No weakness, tremor or abnormal muscle tone.     Coordination: Coordination is intact. Coordination normal.     Deep Tendon Reflexes:  Reflexes normal.     Comments: Paresthesias over the dorsal aspect of the left foot     Labs: CBC    Component Value Date/Time   WBC 13.8 (H) 03/28/2020 0728   RBC 4.64 03/28/2020 0728   HGB 12.8 (L) 03/28/2020 0728   HCT 41.5 03/28/2020 0728   PLT 214 03/28/2020 0728   MCV 89.4 03/28/2020 0728   MCH 27.6 03/28/2020 0728   MCHC 30.8 03/28/2020 0728   RDW 15.9 (H) 03/28/2020 0728   LYMPHSABS 3.2 03/01/2020 0732   MONOABS 0.6 03/01/2020 0732   EOSABS 0.3 03/01/2020 0732   BASOSABS 0.1 03/01/2020 0732     CMP     Component Value Date/Time   NA 140 03/28/2020 0728   K 4.7 03/28/2020 0728   CL 104 03/28/2020 0728   CO2 25 03/28/2020 0728   GLUCOSE 94 03/28/2020 0728   BUN 24 (H) 03/28/2020 0728   CREATININE 1.99 (H) 03/28/2020 0728   CALCIUM 9.0 03/28/2020 0728   PROT 7.7 03/01/2020 1206   ALBUMIN 3.2 (L) 03/01/2020 1206   AST 20 03/01/2020 1206   ALT 19 03/01/2020 1206   ALKPHOS 104 03/01/2020 1206   BILITOT 0.6 03/01/2020 1206   GFRNONAA 41 (L) 03/28/2020 0728   GFRAA >60 11/30/2019 0444    Imaging: CT HEAD WO CONTRAST  Result Date: 03/28/2020 CLINICAL DATA:  44 year old male with right side weakness onset this morning. 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 EXAM: CT HEAD WITHOUT CONTRAST TECHNIQUE: Contiguous axial images were obtained from the base of the skull through the vertex without intravenous contrast. 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 Underlying cerebral volume is normal. Gray-white matter differentiation elsewhere throughout the brain appears stable and within normal limits. No midline shift, ventriculomegaly, mass effect, evidence  of mass lesion, intracranial hemorrhage or evidence of cortically based acute infarction. ASPECTS 10. Vascular: Mild Calcified atherosclerosis at the skull base. No suspicious intracranial vascular hyperdensity. Skull:  Negative. Sinuses/Orbits: Visualized paranasal sinuses and mastoids are clear. Other: Mildly Disconjugate gaze although similar to prior. No acute orbit or scalp soft tissue findings. IMPRESSION: 1. No acute cortically based infarct or acute intracranial hemorrhage identified. ASPECTS 10. 2. Chronic right cerebellum PICA infarct unchanged since 2018. Electronically Signed   By: Genevie Ann M.D.   On: 03/28/2020 08:22   MR BRAIN WO CONTRAST  Result Date: 03/28/2020 CLINICAL DATA:  Acute left foot numbness EXAM: MRI HEAD WITHOUT CONTRAST TECHNIQUE: Multiplanar, multiecho pulse sequences of the brain and surrounding structures were obtained without intravenous contrast. COMPARISON:  None FINDINGS: Brain: There is no acute infarction or intracranial hemorrhage. Right greater than left chronic cerebellar infarcts. There is no intracranial mass, mass effect, or edema. There is no hydrocephalus or extra-axial fluid collection. Ventricles and sulci are normal in size and configuration. Vascular: Major vessel flow voids at the skull base are preserved. Skull and upper cervical spine: Normal marrow signal is preserved. Sinuses/Orbits: Minor mucosal thickening.  Orbits are unremarkable. Other: Sella is unremarkable.  Mastoid air cells are clear. IMPRESSION: No evidence of recent infarction, hemorrhage, or mass. Chronic cerebellar infarcts. Electronically Signed   By: Macy Mis M.D.   On: 03/28/2020 13:55   VAS [place] ABI WITH/WO TBI  Result Date: 03/28/2020 LOWER EXTREMITY DOPPLER STUDY Indications: Paresthesia. High Risk Factors: Hyperlipidemia.  Comparison Study: No prior studies. Performing Technologist: John Davenport RVT  Examination Guidelines: A complete evaluation includes at minimum, Doppler waveform signals and systolic blood pressure reading at the level of bilateral brachial, anterior tibial, and posterior tibial arteries, when vessel segments are accessible. Bilateral testing is considered an integral part  of a complete examination. Photoelectric Plethysmograph (PPG) waveforms and toe systolic pressure readings are included as required and additional duplex testing as needed. Limited examinations for reoccurring indications may be performed as noted.  ABI Findings: +---------+------------------+-----+---------+--------+ Right    Rt Pressure (mmHg)IndexWaveform Comment  +---------+------------------+-----+---------+--------+ Brachial 83                     triphasic         +---------+------------------+-----+---------+--------+ PTA      70                0.84 biphasic          +---------+------------------+-----+---------+--------+ DP       77                0.93 biphasic          +---------+------------------+-----+---------+--------+ Great Toe0                 0.00                   +---------+------------------+-----+---------+--------+ +---------+------------------+-----+---------+-------+ Left     Lt Pressure (mmHg)IndexWaveform Comment +---------+------------------+-----+---------+-------+ Brachial 74                     triphasic        +---------+------------------+-----+---------+-------+ PTA      78                0.94 biphasic         +---------+------------------+-----+---------+-------+ DP       80                0.96 biphasic         +---------+------------------+-----+---------+-------+  Great Toe0                 0.00                  +---------+------------------+-----+---------+-------+ +-------+-----------+-----------+------------+------------+ ABI/TBIToday's ABIToday's TBIPrevious ABIPrevious TBI +-------+-----------+-----------+------------+------------+ Right  0.93       0                                   +-------+-----------+-----------+------------+------------+ Left   0.96       0                                   +-------+-----------+-----------+------------+------------+  Summary: Right: Resting right  ankle-brachial index indicates mild right lower extremity arterial disease. Unable to obtain TBI due to low amplitude waveforms. Left: Resting left ankle-brachial index is within normal range. No evidence of significant left lower extremity arterial disease. Unable to obtain TBI due to low amplitude waveforms.  *See table(s) above for measurements and observations.     Preliminary     EKG: personally reviewed my interpretation is sinus rhythm with left atrial enlargement, left bundle branch block which is new from previous EKG, new T wave inversions in lateral leads including lead V4 through V6 and lead I and II and aVF.  ASSESSMENT & PLAN:   Active Problems:   Lower extremity weakness    John Davenport is a 44 y.o. 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 male with a pertinent PMH of who presents to [facility] with acute onset left foot numbness.  #Left lower extremity numbness and tingling  #Sensory mononeuropathy #Paresthesias of left lower extremity Patient presents with acute onset left lower extremity numbness and tingling is predominantly located on the dorsal and plantar surface of his left foot.  Patient has retained motor function.  Although he did admit to some  pins-and-needles sensation of his left hand, these have resolved.  Therefore, he likely has a a sensory mononeuropathy that is predominant over the S1 L5 nerve roots specifically the plantar surface of his foot.  Patient had absent pain and temperature sensation on the plantar surface and the remainder of his foot had normal sensation.  Work-up so far is negative for intracranial abnormalities, and peripheral artery disease with negative ABIs. Furthermore exam was negative for tarsal tunnel entrapment.  Patient does not have any radicular symptoms or motor findings making spinal cord pathology on likely.  With more he denies any pain or trauma to his lower back.  Patient denies a history of diabetes and has not had any abnormal hemoglobin A1c is in the past.  He is on amiodarone but has  had normal TSH in the past.  We will repeat that today.  Furthermore amiodarone can rarely cause peripheral neuropathy which seems unlikely as this is unilateral but cannot be excluded at this time. Patient does have an anemia with normal MCV and an elevated RDW making iron deficiency versus possible anemia of chronic disease likely.  We will do iron studies and B12/folate levels during this admission.  Patient denies any history of sexually transmitted infections or increased risk factors for this.  Regardless I will get an HIV test today.  I will defer testing for syphilis at this time.  Patient does have a history of substance use including IV drug use, cocaine, and alcohol.  He states that he has been sober for many months now.  UDS is pending patient is also on digoxin with a recent digoxin level earlier this month within normal limits.  We will repeat this level today although I do not think this is related to his neuropathy.  Patient does not have any significant electrolyte abnormalities but we will get a magnesium and phosphorus level today.  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  We will consider possible testing for heavy metal toxicity in the future if all other work-up is negative.  Alternatively patient does have a tongue ulcer, which raises concern for inflammatory etiologies including Bassette syndrome or another associated autoimmune vasculitides.  This seems less likely but will keep it on the differential. Furthermore we will consider MRI of his lumbar/sacral spine tomorrow and  possible neuro consult if his symptoms do not improve. -MRI/CT head negative -Pending for PAD -BMP negative for significant electrolyte abnormalities -Magnesium and phosphorus pending -TSH pending if positive will get T3 and free T4 -B12 pending -Hemoglobin A1c pending -HIV pending -Consider MRI of his lumbosacral spine -Consider heavy metal levels -Consider vitamin E, vitamin B6 levels -Consider discontinuing amiodarone as it could be contributing to possible side effects. -Consider ESR/CRP and ANA to rule out inflammatory vasculitides.  #Syncopal episode: Patient presented with possible concern for a syncopal episode.  He states that he may have fallen last night when trying to go to the bathroom.  He does not remember the event and his mother cannot corroborate his story.  Patient denies any preterminal symptoms.  Considering he does have a significant cardiovascular history including significant ischemic cardiomyopathy with moderate mitral regurg, there is some concern that his syncopal episode may be cardiogenic in nature particularly in the setting of low blood pressures.  Otherwise he denies any centrally acting medications, history of seizures, altered mental status, or arrhythmias that could have caused this.  Furthermore we will get orthostatic vitals to evaluate his risk of orthostatic hypotension as a precipitating factor in his syncopal events.  All intracranial imaging is negative for intracranial pathology -Orthostatic vitals -Consider repeat echo  tomorrow -Fall precautions  #AKI: Patient presents with an elevated Cr of 1.99 with a GFR of 41 from a baseline of Cr of 1.1 and GFR of > 60. He has an acute history of vomiting and does admit to polydipsia with soft BPs putting him at risk forpre-renal azotemia. He does not appear overtly hypervolemic on exam. He received some IV fluids in the ED. I will restart him on a diet to see if his kidney function improves. Furthermore, I will continue him on home his home diuretic. Reevaluate kidney function in the morning. If not improved, then I will get a urine  Na and Cr.  - Start HH diet - Home diuretic  - Recheck kidney function in the morning - AM urine Cr and Na  #HFrEF Patient recently established with heart failure clinic.  We will continue patient on outpatient heart failure medications including carvedilol 3.125 mg twice daily, the pack [place] 10 mg daily, digoxin 0.125 mg daily, furosemide 40 mg twice daily, Entresto 24-26 mg twice daily and spironolactone 25 mg daily.  We will need to consider dose reducing some of these medications if the patient remains borderline hypotensive. -Continue guideline directed heart failure medications -Consider repeat echocardiogram  #CAD Patient has a extensive history of coronary artery disease outlined above in the patient summary.  We will continue him on rosuvastatin 20 mg daily and Xarelto 20 mg daily as he has previously had an embolic stroke.  Patient's bleeding risk too high to tolerate double therapy with Xarelto and antiplatelet therapy. -Continue rosuvastatin 20 mg daily -Continue Xarelto 20 mg daily  #Hypotension: Patient on many medications that would reduce his blood pressure.  We will have to monitor these closely as he is having some low blood pressures.  We will do orthostatic vitals today and tomorrow and decrease blood pressure medications as needed.  #Normocytic anemia: We will do B12, folate, ferritin, TIBC, saturation, reticulocyte  count to further evaluate his normocytic anemia. -Labs pending  #HLD #HTN #CVA -Medications as outlined above.     Diet: Heart Healthy VTE: NOAC IVF: None,None Code: Full  Prior to Admission Living Arrangement: Home, living mother Anticipated Discharge Location: Home Barriers to Discharge: further evaluation   Dispo: Admit patient to Observation with expected length of stay less than 2 midnights.  Signed: Lawerance Davenport, D.O.  Internal Medicine Resident, PGY-2 [facility]  Pager: [phone] 5:22 PM, 03/28/2020   Please contact the on call pager after 5 pm and on weekends at [phone].

## 2020-03-28 NOTE — ED Notes (Signed)
Patient transported to MRI 

## 2020-03-28 NOTE — ED Provider Notes (Signed)
[facility] EMERGENCY DEPARTMENT Provider Note   CSN: [id number] Arrival date & time: 03/28/20  [id number]     History Chief Complaint  Patient presents with  . Numbness    John Davenport is a 44 y.o. male with PMH significant for CAD s/p CABG x5, paroxysmal V. tach with history of ICD placement, ICD removal 06/2018 due to MSSA bacteremia with vegetation on device, chronic HFrEF, embolic CVA, polysubstance abuse, and stage III CKD who presents to the ED with complaints of numbness beginning at 2 AM.   On my examination, patient reports that he was feeling much improved after his most recent hospitalization, discharged home March 03, 2020.  Most recent echocardiogram revealed EF 15% with global hypokinesis and he was diuresed during his hospitalization after BNP was found to be elevated to 1700.  Patient was recently evaluated March 19, 2020 by Dr. Aundra Dubin with [facility] and started on carvedilol twice daily in addition to continued Lasix, amiodarone, digoxin, Entresto, spironolactone, Farxiga, and Xarelto.  Patient has been taking all of his medications, as directed.  He went to bed last evening at approximately 10 PM feeling entirely well, but woke up around 3 AM to go the bathroom.  He states that he was unable to ambulate because his left foot was completely numb.  He also endorsed left hand tingling sensation, but reports that his sensation was grossly intact and it was not nearly as significant as his left foot.  He also reports that his left foot had been feeling cold, especially when compared to the right foot.  This morning he woke up in his symptoms had not improved whatsoever.  He reports that he has significantly diminished sensation in left foot and has the tingling pain that is approximately 3 times worse than normal tingling pain that he associates with sitting/lying down too long.  Patient also states that he had nausea and an episode of nonbloody emesis  around the time that he woke up.  He is concerned given his history of stroke.  Patient endorses history of IV heroin, but has used since Labor Day weekend.  He denies any recent fevers or chills, headache or dizziness, chest pain or shortness of breath, weakness, blurred vision, precipitating trauma/injury, dysarthria, or memory disturbance.  HPI     Past Medical History:  Diagnosis Date  . Anxiety state, unspecified   . Back pain   . Cancer ([place])    Lung CA  . Chronic systolic heart failure ([place])    a. Ischemic CM (EF 30% by echo in 2011) b. echo 08/2015: EF 20% with severe HK and AK of the inferoseptal and apical myocardium.  . Coronary artery disease    a. 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  b. eventually required single vessel CABG (L-LAD) in 03/2009 due to restenosis. c. cath 2013 showed patent LIMA sequential to the diags with LAD filling retrograde, no new disease in RCA and Cx, low LVEDP, sx felt noncardiac. d. 08/2015: NSTEMI: CTO of prox LAD, 99% stenosis of small dCx (no stent), + for cocaine  . Esophageal reflux   . Heart attack ([place])   . History of hepatitis C virus infection 07/15/2018   07/20/18 Viral RNA negative suggest past, cleared infection  . Hypercholesteremia   . ICD (implantable cardioverter-defibrillator) infection ([place])    a. [place] Sci ICD 12/2008.   . Ischemic cardiomyopathy    echocardiogram 12/11: Mild LVH,  EF 30-35%, anteroseptal and apical akinesis, grade 1 diastolic dysfunction  . LBP (low back pain)   . OSA (obstructive sleep apnea) 12/30/2010  . Paroxysmal ventricular tachycardia ([place])   . Stroke ([place])   . Tobacco abuse     Patient Active Problem List   Diagnosis Date Noted  . Acute on chronic combined systolic and diastolic congestive heart failure ([place])   . Acute HFrEF (heart failure with reduced ejection fraction) ([place]) 03/01/2020  . Bacteremia 11/26/2019  . Abscess 11/26/2019  . Hypokalemia  11/26/2019  . IV drug user 11/26/2019  . Pressure ulcer of sacral region, stage 2 ([place]) 07/22/2018  . AKI (acute kidney injury) ([place])   . History of hepatitis C virus infection 07/15/2018  . Hyponatremia 07/15/2018  . Thrombocytopenia ([place]) 07/14/2018  . ICD (implantable cardioverter-defibrillator) infection ([place]) 07/13/2018  . Opioid use disorder, severe, in early remission ([place]) 07/13/2018  . Anaphylaxis 07/12/2018  . Bacteremia due to methicillin susceptible Staphylococcus aureus (MSSA) 07/12/2018  . MSSA bacteremia 07/12/2018  . Chest pain 07/11/2018  . Encounter for therapeutic drug monitoring 06/09/2016  . Embolic stroke ([place]) [id number]  . Hypotension 05/23/2016  . Smokeless tobacco use 05/23/2016  . Cocaine abuse ([place]) 05/23/2016  . Cerebrovascular accident (CVA) due to thrombosis of left middle cerebral artery (HCC) s/p IV tPA and mechanical thrombectomy   . Chronic systolic congestive heart failure ([place])   . Cough with sputum 04/17/2016  . Intractable upper abdominal pain 03/09/2016  . CKD (chronic kidney disease), stage III ([place]) 03/09/2016  . 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 RUQ abdominal pain 03/09/2016  . NSTEMI (non-ST elevated myocardial infarction) ([place]) 09/05/2015  . Ischemic cardiomyopathy 09/05/2015  . Tobacco abuse 09/05/2015  . Hyperlipidemia 09/05/2015  . Renal insufficiency 09/05/2015  . OSA (obstructive sleep apnea) 12/30/2010  . Cough 12/09/2010  . Dental abscess 12/09/2010  . GERD 04/10/2010  . PAROXYSMAL VENTRICULAR TACHYCARDIA 03/22/2010  . Chronic combined systolic and diastolic CHF (congestive heart failure) ([place]) 03/22/2010  . Generalized anxiety disorder 05/16/2009  . MYOCARDIAL INFARCTION 12/19/2008  . CAD in native artery 12/19/2008    Past Surgical History:  Procedure Laterality Date  . BUBBLE STUDY  11/30/2019   Procedure: BUBBLE STUDY;  Surgeon: Elouise Munroe, MD;  Location: [facility] ENDOSCOPY;  Service: Cardiology;;  .  CARDIAC CATHETERIZATION N/A 09/06/2015   Procedure: Left Heart Cath and Cors/Grafts Angiography;  Surgeon: Sherren Mocha, MD;  Location: [facility] CV LAB;  Service: Cardiovascular;  Laterality: N/A;  . CARDIAC DEFIBRILLATOR PLACEMENT    . CARDIAC DEFIBRILLATOR PLACEMENT     2010 by JA  . CORONARY ANGIOPLASTY WITH STENT PLACEMENT     LAD stenting 10/09 and re-opening 6/10  . CORONARY ARTERY BYPASS GRAFT     1/11  . ICD LEAD REMOVAL Left 07/16/2018   Procedure: ICD LEAD REMOVAL;  Surgeon: Evans Lance, MD;  Location: [facility];  Service: Cardiovascular;  Laterality: Left;  [place] back up  . IR GENERIC HISTORICAL  05/21/2016   IR PERCUTANEOUS ART THROMBECTOMY/INFUSION INTRACRANIAL INC DIAG ANGIO 05/21/2016 Luanne Bras, MD [facility]-INTER[facility] RAD  . IR GENERIC HISTORICAL  06/16/2016   IR RADIOLOGIST EVAL & MGMT 06/16/2016 [facility]  . LEFT HEART CATHETERIZATION WITH CORONARY ANGIOGRAM N/A 08/07/2011   Procedure: LEFT HEART CATHETERIZATION WITH CORONARY ANGIOGRAM;  Surgeon: Josue Hector, MD;  Location: [facility] CATH LAB;  Service: Cardiovascular;  Laterality: N/A;  . RADIOLOGY WITH ANESTHESIA N/A 05/21/2016   Procedure: RADIOLOGY WITH ANESTHESIA;  Surgeon: Luanne Bras, MD;  Location: [facility];  Service: Radiology;  Laterality: N/A;  . RIGHT/LEFT HEART CATH AND CORONARY ANGIOGRAPHY N/A 02/03/2018   Procedure: RIGHT/LEFT HEART CATH AND CORONARY ANGIOGRAPHY;  Surgeon: Larey Dresser, MD;  Location: [facility] CV LAB;  Service: Cardiovascular;  Laterality: N/A;  . TEE WITHOUT CARDIOVERSION N/A 07/16/2018   Procedure: TRANSESOPHAGEAL ECHOCARDIOGRAM (TEE);  Surgeon: Evans Lance, MD;  Location: [facility];  Service: Cardiovascular;  Laterality: N/A;  . TEE WITHOUT CARDIOVERSION N/A 11/30/2019   Procedure: TRANSESOPHAGEAL ECHOCARDIOGRAM (TEE);  Surgeon: Elouise Munroe, MD;  Location: [facility];  Service: Cardiology;  Laterality: N/A;       Family History  Problem Relation Age of Onset  . Heart attack  Father 60       died at 20 after multiple MI's  . COPD Maternal Grandmother     Social History   Tobacco Use  . Smoking status: Current Every Day Smoker    Packs/day: 0.50    Years: 20.00    Pack years: 10.00    Types: Cigarettes  . Smokeless tobacco: Former Systems developer    Types: Chew  . Tobacco comment: smoke 0.5 per week  Vaping Use  . Vaping Use: Never used  Substance Use Topics  . Alcohol use: No  . Drug use: Yes    Types: Cocaine    Home Medications Prior to Admission medications   Medication Sig Start Date End Date Taking? Authorizing Provider  amiodarone (PACERONE) 200 MG tablet Take 1 tablet (200 mg total) by mouth daily. 03/19/20  Yes Milford, Maricela Bo, FNP  carvedilol (COREG) 3.125 MG tablet Take 1 tablet (3.125 mg total) by mouth 2 (two) times daily. 03/19/20 03/19/21 Yes Milford, Maricela Bo, FNP  dapagliflozin propanediol (FARXIGA) 10 MG TABS tablet Take 1 tablet (10 mg total) by mouth daily. 03/19/20  Yes Milford, Maricela Bo, FNP  digoxin (LANOXIN) 0.125 MG tablet Take 1 tablet (0.125 mg total) by mouth daily. 03/19/20  Yes Milford, Maricela Bo, FNP  furosemide (LASIX) 40 MG tablet Take 1 tablet (40 mg total) by mouth 2 (two) times daily. With extra 40 mg as needed for 3 pound weight gain. 03/19/20  Yes Milford, Maricela Bo, FNP  rivaroxaban (XARELTO) 20 MG TABS tablet Take 1 tablet (20 mg total) by mouth daily with supper. 03/19/20  Yes Milford, Maricela Bo, FNP  rosuvastatin (CRESTOR) 20 MG tablet Take 1 tablet (20 mg total) by mouth daily. 03/03/20  Yes Clegg, Amy D, NP  sacubitril-valsartan (ENTRESTO) 24-26 MG Take 1 tablet by mouth 2 (two) times daily. 03/19/20  Yes Milford, Maricela Bo, FNP  spironolactone (ALDACTONE) 25 MG tablet Take 1 tablet (25 mg total) by mouth daily. 03/19/20  Yes Milford, Maricela Bo, FNP  pantoprazole (PROTONIX) 40 MG tablet Take 1 tablet (40 mg total) by mouth daily. Patient not taking: Reported on 03/28/2020 12/02/19 01/01/20  Kayleen Memos, DO     Allergies    Patient has no known allergies.  Review of Systems   Review of Systems  All other systems reviewed and are negative.   Physical Exam Updated Vital Signs BP (!) 79/54   Pulse (!) 58   Temp 97.6 F (36.4 C)   Resp 14   SpO2 96%   Physical Exam Vitals and nursing note reviewed. Exam conducted with a chaperone present.  Constitutional:      General: He is not in acute distress.    Appearance: He is not toxic-appearing.  HENT:     Head: Normocephalic  and atraumatic.     Mouth/Throat:     Comments: Significantly poor dentition throughout.  Patent oropharynx.  Uvula rises midline. Eyes:     General: No scleral icterus.    Extraocular Movements: Extraocular movements intact.     Conjunctiva/sclera: Conjunctivae normal.     Pupils: Pupils are equal, round, and reactive to light.     Comments: No nystagmus.  PERRL and EOM intact.  Cardiovascular:     Rate and Rhythm: Normal rate and regular rhythm.     Heart sounds: Normal heart sounds. No murmur heard.     Comments: No obvious murmur. Pulmonary:     Effort: Pulmonary effort is normal.  Musculoskeletal:        General: Normal range of motion.     Cervical back: Normal range of motion. No rigidity.     Comments: No midline spinal tenderness or overlying skin changes.  Skin:    General: Skin is dry.  Neurological:     Mental Status: He is alert and oriented to person, place, and time.     GCS: GCS eye subscore is 4. GCS verbal subscore is 5. GCS motor subscore is 6.     Sensory: Sensory deficit present.     Comments: Diminished sensation in left foot and ankle compared to contralateral side.  Bedside ultrasound revealed pedal pulse and posterior tibial pulses intact and symmetric with contralateral foot.  Left foot feels to be mildly cooler and pale comparatively.  Can wiggle toes.  ROM and strength intact.  Psychiatric:        Mood and Affect: Mood normal.        Behavior: Behavior normal.        Thought  Content: Thought content normal.     ED Results / Procedures / Treatments   Labs (all labs ordered are listed, but only abnormal results are displayed) Labs Reviewed  BASIC METABOLIC PANEL - Abnormal; Notable for the following components:      Result Value   BUN 24 (*)    Creatinine, Ser 1.99 (*)    GFR, Estimated 41 (*)    All other components within normal limits  CBC - Abnormal; Notable for the following components:   WBC 13.8 (*)    Hemoglobin 12.8 (*)    RDW 15.9 (*)    All other components within normal limits  URINALYSIS, ROUTINE W REFLEX MICROSCOPIC  CBG MONITORING, ED    EKG EKG Interpretation  Date/Time:  Wednesday March 28 2020 07:19:23 EST Ventricular Rate:  65 PR Interval:  174 QRS Duration: 154 QT Interval:  406 QTC Calculation: 422 R Axis:   -2 Text Interpretation: Normal sinus rhythm Left bundle branch block Abnormal ECG similar to Mar 19 2020 Confirmed by Sherwood Gambler [phone]) on 03/28/2020 9:49:07 AM   Radiology CT HEAD WO CONTRAST  Result Date: 03/28/2020 CLINICAL DATA:  44 year old male with right side weakness onset this morning. 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 EXAM: CT HEAD WITHOUT CONTRAST TECHNIQUE: Contiguous axial images were obtained from the base of the skull through the vertex without intravenous contrast. 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 Underlying cerebral volume is normal. Gray-white matter differentiation elsewhere throughout the brain appears stable and within normal limits. No midline shift, ventriculomegaly, mass effect, evidence of mass lesion, intracranial hemorrhage or evidence of cortically based acute infarction. ASPECTS 10. Vascular: Mild Calcified atherosclerosis at the skull base. No  suspicious intracranial vascular hyperdensity. Skull: Negative. Sinuses/Orbits: Visualized paranasal sinuses and mastoids are  clear. Other: Mildly Disconjugate gaze although similar to prior. No acute orbit or scalp soft tissue findings. IMPRESSION: 1. No acute cortically based infarct or acute intracranial hemorrhage identified. ASPECTS 10. 2. Chronic right cerebellum PICA infarct unchanged since 2018. Electronically Signed   By: Odessa Fleming M.D.   On: 03/28/2020 08:22   VAS [place] ABI WITH/WO TBI  Result Date: 03/28/2020 LOWER EXTREMITY DOPPLER STUDY Indications: Paresthesia. High Risk Factors: Hyperlipidemia.  Comparison Study: No prior studies. Performing Technologist: Olen Cordial RVT  Examination Guidelines: A complete evaluation includes at minimum, Doppler waveform signals and systolic blood pressure reading at the level of bilateral brachial, anterior tibial, and posterior tibial arteries, when vessel segments are accessible. Bilateral testing is considered an integral part of a complete examination. Photoelectric Plethysmograph (PPG) waveforms and toe systolic pressure readings are included as required and additional duplex testing as needed. Limited examinations for reoccurring indications may be performed as noted.  ABI Findings: +---------+------------------+-----+---------+--------+ Right    Rt Pressure (mmHg)IndexWaveform Comment  +---------+------------------+-----+---------+--------+ Brachial 83                     triphasic         +---------+------------------+-----+---------+--------+ PTA      70                0.84 biphasic          +---------+------------------+-----+---------+--------+ DP       77                0.93 biphasic          +---------+------------------+-----+---------+--------+ Great Toe0                 0.00                   +---------+------------------+-----+---------+--------+ +---------+------------------+-----+---------+-------+ Left     Lt Pressure (mmHg)IndexWaveform Comment +---------+------------------+-----+---------+-------+ Brachial 74                      triphasic        +---------+------------------+-----+---------+-------+ PTA      78                0.94 biphasic         +---------+------------------+-----+---------+-------+ DP       80                0.96 biphasic         +---------+------------------+-----+---------+-------+ Great Toe0                 0.00                  +---------+------------------+-----+---------+-------+ +-------+-----------+-----------+------------+------------+ ABI/TBIToday's ABIToday's TBIPrevious ABIPrevious TBI +-------+-----------+-----------+------------+------------+ Right  0.93       0                                   +-------+-----------+-----------+------------+------------+ Left   0.96       0                                   +-------+-----------+-----------+------------+------------+  Summary: Right: Resting right ankle-brachial index indicates mild right lower extremity arterial disease. Unable to obtain TBI due to low amplitude waveforms. Left: Resting left ankle-brachial index  is within normal range. No evidence of significant left lower extremity arterial disease. Unable to obtain TBI due to low amplitude waveforms.  *See table(s) above for measurements and observations.     Preliminary     Procedures Procedures (including critical care time)  Medications Ordered in ED Medications  sodium chloride 0.9 % bolus 500 mL (has no administration in time range)  sodium chloride 0.9 % bolus 500 mL (500 mLs Intravenous New Bag/Given 03/28/20 1051)    ED Course  I have reviewed the triage vital signs and the nursing notes.  Pertinent labs & imaging results that were available during my care of the patient were reviewed by me and considered in my medical decision making (see chart for details).  Clinical Course as of 03/28/20 1308  Wed Mar 28, 2020  1307 I spoke with Darren with Internal Medicine who will see and admit patient. [GG]    Clinical Course User Index [GG]  Corena Herter, PA-C   MDM Rules/Calculators/A&P                          Left foot paresthesias/diminished sensation concerning for stroke versus vascular disease.  Pedal pulse was difficult to appreciate on exam, however bedside ultrasound revealed pedal pulse and posterior tibial pulses intact and symmetric when compared to contralateral foot.  It does however appear to be more pale and feel cooler than right foot.  Patient is also endorsing left hand tingling, but sensation is symmetric and ROM/strength intact.  No other obvious focal deficits.  Laboratory work-up notable for mild leukocytosis to 13.8 and new AKI with creatinine elevated 1.99 and GFR reduced to 41, worse from normal in labs obtained 3 weeks ago.  Will have to be careful with fluid resuscitation given EF 15% on most recent echocardiogram.  CT head without contrast is personally reviewed and demonstrates no acute intracranial disease.  We will order MRI brain without contrast as well as a vascular US lower extremity arterial duplex to assess for vascular disease as cause for his left foot paresthesias.  I spoke with vascular ultrasound who states that ABI on left leg was normal and with good pulses so cancelled the arterial duplex.    Patient's BP continued to trend lower and be soft.  Added another 500 mL NS despite his hx of significant HFrEF.  Patient will need to be admitted regardless even though MRI is pending given his hypotension and AKI.    I spoke with Darren with Internal Medicine who will see and admit patient.   Final Clinical Impression(s) / ED Diagnoses Final diagnoses:  AKI (acute kidney injury) ([place])  Numbness    Rx / DC Orders ED Discharge Orders    None       Corena Herter, PA-C 03/28/20 1308    Sherwood Gambler, MD 03/29/20 1428

## 2020-03-28 NOTE — ED Notes (Signed)
Dinner Tray Ordered @ 1714. 

## 2020-03-28 NOTE — ED Notes (Signed)
The pt c/o the food  He I asking for graham crackers and milk  Given  C/o lt foot pain and numbness color good temp warm

## 2020-03-28 NOTE — ED Triage Notes (Signed)
Pt arrives to ED with c/o of numbness to left arm and left foot. Numbness started at 2am when pt was awaken from sleep from vomiting. Pt states it is hard for him to walk due to the numbness. No weakness, speech or vision abnormalities.

## 2020-03-28 NOTE — Progress Notes (Signed)
ABI's have been completed. Preliminary results can be found in CV Proc through chart review.  Results were given to Evelena Leyden PA.  03/28/20 12:07 PM Olen Cordial RVT

## 2020-03-29 ENCOUNTER — Encounter (HOSPITAL_COMMUNITY): Payer: Self-pay | Admitting: Internal Medicine

## 2020-03-29 ENCOUNTER — Observation Stay (HOSPITAL_COMMUNITY): Payer: Medicare Other

## 2020-03-29 DIAGNOSIS — Z951 Presence of aortocoronary bypass graft: Secondary | ICD-10-CM | POA: Diagnosis not present

## 2020-03-29 DIAGNOSIS — E860 Dehydration: Secondary | ICD-10-CM | POA: Diagnosis not present

## 2020-03-29 DIAGNOSIS — I639 Cerebral infarction, unspecified: Secondary | ICD-10-CM | POA: Diagnosis not present

## 2020-03-29 DIAGNOSIS — I361 Nonrheumatic tricuspid (valve) insufficiency: Secondary | ICD-10-CM

## 2020-03-29 DIAGNOSIS — I340 Nonrheumatic mitral (valve) insufficiency: Secondary | ICD-10-CM

## 2020-03-29 DIAGNOSIS — R29898 Other symptoms and signs involving the musculoskeletal system: Secondary | ICD-10-CM | POA: Diagnosis present

## 2020-03-29 DIAGNOSIS — I252 Old myocardial infarction: Secondary | ICD-10-CM | POA: Diagnosis not present

## 2020-03-29 DIAGNOSIS — I255 Ischemic cardiomyopathy: Secondary | ICD-10-CM | POA: Diagnosis not present

## 2020-03-29 DIAGNOSIS — N183 Chronic kidney disease, stage 3 unspecified: Secondary | ICD-10-CM | POA: Diagnosis not present

## 2020-03-29 DIAGNOSIS — F1121 Opioid dependence, in remission: Secondary | ICD-10-CM | POA: Diagnosis not present

## 2020-03-29 DIAGNOSIS — G5792 Unspecified mononeuropathy of left lower limb: Secondary | ICD-10-CM | POA: Diagnosis not present

## 2020-03-29 DIAGNOSIS — E785 Hyperlipidemia, unspecified: Secondary | ICD-10-CM | POA: Diagnosis not present

## 2020-03-29 DIAGNOSIS — Z85118 Personal history of other malignant neoplasm of bronchus and lung: Secondary | ICD-10-CM | POA: Diagnosis not present

## 2020-03-29 DIAGNOSIS — G4733 Obstructive sleep apnea (adult) (pediatric): Secondary | ICD-10-CM | POA: Diagnosis not present

## 2020-03-29 DIAGNOSIS — I959 Hypotension, unspecified: Secondary | ICD-10-CM | POA: Diagnosis not present

## 2020-03-29 DIAGNOSIS — K219 Gastro-esophageal reflux disease without esophagitis: Secondary | ICD-10-CM | POA: Diagnosis not present

## 2020-03-29 DIAGNOSIS — I130 Hypertensive heart and chronic kidney disease with heart failure and stage 1 through stage 4 chronic kidney disease, or unspecified chronic kidney disease: Secondary | ICD-10-CM | POA: Diagnosis not present

## 2020-03-29 DIAGNOSIS — I5042 Chronic combined systolic (congestive) and diastolic (congestive) heart failure: Secondary | ICD-10-CM | POA: Diagnosis not present

## 2020-03-29 DIAGNOSIS — G459 Transient cerebral ischemic attack, unspecified: Principal | ICD-10-CM

## 2020-03-29 DIAGNOSIS — I5022 Chronic systolic (congestive) heart failure: Secondary | ICD-10-CM | POA: Diagnosis not present

## 2020-03-29 DIAGNOSIS — N179 Acute kidney failure, unspecified: Secondary | ICD-10-CM | POA: Diagnosis not present

## 2020-03-29 DIAGNOSIS — Z20822 Contact with and (suspected) exposure to covid-19: Secondary | ICD-10-CM | POA: Diagnosis not present

## 2020-03-29 DIAGNOSIS — F17210 Nicotine dependence, cigarettes, uncomplicated: Secondary | ICD-10-CM | POA: Diagnosis not present

## 2020-03-29 DIAGNOSIS — D649 Anemia, unspecified: Secondary | ICD-10-CM | POA: Diagnosis not present

## 2020-03-29 DIAGNOSIS — F411 Generalized anxiety disorder: Secondary | ICD-10-CM | POA: Diagnosis not present

## 2020-03-29 DIAGNOSIS — K140 Glossitis: Secondary | ICD-10-CM | POA: Diagnosis not present

## 2020-03-29 DIAGNOSIS — I2510 Atherosclerotic heart disease of native coronary artery without angina pectoris: Secondary | ICD-10-CM | POA: Diagnosis not present

## 2020-03-29 DIAGNOSIS — R200 Anesthesia of skin: Secondary | ICD-10-CM | POA: Diagnosis present

## 2020-03-29 DIAGNOSIS — Z8673 Personal history of transient ischemic attack (TIA), and cerebral infarction without residual deficits: Secondary | ICD-10-CM | POA: Diagnosis not present

## 2020-03-29 DIAGNOSIS — Z8619 Personal history of other infectious and parasitic diseases: Secondary | ICD-10-CM | POA: Diagnosis not present

## 2020-03-29 LAB — COMPREHENSIVE METABOLIC PANEL
ALT: 23 U/L (ref 0–44)
AST: 28 U/L (ref 15–41)
Albumin: 3.2 g/dL — ABNORMAL LOW (ref 3.5–5.0)
Alkaline Phosphatase: 62 U/L (ref 38–126)
Anion gap: 7 (ref 5–15)
BUN: 29 mg/dL — ABNORMAL HIGH (ref 6–20)
CO2: 27 mmol/L (ref 22–32)
Calcium: 8.6 mg/dL — ABNORMAL LOW (ref 8.9–10.3)
Chloride: 104 mmol/L (ref 98–111)
Creatinine, Ser: 1.42 mg/dL — ABNORMAL HIGH (ref 0.61–1.24)
GFR, Estimated: >60 mL/min (ref >60)
Glucose, Bld: 106 mg/dL — ABNORMAL HIGH (ref 70–99)
Potassium: 4.3 mmol/L (ref 3.5–5.1)
Sodium: 138 mmol/L (ref 135–145)
Total Bilirubin: 0.1 mg/dL — ABNORMAL LOW (ref 0.3–1.2)
Total Protein: 7 g/dL (ref 6.5–8.1)

## 2020-03-29 LAB — DIGOXIN LEVEL: Digoxin Level: 0.2 ng/mL — ABNORMAL LOW (ref 0.8–2.0)

## 2020-03-29 LAB — ECHOCARDIOGRAM COMPLETE
Area-P 1/2: 3.17 cm2
Calc EF: 20.9 %
S' Lateral: 6 cm
Single Plane A2C EF: 25.2 %
Single Plane A4C EF: 19 %
Weight: 2836 oz

## 2020-03-29 LAB — LIPID PANEL
Cholesterol: 116 mg/dL (ref 0–200)
HDL: 38 mg/dL — ABNORMAL LOW (ref >40)
LDL Cholesterol: 65 mg/dL (ref 0–99)
Total CHOL/HDL Ratio: 3.1 RATIO
Triglycerides: 65 mg/dL (ref <150)
VLDL: 13 mg/dL (ref 0–40)

## 2020-03-29 LAB — CBC
HCT: 42.7 % (ref 39.0–52.0)
Hemoglobin: 12.7 g/dL — ABNORMAL LOW (ref 13.0–17.0)
MCH: 26.6 pg (ref 26.0–34.0)
MCHC: 29.7 g/dL — ABNORMAL LOW (ref 30.0–36.0)
MCV: 89.3 fL (ref 80.0–100.0)
Platelets: 169 10*3/uL (ref 150–400)
RBC: 4.78 MIL/uL (ref 4.22–5.81)
RDW: 15.9 % — ABNORMAL HIGH (ref 11.5–15.5)
WBC: 10.8 10*3/uL — ABNORMAL HIGH (ref 4.0–10.5)
nRBC: 0 % (ref 0.0–0.2)

## 2020-03-29 LAB — CREATININE, URINE, RANDOM: Creatinine, Urine: 21.89 mg/dL

## 2020-03-29 LAB — SODIUM, URINE, RANDOM: Sodium, Ur: 127 mmol/L

## 2020-03-29 MED ORDER — PERFLUTREN LIPID MICROSPHERE
1.0000 mL | INTRAVENOUS | Status: AC | PRN
  Administered 2020-03-29: 15:00:00 2 mL via INTRAVENOUS
  Filled 2020-03-29: qty 10

## 2020-03-29 MED ORDER — BUPRENORPHINE HCL-NALOXONE HCL 8-2 MG SL SUBL
1.0000 | SUBLINGUAL_TABLET | Freq: Two times a day (BID) | SUBLINGUAL | Status: DC
  Administered 2020-03-29 – 2020-03-30 (×3): 1 via SUBLINGUAL
  Filled 2020-03-29 (×3): qty 1

## 2020-03-29 NOTE — Plan of Care (Signed)

## 2020-03-29 NOTE — Plan of Care (Signed)
  Problem: Nutrition: Goal: Adequate nutrition will be maintained Outcome: Progressing   Problem: Safety: Goal: Ability to remain free from injury will improve Outcome: Progressing   Problem: Pain Managment: Goal: General experience of comfort will improve Outcome: Progressing   

## 2020-03-29 NOTE — ED Notes (Signed)
Report given to rn on 5n 

## 2020-03-29 NOTE — Evaluation (Signed)
Physical Therapy Evaluation Patient Details Name: John Davenport MRN: [id number] DOB: 1975/09/28 Today's Date: 03/29/2020   History of Present Illness  John Davenport is a 44 y.o. 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  Clinical Impression  PTA, patient was independent and lives with mother. Patient presents with decreased coordination, impaired balance, and generalized weakness. Patient modI for bed mobility and transfers. Patient ambulated 200' with SPC and supervision, LOB x 1 with turning and minA to recover. Patient will benefit from skilled PT services to address listed deficits. Recommend OPPT following discharge to improve dynamic balance and strengthening.     Follow Up Recommendations Outpatient PT    Equipment Recommendations  Cane    Recommendations for Other Services       Precautions / Restrictions Precautions Precautions: Fall Restrictions Weight Bearing Restrictions: No      Mobility  Bed Mobility Overal bed mobility: Modified Independent                  Transfers Overall transfer level: Modified independent Equipment used: None                Ambulation/Gait Ambulation/Gait assistance: Supervision Gait Distance (Feet): 200 Feet Assistive device: Straight cane Gait Pattern/deviations: Decreased dorsiflexion -  left;Step-through pattern;Ataxic;Drifts right/left     General Gait Details: LOB x 1 while turning, minA to recover  Stairs            Wheelchair Mobility    Modified Rankin (Stroke Patients Only)       Balance Overall balance assessment: Mild deficits observed, not formally tested                                           Pertinent Vitals/Pain Pain Assessment: No/denies pain    Home Living Family/patient expects to be discharged to:: Private residence Living Arrangements: Parent Available Help at Discharge: Family;Available 24 hours/day Type of Home: House Home Access: Ramped entrance     Home Layout: One level Home Equipment: None      Prior Function Level of Independence: Independent         Comments: on disability     Hand Dominance        Extremity/Trunk Assessment   Upper Extremity Assessment Upper Extremity Assessment: Overall WFL for tasks assessed    Lower Extremity Assessment Lower Extremity Assessment: LLE deficits/detail LLE Sensation: decreased light touch;decreased proprioception LLE Coordination: decreased gross motor       Communication   Communication: No difficulties  Cognition Arousal/Alertness: Awake/alert Behavior During Therapy: WFL for tasks assessed/performed Overall Cognitive Status: Within Functional Limits for tasks assessed  General Comments      Exercises     Assessment/Plan    PT Assessment Patient needs continued PT services  PT Problem List Decreased strength;Decreased activity tolerance;Decreased balance;Decreased mobility;Decreased coordination;Decreased safety awareness       PT Treatment Interventions Gait training;Functional mobility training;Therapeutic exercise;Therapeutic activities;Balance training;Patient/family education    PT Goals (Current goals can be found in the Care Plan section)  Acute Rehab PT Goals Patient  Stated Goal: to go home and to get feeling in my foot PT Goal Formulation: With patient Time For Goal Achievement: 04/12/20 Potential to Achieve Goals: Good    Frequency Min 3X/week   Barriers to discharge        Co-evaluation               AM-PAC PT "6 Clicks" Mobility  Outcome Measure Help needed turning from your back to your side while in a flat bed without using bedrails?: None Help needed moving from lying on your back to sitting on the side of a flat bed without using bedrails?: None Help needed moving to and from a bed to a chair (including a wheelchair)?: None Help needed standing up from a chair using your arms (e.g., wheelchair or bedside chair)?: None Help needed to walk in hospital room?: A Little Help needed climbing 3-5 steps with a railing? : A Little 6 Click Score: 22    End of Session Equipment Utilized During Treatment: Gait belt Activity Tolerance: Patient tolerated treatment well Patient left: in bed;with call bell/phone within reach;with bed alarm set Nurse Communication: Mobility status PT Visit Diagnosis: Unsteadiness on feet (R26.81);Muscle weakness (generalized) (M62.81);Other abnormalities of gait and mobility (R26.89)    Time: [id number] PT Time Calculation (min) (ACUTE ONLY): 28 min   Charges:   PT Evaluation $PT Eval Low Complexity: 1 Low PT Treatments $Therapeutic Activity: 8-22 mins        Perrin Maltese, PT, DPT Acute Rehabilitation Services Pager [phone] Office [phone]   Melene Plan Allred 03/29/2020, 9:04 AM

## 2020-03-29 NOTE — Plan of Care (Signed)

## 2020-03-29 NOTE — Progress Notes (Signed)
HD#0 Subjective:  Overnight Events: admitted overnight.   Patient patient states that his paresthesias and lower extremity numbness have significantly improved overnight.  Furthermore he denies any new symptoms today including chest pain, shortness of breath, abdominal pain, problems with bowel or bladder function, weakness, or confusion.  Objective:  Vital signs in last 24 hours: Vitals:   03/29/20 0424 03/29/20 0500 03/29/20 0555 03/29/20 0752  BP:   123/77 107/65  Pulse:   64 62  Resp:   18 20  Temp: 97.9 F (36.6 C)  98.1 F (36.7 C) 98.1 F (36.7 C)  TempSrc:   Oral Oral  SpO2:   99% 98%  Weight:  80.4 kg     Supplemental O2: Room Air SpO2: 98 % O2 Flow Rate (L/min): 98 L/min   Physical Exam:  Physical Exam Constitutional:      Appearance: Normal appearance.  Eyes:     Extraocular Movements: Extraocular movements intact.     Conjunctiva/sclera: Conjunctivae normal.  Cardiovascular:     Rate and Rhythm: Normal rate and regular rhythm.     Pulses: Normal pulses.  Pulmonary:     Effort: Pulmonary effort is normal.  Abdominal:     Palpations: Abdomen is soft.  Musculoskeletal:        General: Normal range of motion.     Right lower leg: No edema.     Left lower leg: No edema.  Skin:    General: Skin is warm and dry.  Neurological:     General: No focal deficit present.     Mental Status: He is alert.     Comments: Denies paresthesias in his left hand.  Patient has intact sensation on the left dorsal and plantar surface of his foot.  Specifically intact pain, crude touch, and temperature sensation in these areas.  Otherwise he is neurovascularly intact.  Psychiatric:        Mood and Affect: Mood normal.     Filed Weights   03/29/20 0500  Weight: 80.4 kg     Intake/Output Summary (Last 24 hours) at 03/29/2020 1522 Last data filed at 03/29/2020 1300 Gross per 24 hour  Intake 480 ml  Output --  Net 480 ml   Net IO Since Admission: 480 mL  [03/29/20 1522]  Recent Labs    03/28/20 1222 03/28/20 1918  GLUCAP 97 123*     Pertinent Labs: CBC Latest Ref Rng & Units 03/29/2020 03/28/2020 03/28/2020  WBC 4.0 - 10.5 K/uL 10.8(H) 14.7(H) 13.8(H)  Hemoglobin 13.0 - 17.0 g/dL 12.7(L) 13.1 12.8(L)  Hematocrit 39.0 - 52.0 % 42.7 45.2 41.5  Platelets 150 - 400 K/uL 169 215 214    CMP Latest Ref Rng & Units 03/29/2020 03/28/2020 03/28/2020  Glucose 70 - 99 mg/dL 628(B) 15(V) 94  BUN 6 - 20 mg/dL 76(H) 60(V) 37(T)  Creatinine 0.61 - 1.24 mg/dL 0.62(I) 9.48(N) 4.62(V)  Sodium 135 - 145 mmol/L 138 138 140  Potassium 3.5 - 5.1 mmol/L 4.3 4.9 4.7  Chloride 98 - 111 mmol/L 104 102 104  CO2 22 - 32 mmol/L 27 22 25   Calcium 8.9 - 10.3 mg/dL ) 9.0 9.0  Total Protein 6.5 - 8.1 g/dL 7.0 0.3(J) -  Total Bilirubin 0.3 - 1.2 mg/dL 0.0(X) 0.4 -  Alkaline Phos 38 - 126 U/L 62 71 -  AST 15 - 41 U/L 28 33 -  ALT 0 - 44 U/L 23 27 -    Imaging: MR ANGIO HEAD WO CONTRAST  FINDINGS  Anterior circulation: Patent ICAs. Patent anterior and middle cerebral arteries. No high-grade stenosis, proximal occlusion, aneurysm, or vascular malformation. Posterior circulation: Codominant vertebral arteries. Patent bilateral V4 segments and PICA. The basilar and superior cerebellar arteries are patent. Patent PCAs with fetal origin on the right. No high-grade stenosis, proximal occlusion, aneurysm, or vascular malformation. Anatomic variants: Please see above. IMPRESSION: No large vessel occlusion, high-grade narrowing, dissection or aneurysm. Electronically Signed   By: Stana Buntinghikanele  Emekauwa M.D.   On: 03/29/2020 14:05   MR ANGIO NECK WO CONTRAST  FINDINGS Anterior circulation: Patent ICAs. Patent anterior and middle cerebral arteries. No high-grade stenosis, proximal occlusion, aneurysm, or vascular malformation. Posterior circulation: Codominant vertebral arteries. Patent bilateral V4 segments and PICA. The basilar and superior cerebellar arteries are  patent. Patent PCAs with fetal origin on the right. No high-grade stenosis, proximal occlusion, aneurysm, or vascular malformation. Anatomic variants: Please see above. IMPRESSION: No large vessel occlusion, high-grade narrowing, dissection or aneurysm. Electronically Signed   By: Stana Buntinghikanele  Emekauwa M.D.   On: 03/29/2020 14:05   ECHOCARDIOGRAM COMPLETE  IMPRESSIONS  1. Left ventricular ejection fraction, by estimation, is <20%. The left ventricle has severely decreased function. The left ventricle demonstrates global hypokinesis. The left ventricular internal cavity size was severely dilated. Left ventricular diastolic parameters are consistent with Grade II diastolic dysfunction (pseudonormalization).  2. Right ventricular systolic function is normal. The right ventricular size is normal. There is mildly elevated pulmonary artery systolic pressure. The estimated right ventricular systolic pressure is 38.0 mmHg.  3. Left atrial size was moderately dilated.  4. The mitral valve is normal in structure. Mild mitral valve regurgitation. No evidence of mitral stenosis.  5. The tricuspid valve is myxomatous. Tricuspid valve regurgitation is moderate.  6. The aortic valve is normal in structure. Aortic valve regurgitation is not visualized. No aortic stenosis is present.  7. The inferior vena cava is normal in size with greater than 50% respiratory variability, suggesting right atrial pressure of 3 mmHg.    Assessment/Plan:   Principal Problem:   TIA (transient ischemic attack) Active Problems:   Chronic systolic congestive heart failure (HCC)   Opioid use disorder, severe, in early remission [facility](HCC)   Patient Summary: John Davenport is a 44 y.o. 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   #Transient Ischemic Attack: #Left lower extremity numbness and tingling  #Sensory mononeuropathy #Paresthesias of left lower extremity Patient presented with left lower extremity mononeuropathy with paresthesias over the dorsal foot and numbness over the plantar aspect.  Currently all work-up has been within normal limits for infectious, metabolic, mechanical, or iatrogenic causes of his neuropathy.  Considering his symptoms have-resolved within 24 hours of the onset and his significant past medical history of ischemic cardiomyopathy with an EF of 15% with previous imaging showing mural thrombus and embolic stroke in 2018, it is  likely that his symptoms were secondary to a transient ischemic attack.  We will continue his work-up for TIA and work on reducing his risk factors for further events in the future. -MRI angiogram of his head and neck pending -Echocardiogram pending -Lipid panel pending -Hemoglobin A1c of 5.3   #AKI: Patient's AKI has improved since admission.  His creatinine is 1.42 from 2.09 yesterday.  I suspect this will continue to improve with conservative management and oral rehydration.   -Continue to avoid nephrotoxic medications when possible -We will consider restarting his Entresto tomorrow since his creatinine has improved -Repeat BMP tomorrow morning  #HFrEF Patient still bulimic on exam.  We will continue his guideline directed therapy.  Delene Loll was held due to AKI and prolactin was held due  to some hypotension.  We will restart these medications prior to discharge.  #CAD -Continue rosuvastatin 20 mg daily and Xarelto 20 mg daily  #Hypotension: We will continue to hold Entresto and spironolactone due to low blood pressures.  His pressures do seem to be improving and he does not have any signs of dizziness.  Orthostatic vitals have not been performed yet but he denies any symptoms with ambulation at this time. -We will restart spironolactone and Entresto during this hospitalization.  Normocytic anemia: Patient has a anemia with hemoglobin of 12.7 and an RDW of 15.9 and MCV of 89.3.  He had a mildly low ferritin at 27, a normal folate at 12.6 a low/normal B12 of 295, low iron of 39 with a normal TIBC and decreased saturation of 10.  Patient's reticulocyte count is inappropriately normal at 0.8 with an index of 0.81 showing hypoproliferative state.  Collectively this concerning for iron deficiency anemia with some mild decrease in B12.  I do not see any record of colonoscopy in his past.  Patient is 44 years old without any documented history of colon cancer in his family.  He does not have any overt signs of bleeding at this time.  Patient may benefit from outpatient referral for colonoscopy and iron/B12 supplementation. -Outpatient referral for colonoscopy -Consider B12 and iron supplementation  #HLD #HTN #CVA -Medications as outlined above.  Diet: Cardiac diet IVF: PO intake VTE: rivaroxaban (XARELTO) tablet 20 mg  Code: Full PT/OT: Home Health   Anticipated discharge to Home in 1 days pending completion of TIA work-up.  Lawerance Cruel, D.O.  Internal Medicine Resident, PGY-2 [facility]  Pager: [phone] 3:22 PM, 03/29/2020   Please contact the on call pager after 5 pm and on weekends at [phone].

## 2020-03-29 NOTE — ED Notes (Signed)
The pt continues to c/o pain and numbness in the lt foot  Not the top but the bottom is numb and painful  Foot color good no noticeable swelling  Pedal pluses are present

## 2020-03-29 NOTE — ED Notes (Signed)
The nujrse on 5n will not take report now  She reports that she has to take a break for 30 minutes.  Ed charge notified

## 2020-03-29 NOTE — Progress Notes (Signed)
  Echocardiogram 2D Echocardiogram has been performed.  John Davenport 03/29/2020, 2:43 PM

## 2020-03-30 DIAGNOSIS — G459 Transient cerebral ischemic attack, unspecified: Secondary | ICD-10-CM | POA: Diagnosis not present

## 2020-03-30 DIAGNOSIS — R200 Anesthesia of skin: Secondary | ICD-10-CM | POA: Diagnosis not present

## 2020-03-30 LAB — BASIC METABOLIC PANEL
Anion gap: 11 (ref 5–15)
BUN: 28 mg/dL — ABNORMAL HIGH (ref 6–20)
CO2: 29 mmol/L (ref 22–32)
Calcium: 9.4 mg/dL (ref 8.9–10.3)
Chloride: 97 mmol/L — ABNORMAL LOW (ref 98–111)
Creatinine, Ser: 1.3 mg/dL — ABNORMAL HIGH (ref 0.61–1.24)
GFR, Estimated: >60 mL/min (ref >60)
Glucose, Bld: 92 mg/dL (ref 70–99)
Potassium: 4.2 mmol/L (ref 3.5–5.1)
Sodium: 137 mmol/L (ref 135–145)

## 2020-03-30 MED ORDER — BUPRENORPHINE HCL-NALOXONE HCL 8-2 MG SL SUBL: 1.0000 | SUBLINGUAL_TABLET | Freq: Two times a day (BID) | SUBLINGUAL | 0 refills | Status: DC

## 2020-03-30 NOTE — Plan of Care (Signed)
  Problem: Pain Managment: Goal: General experience of comfort will improve Outcome: Progressing   Problem: Safety: Goal: Ability to remain free from injury will improve Outcome: Progressing   

## 2020-03-30 NOTE — Progress Notes (Signed)
Physical Therapy Treatment Patient Details Name: John Davenport MRN: [id number] DOB: 05-23-1975 Today's Date: 03/30/2020    History of Present Illness John Davenport is a 44 y.o. 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    PT Comments    Goals met and education completed this session. Patient overall modI for mobility with SPC. Mild balance deficits apparent with OOB mobility. Recommend OPPT following discharge to address deficits. PT will sign off.     Follow Up Recommendations  Outpatient PT     Equipment Recommendations  Cane    Recommendations for Other Services       Precautions / Restrictions Precautions Precautions: Fall Restrictions Weight Bearing Restrictions: No    Mobility  Bed Mobility Overal bed mobility: Modified Independent                Transfers Overall transfer level: Modified independent Equipment used: Straight cane                Ambulation/Gait Ambulation/Gait assistance: Modified independent (Device/Increase time) Gait Distance (Feet): 150 Feet Assistive device: Straight cane Gait Pattern/deviations: Decreased dorsiflexion - left;Step-through pattern;Ataxic;Drifts right/left         Stairs             Wheelchair Mobility    Modified Rankin (Stroke Patients Only)       Balance Overall  balance assessment: Mild deficits observed, not formally tested                                          Cognition Arousal/Alertness: Awake/alert Behavior During Therapy: WFL for tasks assessed/performed Overall Cognitive Status: Within Functional Limits for tasks assessed                                        Exercises      General Comments General comments (skin integrity, edema, etc.): Mother present      Pertinent Vitals/Pain Pain Assessment: No/denies pain    Home Living                      Prior Function            PT Goals (current goals can now be found in the care plan section) Acute Rehab PT Goals Patient Stated Goal: to go home and to get feeling in my foot PT Goal Formulation: With patient Time For Goal Achievement: 04/12/20 Potential to Achieve Goals: Good Progress towards PT goals: Goals met/education completed, patient discharged from PT    Frequency    Min 3X/week      PT Plan Current plan remains appropriate    Co-evaluation  AM-PAC PT "6 Clicks" Mobility   Outcome Measure  Help needed turning from your back to your side while in a flat bed without using bedrails?: None Help needed moving from lying on your back to sitting on the side of a flat bed without using bedrails?: None Help needed moving to and from a bed to a chair (including a wheelchair)?: None Help needed standing up from a chair using your arms (e.g., wheelchair or bedside chair)?: None Help needed to walk in hospital room?: None Help needed climbing 3-5 steps with a railing? : None 6 Click Score: 24    End of Session   Activity Tolerance: Patient tolerated treatment well Patient left: in bed;with call bell/phone within reach;with family/visitor present Nurse Communication: Mobility status PT Visit Diagnosis: Unsteadiness on feet (R26.81);Muscle weakness (generalized) (M62.81);Other abnormalities of gait and  mobility (R26.89)     Time: 7673-4193 PT Time Calculation (min) (ACUTE ONLY): 15 min  Charges:  $Therapeutic Activity: 8-22 mins                     John Davenport, PT, DPT Acute Rehabilitation Services Pager [phone] Office [phone]    John Davenport 03/30/2020, 12:19 PM

## 2020-03-30 NOTE — Discharge Summary (Signed)
Name: John Davenport MRN: [id number] DOB: [birth year] 44 y.o. PCP: Patient, No Pcp Per  Date of Admission: 03/28/2020  7:15 AM Date of Discharge: 03/30/2020 Attending Physician: Dr. Evette Doffing  Discharge Diagnosis: Principal Problem:   TIA (transient ischemic attack) Active Problems:   Chronic systolic congestive heart failure (HCC)   Opioid use disorder, severe, in early remission [facility])   Lower extremity weakness    Discharge Medications: Allergies as of 03/30/2020   No Known Allergies     Medication List    STOP taking these medications   pantoprazole 40 MG tablet Commonly known as: PROTONIX     TAKE these medications   amiodarone 200 MG tablet Commonly known as: PACERONE Take 1 tablet (200 mg total) by mouth daily.   buprenorphine-naloxone 8-2 mg Subl SL tablet Commonly known as: SUBOXONE Place 1 tablet under the tongue 2 (two) times daily.   carvedilol 3.125 MG tablet Commonly known as: Coreg Take 1 tablet (3.125 mg total) by mouth 2 (two) times daily.   dapagliflozin propanediol 10 MG Tabs tablet Commonly known as: FARXIGA Take 1 tablet (10 mg total) by mouth daily.   digoxin 0.125 MG tablet Commonly known as: LANOXIN Take 1 tablet (0.125 mg total) by mouth daily.   furosemide 40 MG tablet Commonly known as: LASIX Take 1 tablet (40 mg total) by mouth 2 (two) times daily. With extra 40 mg as needed for 3 pound weight gain.   rivaroxaban 20 MG Tabs tablet Commonly known as: XARELTO Take 1 tablet (20 mg total) by mouth daily with supper.   rosuvastatin 20 MG tablet Commonly known as: CRESTOR Take 1 tablet (20 mg total) by mouth daily.   sacubitril-valsartan 24-26 MG Commonly known as: ENTRESTO Take 1 tablet by mouth 2 (two) times daily.   spironolactone 25 MG tablet Commonly known as: ALDACTONE Take 1 tablet (25 mg total) by mouth daily.            Durable Medical Equipment  (From admission, onward)         Start     Ordered    03/30/20 1002  For home use only DME Cane  Once        03/30/20 1002          Disposition and follow-up:   John Davenport was discharged from [facility] in Stable condition.  At the hospital follow up visit please address:  1.  Follow-up:  A.Transient ischemic attack: Patient presented with left foot numbness/paresthesias and all imaging were negative for acute stroke.  Symptoms were significantly with improved at discharge.  Please assess for complete symptom resolution and adherence to medications.    B.  Polysubstance use: Patient reports pain drug-free since September 2021.  He is currently getting Suboxone from the streets.  Patient interested in Suboxone treatment from [facility] provider.  Patient needs scheduled appointments for Suboxone treatment.   C.  Heart failure: Patient is scheduled to follow-up with heart and vascular on January 4th. Please ensure patient is adherent to his GDMT and follows up with HF   D.  AKI: Patient had AKI on admission.  Creatinine was down to 1.3 but not completely resolved.  Patient's Entresto and spironolactone were resolved at discharge so please check a BMP to monitor kidney function.                E: Normocytic anemia: Patient was found to have normocytic anemia on admission with mildly decreased ferritin.  No  history of colonoscopy on file.  Please consider iron supplementation and outpatient referral for colonoscopy at your discretion.                F: Hypotension: Patient was found to be hypotensive on admission.  BP improved slightly during admission was up to systolicBP in the 353I on discharge. Consider close monitoring of blood pressure and adjust medications as needed.  2.  Labs / imaging needed at time of follow-up: BMP, CBC  3.  Pending labs/ test needing follow-up:   Follow-up Appointments:  Follow-up Information    [facility] Follow up.   Why: [employer] will be delivering a cane to  your hospital room prior to discharge. Contact information: [address] [place] [address] [phone]        [facility] Follow up.   Specialty: Rehabilitation Why: [facility] will be calling you to set up therapy times at their facility. Contact information: [address] [id number] mc [facility] [place] [facility] Course by problem list: Transient Ischemic Attack: Patient presented with left lower extremity mononeuropathy with paresthesias over the dorsal foot and numbness over the plantar aspect. All work-up were within normal limits for infectious, metabolic, mechanical, or iatrogenic causes of his neuropathy.  Patient regain sensation in the left foot within 24 hours.  Due to his risk factors, his presenting symptoms was thought to be likely secondary to transient ischemic attack.  MRA of the neck/head did not show any large vessel occlusions.  Repeat echo not significantly different from previous and no evidence of mural thrombus.  Patient presenting symptoms will resolve at discharge he was restarted on his home medications.  Polysubstance use disorder: Patient has a history of IV drug use with last heroin use being September 2021.  Patient became diaphoretic on the second day of admission.  He was started on Suboxone 8-2 mg 1 tablet, sublingual twice daily to avoid further symptoms of withdrawal.  Patient was discharged home with prescription for Suboxone.  He was instructed to follow-up with [facility] to establish care and start monitored Suboxone treatments.   AKI: On admission, patient was found to have a creatinine of 1.99 with a GFR of 41 from baseline of creatinine of 1.1 and EGFR > 60. This was likely secondary to dehydration in the setting of recent history of vomiting and polydipsia.  Patient received some IV fluids in the ED.  His home Lasix will continue during  admission.  In the setting of soft BPs and AKI, his Entresto spironolactone were held during admission and resolved at discharge.  Creatinine was down to 1.3 on day of discharge.  Patient will need repeat BMP to monitor kidney function.  HFrEF: Patient was euvolemic on exam.  His guideline directed medical therapy will continue.  Entresto were held due to AKI and spironolactone were held due to soft blood pressures.  All home medications were restarted at discharge.   CAD: We continued his home rosuvastatin 20 mg daily and Xarelto 20 mg daily.   Hypotension: Patient had soft BPs with systolic in the 09X and 83J on admission.  His Entresto spironolactone were held due to the low blood pressures.  Patient received IV fluids and BP started trending up. On day of discharge, patient blood pressure was 114/73.  Home medications were restarted at discharge.  Patient will need close monitoring of BP in  the outpatient.   Normocytic anemia: Patient has a anemia with hemoglobin of 12.7 and an RDW of 15.9 and MCV of 89.3.  He had a mildly low ferritin at 27, a normal folate at 12.6 a low/normal B12 of 295, low iron of 39 with a normal TIBC and decreased saturation of 10.  Patient's reticulocyte count was inappropriately normal at 0.8 with an index of 0.81 showing hypoproliferative state.  Collectively this was concerning for iron deficiency anemia with some mild decrease in B12.  There was no record of colonoscopy on file for patient.  There is no documented history of colon cancer in the family.  However patient may benefit from outpatient referral for colonoscopy.  Will likely require iron/B12 supplementation.       Discharge Vitals:   BP 114/73 (BP Location: Right Arm)   Pulse (!) 56   Temp (!) 97.5 F (36.4 C) (Oral)   Resp 17   Wt 80.4 kg   SpO2 97%   BMI 22.15 kg/m   Pertinent Labs, Studies, and Procedures:  CBC Latest Ref Rng & Units 03/29/2020 03/28/2020 03/28/2020  WBC 4.0 - 10.5 K/uL  10.8(H) 14.7(H) 13.8(H)  Hemoglobin 13.0 - 17.0 g/dL 12.7(L) 13.1 12.8(L)  Hematocrit 39.0 - 52.0 % 42.7 45.2 41.5  Platelets 150 - 400 K/uL 169 215 214    CMP Latest Ref Rng & Units 03/30/2020 03/29/2020 03/28/2020  Glucose 70 - 99 mg/dL 92 106(H) 52(L)  BUN 6 - 20 mg/dL 28(H) 29(H) 26(H)  Creatinine 0.61 - 1.24 mg/dL 1.30(H) 1.42(H) 2.09(H)  Sodium 135 - 145 mmol/L 137 138 138  Potassium 3.5 - 5.1 mmol/L 4.2 4.3 4.9  Chloride 98 - 111 mmol/L 97(L) 104 102  CO2 22 - 32 mmol/L '29 27 22  ' Calcium 8.9 - 10.3 mg/dL 9.4 8.6(L) 9.0  Total Protein 6.5 - 8.1 g/dL - 7.0 8.2(H)  Total Bilirubin 0.3 - 1.2 mg/dL - 0.1(L) 0.4  Alkaline Phos 38 - 126 U/L - 62 71  AST 15 - 41 U/L - 28 33  ALT 0 - 44 U/L - 23 27    CT HEAD WO CONTRAST  Result Date: 03/28/2020 CLINICAL DATA:  44 year old male with right side weakness onset this morning. 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 EXAM: CT HEAD WITHOUT CONTRAST TECHNIQUE: Contiguous axial images were obtained from the base of the skull through the vertex without intravenous contrast. 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 Underlying cerebral volume is normal. Gray-white matter differentiation elsewhere throughout the brain appears stable and within normal limits. No midline shift, ventriculomegaly, mass effect, evidence of mass lesion, intracranial hemorrhage or evidence of cortically based acute infarction. ASPECTS 10. Vascular: Mild Calcified atherosclerosis at the skull base. No suspicious intracranial vascular hyperdensity. Skull: Negative. Sinuses/Orbits: Visualized paranasal sinuses and mastoids are clear. Other: Mildly Disconjugate gaze although similar to prior. No acute orbit or scalp soft tissue findings. IMPRESSION: 1. No acute cortically based infarct or acute intracranial hemorrhage identified. ASPECTS 10. 2. Chronic right cerebellum PICA  infarct unchanged since 2018. Electronically Signed   By: Genevie Ann M.D.   On: 03/28/2020 08:22   MR ANGIO HEAD WO CONTRAST  Result Date: 03/29/2020 CLINICAL DATA:  Stroke, follow up EXAM: MRA NECK WITHOUT CONTRAST MRA HEAD WITHOUT CONTRAST TECHNIQUE: Multiplanar and multiecho pulse sequences of the neck were obtained without intravenous contrast. Angiographic images of the neck were obtained using MRA technique without  intravenous contrast; Angiographic images of the Circle of Willis were obtained using MRA technique without intravenous contrast. COMPARISON:  03/28/2020 and prior. FINDINGS: MRA NECK FINDINGS There is no high-grade narrowing or focal aneurysm involving the bilateral carotid arteries. No evidence of dissection. 20% luminal narrowing of the right carotid bulb secondary to atheromatous plaque. The bilateral vertebral arteries are patent and demonstrate antegrade flow. Codominant vertebral arteries. No evidence of high-grade narrowing or focal aneurysm. Three vessel aortic arch.  Patent great vessel origins. MRA HEAD FINDINGS Anterior circulation: Patent ICAs. Patent anterior and middle cerebral arteries. No high-grade stenosis, proximal occlusion, aneurysm, or vascular malformation. Posterior circulation: Codominant vertebral arteries. Patent bilateral V4 segments and PICA. The basilar and superior cerebellar arteries are patent. Patent PCAs with fetal origin on the right. No high-grade stenosis, proximal occlusion, aneurysm, or vascular malformation. Anatomic variants: Please see above. IMPRESSION: No large vessel occlusion, high-grade narrowing, dissection or aneurysm. Electronically Signed   By: Primitivo Gauze M.D.   On: 03/29/2020 14:05   MR ANGIO NECK WO CONTRAST  Result Date: 03/29/2020 CLINICAL DATA:  Stroke, follow up EXAM: MRA NECK WITHOUT CONTRAST MRA HEAD WITHOUT CONTRAST TECHNIQUE: Multiplanar and multiecho pulse sequences of the neck were obtained without intravenous contrast.  Angiographic images of the neck were obtained using MRA technique without intravenous contrast; Angiographic images of the Circle of Willis were obtained using MRA technique without intravenous contrast. COMPARISON:  03/28/2020 and prior. FINDINGS: MRA NECK FINDINGS There is no high-grade narrowing or focal aneurysm involving the bilateral carotid arteries. No evidence of dissection. 20% luminal narrowing of the right carotid bulb secondary to atheromatous plaque. The bilateral vertebral arteries are patent and demonstrate antegrade flow. Codominant vertebral arteries. No evidence of high-grade narrowing or focal aneurysm. Three vessel aortic arch.  Patent great vessel origins. MRA HEAD FINDINGS Anterior circulation: Patent ICAs. Patent anterior and middle cerebral arteries. No high-grade stenosis, proximal occlusion, aneurysm, or vascular malformation. Posterior circulation: Codominant vertebral arteries. Patent bilateral V4 segments and PICA. The basilar and superior cerebellar arteries are patent. Patent PCAs with fetal origin on the right. No high-grade stenosis, proximal occlusion, aneurysm, or vascular malformation. Anatomic variants: Please see above. IMPRESSION: No large vessel occlusion, high-grade narrowing, dissection or aneurysm. Electronically Signed   By: Primitivo Gauze M.D.   On: 03/29/2020 14:05   MR BRAIN WO CONTRAST  Result Date: 03/28/2020 CLINICAL DATA:  Acute left foot numbness EXAM: MRI HEAD WITHOUT CONTRAST TECHNIQUE: Multiplanar, multiecho pulse sequences of the brain and surrounding structures were obtained without intravenous contrast. COMPARISON:  None FINDINGS: Brain: There is no acute infarction or intracranial hemorrhage. Right greater than left chronic cerebellar infarcts. There is no intracranial mass, mass effect, or edema. There is no hydrocephalus or extra-axial fluid collection. Ventricles and sulci are normal in size and configuration. Vascular: Major vessel flow voids  at the skull base are preserved. Skull and upper cervical spine: Normal marrow signal is preserved. Sinuses/Orbits: Minor mucosal thickening.  Orbits are unremarkable. Other: Sella is unremarkable.  Mastoid air cells are clear. IMPRESSION: No evidence of recent infarction, hemorrhage, or mass. Chronic cerebellar infarcts. Electronically Signed   By: Macy Mis M.D.   On: 03/28/2020 13:55   VAS [place] ABI WITH/WO TBI  Result Date: 03/28/2020 LOWER EXTREMITY DOPPLER STUDY Indications: Paresthesia. High Risk Factors: Hyperlipidemia.  Comparison Study: No prior studies. Performing Technologist: Carlos Levering RVT  Examination Guidelines: A complete evaluation includes at minimum, Doppler waveform signals and systolic blood pressure reading at the level of bilateral  brachial, anterior tibial, and posterior tibial arteries, when vessel segments are accessible. Bilateral testing is considered an integral part of a complete examination. Photoelectric Plethysmograph (PPG) waveforms and toe systolic pressure readings are included as required and additional duplex testing as needed. Limited examinations for reoccurring indications may be performed as noted.  ABI Findings: +---------+------------------+-----+---------+--------+ Right    Rt Pressure (mmHg)IndexWaveform Comment  +---------+------------------+-----+---------+--------+ Brachial 83                     triphasic         +---------+------------------+-----+---------+--------+ PTA      70                0.84 biphasic          +---------+------------------+-----+---------+--------+ DP       77                0.93 biphasic          +---------+------------------+-----+---------+--------+ Great Toe0                 0.00                   +---------+------------------+-----+---------+--------+ +---------+------------------+-----+---------+-------+ Left     Lt Pressure (mmHg)IndexWaveform Comment  +---------+------------------+-----+---------+-------+ Brachial 74                     triphasic        +---------+------------------+-----+---------+-------+ PTA      78                0.94 biphasic         +---------+------------------+-----+---------+-------+ DP       80                0.96 biphasic         +---------+------------------+-----+---------+-------+ Great Toe0                 0.00                  +---------+------------------+-----+---------+-------+ +-------+-----------+-----------+------------+------------+ ABI/TBIToday's ABIToday's TBIPrevious ABIPrevious TBI +-------+-----------+-----------+------------+------------+ Right  0.93       0                                   +-------+-----------+-----------+------------+------------+ Left   0.96       0                                   +-------+-----------+-----------+------------+------------+  Summary: Right: Resting right ankle-brachial index indicates mild right lower extremity arterial disease. Unable to obtain TBI due to low amplitude waveforms. Left: Resting left ankle-brachial index is within normal range. No evidence of significant left lower extremity arterial disease. Unable to obtain TBI due to low amplitude waveforms.  *See table(s) above for measurements and observations.  Electronically signed by Jamelle Haring on 03/28/2020 at 5:39:08 PM.    Final    ECHOCARDIOGRAM COMPLETE  Result Date: 03/29/2020    ECHOCARDIOGRAM REPORT   Patient Name:   [name] Date of Exam: 03/29/2020 Medical Rec #:  [id number]            Height:       75.0 in Accession #:    [id number]           Weight:       177.2 lb  Date of Birth:  1975/05/26             BSA:          2.085 m Patient Age:    57 years             BP:           107/65 mmHg Patient Gender: M                    HR:           54 bpm. Exam Location:  Inpatient Procedure: 2D Echo, Cardiac Doppler, Color Doppler and Intracardiac             Opacification Agent Indications:    Stroke  History:        Patient has prior history of Echocardiogram examinations, most                 recent 11/30/2019. CHF and Cardiomyopathy, CAD and Previous                 Myocardial Infarction, Abnormal ECG and Defibrillator,                 Signs/Symptoms:Bacteremia; Risk Factors:Current Smoker and                 Dyslipidemia. IVDU. Left atrial appendage thrombus.  Sonographer:    Roseanna Rainbow RDCS Referring Phys: [id number] [place]  1. Left ventricular ejection fraction, by estimation, is <20%. The left ventricle has severely decreased function. The left ventricle demonstrates global hypokinesis. The left ventricular internal cavity size was severely dilated. Left ventricular diastolic parameters are consistent with Grade II diastolic dysfunction (pseudonormalization).  2. Right ventricular systolic function is normal. The right ventricular size is normal. There is mildly elevated pulmonary artery systolic pressure. The estimated right ventricular systolic pressure is 29.4 mmHg.  3. Left atrial size was moderately dilated.  4. The mitral valve is normal in structure. Mild mitral valve regurgitation. No evidence of mitral stenosis.  5. The tricuspid valve is myxomatous. Tricuspid valve regurgitation is moderate.  6. The aortic valve is normal in structure. Aortic valve regurgitation is not visualized. No aortic stenosis is present.  7. The inferior vena cava is normal in size with greater than 50% respiratory variability, suggesting right atrial pressure of 3 mmHg. Comparison(s): No current evidence of mural thrombus. Conclusion(s)/Recommendation(s): No intracardiac source of embolism detected on this transthoracic study. A transesophageal echocardiogram is recommended to exclude cardiac source of embolism if clinically indicated. FINDINGS  Left Ventricle: Left ventricular ejection fraction, by estimation, is <20%. The left ventricle has severely  decreased function. The left ventricle demonstrates global hypokinesis. The left ventricular internal cavity size was severely dilated. There is no left ventricular hypertrophy. Left ventricular diastolic parameters are consistent with Grade II diastolic dysfunction (pseudonormalization).  LV Wall Scoring: The entire anterior wall, entire septum, entire apex, entire inferior wall, and mid anterolateral segment are akinetic. The basal anterolateral segment is hypokinetic. Right Ventricle: The right ventricular size is normal. No increase in right ventricular wall thickness. Right ventricular systolic function is normal. There is mildly elevated pulmonary artery systolic pressure. The tricuspid regurgitant velocity is 2.74  m/s, and with an assumed right atrial pressure of 8 mmHg, the estimated right ventricular systolic pressure is 76.5 mmHg. Left Atrium: Left atrial size was moderately dilated. Right Atrium: Right atrial size was normal in size. Pericardium: There is no evidence of pericardial effusion. Mitral Valve:  The mitral valve is normal in structure. Mild mitral valve regurgitation. No evidence of mitral valve stenosis. Tricuspid Valve: The tricuspid valve is myxomatous. Tricuspid valve regurgitation is moderate . No evidence of tricuspid stenosis. Aortic Valve: The aortic valve is normal in structure. Aortic valve regurgitation is not visualized. No aortic stenosis is present. Pulmonic Valve: The pulmonic valve was normal in structure. Pulmonic valve regurgitation is not visualized. No evidence of pulmonic stenosis. Aorta: The aortic root is normal in size and structure. Venous: The inferior vena cava is normal in size with greater than 50% respiratory variability, suggesting right atrial pressure of 3 mmHg. IAS/Shunts: No atrial level shunt detected by color flow Doppler.  LEFT VENTRICLE PLAX 2D LVIDd:         7.20 cm      Diastology LVIDs:         6.00 cm      LV e' medial:    4.62 cm/s LV PW:         1.10  cm      LV E/e' medial:  11.6 LV IVS:        0.80 cm      LV e' lateral:   5.35 cm/s                             LV E/e' lateral: 10.0  LV Volumes (MOD) LV vol d, MOD A2C: 333.0 ml LV vol d, MOD A4C: 248.0 ml LV vol s, MOD A2C: 249.0 ml LV vol s, MOD A4C: 201.0 ml LV SV MOD A2C:     84.0 ml LV SV MOD A4C:     248.0 ml LV SV MOD BP:      60.4 ml RIGHT VENTRICLE            IVC RV S prime:     7.14 cm/s  IVC diam: 2.00 cm TAPSE (M-mode): 1.8 cm LEFT ATRIUM             Index       RIGHT ATRIUM           Index LA diam:        4.80 cm 2.30 cm/m  RA Area:     18.00 cm LA Vol (A2C):   56.4 ml 27.06 ml/m RA Volume:   56.90 ml  27.30 ml/m LA Vol (A4C):   52.6 ml 25.23 ml/m LA Biplane Vol: 57.0 ml 27.34 ml/m  AORTIC VALVE LVOT Vmax:   95.10 cm/s LVOT Vmean:  72.400 cm/s LVOT VTI:    0.177 m  AORTA Ao Root diam: 3.20 cm MITRAL VALVE               TRICUSPID VALVE MV Area (PHT): 3.17 cm    TR Peak grad:   30.0 mmHg MV Decel Time: 240 msec    TR Vmax:        274.00 cm/s MV E velocity: 53.45 cm/s MV A velocity: 32.70 cm/s  SHUNTS MV E/A ratio:  1.63        Systemic VTI: 0.18 m Candee Furbish MD Electronically signed by Candee Furbish MD Signature Date/Time: 03/29/2020/3:09:08 PM    Final      Discharge Instructions: Discharge Instructions    Ambulatory referral to Physical Therapy   Complete by: As directed    Diet - low sodium heart healthy   Complete by: As directed    Diet - low sodium heart healthy  Complete by: As directed    Increase activity slowly   Complete by: As directed    Increase activity slowly   Complete by: As directed      [name], It was a pleasure taking care of you at [place] were admitted for left leg numbness which we think was likely a mini stroke. All the scans did not show any serious strokes. We are discharging you home now that your symptoms have improved. Please follow the following instructions.  1) Someone from our clinic will call you to set up an appointment  with [place] for Tuesday 2) Go to your cardiology appointment on Tuesday at 11 am 3) Pick up your Suboxone at [facility] on [address] 4) Continue taking your medications as prescribed  Take care,  Dr. Linwood Dibbles, MD, MPH  Signed: Lacinda Axon, MD 03/30/2020, 8:26 PM   Pager: [phone]

## 2020-03-30 NOTE — TOC Initial Note (Signed)
Transition of Care [facility]) - Initial/Assessment Note    Patient Details  Name: John Davenport MRN: [id number] Date of Birth: 07-Feb-1976  Transition of Care [facility]) CM/SW Contact:    Curlene Labrum, RN Phone Number: 03/30/2020, 9:59 AM  Clinical Narrative:                 Case management met with the patient and mother at the bedside regarding transitions of care.  The patient was agreeable to Outpatient PT and referral sent.  The patient needs a can and [employer] was called to delivered the cane to the room prior to discharge.  The patient states that his PCP is [facility].  No other needs by [facility] team.  Expected Discharge Plan: OP Rehab Barriers to Discharge: No Barriers Identified   Patient Goals and CMS Choice Patient states their goals for this hospitalization and ongoing recovery are:: Patient plans to discharge home with family today. CMS Medicare.gov Compare Post Acute Care list provided to:: Patient Choice offered to / list presented to : Patient  Expected Discharge Plan and Services Expected Discharge Plan: OP Rehab   Discharge Planning Services: CM Consult Post Acute Care Choice: Durable Medical Equipment Living arrangements for the past 2 months: Single Family Home                 DME Arranged: Kasandra Knudsen DME Agency: [employer] Date DME Agency Contacted: 03/30/20 Time DME Agency Contacted: [phone] Representative spoke with at DME Agency: Vikki Ports, [employer]            Prior Living Arrangements/Services Living arrangements for the past 2 months: [place] Lives with:: Parents Patient language and need for interpreter reviewed:: Yes Do you feel safe going back to the place where you live?: Yes      Need for Family Participation in Patient Care: Yes (Comment) Care giver support system in place?: Yes (comment)   Criminal Activity/Legal Involvement Pertinent to Current Situation/Hospitalization: No - Comment as needed  Activities of Daily  Living Home Assistive Devices/Equipment: None ADL Screening (condition at time of admission) Patient's cognitive ability adequate to safely complete daily activities?: Yes Is the patient deaf or have difficulty hearing?: No Does the patient have difficulty seeing, even when wearing glasses/contacts?: No Does the patient have difficulty concentrating, remembering, or making decisions?: No Patient able to express need for assistance with ADLs?: Yes Does the patient have difficulty dressing or bathing?: No Independently performs ADLs?: Yes (appropriate for developmental age) Does the patient have difficulty walking or climbing stairs?: Yes Weakness of Legs: Left Weakness of Arms/Hands: Left  Permission Sought/Granted Permission sought to share information with : Case Manager Permission granted to share information with : Yes, Verbal Permission Granted     Permission granted to share info w AGENCY: Outpatient PT/OT set up, [employer] for dme  Permission granted to share info w Relationship: mother Wilburn Cornelia - [phone]     Emotional Assessment Appearance:: Appears stated age Attitude/Demeanor/Rapport: Gracious Affect (typically observed): Accepting Orientation: : Oriented to Self,Oriented to Place,Oriented to  Time,Oriented to Situation Alcohol / Substance Use: Not Applicable Psych Involvement: No (comment)  Admission diagnosis:  Numbness [R20.0] AKI (acute kidney injury) ([place]) [N17.9] Lower extremity weakness [R29.898] Patient Active Problem List   Diagnosis Date Noted  . Lower extremity weakness 03/29/2020  . TIA (transient ischemic attack) 03/28/2020  . History of hepatitis C virus infection 07/15/2018  . Opioid use disorder, severe, in early remission ([place]) 07/13/2018  . Cerebrovascular accident (CVA) due  to thrombosis of left middle cerebral artery (HCC) s/p IV tPA and mechanical thrombectomy   . Chronic systolic congestive heart failure ([place])   . CKD (chronic kidney disease),  stage III ([place]) 03/09/2016  . 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 Tobacco abuse 09/05/2015  . Hyperlipidemia 09/05/2015  . Generalized anxiety disorder 05/16/2009  . CAD in native artery 12/19/2008   PCP:  Patient, No Pcp Per Pharmacy:   [place], [address] [place] [address] Phone: [phone] Fax: [place], [place] [address] [place] [address] Phone: [phone] Fax: [place] [address] [place] [address] [place] [place] [address] Phone: [phone] Fax: [phone]     Social Determinants of Health (SDOH) Interventions    Readmission Risk Interventions Readmission Risk Prevention Plan 03/30/2020  Transportation Screening Complete  PCP or Specialist Appt within 3-5 Days Complete  HRI or [place] Complete  Social Work Consult for [place] Planning/Counseling Complete  Palliative Care Screening Complete  Medication Review Press photographer) Complete  Some recent data might be hidden

## 2020-03-30 NOTE — Progress Notes (Signed)
Discharge package printed and instructions given to patient. Patient verbalizes understanding. D/C home with cane.

## 2020-03-30 NOTE — Discharge Instructions (Signed)
Mr. Slape, It was a pleasure taking care of you at [facility]. You were admitted for left leg numbness which we think was likely a mini stroke. All the scans did not show any serious strokes. We are discharging you home now that your symptoms have improved. Please follow the following instructions.  1) Someone from our clinic will call you to set up an appointment with [place] for Tuesday 2) Go to your cardiology appointment on Tuesday at 11 am 3) Pick up your Suboxone at [facility] on [address] 4) Continue taking your medications as prescribed  Take care,  Dr. Sharrell Ku, MD, MPH

## 2020-03-30 NOTE — Progress Notes (Signed)
HD#1 Subjective:  Overnight Events:  This morning, patient was evaluated at the bedside laying in bed comfortably.  Patient states his left foot numbness has improved significantly.  States she was able to walk far with PT yesterday.  Patient denies any pain, weakness or tingling.  Patient agreeable for discharge today.  States his mother will come pick him up.  He has an appointment with heart and vascular next Tuesday however would like to have appointment with [facility] on the same day due to transportation issues.  Objective:  Vital signs in last 24 hours: Vitals:   03/29/20 0752 03/29/20 1711 03/29/20 2029 03/30/20 0903  BP: 107/65 (!) 91/55 103/66 114/73  Pulse: 62 (!) 54 (!) 50 (!) 56  Resp: 20 18 18 17   Temp: 98.1 F (36.7 C) 98.6 F (37 C) 97.9 F (36.6 C) (!) 97.5 F (36.4 C)  TempSrc: Oral Oral Oral Oral  SpO2: 98% 98% 99% 97%  Weight:       Supplemental O2: Room Air SpO2: 97 % O2 Flow Rate (L/min): 98 L/min   Physical Exam:  Physical Exam Constitutional:      Appearance: Normal appearance.  Eyes:     Extraocular Movements: Extraocular movements intact.     Conjunctiva/sclera: Conjunctivae normal.  Cardiovascular:     Rate and Rhythm: Normal rate and regular rhythm.     Pulses: Normal pulses.  Pulmonary:     Effort: Pulmonary effort is normal.  Abdominal:     Palpations: Abdomen is soft.  Musculoskeletal:        General: Normal range of motion.     Right lower leg: No edema.     Left lower leg: No edema.  Skin:    General: Skin is warm and dry.  Neurological:     General: No focal deficit present.     Mental Status: He is alert.     Comments: Patient has intact sensation on the left dorsal and plantar surface of the foot.  Neurovascularly intact in both lower extremities.  Psychiatric:        Mood and Affect: Mood normal.     Filed Weights   03/29/20 0500  Weight: 80.4 kg    No intake or output data in the 24 hours ending 03/30/20 1934 Net IO  Since Admission: -70 mL [03/30/20 1934]  Recent Labs    03/28/20 1222 03/28/20 1918  GLUCAP 97 123*     Pertinent Labs: CBC Latest Ref Rng & Units 03/29/2020 03/28/2020 03/28/2020  WBC 4.0 - 10.5 K/uL 10.8(H) 14.7(H) 13.8(H)  Hemoglobin 13.0 - 17.0 g/dL 12.7(L) 13.1 12.8(L)  Hematocrit 39.0 - 52.0 % 42.7 45.2 41.5  Platelets 150 - 400 K/uL 169 215 214    CMP Latest Ref Rng & Units 03/30/2020 03/29/2020 03/28/2020  Glucose 70 - 99 mg/dL 92 03/30/2020) 016(W)  BUN 6 - 20 mg/dL 10(X) 32(T) 55(D)  Creatinine 0.61 - 1.24 mg/dL 32(K) 0.25(K) 2.70(W)  Sodium 135 - 145 mmol/L 137 138 138  Potassium 3.5 - 5.1 mmol/L 4.2 4.3 4.9  Chloride 98 - 111 mmol/L 97(L) 104 102  CO2 22 - 32 mmol/L 29 27 22   Calcium 8.9 - 10.3 mg/dL 9.4 2.37(S) 9.0  Total Protein 6.5 - 8.1 g/dL - 7.0 )  Total Bilirubin 0.3 - 1.2 mg/dL - 2.8(B) 0.4  Alkaline Phos 38 - 126 U/L - 62 71  AST 15 - 41 U/L - 28 33  ALT 0 - 44 U/L - 23 27  Imaging: MR ANGIO HEAD WO CONTRAST  FINDINGS Anterior circulation: Patent ICAs. Patent anterior and middle cerebral arteries. No high-grade stenosis, proximal occlusion, aneurysm, or vascular malformation. Posterior circulation: Codominant vertebral arteries. Patent bilateral V4 segments and PICA. The basilar and superior cerebellar arteries are patent. Patent PCAs with fetal origin on the right. No high-grade stenosis, proximal occlusion, aneurysm, or vascular malformation. Anatomic variants: Please see above. IMPRESSION: No large vessel occlusion, high-grade narrowing, dissection or aneurysm. Electronically Signed   By: Primitivo Gauze M.D.   On: 03/29/2020 14:05   MR ANGIO NECK WO CONTRAST  FINDINGS Anterior circulation: Patent ICAs. Patent anterior and middle cerebral arteries. No high-grade stenosis, proximal occlusion, aneurysm, or vascular malformation. Posterior circulation: Codominant vertebral arteries. Patent bilateral V4 segments and PICA. The basilar and superior  cerebellar arteries are patent. Patent PCAs with fetal origin on the right. No high-grade stenosis, proximal occlusion, aneurysm, or vascular malformation. Anatomic variants: Please see above. IMPRESSION: No large vessel occlusion, high-grade narrowing, dissection or aneurysm. Electronically Signed   By: Primitivo Gauze M.D.   On: 03/29/2020 14:05   ECHOCARDIOGRAM COMPLETE  IMPRESSIONS  1. Left ventricular ejection fraction, by estimation, is <20%. The left ventricle has severely decreased function. The left ventricle demonstrates global hypokinesis. The left ventricular internal cavity size was severely dilated. Left ventricular diastolic parameters are consistent with Grade II diastolic dysfunction (pseudonormalization).  2. Right ventricular systolic function is normal. The right ventricular size is normal. There is mildly elevated pulmonary artery systolic pressure. The estimated right ventricular systolic pressure is 99991111 mmHg.  3. Left atrial size was moderately dilated.  4. The mitral valve is normal in structure. Mild mitral valve regurgitation. No evidence of mitral stenosis.  5. The tricuspid valve is myxomatous. Tricuspid valve regurgitation is moderate.  6. The aortic valve is normal in structure. Aortic valve regurgitation is not visualized. No aortic stenosis is present.  7. The inferior vena cava is normal in size with greater than 50% respiratory variability, suggesting right atrial pressure of 3 mmHg.    Assessment/Plan:   Principal Problem:   TIA (transient ischemic attack) Active Problems:   Chronic systolic congestive heart failure (HCC)   Opioid use disorder, severe, in early remission ([place])   Lower extremity weakness   Patient Summary: John Davenport is a 44 y.o. 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   Plan for discharge today.  #Transient Ischemic Attack: #Left lower extremity numbness and tingling  #Sensory mononeuropathy #Paresthesias of left lower extremity Patient presented with left lower extremity mononeuropathy with paresthesias over the dorsal foot and numbness over the plantar aspect.  Patient symptoms has resolved.  He now has intact sensation on the bottom of the left foot.  Able to ambulate with no issues.  Plan for discharge today.  #AKI, resolving Patient's AKI has improved since admission.  Creatinine down to 1.3 today.  Plan to resume home Entresto and spironolactone on discharge.  #Poly-substance use disorder Patient has a history of IV drug use.  Last use of heroin was in September 2021.  Reports getting Suboxone on the streets.  Received Suboxone during admission due to risk of withdrawal.  Sending home with a Suboxone prescription and plan to follow-up with [facility] clinic to establish care and continue monitored Suboxone treatment.  #HFrEF Patient euvolemic on exam.  Restart guideline directed medical therapy on discharge.  #CAD --Continue rosuvastatin 20 mg daily and Xarelto 20 mg daily  #Hypotension, resolved. BP stable today at 114/73.  Will resume home medications at discharge today.  #Normocytic anemia: Patient has a anemia with hemoglobin of 12.7 and an RDW of 15.9 and MCV of 89.3 and mildly low ferritin.  No history of colonoscopy on  file.  Patient will require B12/iron supplementation. Will likely require outpatient referral for colonoscopy.  #HLD #HTN #CVA Resume all home meds.  Diet: Cardiac diet IVF: PO intake VTE:  Code: Full PT/OT: Home Health  Anticipated discharge to Home in 1 days pending completion of TIA work-up.  Lacinda Axon, MD 03/30/2020, 7:46 PM Pager: [phone] Internal Medicine Teaching Service   7:34 PM, 03/30/2020   Please contact the on call pager after 5 pm and on weekends at [phone].

## 2020-04-02 ENCOUNTER — Telehealth: Payer: Self-pay | Admitting: Internal Medicine

## 2020-04-02 NOTE — Telephone Encounter (Signed)
APPT [facility] FOR HFU WITH DR COE ON 04/04/2019 @ 2:45 PM PER DR AMPONSAH

## 2020-04-03 ENCOUNTER — Other Ambulatory Visit: Payer: Self-pay

## 2020-04-03 ENCOUNTER — Ambulatory Visit (HOSPITAL_COMMUNITY)
Admission: RE | Admit: 2020-04-03 | Discharge: 2020-04-03 | Disposition: A | Discharge status: Home / Self Care | Payer: Medicare Other | Source: Ambulatory Visit | Attending: Family Medicine | Admitting: Family Medicine

## 2020-04-03 ENCOUNTER — Ambulatory Visit (HOSPITAL_COMMUNITY)
Admission: RE | Admit: 2020-04-03 | Discharge: 2020-04-03 | Disposition: A | Discharge status: Home / Self Care | Payer: Medicare Other | Source: Ambulatory Visit | Attending: Cardiology | Admitting: Cardiology

## 2020-04-03 ENCOUNTER — Encounter: Payer: Self-pay | Admitting: Internal Medicine

## 2020-04-03 ENCOUNTER — Ambulatory Visit (INDEPENDENT_AMBULATORY_CARE_PROVIDER_SITE_OTHER): Payer: Medicare Other | Admitting: Internal Medicine

## 2020-04-03 VITALS — BP 122/64 | HR 63 | Wt 177.2 lb

## 2020-04-03 VITALS — BP 87/49 | HR 63 | Temp 98.0°F | Ht 75.0 in | Wt 175.8 lb

## 2020-04-03 DIAGNOSIS — Z79899 Other long term (current) drug therapy: Secondary | ICD-10-CM | POA: Insufficient documentation

## 2020-04-03 DIAGNOSIS — I952 Hypotension due to drugs: Secondary | ICD-10-CM

## 2020-04-03 DIAGNOSIS — Z7984 Long term (current) use of oral hypoglycemic drugs: Secondary | ICD-10-CM | POA: Insufficient documentation

## 2020-04-03 DIAGNOSIS — N1831 Chronic kidney disease, stage 3a: Secondary | ICD-10-CM

## 2020-04-03 DIAGNOSIS — R001 Bradycardia, unspecified: Secondary | ICD-10-CM

## 2020-04-03 DIAGNOSIS — N189 Chronic kidney disease, unspecified: Secondary | ICD-10-CM | POA: Diagnosis not present

## 2020-04-03 DIAGNOSIS — R7989 Other specified abnormal findings of blood chemistry: Secondary | ICD-10-CM | POA: Insufficient documentation

## 2020-04-03 DIAGNOSIS — F411 Generalized anxiety disorder: Secondary | ICD-10-CM | POA: Diagnosis not present

## 2020-04-03 DIAGNOSIS — I252 Old myocardial infarction: Secondary | ICD-10-CM | POA: Insufficient documentation

## 2020-04-03 DIAGNOSIS — Z955 Presence of coronary angioplasty implant and graft: Secondary | ICD-10-CM | POA: Diagnosis not present

## 2020-04-03 DIAGNOSIS — I2510 Atherosclerotic heart disease of native coronary artery without angina pectoris: Secondary | ICD-10-CM | POA: Diagnosis not present

## 2020-04-03 DIAGNOSIS — F1121 Opioid dependence, in remission: Secondary | ICD-10-CM | POA: Diagnosis not present

## 2020-04-03 DIAGNOSIS — I5022 Chronic systolic (congestive) heart failure: Secondary | ICD-10-CM | POA: Insufficient documentation

## 2020-04-03 DIAGNOSIS — G459 Transient cerebral ischemic attack, unspecified: Secondary | ICD-10-CM

## 2020-04-03 DIAGNOSIS — F17210 Nicotine dependence, cigarettes, uncomplicated: Secondary | ICD-10-CM | POA: Insufficient documentation

## 2020-04-03 DIAGNOSIS — Z720 Tobacco use: Secondary | ICD-10-CM

## 2020-04-03 DIAGNOSIS — I255 Ischemic cardiomyopathy: Secondary | ICD-10-CM | POA: Insufficient documentation

## 2020-04-03 DIAGNOSIS — F1911 Other psychoactive substance abuse, in remission: Secondary | ICD-10-CM | POA: Insufficient documentation

## 2020-04-03 DIAGNOSIS — I472 Ventricular tachycardia, unspecified: Secondary | ICD-10-CM

## 2020-04-03 DIAGNOSIS — I4729 Other ventricular tachycardia: Secondary | ICD-10-CM

## 2020-04-03 DIAGNOSIS — J449 Chronic obstructive pulmonary disease, unspecified: Secondary | ICD-10-CM | POA: Insufficient documentation

## 2020-04-03 DIAGNOSIS — Z951 Presence of aortocoronary bypass graft: Secondary | ICD-10-CM | POA: Insufficient documentation

## 2020-04-03 DIAGNOSIS — Z8673 Personal history of transient ischemic attack (TIA), and cerebral infarction without residual deficits: Secondary | ICD-10-CM | POA: Insufficient documentation

## 2020-04-03 NOTE — Patient Instructions (Addendum)
It was a pleasure seeing you today!  MEDICATIONS: -No medication changes today -Call if you have questions about your medications.   NEXT APPOINTMENT: Return to clinic in 3 weeks with [facility].  In general, to take care of your heart failure: -Limit your fluid intake to 2 Liters (half-gallon) per day.   -Limit your salt intake to ideally 2-3 grams (2000-3000 mg) per day. -Weigh yourself daily and record, and bring that "weight diary" to your next appointment.  (Weight gain of 2-3 pounds in 1 day typically means fluid weight.) -The medications for your heart are to help your heart and help you live longer.   -Please contact us before stopping any of your heart medications.  Call the clinic at [phone] with questions or to reschedule future appointments.  

## 2020-04-03 NOTE — Progress Notes (Signed)
Internal Medicine Clinic Attending  Case discussed with Dr. Coe  At the time of the visit.  We reviewed the resident's history and exam and pertinent patient test results.  I agree with the assessment, diagnosis, and plan of care documented in the resident's note.  

## 2020-04-04 ENCOUNTER — Encounter: Payer: Self-pay | Admitting: Internal Medicine

## 2020-04-04 LAB — BMP8+ANION GAP
Anion Gap: 16 mmol/L (ref 10.0–18.0)
BUN/Creatinine Ratio: 22 — ABNORMAL HIGH (ref 9–20)
BUN: 29 mg/dL — ABNORMAL HIGH (ref 6–24)
CO2: 25 mmol/L (ref 20–29)
Calcium: 9.5 mg/dL (ref 8.7–10.2)
Chloride: 98 mmol/L (ref 96–106)
Creatinine, Ser: 1.29 mg/dL — ABNORMAL HIGH (ref 0.76–1.27)
GFR calc Af Amer: 76 mL/min/{1.73_m2} (ref >59)
GFR calc non Af Amer: 66 mL/min/{1.73_m2} (ref >59)
Glucose: 41 mg/dL — ABNORMAL LOW (ref 65–99)
Potassium: 4.8 mmol/L (ref 3.5–5.2)
Sodium: 139 mmol/L (ref 134–144)

## 2020-04-04 LAB — MAGNESIUM: Magnesium: 2.2 mg/dL (ref 1.6–2.3)

## 2020-04-04 NOTE — Assessment & Plan Note (Signed)
Patient was recently discharged from the hospital with presumed TIA.  Think symptoms were numbness on the plantar aspect of his foot with paresthesias over the dorsal aspect on his left foot.  He also had some paresthesias of his left hand.  He states that almost all of his symptoms have resolved but does endorse some pain in his left foot that started after his numbness and paraesthesias had resolved.  Patient was continued on his Xarelto 20 mg in the hospital and discharged on this dose. He is tolerating this medication well without any significant side effects or obvious signs of bleeding.  His TIA thought to be embolic in nature and secondary to his ischemic cardiomyopathy.  Patient does have a history of embolic CVA in the same setting in 2018.    Plan: -Continue Xarelto 20 mg daily.

## 2020-04-04 NOTE — Patient Instructions (Signed)
Unfortunately, patient was allowed to leave prior to finishing his clinic appointment. Therefore, he did not receive an AVS.

## 2020-04-04 NOTE — Assessment & Plan Note (Signed)
Patient presents with hypotension of 87/49 without symptoms.  His hypotension is secondary to polypharmacy.  Therefore, I will discontinue his carvedilol today and reassess his blood pressure in 1 week.

## 2020-04-04 NOTE — Assessment & Plan Note (Signed)
Patient has a history of paroxysmal ventricular tachycardia secondary to ischemic cardiomyopathy with heart failure with severely decreased ejection fraction.  He is status post ICD removal secondary to MSSA vegetation.  Cardiology does not intend to perform a second ICD implantation.  He is currently taking amiodarone 200 mg daily without side effects.  Patient will need periodic monitoring of liver enzymes, pulmonary function (with PFTs yearly, CXR's every 3 to 6 months or if symptoms of fibrosis arise), thyroid function, and yearly eye exams.   Plan: -Continue amiodarone 200 mg daily -Ophthalmology referral for eye exam

## 2020-04-04 NOTE — Progress Notes (Signed)
CC: Heart Failure  HPI:  John Davenport is a 45 y.o. 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   Past Medical History:  Diagnosis Date  . Anxiety state, unspecified   . Back pain   . Cancer (HCC)    Lung CA  . Chronic systolic heart failure (HCC)    a. Ischemic CM (EF 30% by echo in 2011) b. echo 08/2015: EF 20% with severe HK and AK of the inferoseptal and apical myocardium.  . Coronary artery disease    a. 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  b. eventually required single vessel CABG (L-LAD) in 03/2009 due to restenosis. c. cath 2013 showed patent LIMA sequential to the diags with LAD filling retrograde, no new disease in RCA and Cx, low LVEDP, sx felt noncardiac. d. 08/2015: NSTEMI: CTO of prox LAD, 99% stenosis of small dCx (no stent), + for cocaine  . Esophageal reflux   . Heart attack (HCC)   . History of hepatitis C virus infection 07/15/2018   07/20/18 Viral RNA negative suggest past, cleared infection  . Hypercholesteremia   . ICD (implantable cardioverter-defibrillator) infection (HCC)    a. [place] Sci ICD 12/2008.   . Ischemic cardiomyopathy    echocardiogram 12/11: Mild LVH, EF 30-35%, anteroseptal and apical akinesis, grade 1 diastolic dysfunction  . LBP (low back pain)   . OSA (obstructive  sleep apnea) 12/30/2010  . Paroxysmal ventricular tachycardia (HCC)   . Stroke (HCC)   . Tobacco abuse     Current Outpatient Medications on File Prior to Visit  Medication Sig Dispense Refill  . amiodarone (PACERONE) 200 MG tablet Take 1 tablet (200 mg total) by mouth daily. 90 tablet 3  . buprenorphine-naloxone (SUBOXONE) 8-2 mg SUBL SL tablet Place 1 tablet under the tongue 2 (two) times daily. 28 tablet 0  . carvedilol (COREG) 3.125 MG tablet Take 1 tablet (3.125 mg total) by mouth 2 (two) times daily. 180 tablet 3  . dapagliflozin propanediol (FARXIGA) 10 MG TABS tablet Take 1 tablet (10 mg total) by mouth daily. 90 tablet 3  . digoxin (LANOXIN) 0.125 MG tablet Take 1 tablet (0.125 mg total) by mouth daily. 90 tablet 3  . furosemide (LASIX) 40 MG tablet Take 1 tablet (40 mg total) by mouth 2 (two) times daily. With extra 40 mg as needed for 3 pound weight gain. 270 tablet 3  . rivaroxaban (XARELTO) 20 MG TABS tablet Take 1 tablet (20 mg total) by mouth daily with supper. 90 tablet 6  . rosuvastatin (CRESTOR) 20 MG tablet Take 1 tablet (20 mg total) by mouth daily. 30 tablet 6  . sacubitril-valsartan (ENTRESTO) 24-26 MG Take 1 tablet by mouth 2 (two) times daily. 180 tablet 3  . spironolactone (ALDACTONE) 25 MG tablet Take 1 tablet (25 mg total) by mouth daily. 90  tablet 3   No current facility-administered medications on file prior to visit.    Family History  Problem Relation Age of Onset  . Heart attack Father 4       died at 59 after multiple MI's  . COPD Maternal Grandmother     Social History   Socioeconomic History  . Marital status: Single    Spouse name: Not on file  . Number of children: 1  . Years of education: Not on file  . Highest education level: Not on file  Occupational History  . Occupation: disabled    Fish farm manager: UNEMPLOYED  Tobacco Use  . Smoking status: Current Every Day Smoker    Packs/day: 0.50    Years: 20.00    Pack years: 10.00    Types:  Cigarettes  . Smokeless tobacco: Former Systems developer    Types: Chew  . Tobacco comment: smoke 0.5 per week  Vaping Use  . Vaping Use: Never used  Substance and Sexual Activity  . Alcohol use: No  . Drug use: Yes    Types: Cocaine  . Sexual activity: Not on file  Other Topics Concern  . Not on file  Social History Narrative   Pt lives with his mom, brother, and 60 yo son in [place].   Quit tobacco in Jan. 2011 prior to heart surgery (prior -1/2 PPD (down from 1 ppd per day, onset age 71)    alcohol abuse- prior, hasn't drank since age 88   no illicit drug use    Currently unemployed, awaiting disability   Single         Social Determinants of Health   Financial Resource Strain: Not on file  Food Insecurity: Not on file  Transportation Needs: Not on file  Physical Activity: Not on file  Stress: Not on file  Social Connections: Not on file  Intimate Partner Violence: Not on file    Review of Systems: ROS negative except for what is noted on the assessment and plan.  Vitals:   04/03/20 1450  BP: (!) 87/49  Pulse: 63  Temp: 98 F (36.7 C)  TempSrc: Oral  SpO2: 98%  Weight: 175 lb 12.8 oz (79.7 kg)  Height: 6\' 3"  (1.905 m)     Physical Exam: Physical Exam Constitutional:      Appearance: Normal appearance.  HENT:     Head: Normocephalic and atraumatic.  Eyes:     Extraocular Movements: Extraocular movements intact.  Neck:     Vascular: No hepatojugular reflux or JVD.  Cardiovascular:     Rate and Rhythm: Normal rate.     Pulses: Normal pulses.     Heart sounds: Normal heart sounds.  Pulmonary:     Effort: Pulmonary effort is normal.     Breath sounds: Normal breath sounds.  Abdominal:     General: Bowel sounds are normal.     Palpations: Abdomen is soft.     Tenderness: There is no abdominal tenderness.  Musculoskeletal:        General: Normal range of motion.     Cervical back: Normal range of motion.     Right lower leg: No edema.     Left lower  leg: No edema.  Skin:    General: Skin is warm and dry.  Neurological:     Mental Status: He is alert and oriented to person, place, and time. Mental status is at baseline.  Psychiatric:        Mood and Affect: Mood normal.  Assessment & Plan:   See Encounters Tab for problem based charting.  Patient discussed with Dr. Kelle Darting, D.O. [facility], PGY-2 Pager: [phone], Phone: [phone] Date 04/04/2020 Time 6:58 AM

## 2020-04-04 NOTE — Assessment & Plan Note (Signed)
Patient is trying to quit but will need further assistance with tobacco cessation at follow-up visits.

## 2020-04-04 NOTE — Assessment & Plan Note (Signed)
Patient has a history of opioid use disorder and was initiated on Suboxone therapy in the hospital.  Patient states that he is used Suboxone in the past and endorses improvement of his cravings and withdrawal symptoms.  On today's visit, patient states that he has not had any issues with relapse, he denies any signs or symptoms of withdrawal, and feels as though his cravings are appropriately treated.  He is currently on Suboxone 8-2 mg twice daily.   Patient unfortunately had to leave prior to finishing his Suboxone enrollment labs and screening.  I will call him to arrange a follow-up visit in 1 weeks prior to his medications running.  He will need a U tox and finish up his OU D clinic paperwork  Plan: - follow- up in 1 week for UTox and OUD paperwork.

## 2020-04-04 NOTE — Assessment & Plan Note (Signed)
Patient endorses significant anxiety secondary to his medical conditions.  He states that this is causing problems with his sleep.  He will likely need further evaluation and management of his anxiety at his follow-up appointment.  He will likely need to perform a GAD 7 survey, but clinically I think you would qualify for medication management for his anxiety as it is significantly impacting his daily life.

## 2020-04-04 NOTE — Assessment & Plan Note (Addendum)
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  Patient is currently taking carvedilol 3.125 mg twice daily, furosemide 40 mg twice daily, Entresto 24-26 mg twice daily, spironolactone 25 mg daily, dapagliflozin 10 mg daily, and digoxen 0.125 mg daily.  Patient was previously started on all of these medication he was found to be hypotensive in the hospital.  Patient remains hypertensive on today's evaluation with blood pressures 87/49.  Patient denies any symptoms such as orthostasis/presyncopal symptoms associated with his low blood pressures.    Plan: -I will discontinue his carvedilol 3.125 mg twice daily due to his hypotension -I will reach out to his cardiologist for further recommendations. - Will get a BMP and Mg today.  - Otherwise, he should continue all of he other HF medications.

## 2020-04-10 ENCOUNTER — Ambulatory Visit (INDEPENDENT_AMBULATORY_CARE_PROVIDER_SITE_OTHER): Payer: Medicare Other | Admitting: Internal Medicine

## 2020-04-10 VITALS — BP 117/77 | HR 73 | Temp 97.7°F | Wt 179.8 lb

## 2020-04-10 DIAGNOSIS — F1121 Opioid dependence, in remission: Secondary | ICD-10-CM | POA: Diagnosis present

## 2020-04-10 NOTE — Progress Notes (Signed)
   04/10/2020  John Davenport presents for follow up of opioid use disorder I have reviewed the prior induction visit, follow up visits, and telephone encounters relevant to opiate use disorder (OUD) treatment.   Current daily dose: Suboxone 8-2 mg twice daily  Date of Induction: 04/10/2020  Current follow up interval, in weeks: No follow-up at this time  The patient has not been adherent with the buprenorphine for OUD contract.   Last UDS Result: Unable to be performed today (see problem based assessment and plan for more details.)  HPI: This is a 45 y.o. old male living with OUD who presents for a follow up appointment for OUD. He admits to adherence to his medication as prescribed. He denies cravings and highlights the following triggers: none. He denies relapses since his last appointment.    Exam:   Vitals:   04/10/20 1047  BP: 117/77  Pulse: 73  Temp: 97.7 F (36.5 C)  TempSrc: Oral  SpO2: 99%  Weight: 179 lb 12.8 oz (81.6 kg)   Physical Exam Constitutional:      Appearance: Normal appearance.  HENT:     Head: Normocephalic and atraumatic.  Eyes:     Extraocular Movements: Extraocular movements intact.  Cardiovascular:     Rate and Rhythm: Normal rate.     Pulses: Normal pulses.     Heart sounds: Normal heart sounds.  Pulmonary:     Effort: Pulmonary effort is normal.     Breath sounds: Normal breath sounds.  Abdominal:     General: There is no distension.     Palpations: Abdomen is soft.     Tenderness: There is no abdominal tenderness.  Musculoskeletal:        General: Normal range of motion.     Cervical back: Normal range of motion.     Right lower leg: No edema.     Left lower leg: No edema.  Skin:    General: Skin is warm and dry.  Neurological:     Mental Status: He is alert and oriented to person, place, and time. Mental status is at baseline.  Psychiatric:        Mood and Affect: Mood normal.      Assessment/Plan:  See Problem Based  Charting in the Encounters Tab   John Davenport, D.O.  Internal Medicine Resident, PGY-2 [facility]  Pager: [phone] 7:48 AM, 04/13/2020

## 2020-04-10 NOTE — Progress Notes (Addendum)
Internal Medicine Clinic Attending  I saw and evaluated the patient.  I personally confirmed the key portions of the history and exam documented by Dr. Marianna Payment and I reviewed pertinent patient test results.  The assessment, diagnosis, and plan were formulated together and I agree with the documentation in the resident's note.  I have reviewed patient's chart and prior notes regarding his OUD. I actually met patient in the hospital when he was on our service in December prior to establishing with our clinic. He was first admitted on 03/02/20.  I saw him the following day as supervising attending and spoke with him about his use. At the time he was doing well and had not injected IV heroin in months. He was obtaining illicit suboxone and taking every 3-4 days as needed for cravings. While in the hospital, he received PRN suboxone and was offered a prescription on discharge, but he declined. Said he did not need it very often and did not need to be on it daily. He was then readmitted on 03/28/20. At that time had continued to do well with his OUD and denied relapse. Again he was given PRN suboxone while in the hospital for cravings but this time agreed to a prescription at discharge with follow up in our [place] clinic. He was seen in our resident clinic on 04/04/20 for hospital follow up. Unfortunately he had to leave the visit early prior to completion of his OUD intake and UDS.  He was brought back today to establish care in our [place] clinic and complete his OUD intake visit. Dr. Marianna Payment reviewed the rules of our suboxone contract which he agreed to. However immediately after signing the contract, he said he was unable to provide a urine sample when asked. He was given water and asked to wait but he insisted that he could not go. He asked if he come back Friday to give a urine sample which Dr. Marianna Payment explained is not appropriate. I went into the room to discuss with patient, however he stormed out saying this was "crazy" and we  should have told him he would need to provide a sample. Based on his behavior today and prior report of infrequent suboxone use, I am concerned for diversion. No prescription was given today. I would be hesitant to have patient re establish with our OUD clinic in the future. However we can continue to provide primary care services through [facility].

## 2020-04-13 ENCOUNTER — Encounter: Payer: Self-pay | Admitting: Internal Medicine

## 2020-04-13 NOTE — Assessment & Plan Note (Signed)
Patient presents today to establish care for opioid use disorder.  Patient was recently discharged from the hospital and started on buprenorphine 8- 2 mg tablets twice daily.  His last prescription was filled on 03/30/2020 for 2 weeks so that he could make it to the clinic appointment to establish care.  I reviewed the OUD clinic contract with the patient and discussed the criteria for maintaining compliance with his contract in order to receive buprenorphine therapy.  The patient admitted to understanding and signed the contract willingly.  The patient was asked to perform a urine sample during today's visit.  Patient responded by stating that he took his diuretic this morning and was unable to urinate anymore at this time.  I reviewed the contract with him and reiterated the importance of being able to obtain random urine samples to ensure compliance with buprenorphine therapy.  Patient became frustrated stating that he cannot urinate at this time states he would be willing to come back on Friday to provide a sample.  Informed patient that this would not be an appropriate and that we would need a urine sample today.  I offered the patient water and stated that he could wait a little while in the clinic to see if he can produce a sample.  The patient became agitated and stormed out of the exam room stating that we should have told him that we would need a urine sample prior to coming to this appointment.  Therefore, we will now prescribe buprenorphine today as we not confirm that the patient is appropriately taking his prescription.   Plan: -No refill for buprenorphine today -Patient can try to reestablish care at a later date if he chooses to comply with the IUD contract.

## 2020-04-19 NOTE — Telephone Encounter (Signed)
Pt was seen by Dr Marianna Payment on 04/03/20. See encounter.

## 2020-04-23 ENCOUNTER — Ambulatory Visit
Payer: Medicare Other | Attending: Student in an Organized Health Care Education/Training Program | Admitting: Physical Therapy

## 2020-04-27 NOTE — Progress Notes (Incomplete)
Advanced Heart Failure Clinic Note   PCP: Virl Axe, MD HF Cardiologist: Dr. Aundra Dubin  HPI: John Davenport is 45 y.o. M PMH: recent hospitalization for MSSA bacteremia and ICD removal, paroxysmal ventricular tachycardia, CAD s/p CABG (MI with occlusion LAD 10/9 tx w BMS, reocclusion of LAD stent 6/10 and LIMA-D1 & D2 1/11 with NSTEMI 6/17 99% stenosis small distal LCX), chronic systolic CHF secondary to ICM, COPD, CVA s/p tPA and mechanical thrombectomy (2/18) thought related to cardio-embolic nature was on xarelto, CKD, and hepatitis C.    Recent admission 12/2-12/4 for heart burn which he notes feels similar to his "blockages." He could not lay flat and had to sit up all night to help with breathing. He also had green productive cough. In the ED, labs show mild hypokalemia otherwise unremarkable (troponin & LFTS were normal; BNP 1,700 likely from being off GDMT). EKG showed SR and slight TWI in lateral leads similar to EKG 10/2019. CXR suggestive of bilateral pulmonary edema, VQ scan negative for PE. Patient appeared to strictly have left-sided symptoms. Patient was instructed to discontinue meds on discharge after last hospitalization on 11/25/19.Patient previously had ICD, but this was removed in April 2020 due to MSSA bacteremia. He was diuresed with IV lasix 40 mg on arrival and put on IV lasix 80 mg BID during admission. He was diuresed to a net of -7.4 L by day of discharge and given 500 mL IV NS before he was discharged. He was started on Entresto 24-26 PO daily, spiro 20 mg, digoxin 0.125 and Farxiga 10 mg daily. His coreg was held until discharge. Baseline Cr around 1. Crincreased to 1.55 from 1.13 on day of discharge. Likely due to over-diureses.  Discharge weight was 177.3 lbs.  Admission 11/25/19-12/02/19: Had LUE abscess drained in ED, plan for admission with IV abx but left AMA, blood cultures returned positive for Serratia and abscess culture grew klebsiella oxytoca and  serratia, patient returned and admitted to [facility] for bacteremia.  Dr. Dellis Filbert I&D LFA switched to PO abx.  TEE was negative for vegetation.   He was seen 12/21 for HF f/u. Feels good, no SOB. No complaints of swelling, CP, dizziness, orthopnea/PND.  No fever/chills.  Smoking 1 ppd, down from 2 ppd. No ETOH. No IVDU since labor day. Weights a home ~182-183 lbs. Buying Suboxone on the street, wants to be seen in internal medicine clinic to obtain medication legally. Lives with mom and does not have reliable transportation. Coreg was started at this visit.  Admitted 03/28/20-03/30/20 for left foot numbness/paresthesias and all imaging were negative for acute stroke. Symptoms were significantly with improved at discharge. He was started on Suboxone. Follow up with Opioid Use Disorder clinic resulted in his inabiity to give a urine sample and abruptly leaving. No Rx given. Per notes, patient is high risk for diversion.  1/22 he returned to HF clinic for pharmacist medication titration. Weights have been stable at home on furosemide 40 mg bid. States his BP at home is "low" but cannot remember values. No medication changes were made at this visit.  Today he returns for HF follow up. Overall feeling fine. Denies increasing SOB, CP, dizziness, edema, or PND/Orthopnea. Appetite ok. No fever or chills. Weight at home 170 pounds. Taking all medications.     TEE 9/21: EF 15%, global hypokinesis, mildly reduced right ventricular systolic function, mildly dilated left atrial, mod MR, mod TR, positive bubble study.   Cath 11/19: LAD occluded at the ostium, patent RCA and  sequential LIMA-D1&D2, ostial LCx looks improved, no change to OM1, patent OM2, with severe disease in distal LCx.  RA 8, PA 55/20 (mean 33), PCWP 20, Fick CO 4/CI1.91, PVR 3.2.   ROS: All systems reviewed and negative except as per HPI.   Past Medical History:  Diagnosis Date  . Anxiety state, unspecified   . Back pain   . Cancer ([place])     Lung CA  . Chronic systolic heart failure ([place])    a. Ischemic CM (EF 30% by echo in 2011) b. echo 08/2015: EF 20% with severe HK and AK of the inferoseptal and apical myocardium.  . Coronary artery disease    a. 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  b. eventually required single vessel CABG (L-LAD) in 03/2009 due to restenosis. c. cath 2013 showed patent LIMA sequential to the diags with LAD filling retrograde, no new disease in RCA and Cx, low LVEDP, sx felt noncardiac. d. 08/2015: NSTEMI: CTO of prox LAD, 99% stenosis of small dCx (no stent), + for cocaine  . Esophageal reflux   . Heart attack ([place])   . History of hepatitis C virus infection 07/15/2018   07/20/18 Viral RNA negative suggest past, cleared infection  . Hypercholesteremia   . ICD (implantable cardioverter-defibrillator) infection ([place])    a. [place] Sci ICD 12/2008.   . Ischemic cardiomyopathy    echocardiogram 12/11: Mild LVH, EF 30-35%, anteroseptal and apical akinesis, grade 1 diastolic dysfunction  . LBP (low back pain)   . OSA (obstructive sleep apnea) 12/30/2010  . Paroxysmal ventricular tachycardia ([place])   . Stroke ([place])   . Tobacco abuse    Past Surgical History:      Past Surgical History:  Procedure Laterality Date  . BUBBLE STUDY  11/30/2019   Procedure: BUBBLE STUDY;  Surgeon: Elouise Munroe, MD;  Location: [facility] ENDOSCOPY;  Service: Cardiology;;  . CARDIAC CATHETERIZATION N/A 09/06/2015   Procedure: Left Heart Cath and Cors/Grafts Angiography;  Surgeon: Sherren Mocha, MD;  Location: [place] CV LAB;  Service: Cardiovascular;  Laterality: N/A;  . CARDIAC DEFIBRILLATOR PLACEMENT    . CARDIAC DEFIBRILLATOR PLACEMENT     2010 by JA  . CORONARY ANGIOPLASTY WITH STENT PLACEMENT     LAD stenting 10/09 and re-opening 6/10  . CORONARY ARTERY BYPASS GRAFT     1/11  . ICD LEAD REMOVAL Left 07/16/2018   Procedure: ICD LEAD REMOVAL;  Surgeon: Evans Lance,  MD;  Location: [facility];  Service: Cardiovascular;  Laterality: Left;  [place] back up  . IR GENERIC HISTORICAL  05/21/2016   IR PERCUTANEOUS ART THROMBECTOMY/INFUSION INTRACRANIAL INC DIAG ANGIO 05/21/2016 Luanne Bras, MD [facility]-INTERV RAD  . IR GENERIC HISTORICAL  06/16/2016   IR RADIOLOGIST EVAL & MGMT 06/16/2016 [facility]  . LEFT HEART CATHETERIZATION WITH CORONARY ANGIOGRAM N/A 08/07/2011   Procedure: LEFT HEART CATHETERIZATION WITH CORONARY ANGIOGRAM;  Surgeon: Josue Hector, MD;  Location: [facility] CATH LAB;  Service: Cardiovascular;  Laterality: N/A;  . RADIOLOGY WITH ANESTHESIA N/A 05/21/2016   Procedure: RADIOLOGY WITH ANESTHESIA;  Surgeon: Luanne Bras, MD;  Location: [place];  Service: Radiology;  Laterality: N/A;  . RIGHT/LEFT HEART CATH AND CORONARY ANGIOGRAPHY N/A 02/03/2018   Procedure: RIGHT/LEFT HEART CATH AND CORONARY ANGIOGRAPHY;  Surgeon: Larey Dresser, MD;  Location: [place] CV LAB;  Service: Cardiovascular;  Laterality: N/A;  . TEE WITHOUT CARDIOVERSION N/A 07/16/2018   Procedure: TRANSESOPHAGEAL ECHOCARDIOGRAM (TEE);  Surgeon: Evans Lance, MD;  Location: [place];  Service: Cardiovascular;  Laterality: N/A;  . TEE WITHOUT CARDIOVERSION N/A 11/30/2019   Procedure: TRANSESOPHAGEAL ECHOCARDIOGRAM (TEE);  Surgeon: Elouise Munroe, MD;  Location: [place];  Service: Cardiology;  Laterality: N/A;    Family History:      Family History  Problem Relation Age of Onset  . Heart attack Father 4       died at 4 after multiple MI's  . COPD Maternal Grandmother      Current Outpatient Medications  Medication Sig Dispense Refill  . amiodarone (PACERONE) 200 MG tablet Take 1 tablet (200 mg total) by mouth daily. 90 tablet 3  . buprenorphine-naloxone (SUBOXONE) 8-2 mg SUBL SL tablet Place 1 tablet under the tongue 2 (two) times daily. 28 tablet 0  . carvedilol (COREG) 3.125 MG tablet Take 1 tablet (3.125 mg total) by mouth 2 (two) times daily. 180 tablet 3  .  dapagliflozin propanediol (FARXIGA) 10 MG TABS tablet Take 1 tablet (10 mg total) by mouth daily. 90 tablet 3  . digoxin (LANOXIN) 0.125 MG tablet Take 1 tablet (0.125 mg total) by mouth daily. 90 tablet 3  . furosemide (LASIX) 40 MG tablet Take 1 tablet (40 mg total) by mouth 2 (two) times daily. With extra 40 mg as needed for 3 pound weight gain. 270 tablet 3  . rivaroxaban (XARELTO) 20 MG TABS tablet Take 1 tablet (20 mg total) by mouth daily with supper. 90 tablet 6  . rosuvastatin (CRESTOR) 20 MG tablet Take 1 tablet (20 mg total) by mouth daily. 30 tablet 6  . sacubitril-valsartan (ENTRESTO) 24-26 MG Take 1 tablet by mouth 2 (two) times daily. 180 tablet 3  . spironolactone (ALDACTONE) 25 MG tablet Take 1 tablet (25 mg total) by mouth daily. 90 tablet 3   No current facility-administered medications for this visit.    No Known Allergies    Social History   Socioeconomic History  . Marital status: Single    Spouse name: Not on file  . Number of children: 1  . Years of education: Not on file  . Highest education level: Not on file  Occupational History  . Occupation: disabled    Fish farm manager: UNEMPLOYED  Tobacco Use  . Smoking status: Current Every Day Smoker    Packs/day: 0.50    Years: 20.00    Pack years: 10.00    Types: Cigarettes  . Smokeless tobacco: Former Systems developer    Types: Chew  . Tobacco comment: smoke 0.5 per week  Vaping Use  . Vaping Use: Never used  Substance and Sexual Activity  . Alcohol use: No  . Drug use: Yes    Types: Cocaine  . Sexual activity: Not on file  Other Topics Concern  . Not on file  Social History Narrative   Pt lives with his mom, brother, and 57 yo son in [place].   Quit tobacco in Jan. 2011 prior to heart surgery (prior -1/2 PPD (down from 1 ppd per day, onset age 35)    alcohol abuse- prior, hasn't drank since age 45   no illicit drug use    Currently unemployed, awaiting disability   Single         Social Determinants of  Health   Financial Resource Strain: Not on file  Food Insecurity: Not on file  Transportation Needs: Not on file  Physical Activity: Not on file  Stress: Not on file  Social Connections: Not on file  Intimate Partner Violence: Not on file   There were no vitals filed for this visit.  PHYSICAL EXAM: General:  NAD. No resp difficulty HEENT: Normal Neck: Supple. No JVD. Carotids 2+ bilat; no bruits. No lymphadenopathy or thryomegaly appreciated. Cor: PMI nondisplaced. Regular rate & rhythm. No rubs, gallops or murmurs. Lungs: Clear Abdomen: Soft, nontender, nondistended. No hepatosplenomegaly. No bruits or masses. Good bowel sounds. Extremities: No cyanosis, clubbing, rash, edema Neuro: alert & oriented x 3, cranial nerves grossly intact. Moves all 4 extremities w/o difficulty. Affect pleasant.  ECG today 03/19/20: SR, 69 bpm (personally reviewed).  ASSESSMENT & PLAN: 1.Chronic systolic heart failure secondary to ICM - Echo9/21: EF 15%, global hypokinesis, mildly reduced right ventricular systolic function, mildly dilated left atrial, mod MR, mod TR, positive bubble study.  - ICD removed in April 2020 due to MSSA bacteremia. Deemed not to be a candidate for replacement due to continued IV drug use.  - NYHA II, euvolemic on exam today. - Continue carvedilol 3.125 mg bid. - Continue lasix 40 mg bid.  - Continue digoxin 0.125 mcg daily. - Continue Entresto 24-26 mg bid. Consider increasing at next visit. - Continue spiro 25 mg daily. - Continue Farxiga 10 mg daily. - Will see if patient can obtain medication through HF fund. Appreciate pharmacy assistance. - BMET, BNP, Digoxin level  2. CAD s/p CABG - Cath 11/09: LAD occluded at the ostium, patent RCA and sequential LIMA-D1&D2, ostial LCx looks improved, no change to OM1, patent OM2, with severe disease in distal LCx. RA 8, PA 55/20 (mean 33), PCWP 20, Fick CO 4/CI1.91, PVR 3.2.  - Troponin inpatient unremarkable, d-dimer  elevated but VQ scan negative for PE. - No CP. Continue statin.  3. Hx of COPD and current tobacco use - Cough inpatient has resolved. - Cut down to 1 ppd, encouraged further cessation. - Clear on exam today, no fever/chills.  4. Hx of IVDA - Reports last use was Labor Day. - Encouraged to stay abstinent. - Needs appointment with PCP for Suboxone.  5. Hx of CVA thought to be embolic in nature - No bleeding issues. - Continue Xarelto 20 mg daily.  - CBC today.  6. Elevated Creatinine - Cr baseline ~1.0, discharge Cr 1.13. - BMET today.  7. Hx of paroxysmal VT - SR on ECG today, no palpitations. - Continue amiodarone 200 mg daily. - Needs yearly eye exams. LFTs ok 12/21, follow thyroid.  8. Social - Does not have reliable transportation. - Will arrange for [facility] transport.  Follow up with Dr. Aundra Dubin in 3 months.  [place], [place], FNP-BC 04/27/20

## 2020-04-30 ENCOUNTER — Encounter (HOSPITAL_COMMUNITY): Payer: Medicare Other

## 2020-08-11 ENCOUNTER — Encounter: Payer: Self-pay | Admitting: *Deleted

## 2020-08-11 NOTE — Progress Notes (Signed)

## 2020-08-13 NOTE — Progress Notes (Signed)
Things That May Be Affecting Your Health:  Alcohol  Hearing loss  Pain    Depression  Home Safety  Sexual Health   Diabetes  Lack of physical activity  Stress   Difficulty with daily activities  Loneliness  Tiredness  x Drug use x Medicines x Tobacco use   Falls  Motor Vehicle Safety  Weight   Food choices  Oral Health  Other    YOUR PERSONALIZED HEALTH PLAN : 1. Schedule your next subsequent Medicare Wellness visit in one year 2. Attend all of your regular appointments to address your medical issues 3. Complete the preventative screenings and services   Annual Wellness Visit   Medicare Covered Preventative Screenings and [place] Men and Women Who How Often Need? Date of Last Service Action  Abdominal Aortic Aneurysm Adults with AAA risk factors Once      Alcohol Misuse and Counseling All Adults Screening once a year if no alcohol misuse. Counseling up to 4 face to face sessions.     Bone Density Measurement  Adults at risk for osteoporosis Once every 2 yrs      Lipid Panel Z13.6 All adults without CV disease Once every 5 yrs       Colorectal Cancer   Stool sample or  Colonoscopy All adults 85 and older   Once every year  Every 10 years x       Depression All Adults Once a year  Today   Diabetes Screening Blood glucose, post glucose load, or GTT Z13.1  All adults at risk  Pre-diabetics  Once per year  Twice per year      Diabetes  Self-Management Training All adults Diabetics 10 hrs first year; 2 hours subsequent years. Requires Copay     Glaucoma  Diabetics  Family history of glaucoma  African Americans 78 yrs +  Hispanic Americans 76 yrs + Annually - requires coppay      Hepatitis C Z72.89 or F19.20  High Risk for HCV  Born between 1945 and 1965  Annually  Once      HIV Z11.4 All adults based on risk  Annually btw ages 7 & 74 regardless of risk  Annually > 65 yrs if at increased risk      Lung Cancer Screening  Asymptomatic adults aged 18-77 with 30 pack yr history and current smoker OR quit within the last 15 yrs Annually Must have counseling and shared decision making documentation before first screen      Medical Nutrition Therapy Adults with   Diabetes  Renal disease  Kidney transplant within past 3 yrs 3 hours first year; 2 hours subsequent years     Obesity and Counseling All adults Screening once a year Counseling if BMI 30 or higher  Today   Tobacco Use Counseling Adults who use tobacco  Up to 8 visits in one year     Vaccines Z23  Hepatitis B  Influenza   Pneumonia  Adults   Once  Once every flu season  Two different vaccines separated by one year x    Next Annual Wellness Visit People with Medicare Every year  Today     Services & Screenings Women Who How Often Need  Date of Last Service Action  Mammogram  Z12.31 Women over 25 One baseline ages 97-39. Annually ager 40 yrs+      Pap tests All women Annually if high risk. Every 2 yrs for normal risk women  Screening for cervical cancer with   Pap (Z01.419 nl or Z01.411abnl) &  HPV Z11.51 Women aged 34 to 79 Once every 5 yrs     Screening pelvic and breast exams All women Annually if high risk. Every 2 yrs for normal risk women     Sexually Transmitted Diseases  Chlamydia  Gonorrhea  Syphilis All at risk adults Annually for non pregnant females at increased risk         [place] Men Who How Ofter Need  Date of Last Service Action  Prostate Cancer - DRE & PSA Men over 50 Annually.  DRE might require a copay.        Sexually Transmitted Diseases  Syphilis All at risk adults Annually for men at increased risk      Health Maintenance List Health Maintenance  Topic Date Due  . COVID-19 Vaccine (1) Never done  . TETANUS/TDAP  Never done  . COLONOSCOPY (Pts 45-63yrs Insurance coverage will need to be confirmed)  Never done  . INFLUENZA VACCINE  10/29/2020  . Hepatitis C Screening   Completed  . HIV Screening  Completed  . HPV VACCINES  Aged Out

## 2020-11-07 DIAGNOSIS — Z20822 Contact with and (suspected) exposure to covid-19: Secondary | ICD-10-CM | POA: Diagnosis not present

## 2021-07-19 DIAGNOSIS — Z20822 Contact with and (suspected) exposure to covid-19: Secondary | ICD-10-CM | POA: Diagnosis not present

## 2021-07-25 DIAGNOSIS — Z20822 Contact with and (suspected) exposure to covid-19: Secondary | ICD-10-CM | POA: Diagnosis not present

## 2022-08-29 IMAGING — CR DG CHEST 2V
2 series · 2 of 2 positions shown · non-contrast
Comparison: November 25, 2019.

CLINICAL DATA: Chest pain.

EXAM:
CHEST - 2 VIEW

[chest pa]
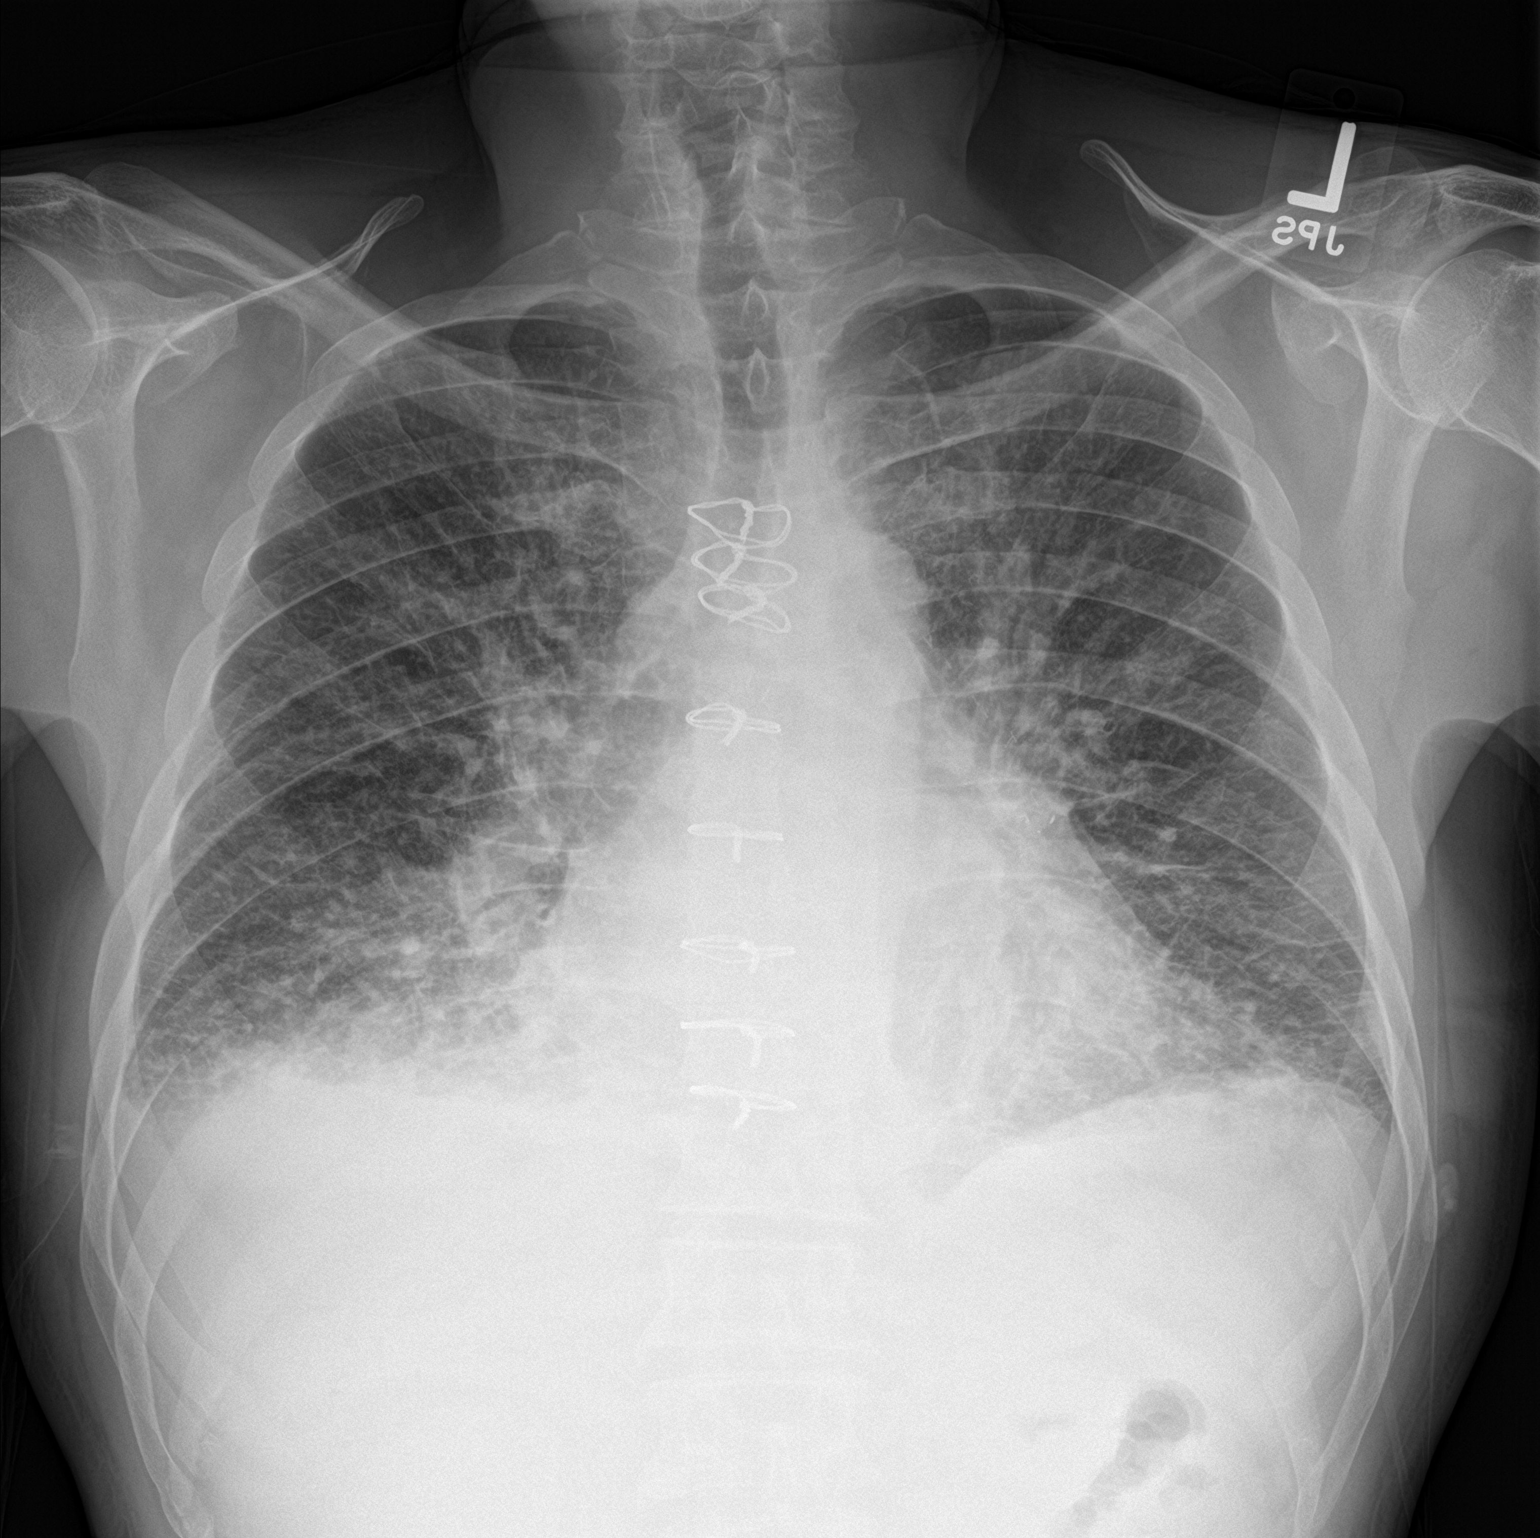

[chest lat]
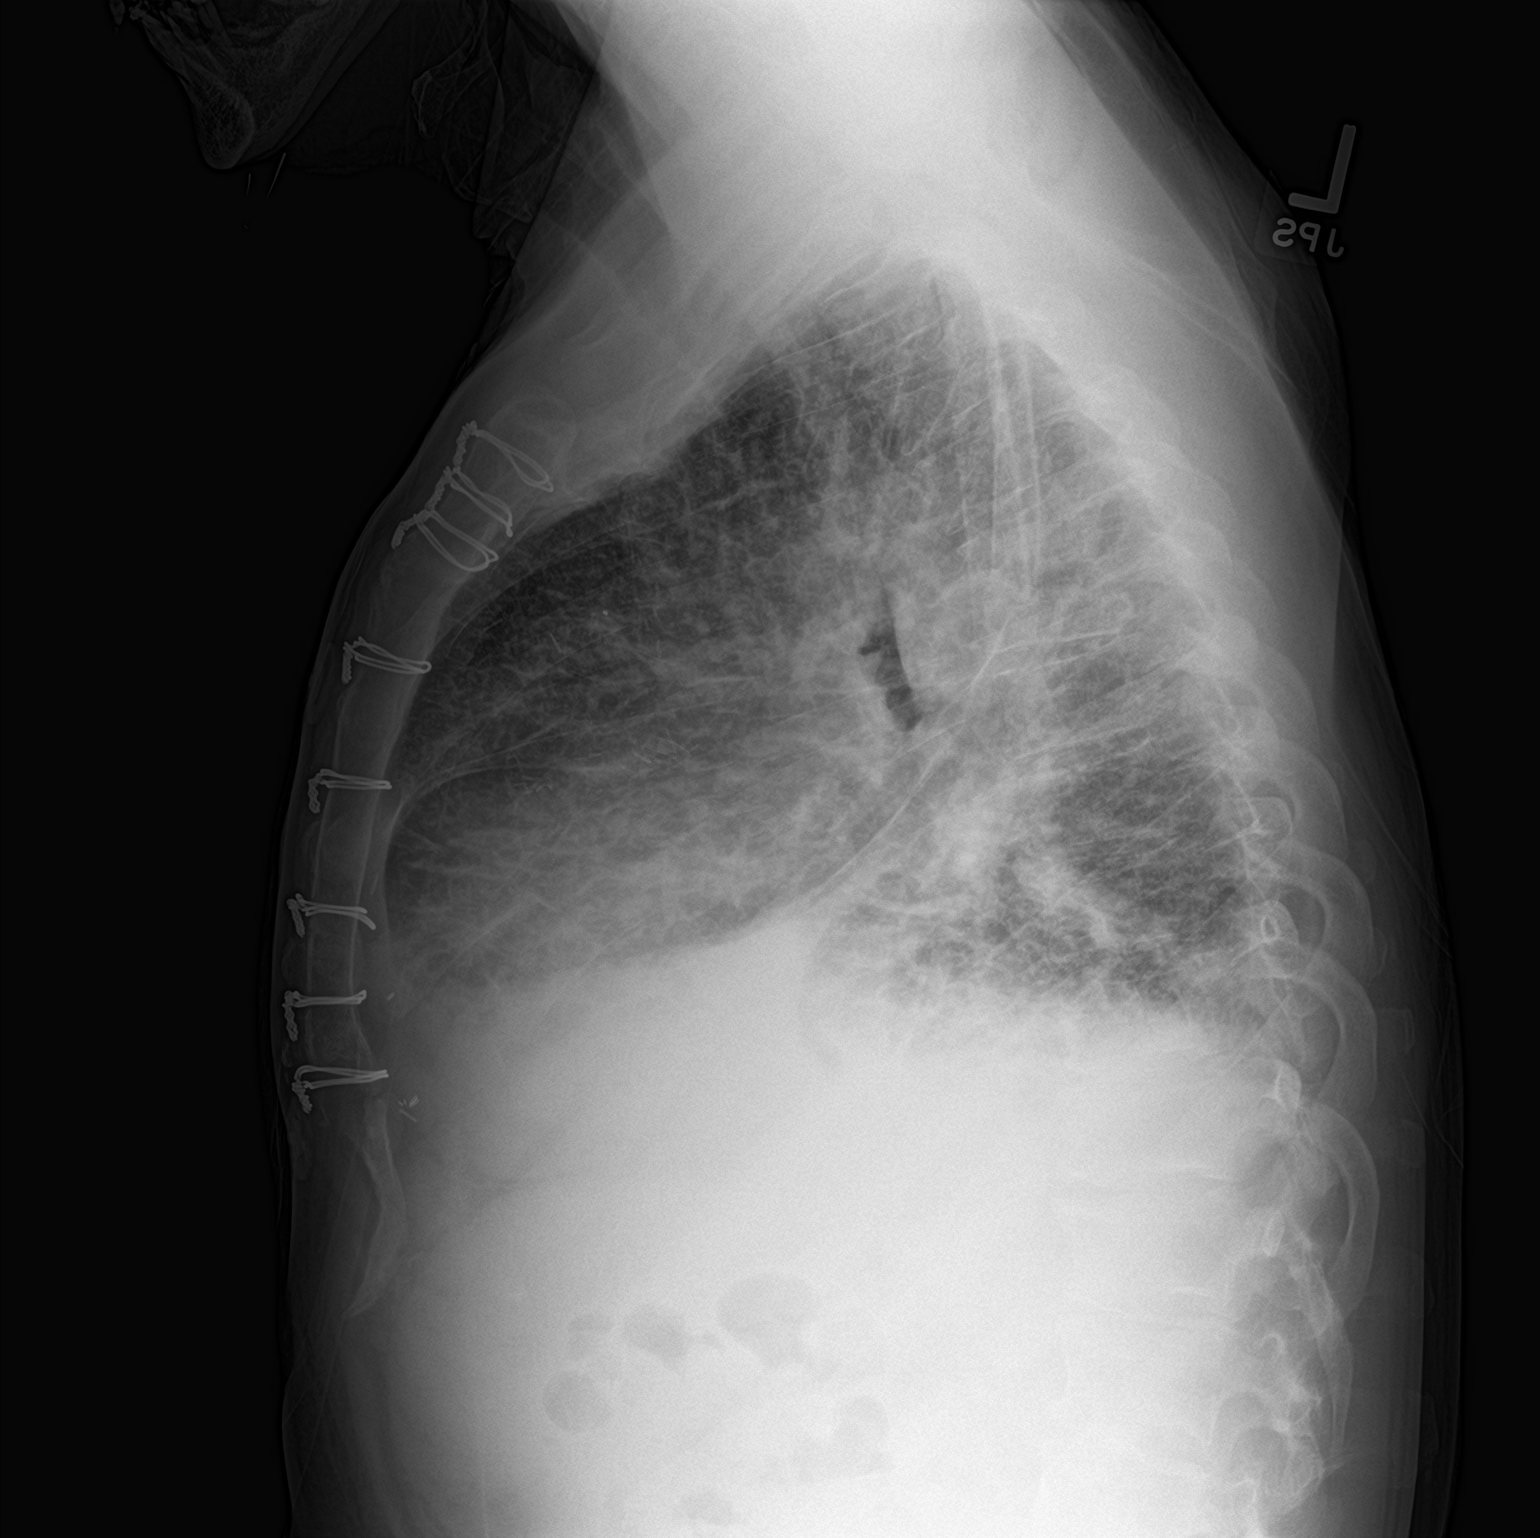

[2 of 2 positions shown; findings below may reference images not displayed]

FINDINGS: Stable cardiomediastinal silhouette. Sternotomy wires are noted. No
pneumothorax is noted. Interstitial densities are noted throughout
both lungs concerning for pulmonary edema. Small pleural effusions
may be present. Bony thorax is unremarkable.
IMPRESSION: Probable bilateral pulmonary edema.

## 2022-09-24 ENCOUNTER — Other Ambulatory Visit: Payer: Self-pay

## 2023-10-27 ENCOUNTER — Inpatient Hospital Stay (HOSPITAL_COMMUNITY)
Admission: EM | Admit: 2023-10-27 | Discharge: 2023-10-29 | DRG: 292 | Discharge status: Against Medical Advice | Attending: Internal Medicine | Admitting: Internal Medicine

## 2023-10-27 ENCOUNTER — Emergency Department (HOSPITAL_COMMUNITY)

## 2023-10-27 ENCOUNTER — Other Ambulatory Visit: Payer: Self-pay

## 2023-10-27 ENCOUNTER — Encounter (HOSPITAL_COMMUNITY): Payer: Self-pay

## 2023-10-27 DIAGNOSIS — Z825 Family history of asthma and other chronic lower respiratory diseases: Secondary | ICD-10-CM

## 2023-10-27 DIAGNOSIS — I5023 Acute on chronic systolic (congestive) heart failure: Secondary | ICD-10-CM | POA: Diagnosis not present

## 2023-10-27 DIAGNOSIS — I255 Ischemic cardiomyopathy: Secondary | ICD-10-CM | POA: Diagnosis present

## 2023-10-27 DIAGNOSIS — I509 Heart failure, unspecified: Principal | ICD-10-CM

## 2023-10-27 DIAGNOSIS — Z8673 Personal history of transient ischemic attack (TIA), and cerebral infarction without residual deficits: Secondary | ICD-10-CM | POA: Diagnosis not present

## 2023-10-27 DIAGNOSIS — I959 Hypotension, unspecified: Secondary | ICD-10-CM | POA: Diagnosis present

## 2023-10-27 DIAGNOSIS — I252 Old myocardial infarction: Secondary | ICD-10-CM

## 2023-10-27 DIAGNOSIS — Z720 Tobacco use: Secondary | ICD-10-CM | POA: Diagnosis present

## 2023-10-27 DIAGNOSIS — Z955 Presence of coronary angioplasty implant and graft: Secondary | ICD-10-CM

## 2023-10-27 DIAGNOSIS — J449 Chronic obstructive pulmonary disease, unspecified: Secondary | ICD-10-CM | POA: Diagnosis present

## 2023-10-27 DIAGNOSIS — R64 Cachexia: Secondary | ICD-10-CM | POA: Diagnosis present

## 2023-10-27 DIAGNOSIS — I4729 Other ventricular tachycardia: Secondary | ICD-10-CM

## 2023-10-27 DIAGNOSIS — I5043 Acute on chronic combined systolic (congestive) and diastolic (congestive) heart failure: Secondary | ICD-10-CM | POA: Diagnosis present

## 2023-10-27 DIAGNOSIS — Z95810 Presence of automatic (implantable) cardiac defibrillator: Secondary | ICD-10-CM

## 2023-10-27 DIAGNOSIS — R6881 Early satiety: Secondary | ICD-10-CM | POA: Diagnosis present

## 2023-10-27 DIAGNOSIS — F1190 Opioid use, unspecified, uncomplicated: Secondary | ICD-10-CM | POA: Diagnosis present

## 2023-10-27 DIAGNOSIS — I5021 Acute systolic (congestive) heart failure: Secondary | ICD-10-CM | POA: Diagnosis not present

## 2023-10-27 DIAGNOSIS — K219 Gastro-esophageal reflux disease without esophagitis: Secondary | ICD-10-CM | POA: Diagnosis present

## 2023-10-27 DIAGNOSIS — N1831 Chronic kidney disease, stage 3a: Secondary | ICD-10-CM | POA: Diagnosis not present

## 2023-10-27 DIAGNOSIS — E785 Hyperlipidemia, unspecified: Secondary | ICD-10-CM | POA: Diagnosis present

## 2023-10-27 DIAGNOSIS — F1990 Other psychoactive substance use, unspecified, uncomplicated: Secondary | ICD-10-CM | POA: Diagnosis present

## 2023-10-27 DIAGNOSIS — Z7901 Long term (current) use of anticoagulants: Secondary | ICD-10-CM | POA: Diagnosis not present

## 2023-10-27 DIAGNOSIS — F1910 Other psychoactive substance abuse, uncomplicated: Secondary | ICD-10-CM | POA: Diagnosis not present

## 2023-10-27 DIAGNOSIS — F1490 Cocaine use, unspecified, uncomplicated: Secondary | ICD-10-CM | POA: Diagnosis present

## 2023-10-27 DIAGNOSIS — R4182 Altered mental status, unspecified: Secondary | ICD-10-CM | POA: Diagnosis present

## 2023-10-27 DIAGNOSIS — S0101XA Laceration without foreign body of scalp, initial encounter: Secondary | ICD-10-CM | POA: Diagnosis present

## 2023-10-27 DIAGNOSIS — Z5329 Procedure and treatment not carried out because of patient's decision for other reasons: Secondary | ICD-10-CM | POA: Diagnosis not present

## 2023-10-27 DIAGNOSIS — I472 Ventricular tachycardia, unspecified: Secondary | ICD-10-CM | POA: Diagnosis present

## 2023-10-27 DIAGNOSIS — Z8619 Personal history of other infectious and parasitic diseases: Secondary | ICD-10-CM | POA: Diagnosis not present

## 2023-10-27 DIAGNOSIS — W07XXXA Fall from chair, initial encounter: Secondary | ICD-10-CM | POA: Diagnosis present

## 2023-10-27 DIAGNOSIS — Z951 Presence of aortocoronary bypass graft: Secondary | ICD-10-CM | POA: Diagnosis not present

## 2023-10-27 DIAGNOSIS — E7800 Pure hypercholesterolemia, unspecified: Secondary | ICD-10-CM | POA: Diagnosis present

## 2023-10-27 DIAGNOSIS — I63312 Cerebral infarction due to thrombosis of left middle cerebral artery: Secondary | ICD-10-CM | POA: Diagnosis not present

## 2023-10-27 DIAGNOSIS — Z85118 Personal history of other malignant neoplasm of bronchus and lung: Secondary | ICD-10-CM | POA: Diagnosis not present

## 2023-10-27 DIAGNOSIS — W2209XA Striking against other stationary object, initial encounter: Secondary | ICD-10-CM | POA: Diagnosis present

## 2023-10-27 DIAGNOSIS — Z560 Unemployment, unspecified: Secondary | ICD-10-CM | POA: Diagnosis not present

## 2023-10-27 DIAGNOSIS — Y92009 Unspecified place in unspecified non-institutional (private) residence as the place of occurrence of the external cause: Secondary | ICD-10-CM

## 2023-10-27 DIAGNOSIS — I2510 Atherosclerotic heart disease of native coronary artery without angina pectoris: Secondary | ICD-10-CM | POA: Diagnosis present

## 2023-10-27 DIAGNOSIS — Z8249 Family history of ischemic heart disease and other diseases of the circulatory system: Secondary | ICD-10-CM | POA: Diagnosis not present

## 2023-10-27 DIAGNOSIS — E871 Hypo-osmolality and hyponatremia: Secondary | ICD-10-CM | POA: Diagnosis present

## 2023-10-27 DIAGNOSIS — F17210 Nicotine dependence, cigarettes, uncomplicated: Secondary | ICD-10-CM | POA: Diagnosis present

## 2023-10-27 DIAGNOSIS — Z91148 Patient's other noncompliance with medication regimen for other reason: Secondary | ICD-10-CM

## 2023-10-27 DIAGNOSIS — R011 Cardiac murmur, unspecified: Secondary | ICD-10-CM | POA: Diagnosis present

## 2023-10-27 DIAGNOSIS — G4733 Obstructive sleep apnea (adult) (pediatric): Secondary | ICD-10-CM | POA: Diagnosis present

## 2023-10-27 DIAGNOSIS — I493 Ventricular premature depolarization: Secondary | ICD-10-CM | POA: Diagnosis present

## 2023-10-27 DIAGNOSIS — F411 Generalized anxiety disorder: Secondary | ICD-10-CM | POA: Diagnosis present

## 2023-10-27 DIAGNOSIS — Z79899 Other long term (current) drug therapy: Secondary | ICD-10-CM

## 2023-10-27 LAB — HEPATIC FUNCTION PANEL
ALT: 13 U/L (ref 0–44)
AST: 25 U/L (ref 15–41)
Albumin: 3 g/dL — ABNORMAL LOW (ref 3.5–5.0)
Alkaline Phosphatase: 96 U/L (ref 38–126)
Bilirubin, Direct: 0.5 mg/dL — ABNORMAL HIGH (ref 0.0–0.2)
Indirect Bilirubin: 0.8 mg/dL (ref 0.3–0.9)
Total Bilirubin: 1.3 mg/dL — ABNORMAL HIGH (ref 0.0–1.2)
Total Protein: 8.2 g/dL — ABNORMAL HIGH (ref 6.5–8.1)

## 2023-10-27 LAB — CBC
HCT: 38.2 % — ABNORMAL LOW (ref 39.0–52.0)
Hemoglobin: 11.7 g/dL — ABNORMAL LOW (ref 13.0–17.0)
MCH: 24.3 pg — ABNORMAL LOW (ref 26.0–34.0)
MCHC: 30.6 g/dL (ref 30.0–36.0)
MCV: 79.3 fL — ABNORMAL LOW (ref 80.0–100.0)
Platelets: 257 K/uL (ref 150–400)
RBC: 4.82 MIL/uL (ref 4.22–5.81)
RDW: 16.8 % — ABNORMAL HIGH (ref 11.5–15.5)
WBC: 10.3 K/uL (ref 4.0–10.5)
nRBC: 0 % (ref 0.0–0.2)

## 2023-10-27 LAB — BASIC METABOLIC PANEL WITH GFR
Anion gap: 12 (ref 5–15)
BUN: 41 mg/dL — ABNORMAL HIGH (ref 6–20)
CO2: 32 mmol/L (ref 22–32)
Calcium: 9.3 mg/dL (ref 8.9–10.3)
Chloride: 85 mmol/L — ABNORMAL LOW (ref 98–111)
Creatinine, Ser: 1.23 mg/dL (ref 0.61–1.24)
GFR, Estimated: >60 mL/min (ref >60)
Glucose, Bld: 121 mg/dL — ABNORMAL HIGH (ref 70–99)
Potassium: 3.5 mmol/L (ref 3.5–5.1)
Sodium: 129 mmol/L — ABNORMAL LOW (ref 135–145)

## 2023-10-27 LAB — RAPID URINE DRUG SCREEN, HOSP PERFORMED
Amphetamines: NOT DETECTED
Barbiturates: NOT DETECTED
Benzodiazepines: NOT DETECTED
Cocaine: POSITIVE — AB
Opiates: POSITIVE — AB
Tetrahydrocannabinol: NOT DETECTED

## 2023-10-27 LAB — URINALYSIS, ROUTINE W REFLEX MICROSCOPIC
Bilirubin Urine: NEGATIVE
Glucose, UA: NEGATIVE mg/dL
Hgb urine dipstick: NEGATIVE
Ketones, ur: NEGATIVE mg/dL
Leukocytes,Ua: NEGATIVE
Nitrite: NEGATIVE
Protein, ur: NEGATIVE mg/dL
Specific Gravity, Urine: 1.009 (ref 1.005–1.030)
pH: 6 (ref 5.0–8.0)

## 2023-10-27 LAB — MAGNESIUM: Magnesium: 1.8 mg/dL (ref 1.7–2.4)

## 2023-10-27 LAB — CBG MONITORING, ED: Glucose-Capillary: 75 mg/dL (ref 70–99)

## 2023-10-27 LAB — LIPASE, BLOOD: Lipase: 26 U/L (ref 11–51)

## 2023-10-27 LAB — TROPONIN I (HIGH SENSITIVITY)
Troponin I (High Sensitivity): 19 ng/L — ABNORMAL HIGH (ref <18)
Troponin I (High Sensitivity): 20 ng/L — ABNORMAL HIGH (ref <18)

## 2023-10-27 LAB — BRAIN NATRIURETIC PEPTIDE: B Natriuretic Peptide: >4500 pg/mL — ABNORMAL HIGH (ref 0.0–100.0)

## 2023-10-27 LAB — LACTIC ACID, PLASMA: Lactic Acid, Venous: 1.6 mmol/L (ref 0.5–1.9)

## 2023-10-27 LAB — HIV ANTIBODY (ROUTINE TESTING W REFLEX): HIV Screen 4th Generation wRfx: NONREACTIVE

## 2023-10-27 MED ORDER — SODIUM CHLORIDE 0.9% FLUSH: 3.0000 mL | INTRAVENOUS | Status: DC | PRN

## 2023-10-27 MED ORDER — ACETAMINOPHEN 325 MG PO TABS
650.0000 mg | ORAL_TABLET | ORAL | Status: DC | PRN
Start: 2023-10-27 — End: 2023-10-29

## 2023-10-27 MED ORDER — POTASSIUM CHLORIDE CRYS ER 20 MEQ PO TBCR
40.0000 meq | EXTENDED_RELEASE_TABLET | Freq: Once | ORAL | Status: AC
  Administered 2023-10-27: 40 meq via ORAL
  Filled 2023-10-27: qty 2

## 2023-10-27 MED ORDER — ONDANSETRON HCL 4 MG/2ML IJ SOLN: 4.0000 mg | Freq: Four times a day (QID) | INTRAMUSCULAR | Status: DC | PRN

## 2023-10-27 MED ORDER — CYCLOBENZAPRINE HCL 5 MG PO TABS
5.0000 mg | ORAL_TABLET | Freq: Once | ORAL | Status: AC
  Administered 2023-10-27: 5 mg via ORAL
  Filled 2023-10-27: qty 1

## 2023-10-27 MED ORDER — LOSARTAN POTASSIUM 25 MG PO TABS
25.0000 mg | ORAL_TABLET | Freq: Every day | ORAL | Status: DC
  Administered 2023-10-27: 25 mg via ORAL
  Filled 2023-10-27 (×2): qty 1

## 2023-10-27 MED ORDER — SODIUM CHLORIDE 0.9 % IV SOLN: 250.0000 mL | INTRAVENOUS | Status: AC | PRN

## 2023-10-27 MED ORDER — ENOXAPARIN SODIUM 40 MG/0.4ML IJ SOSY
40.0000 mg | PREFILLED_SYRINGE | INTRAMUSCULAR | Status: DC
  Administered 2023-10-27 – 2023-10-28 (×2): 40 mg via SUBCUTANEOUS
  Filled 2023-10-27 (×2): qty 0.4

## 2023-10-27 MED ORDER — FUROSEMIDE 10 MG/ML IJ SOLN
40.0000 mg | Freq: Two times a day (BID) | INTRAMUSCULAR | Status: AC
  Administered 2023-10-27: 40 mg via INTRAVENOUS
  Filled 2023-10-27: qty 4

## 2023-10-27 MED ORDER — SODIUM CHLORIDE 0.9% FLUSH
3.0000 mL | Freq: Two times a day (BID) | INTRAVENOUS | Status: DC
  Administered 2023-10-27 – 2023-10-29 (×4): 3 mL via INTRAVENOUS

## 2023-10-27 MED ORDER — FUROSEMIDE 10 MG/ML IJ SOLN
40.0000 mg | Freq: Once | INTRAMUSCULAR | Status: AC
  Administered 2023-10-27: 40 mg via INTRAVENOUS
  Filled 2023-10-27: qty 4

## 2023-10-27 NOTE — ED Provider Notes (Signed)
  EMERGENCY DEPARTMENT AT [facility] Provider Note   CSN: [id number] Arrival date & time: 10/27/23  9140     Patient presents with: Loss of Consciousness   John Davenport is a 48 y.o. male.   48 year old male presenting after a fall.  Patient reports that he was walking and fell, striking the top of his head against the fireplace, he is unable to tell me if he passed out/lost consciousness or if he just fell due to weakness.  He endorses 2 weeks of worsening shortness of breath on exertion stating I can only walk 20 steps before becoming short of breath as well as daily nausea/vomiting, he has been unable to keep anything down during that time.  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  He denies alcohol use.  Patient previously had a pacemaker but this was removed several years ago, has a history of CAD/CKD/CHF but admits that he does not take any medications except for fluid pills that his mom gives him on occasion.  He was previously on amiodarone /digoxin /Xarelto , it is unclear how long he has been off his medications but he states it has been years.   Loss of Consciousness      Prior to Admission medications   Medication Sig Start Date End Date Taking? Authorizing Provider  amiodarone  (PACERONE ) 200 MG tablet Take 1 tablet (200 mg total) by mouth daily. 03/19/20   Milford, Harlene HERO, FNP  buprenorphine -naloxone  (SUBOXONE ) 8-2 mg SUBL SL tablet Place 1 tablet under the tongue 2 (two) times daily. 03/30/20   Jerrell Cleatus Ned, MD  carvedilol  (COREG ) 3.125 MG tablet Take 1 tablet (3.125 mg total) by mouth 2 (two) times daily. 03/19/20 03/19/21  Glena Harlene HERO, FNP  dapagliflozin  propanediol (FARXIGA ) 10 MG TABS tablet Take 1 tablet (10 mg total) by mouth daily. 03/19/20   Milford, Harlene HERO, FNP  digoxin  (LANOXIN ) 0.125 MG tablet Take 1 tablet (0.125 mg total) by mouth daily.  03/19/20   Milford, Harlene HERO, FNP  furosemide  (LASIX ) 40 MG tablet Take 1 tablet (40 mg total) by mouth 2 (two) times daily. With extra 40 mg as needed for 3 pound weight gain. 03/19/20   Milford, Harlene HERO, FNP  rivaroxaban  (XARELTO ) 20 MG TABS tablet Take 1 tablet (20 mg total) by mouth daily with supper. 03/19/20   Milford, Harlene HERO, FNP  rosuvastatin  (CRESTOR ) 20 MG tablet TAKE 1 TABLET (20 MG TOTAL) BY MOUTH DAILY. 03/02/20 03/02/21  Clegg, Amy D, NP  sacubitril -valsartan  (ENTRESTO ) 24-26 MG Take 1 tablet by mouth 2 (two) times daily. 03/19/20   Glena Harlene HERO, FNP  spironolactone  (ALDACTONE ) 25 MG tablet Take 1 tablet (25 mg total) by mouth daily. 03/19/20   Glena Harlene HERO, FNP    Allergies: Patient has no known allergies.    Review of Systems  Cardiovascular:  Positive for syncope.    Updated Vital Signs  Vitals:   10/27/23 1215 10/27/23 1300 10/27/23 1330 10/27/23 1417  BP: 109/88 (!) 123/96 104/78   Pulse: (!) 103 96 94   Resp: 17 16 16    Temp:    (!) 97.4 F (36.3 C)  TempSrc:    Oral  SpO2: 95% 100% 99%   Weight:      Height:         Physical Exam Vitals and nursing note reviewed.  Constitutional:      Appearance: He is ill-appearing.  Comments: Cachetic   HENT:     Head: Normocephalic.      Comments: 2cm laceration to L parietal scalp, not actively bleeding    Mouth/Throat:     Comments: Poor dentition Eyes:     Extraocular Movements: Extraocular movements intact.     Comments: Pinpoint pupils  Cardiovascular:     Rate and Rhythm: Normal rate and regular rhythm.  Pulmonary:     Effort: Pulmonary effort is normal.     Breath sounds: Wheezing (trace diffuse) present.  Abdominal:     Palpations: Abdomen is soft.     Tenderness: There is no abdominal tenderness. There is no guarding.  Musculoskeletal:     Cervical back: Normal range of motion and neck supple. No rigidity or tenderness.     Right lower leg: Edema (2+ pitting) present.     Left  lower leg: Edema (2+ pitting) present.     Comments: Moves all extremities spontaneously without difficulty  Skin:    General: Skin is warm and dry.     Comments: Multiple scabs to bilateral lower extremities  Neurological:     Mental Status: He is alert and oriented to person, place, and time.     (all labs ordered are listed, but only abnormal results are displayed) Labs Reviewed  BASIC METABOLIC PANEL WITH GFR - Abnormal; Notable for the following components:      Result Value   Sodium 129 (*)    Chloride 85 (*)    Glucose, Bld 121 (*)    BUN 41 (*)    All other components within normal limits  CBC - Abnormal; Notable for the following components:   Hemoglobin 11.7 (*)    HCT 38.2 (*)    MCV 79.3 (*)    MCH 24.3 (*)    RDW 16.8 (*)    All other components within normal limits  HEPATIC FUNCTION PANEL - Abnormal; Notable for the following components:   Total Protein 8.2 (*)    Albumin  3.0 (*)    Total Bilirubin 1.3 (*)    Bilirubin, Direct 0.5 (*)    All other components within normal limits  BRAIN NATRIURETIC PEPTIDE - Abnormal; Notable for the following components:   B Natriuretic Peptide >4,500.0 (*)    All other components within normal limits  RAPID URINE DRUG SCREEN, HOSP PERFORMED - Abnormal; Notable for the following components:   Opiates POSITIVE (*)    Cocaine  POSITIVE (*)    All other components within normal limits  TROPONIN I (HIGH SENSITIVITY) - Abnormal; Notable for the following components:   Troponin I (High Sensitivity) 19 (*)    All other components within normal limits  TROPONIN I (HIGH SENSITIVITY) - Abnormal; Notable for the following components:   Troponin I (High Sensitivity) 20 (*)    All other components within normal limits  CULTURE, BLOOD (ROUTINE X 2)  CULTURE, BLOOD (ROUTINE X 2)  LIPASE, BLOOD  URINALYSIS, ROUTINE W REFLEX MICROSCOPIC  CBG MONITORING, ED    EKG: EKG Interpretation Date/Time:  Tuesday October 27 2023 09:11:58  EDT Ventricular Rate:  94 PR Interval:  190 QRS Duration:  186 QT Interval:  582 QTC Calculation: 727 R Axis:   141  Text Interpretation: Normal sinus rhythm Right atrial enlargement unchaged from prior When compared with ECG of 03-Apr-2020 15:14,PREVIOUS ECG IS PRESENT Confirmed by Ruthe Cornet [phone]) on 10/27/2023 9:23:21 AM  Radiology: [facility] Chest 2 View Result Date: 10/27/2023 CLINICAL DATA:  Chest pain. EXAM: CHEST -  2 VIEW COMPARISON:  Chest radiograph dated 03/01/2020. FINDINGS: There is cardiomegaly with vascular congestion and mild edema. No focal consolidation, pleural effusion, or pneumothorax. Median sternotomy wires and CABG vascular clips. No acute osseous pathology. IMPRESSION: Cardiomegaly with findings of CHF. Electronically Signed   By: Vanetta Chou M.D.   On: 10/27/2023 10:22   CT Head Wo Contrast Result Date: 10/27/2023 CLINICAL DATA:  Provided history: Polytrauma, blunt. EXAM: CT HEAD WITHOUT CONTRAST TECHNIQUE: Contiguous axial images were obtained from the base of the skull through the vertex without intravenous contrast. RADIATION DOSE REDUCTION: This exam was performed according to the departmental dose-optimization program which includes automated exposure control, adjustment of the mA and/or kV according to patient size and/or use of iterative reconstruction technique. COMPARISON:  Brain MRI 03/28/2020.  Head CT 03/28/2020. FINDINGS: Brain: No age-advanced or lobar predominant cerebral atrophy. 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 There is no acute intracranial hemorrhage. No demarcated cortical infarct. No extra-axial fluid collection. No evidence of an intracranial mass. No midline shift. Vascular: No hyperdense vessel. Atherosclerotic calcifications. Skull: No calvarial fracture or aggressive osseous lesion. Sinuses/Orbits: No mass or acute finding within the imaged orbits. No significant paranasal sinus disease at the imaged levels.  IMPRESSION: 1.  No evidence of an acute intracranial abnormality. 2. 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 Electronically Signed   By: Rockey Childs D.O.   On: 10/27/2023 10:08     Procedures   Medications Ordered in the ED  furosemide  (LASIX ) injection 40 mg (40 mg Intravenous Given 10/27/23 1211)                                    Medical Decision Making This patient presents to the ED for concern of fall versus syncope with worsening shortness of breath on exertion, this involves an extensive number of treatment options, and is a complaint that carries with it a high risk of complications and morbidity.  The differential diagnosis includes CHF exacerbation, ACS, pneumonia, dysrhythmia/arrhythmia   Co morbidities that complicate the patient evaluation  CHF, CKD, CAD, OUD   Additional history obtained:  Additional history obtained from record review External records from outside source obtained and reviewed including prior ED notes   Lab Tests:  I Ordered, and personally interpreted labs.  The pertinent results include: CBC notable for hemoglobin of 11.7 with reduced HCT/MCV/MCH, no leukocytosis.  CBG 75.  BMP notable for mild hyponatremia with sodium of 129, creatinine 1.23 which appears stable from patient's baseline 3 years ago.  Initial troponin of 19, repeat 20.  BNP significantly elevated >4500.  Hepatic function panel largely unremarkable, minimally elevated total bilirubin at 1.3.  Urinalysis within normal limits, urine drug screen positive for opiates and cocaine .   Imaging Studies ordered:  I ordered imaging studies including CT head, CXR  I independently visualized and interpreted imaging which showed  CT head: 1.  No evidence of an acute intracranial abnormality. 2. 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 CXR: Cardiomegaly with findings of CHF.     I agree with the radiologist interpretation   Cardiac Monitoring:  / EKG:  The patient was maintained on a cardiac monitor.  I personally viewed and interpreted the cardiac monitored which showed an underlying rhythm of: NSR   Consultations Obtained:  I requested consultation with the hospitalist,  and discussed lab and imaging findings as well as pertinent plan -  they recommend: I spoke with the hospitalist service, Dr. Kathrin, who agrees that this patient is appropriate for admission, they plan to discuss this patient's case with cardiology as well given his history.   Problem List / ED Course / Critical interventions / Medication management  I ordered medication including IV Lasix  for diuresis Reevaluation of the patient after these medicines showed that the patient improved I have reviewed the patients home medicines and have made adjustments as needed   Social Determinants of Health:  Tobacco use, substance abuse   Test / Admission - Considered:  Physical exam is notable as above.  Patient's extremely elevated BNP with associated worsening lower extremity edema and worsening dyspnea on exertion are suggestive of heart failure exacerbation.  Patient has extensive cardiac history, previously had a pacemaker which was removed several years ago, and at one time was on amiodarone /digoxin /Xarelto , he admits that he does not take any of these medications and it is unclear when he was last followed up with by a primary care provider or cardiologist, he reports taking his mother's fluid pills sometimes.  Patient uses IV heroin, denies other substance/alcohol use but did test positive for cocaine  on urine drug screen.  He is at high risk for clinical worsening given his extensive history/IV substance use/symptoms, I do feel that he would benefit from admission for diuresis and telemetry with possible echocardiogram to further evaluate the status of his heart failure.  See above for hospitalist recommendations, he is appropriate for admission at this time.     Amount and/or Complexity of Data Reviewed Labs: ordered. Radiology: ordered.  Risk Prescription drug management. Decision regarding hospitalization.        Final diagnoses:  Acute on chronic congestive heart failure, unspecified heart failure type [facility])    ED Discharge Orders     None          John Davenport 10/27/23 1429    Ruthe Cornet, DO 10/27/23 1435

## 2023-10-27 NOTE — ED Notes (Signed)
 Pt transported to xray

## 2023-10-27 NOTE — ED Triage Notes (Addendum)
 Pt states he had a syncopal event this morning and hit head on fireplace. C/O CP for 2 weeks. Pt has laceration to top of head. Denies blood thinners. C/O SHOB/ nausea. Pt took 81mg  of aspirin .

## 2023-10-27 NOTE — ED Notes (Signed)
 Report given to RN deena on 3E.

## 2023-10-27 NOTE — Progress Notes (Addendum)
 Cardiology Consultation   Patient ID: John Davenport MRN: [id number]; DOB: [birth year]  Admit date: 10/27/2023 Date of Consult: 10/27/2023  PCP:  Freddrick, No   [facility] Providers Cardiologist:  None  Advanced Heart Failure:  John Shuck, MD       Patient Profile: John Davenport is a 48 y.o. male with a hx of CAD s/p CABG (MI with occlusion LAD 12/2007 tx w BMS, reocclusion of LAD stent 6/10 and LIMA-D1 & D2 1/11 with NSTEMI 6/17 99% stenosis small distal LCX), chronic systolic CHF secondary to ICM, hx of hospitalization for MSSA bacteremia requiring ICD removal 06/2018, paroxysmal ventricular tachycardia on amiodarone , COPD, CVA s/p tPA and mechanical thrombectomy (2/18) thought related to cardio-embolic nature started Xarelto , CKD, and hepatitis C  who is being seen 10/27/2023 for the evaluation of medication noncompliance and HF exacerbation at the request of John Bump MD.  History of Present Illness: John Davenport has a complex and extensive cardiac history. He has not been seen by heart care since 2021 at which time he was being seen by the advanced heart failure team.  Due to his continued IV drug use he was not deemed a candidate for advanced therapies.  Historically he continues to have an ICD, though it had to be removed due to MSSA bacteremia in 2020.  He has had subsequent hospitalizations for bacteremia since this time.  He was to continue on amiodarone  for his paroxysmal VT.   Last LHC was in 2019 which showed patent RCA and LAD occlusion at the ostium with patent   LIMA-D1/D2 that backfills the LAD.  60 to 70% ostial Lcx stenosis.  Small to moderate high OM1 disease with 60% ostial stenosis.  Severe disease in the distal Lcx.   Last echocardiogram on file was in 2021 which showed LVEF less than 20% with global hypokinesis.  Grade 2 diastolic dysfunction.  Mildly elevated pulmonary artery systolic pressures.  Moderately dilated LA.  Mild MR.  Myxomatous TV with  moderate TR.  Presented to the ED 7/29 for loss of consciousness after he fell and hit his head.  Last used IV heroin this morning prior to fall. Also noted 2 weeks of progressive shortness of breath on exertion as well as daily nausea and vomiting.    Admitted medication noncompliance except for with his Lasix .   In the ED, patient has been tachycardic with a soft to normotensive BP and lab work notable for sodium of 129, K 3.5, albumin  3, total bilirubin 1.3, hgb 11.7, BNP >4500 Troponin 19 -> 20, Mag pending ECG sinus rhythm with RAD, right atrial enlargement and poor R wave progression and LBBB VR 94 [similar to prior]  CT head unremarkable CXR shows vascular congestion and mild pulmonary edema Drug screen positive for opiates and cocaine .  Has received 1 dose of IV lasix  40mg , K scheduled to be repleted Smokes a pack of cigarettes a day.  Denies alcohol use.  Currently uses IV heroin.   On interview patient shared he has been noticing lightheadedness, dizziness, DOE, orthopnea, cough, early satiety, N/V, and peripheral edema for the last 2 weeks.  Denied palpitations, chest pain, and PND.  No recent illness.  He is not short of breath at rest. Patient has been noncompliant with his medications since last discharge of 2021.  He has been using his Lasix  as needed.  He describes he uses his Lasix  when his peripheral edema worsens.  He noted over the last 2 weeks he has been  periodically using his Lasix .  Last dose was yesterday.  He also took an aspirin  yesterday mother thought it would be a good idea as she was worried.  Patient used IV heroin this morning.  He fell out of his chair hit his head.  Denied prodromal symptoms. Asked why patient stopped using his medications he stated that he thought he was supposed to stop at his last appointment.  Per Chart review patient was supposed to follow-up in 3 weeks after his [name] appointment.  At that appointment he was supposed to be continued on  Coreg  3.125 mg twice daily, Lasix  40 mg twice daily, amiodarone  200 mg daily, digoxin  0.125 mcg daily, Entresto  24/26 mg twice daily, Spiriva 25 mg daily, Farxiga  10 mg daily, and Xarelto  20 mg daily.  Past Medical History:  Diagnosis Date   Anxiety state, unspecified    Back pain    Cancer (HCC)    Lung CA   Chronic systolic heart failure (HCC)    a. Ischemic CM (EF 30% by echo in 2011) b. echo 08/2015: EF 20% with severe HK and AK of the inferoseptal and apical myocardium.   Coronary artery disease    a. 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  b. eventually required single vessel CABG (L-LAD) in 03/2009 due to restenosis. c. cath 2013 showed patent LIMA sequential to the diags with LAD filling retrograde, no new disease in RCA and Cx, low LVEDP, sx felt noncardiac. d. 08/2015: NSTEMI: CTO of prox LAD, 99% stenosis of small dCx (no stent), + for cocaine    Esophageal reflux    Heart attack (HCC)    History of hepatitis C virus infection 07/15/2018   07/20/18 Viral RNA negative suggest past, cleared infection   Hypercholesteremia    ICD (implantable cardioverter-defibrillator) infection (HCC)    a. [place] [employer] ICD 12/2008.    Ischemic cardiomyopathy    echocardiogram 12/11: Mild LVH, EF 30-35%, anteroseptal and apical akinesis, grade 1 diastolic dysfunction   LBP (low back pain)    OSA (obstructive sleep apnea) 12/30/2010   Paroxysmal ventricular tachycardia (HCC)    Stroke (HCC)    Tobacco abuse     Past Surgical History:  Procedure Laterality Date   BUBBLE STUDY  11/30/2019   Procedure: BUBBLE STUDY;  Surgeon: Loni Soyla LABOR, MD;  Location[facility] ENDOSCOPY;  Service: Cardiology;;   CARDIAC CATHETERIZATION N/A 09/06/2015   Procedure: Left Heart Cath and Cors/Grafts Angiography;  Surgeon: Ozell Fell, MD;  Location: [facility] INVASIVE CV LAB;  Service: Cardiovascular;  Laterality: N/A;   CARDIAC DEFIBRILLATOR PLACEMENT     CARDIAC DEFIBRILLATOR  PLACEMENT     2010 by [facility]   CORONARY ANGIOPLASTY WITH STENT PLACEMENT     LAD stenting 10/09 and re-opening 6/10   CORONARY ARTERY BYPASS GRAFT     1/11   ICD LEAD REMOVAL Left 07/16/2018   Procedure: ICD LEAD REMOVAL;  Surgeon: Waddell Danelle ORN, MD;  Location: [facility] OR;  Service: Cardiovascular;  Laterality: Left;  Owen back up   IR GENERIC HISTORICAL  05/21/2016   IR PERCUTANEOUS ART THROMBECTOMY/INFUSION INTRACRANIAL INC DIAG ANGIO 05/21/2016 Thyra Nash, MD [facility]-INTERV RAD   IR GENERIC HISTORICAL  06/16/2016   IR RADIOLOGIST EVAL & MGMT 06/16/2016 [facility]INTER[facility] RAD   LEFT HEART CATHETERIZATION WITH CORONARY ANGIOGRAM N/A 08/07/2011   Procedure: LEFT HEART CATHETERIZATION WITH CORONARY ANGIOGRAM;  Surgeon: Maude JAYSON Emmer, MD;  Location: [facility] CATH LAB;  Service: Cardiovascular;  Laterality: N/A;   RADIOLOGY WITH ANESTHESIA N/A 05/21/2016   Procedure: RADIOLOGY WITH ANESTHESIA;  Surgeon: Thyra Nash, MD;  Location: [facility] OR;  Service: Radiology;  Laterality: N/A;   RIGHT/LEFT HEART CATH AND CORONARY ANGIOGRAPHY N/A 02/03/2018   Procedure: RIGHT/LEFT HEART CATH AND CORONARY ANGIOGRAPHY;  Surgeon: Rolan John RAMAN, MD;  Location: [facility] INVASIVE CV LAB;  Service: Cardiovascular;  Laterality: N/A;   TEE WITHOUT CARDIOVERSION N/A 07/16/2018   Procedure: TRANSESOPHAGEAL ECHOCARDIOGRAM (TEE);  Surgeon: Waddell Danelle ORN, MD;  Location: [facility] OR;  Service: Cardiovascular;  Laterality: N/A;   TEE WITHOUT CARDIOVERSION N/A 11/30/2019   Procedure: TRANSESOPHAGEAL ECHOCARDIOGRAM (TEE);  Surgeon: Loni Soyla LABOR, MD;  Location: [facility] ENDOSCOPY;  Service: Cardiology;  Laterality: N/A;       Scheduled Meds:  enoxaparin  (LOVENOX ) injection  40 mg Subcutaneous Q24H   furosemide   40 mg Intravenous Q12H   losartan   25 mg Oral Daily   sodium chloride  flush  3 mL Intravenous Q12H   Continuous Infusions:  sodium chloride      PRN Meds: sodium chloride , acetaminophen , ondansetron  (ZOFRAN ) IV, sodium chloride   flush  Allergies:   No Known Allergies  Social History:   Social History   Socioeconomic History   Marital status: Single    Spouse name: Not on file   Number of children: 1   Years of education: Not on file   Highest education level: Not on file  Occupational History   Occupation: disabled    Employer: UNEMPLOYED  Tobacco Use   Smoking status: Every Day    Current packs/day: 0.50    Average packs/day: 0.5 packs/day for 20.0 years (10.0 ttl pk-yrs)    Types: Cigarettes   Smokeless tobacco: Former    Types: Chew   Tobacco comments:    smoke 0.5 per week  Vaping Use   Vaping status: Never Used  Substance and Sexual Activity   Alcohol use: No   Drug use: Yes    Types: Cocaine    Sexual activity: Not on file  Other Topics Concern   Not on file  Social History Narrative   Pt lives with his mom, brother, and 63 yo son in [place].   Quit tobacco in Jan. 2011 prior to heart surgery (prior -1/2 PPD (down from 1 ppd per day, onset age 23)    alcohol abuse- prior, hasn't drank since age 59   no illicit drug use    Currently unemployed, awaiting disability   Single         Social Drivers of Health   Financial Resource Strain: Low Risk  (01/27/2018)   Overall Financial Resource Strain (CARDIA)    Difficulty of Paying Living Expenses: Not hard at all  Food Insecurity: No Food Insecurity (01/27/2018)   Hunger Vital Sign    Worried About Running Out of Food in the Last Year: Never true    Ran Out of Food in the Last Year: Never true  Transportation Needs: No Transportation Needs (01/27/2018)   PRAPARE - Administrator, Civil Service (Medical): No    Lack of Transportation (Non-Medical): No  Physical Activity: Not on file  Stress: Not on file  Social Connections: Unknown (07/01/2022)   Received from [facility]   Social Network    Social Network: Not on file  Intimate Partner Violence: Unknown (07/01/2022)   Received from [facility]   HITS     Physically Hurt: Not on file    Insult or Talk Down To: Not on  file    Threaten Physical Harm: Not on file    Scream or Curse: Not on file    Family History:   Family History  Problem Relation Age of Onset   Heart attack Father 38       died at 41 after multiple MI's   COPD Maternal Grandmother      ROS:  Please see the history of present illness.  All other ROS reviewed and negative.     Physical Exam/Data: Vitals:   10/27/23 1330 10/27/23 1345 10/27/23 1415 10/27/23 1417  BP: 104/78 (!) 112/90 (!) 119/93   Pulse: 94 97    Resp: 16 13 15    Temp:    (!) 97.4 F (36.3 C)  TempSrc:    Oral  SpO2: 99%     Weight:      Height:       No intake or output data in the 24 hours ending 10/27/23 1604    10/27/2023    9:09 AM 04/10/2020   10:47 AM 04/03/2020    2:50 PM  Last 3 Weights  Weight (lbs) 160 lb 179 lb 12.8 oz 175 lb 12.8 oz  Weight (kg) 72.576 kg 81.557 kg 79.742 kg     Body mass index is 20 kg/m.  General: Cachectic, accessory muscle use sitting upright in no acute distress.  HEENT: normal Neck: + JVD Vascular: Distal pulses 1+ bilaterally Cardiac: Regular rhythm, tachycardic rate, 3 out of 6 systolic murmur heard best in the tricuspid area Lungs: Crackles and wheezing bilaterally Abd: soft, nontender, no hepatomegaly  Ext: 2+ pitting edema bilaterally with overlying black ulcers and areas of inflammatory changes.  No purulent drainage from ulcers.  Musculoskeletal:  No deformities, BUE and BLE strength normal and equal Skin: warm and dry  Neuro:  CNs 2-12 intact, no focal abnormalities noted Psych: Patient fell asleep during interview but was easily arousable.  Normal affect   EKG:  The EKG was personally reviewed and demonstrates: See H &P Telemetry:  Telemetry was personally reviewed and demonstrates:  Sinus tachycardia with frequent PVCs  Relevant CV Studies: Echocardiogram 03/29/20 IMPRESSIONS     1. Left ventricular ejection fraction, by estimation, is  <20%. The left  ventricle has severely decreased function. The left ventricle demonstrates  global hypokinesis. The left ventricular internal cavity size was severely  dilated. Left ventricular  diastolic parameters are consistent with Grade II diastolic dysfunction  (pseudonormalization).   2. Right ventricular systolic function is normal. The right ventricular  size is normal. There is mildly elevated pulmonary artery systolic  pressure. The estimated right ventricular systolic pressure is 38.0 mmHg.   3. Left atrial size was moderately dilated.   4. The mitral valve is normal in structure. Mild mitral valve  regurgitation. No evidence of mitral stenosis.   5. The tricuspid valve is myxomatous. Tricuspid valve regurgitation is  moderate.   6. The aortic valve is normal in structure. Aortic valve regurgitation is  not visualized. No aortic stenosis is present.   7. The inferior vena cava is normal in size with greater than 50%  respiratory variability, suggesting right atrial pressure of 3 mmHg.   Comparison(s): No current evidence of mural thrombus.   RHC/LHC 09/06/15 LIMA . Sequential LIMA-diagonal 1 and diagonal 2 is widely patent. This graft retrograde fills the LAD Ost LAD to Prox LAD lesion, 100% stenosed. The lesion was previously treated with a bare metal stent. LM lesion, 50% stenosed. Mid Cx lesion, 99% stenosed.  1. Severe single vessel CAD with total occlusion of the proximal LAD within the previously implanted stent 2. Mild-moderate distal left main disease extending into the proximal circumflex 3. Widely patent dominant RCA 4. Widely patent sequential LIMA-diagonal 1 and 2 also filling the LAD 5. Subtotal occlusion of the distal circumflex into a tiny OM, possible culprit for his NSTEMI 6. Known severe LV dysfunction  Laboratory Data: High Sensitivity Troponin:   Recent Labs  Lab 10/27/23 0916 10/27/23 1213  TROPONINIHS 19* 20*     Chemistry Recent Labs  Lab  10/27/23 0916  NA 129*  K 3.5  CL 85*  CO2 32  GLUCOSE 121*  BUN 41*  CREATININE 1.23  CALCIUM  9.3  GFRNONAA >60  ANIONGAP 12    Recent Labs  Lab 10/27/23 0916  PROT 8.2*  ALBUMIN  3.0*  AST 25  ALT 13  ALKPHOS 96  BILITOT 1.3*   Lipids No results for input(s): CHOL, TRIG, HDL, LABVLDL, LDLCALC, CHOLHDL in the last 168 hours.  Hematology Recent Labs  Lab 10/27/23 0916  WBC 10.3  RBC 4.82  HGB 11.7*  HCT 38.2*  MCV 79.3*  MCH 24.3*  MCHC 30.6  RDW 16.8*  PLT 257   Thyroid  No results for input(s): TSH, FREET4 in the last 168 hours.  BNP Recent Labs  Lab 10/27/23 0916  BNP >4,500.0*    DDimer No results for input(s): DDIMER in the last 168 hours.  Radiology/Studies:  DG Chest 2 View Result Date: 10/27/2023 CLINICAL DATA:  Chest pain. EXAM: CHEST - 2 VIEW COMPARISON:  Chest radiograph dated 03/01/2020. FINDINGS: There is cardiomegaly with vascular congestion and mild edema. No focal consolidation, pleural effusion, or pneumothorax. Median sternotomy wires and CABG vascular clips. No acute osseous pathology. IMPRESSION: Cardiomegaly with findings of CHF. Electronically Signed   By: Vanetta Chou M.D.   On: 10/27/2023 10:22   CT Head Wo Contrast Result Date: 10/27/2023 CLINICAL DATA:  Provided history: Polytrauma, blunt. EXAM: CT HEAD WITHOUT CONTRAST TECHNIQUE: Contiguous axial images were obtained from the base of the skull through the vertex without intravenous contrast. RADIATION DOSE REDUCTION: This exam was performed according to the departmental dose-optimization program which includes automated exposure control, adjustment of the mA and/or kV according to patient size and/or use of iterative reconstruction technique. COMPARISON:  Brain MRI 03/28/2020.  Head CT 03/28/2020. FINDINGS: Brain: No age-advanced or lobar predominant cerebral atrophy. 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 There is no acute intracranial  hemorrhage. No demarcated cortical infarct. No extra-axial fluid collection. No evidence of an intracranial mass. No midline shift. Vascular: No hyperdense vessel. Atherosclerotic calcifications. Skull: No calvarial fracture or aggressive osseous lesion. Sinuses/Orbits: No mass or acute finding within the imaged orbits. No significant paranasal sinus disease at the imaged levels. IMPRESSION: 1.  No evidence of an acute intracranial abnormality. 2. 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 Electronically Signed   By: Rockey Childs D.O.   On: 10/27/2023 10:08     Assessment and Plan: Acute on Chronic systolic and diastolic heart failure  Hyponatremia Polysubstance use- UDS + cocaine  and opiates Patient has known ICM, and seen by heart failure team previously.  He historically had an ICD in place though was removed in 2020 2/2 MSSA bacteremia. Not a candidate for advanced therapies 2/2 ongoing IVDU This admission patient reported progressive worsening shortness of breath, early satiety, and peripheral edema for last 2 weeks.  Patient short of breath at rest.  BNP >4500 CXR with  evidence of volume overload  Echocardiogram pending  Has received IV lasix  40mg , no charted input/output.  Patient reported he has filled 3 urinals to the top.  Given concern for low output: Lactic acid pending, LFTs unremarkable.   Suspect his exacerbation is from medication noncompliance. Would continue current IV diuresis as he is having good urine output with improvement in symptoms, though still short of breath at rest on exam.  Will defer beta-blocker use at this time given concern for low output as well as concurrent cocaine  use.  Will hold on milrinone 2/2 ventricular arrhthymias. Will hold off on MRA/SGLT2 inhibitor at this time with his low/normotensive BP and starting cozaar .  Strict I/Os Continue IV lasix  40 mg BID Continue Cozaar  25 mg  Hyponatremia (Na 129) suspected to be related to  volume overload. Monitor closely with IV diuresis.   CAD s/p CABG  Paroxysmal VT Patient noncompliant with amiodarone  Per chart review RN noted 6 beat episode of VT Per telemetry patient does have frequent PVCs and very brief nonsustained VT episodes.  Historically had an ICD in place though was removed due to MSSA bacteremia in 2020.  Given ongoing IV drug use he is not a candidate for ICD placement.   K 3.5, being repleted 2/2 IV diuresis. Mag pending Keep K >4 and Mag >2 Hold off on restarting amiodarone .  Hx of CVA of cardioembolic nature  Patient noncompliant with xarelto   Will hold on restarting xarelto  at this time  Fall Do not suspect fall is cardiac in etiology.  Patient shared no prodrome symptoms, however had used IV heroin prior to fall from chair.  Fall most likely related to drug use.   CKD  Appears at baseline Cr. 1.23  Per primary COPD Head injury from fall Polysubstance withdraw  Risk Assessment/Risk Scores:       [place]  Heart Association (NYHA) Functional Class NYHA Class IV       For questions or updates, please contact [facility] Please consult [email or web address] for contact info under    Signed, Leontine LOISE Salen, PA-C  10/27/2023 4:04 PM  Pt seen and examined    I agree with findings as noted above by L Salen   Pt is a 48 yo with hx of CAD (remote CABG), ICM, IVDA, MSSA bacteremia, VT, CVA (s/p tpa and thrombectomy 2018), hep C, CKD and noncompliance  Pt last seen [facility] heart care in 2021  Echo in 2021 LVEF <20%.    Pt presented to ER with LOC after heroin injection    Pt also had worsening SOB, edema, N/V.     BNP elevated    CXR with pulmonary edema  ON exam, pt in NAD     Neck   JVP is increased   No bruits Lungs relatively clear Cardiac exam    RRR Occasional skip    +S3  II/VI systolic murmur LSB Abd    Supple      Nontender Ext   2+ LE edema    Feet sl cool   Skin   multiple sites for skin popping   HFrEF     Continue IV  lasix      Losartan  added low dose    Follow BP and renal function Check lactic acid    Continue to follow I/O, renal function.     With hx cocaine  use on decompensated CHF hold b blocker Rx     Echo ordered  With hx noncompliance and continued drug use, pt is  not a candidate for advanced therapies   NSVT   Pt with ectopy on telemetry    WIll continue to track      He had been on amiodarone  after ICD removed   Not on amio now   CAD   REmote CABG and intervention No symptoms of angina.  Vina Gull MD

## 2023-10-27 NOTE — ED Notes (Signed)
 Notified provider of 6 beat run of Vtach.

## 2023-10-27 NOTE — H&P (Signed)
 History and Physical    Patient: John Davenport FMW:[id number] DOB: [birth year] DOA: 10/27/2023 DOS: the patient was seen and examined on 10/27/2023 PCP: Pcp, No  Patient coming from: Home.  Independently ambulates at baseline.  Lives with mother.  Chief Complaint:  Chief Complaint  Patient presents with   Loss of Consciousness   HPI: John Davenport is a 48 y.o. male with PMH of HFrEF with EF less than 20% in 2021, prior ICD s/p extraction, CAD, CVA, CKD-3, hep C, paroxysmal VT, polysubstance use (IV heroin, cocaine  and tobacco) and noncompliance with medication presented to ED after fall at home.  Patient reports progressive dyspnea, DOE, orthopnea and lower extremity swelling for 2 to 3 weeks.  He fell forward out of his chair this morning and hit his head against the fireplace.  Patient's mother states that he was kind of drowsy while sitting in chair.  Not sure about LOC.  Denies palpitation or any prodromes leading to this.  He has stopped taking medications and going to the doctor since his ICD was removed.  He says he did not think he needs to take medications or go to the doctor since he felt well after ICD removal.  He admits to injecting heroin.  Last injection earlier this morning.  He denies cocaine  use although his UDS is positive.  He denies chest pain, fever, chills or cough.  He denies daily nausea and vomiting.  He had 2 episodes of emesis this morning.  He denies hematemesis.  He denies diarrhea, melena or hematochezia.  He denies UTI symptoms, headache or focal neurosymptoms.  Patient lives with mother.  Independently ambulates at baseline.  Smokes about a pack a day.  Denies alcohol.  Admits to injecting heroin.  Interested in cardiopulmonary resuscitation in event of sudden cardiopulmonary arrest.  In ED, vital stable. Na 129. Cr 1.23.  BUN 41.  Total protein 8.2.  Albumin  3.0.  Hgb 11.7.  BNP > 4500.  Troponin 19 and 20.  Lipase normal.  UDS positive for opiate and  cocaine .  CT head without acute finding.  CXR with cardiomegaly and pulmonary edema.  EKG sinus rhythm with right atrial enlargement.  Patient was started on IV Lasix  and admission requested for possible syncope and CHF exacerbation.  Review of Systems: As mentioned in the history of present illness. All other systems reviewed and are negative. Past Medical History:  Diagnosis Date   Anxiety state, unspecified    Back pain    Cancer (HCC)    Lung CA   Chronic systolic heart failure (HCC)    a. Ischemic CM (EF 30% by echo in 2011) b. echo 08/2015: EF 20% with severe HK and AK of the inferoseptal and apical myocardium.   Coronary artery disease    a. 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  b. eventually required single vessel CABG (L-LAD) in 03/2009 due to restenosis. c. cath 2013 showed patent LIMA sequential to the diags with LAD filling retrograde, no new disease in RCA and Cx, low LVEDP, sx felt noncardiac. d. 08/2015: NSTEMI: CTO of prox LAD, 99% stenosis of small dCx (no stent), + for cocaine    Esophageal reflux    Heart attack (HCC)    History of hepatitis C virus infection 07/15/2018   07/20/18 Viral RNA negative suggest past, cleared infection   Hypercholesteremia    ICD (implantable cardioverter-defibrillator) infection (HCC)    a. [employer] ICD 12/2008.  Ischemic cardiomyopathy    echocardiogram 12/11: Mild LVH, EF 30-35%, anteroseptal and apical akinesis, grade 1 diastolic dysfunction   LBP (low back pain)    OSA (obstructive sleep apnea) 12/30/2010   Paroxysmal ventricular tachycardia (HCC)    Stroke (HCC)    Tobacco abuse    Past Surgical History:  Procedure Laterality Date   BUBBLE STUDY  11/30/2019   Procedure: BUBBLE STUDY;  Surgeon: Loni Soyla LABOR, MD;  Location: [facility] ENDOSCOPY;  Service: Cardiology;;   CARDIAC CATHETERIZATION N/A 09/06/2015   Procedure: Left Heart Cath and Cors/Grafts Angiography;  Surgeon: Ozell Fell, MD;   Location: [facility] INVASIVE CV LAB;  Service: Cardiovascular;  Laterality: N/A;   CARDIAC DEFIBRILLATOR PLACEMENT     CARDIAC DEFIBRILLATOR PLACEMENT     2010 by [facility]   CORONARY ANGIOPLASTY WITH STENT PLACEMENT     LAD stenting 10/09 and re-opening 6/10   CORONARY ARTERY BYPASS GRAFT     1/11   ICD LEAD REMOVAL Left 07/16/2018   Procedure: ICD LEAD REMOVAL;  Surgeon: Waddell Danelle ORN, MD;  Location: [facility] OR;  Service: Cardiovascular;  Laterality: Left;  Owen back up   IR GENERIC HISTORICAL  05/21/2016   IR PERCUTANEOUS ART THROMBECTOMY/INFUSION INTRACRANIAL INC DIAG ANGIO 05/21/2016 Thyra Nash, MD [facility]-INTERV RAD   IR GENERIC HISTORICAL  06/16/2016   IR RADIOLOGIST EVAL & MGMT 06/16/2016 [facility]INTER[facility] RAD   LEFT HEART CATHETERIZATION WITH CORONARY ANGIOGRAM N/A 08/07/2011   Procedure: LEFT HEART CATHETERIZATION WITH CORONARY ANGIOGRAM;  Surgeon: Maude JAYSON Emmer, MD;  Location: [facility] CATH LAB;  Service: Cardiovascular;  Laterality: N/A;   RADIOLOGY WITH ANESTHESIA N/A 05/21/2016   Procedure: RADIOLOGY WITH ANESTHESIA;  Surgeon: Thyra Nash, MD;  Location: [facility] OR;  Service: Radiology;  Laterality: N/A;   RIGHT/LEFT HEART CATH AND CORONARY ANGIOGRAPHY N/A 02/03/2018   Procedure: RIGHT/LEFT HEART CATH AND CORONARY ANGIOGRAPHY;  Surgeon: Rolan Ezra RAMAN, MD;  Location: [facility] INVASIVE CV LAB;  Service: Cardiovascular;  Laterality: N/A;   TEE WITHOUT CARDIOVERSION N/A 07/16/2018   Procedure: TRANSESOPHAGEAL ECHOCARDIOGRAM (TEE);  Surgeon: Waddell Danelle ORN, MD;  Location: [facility] OR;  Service: Cardiovascular;  Laterality: N/A;   TEE WITHOUT CARDIOVERSION N/A 11/30/2019   Procedure: TRANSESOPHAGEAL ECHOCARDIOGRAM (TEE);  Surgeon: Loni Soyla LABOR, MD;  Location: [facility] ENDOSCOPY;  Service: Cardiology;  Laterality: N/A;   Social History:  reports that he has been smoking cigarettes. He has a 10 pack-year smoking history. He has quit using smokeless tobacco.  His smokeless tobacco use included chew. He reports current drug use.  Drug: Cocaine . He reports that he does not drink alcohol.  No Known Allergies  Family History  Problem Relation Age of Onset   Heart attack Father 41       died at 54 after multiple MI's   COPD Maternal Grandmother     Prior to Admission medications   Medication Sig Start Date End Date Taking? Authorizing Provider  furosemide  (LASIX ) 40 MG tablet Take 1 tablet (40 mg total) by mouth 2 (two) times daily. With extra 40 mg as needed for 3 pound weight gain. Patient taking differently: Take 40 mg by mouth daily as needed for edema or fluid. With extra 40 mg as needed for 3 pound weight gain. 03/19/20  Yes Milford, Harlene HERO, FNP  amiodarone  (PACERONE ) 200 MG tablet Take 1 tablet (200 mg total) by mouth daily. Patient not taking: Reported on 10/27/2023 03/19/20   Glena Harlene HERO, FNP  buprenorphine -naloxone  (SUBOXONE ) 8-2 mg SUBL SL tablet Place 1 tablet under  the tongue 2 (two) times daily. Patient not taking: Reported on 10/27/2023 03/30/20   Jerrell Cleatus Ned, MD  carvedilol  (COREG ) 3.125 MG tablet Take 1 tablet (3.125 mg total) by mouth 2 (two) times daily. Patient not taking: Reported on 10/27/2023 03/19/20 03/19/21  Glena Harlene HERO, FNP  dapagliflozin  propanediol (FARXIGA ) 10 MG TABS tablet Take 1 tablet (10 mg total) by mouth daily. Patient not taking: Reported on 10/27/2023 03/19/20   Glena Harlene HERO, FNP  digoxin  (LANOXIN ) 0.125 MG tablet Take 1 tablet (0.125 mg total) by mouth daily. Patient not taking: Reported on 10/27/2023 03/19/20   Glena Harlene HERO, FNP  rivaroxaban  (XARELTO ) 20 MG TABS tablet Take 1 tablet (20 mg total) by mouth daily with supper. Patient not taking: Reported on 10/27/2023 03/19/20   Glena Harlene HERO, FNP  rosuvastatin  (CRESTOR ) 20 MG tablet TAKE 1 TABLET (20 MG TOTAL) BY MOUTH DAILY. Patient not taking: Reported on 10/27/2023 03/02/20 03/02/21  Lenetta No D, NP  sacubitril -valsartan  (ENTRESTO ) 24-26 MG Take 1 tablet by mouth 2 (two) times  daily. Patient not taking: Reported on 10/27/2023 03/19/20   Glena Harlene HERO, FNP  spironolactone  (ALDACTONE ) 25 MG tablet Take 1 tablet (25 mg total) by mouth daily. Patient not taking: Reported on 10/27/2023 03/19/20   Glena Harlene HERO, FNP    Physical Exam: Vitals:   10/27/23 1330 10/27/23 1345 10/27/23 1415 10/27/23 1417  BP: 104/78 (!) 112/90 (!) 119/93   Pulse: 94 97    Resp: 16 13 15    Temp:    (!) 97.4 F (36.3 C)  TempSrc:    Oral  SpO2: 99%     Weight:      Height:       GENERAL: No apparent distress.  Nontoxic. HEENT: MMM.  Vision and hearing grossly intact.  NECK: Supple.  No JVD. RESP:  No IWOB.  Fair aeration bilaterally.  Bibasilar Rales. CVS:  RRR.  2/6 SEM over apex. ABD/GI/GU: BS+. Abd soft, NTND.  MSK/EXT:   No apparent deformity. Moves extremities.  Warm plus BLE edema. SKIN: no apparent skin lesion or wound NEURO: Awake and alert. Oriented appropriately.  No apparent focal neuro deficit. PSYCH: Calm. Normal affect.  Data Reviewed: All review of system negative except for pertinent positives and negatives as history of present illness above.  Assessment and Plan: Acute on chronic combined CHF: Presents with progressive dyspnea, DOE, orthopnea and edema for over 3 weeks.  TTE in 2021 with LVEF <20%, G2 DD, moderate MR. Noncompliant with meds.  Has not taken any medication for years.  JVD, edema and Rales on exam.  BNP > 4500.  CXR with cardiomegaly and pulmonary edema.  UDS positive for opiate and cocaine .  Started on IV Lasix  in ED - Continue IV Lasix  40 mg twice daily - Cardiology consult - Low-dose losartan  -Echocardiogram - Strict intake and output, daily weight, renal functions and electrolytes  Fall at home-reportedly drowsy before he fell out of chair.  Likely from polysubstance use.  CT head without acute finding.  Small laceration of scalp without active bleeding.  Doubt syncope. -Echocardiogram as above - Fall precaution - PT/OT  eval  History of paroxysmal VT: Supposed to be on amiodarone  and Xarelto  but has not taken these medications in years.  Currently in sinus rhythm. -Defer to cardiology -Optimize electrolytes  IV drug use: Reports injecting heroin.  Denies cocaine  use although UDS positive. -Counseled on the importance of refraining from substance use -Consider Suboxone  if interested -COWS - Blood  culture  Hyponatremia: Likely hypervolemic. - Diuretics as above  History of CVA: No focal neurodeficit. - Resume home Crestor  after med rec  History of CAD: No chest pain. - Cardiac meds as above  Tobacco use disorder: Reports smoking about a pack a day. - Encouraged smoking cessation. - Not interested in nicotine  patch.     Advance Care Planning:   Code Status: Full Code   Consults: Cardiology  Family Communication: Updated patient's mother and aunt at bedside  Severity of Illness: The appropriate patient status for this patient is INPATIENT. Inpatient status is judged to be reasonable and necessary in order to provide the required intensity of service to ensure the patient's safety. The patient's presenting symptoms, physical exam findings, and initial radiographic and laboratory data in the context of their chronic comorbidities is felt to place them at high risk for further clinical deterioration. Furthermore, it is not anticipated that the patient will be medically stable for discharge from the hospital within 2 midnights of admission.   * I certify that at the point of admission it is my clinical judgment that the patient will require inpatient hospital care spanning beyond 2 midnights from the point of admission due to high intensity of service, high risk for further deterioration and high frequency of surveillance required.*  Author: Mignon ONEIDA Bump, MD 10/27/2023 3:43 PM  For on call review [email or web address].

## 2023-10-27 NOTE — Plan of Care (Signed)

## 2023-10-27 NOTE — ED Notes (Signed)
 1 site cultures taken due to patient difficulty in sticking. Even IV team could only get a 22G in left forearm.

## 2023-10-28 ENCOUNTER — Inpatient Hospital Stay (HOSPITAL_COMMUNITY)

## 2023-10-28 DIAGNOSIS — N1831 Chronic kidney disease, stage 3a: Secondary | ICD-10-CM

## 2023-10-28 DIAGNOSIS — I5023 Acute on chronic systolic (congestive) heart failure: Secondary | ICD-10-CM | POA: Diagnosis not present

## 2023-10-28 DIAGNOSIS — E785 Hyperlipidemia, unspecified: Secondary | ICD-10-CM | POA: Diagnosis not present

## 2023-10-28 DIAGNOSIS — I5021 Acute systolic (congestive) heart failure: Secondary | ICD-10-CM | POA: Diagnosis not present

## 2023-10-28 DIAGNOSIS — Z8673 Personal history of transient ischemic attack (TIA), and cerebral infarction without residual deficits: Secondary | ICD-10-CM

## 2023-10-28 DIAGNOSIS — I5043 Acute on chronic combined systolic (congestive) and diastolic (congestive) heart failure: Secondary | ICD-10-CM | POA: Diagnosis not present

## 2023-10-28 DIAGNOSIS — I2510 Atherosclerotic heart disease of native coronary artery without angina pectoris: Secondary | ICD-10-CM | POA: Diagnosis not present

## 2023-10-28 LAB — BASIC METABOLIC PANEL WITH GFR
Anion gap: 17 — ABNORMAL HIGH (ref 5–15)
BUN: 38 mg/dL — ABNORMAL HIGH (ref 6–20)
CO2: 23 mmol/L (ref 22–32)
Calcium: 8.8 mg/dL — ABNORMAL LOW (ref 8.9–10.3)
Chloride: 91 mmol/L — ABNORMAL LOW (ref 98–111)
Creatinine, Ser: 1.14 mg/dL (ref 0.61–1.24)
GFR, Estimated: >60 mL/min (ref >60)
Glucose, Bld: 71 mg/dL (ref 70–99)
Potassium: 3.7 mmol/L (ref 3.5–5.1)
Sodium: 131 mmol/L — ABNORMAL LOW (ref 135–145)

## 2023-10-28 LAB — ECHOCARDIOGRAM COMPLETE
Area-P 1/2: 5.97 cm2
Calc EF: 25.4 %
Height: 75 in
S' Lateral: 6.2 cm
Single Plane A2C EF: 30 %
Single Plane A4C EF: 26.1 %
Weight: 2569.6 [oz_av]

## 2023-10-28 LAB — CBC
HCT: 43.5 % (ref 39.0–52.0)
Hemoglobin: 13.5 g/dL (ref 13.0–17.0)
MCH: 24 pg — ABNORMAL LOW (ref 26.0–34.0)
MCHC: 31 g/dL (ref 30.0–36.0)
MCV: 77.4 fL — ABNORMAL LOW (ref 80.0–100.0)
Platelets: 252 K/uL (ref 150–400)
RBC: 5.62 MIL/uL (ref 4.22–5.81)
RDW: 17 % — ABNORMAL HIGH (ref 11.5–15.5)
WBC: 11.8 K/uL — ABNORMAL HIGH (ref 4.0–10.5)
nRBC: 0 % (ref 0.0–0.2)

## 2023-10-28 LAB — LIPID PANEL
Cholesterol: 74 mg/dL (ref 0–200)
HDL: 16 mg/dL — ABNORMAL LOW (ref >40)
LDL Cholesterol: 50 mg/dL (ref 0–99)
Total CHOL/HDL Ratio: 4.6 ratio
Triglycerides: 41 mg/dL (ref <150)
VLDL: 8 mg/dL (ref 0–40)

## 2023-10-28 LAB — MAGNESIUM: Magnesium: 1.8 mg/dL (ref 1.7–2.4)

## 2023-10-28 MED ORDER — LOSARTAN POTASSIUM 25 MG PO TABS
12.5000 mg | ORAL_TABLET | Freq: Every day | ORAL | Status: DC
  Administered 2023-10-29: 12.5 mg via ORAL
  Filled 2023-10-28: qty 1

## 2023-10-28 MED ORDER — PERFLUTREN LIPID MICROSPHERE
1.0000 mL | INTRAVENOUS | Status: AC | PRN
  Administered 2023-10-28: 1.5 mL via INTRAVENOUS

## 2023-10-28 MED ORDER — ROSUVASTATIN CALCIUM 20 MG PO TABS
20.0000 mg | ORAL_TABLET | Freq: Every day | ORAL | Status: DC
  Administered 2023-10-28 – 2023-10-29 (×2): 20 mg via ORAL
  Filled 2023-10-28 (×2): qty 1

## 2023-10-28 MED ORDER — POTASSIUM CHLORIDE CRYS ER 20 MEQ PO TBCR
40.0000 meq | EXTENDED_RELEASE_TABLET | Freq: Once | ORAL | Status: AC
  Administered 2023-10-28: 40 meq via ORAL
  Filled 2023-10-28: qty 2

## 2023-10-28 MED ORDER — MAGNESIUM SULFATE 2 GM/50ML IV SOLN
2.0000 g | Freq: Once | INTRAVENOUS | Status: AC
  Administered 2023-10-28: 2 g via INTRAVENOUS
  Filled 2023-10-28: qty 50

## 2023-10-28 NOTE — Assessment & Plan Note (Addendum)
 Hyponatremia,   Renal function with serum cr at 1,14 with K at 3,7 and serum bicarbonate at 23  Na 131 and Mg 1,8   Add 40 meq kcl and 2 g mag sulfate to prevent hypokalemia and hypomagnesemia.  Follow up renal function and electrolytes in am.

## 2023-10-28 NOTE — Evaluation (Signed)
 Physical Therapy Evaluation Patient Details Name: John Davenport MRN: [id number] DOB: 1975-10-31 Today's Date: 10/28/2023  History of Present Illness  Pt is a 48 y.o. male presenting 7/29 for syncopal episode and SOB, causing him to hit his head on a fireplace. CT of head negative for acute abnormality. Admitted for acute CHF exacerbation. UDS positive for cocaine  and heroin on admission. PMH: polysubstance abuse, CAD, CKD, CHF, prior ICD s/p extraction, CVA   Clinical Impression  Pt in bed upon arrival of PT, agreeable to evaluation at this time. Prior to admission the pt was independent with all mobility and ADLs, living with his mother who completes all IADLs, driving, and medication management. The pt reports mostly sedentary in chair or bed and leaving house infrequently. He was able to demo good endurance with mobility, but mild instability with addition of balance challenge. Pt with greatest difficulty with ambulating with narrow BOS, head movements, and stepping over objects. The pt reports he is at his functional baseline and is ready to return home. Pt encouraged to increase activity levels after d/c and pt verbalized understanding. No further acute PT needs identified at this time.   Dynamic Gait Index (DGI): 19/24 (<19 indicates increased risk for falls) Gait Speed: 0.67m/s (Gait speed < 1.76m/s indicates increased risk of falls)    If plan is discharge home, recommend the following: Direct supervision/assist for medications management;Help with stairs or ramp for entrance;Assist for transportation   Can travel by private vehicle        Equipment Recommendations None recommended by PT  Recommendations for Other Services       Functional Status Assessment Patient has not had a recent decline in their functional status     Precautions / Restrictions Precautions Precautions: Fall Recall of Precautions/Restrictions: Intact Restrictions Weight Bearing Restrictions Per  Provider Order: No      Mobility  Bed Mobility Overal bed mobility: Independent             General bed mobility comments: HOB slightly elevated    Transfers Overall transfer level: Independent Equipment used: None               General transfer comment: no assist or need for UE support    Ambulation/Gait Ambulation/Gait assistance: Supervision Gait Distance (Feet): 400 Feet Assistive device: None Gait Pattern/deviations: Step-through pattern, Decreased stride length, Drifts right/left Gait velocity: 0.91 m/s Gait velocity interpretation: >2.62 ft/sec, indicative of community ambulatory   General Gait Details: pt with mild sway but no overt LOB, VSS  Stairs Stairs: Yes Stairs assistance: Contact guard assist Stair Management: One rail Left, Alternating pattern, Forwards Number of Stairs: 8       Balance Overall balance assessment: Mild deficits observed, not formally tested                   Tandem Stance - Right Leg: 25 Tandem Stance - Left Leg: 20 Rhomberg - Eyes Opened: 30 Rhomberg - Eyes Closed: 30     Standardized Balance Assessment Standardized Balance Assessment : Dynamic Gait Index   Dynamic Gait Index Level Surface: Mild Impairment Change in Gait Speed: Normal Gait with Horizontal Head Turns: Mild Impairment Gait with Vertical Head Turns: Mild Impairment Gait and Pivot Turn: Normal Step Over Obstacle: Mild Impairment Step Around Obstacles: Normal Steps: Mild Impairment Total Score: 19       Pertinent Vitals/Pain Pain Assessment Pain Assessment: No/denies pain    Home Living Family/patient expects to be discharged to:: Private residence Living  Arrangements: Parent Available Help at Discharge: Family;Available 24 hours/day Type of Home: House Home Access: Ramped entrance       Home Layout: One level Home Equipment: Shower seat;Grab bars - tub/shower;Rolling Walker (2 wheels);Cane - single point;BSC/3in1      Prior  Function Prior Level of Function : Independent/Modified Independent             Mobility Comments: No AD, no falls. reports largely sedentary in chair or bed with no consistent exercise and rarely leaving house ADLs Comments: pt reports independent with ADLs, mom does IADLs, does not work or drive     Extremity/Trunk Assessment   Upper Extremity Assessment Upper Extremity Assessment: Overall WFL for tasks assessed    Lower Extremity Assessment Lower Extremity Assessment: Overall WFL for tasks assessed    Cervical / Trunk Assessment Cervical / Trunk Assessment: Normal  Communication   Communication Communication: No apparent difficulties    Cognition Arousal: Alert Behavior During Therapy: WFL for tasks assessed/performed   PT - Cognitive impairments: No apparent impairments                       PT - Cognition Comments: slightly impaired safety awareness, but likely at baseline Following commands: Intact       Cueing Cueing Techniques: Verbal cues     General Comments General comments (skin integrity, edema, etc.): VSS    Exercises     Assessment/Plan    PT Assessment Patient does not need any further PT services         PT Goals (Current goals can be found in the Care Plan section)  Acute Rehab PT Goals Patient Stated Goal: to return home PT Goal Formulation: All assessment and education complete, DC therapy Time For Goal Achievement: 11/04/23 Potential to Achieve Goals: Good     AM-PAC PT 6 Clicks Mobility  Outcome Measure Help needed turning from your back to your side while in a flat bed without using bedrails?: None Help needed moving from lying on your back to sitting on the side of a flat bed without using bedrails?: None Help needed moving to and from a bed to a chair (including a wheelchair)?: None Help needed standing up from a chair using your arms (e.g., wheelchair or bedside chair)?: None Help needed to walk in hospital room?:  A Little Help needed climbing 3-5 steps with a railing? : A Little 6 Click Score: 22    End of Session Equipment Utilized During Treatment: Gait belt Activity Tolerance: Patient tolerated treatment well Patient left: in bed;with call bell/phone within reach Nurse Communication: Mobility status PT Visit Diagnosis: Unsteadiness on feet (R26.81)    Time: 9140-9080 PT Time Calculation (min) (ACUTE ONLY): 20 min   Charges:   PT Evaluation $PT Eval Low Complexity: 1 Low   PT General Charges $$ ACUTE PT VISIT: 1 Visit         Izetta Call, PT, DPT   Acute Rehabilitation Department Office [phone] Secure Chat Communication Preferred  Izetta JULIANNA Call 10/28/2023, 9:28 AM

## 2023-10-28 NOTE — Progress Notes (Signed)
 Progress Note   Patient: John Davenport FMW:[id number] DOB: [birth year] DOA: 10/27/2023     1 DOS: the patient was seen and examined on 10/28/2023   Brief hospital course: John Davenport was admitted to the hospital with the working diagnosis of heart failure decompensation   48 yo male with the past medical history of heart failure, coronary artery disease, CKD, hepatitis C, paroxysmal ventricular tachycardia and polysubstance abuse who presented with altered mental status. Reported 2 to 3 weeks of worsening dyspnea on exertion, orthopnea and lower extremity edema. On the day of admission he was noted to have altered mentation, apparently he became less responsive and hit his head on the fireplace, producing a head laceration. He has been not taking his medications and he has been using heroin and cocaine .  On his initial physical examination his blood pressure was 112/90, HR 97 and RR 15, awake and alert, lungs with bilateral rales with no wheezing, heart with S1 and S2 present and regular, positive systolic murmur at the apex, abdomen with no distention and non tender, positive bilateral lower extremity edema.     Assessment and Plan: * Acute on chronic systolic CHF (congestive heart failure) (HCC) Echocardiogram with reduced LV systolic function 20 to 25%, mild to moderate LV cavity enlargement, possible small calcified thrombus in the LV apex, RV with moderate reduction in systolic function, mild RV dilatation, LA with moderate dilatation, moderate tricuspid valve regurgitation,   Documented urine output is 1000 ml Systolic blood pressure 80 to 90 mmHg range.   Patient had IV furosemide  this morning.  Started on losartan  for afterload reduction, to consider spironolactone  and SGLT 2 inh.  Holding b blocker due to acute decompensation.   Limited medical therapy due to hypotension. To consider digoxin .   CAD in native artery High sensitive troponin elevation due to heart failure, no  acute coronary syndrome.   CKD stage 3a, GFR 45-59 ml/min (HCC) Hyponatremia,   Renal function with serum cr at 1,14 with K at 3,7 and serum bicarbonate at 23  Na 131 and Mg 1,8   Add 40 meq kcl and 2 g mag sulfate to prevent hypokalemia and hypomagnesemia.  Follow up renal function and electrolytes in am.    Hyperlipidemia Continue statin therapy   History of CVA (cerebrovascular accident) Continue blood pressure control, resume statin therapy.   History of hepatitis C virus infection Follow up as outpatient   Polysubstance abuse (HCC) IV drug use,  No signs of active withdrawal, continue neuro checks per unit protocol.         Subjective: Patient is somnolent but easy to arouse, no chest pain, improving dyspnea and lower extremity edema,   Physical Exam: Vitals:   10/28/23 0428 10/28/23 0746 10/28/23 1146 10/28/23 1306  BP: (!) 84/59 (!) 83/51 (!) 82/56 (!) 85/58  Pulse: 69 77 72   Resp: 20 20 20    Temp: (!) 97.3 F (36.3 C) 97.8 F (36.6 C)    TempSrc: Oral Oral    SpO2: 95% 94% 96%   Weight:      Height:       Neurology somnolent but easy to arouse, responds to questions and follows commands ENT with mild pallor with no icterus Cardiovascular with S1 and S2 present and regular with no gallops or rubs, positive systolic murmur at the right lower sternal border Respiratory with no rales or wheezing, no rhonchi  Abdomen with no distention  Trace lower extremity edema  Data Reviewed:   Family  Communication: I spoke with patient's family at the bedside, we talked in detail about patient's condition, plan of care and prognosis and all questions were addressed.   Disposition: Status is: Inpatient Remains inpatient appropriate because: recovering heart failure exacerbation   Planned Discharge Destination: Home     Author: Elidia Toribio Furnace, MD 10/28/2023 3:33 PM  For on call review [email or web address].

## 2023-10-28 NOTE — Plan of Care (Signed)
  Problem: Education: Goal: Knowledge of General Education information will improve Description: Including pain rating scale, medication(s)/side effects and non-pharmacologic comfort measures Outcome: Progressing   Problem: Clinical Measurements: Goal: Will remain free from infection Outcome: Progressing Goal: Respiratory complications will improve Outcome: Progressing   Problem: Elimination: Goal: Will not experience complications related to urinary retention Outcome: Progressing   Problem: Pain Managment: Goal: General experience of comfort will improve and/or be controlled Outcome: Progressing   Problem: Skin Integrity: Goal: Risk for impaired skin integrity will decrease Outcome: Not Progressing

## 2023-10-28 NOTE — Evaluation (Signed)
 Occupational Therapy Evaluation Patient Details Name: John Davenport MRN: [id number] DOB: 12-02-1975 Today's Date: 10/28/2023   History of Present Illness   Pt is a 48 y.o. male presenting 7/29 for syncopal episode and SOB, causing him to hit his head on a fireplace. CT of head negative for acute abnormality. Admitted for acute CHF exacerbation. UDS positive for cocaine  and heroin on admission. PMH: polysubstance abuse, CAD, CKD, CHF, prior ICD s/p extraction, CVA     Clinical Impressions Pt admitted based on above, and was seen based on problem list below. PTA pt was independent with ADLs. Today pt is at his functional baseline for ADLs. Completing standing toileting and grooming task independently. Pt able to mobilize in hallway without an AD, no LOB, pt reporting he feels good mobilizing. Pt maintaining soft BP throughout session, per NT, pt's BP has been soft since 4am. Pt reporting no symptoms of lightheadedness or dizziness. Educated pt on importance of mobilizing only with staff d/t high risk for falls with soft BP. Pt reporting no concerns with managing self-care at home, no DME or follow up OT needs. All education complete, no further acute OT needs. OT is signing off on this pt.        If plan is discharge home, recommend the following:   Assistance with cooking/housework     Functional Status Assessment   Patient has not had a recent decline in their functional status     Equipment Recommendations   None recommended by OT     Recommendations for Other Services         Precautions/Restrictions   Precautions Precautions: Fall Recall of Precautions/Restrictions: Intact Restrictions Weight Bearing Restrictions Per Provider Order: No     Mobility Bed Mobility Overal bed mobility: Independent     General bed mobility comments: HOB slightly elevated    Transfers Overall transfer level: Independent Equipment used: None     General transfer  comment: Mobilized in hallway 100+ft no AD, no LOB, pt reporting at baseline      Balance Overall balance assessment: Mild deficits observed, not formally tested     ADL either performed or assessed with clinical judgement   ADL Overall ADL's : At baseline;Modified independent   General ADL Comments: Standing ADLs completed, no LOB or SOB, pt reporting he is at baseline     Vision Baseline Vision/History: 0 No visual deficits Patient Visual Report: No change from baseline Vision Assessment?: No apparent visual deficits            Pertinent Vitals/Pain Pain Assessment Pain Assessment: No/denies pain     Extremity/Trunk Assessment Upper Extremity Assessment Upper Extremity Assessment: Overall WFL for tasks assessed   Lower Extremity Assessment Lower Extremity Assessment: Overall WFL for tasks assessed   Cervical / Trunk Assessment Cervical / Trunk Assessment: Normal   Communication Communication Communication: No apparent difficulties   Cognition Arousal: Alert Behavior During Therapy: WFL for tasks assessed/performed Cognition: No apparent impairments             OT - Cognition Comments: Likely poor safety awareness and judgment at baseline                 Following commands: Intact       Cueing  General Comments   Cueing Techniques: Verbal cues  Pt maintaing soft bp during session, per NT, pt maintaining soft BP this AM. Pt reporting no symptoms of lightheadedness or dizziness           Home  Living Family/patient expects to be discharged to:: Private residence Living Arrangements: Parent Available Help at Discharge: Family;Available 24 hours/day Type of Home: House Home Access: Ramped entrance     Home Layout: One level     Bathroom Shower/Tub: Producer, television/film/video: Handicapped height Bathroom Accessibility: Yes How Accessible: Accessible via walker Home Equipment: Shower seat;Grab bars - tub/shower;Rolling Walker (2  wheels);Cane - single point;BSC/3in1          Prior Functioning/Environment Prior Level of Function : Independent/Modified Independent       Mobility Comments: No AD      OT Problem List: Cardiopulmonary status limiting activity;Decreased safety awareness        OT Goals(Current goals can be found in the care plan section)   Acute Rehab OT Goals Patient Stated Goal: To get fluid off OT Goal Formulation: All assessment and education complete, DC therapy Time For Goal Achievement: 11/11/23 Potential to Achieve Goals: Good   AM-PAC OT 6 Clicks Daily Activity     Outcome Measure Help from another person eating meals?: None Help from another person taking care of personal grooming?: None Help from another person toileting, which includes using toliet, bedpan, or urinal?: None Help from another person bathing (including washing, rinsing, drying)?: None Help from another person to put on and taking off regular upper body clothing?: None Help from another person to put on and taking off regular lower body clothing?: None 6 Click Score: 24   End of Session Equipment Utilized During Treatment: Gait belt Nurse Communication: Mobility status  Activity Tolerance: Patient tolerated treatment well Patient left: in bed;with call bell/phone within reach;with bed alarm set  OT Visit Diagnosis: History of falling (Z91.81)                Time: 9257-9241 OT Time Calculation (min): 16 min Charges:  OT General Charges $OT Visit: 1 Visit OT Evaluation $OT Eval Moderate Complexity: 1 Mod  Adrianne BROCKS, OT  Acute Rehabilitation Services Office [phone] Secure chat preferred   Adrianne GORMAN Savers 10/28/2023, 8:13 AM

## 2023-10-28 NOTE — Assessment & Plan Note (Signed)
 IV drug use,  No signs of active withdrawal, continue neuro checks per unit protocol.

## 2023-10-28 NOTE — Assessment & Plan Note (Signed)
 Echocardiogram with reduced LV systolic function 20 to 25%, mild to moderate LV cavity enlargement, possible small calcified thrombus in the LV apex, RV with moderate reduction in systolic function, mild RV dilatation, LA with moderate dilatation, moderate tricuspid valve regurgitation,   Documented urine output is 1000 ml Systolic blood pressure 80 to 90 mmHg range.   Patient had IV furosemide  this morning.  Started on losartan  for afterload reduction, to consider spironolactone  and SGLT 2 inh.  Holding b blocker due to acute decompensation.   Limited medical therapy due to hypotension. To consider digoxin .

## 2023-10-28 NOTE — Assessment & Plan Note (Signed)
 Continue statin therapy.

## 2023-10-28 NOTE — Progress Notes (Addendum)
 Rounding Note   Patient Name: John Davenport Date of Encounter: 10/28/2023  [facility] Cardiologist: Last seen 2021   Subjective  Pt feeling much better   Able to sleep   COuld breathe   Scheduled Meds:  enoxaparin  (LOVENOX ) injection  40 mg Subcutaneous Q24H   furosemide   40 mg Intravenous Q12H   losartan   25 mg Oral Daily   sodium chloride  flush  3 mL Intravenous Q12H   Continuous Infusions:  sodium chloride      PRN Meds: sodium chloride , acetaminophen , ondansetron  (ZOFRAN ) IV, sodium chloride  flush   Vital Signs  Vitals:   10/28/23 0046 10/28/23 0400 10/28/23 0428 10/28/23 0746  BP: 95/69  (!) 84/59 (!) 83/51  Pulse: 79  69 77  Resp: 20  20 20   Temp: 98.6 F (37 C)  (!) 97.3 F (36.3 C) 97.8 F (36.6 C)  TempSrc: Oral  Oral Oral  SpO2: 96%  95% 94%  Weight:  72.8 kg    Height:        Intake/Output Summary (Last 24 hours) at 10/28/2023 0952 Last data filed at 10/28/2023 0900 Gross per 24 hour  Intake 240 ml  Output 1900 ml  Net -1660 ml      10/28/2023    4:00 AM 10/27/2023    6:01 PM 10/27/2023    9:09 AM  Last 3 Weights  Weight (lbs) 160 lb 9.6 oz 159 lb 6.3 oz 160 lb  Weight (kg) 72.848 kg 72.3 kg 72.576 kg      Telemetry SR with PVCs   1 5 beat NSVt  - Personally Reviewed  ECG  No new   - Personally Reviewed  Physical Exam  GEN: Thin 48 yo in no acute distress.   Neck: JVP is increased  Cardiac: RRR,  +S3   Respiratory  Decreased BS at bases  GI: Soft, nontender, non-distended  MS: Tr LE  edema; Skin poppng sites    Labs High Sensitivity Troponin:   Recent Labs  Lab 10/27/23 0916 10/27/23 1213  TROPONINIHS 19* 20*     Chemistry Recent Labs  Lab 10/27/23 0916 10/27/23 1213 10/28/23 0256  NA 129*  --  131*  K 3.5  --  3.7  CL 85*  --  91*  CO2 32  --  23  GLUCOSE 121*  --  71  BUN 41*  --  38*  CREATININE 1.23  --  1.14  CALCIUM  9.3  --  8.8*  MG  --  1.8 1.8  PROT 8.2*  --   --   ALBUMIN  3.0*  --   --    AST 25  --   --   ALT 13  --   --   ALKPHOS 96  --   --   BILITOT 1.3*  --   --   GFRNONAA >60  --  >60  ANIONGAP 12  --  17*    Lipids No results for input(s): CHOL, TRIG, HDL, LABVLDL, LDLCALC, CHOLHDL in the last 168 hours.  Hematology Recent Labs  Lab 10/27/23 0916 10/28/23 0256  WBC 10.3 11.8*  RBC 4.82 5.62  HGB 11.7* 13.5  HCT 38.2* 43.5  MCV 79.3* 77.4*  MCH 24.3* 24.0*  MCHC 30.6 31.0  RDW 16.8* 17.0*  PLT 257 252   Thyroid  No results for input(s): TSH, FREET4 in the last 168 hours.  BNP Recent Labs  Lab 10/27/23 0916  BNP >4,500.0*    DDimer No results for input(s): DDIMER in  the last 168 hours.   Radiology  DG Chest 2 View Result Date: 10/27/2023 CLINICAL DATA:  Chest pain. EXAM: CHEST - 2 VIEW COMPARISON:  Chest radiograph dated 03/01/2020. FINDINGS: There is cardiomegaly with vascular congestion and mild edema. No focal consolidation, pleural effusion, or pneumothorax. Median sternotomy wires and CABG vascular clips. No acute osseous pathology. IMPRESSION: Cardiomegaly with findings of CHF. Electronically Signed   By: Vanetta Chou M.D.   On: 10/27/2023 10:22   CT Head Wo Contrast Result Date: 10/27/2023 CLINICAL DATA:  Provided history: Polytrauma, blunt. EXAM: CT HEAD WITHOUT CONTRAST TECHNIQUE: Contiguous axial images were obtained from the base of the skull through the vertex without intravenous contrast. RADIATION DOSE REDUCTION: This exam was performed according to the departmental dose-optimization program which includes automated exposure control, adjustment of the mA and/or kV according to patient size and/or use of iterative reconstruction technique. COMPARISON:  Brain MRI 03/28/2020.  Head CT 03/28/2020. FINDINGS: Brain: No age-advanced or lobar predominant cerebral atrophy. 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 There is no acute intracranial hemorrhage. No demarcated cortical infarct. No extra-axial fluid  collection. No evidence of an intracranial mass. No midline shift. Vascular: No hyperdense vessel. Atherosclerotic calcifications. Skull: No calvarial fracture or aggressive osseous lesion. Sinuses/Orbits: No mass or acute finding within the imaged orbits. No significant paranasal sinus disease at the imaged levels. IMPRESSION: 1.  No evidence of an acute intracranial abnormality. 2. 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 Electronically Signed   By: Rockey Childs D.O.   On: 10/27/2023 10:08    Cardiac Studies  Echo ordered this admit  Echocardiogram 03/29/20   1. Left ventricular ejection fraction, by estimation, is <20%. The left  ventricle has severely decreased function. The left ventricle demonstrates  global hypokinesis. The left ventricular internal cavity size was severely  dilated. Left ventricular  diastolic parameters are consistent with Grade II diastolic dysfunction  (pseudonormalization).   2. Right ventricular systolic function is normal. The right ventricular  size is normal. There is mildly elevated pulmonary artery systolic  pressure. The estimated right ventricular systolic pressure is 38.0 mmHg.   3. Left atrial size was moderately dilated.   4. The mitral valve is normal in structure. Mild mitral valve  regurgitation. No evidence of mitral stenosis.   5. The tricuspid valve is myxomatous. Tricuspid valve regurgitation is  moderate.   6. The aortic valve is normal in structure. Aortic valve regurgitation is  not visualized. No aortic stenosis is present.   7. The inferior vena cava is normal in size with greater than 50%  respiratory variability, suggesting right atrial pressure of 3 mmHg.   Comparison(s): No current evidence of mural thrombus.    RHC/LHC 09/06/15 LIMA . Sequential LIMA-diagonal 1 and diagonal 2 is widely patent. This graft retrograde fills the LAD Ost LAD to Prox LAD lesion, 100% stenosed. The lesion was previously treated  with a bare metal stent. LM lesion, 50% stenosed. Mid Cx lesion, 99% stenosed.   1. Severe single vessel CAD with total occlusion of the proximal LAD within the previously implanted stent 2. Mild-moderate distal left main disease extending into the proximal circumflex 3. Widely patent dominant RCA 4. Widely patent sequential LIMA-diagonal 1 and 2 also filling the LAD 5. Subtotal occlusion of the distal circumflex into a tiny OM, possible culprit for his NSTEMI 6. Known severe LV dysfunction   Patient Profile   John Davenport is a 48 y.o. male with a hx of  CAD s/p CABG (MI with occlusion LAD 12/2007 tx w BMS, reocclusion of LAD stent 6/10 and LIMA-D1 & D2 1/11 with NSTEMI 6/17 99% stenosis small distal LCX), chronic systolic CHF secondary to ICM, hx of hospitalization for MSSA bacteremia requiring ICD removal 06/2018, paroxysmal ventricular tachycardia on amiodarone , COPD, CVA s/p tPA and mechanical thrombectomy (2/18) thought related to cardio-embolic nature started Xarelto , CKD, and hepatitis C  who is being seen 10/28/2023 for the evaluation of medication noncompliance and HF exacerbation at the request of Mignon Bump MD.   Assessment & Plan   1  HFrEF  pt with known severe LV dysfunction/ICM   Noncompliant.  Lost to follow up  Presented with worsening SOB and edema    Clinically he is much improved from yesterday   Still with some volume overload on exam  I would continue IV lasix  this am   Follow      BP is very low   Will try very low dose losartan   12.5  Follow    WOuld be good to add  spironolactone  if possible       2  CAD  No symtpoms of angina  3   NSVT   Follow   Does not appear to have a large burden of PVCs    ICD removed years ago    4  LIpids   WIll check   Ambulate some and follow    For questions or updates, please contact  [facility] Please consult [email or web address] for contact info under     Signed, Vina Gull, MD  10/28/2023, 9:52 AM

## 2023-10-28 NOTE — Progress Notes (Incomplete)
 Heart Failure Nurse Navigator Progress Note  PCP: Pcp, No PCP-Cardiologist: *** Admission Diagnosis: *** Admitted from: ***  Presentation:   John Davenport presented with ***  ECHO/ LVEF: ***  Clinical Course:  Past Medical History:  Diagnosis Date   Anxiety state, unspecified    Back pain    Cancer (HCC)    Lung CA   Chronic systolic heart failure (HCC)    a. Ischemic CM (EF 30% by echo in 2011) b. echo 08/2015: EF 20% with severe HK and AK of the inferoseptal and apical myocardium.   Coronary artery disease    a. 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  b. eventually required single vessel CABG (L-LAD) in 03/2009 due to restenosis. c. cath 2013 showed patent LIMA sequential to the diags with LAD filling retrograde, no new disease in RCA and Cx, low LVEDP, sx felt noncardiac. d. 08/2015: NSTEMI: CTO of prox LAD, 99% stenosis of small dCx (no stent), + for cocaine    Esophageal reflux    Heart attack (HCC)    History of hepatitis C virus infection 07/15/2018   07/20/18 Viral RNA negative suggest past, cleared infection   Hypercholesteremia    ICD (implantable cardioverter-defibrillator) infection (HCC)    a. [employer] ICD 12/2008.    Ischemic cardiomyopathy    echocardiogram 12/11: Mild LVH, EF 30-35%, anteroseptal and apical akinesis, grade 1 diastolic dysfunction   LBP (low back pain)    OSA (obstructive sleep apnea) 12/30/2010   Paroxysmal ventricular tachycardia (HCC)    Stroke (HCC)    Tobacco abuse      Social History   Socioeconomic History   Marital status: Single    Spouse name: Not on file   Number of children: 1   Years of education: Not on file   Highest education level: Not on file  Occupational History   Occupation: disabled    Employer: UNEMPLOYED  Tobacco Use   Smoking status: Every Day    Current packs/day: 0.50    Average packs/day: 0.5 packs/day for 20.0 years (10.0 ttl pk-yrs)    Types: Cigarettes    Smokeless tobacco: Former    Types: Chew   Tobacco comments:    smoke 0.5 per week  Vaping Use   Vaping status: Never Used  Substance and Sexual Activity   Alcohol use: No   Drug use: Yes    Types: Cocaine    Sexual activity: Not on file  Other Topics Concern   Not on file  Social History Narrative   Pt lives with his mom, brother, and 69 yo son in [place].   Quit tobacco in Jan. 2011 prior to heart surgery (prior -1/2 PPD (down from 1 ppd per day, onset age 68)    alcohol abuse- prior, hasn't drank since age 53   no illicit drug use    Currently unemployed, awaiting disability   Single         Social Drivers of Health   Financial Resource Strain: Low Risk  (01/27/2018)   Overall Financial Resource Strain (CARDIA)    Difficulty of Paying Living Expenses: Not hard at all  Food Insecurity: No Food Insecurity (10/27/2023)   Hunger Vital Sign    Worried About Running Out of Food in the Last Year: Never true    Ran Out of Food in the Last Year: Never true  Transportation Needs: No Transportation Needs (10/27/2023)   PRAPARE - Transportation  Lack of Transportation (Medical): No    Lack of Transportation (Non-Medical): No  Physical Activity: Not on file  Stress: Not on file  Social Connections: Unknown (10/27/2023)   Social Connection and Isolation Panel    Frequency of Communication with Friends and Family: More than three times a week    Frequency of Social Gatherings with Friends and Family: More than three times a week    Attends Religious Services: Never    Database administrator or Organizations: No    Attends Engineer, structural: Never    Marital Status: Not on file   Education Assessment and Provision:  Detailed education and instructions provided on heart failure disease management including the following:  Signs and symptoms of Heart Failure When to call the physician Importance of daily weights Low sodium diet Fluid restriction Medication  management Anticipated future follow-up appointments  Patient education given on each of the above topics.  Patient acknowledges understanding via teach back method and acceptance of all instructions.  Education Materials:  Living Better With Heart Failure Booklet, HF zone tool, & Daily Weight Tracker Tool.  Patient has scale at home: *** Patient has pill box at home: ***    High Risk Criteria for Readmission and/or Poor Patient Outcomes: Heart failure hospital admissions (last 6 months): ***  No Show rate: *** Difficult social situation: *** Demonstrates medication adherence: *** Primary Language: *** Literacy level: ***  Barriers of Care:   ***  Considerations/Referrals:   Referral made to Heart Failure Pharmacist Stewardship: *** Referral made to Heart Failure CSW/NCM TOC: *** Referral made to Heart & Vascular TOC clinic: ***  Items for Follow-up on DC/TOC: ***   ***

## 2023-10-28 NOTE — Hospital Course (Addendum)
 John Davenport was admitted to the hospital with the working diagnosis of heart failure decompensation   48 yo male with the past medical history of heart failure, coronary artery disease, CKD, hepatitis C, paroxysmal ventricular tachycardia and polysubstance abuse who presented with altered mental status. Reported 2 to 3 weeks of worsening dyspnea on exertion, orthopnea and lower extremity edema. On the day of admission he was noted to have altered mentation, apparently he became less responsive and hit his head on the fireplace, producing a head laceration. He has been not taking his medications and he has been using heroin and cocaine .  On his initial physical examination his blood pressure was 112/90, HR 97 and RR 15, awake and alert, lungs with bilateral rales with no wheezing, heart with S1 and S2 present and regular, positive systolic murmur at the apex, abdomen with no distention and non tender, positive bilateral lower extremity edema.

## 2023-10-28 NOTE — Assessment & Plan Note (Signed)
 Follow up as outpatient.

## 2023-10-28 NOTE — Assessment & Plan Note (Signed)
 High sensitive troponin elevation due to heart failure, no acute coronary syndrome.

## 2023-10-28 NOTE — Assessment & Plan Note (Signed)
 Continue blood pressure control, resume statin therapy.

## 2023-10-29 DIAGNOSIS — I5023 Acute on chronic systolic (congestive) heart failure: Secondary | ICD-10-CM | POA: Diagnosis not present

## 2023-10-29 LAB — BASIC METABOLIC PANEL WITH GFR
Anion gap: 10 (ref 5–15)
BUN: 30 mg/dL — ABNORMAL HIGH (ref 6–20)
CO2: 29 mmol/L (ref 22–32)
Calcium: 8.5 mg/dL — ABNORMAL LOW (ref 8.9–10.3)
Chloride: 91 mmol/L — ABNORMAL LOW (ref 98–111)
Creatinine, Ser: 1.21 mg/dL (ref 0.61–1.24)
GFR, Estimated: >60 mL/min (ref >60)
Glucose, Bld: 104 mg/dL — ABNORMAL HIGH (ref 70–99)
Potassium: 3.5 mmol/L (ref 3.5–5.1)
Sodium: 130 mmol/L — ABNORMAL LOW (ref 135–145)

## 2023-10-29 LAB — BRAIN NATRIURETIC PEPTIDE: B Natriuretic Peptide: 1083.7 pg/mL — ABNORMAL HIGH (ref 0.0–100.0)

## 2023-10-29 LAB — MAGNESIUM: Magnesium: 2.2 mg/dL (ref 1.7–2.4)

## 2023-10-29 MED ORDER — ROSUVASTATIN CALCIUM 5 MG PO TABS: 10.0000 mg | ORAL_TABLET | Freq: Every day | ORAL | Status: DC

## 2023-10-29 MED ORDER — ALPRAZOLAM 0.25 MG PO TABS
0.2500 mg | ORAL_TABLET | Freq: Once | ORAL | Status: AC
  Administered 2023-10-29: 0.25 mg via ORAL
  Filled 2023-10-29: qty 1

## 2023-10-29 MED ORDER — THIAMINE HCL 100 MG/ML IJ SOLN
100.0000 mg | Freq: Every day | INTRAMUSCULAR | Status: DC
  Administered 2023-10-29: 100 mg via INTRAVENOUS
  Filled 2023-10-29: qty 2

## 2023-10-29 MED ORDER — FUROSEMIDE 10 MG/ML IJ SOLN
60.0000 mg | Freq: Once | INTRAMUSCULAR | Status: AC
  Administered 2023-10-29: 60 mg via INTRAVENOUS
  Filled 2023-10-29: qty 6

## 2023-10-29 MED ORDER — THIAMINE MONONITRATE 100 MG PO TABS: 100.0000 mg | ORAL_TABLET | Freq: Every day | ORAL | Status: DC

## 2023-10-29 MED ORDER — LORAZEPAM 1 MG PO TABS: 1.0000 mg | ORAL_TABLET | ORAL | Status: DC | PRN

## 2023-10-29 MED ORDER — ADULT MULTIVITAMIN W/MINERALS CH
1.0000 | ORAL_TABLET | Freq: Every day | ORAL | Status: DC
  Administered 2023-10-29: 1 via ORAL
  Filled 2023-10-29: qty 1

## 2023-10-29 MED ORDER — FOLIC ACID 1 MG PO TABS
1.0000 mg | ORAL_TABLET | Freq: Every day | ORAL | Status: DC
  Administered 2023-10-29: 1 mg via ORAL
  Filled 2023-10-29: qty 1

## 2023-10-29 NOTE — Progress Notes (Signed)
 PROGRESS NOTE    John Davenport  FMW:[id number] DOB: 02-12-1976 DOA: 10/27/2023 PCP: Pcp, No   Brief Narrative:   48 yo male with the past medical history of heart failure, coronary artery disease, CKD, hepatitis C, paroxysmal ventricular tachycardia and polysubstance abuse who presented with altered mental status. Reported 2 to 3 weeks of worsening dyspnea on exertion, orthopnea and lower extremity edema. On the day of admission he was noted to have altered mentation, apparently he became less responsive and hit his head on the fireplace, producing a head laceration. He has been not taking his medications and he has been using heroin and cocaine .   Assessment & Plan:  Principal Problem:   Acute on chronic systolic CHF (congestive heart failure) (HCC) Active Problems:   CAD in native artery   CKD stage 3a, GFR 45-59 ml/min (HCC)   Hyperlipidemia   History of CVA (cerebrovascular accident)   History of hepatitis C virus infection   Polysubstance abuse (HCC)     Acute on chronic systolic CHF (congestive heart failure). NYHA III Echocardiogram with reduced LV systolic function 20 to 25%, mild to moderate LV cavity enlargement, possible small calcified thrombus in the LV apex, RV with moderate reduction in systolic function, mild RV dilatation, LA with moderate dilatation, moderate tricuspid valve regurgitation, Patient had IV furosemide  this morning.  Started on losartan  for afterload reduction, to consider spironolactone  and SGLT 2 inh if BP allows.  Holding b blocker due to acute decompensation and cocaine  use.     CAD s/p CABG and stenting: No chest pain High sensitive troponin elevation due to heart failure, no acute coronary syndrome.    CKD stage 3a, GFR 45-59 ml/min (HCC) Hyponatremia,   NSVT s/p ICD with extraction: No evidence of arrhythmias   Hyperlipidemia Continue rosuvastatin  10 mg   History of CVA (cerebrovascular accident) Continue blood pressure control,  resumed statin therapy.    History of hepatitis C virus infection Follow up as outpatient    Polysubstance abuse (HCC) IV drug use,  No signs of active withdrawal, continue neuro checks per unit protocol.   Disposition: home. Lives with his mother.  DVT prophylaxis: enoxaparin  (LOVENOX ) injection 40 mg Start: 10/27/23 2200     Code Status: Full Code Family Communication:  None at the bedside Status is: Inpatient Remains inpatient appropriate because: CHF exacerbation    Subjective:  Feels better in terms of shortness of breath and lower extremity edema.  He was seen by cardiologist, Dr. Okey this morning.  Patient said that he has been noncompliant with his medications for almost 3 to 4 years now.  He is also noncompliant with outpatient cardiology follow-ups.  He says that he lives at home with his mother.  Examination:  General exam: Appears calm and comfortable  Respiratory system: Clear to auscultation. Respiratory effort normal. Cardiovascular system: S1 & S2 heard, RRR. No JVD, murmurs, rubs, gallops or clicks.  1+ bilateral lower extremity pitting edema. Gastrointestinal system: Abdomen is nondistended, soft and nontender. No organomegaly or masses felt. Normal bowel sounds heard. Central nervous system: Alert and oriented. No focal neurological deficits. Extremities: Symmetric 5 x 5 power. Skin: No rashes, lesions or ulcers Psychiatry: Judgement and insight appear normal. Mood & affect appropriate.      Diet Orders (From admission, onward)     Start     Ordered   10/27/23 1429  Diet Heart Room service appropriate? Yes; Fluid consistency: Thin; Fluid restriction: 1500 mL Fluid  Diet effective now  Question Answer Comment  Room service appropriate? Yes   Fluid consistency: Thin   Fluid restriction: 1500 mL Fluid      10/27/23 1430            Objective: Vitals:   10/29/23 0447 10/29/23 0448 10/29/23 0449 10/29/23 0746  BP:  100/86  92/65  Pulse:  89 88 76 92  Resp:  18  20  Temp:  97.9 F (36.6 C)  97.7 F (36.5 C)  TempSrc:  Oral  Oral  SpO2: 93% 91% 100% 94%  Weight:  72.5 kg    Height:        Intake/Output Summary (Last 24 hours) at 10/29/2023 1043 Last data filed at 10/29/2023 0900 Gross per 24 hour  Intake 476 ml  Output 1400 ml  Net -924 ml   Filed Weights   10/27/23 1801 10/28/23 0400 10/29/23 0448  Weight: 72.3 kg 72.8 kg 72.5 kg    Scheduled Meds:  enoxaparin  (LOVENOX ) injection  40 mg Subcutaneous Q24H   furosemide   60 mg Intravenous Once   losartan   12.5 mg Oral Daily   [START ON 10/30/2023] rosuvastatin   10 mg Oral Daily   sodium chloride  flush  3 mL Intravenous Q12H   Continuous Infusions:  Nutritional status     Body mass index is 19.97 kg/m.  Data Reviewed:   CBC: Recent Labs  Lab 10/27/23 0916 10/28/23 0256  WBC 10.3 11.8*  HGB 11.7* 13.5  HCT 38.2* 43.5  MCV 79.3* 77.4*  PLT 257 252   Basic Metabolic Panel: Recent Labs  Lab 10/27/23 0916 10/27/23 1213 10/28/23 0256 10/29/23 0249  NA 129*  --  131* 130*  K 3.5  --  3.7 3.5  CL 85*  --  91* 91*  CO2 32  --  23 29  GLUCOSE 121*  --  71 104*  BUN 41*  --  38* 30*  CREATININE 1.23  --  1.14 1.21  CALCIUM  9.3  --  8.8* 8.5*  MG  --  1.8 1.8 2.2   GFR: Estimated Creatinine Clearance: 74.9 mL/min (by C-G formula based on SCr of 1.21 mg/dL). Liver Function Tests: Recent Labs  Lab 10/27/23 0916  AST 25  ALT 13  ALKPHOS 96  BILITOT 1.3*  PROT 8.2*  ALBUMIN  3.0*   Recent Labs  Lab 10/27/23 1213  LIPASE 26   No results for input(s): AMMONIA in the last 168 hours. Coagulation Profile: No results for input(s): INR, PROTIME in the last 168 hours. Cardiac Enzymes: No results for input(s): CKTOTAL, CKMB, CKMBINDEX, TROPONINI in the last 168 hours. BNP (last 3 results) No results for input(s): PROBNP in the last 8760 hours. HbA1C: No results for input(s): HGBA1C in the last 72 hours. CBG: Recent Labs   Lab 10/27/23 0953  GLUCAP 75   Lipid Profile: Recent Labs    10/28/23 1038  CHOL 74  HDL 16*  LDLCALC 50  TRIG 41  CHOLHDL 4.6   Thyroid  Function Tests: No results for input(s): TSH, T4TOTAL, FREET4, T3FREE, THYROIDAB in the last 72 hours. Anemia Panel: No results for input(s): VITAMINB12, FOLATE, FERRITIN, TIBC, IRON, RETICCTPCT in the last 72 hours. Sepsis Labs: Recent Labs  Lab 10/27/23 1842  LATICACIDVEN 1.6    Recent Results (from the past 240 hours)  Culture, blood (Routine X 2) w Reflex to ID Panel     Status: None (Preliminary result)   Collection Time: 10/27/23  4:49 PM   Specimen: BLOOD RIGHT ARM  Result Value  Ref Range Status   Specimen Description BLOOD RIGHT ARM  Final   Special Requests   Final    BOTTLES DRAWN AEROBIC AND ANAEROBIC Blood Culture results may not be optimal due to an inadequate volume of blood received in culture bottles   Culture   Final    NO GROWTH < 24 HOURS Performed at [facility], [address] [place], [place] [address]    Report Status PENDING  Incomplete         Radiology Studies: ECHOCARDIOGRAM COMPLETE Result Date: 10/28/2023    ECHOCARDIOGRAM REPORT   Patient Name:   JOHNATHA ZEIDMAN Date of Exam: 10/28/2023 Medical Rec #:  [id number]            Height:       75.0 in Accession #:    [id number]           Weight:       160.6 lb Date of Birth:  [birth year]             BSA:          1.999 m Patient Age:    48 years             BP:           85/59 mmHg Patient Gender: M                    HR:           67 bpm. Exam Location:  Inpatient Procedure: 2D Echo, Cardiac Doppler, Color Doppler and Intracardiac            Opacification Agent (Both Spectral and Color Flow Doppler were            utilized during procedure). Indications:    CHF- Acute Systolic  History:        Patient has prior history of Echocardiogram examinations. CHF.  Sonographer:    Vella Key Referring Phys: MIGNON ONEIDA BUMP IMPRESSIONS  1.  Heavily trabeculated LV so difficult to visualize but suspect small, calcifed thrombus in the LV apex. CMR may be helpful for confirmation and for potential diagnosis of LVNC. Left ventricular ejection fraction, by estimation, is 20 to 25%. The left ventricle has severely decreased function. The left ventricle has no regional wall motion abnormalities. The left ventricular internal cavity size was mildly to moderately dilated. Left ventricular diastolic parameters are consistent with Grade I diastolic dysfunction (impaired relaxation).  2. Right ventricular systolic function is moderately reduced. The right ventricular size is mildly enlarged.  3. Left atrial size was moderately dilated.  4. The mitral valve is normal in structure. Mild mitral valve regurgitation. No evidence of mitral stenosis.  5. The tricuspid valve is degenerative. Tricuspid valve regurgitation is moderate.  6. The aortic valve is normal in structure. Aortic valve regurgitation is not visualized. No aortic stenosis is present.  7. The inferior vena cava is normal in size with <50% respiratory variability, suggesting right atrial pressure of 8 mmHg. FINDINGS  Left Ventricle: Heavily trabeculated LV so difficult to visualize but suspect small, calcifed thrombus in the LV apex. CMR may be helpful for confirmation and for potential diagnosis of LVNC. Left ventricular ejection fraction, by estimation, is 20 to 25%. The left ventricle has severely decreased function. The left ventricle has no regional wall motion abnormalities. The left ventricular internal cavity size was mildly to moderately dilated. There is no left ventricular hypertrophy. Left ventricular diastolic parameters are consistent  with Grade I diastolic dysfunction (impaired relaxation). Right Ventricle: The right ventricular size is mildly enlarged. No increase in right ventricular wall thickness. Right ventricular systolic function is moderately reduced. Left Atrium: Left atrial size  was moderately dilated. Right Atrium: Right atrial size was normal in size. Pericardium: There is no evidence of pericardial effusion. Mitral Valve: The mitral valve is normal in structure. Mild mitral valve regurgitation. No evidence of mitral valve stenosis. Tricuspid Valve: The tricuspid valve is degenerative in appearance. Tricuspid valve regurgitation is moderate . No evidence of tricuspid stenosis. Aortic Valve: The aortic valve is normal in structure. Aortic valve regurgitation is not visualized. No aortic stenosis is present. Pulmonic Valve: The pulmonic valve was normal in structure. Pulmonic valve regurgitation is not visualized. No evidence of pulmonic stenosis. Aorta: The aortic root is normal in size and structure. Venous: The inferior vena cava is normal in size with less than 50% respiratory variability, suggesting right atrial pressure of 8 mmHg. IAS/Shunts: No atrial level shunt detected by color flow Doppler.  LEFT VENTRICLE PLAX 2D LVIDd:         6.30 cm      Diastology LVIDs:         6.20 cm      LV e' medial:    6.57 cm/s LV PW:         1.00 cm      LV E/e' medial:  10.6 LV IVS:        1.00 cm      LV e' lateral:   11.90 cm/s LVOT diam:     2.00 cm      LV E/e' lateral: 5.9 LV SV:         30 LV SV Index:   15 LVOT Area:     3.14 cm  LV Volumes (MOD) LV vol d, MOD A2C: 277.0 ml LV vol d, MOD A4C: 276.0 ml LV vol s, MOD A2C: 194.0 ml LV vol s, MOD A4C: 204.0 ml LV SV MOD A2C:     83.0 ml LV SV MOD A4C:     276.0 ml LV SV MOD BP:      70.9 ml RIGHT VENTRICLE RV Basal diam:  4.10 cm RV S prime:     8.92 cm/s TAPSE (M-mode): 1.5 cm LEFT ATRIUM             Index        RIGHT ATRIUM           Index LA diam:        4.70 cm 2.35 cm/m   RA Area:     19.00 cm LA Vol (A2C):   84.5 ml 42.27 ml/m  RA Volume:   57.80 ml  28.91 ml/m LA Vol (A4C):   99.6 ml 49.82 ml/m LA Biplane Vol: 92.0 ml 46.02 ml/m  AORTIC VALVE LVOT Vmax:   63.10 cm/s LVOT Vmean:  47.100 cm/s LVOT VTI:    0.096 m  AORTA Ao Root diam:  3.20 cm Ao Asc diam:  2.90 cm MITRAL VALVE               TRICUSPID VALVE MV Area (PHT): 5.97 cm    TV Peak grad:   30.5 mmHg MV Decel Time: 127 msec    TV Vmax:        2.76 m/s MV E velocity: 69.70 cm/s MV A velocity: 35.60 cm/s  SHUNTS MV E/A ratio:  1.96        Systemic VTI:  0.10 m                            Systemic Diam: 2.00 cm Morene Brownie Electronically signed by Morene Brownie Signature Date/Time: 10/28/2023/12:47:56 PM    Final        LOS: 2 days   Time spent= 43 mins    Deliliah Room, MD [facility]  If 7PM-7AM, please contact night-coverage  10/29/2023, 10:43 AM

## 2023-10-29 NOTE — Plan of Care (Signed)
  Problem: Clinical Measurements: Goal: Ability to maintain clinical measurements within normal limits will improve Outcome: Progressing   Problem: Education: Goal: Knowledge of General Education information will improve Description: Including pain rating scale, medication(s)/side effects and non-pharmacologic comfort measures Outcome: Progressing   Problem: Clinical Measurements: Goal: Will remain free from infection Outcome: Progressing   Problem: Clinical Measurements: Goal: Diagnostic test results will improve Outcome: Progressing   Problem: Clinical Measurements: Goal: Respiratory complications will improve Outcome: Progressing   Problem: Clinical Measurements: Goal: Cardiovascular complication will be avoided Outcome: Progressing

## 2023-10-29 NOTE — TOC CM/SW Note (Signed)
 Transition of Care [facility]) - Inpatient Brief Assessment   Patient Details  Name: John Davenport MRN: [id number] Date of Birth: 1975/12/22  Transition of Care [facility]) CM/SW Contact:    Waddell Barnie Rama, RN Phone Number: 10/29/2023, 11:39 AM   Clinical Narrative: From home with mom, has no  PCP and has  insurance on file, states has no HH services in place at this time or DME at home.  States family member  (mom) will transport them home at [employer] and family is support system, states gets medications from [facility] in [place].  Pta self ambulatory.   Follow up apt is on AVS.     Transition of Care Asessment: Insurance and Status: Insurance coverage has been reviewed Patient has primary care physician: Yes Home environment has been reviewed: home wiht Mom Prior level of function:: indep Prior/Current Home Services: No current home services Social Drivers of Health Review: SDOH reviewed no interventions necessary Readmission risk has been reviewed: Yes Transition of care needs: no transition of care needs at this time

## 2023-10-29 NOTE — Plan of Care (Signed)
  Problem: Education: Goal: Knowledge of General Education information will improve Description: Including pain rating scale, medication(s)/side effects and non-pharmacologic comfort measures Outcome: Progressing   Problem: Clinical Measurements: Goal: Will remain free from infection Outcome: Progressing Goal: Respiratory complications will improve Outcome: Progressing   Problem: Activity: Goal: Risk for activity intolerance will decrease Outcome: Progressing   Problem: Pain Managment: Goal: General experience of comfort will improve and/or be controlled Outcome: Progressing

## 2023-10-29 NOTE — Progress Notes (Signed)
 REDS Clip  READING (normal 20-35%) = 39%  CHEST RULER (in) =28 Clip Station =  C  Reds reading was done on patient x 2 , both readings showed 39 %. Patient tolerated well. Dr. Okey notified.    Stephane Haddock, BSN, Scientist, clinical (histocompatibility and immunogenetics) Only

## 2023-10-29 NOTE — Progress Notes (Signed)
 Rounding Note   Patient Name: John Davenport Date of Encounter: 10/29/2023  [facility] Cardiologist: Last seen 2021   Subjective  Breahing is OK   NO CP  Scheduled Meds:  enoxaparin  (LOVENOX ) injection  40 mg Subcutaneous Q24H   losartan   12.5 mg Oral Daily   rosuvastatin   20 mg Oral Daily   sodium chloride  flush  3 mL Intravenous Q12H   Continuous Infusions:   PRN Meds: acetaminophen , ondansetron  (ZOFRAN ) IV, sodium chloride  flush   Vital Signs  Vitals:   10/29/23 0030 10/29/23 0447 10/29/23 0448 10/29/23 0449  BP: 97/65  100/86   Pulse: 87 89 88 76  Resp: 18  18   Temp: 97.8 F (36.6 C)  97.9 F (36.6 C)   TempSrc: Oral  Oral   SpO2: 99% 93% 91% 100%  Weight:   72.5 kg   Height:        Intake/Output Summary (Last 24 hours) at 10/29/2023 0802 Last data filed at 10/29/2023 0400 Gross per 24 hour  Intake 716 ml  Output 1900 ml  Net -1184 ml   I/O  INcomplete      10/29/2023    4:48 AM 10/28/2023    4:00 AM 10/27/2023    6:01 PM  Last 3 Weights  Weight (lbs) 159 lb 12.8 oz 160 lb 9.6 oz 159 lb 6.3 oz  Weight (kg) 72.485 kg 72.848 kg 72.3 kg      Telemetry SR with PVCs   - Personally Reviewed  ECG  No new   - Personally Reviewed  Physical Exam  GEN: Thin 48 yo in no acute distress.   Neck: JVP is increased  Cardiac: RRR,  +S3   Respiratory  Relatively clear  GI: Soft, nontender, non-distended  MS: Tr LE  edema; Skin poppng sites    Labs High Sensitivity Troponin:   Recent Labs  Lab 10/27/23 0916 10/27/23 1213  TROPONINIHS 19* 20*     Chemistry Recent Labs  Lab 10/27/23 0916 10/27/23 1213 10/28/23 0256 10/29/23 0249  NA 129*  --  131* 130*  K 3.5  --  3.7 3.5  CL 85*  --  91* 91*  CO2 32  --  23 29  GLUCOSE 121*  --  71 104*  BUN 41*  --  38* 30*  CREATININE 1.23  --  1.14 1.21  CALCIUM  9.3  --  8.8* 8.5*  MG  --  1.8 1.8 2.2  PROT 8.2*  --   --   --   ALBUMIN  3.0*  --   --   --   AST 25  --   --   --   ALT  13  --   --   --   ALKPHOS 96  --   --   --   BILITOT 1.3*  --   --   --   GFRNONAA >60  --  >60 >60  ANIONGAP 12  --  17* 10    Lipids  Recent Labs  Lab 10/28/23 1038  CHOL 74  TRIG 41  HDL 16*  LDLCALC 50  CHOLHDL 4.6    Hematology Recent Labs  Lab 10/27/23 0916 10/28/23 0256  WBC 10.3 11.8*  RBC 4.82 5.62  HGB 11.7* 13.5  HCT 38.2* 43.5  MCV 79.3* 77.4*  MCH 24.3* 24.0*  MCHC 30.6 31.0  RDW 16.8* 17.0*  PLT 257 252   Thyroid  No results for input(s): TSH, FREET4 in the last 168 hours.  BNP Recent Labs  Lab 10/27/23 0916  BNP >4,500.0*    DDimer No results for input(s): DDIMER in the last 168 hours.   Radiology  ECHOCARDIOGRAM COMPLETE Result Date: 10/28/2023    ECHOCARDIOGRAM REPORT   Patient Name:   John Davenport Date of Exam: 10/28/2023 Medical Rec #:  [id number]            Height:       75.0 in Accession #:    [id number]           Weight:       160.6 lb Date of Birth:  09/02/1975             BSA:          1.999 m Patient Age:    48 years             BP:           85/59 mmHg Patient Gender: M                    HR:           67 bpm. Exam Location:  Inpatient Procedure: 2D Echo, Cardiac Doppler, Color Doppler and Intracardiac            Opacification Agent (Both Spectral and Color Flow Doppler were            utilized during procedure). Indications:    CHF- Acute Systolic  History:        Patient has prior history of Echocardiogram examinations. CHF.  Sonographer:    Vella Key Referring Phys: MIGNON ONEIDA BUMP IMPRESSIONS  1. Heavily trabeculated LV so difficult to visualize but suspect small, calcifed thrombus in the LV apex. CMR may be helpful for confirmation and for potential diagnosis of LVNC. Left ventricular ejection fraction, by estimation, is 20 to 25%. The left ventricle has severely decreased function. The left ventricle has no regional wall motion abnormalities. The left ventricular internal cavity size was mildly to moderately dilated. Left ventricular  diastolic parameters are consistent with Grade I diastolic dysfunction (impaired relaxation).  2. Right ventricular systolic function is moderately reduced. The right ventricular size is mildly enlarged.  3. Left atrial size was moderately dilated.  4. The mitral valve is normal in structure. Mild mitral valve regurgitation. No evidence of mitral stenosis.  5. The tricuspid valve is degenerative. Tricuspid valve regurgitation is moderate.  6. The aortic valve is normal in structure. Aortic valve regurgitation is not visualized. No aortic stenosis is present.  7. The inferior vena cava is normal in size with <50% respiratory variability, suggesting right atrial pressure of 8 mmHg. FINDINGS  Left Ventricle: Heavily trabeculated LV so difficult to visualize but suspect small, calcifed thrombus in the LV apex. CMR may be helpful for confirmation and for potential diagnosis of LVNC. Left ventricular ejection fraction, by estimation, is 20 to 25%. The left ventricle has severely decreased function. The left ventricle has no regional wall motion abnormalities. The left ventricular internal cavity size was mildly to moderately dilated. There is no left ventricular hypertrophy. Left ventricular diastolic parameters are consistent with Grade I diastolic dysfunction (impaired relaxation). Right Ventricle: The right ventricular size is mildly enlarged. No increase in right ventricular wall thickness. Right ventricular systolic function is moderately reduced. Left Atrium: Left atrial size was moderately dilated. Right Atrium: Right atrial size was normal in size. Pericardium: There is no evidence of pericardial effusion. Mitral Valve: The mitral valve is normal  in structure. Mild mitral valve regurgitation. No evidence of mitral valve stenosis. Tricuspid Valve: The tricuspid valve is degenerative in appearance. Tricuspid valve regurgitation is moderate . No evidence of tricuspid stenosis. Aortic Valve: The aortic valve is normal  in structure. Aortic valve regurgitation is not visualized. No aortic stenosis is present. Pulmonic Valve: The pulmonic valve was normal in structure. Pulmonic valve regurgitation is not visualized. No evidence of pulmonic stenosis. Aorta: The aortic root is normal in size and structure. Venous: The inferior vena cava is normal in size with less than 50% respiratory variability, suggesting right atrial pressure of 8 mmHg. IAS/Shunts: No atrial level shunt detected by color flow Doppler.  LEFT VENTRICLE PLAX 2D LVIDd:         6.30 cm      Diastology LVIDs:         6.20 cm      LV e' medial:    6.57 cm/s LV PW:         1.00 cm      LV E/e' medial:  10.6 LV IVS:        1.00 cm      LV e' lateral:   11.90 cm/s LVOT diam:     2.00 cm      LV E/e' lateral: 5.9 LV SV:         30 LV SV Index:   15 LVOT Area:     3.14 cm  LV Volumes (MOD) LV vol d, MOD A2C: 277.0 ml LV vol d, MOD A4C: 276.0 ml LV vol s, MOD A2C: 194.0 ml LV vol s, MOD A4C: 204.0 ml LV SV MOD A2C:     83.0 ml LV SV MOD A4C:     276.0 ml LV SV MOD BP:      70.9 ml RIGHT VENTRICLE RV Basal diam:  4.10 cm RV S prime:     8.92 cm/s TAPSE (M-mode): 1.5 cm LEFT ATRIUM             Index        RIGHT ATRIUM           Index LA diam:        4.70 cm 2.35 cm/m   RA Area:     19.00 cm LA Vol (A2C):   84.5 ml 42.27 ml/m  RA Volume:   57.80 ml  28.91 ml/m LA Vol (A4C):   99.6 ml 49.82 ml/m LA Biplane Vol: 92.0 ml 46.02 ml/m  AORTIC VALVE LVOT Vmax:   63.10 cm/s LVOT Vmean:  47.100 cm/s LVOT VTI:    0.096 m  AORTA Ao Root diam: 3.20 cm Ao Asc diam:  2.90 cm MITRAL VALVE               TRICUSPID VALVE MV Area (PHT): 5.97 cm    TV Peak grad:   30.5 mmHg MV Decel Time: 127 msec    TV Vmax:        2.76 m/s MV E velocity: 69.70 cm/s MV A velocity: 35.60 cm/s  SHUNTS MV E/A ratio:  1.96        Systemic VTI:  0.10 m                            Systemic Diam: 2.00 cm Morene Brownie Electronically signed by Morene Brownie Signature Date/Time: 10/28/2023/12:47:56 PM    Final     DG Chest 2 View Result Date: 10/27/2023 CLINICAL DATA:  Chest pain. EXAM: CHEST - 2 VIEW COMPARISON:  Chest radiograph dated 03/01/2020. FINDINGS: There is cardiomegaly with vascular congestion and mild edema. No focal consolidation, pleural effusion, or pneumothorax. Median sternotomy wires and CABG vascular clips. No acute osseous pathology. IMPRESSION: Cardiomegaly with findings of CHF. Electronically Signed   By: Vanetta Chou M.D.   On: 10/27/2023 10:22   CT Head Wo Contrast Result Date: 10/27/2023 CLINICAL DATA:  Provided history: Polytrauma, blunt. EXAM: CT HEAD WITHOUT CONTRAST TECHNIQUE: Contiguous axial images were obtained from the base of the skull through the vertex without intravenous contrast. RADIATION DOSE REDUCTION: This exam was performed according to the departmental dose-optimization program which includes automated exposure control, adjustment of the mA and/or kV according to patient size and/or use of iterative reconstruction technique. COMPARISON:  Brain MRI 03/28/2020.  Head CT 03/28/2020. FINDINGS: Brain: No age-advanced or lobar predominant cerebral atrophy. 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 There is no acute intracranial hemorrhage. No demarcated cortical infarct. No extra-axial fluid collection. No evidence of an intracranial mass. No midline shift. Vascular: No hyperdense vessel. Atherosclerotic calcifications. Skull: No calvarial fracture or aggressive osseous lesion. Sinuses/Orbits: No mass or acute finding within the imaged orbits. No significant paranasal sinus disease at the imaged levels. IMPRESSION: 1.  No evidence of an acute intracranial abnormality. 2. 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 Electronically Signed   By: Rockey Childs D.O.   On: 10/27/2023 10:08    Cardiac Studies  Echo   10/28/23  1. Heavily trabeculated LV so difficult to visualize but suspect small,  calcifed thrombus in the LV apex. CMR may  be helpful for confirmation and  for potential diagnosis of LVNC. Left ventricular ejection fraction, by  estimation, is 20 to 25%. The left  ventricle has severely decreased function. The left ventricle has no  regional wall motion abnormalities. The left ventricular internal cavity  size was mildly to moderately dilated. Left ventricular diastolic  parameters are consistent with Grade I  diastolic dysfunction (impaired relaxation).   2. Right ventricular systolic function is moderately reduced. The right  ventricular size is mildly enlarged.   3. Left atrial size was moderately dilated.   4. The mitral valve is normal in structure. Mild mitral valve  regurgitation. No evidence of mitral stenosis.   5. The tricuspid valve is degenerative. Tricuspid valve regurgitation is  moderate.   6. The aortic valve is normal in structure. Aortic valve regurgitation is  not visualized. No aortic stenosis is present.   7. The inferior vena cava is normal in size with <50% respiratory  variability, suggesting right atrial pressure of 8 mmHg.   Echocardiogram 03/29/20   1. Left ventricular ejection fraction, by estimation, is <20%. The left  ventricle has severely decreased function. The left ventricle demonstrates  global hypokinesis. The left ventricular internal cavity size was severely  dilated. Left ventricular  diastolic parameters are consistent with Grade II diastolic dysfunction  (pseudonormalization).   2. Right ventricular systolic function is normal. The right ventricular  size is normal. There is mildly elevated pulmonary artery systolic  pressure. The estimated right ventricular systolic pressure is 38.0 mmHg.   3. Left atrial size was moderately dilated.   4. The mitral valve is normal in structure. Mild mitral valve  regurgitation. No evidence of mitral stenosis.   5. The tricuspid valve is myxomatous. Tricuspid valve regurgitation is  moderate.   6. The aortic valve is normal in  structure. Aortic valve regurgitation is  not visualized. No aortic stenosis is present.   7. The inferior vena cava is normal in size with greater than 50%  respiratory variability, suggesting right atrial pressure of 3 mmHg.   Comparison(s): No current evidence of mural thrombus.    RHC/LHC 09/06/15 LIMA . Sequential LIMA-diagonal 1 and diagonal 2 is widely patent. This graft retrograde fills the LAD Ost LAD to Prox LAD lesion, 100% stenosed. The lesion was previously treated with a bare metal stent. LM lesion, 50% stenosed. Mid Cx lesion, 99% stenosed.   1. Severe single vessel CAD with total occlusion of the proximal LAD within the previously implanted stent 2. Mild-moderate distal left main disease extending into the proximal circumflex 3. Widely patent dominant RCA 4. Widely patent sequential LIMA-diagonal 1 and 2 also filling the LAD 5. Subtotal occlusion of the distal circumflex into a tiny OM, possible culprit for his NSTEMI 6. Known severe LV dysfunction   Patient Profile   John Davenport is a 48 y.o. 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   Assessment & Plan   1  HFrEF  pt with known severe LV dysfunction/ICM   Noncompliant.  Lost to follow up  Presented with worsening SOB and edema    HE is much more comfortable with diuresis  Able to sleep Echo yesterday  I have reviewed.   LVEF remains severely depressed   The are deep  trabeculations in LV susp for noncompaction.   I am not convinced there is a small calcified thrombus   With noncompliance he is not a candidate for anticoagulation  HE still appears to have increased volume  Would give lasix  IV once     Will get ReDs vest    I would continue IV lasix  this am   Follow     Tolerating low dose losartan  12.5 HOld on spironolactione No b blockers given cocaine  use  Goal would be to get him back to clinic   2  CAD Remote CABG and stents in past    No symtpoms of angina  3   NSVT s/p ICD with extraction  On amiodarone  in past   Not now    No sustained arrhythmias   Follow    4  LIpids  LDL 50   HDL 16  Trig 41  PT had not been taking any meds at home   Previoulsly Rx 20 mg   I would cut to 10 mg given CAD hx     5  Hx CVA  2018  Felt embolic  Had been on Xarelto    Not now   With noncomplance would not resume.   Ambulate some and follow    For questions or updates, please contact [facility] Please consult [email or web address] for contact info under     Signed, Vina Gull, MD  10/29/2023, 8:02 AM

## 2023-10-29 NOTE — Progress Notes (Signed)
 Heart Failure Navigator Progress Note  Assessed for Heart & Vascular TOC clinic readiness.  Patient does not meet criteria due to has a Hospital follow up on 10/.10/2023, No HF TOC per Advanced Heart Failure Team.   Navigator will sign off at this time.   Stephane Haddock, BSN, Scientist, clinical (histocompatibility and immunogenetics) Only

## 2023-10-29 NOTE — Progress Notes (Signed)
 Patient leaving AMA.  MD notified. RN advised patient to stay but patient states that he wants to go to a clinic in [place].

## 2023-10-30 NOTE — Discharge Summary (Signed)
  Physician Discharge Summary   Patient: John Davenport MRN: [id number] DOB: 10-Nov-1975  Admit date:     10/27/2023  Discharge date: 10/29/23  Discharge Physician: Deliliah Room   Patient left against medical advice   Hospital Course:  48 yo male with the past medical history of heart failure, coronary artery disease, CKD, hepatitis C, paroxysmal ventricular tachycardia and polysubstance abuse who presented with altered mental status. Reported 2 to 3 weeks of worsening dyspnea on exertion, orthopnea and lower extremity edema. On the day of admission, he was noted to have altered mentation, apparently he became less responsive and hit his head on the fireplace, producing a head laceration. He has been not taking his medications and he has been using heroin and cocaine . He was being managed for acute on chronic systolic heart failure and cardiology was consulted. His cardiac medications were being adjusted while keeping a close eye on his blood work. Patient has been non-compliant with his medications and follow ups for the past 3-4 years He decided to leave the hospital against medical advice. He was informed about potential risks of leaving the hospital including cardiopulmonary arrest and death. Patient understood the risks and signed AMA.   Deliliah Room, MD [facility] 10/30/2023

## 2023-11-01 LAB — CULTURE, BLOOD (ROUTINE X 2): Culture: NO GROWTH

## 2023-11-03 ENCOUNTER — Encounter (HOSPITAL_COMMUNITY): Payer: Self-pay

## 2023-11-03 ENCOUNTER — Other Ambulatory Visit: Payer: Self-pay

## 2023-11-03 ENCOUNTER — Emergency Department (HOSPITAL_COMMUNITY)
Admission: EM | Admit: 2023-11-03 | Discharge: 2023-11-04 | Disposition: A | Discharge status: Home / Self Care | Attending: Emergency Medicine | Admitting: Emergency Medicine

## 2023-11-03 ENCOUNTER — Emergency Department (HOSPITAL_COMMUNITY)

## 2023-11-03 DIAGNOSIS — R0602 Shortness of breath: Secondary | ICD-10-CM | POA: Insufficient documentation

## 2023-11-03 DIAGNOSIS — Z7901 Long term (current) use of anticoagulants: Secondary | ICD-10-CM | POA: Diagnosis not present

## 2023-11-03 DIAGNOSIS — I509 Heart failure, unspecified: Secondary | ICD-10-CM | POA: Diagnosis not present

## 2023-11-03 DIAGNOSIS — R079 Chest pain, unspecified: Secondary | ICD-10-CM | POA: Insufficient documentation

## 2023-11-03 DIAGNOSIS — M7989 Other specified soft tissue disorders: Secondary | ICD-10-CM | POA: Diagnosis not present

## 2023-11-03 DIAGNOSIS — I2510 Atherosclerotic heart disease of native coronary artery without angina pectoris: Secondary | ICD-10-CM | POA: Insufficient documentation

## 2023-11-03 LAB — I-STAT VENOUS BLOOD GAS, ED
Acid-Base Excess: 2 mmol/L (ref 0.0–2.0)
Bicarbonate: 29.3 mmol/L — ABNORMAL HIGH (ref 20.0–28.0)
Calcium, Ion: 1.14 mmol/L — ABNORMAL LOW (ref 1.15–1.40)
HCT: 48 % (ref 39.0–52.0)
Hemoglobin: 16.3 g/dL (ref 13.0–17.0)
O2 Saturation: 24 %
Potassium: 4.8 mmol/L (ref 3.5–5.1)
Sodium: 133 mmol/L — ABNORMAL LOW (ref 135–145)
TCO2: 31 mmol/L (ref 22–32)
pCO2, Ven: 55.5 mmHg (ref 44–60)
pH, Ven: 7.33 (ref 7.25–7.43)
pO2, Ven: 19 mmHg — CL (ref 32–45)

## 2023-11-03 LAB — TROPONIN I (HIGH SENSITIVITY)
Troponin I (High Sensitivity): 18 ng/L — ABNORMAL HIGH (ref <18)
Troponin I (High Sensitivity): 19 ng/L — ABNORMAL HIGH (ref <18)

## 2023-11-03 LAB — CBC
HCT: 43.2 % (ref 39.0–52.0)
Hemoglobin: 13 g/dL (ref 13.0–17.0)
MCH: 24.1 pg — ABNORMAL LOW (ref 26.0–34.0)
MCHC: 30.1 g/dL (ref 30.0–36.0)
MCV: 80 fL (ref 80.0–100.0)
Platelets: 276 K/uL (ref 150–400)
RBC: 5.4 MIL/uL (ref 4.22–5.81)
RDW: 17.6 % — ABNORMAL HIGH (ref 11.5–15.5)
WBC: 10.2 K/uL (ref 4.0–10.5)
nRBC: 0 % (ref 0.0–0.2)

## 2023-11-03 LAB — BASIC METABOLIC PANEL WITH GFR
Anion gap: 17 — ABNORMAL HIGH (ref 5–15)
BUN: 47 mg/dL — ABNORMAL HIGH (ref 6–20)
CO2: 24 mmol/L (ref 22–32)
Calcium: 10 mg/dL (ref 8.9–10.3)
Chloride: 88 mmol/L — ABNORMAL LOW (ref 98–111)
Creatinine, Ser: 1.35 mg/dL — ABNORMAL HIGH (ref 0.61–1.24)
GFR, Estimated: >60 mL/min (ref >60)
Glucose, Bld: 110 mg/dL — ABNORMAL HIGH (ref 70–99)
Potassium: 4.6 mmol/L (ref 3.5–5.1)
Sodium: 129 mmol/L — ABNORMAL LOW (ref 135–145)

## 2023-11-03 LAB — BRAIN NATRIURETIC PEPTIDE: B Natriuretic Peptide: >4500 pg/mL — ABNORMAL HIGH (ref 0.0–100.0)

## 2023-11-03 MED ORDER — SODIUM CHLORIDE 0.9 % IV BOLUS
250.0000 mL | Freq: Once | INTRAVENOUS | Status: AC
  Administered 2023-11-03: 250 mL via INTRAVENOUS

## 2023-11-03 MED ORDER — LORAZEPAM 1 MG PO TABS
1.0000 mg | ORAL_TABLET | Freq: Once | ORAL | Status: AC
  Administered 2023-11-03: 1 mg via ORAL
  Filled 2023-11-03: qty 1

## 2023-11-03 NOTE — ED Notes (Signed)
Unable to get IV access.

## 2023-11-03 NOTE — Progress Notes (Signed)
 [facility] consult received for lab draw/piv placement. At this time there are no order for infusions, tests, or procedures that substantiate need for piv placement at this time. Notified nurse to consult phlebotomy for lab draw. Powell Bowler, RN VAST

## 2023-11-03 NOTE — ED Provider Notes (Signed)
 Pt was admitted to the hospital AMA on 7/31 after an admission for CHF and CP.  His labs were delayed as he was a difficult stick and labs hemolyzed.  Pt signed out by Dr. Lenor pending labs.  CBC nl; bmp nl other than na low at 129, bun 47 and cr 1.35 (bun 30 and cr 1.21 on 7/31); trop 18; bnp elevated at >4500 (stable); VBG nl  CXR reviewed by me.  I agree with the radiologist.  CXR:  Significant interval improvement of pulmonary vascular  congestion.  2. Unchanged cardiomegaly.   Pt's 2nd trop is stable.  Pt refused to walk with O2 sat, but O2 sat 93% on RA.  Pt is encouraged to take his meds.  He is to return if worse.  He has a cardiology appointment next week.  He is to keep that appt.   Dean Clarity, MD 11/03/23 [phone]

## 2023-11-03 NOTE — Discharge Instructions (Addendum)
 Take your medications as directed.  Avoid cocaine .    Keep your cardiology appointment as scheduled.

## 2023-11-03 NOTE — ED Triage Notes (Signed)
 Pt states he has had shortness of breath worsening over past month, was discharged from hospital recently for same. Has had mid-sternal chest pain, nausea and vomiting.

## 2023-11-03 NOTE — ED Provider Triage Note (Signed)
 Emergency Medicine Provider Triage Evaluation Note  SAMIN MILKE , a 48 y.o. male  was evaluated in triage.  Pt complains of SOB, chest tightness started this morning.  Review of Systems  Positive: CP, SOB, BLE Negative: Fever  Physical Exam  BP 109/75 (BP Location: Left Arm)   Pulse 93   Temp (!) 93.5 F (34.2 C) (Oral)   Resp (!) 22   Ht 6' 3 (1.905 m)   Wt 73 kg   SpO2 94%   BMI 20.12 kg/m  Gen:   Awake, no distress   Resp:  Normal effort  MSK:   Moves extremitis without difficulty  Other:    Medical Decision Making  Medically screening exam initiated at 1:07 PM.  Appropriate orders placed.  [name] was informed that the remainder of the evaluation will be completed by another provider, this initial triage assessment does not replace that evaluation, and the importance of remaining in the ED until their evaluation is complete.  Recent admit for CHF, states CHF is back and that he is really SOB. Reports he has not been taking medicaitons.    Shermon Warren SAILOR, PA-C 11/03/23 1308

## 2023-11-03 NOTE — ED Provider Notes (Signed)
 [place] EMERGENCY DEPARTMENT AT [facility] Provider Note   CSN: [id number] Arrival date & time: 11/03/23  1233     Patient presents with: Shortness of Breath   John Davenport is a 48 y.o. male.   Patient is a 48 year old male with a history of ischemic cardiomyopathy, coronary artery disease, paroxysmal V. tach and substance abuse who complains of shortness of breath.  He was recently admitted to the hospital for congestive heart failure.  He ended up leaving AMA on July 31, he says because he needed Suboxone  and they would not prescribe him Suboxone .  He comes in today with worsening shortness of breath and chest pain.  He has also had some leg swelling.  No fevers.  No productive cough.  He does continue to use cocaine  and heroin.  He does want to start Suboxone  and try to get off of the heroin.  He last used this morning.       Prior to Admission medications   Medication Sig Start Date End Date Taking? Authorizing Provider  amiodarone  (PACERONE ) 200 MG tablet Take 1 tablet (200 mg total) by mouth daily. Patient not taking: Reported on 10/27/2023 03/19/20   Glena Harlene HERO, FNP  buprenorphine -naloxone  (SUBOXONE ) 8-2 mg SUBL SL tablet Place 1 tablet under the tongue 2 (two) times daily. Patient not taking: Reported on 10/27/2023 03/30/20   Jerrell Cleatus Ned, MD  carvedilol  (COREG ) 3.125 MG tablet Take 1 tablet (3.125 mg total) by mouth 2 (two) times daily. Patient not taking: Reported on 10/27/2023 03/19/20 03/19/21  Glena Harlene HERO, FNP  dapagliflozin  propanediol (FARXIGA ) 10 MG TABS tablet Take 1 tablet (10 mg total) by mouth daily. Patient not taking: Reported on 10/27/2023 03/19/20   Glena Harlene HERO, FNP  digoxin  (LANOXIN ) 0.125 MG tablet Take 1 tablet (0.125 mg total) by mouth daily. Patient not taking: Reported on 10/27/2023 03/19/20   Glena Harlene HERO, FNP  furosemide  (LASIX ) 40 MG tablet Take 1 tablet (40 mg total) by mouth 2 (two) times daily. With  extra 40 mg as needed for 3 pound weight gain. Patient taking differently: Take 40 mg by mouth daily as needed for edema or fluid. With extra 40 mg as needed for 3 pound weight gain. 03/19/20   Milford, Harlene HERO, FNP  rivaroxaban  (XARELTO ) 20 MG TABS tablet Take 1 tablet (20 mg total) by mouth daily with supper. Patient not taking: Reported on 10/27/2023 03/19/20   Glena Harlene HERO, FNP  rosuvastatin  (CRESTOR ) 20 MG tablet TAKE 1 TABLET (20 MG TOTAL) BY MOUTH DAILY. Patient not taking: Reported on 10/27/2023 03/02/20 03/02/21  Lenetta No D, NP  sacubitril -valsartan  (ENTRESTO ) 24-26 MG Take 1 tablet by mouth 2 (two) times daily. Patient not taking: Reported on 10/27/2023 03/19/20   Glena Harlene HERO, FNP  spironolactone  (ALDACTONE ) 25 MG tablet Take 1 tablet (25 mg total) by mouth daily. Patient not taking: Reported on 10/27/2023 03/19/20   Glena Harlene HERO, FNP    Allergies: Patient has no known allergies.    Review of Systems  Constitutional:  Positive for fatigue. Negative for chills, diaphoresis and fever.  HENT:  Negative for congestion, rhinorrhea and sneezing.   Eyes: Negative.   Respiratory:  Positive for shortness of breath. Negative for cough and chest tightness.   Cardiovascular:  Positive for chest pain and leg swelling.  Gastrointestinal:  Negative for abdominal pain, diarrhea, nausea and vomiting.  Genitourinary:  Negative for difficulty urinating, flank pain, frequency and hematuria.  Musculoskeletal:  Negative  for arthralgias and back pain.  Skin:  Negative for rash.  Neurological:  Negative for speech difficulty, weakness, numbness and headaches.  Psychiatric/Behavioral:  The patient is nervous/anxious.     Updated Vital Signs BP 114/63   Pulse (!) 118   Temp 97.8 F (36.6 C)   Resp (!) 23   Ht 6' 3 (1.905 m)   Wt 73 kg   SpO2 100%   BMI 20.12 kg/m   Physical Exam Constitutional:      Appearance: He is well-developed.  HENT:     Head: Normocephalic and  atraumatic.  Eyes:     Pupils: Pupils are equal, round, and reactive to light.  Cardiovascular:     Rate and Rhythm: Normal rate and regular rhythm.     Heart sounds: Normal heart sounds.  Pulmonary:     Effort: Pulmonary effort is normal. No respiratory distress.     Breath sounds: Normal breath sounds. No wheezing or rales.  Chest:     Chest wall: No tenderness.  Abdominal:     General: Bowel sounds are normal.     Palpations: Abdomen is soft.     Tenderness: There is no abdominal tenderness. There is no guarding or rebound.  Musculoskeletal:        General: Normal range of motion.     Cervical back: Normal range of motion and neck supple.  Lymphadenopathy:     Cervical: No cervical adenopathy.  Skin:    General: Skin is warm and dry.     Findings: No rash.  Neurological:     Mental Status: He is alert and oriented to person, place, and time.     (all labs ordered are listed, but only abnormal results are displayed) Labs Reviewed  BASIC METABOLIC PANEL WITH GFR  CBC  BRAIN NATRIURETIC PEPTIDE  I-STAT VENOUS BLOOD GAS, ED  TROPONIN I (HIGH SENSITIVITY)  TROPONIN I (HIGH SENSITIVITY)    EKG: EKG Interpretation Date/Time:  Tuesday November 03 2023 13:37:32 EDT Ventricular Rate:  91 PR Interval:  184 QRS Duration:  164 QT Interval:  412 QTC Calculation: 506 R Axis:   -17  Text Interpretation: Normal sinus rhythm Biatrial enlargement Left bundle branch block Abnormal ECG When compared with ECG of 27-Oct-2023 09:11, PREVIOUS ECG IS PRESENT similar to prior EKG Confirmed by Lenor Hollering [phone]) on 11/03/2023 2:44:39 PM  Radiology: [facility] Chest 2 View Result Date: 11/03/2023 CLINICAL DATA:  shortness of breath EXAM: CHEST - 2 VIEW COMPARISON:  10/27/2023 FINDINGS: Unchanged cardiomegaly. Postsurgical changes of CABG again seen. Significant interval improvement of pulmonary vascular congestion. Lungs are clear. IMPRESSION: 1. Significant interval improvement of pulmonary  vascular congestion. 2. Unchanged cardiomegaly. Electronically Signed   By: Aliene Lloyd M.D.   On: 11/03/2023 14:28     Procedures   Medications Ordered in the ED - No data to display                                  Medical Decision Making Amount and/or Complexity of Data Reviewed Labs: ordered. Radiology: ordered.   Patient is a 48 year old male who presents with shortness of breath.  He has normal oxygen saturations.  He is noted to be mildly tachycardic.  EKG does not show any acute ischemic changes.  Chest x-ray was reviewed by me and interpreted by the radiologist to show no pneumonia.  No overt pulmonary edema.  No pneumothorax.  He is  awaiting labs.  Care turned over to Dr. Havlin.     Final diagnoses:  None    ED Discharge Orders     None          Lenor Hollering, MD 11/03/23 1640

## 2023-11-03 NOTE — ED Notes (Signed)
Unable to obtain labsx2

## 2023-11-03 NOTE — ED Notes (Signed)
 Pt refused to do the ambulation trail. Pt states he is afraid that his heart is going to stop

## 2023-11-07 ENCOUNTER — Encounter (HOSPITAL_COMMUNITY): Payer: Self-pay

## 2023-11-07 ENCOUNTER — Other Ambulatory Visit: Payer: Self-pay

## 2023-11-07 ENCOUNTER — Emergency Department (HOSPITAL_COMMUNITY)

## 2023-11-07 ENCOUNTER — Inpatient Hospital Stay (HOSPITAL_COMMUNITY)
Admission: EM | Admit: 2023-11-07 | Discharge: 2023-11-30 | DRG: 291 | Disposition: E | Attending: Internal Medicine | Admitting: Internal Medicine

## 2023-11-07 DIAGNOSIS — I472 Ventricular tachycardia, unspecified: Secondary | ICD-10-CM | POA: Diagnosis present

## 2023-11-07 DIAGNOSIS — Z955 Presence of coronary angioplasty implant and graft: Secondary | ICD-10-CM

## 2023-11-07 DIAGNOSIS — J9601 Acute respiratory failure with hypoxia: Secondary | ICD-10-CM | POA: Diagnosis not present

## 2023-11-07 DIAGNOSIS — F410 Panic disorder [episodic paroxysmal anxiety] without agoraphobia: Secondary | ICD-10-CM | POA: Diagnosis present

## 2023-11-07 DIAGNOSIS — F1190 Opioid use, unspecified, uncomplicated: Secondary | ICD-10-CM | POA: Diagnosis present

## 2023-11-07 DIAGNOSIS — I5043 Acute on chronic combined systolic (congestive) and diastolic (congestive) heart failure: Principal | ICD-10-CM | POA: Diagnosis present

## 2023-11-07 DIAGNOSIS — N1832 Chronic kidney disease, stage 3b: Secondary | ICD-10-CM | POA: Diagnosis present

## 2023-11-07 DIAGNOSIS — N1831 Chronic kidney disease, stage 3a: Secondary | ICD-10-CM | POA: Diagnosis present

## 2023-11-07 DIAGNOSIS — T383X6A Underdosing of insulin and oral hypoglycemic [antidiabetic] drugs, initial encounter: Secondary | ICD-10-CM | POA: Diagnosis present

## 2023-11-07 DIAGNOSIS — I5082 Biventricular heart failure: Secondary | ICD-10-CM | POA: Diagnosis present

## 2023-11-07 DIAGNOSIS — I252 Old myocardial infarction: Secondary | ICD-10-CM

## 2023-11-07 DIAGNOSIS — E785 Hyperlipidemia, unspecified: Secondary | ICD-10-CM | POA: Diagnosis present

## 2023-11-07 DIAGNOSIS — R079 Chest pain, unspecified: Principal | ICD-10-CM

## 2023-11-07 DIAGNOSIS — N179 Acute kidney failure, unspecified: Secondary | ICD-10-CM | POA: Diagnosis not present

## 2023-11-07 DIAGNOSIS — Z95810 Presence of automatic (implantable) cardiac defibrillator: Secondary | ICD-10-CM

## 2023-11-07 DIAGNOSIS — Z8249 Family history of ischemic heart disease and other diseases of the circulatory system: Secondary | ICD-10-CM

## 2023-11-07 DIAGNOSIS — Z951 Presence of aortocoronary bypass graft: Secondary | ICD-10-CM

## 2023-11-07 DIAGNOSIS — F1410 Cocaine abuse, uncomplicated: Secondary | ICD-10-CM | POA: Diagnosis present

## 2023-11-07 DIAGNOSIS — T460X6A Underdosing of cardiac-stimulant glycosides and drugs of similar action, initial encounter: Secondary | ICD-10-CM | POA: Diagnosis present

## 2023-11-07 DIAGNOSIS — Z8673 Personal history of transient ischemic attack (TIA), and cerebral infarction without residual deficits: Secondary | ICD-10-CM

## 2023-11-07 DIAGNOSIS — K219 Gastro-esophageal reflux disease without esophagitis: Secondary | ICD-10-CM | POA: Diagnosis present

## 2023-11-07 DIAGNOSIS — F17210 Nicotine dependence, cigarettes, uncomplicated: Secondary | ICD-10-CM | POA: Diagnosis present

## 2023-11-07 DIAGNOSIS — T466X6A Underdosing of antihyperlipidemic and antiarteriosclerotic drugs, initial encounter: Secondary | ICD-10-CM | POA: Diagnosis present

## 2023-11-07 DIAGNOSIS — I447 Left bundle-branch block, unspecified: Secondary | ICD-10-CM | POA: Diagnosis present

## 2023-11-07 DIAGNOSIS — T45516A Underdosing of anticoagulants, initial encounter: Secondary | ICD-10-CM | POA: Diagnosis present

## 2023-11-07 DIAGNOSIS — Z560 Unemployment, unspecified: Secondary | ICD-10-CM

## 2023-11-07 DIAGNOSIS — T501X6A Underdosing of loop [high-ceiling] diuretics, initial encounter: Secondary | ICD-10-CM | POA: Diagnosis present

## 2023-11-07 DIAGNOSIS — I509 Heart failure, unspecified: Secondary | ICD-10-CM

## 2023-11-07 DIAGNOSIS — E871 Hypo-osmolality and hyponatremia: Secondary | ICD-10-CM | POA: Diagnosis present

## 2023-11-07 DIAGNOSIS — R570 Cardiogenic shock: Secondary | ICD-10-CM | POA: Diagnosis present

## 2023-11-07 DIAGNOSIS — I255 Ischemic cardiomyopathy: Secondary | ICD-10-CM | POA: Diagnosis present

## 2023-11-07 DIAGNOSIS — G4733 Obstructive sleep apnea (adult) (pediatric): Secondary | ICD-10-CM | POA: Diagnosis present

## 2023-11-07 DIAGNOSIS — Z79899 Other long term (current) drug therapy: Secondary | ICD-10-CM

## 2023-11-07 DIAGNOSIS — Z825 Family history of asthma and other chronic lower respiratory diseases: Secondary | ICD-10-CM

## 2023-11-07 DIAGNOSIS — J432 Centrilobular emphysema: Secondary | ICD-10-CM | POA: Diagnosis present

## 2023-11-07 DIAGNOSIS — Z7901 Long term (current) use of anticoagulants: Secondary | ICD-10-CM

## 2023-11-07 DIAGNOSIS — I5023 Acute on chronic systolic (congestive) heart failure: Secondary | ICD-10-CM | POA: Diagnosis present

## 2023-11-07 DIAGNOSIS — Z66 Do not resuscitate: Secondary | ICD-10-CM | POA: Diagnosis not present

## 2023-11-07 DIAGNOSIS — T447X6A Underdosing of beta-adrenoreceptor antagonists, initial encounter: Secondary | ICD-10-CM | POA: Diagnosis present

## 2023-11-07 DIAGNOSIS — Z85118 Personal history of other malignant neoplasm of bronchus and lung: Secondary | ICD-10-CM

## 2023-11-07 DIAGNOSIS — F411 Generalized anxiety disorder: Secondary | ICD-10-CM | POA: Diagnosis present

## 2023-11-07 DIAGNOSIS — R600 Localized edema: Secondary | ICD-10-CM

## 2023-11-07 DIAGNOSIS — I081 Rheumatic disorders of both mitral and tricuspid valves: Secondary | ICD-10-CM | POA: Diagnosis present

## 2023-11-07 DIAGNOSIS — E7800 Pure hypercholesterolemia, unspecified: Secondary | ICD-10-CM | POA: Diagnosis present

## 2023-11-07 DIAGNOSIS — I4891 Unspecified atrial fibrillation: Secondary | ICD-10-CM | POA: Diagnosis present

## 2023-11-07 DIAGNOSIS — Z8619 Personal history of other infectious and parasitic diseases: Secondary | ICD-10-CM

## 2023-11-07 DIAGNOSIS — Z91128 Patient's intentional underdosing of medication regimen for other reason: Secondary | ICD-10-CM

## 2023-11-07 DIAGNOSIS — I2510 Atherosclerotic heart disease of native coronary artery without angina pectoris: Secondary | ICD-10-CM | POA: Diagnosis present

## 2023-11-07 DIAGNOSIS — F1910 Other psychoactive substance abuse, uncomplicated: Secondary | ICD-10-CM | POA: Diagnosis present

## 2023-11-07 DIAGNOSIS — T500X6A Underdosing of mineralocorticoids and their antagonists, initial encounter: Secondary | ICD-10-CM | POA: Diagnosis present

## 2023-11-07 DIAGNOSIS — Z515 Encounter for palliative care: Secondary | ICD-10-CM

## 2023-11-07 DIAGNOSIS — G9341 Metabolic encephalopathy: Secondary | ICD-10-CM | POA: Diagnosis not present

## 2023-11-07 LAB — BASIC METABOLIC PANEL WITH GFR
Anion gap: 14 (ref 5–15)
BUN: 61 mg/dL — ABNORMAL HIGH (ref 6–20)
CO2: 22 mmol/L (ref 22–32)
Calcium: 9.3 mg/dL (ref 8.9–10.3)
Chloride: 90 mmol/L — ABNORMAL LOW (ref 98–111)
Creatinine, Ser: 1.11 mg/dL (ref 0.61–1.24)
GFR, Estimated: >60 mL/min (ref >60)
Glucose, Bld: 109 mg/dL — ABNORMAL HIGH (ref 70–99)
Potassium: 4.5 mmol/L (ref 3.5–5.1)
Sodium: 126 mmol/L — ABNORMAL LOW (ref 135–145)

## 2023-11-07 LAB — URINALYSIS, ROUTINE W REFLEX MICROSCOPIC
Bilirubin Urine: NEGATIVE
Glucose, UA: NEGATIVE mg/dL
Hgb urine dipstick: NEGATIVE
Ketones, ur: NEGATIVE mg/dL
Leukocytes,Ua: NEGATIVE
Nitrite: NEGATIVE
Protein, ur: 30 mg/dL — AB
Specific Gravity, Urine: 1.016 (ref 1.005–1.030)
pH: 5 (ref 5.0–8.0)

## 2023-11-07 LAB — RAPID URINE DRUG SCREEN, HOSP PERFORMED
Amphetamines: NOT DETECTED
Barbiturates: NOT DETECTED
Benzodiazepines: NOT DETECTED
Cocaine: POSITIVE — AB
Opiates: NOT DETECTED
Tetrahydrocannabinol: NOT DETECTED

## 2023-11-07 LAB — CBC
HCT: 36 % — ABNORMAL LOW (ref 39.0–52.0)
Hemoglobin: 11.3 g/dL — ABNORMAL LOW (ref 13.0–17.0)
MCH: 24.1 pg — ABNORMAL LOW (ref 26.0–34.0)
MCHC: 31.4 g/dL (ref 30.0–36.0)
MCV: 76.8 fL — ABNORMAL LOW (ref 80.0–100.0)
Platelets: 205 K/uL (ref 150–400)
RBC: 4.69 MIL/uL (ref 4.22–5.81)
RDW: 17.6 % — ABNORMAL HIGH (ref 11.5–15.5)
WBC: 9.7 K/uL (ref 4.0–10.5)
nRBC: 0.4 % — ABNORMAL HIGH (ref 0.0–0.2)

## 2023-11-07 LAB — TROPONIN I (HIGH SENSITIVITY)
Troponin I (High Sensitivity): 22 ng/L — ABNORMAL HIGH (ref <18)
Troponin I (High Sensitivity): 23 ng/L — ABNORMAL HIGH (ref <18)

## 2023-11-07 MED ORDER — LORAZEPAM 1 MG PO TABS
1.0000 mg | ORAL_TABLET | Freq: Once | ORAL | Status: AC
  Administered 2023-11-07: 1 mg via ORAL
  Filled 2023-11-07: qty 1

## 2023-11-07 NOTE — ED Notes (Signed)
 Phlebotomy stuck patient twice unable to collect labs RN aware

## 2023-11-07 NOTE — ED Triage Notes (Signed)
 Pt presents to ED from home C/O chest pain, SOB, BLE swelling X months. Pt states, there's gotta be a blockage, there's gotta be something wrong. Ask my mama - she's out there [in lobby]. Pt a&o X 4, speaking in complete sentences during triage.

## 2023-11-07 NOTE — ED Notes (Signed)
 PT's mother states he has been weak and has fell several times lately with the last being once yesterday

## 2023-11-07 NOTE — ED Provider Notes (Signed)
 [place] EMERGENCY DEPARTMENT AT [facility] Provider Note   CSN: [id number] Arrival date & time: 11/07/23  1704     Patient presents with: Chest Pain and Leg Swelling   John Davenport is a 48 y.o. male.    Chest Pain Patient is a 48 year old male to the ED today for concerns for chest pain, shortness of breath, lower abdominal pain that has been present for several weeks, worse within the last 2 days, noting worsening lower leg swelling as well.  Noted to have used heroin 2 days ago as well as cocaine .  Noted to have been seen 5 days ago for acute heart failure in the emergency department also noting some lower leg swelling at that time.  Discharged that time with cardiology appointment next week.  Reports that the chest pain and shortness of breath are mildly worse today.  Also reporting some bilateral lower abdominal pain today.   States he has bodyaches and chills.  As well as concern for his persistent bilateral lower extremity swelling.  Denies fever, headache, vision changes, hemoptysis, cough, congestion.  Prior to Admission medications   Medication Sig Start Date End Date Taking? Authorizing Provider  amiodarone  (PACERONE ) 200 MG tablet Take 1 tablet (200 mg total) by mouth daily. Patient not taking: Reported on 10/27/2023 03/19/20   Glena Harlene HERO, FNP  buprenorphine -naloxone  (SUBOXONE ) 8-2 mg SUBL SL tablet Place 1 tablet under the tongue 2 (two) times daily. Patient not taking: Reported on 10/27/2023 03/30/20   Jerrell Cleatus Ned, MD  carvedilol  (COREG ) 3.125 MG tablet Take 1 tablet (3.125 mg total) by mouth 2 (two) times daily. Patient not taking: Reported on 10/27/2023 03/19/20 03/19/21  Glena Harlene HERO, FNP  dapagliflozin  propanediol (FARXIGA ) 10 MG TABS tablet Take 1 tablet (10 mg total) by mouth daily. Patient not taking: Reported on 10/27/2023 03/19/20   Glena Harlene HERO, FNP  digoxin  (LANOXIN ) 0.125 MG tablet Take 1 tablet (0.125 mg total) by  mouth daily. Patient not taking: Reported on 10/27/2023 03/19/20   Glena Harlene HERO, FNP  furosemide  (LASIX ) 40 MG tablet Take 1 tablet (40 mg total) by mouth 2 (two) times daily. With extra 40 mg as needed for 3 pound weight gain. Patient taking differently: Take 40 mg by mouth daily as needed for edema or fluid. With extra 40 mg as needed for 3 pound weight gain. 03/19/20   Milford, Harlene HERO, FNP  rivaroxaban  (XARELTO ) 20 MG TABS tablet Take 1 tablet (20 mg total) by mouth daily with supper. Patient not taking: Reported on 10/27/2023 03/19/20   Glena Harlene HERO, FNP  rosuvastatin  (CRESTOR ) 20 MG tablet TAKE 1 TABLET (20 MG TOTAL) BY MOUTH DAILY. Patient not taking: Reported on 10/27/2023 03/02/20 03/02/21  Lenetta No D, NP  sacubitril -valsartan  (ENTRESTO ) 24-26 MG Take 1 tablet by mouth 2 (two) times daily. Patient not taking: Reported on 10/27/2023 03/19/20   Glena Harlene HERO, FNP  spironolactone  (ALDACTONE ) 25 MG tablet Take 1 tablet (25 mg total) by mouth daily. Patient not taking: Reported on 10/27/2023 03/19/20   Glena Harlene HERO, FNP    Allergies: Patient has no known allergies.    Review of Systems  Cardiovascular:  Positive for chest pain and leg swelling.  All other systems reviewed and are negative.   Updated Vital Signs BP 99/87   Pulse 90   Temp 98 F (36.7 C)   Resp 19   Ht 6' 3 (1.905 m)   Wt 72.6 kg   SpO2 100%  BMI 20.00 kg/m   Physical Exam Vitals and nursing note reviewed.  Constitutional:      General: He is not in acute distress.    Appearance: Normal appearance. He is not ill-appearing or diaphoretic.  HENT:     Head: Normocephalic and atraumatic.  Eyes:     General: No scleral icterus.       Right eye: No discharge.        Left eye: No discharge.     Extraocular Movements: Extraocular movements intact.     Conjunctiva/sclera: Conjunctivae normal.  Cardiovascular:     Rate and Rhythm: Normal rate and regular rhythm.     Pulses: Normal pulses.      Heart sounds: Normal heart sounds. No murmur heard.    No friction rub. No gallop.  Pulmonary:     Effort: Pulmonary effort is normal. No respiratory distress.     Breath sounds: No stridor. Examination of the right-lower field reveals rales. Examination of the left-lower field reveals rales. Rales present. No wheezing or rhonchi.  Chest:     Chest wall: No tenderness.  Abdominal:     General: Abdomen is flat. There is no distension.     Palpations: Abdomen is soft.     Tenderness: There is no abdominal tenderness. There is no right CVA tenderness, left CVA tenderness, guarding or rebound.  Musculoskeletal:        General: No swelling, deformity or signs of injury.     Cervical back: Normal range of motion. No rigidity.     Right lower leg: Edema present.     Left lower leg: Edema present.  Skin:    General: Skin is warm and dry.     Findings: Bruising, erythema and lesion (Notably has redness to the bilateral lower extremities with picture noted in chart.) present.  Neurological:     General: No focal deficit present.     Mental Status: He is alert and oriented to person, place, and time. Mental status is at baseline.     Sensory: No sensory deficit.     Motor: No weakness.     Gait: Gait normal.  Psychiatric:        Mood and Affect: Mood normal.        (all labs ordered are listed, but only abnormal results are displayed) Labs Reviewed  CBC - Abnormal; Notable for the following components:      Result Value   Hemoglobin 11.3 (*)    HCT 36.0 (*)    MCV 76.8 (*)    MCH 24.1 (*)    RDW 17.6 (*)    nRBC 0.4 (*)    All other components within normal limits  RAPID URINE DRUG SCREEN, HOSP PERFORMED - Abnormal; Notable for the following components:   Cocaine  POSITIVE (*)    All other components within normal limits  URINALYSIS, ROUTINE W REFLEX MICROSCOPIC - Abnormal; Notable for the following components:   Protein, ur 30 (*)    Bacteria, UA RARE (*)    All other  components within normal limits  TROPONIN I (HIGH SENSITIVITY) - Abnormal; Notable for the following components:   Troponin I (High Sensitivity) 22 (*)    All other components within normal limits  CULTURE, BLOOD (ROUTINE X 2)  CULTURE, BLOOD (ROUTINE X 2)  BASIC METABOLIC PANEL WITH GFR  BRAIN NATRIURETIC PEPTIDE  TROPONIN I (HIGH SENSITIVITY)    EKG: None  Radiology: DG Chest 2 View Result Date: 11/07/2023 CLINICAL DATA:  Chest  pain, sob; hx of lung cancer, CHF EXAM: CHEST - 2 VIEW COMPARISON:  Chest x-ray 11/03/2023 FINDINGS: The heart and mediastinal contours are unchanged. No focal consolidation. Mild pulmonary edema. No pleural effusion. No pneumothorax. No acute osseous abnormality.  Sternotomy wires are intact. IMPRESSION: Mild pulmonary edema. Electronically Signed   By: Morgane  Naveau M.D.   On: 11/07/2023 18:11   Procedures   Medications Ordered in the ED  LORazepam  (ATIVAN ) tablet 1 mg (1 mg Oral Given 11/07/23 2326)    Medical Decision Making Amount and/or Complexity of Data Reviewed Labs: ordered. Radiology: ordered.  Risk Prescription drug management.   This patient is a 48 year old male who presents to the ED for concern of mild worsening chest pain and shortness of breath in the presence of bilateral lower extremity edema, heroin use and cocaine  use status persistent, recently discharged AMA from hospital the presence of chronic additional heart failure, due to him not getting his Suboxone  in hospital.  Today he notes that his last use of heroin and cocaine  use was 2 days ago.  Is also having some mild bodyaches and chills.  On physical exam, patient is in no acute distress, afebrile, alert and orient x 4, speaking in full sentences, nontachypneic, nontachycardic.  Noted to have some mild rales bilaterally, RRR, no murmur.  Bilateral lower edema presents with bruising over knees, patient noted to have this on last admission to the hospital, also having some lesions  presents that do not appear to be cellulitic at this time.  Patient is ambulatory without difficulty.  Unremarkable exam otherwise.  Patient was provided oral Ativan  due to anxiety.  Waiting on for the labs and imaging due to patient having poor veins due to chronic heroin use.  CBC notes a mildly decreased hemoglobin as well as noting mildly elevated troponin, similar to previous when admitted.  All other labs and imaging are pending.  Patient care transferred over [employer].  Differential diagnoses prior to evaluation: The emergent differential diagnosis includes, but is not limited to, ACS, AAS, Pulmonary Embolism, Tension Pneumothorax, Esophageal Rupture, Cardiac Tamponade, Pericarditis, Myocarditis, Pneumothorax, Pneumonia, Aortic Stenosis, CHF Exacerbation, GERD,  Esophageal Spasm,  Mallory-Weiss, Costochondritis, Musculoskeletal Chest Wall Pain, Anxiety / Panic Attack, endocarditis, myocarditis. This is not an exhaustive differential.   Past Medical History / Co-morbidities / Social History: CAD, chronic congestive heart failure, GERD, heroin use, cocaine  ICD, stroke, anxiety  Additional history: Chart reviewed. Pertinent results include:   Seen 4 days ago for congestive heart failure recently admitted but then left AMA on July 31.  Reporting that he had needed Suboxone  that they would not prescribe him in hospital.  Continue leg swelling at that time.  As well as continued use of cocaine  and heroin.  Lab Tests/Imaging studies: I personally interpreted labs/imaging and the pertinent results include:   CBC notes a decreased hemoglobin of 11.3 with initial troponin of 22, urine unremarkable and rapid urine drug screen positive for cocaine . Chest x-ray shows pulmonary edema I agree with the radiologist interpretation.  Pending second troponin, BMP, blood cultures, BMP with patient in the morning is declining secondary to heroin use  Cardiac monitoring: EKG obtained and interpreted  by myself and attending physician which shows: Sinus rhythm with atrial lodgment     Medications: I ordered medication including lorazepam .  I have reviewed the patients home medicines and have made adjustments as needed.  Critical Interventions: None  Social Determinants of Health: Medical history of heroin and cocaine  use, leaving  AMA recently from the hospital  Disposition: 11:54 PM Care of John Davenport transferred to [employer] and Dr. Midge at the end of my shift as the patient will require reassessment once labs/imaging have resulted. Patient presentation, ED course, and plan of care discussed with review of all pertinent labs and imaging. Please see his/her note for further details regarding further ED course and disposition. Plan at time of handoff is await labs, likely admit for heart failure exacerbation. This may be altered or completely changed at the discretion of the oncoming team pending results of further workup.   Final diagnoses:  Chest pain, unspecified type  Bilateral leg edema    ED Discharge Orders     None          Beola Terrall GORMAN DEVONNA 11/07/23 2355    Randol Simmonds, MD 11/08/23 2302

## 2023-11-08 ENCOUNTER — Encounter (HOSPITAL_COMMUNITY): Payer: Self-pay | Admitting: Family Medicine

## 2023-11-08 ENCOUNTER — Emergency Department (HOSPITAL_COMMUNITY)

## 2023-11-08 DIAGNOSIS — I255 Ischemic cardiomyopathy: Secondary | ICD-10-CM | POA: Diagnosis present

## 2023-11-08 DIAGNOSIS — F17210 Nicotine dependence, cigarettes, uncomplicated: Secondary | ICD-10-CM | POA: Diagnosis present

## 2023-11-08 DIAGNOSIS — Z66 Do not resuscitate: Secondary | ICD-10-CM | POA: Diagnosis not present

## 2023-11-08 DIAGNOSIS — N1832 Chronic kidney disease, stage 3b: Secondary | ICD-10-CM | POA: Diagnosis present

## 2023-11-08 DIAGNOSIS — I5082 Biventricular heart failure: Secondary | ICD-10-CM | POA: Diagnosis present

## 2023-11-08 DIAGNOSIS — E871 Hypo-osmolality and hyponatremia: Secondary | ICD-10-CM | POA: Diagnosis present

## 2023-11-08 DIAGNOSIS — I5023 Acute on chronic systolic (congestive) heart failure: Secondary | ICD-10-CM | POA: Insufficient documentation

## 2023-11-08 DIAGNOSIS — F1410 Cocaine abuse, uncomplicated: Secondary | ICD-10-CM | POA: Diagnosis present

## 2023-11-08 DIAGNOSIS — R600 Localized edema: Secondary | ICD-10-CM | POA: Diagnosis present

## 2023-11-08 DIAGNOSIS — E7800 Pure hypercholesterolemia, unspecified: Secondary | ICD-10-CM | POA: Diagnosis present

## 2023-11-08 DIAGNOSIS — F1910 Other psychoactive substance abuse, uncomplicated: Secondary | ICD-10-CM | POA: Diagnosis not present

## 2023-11-08 DIAGNOSIS — Z8673 Personal history of transient ischemic attack (TIA), and cerebral infarction without residual deficits: Secondary | ICD-10-CM

## 2023-11-08 DIAGNOSIS — Z951 Presence of aortocoronary bypass graft: Secondary | ICD-10-CM | POA: Diagnosis not present

## 2023-11-08 DIAGNOSIS — I2510 Atherosclerotic heart disease of native coronary artery without angina pectoris: Secondary | ICD-10-CM | POA: Diagnosis present

## 2023-11-08 DIAGNOSIS — F411 Generalized anxiety disorder: Secondary | ICD-10-CM

## 2023-11-08 DIAGNOSIS — Z515 Encounter for palliative care: Secondary | ICD-10-CM | POA: Diagnosis not present

## 2023-11-08 DIAGNOSIS — I472 Ventricular tachycardia, unspecified: Secondary | ICD-10-CM | POA: Diagnosis present

## 2023-11-08 DIAGNOSIS — J9601 Acute respiratory failure with hypoxia: Secondary | ICD-10-CM | POA: Diagnosis not present

## 2023-11-08 DIAGNOSIS — I447 Left bundle-branch block, unspecified: Secondary | ICD-10-CM | POA: Diagnosis present

## 2023-11-08 DIAGNOSIS — J432 Centrilobular emphysema: Secondary | ICD-10-CM | POA: Diagnosis present

## 2023-11-08 DIAGNOSIS — G9341 Metabolic encephalopathy: Secondary | ICD-10-CM | POA: Diagnosis not present

## 2023-11-08 DIAGNOSIS — R52 Pain, unspecified: Secondary | ICD-10-CM | POA: Diagnosis not present

## 2023-11-08 DIAGNOSIS — Z7901 Long term (current) use of anticoagulants: Secondary | ICD-10-CM | POA: Diagnosis not present

## 2023-11-08 DIAGNOSIS — Z560 Unemployment, unspecified: Secondary | ICD-10-CM | POA: Diagnosis not present

## 2023-11-08 DIAGNOSIS — N179 Acute kidney failure, unspecified: Secondary | ICD-10-CM | POA: Diagnosis not present

## 2023-11-08 DIAGNOSIS — I4891 Unspecified atrial fibrillation: Secondary | ICD-10-CM | POA: Diagnosis present

## 2023-11-08 DIAGNOSIS — N1831 Chronic kidney disease, stage 3a: Secondary | ICD-10-CM

## 2023-11-08 DIAGNOSIS — I5043 Acute on chronic combined systolic (congestive) and diastolic (congestive) heart failure: Secondary | ICD-10-CM | POA: Diagnosis present

## 2023-11-08 DIAGNOSIS — R451 Restlessness and agitation: Secondary | ICD-10-CM | POA: Diagnosis not present

## 2023-11-08 DIAGNOSIS — E785 Hyperlipidemia, unspecified: Secondary | ICD-10-CM

## 2023-11-08 DIAGNOSIS — F419 Anxiety disorder, unspecified: Secondary | ICD-10-CM | POA: Insufficient documentation

## 2023-11-08 DIAGNOSIS — R570 Cardiogenic shock: Secondary | ICD-10-CM | POA: Diagnosis present

## 2023-11-08 DIAGNOSIS — I081 Rheumatic disorders of both mitral and tricuspid valves: Secondary | ICD-10-CM | POA: Diagnosis present

## 2023-11-08 DIAGNOSIS — R0609 Other forms of dyspnea: Secondary | ICD-10-CM | POA: Diagnosis not present

## 2023-11-08 DIAGNOSIS — Z8619 Personal history of other infectious and parasitic diseases: Secondary | ICD-10-CM

## 2023-11-08 LAB — CBC
HCT: 40.7 % (ref 39.0–52.0)
Hemoglobin: 12.7 g/dL — ABNORMAL LOW (ref 13.0–17.0)
MCH: 24.1 pg — ABNORMAL LOW (ref 26.0–34.0)
MCHC: 31.2 g/dL (ref 30.0–36.0)
MCV: 77.4 fL — ABNORMAL LOW (ref 80.0–100.0)
Platelets: 193 K/uL (ref 150–400)
RBC: 5.26 MIL/uL (ref 4.22–5.81)
RDW: 18.3 % — ABNORMAL HIGH (ref 11.5–15.5)
WBC: 9.8 K/uL (ref 4.0–10.5)
nRBC: 0.4 % — ABNORMAL HIGH (ref 0.0–0.2)

## 2023-11-08 LAB — BASIC METABOLIC PANEL WITH GFR
Anion gap: 15 (ref 5–15)
BUN: 59 mg/dL — ABNORMAL HIGH (ref 6–20)
CO2: 24 mmol/L (ref 22–32)
Calcium: 9.4 mg/dL (ref 8.9–10.3)
Chloride: 89 mmol/L — ABNORMAL LOW (ref 98–111)
Creatinine, Ser: 1.18 mg/dL (ref 0.61–1.24)
GFR, Estimated: >60 mL/min (ref >60)
Glucose, Bld: 110 mg/dL — ABNORMAL HIGH (ref 70–99)
Potassium: 4.3 mmol/L (ref 3.5–5.1)
Sodium: 128 mmol/L — ABNORMAL LOW (ref 135–145)

## 2023-11-08 LAB — BRAIN NATRIURETIC PEPTIDE: B Natriuretic Peptide: >4500 pg/mL — ABNORMAL HIGH (ref 0.0–100.0)

## 2023-11-08 MED ORDER — ESCITALOPRAM OXALATE 10 MG PO TABS
10.0000 mg | ORAL_TABLET | Freq: Every day | ORAL | Status: DC
Start: 2023-11-08 — End: 2023-11-10
  Administered 2023-11-08 – 2023-11-09 (×3): 10 mg via ORAL
  Filled 2023-11-08 (×3): qty 1

## 2023-11-08 MED ORDER — FUROSEMIDE 10 MG/ML IJ SOLN
60.0000 mg | Freq: Two times a day (BID) | INTRAMUSCULAR | Status: DC
  Administered 2023-11-08 – 2023-11-09 (×3): 60 mg via INTRAVENOUS
  Filled 2023-11-08 (×2): qty 6

## 2023-11-08 MED ORDER — TRIMETHOBENZAMIDE HCL 100 MG/ML IM SOLN: 200.0000 mg | Freq: Four times a day (QID) | INTRAMUSCULAR | Status: DC | PRN

## 2023-11-08 MED ORDER — ACETAMINOPHEN 325 MG PO TABS: 650.0000 mg | ORAL_TABLET | Freq: Four times a day (QID) | ORAL | Status: DC | PRN

## 2023-11-08 MED ORDER — QUETIAPINE FUMARATE 50 MG PO TABS
25.0000 mg | ORAL_TABLET | Freq: Every day | ORAL | Status: DC
Start: 2023-11-08 — End: 2023-11-10
  Administered 2023-11-08 – 2023-11-09 (×3): 25 mg via ORAL
  Filled 2023-11-08 (×2): qty 1

## 2023-11-08 MED ORDER — MIDODRINE HCL 5 MG PO TABS
5.0000 mg | ORAL_TABLET | Freq: Three times a day (TID) | ORAL | Status: DC
  Administered 2023-11-08 – 2023-11-09 (×8): 5 mg via ORAL
  Filled 2023-11-08 (×6): qty 1

## 2023-11-08 MED ORDER — FUROSEMIDE 10 MG/ML IJ SOLN
40.0000 mg | Freq: Two times a day (BID) | INTRAMUSCULAR | Status: DC
  Administered 2023-11-08 (×2): 40 mg via INTRAVENOUS
  Filled 2023-11-08 (×2): qty 4

## 2023-11-08 MED ORDER — LORAZEPAM 1 MG PO TABS
1.0000 mg | ORAL_TABLET | ORAL | Status: DC | PRN
  Administered 2023-11-08 – 2023-11-10 (×11): 1 mg via ORAL
  Filled 2023-11-08 (×7): qty 1

## 2023-11-08 MED ORDER — SENNOSIDES-DOCUSATE SODIUM 8.6-50 MG PO TABS
1.0000 | ORAL_TABLET | Freq: Every evening | ORAL | Status: DC | PRN
Start: 2023-11-08 — End: 2023-11-10

## 2023-11-08 MED ORDER — SPIRONOLACTONE 12.5 MG HALF TABLET
12.5000 mg | ORAL_TABLET | Freq: Every day | ORAL | Status: DC
  Administered 2023-11-08 – 2023-11-09 (×3): 12.5 mg via ORAL
  Filled 2023-11-08 (×2): qty 1

## 2023-11-08 MED ORDER — IOHEXOL 350 MG/ML SOLN
75.0000 mL | Freq: Once | INTRAVENOUS | Status: AC | PRN
  Administered 2023-11-08: 75 mL via INTRAVENOUS

## 2023-11-08 MED ORDER — IBUPROFEN 200 MG PO TABS: 400.0000 mg | ORAL_TABLET | Freq: Four times a day (QID) | ORAL | Status: DC | PRN

## 2023-11-08 MED ORDER — ENOXAPARIN SODIUM 40 MG/0.4ML IJ SOSY
40.0000 mg | PREFILLED_SYRINGE | Freq: Every day | INTRAMUSCULAR | Status: DC
  Administered 2023-11-08 – 2023-11-09 (×3): 40 mg via SUBCUTANEOUS
  Filled 2023-11-08 (×3): qty 0.4

## 2023-11-08 MED ORDER — ACETAMINOPHEN 650 MG RE SUPP: 650.0000 mg | Freq: Four times a day (QID) | RECTAL | Status: DC | PRN

## 2023-11-08 MED ORDER — SODIUM CHLORIDE 0.9% FLUSH
3.0000 mL | Freq: Two times a day (BID) | INTRAVENOUS | Status: DC
  Administered 2023-11-08 – 2023-11-12 (×10): 3 mL via INTRAVENOUS

## 2023-11-08 MED ORDER — METHOCARBAMOL 500 MG PO TABS
500.0000 mg | ORAL_TABLET | Freq: Four times a day (QID) | ORAL | Status: DC | PRN
  Administered 2023-11-08: 500 mg via ORAL
  Filled 2023-11-08: qty 1

## 2023-11-08 NOTE — Assessment & Plan Note (Signed)
Patient not on statin therapy 

## 2023-11-08 NOTE — ED Notes (Signed)
 Pt c/o cramps in his legs, gave prn to help.

## 2023-11-08 NOTE — Assessment & Plan Note (Addendum)
 Hyponatremia,   Renal function with serum cr at 1,31 with K at 3,6 and serum bicarbonate at 27  Na 128 and Mg 2.3   Plan to continue diuresis with furosemide  Add 40 meq Kcl.  Follow up renal function and electrolytes in am. Avoid hypotension or nephrotoxic medications.

## 2023-11-08 NOTE — ED Notes (Addendum)
 Pt complaining about increased abdominal pain. NT went into room to find pt hunched over the side of the stretcher with family surrounding him. Family seems to be increasingly concerned about pt's appearance. Primary RN aware.

## 2023-11-08 NOTE — Progress Notes (Signed)
 Progress Note   Patient: John Davenport FMW:[id number] DOB: 1975-11-15 DOA: 11/07/2023     0 DOS: the patient was seen and examined on 11/08/2023   Brief hospital course: John Davenport was admitted to the hospital with the working diagnosis of heart failure exacerbation.   48 yo male with the past medical history of coronary artery disease, sp CABG, heart failure, paroxysmal ventricular tachycardia, CKD, polysubstance abuse and chronic hepatitis C who presented with dyspnea, edema and abdominal pain.  Recent hospitalization 07/29 to 10/29/23 for heart failure decompensations, he left against medical advice.  Reported progressive and worsening symptoms, for several weeks. Apparently he has not been adherent to his cardiovascular medications.  On his initial physical examination his blood pressure was 102/78, HR 101, RR 29 and 02 saturation 98%. Lungs with no wheezing or rhonchi, positive increased work of breathing, abdomen with no distention and positive lower extremity edema.   Na 126, K 4.5 Cl 90, bicarbonate 22 glucose 109, bun 61 cr 1,11 BNP > 4.500  High sensitive troponin 23  Wbc 9,7 hgb 11.3 plt 205   Urine analysis SG 1,016, protein 30, leukocytes negative, hgb negative  Toxicology screen positive for cocaine .   Chest radiograph with cardiomegaly, bilateral hilar vascular congestion, with prominent pulmonary artery on the right. Central bilateral interstitial infiltrates.   CT chest/ abdomen and pelvis with bilateral ground glass opacities with interlobular septal thickening. No pulmonary embolism, positive centrilobular emphysema, trace right pleural effusion, no acute intra abdominal pathology identified.   EKG 97 bpm, left axis deviation, left bundle branch block, qtc 520, sinus rhythm with bilateral atrial enlargement, no significant ST segment or T wave changes.    Assessment and Plan: * Acute on chronic systolic CHF (congestive heart failure) (HCC) Echocardiogram with  suspected small calcified thrombus in the LV apex, reduced LV systolic function with EF 20 to 25%, no wall motion abnormalities, mild to moderate dilatated LV cavity, RV with moderate reduction in systolic function, RV with mild enlargement, LA with moderate dilatation, mild MR, moderate tricuspid valve regurgitation.  Continue volume overloaded.  Systolic blood pressure 100 mmHg range   Plan to continue diuresis with furosemide , will increase to 60 mg IV bid and add spironolactone .  Will add midodrine  to allow better diuresis.  Limited medical therapy due to risk of hypotension. Advanced heart failure with poor prognosis.  Check limited echocardiogram for possible LV thrombus.    Paroxysmal VT, continue telemetry monitoring, keep K at 4 and Mg at 2  Acute hypoxemic respiratory failure due to acute cardiogenic pulmonary edema Continue diuresis Supplemental 02 per [place], continue oxymetry monitoring   CAD in native artery Mild troponin elevation due to heart failure no signs of acute coronary syndrome.   CKD stage 3a, GFR 45-59 ml/min (HCC) Hyponatremia,   Renal function with serum cr at 1,1 with K at 4,3 and serum bicarbonate at 24., Na 128   Plan to continue diuresis with furosemide  Follow up renal function and electrolytes in am. Avoid hypotension or nephrotoxic medications.   Hyperlipidemia Patient not on statin therapy   History of CVA (cerebrovascular accident) Continue blood pressure monitoring   History of hepatitis C virus infection Follow up as outpatient   Polysubstance abuse (HCC) Positive cocaine  intoxication  Will add lorazepam  as needed for anxiety   Generalized anxiety disorder Continue quetiapine  and escitalopram .      Subjective: patient deconditioned and weak, has been anxious and having lower extremity cramps, no chest pain. Positive dyspnea and edema  Physical Exam: Vitals:   11/08/23 0600 11/08/23 0700 11/08/23 0745 11/08/23 0855  BP: 105/86 (!)  117/96  (!) 109/93  Pulse:    (!) 43  Resp: 19 15  18   Temp:   98.5 F (36.9 C)   TempSrc:   Oral   SpO2:    100%  Weight:      Height:       Neurology awake and alert, deconditioned and ill looking appearing, poorly interactive  ENT with mild pallor  Cardiovascular S1 and S2 present and regular with no gallops or rubs Positive JVD Respiratory with rales at bases with no wheezing or rhonchi, poor inspiratory effort Abdomen with no distention  Positive lower extremity edema pitting ++  Data Reviewed:    Family Communication: I spoke with patient's mother at the bedside, we talked in detail about patient's condition, plan of care and prognosis and all questions were addressed.   Disposition: Status is: Inpatient Remains inpatient appropriate because: IV diuresis   Planned Discharge Destination: Home     Author: Elidia Toribio Furnace, MD 11/08/2023 10:13 AM  For on call review [email or web address].

## 2023-11-08 NOTE — Assessment & Plan Note (Signed)
Continue quetiapine and escitalopram

## 2023-11-08 NOTE — Hospital Course (Addendum)
 Mr. Heidinger was admitted to the hospital with the working diagnosis of heart failure exacerbation.   48 yo male with the past medical history of coronary artery disease, sp CABG, heart failure, paroxysmal ventricular tachycardia, CKD, polysubstance abuse and chronic hepatitis C who presented with dyspnea, edema and abdominal pain.  Recent hospitalization 07/29 to 10/29/23 for heart failure decompensations, he left against medical advice.  Reported progressive and worsening symptoms, for several weeks. Apparently he has not been adherent to his cardiovascular medications.  On his initial physical examination his blood pressure was 102/78, HR 101, RR 29 and 02 saturation 98%. Lungs with no wheezing or rhonchi, positive increased work of breathing, abdomen with no distention and positive lower extremity edema.   Na 126, K 4.5 Cl 90, bicarbonate 22 glucose 109, bun 61 cr 1,11 BNP > 4.500  High sensitive troponin 23  Wbc 9,7 hgb 11.3 plt 205   Urine analysis SG 1,016, protein 30, leukocytes negative, hgb negative  Toxicology screen positive for cocaine .   Chest radiograph with cardiomegaly, bilateral hilar vascular congestion, with prominent pulmonary artery on the right. Central bilateral interstitial infiltrates.   CT chest/ abdomen and pelvis with bilateral ground glass opacities with interlobular septal thickening. No pulmonary embolism, positive centrilobular emphysema, trace right pleural effusion, no acute intra abdominal pathology identified.   EKG 97 bpm, left axis deviation, left bundle branch block, qtc 520, sinus rhythm with bilateral atrial enlargement, no significant ST segment or T wave changes.   08/11 patient having anxiety and not feeling well, this morning having low temperature.  08/12 continue poor prognosis, palliative care consulted. Priority to keep patient comfortable and avoid suffering.

## 2023-11-08 NOTE — H&P (Addendum)
 History and Physical    John Davenport FMW:[id number] DOB: 1976-03-12 DOA: 11/07/2023  PCP: Pcp, No   Patient coming from: home   Chief Complaint: SOB, leg swelling, abdominal pain  HPI: John Davenport is a 48 y.o. male with medical history significant for CAD status post CABG, ischemic cardiomyopathy with EF 20 to 25%, paroxysmal VT previously on amiodarone , MSSA bacteremia with removal of ICD, CKD 3A, polysubstance abuse, hepatitis C, and noncompliance with medications, now presenting with shortness of breath, leg swelling, and abdominal pain.  Patient reports insidiously worsening shortness of breath, bilateral lower extremity edema, chest discomfort, and generalized abdominal pain.  He recently started Lexapro  and Seroquel  for anxiety with panic attacks, reports adherence with these, but has not taken any of his other medications since the recent hospitalization.  He reports using cocaine  2 days ago, expresses a desire to quit, and states that he no longer uses opiates.  ED Course: Upon arrival to the ED, patient is found to be afebrile and saturating well on room air with tachypnea, mild tachycardia, and SBP in the upper 90s to low 100s.  EKG feature sinus rhythm with LBBB and prolonged QT interval.  Chest x-ray reveals mild pulmonary edema.  CTA chest is negative for PE but notable for mild emphysema, mild interstitial edema, and reflux of contrast into the hepatic veins.  There are no acute findings on CT of the abdomen and pelvis.  Labs are most notable for sodium 126, BUN 61, normal WBC, troponin 22, UDS positive for cocaine , and BNP > 4500.  Blood cultures were collected in the ED and the patient was given a dose of Ativan .  Review of Systems:  All other systems reviewed and apart from HPI, are negative.  Past Medical History:  Diagnosis Date   Anxiety state, unspecified    Back pain    Cancer (HCC)    Lung CA   Chronic systolic heart failure (HCC)    a. Ischemic CM  (EF 30% by echo in 2011) b. echo 08/2015: EF 20% with severe HK and AK of the inferoseptal and apical myocardium.   Coronary artery disease    a. 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  b. eventually required single vessel CABG (L-LAD) in 03/2009 due to restenosis. c. cath 2013 showed patent LIMA sequential to the diags with LAD filling retrograde, no new disease in RCA and Cx, low LVEDP, sx felt noncardiac. d. 08/2015: NSTEMI: CTO of prox LAD, 99% stenosis of small dCx (no stent), + for cocaine    Esophageal reflux    Heart attack (HCC)    History of hepatitis C virus infection 07/15/2018   07/20/18 Viral RNA negative suggest past, cleared infection   Hypercholesteremia    ICD (implantable cardioverter-defibrillator) infection (HCC)    a. [employer] ICD 12/2008.    Ischemic cardiomyopathy    echocardiogram 12/11: Mild LVH, EF 30-35%, anteroseptal and apical akinesis, grade 1 diastolic dysfunction   LBP (low back pain)    OSA (obstructive sleep apnea) 12/30/2010   Paroxysmal ventricular tachycardia (HCC)    Stroke (HCC)    Tobacco abuse     Past Surgical History:  Procedure Laterality Date   BUBBLE STUDY  11/30/2019   Procedure: BUBBLE STUDY;  Surgeon: Loni Soyla DELENA, MD;  Location: [facility] ENDOSCOPY;  Service: Cardiology;;   CARDIAC CATHETERIZATION N/A 09/06/2015   Procedure: Left Heart Cath and Cors/Grafts Angiography;  Surgeon: Ozell Fell, MD;  Location: [facility] INVASIVE CV LAB;  Service: Cardiovascular;  Laterality: N/A;   CARDIAC DEFIBRILLATOR PLACEMENT     CARDIAC DEFIBRILLATOR PLACEMENT     2010 by [facility]   CORONARY ANGIOPLASTY WITH STENT PLACEMENT     LAD stenting 10/09 and re-opening 6/10   CORONARY ARTERY BYPASS GRAFT     1/11   ICD LEAD REMOVAL Left 07/16/2018   Procedure: ICD LEAD REMOVAL;  Surgeon: Waddell Danelle ORN, MD;  Location: [facility] OR;  Service: Cardiovascular;  Laterality: Left;  Owen back up   IR GENERIC HISTORICAL  05/21/2016   IR  PERCUTANEOUS ART THROMBECTOMY/INFUSION INTRACRANIAL INC DIAG ANGIO 05/21/2016 Thyra Nash, MD [facility]-INTERV RAD   IR GENERIC HISTORICAL  06/16/2016   IR RADIOLOGIST EVAL & MGMT 06/16/2016 [facility]   LEFT HEART CATHETERIZATION WITH CORONARY ANGIOGRAM N/A 08/07/2011   Procedure: LEFT HEART CATHETERIZATION WITH CORONARY ANGIOGRAM;  Surgeon: Maude JAYSON Emmer, MD;  Location: [facility] CATH LAB;  Service: Cardiovascular;  Laterality: N/A;   RADIOLOGY WITH ANESTHESIA N/A 05/21/2016   Procedure: RADIOLOGY WITH ANESTHESIA;  Surgeon: Thyra Nash, MD;  Location: [facility] OR;  Service: Radiology;  Laterality: N/A;   RIGHT/LEFT HEART CATH AND CORONARY ANGIOGRAPHY N/A 02/03/2018   Procedure: RIGHT/LEFT HEART CATH AND CORONARY ANGIOGRAPHY;  Surgeon: Rolan Ezra RAMAN, MD;  Location: [facility] INVASIVE CV LAB;  Service: Cardiovascular;  Laterality: N/A;   TEE WITHOUT CARDIOVERSION N/A 07/16/2018   Procedure: TRANSESOPHAGEAL ECHOCARDIOGRAM (TEE);  Surgeon: Waddell Danelle ORN, MD;  Location: [facility] OR;  Service: Cardiovascular;  Laterality: N/A;   TEE WITHOUT CARDIOVERSION N/A 11/30/2019   Procedure: TRANSESOPHAGEAL ECHOCARDIOGRAM (TEE);  Surgeon: Loni Soyla LABOR, MD;  Location: [facility] ENDOSCOPY;  Service: Cardiology;  Laterality: N/A;    Social History:   reports that he has been smoking cigarettes. He has a 10 pack-year smoking history. He has quit using smokeless tobacco.  His smokeless tobacco use included chew. He reports current drug use. Drug: Cocaine . He reports that he does not drink alcohol.  No Known Allergies  Family History  Problem Relation Age of Onset   Heart attack Father 52       died at 16 after multiple MI's   COPD Maternal Grandmother      Prior to Admission medications   Medication Sig Start Date End Date Taking? Authorizing Provider  furosemide  (LASIX ) 40 MG tablet Take 1 tablet (40 mg total) by mouth 2 (two) times daily. With extra 40 mg as needed for 3 pound weight gain. Patient taking differently: Take 40  mg by mouth daily as needed for edema or fluid. With extra 40 mg as needed for 3 pound weight gain. 03/19/20   Glena Harlene HERO, FNP    Physical Exam: Vitals:   11/07/23 2220 11/07/23 2240 11/07/23 2300 11/08/23 0139  BP: 102/78 99/87 (!) 140/116 101/86  Pulse: (!) 101 90 (!) 101 (!) 101  Resp: (!) 29 19 (!) 23 20  Temp:    98.4 F (36.9 C)  TempSrc:    Oral  SpO2: 100% 100% 99% 98%  Weight:      Height:        Constitutional: NAD, no pallor or diaphoresis   Eyes: PERTLA, lids and conjunctivae normal ENMT: Mucous membranes are moist. Posterior pharynx clear of any exudate or lesions.   Neck: supple, no masses  Respiratory: Labored respirations. No wheezing.   Cardiovascular: S1 & S2 heard, regular rate and rhythm. Pretibial pitting edema. JVD. Abdomen: Soft, no guarding. Bowel sounds active.  Musculoskeletal: no clubbing /  cyanosis. No joint deformity upper and lower extremities.   Skin: no significant rashes, lesions, ulcers. Warm, dry, well-perfused. Neurologic: CN 2-12 grossly intact. Moving all extremities. Alert and oriented.  Psychiatric: Calm. Cooperative.    Labs and Imaging on Admission: I have personally reviewed following labs and imaging studies  CBC: Recent Labs  Lab 11/03/23 1945 11/03/23 1946 11/07/23 2317  WBC 10.2  --  9.7  HGB 13.0 16.3 11.3*  HCT 43.2 48.0 36.0*  MCV 80.0  --  76.8*  PLT 276  --  205   Basic Metabolic Panel: Recent Labs  Lab 11/03/23 1945 11/03/23 1946 11/07/23 2317  NA 129* 133* 126*  K 4.6 4.8 4.5  CL 88*  --  90*  CO2 24  --  22  GLUCOSE 110*  --  109*  BUN 47*  --  61*  CREATININE 1.35*  --  1.11  CALCIUM  10.0  --  9.3   GFR: Estimated Creatinine Clearance: 81.8 mL/min (by C-G formula based on SCr of 1.11 mg/dL). Liver Function Tests: No results for input(s): AST, ALT, ALKPHOS, BILITOT, PROT, ALBUMIN  in the last 168 hours. No results for input(s): LIPASE, AMYLASE in the last 168 hours. No results  for input(s): AMMONIA in the last 168 hours. Coagulation Profile: No results for input(s): INR, PROTIME in the last 168 hours. Cardiac Enzymes: No results for input(s): CKTOTAL, CKMB, CKMBINDEX, TROPONINI in the last 168 hours. BNP (last 3 results) No results for input(s): PROBNP in the last 8760 hours. HbA1C: No results for input(s): HGBA1C in the last 72 hours. CBG: No results for input(s): GLUCAP in the last 168 hours. Lipid Profile: No results for input(s): CHOL, HDL, LDLCALC, TRIG, CHOLHDL, LDLDIRECT in the last 72 hours. Thyroid  Function Tests: No results for input(s): TSH, T4TOTAL, FREET4, T3FREE, THYROIDAB in the last 72 hours. Anemia Panel: No results for input(s): VITAMINB12, FOLATE, FERRITIN, TIBC, IRON, RETICCTPCT in the last 72 hours. Urine analysis:    Component Value Date/Time   COLORURINE YELLOW 11/07/2023 2202   APPEARANCEUR CLEAR 11/07/2023 2202   LABSPEC 1.016 11/07/2023 2202   PHURINE 5.0 11/07/2023 2202   GLUCOSEU NEGATIVE 11/07/2023 2202   HGBUR NEGATIVE 11/07/2023 2202   BILIRUBINUR NEGATIVE 11/07/2023 2202   KETONESUR NEGATIVE 11/07/2023 2202   PROTEINUR 30 (A) 11/07/2023 2202   UROBILINOGEN 0.2 04/09/2009 1132   NITRITE NEGATIVE 11/07/2023 2202   LEUKOCYTESUR NEGATIVE 11/07/2023 2202   Sepsis Labs: @LABRCNTIP (procalcitonin:4,lacticidven:4) )No results found for this or any previous visit (from the past 240 hours).   Radiological Exams on Admission: CT Angio Chest PE W and/or Wo Contrast Result Date: 11/08/2023 CLINICAL DATA:  Pulmonary embolism (PE) suspected, high prob; LLQ abdominal pain RLQ abdominal pain. Chest pain, dyspnea, lower abdominal EXAM: CT ANGIOGRAPHY CHEST CT ABDOMEN AND PELVIS WITH CONTRAST TECHNIQUE: Multidetector CT imaging of the chest was performed using the standard protocol during bolus administration of intravenous contrast. Multiplanar CT image reconstructions and MIPs were  obtained to evaluate the vascular anatomy. Multidetector CT imaging of the abdomen and pelvis was performed using the standard protocol during bolus administration of intravenous contrast. RADIATION DOSE REDUCTION: This exam was performed according to the departmental dose-optimization program which includes automated exposure control, adjustment of the mA and/or kV according to patient size and/or use of iterative reconstruction technique. CONTRAST:  75mL OMNIPAQUE  IOHEXOL  350 MG/ML SOLN COMPARISON:  None Available. FINDINGS: CTA CHEST FINDINGS Cardiovascular: Adequate opacification of the pulmonary arterial tree. No intraluminal filling defect identified to suggest acute pulmonary  embolism. Central pulmonary arteries are of normal caliber. Status post coronary artery bypass grafting. Cardiac size is at the upper limits of normal with left ventricular dilation noted and subendocardial calcification within the left ventricular apex related to prior myocardial infarction. Reflux of contrast into the hepatic venous systems in keeping with least some degree right heart failure. No pericardial effusion. No significant atherosclerotic calcification within the thoracic aorta. No aortic aneurysm. Mediastinum/Nodes: No enlarged mediastinal, hilar, or axillary lymph nodes. Thyroid  gland, trachea, and esophagus demonstrate no significant findings. Lungs/Pleura: Mild emphysema. Mild interstitial pulmonary edema. Trace right pleural effusion. No pneumothorax. No central obstructing lesion. Musculoskeletal: No acute bone abnormality. No lytic or blastic bone lesion. Segmentation anomaly with incomplete segmentation of T5-6 Review of the MIP images confirms the above findings. CT ABDOMEN and PELVIS FINDINGS Hepatobiliary: No focal liver abnormality is seen. No gallstones, gallbladder wall thickening, or biliary dilatation. Pancreas: Unremarkable Spleen: Unremarkable Adrenals/Urinary Tract: Adrenal glands are unremarkable. Kidneys  are normal, without renal calculi, focal lesion, or hydronephrosis. Bladder is unremarkable. Stomach/Bowel: Stomach is within normal limits. Appendix appears normal. No evidence of bowel wall thickening, distention, or inflammatory changes. Vascular/Lymphatic: Aortic atherosclerosis. No enlarged abdominal or pelvic lymph nodes. Reproductive: Prostate is unremarkable. Other: No abdominal wall hernia or abnormality. No abdominopelvic ascites. Musculoskeletal: No acute bone abnormality. No lytic or blastic bone lesion. Segmentation anomaly with incomplete segmentation of L2-3. Review of the MIP images confirms the above findings. IMPRESSION: 1. No pulmonary embolism. 2. Mild emphysema. 3. Status post coronary artery bypass grafting. Left ventricular dilation. 4. Mild interstitial pulmonary edema. Trace right pleural effusion. Reflux of contrast into the hepatic venous systems in keeping with at least some degree of right heart failure. 5. No acute intra-abdominal pathology identified. No definite radiographic explanation for the patient's reported symptoms. Aortic Atherosclerosis (ICD10-I70.0) and Emphysema (ICD10-J43.9). Electronically Signed   By: Dorethia Molt M.D.   On: 11/08/2023 01:09   CT ABDOMEN PELVIS W CONTRAST Result Date: 11/08/2023 CLINICAL DATA:  Pulmonary embolism (PE) suspected, high prob; LLQ abdominal pain RLQ abdominal pain. Chest pain, dyspnea, lower abdominal EXAM: CT ANGIOGRAPHY CHEST CT ABDOMEN AND PELVIS WITH CONTRAST TECHNIQUE: Multidetector CT imaging of the chest was performed using the standard protocol during bolus administration of intravenous contrast. Multiplanar CT image reconstructions and MIPs were obtained to evaluate the vascular anatomy. Multidetector CT imaging of the abdomen and pelvis was performed using the standard protocol during bolus administration of intravenous contrast. RADIATION DOSE REDUCTION: This exam was performed according to the departmental dose-optimization  program which includes automated exposure control, adjustment of the mA and/or kV according to patient size and/or use of iterative reconstruction technique. CONTRAST:  75mL OMNIPAQUE  IOHEXOL  350 MG/ML SOLN COMPARISON:  None Available. FINDINGS: CTA CHEST FINDINGS Cardiovascular: Adequate opacification of the pulmonary arterial tree. No intraluminal filling defect identified to suggest acute pulmonary embolism. Central pulmonary arteries are of normal caliber. Status post coronary artery bypass grafting. Cardiac size is at the upper limits of normal with left ventricular dilation noted and subendocardial calcification within the left ventricular apex related to prior myocardial infarction. Reflux of contrast into the hepatic venous systems in keeping with least some degree right heart failure. No pericardial effusion. No significant atherosclerotic calcification within the thoracic aorta. No aortic aneurysm. Mediastinum/Nodes: No enlarged mediastinal, hilar, or axillary lymph nodes. Thyroid  gland, trachea, and esophagus demonstrate no significant findings. Lungs/Pleura: Mild emphysema. Mild interstitial pulmonary edema. Trace right pleural effusion. No pneumothorax. No central obstructing lesion. Musculoskeletal: No acute bone  abnormality. No lytic or blastic bone lesion. Segmentation anomaly with incomplete segmentation of T5-6 Review of the MIP images confirms the above findings. CT ABDOMEN and PELVIS FINDINGS Hepatobiliary: No focal liver abnormality is seen. No gallstones, gallbladder wall thickening, or biliary dilatation. Pancreas: Unremarkable Spleen: Unremarkable Adrenals/Urinary Tract: Adrenal glands are unremarkable. Kidneys are normal, without renal calculi, focal lesion, or hydronephrosis. Bladder is unremarkable. Stomach/Bowel: Stomach is within normal limits. Appendix appears normal. No evidence of bowel wall thickening, distention, or inflammatory changes. Vascular/Lymphatic: Aortic atherosclerosis.  No enlarged abdominal or pelvic lymph nodes. Reproductive: Prostate is unremarkable. Other: No abdominal wall hernia or abnormality. No abdominopelvic ascites. Musculoskeletal: No acute bone abnormality. No lytic or blastic bone lesion. Segmentation anomaly with incomplete segmentation of L2-3. Review of the MIP images confirms the above findings. IMPRESSION: 1. No pulmonary embolism. 2. Mild emphysema. 3. Status post coronary artery bypass grafting. Left ventricular dilation. 4. Mild interstitial pulmonary edema. Trace right pleural effusion. Reflux of contrast into the hepatic venous systems in keeping with at least some degree of right heart failure. 5. No acute intra-abdominal pathology identified. No definite radiographic explanation for the patient's reported symptoms. Aortic Atherosclerosis (ICD10-I70.0) and Emphysema (ICD10-J43.9). Electronically Signed   By: Dorethia Molt M.D.   On: 11/08/2023 01:09   DG Chest 2 View Result Date: 11/07/2023 CLINICAL DATA:  Chest pain, sob; hx of lung cancer, CHF EXAM: CHEST - 2 VIEW COMPARISON:  Chest x-ray 11/03/2023 FINDINGS: The heart and mediastinal contours are unchanged. No focal consolidation. Mild pulmonary edema. No pleural effusion. No pneumothorax. No acute osseous abnormality.  Sternotomy wires are intact. IMPRESSION: Mild pulmonary edema. Electronically Signed   By: Morgane  Naveau M.D.   On: 11/07/2023 18:11    EKG: Independently reviewed. Sinus rhythm, LBBB.   Assessment/Plan   1. Acute on chronic combined systolic & diastolic CHF  - EF was 20-25% in July 2025 with grade 1 diastolic dysfunction, moderately reduced RV systolic function, mild MR, and moderate TR  - Diurese with IV Lasix , monitor weight and I/Os, monitor renal function and electrolytes    2. CAD  - He describes atypical chest pain in ED  - Troponin slightly elevated and flat in ED, not suggestive of ACS   - He has been non-compliant with his prescribed medications    3.  Hyponatremia  - Serum sodium 126 in setting of acute CHF    - Monitor closely while diuresing   4. Hx of CVA  - Suspected to be cardioembolic  - Previously prescribed statin and Xarelto  but was not taking them    5. Paroxysmal VT  - ICD removed when bacteremic a few years ago; he was previously on amiodarone  but non-compliant  - Continue cardiac monitoring   6. Polysubstance abuse  - Reports that he is no longer using opiates, still using cocaine  but wants to quit - Consult TOC   7. Anxiety  - Continue Lexapro  and Seroquel     DVT prophylaxis: Lovenox   Code Status: Full  Level of Care: Level of care: Telemetry Cardiac Family Communication: Mother at bedside   Disposition Plan:  Patient is from: home  Anticipated d/c is to: TBD Anticipated d/c date is: 11/11/23  Patient currently: pending improved volume status, stable renal function and electrolytes  Consults called: none  Admission status: Inpatient     Evalene GORMAN Sprinkles, MD [facility]  11/08/2023, 2:06 AM

## 2023-11-08 NOTE — Assessment & Plan Note (Signed)
 Follow up as outpatient.

## 2023-11-08 NOTE — ED Notes (Signed)
 Pt reporting frequent panic attacks.

## 2023-11-08 NOTE — Assessment & Plan Note (Addendum)
 Echocardiogram with suspected small calcified thrombus in the LV apex, reduced LV systolic function with EF 20 to 25%, no wall motion abnormalities, mild to moderate dilatated LV cavity, RV with moderate reduction in systolic function, RV with mild enlargement, LA with moderate dilatation, mild MR, moderate tricuspid valve regurgitation.  Urine output 1,375 ml  Systolic blood pressure 100 mmHg range  Temp 96.6   Increase to 80 mg IV bid  Continue with spironolactone .  Check lactic acid  Blood pressure support with midodrine  to allow better diuresis.  Limited medical therapy due to risk of hypotension. Advanced heart failure with poor prognosis.  Check limited echocardiogram for possible LV thrombus.    Paroxysmal VT, continue telemetry monitoring, keep K at 4 and Mg at 2  Acute hypoxemic respiratory failure due to acute cardiogenic pulmonary edema Continue diuresis Supplemental 02 per [place], continue oxymetry monitoring

## 2023-11-08 NOTE — ED Notes (Signed)
 This RN gave patient water and sand which to eat.

## 2023-11-08 NOTE — Assessment & Plan Note (Signed)
 Continue blood pressure monitoring  Continue blood pressure control with losartan .

## 2023-11-08 NOTE — ED Provider Notes (Signed)
 Patient here with SOB.  Hx of CHF.  Recent admission for CHF, but left AMA.  Patient states that he wants to be admitted and get better and states that he won't leave this time.  CT PE negative for PE.  Notable for interstitial edema and small right pleural effusion.  States that he still feels short of breath.    BNP >4500.    Will consult hospitalist for admission.  Appreciate Dr. Charlton for admitting.   Vicky Charleston, PA-C 11/08/23 0200    Midge Golas, MD 11/08/23 0300

## 2023-11-08 NOTE — ED Notes (Signed)
 PT states his last injection was Saturday morning and he thought he was receiving heroin but states that wasn't no dope

## 2023-11-08 NOTE — Assessment & Plan Note (Signed)
 Mild troponin elevation due to heart failure no signs of acute coronary syndrome.

## 2023-11-08 NOTE — Assessment & Plan Note (Signed)
 Positive cocaine  intoxication  Will add lorazepam  as needed for anxiety

## 2023-11-09 ENCOUNTER — Inpatient Hospital Stay (HOSPITAL_COMMUNITY)

## 2023-11-09 DIAGNOSIS — I2510 Atherosclerotic heart disease of native coronary artery without angina pectoris: Secondary | ICD-10-CM | POA: Diagnosis not present

## 2023-11-09 DIAGNOSIS — I5023 Acute on chronic systolic (congestive) heart failure: Secondary | ICD-10-CM

## 2023-11-09 DIAGNOSIS — E785 Hyperlipidemia, unspecified: Secondary | ICD-10-CM | POA: Diagnosis not present

## 2023-11-09 DIAGNOSIS — N1831 Chronic kidney disease, stage 3a: Secondary | ICD-10-CM | POA: Diagnosis not present

## 2023-11-09 LAB — BASIC METABOLIC PANEL WITH GFR
Anion gap: 14 (ref 5–15)
BUN: 56 mg/dL — ABNORMAL HIGH (ref 6–20)
CO2: 27 mmol/L (ref 22–32)
Calcium: 9.4 mg/dL (ref 8.9–10.3)
Chloride: 87 mmol/L — ABNORMAL LOW (ref 98–111)
Creatinine, Ser: 1.31 mg/dL — ABNORMAL HIGH (ref 0.61–1.24)
GFR, Estimated: >60 mL/min (ref >60)
Glucose, Bld: 122 mg/dL — ABNORMAL HIGH (ref 70–99)
Potassium: 3.6 mmol/L (ref 3.5–5.1)
Sodium: 128 mmol/L — ABNORMAL LOW (ref 135–145)

## 2023-11-09 LAB — MAGNESIUM: Magnesium: 2.3 mg/dL (ref 1.7–2.4)

## 2023-11-09 LAB — ECHOCARDIOGRAM LIMITED
Est EF: <20
Height: 75 in
S' Lateral: 7.73 cm
Weight: 2511.48 [oz_av]

## 2023-11-09 LAB — LACTIC ACID, PLASMA
Lactic Acid, Venous: 4.1 mmol/L (ref 0.5–1.9)
Lactic Acid, Venous: 2.8 mmol/L (ref 0.5–1.9)

## 2023-11-09 MED ORDER — FUROSEMIDE 10 MG/ML IJ SOLN
20.0000 mg | Freq: Once | INTRAMUSCULAR | Status: AC
  Administered 2023-11-09 (×2): 20 mg via INTRAVENOUS
  Filled 2023-11-09: qty 2

## 2023-11-09 MED ORDER — POTASSIUM CHLORIDE CRYS ER 20 MEQ PO TBCR
40.0000 meq | EXTENDED_RELEASE_TABLET | Freq: Once | ORAL | Status: AC
  Administered 2023-11-09 (×2): 40 meq via ORAL

## 2023-11-09 MED ORDER — HYDROMORPHONE HCL 1 MG/ML IJ SOLN
1.0000 mg | INTRAMUSCULAR | Status: DC | PRN
  Administered 2023-11-09 – 2023-11-10 (×4): 1 mg via INTRAVENOUS
  Filled 2023-11-09 (×3): qty 1

## 2023-11-09 MED ORDER — FUROSEMIDE 10 MG/ML IJ SOLN
80.0000 mg | Freq: Two times a day (BID) | INTRAMUSCULAR | Status: DC
  Administered 2023-11-09 – 2023-11-10 (×4): 80 mg via INTRAVENOUS
  Filled 2023-11-09 (×2): qty 8

## 2023-11-09 NOTE — Progress Notes (Addendum)
 Progress Note   Patient: John Davenport FMW:[id number] DOB: 1975/04/22 DOA: 11/07/2023     1 DOS: the patient was seen and examined on 11/09/2023   Brief hospital course: John Davenport was admitted to the hospital with the working diagnosis of heart failure exacerbation.   48 yo male with the past medical history of coronary artery disease, sp CABG, heart failure, paroxysmal ventricular tachycardia, CKD, polysubstance abuse and chronic hepatitis C who presented with dyspnea, edema and abdominal pain.  Recent hospitalization 07/29 to 10/29/23 for heart failure decompensations, he left against medical advice.  Reported progressive and worsening symptoms, for several weeks. Apparently he has not been adherent to his cardiovascular medications.  On his initial physical examination his blood pressure was 102/78, HR 101, RR 29 and 02 saturation 98%. Lungs with no wheezing or rhonchi, positive increased work of breathing, abdomen with no distention and positive lower extremity edema.   Na 126, K 4.5 Cl 90, bicarbonate 22 glucose 109, bun 61 cr 1,11 BNP > 4.500  High sensitive troponin 23  Wbc 9,7 hgb 11.3 plt 205   Urine analysis SG 1,016, protein 30, leukocytes negative, hgb negative  Toxicology screen positive for cocaine .   Chest radiograph with cardiomegaly, bilateral hilar vascular congestion, with prominent pulmonary artery on the right. Central bilateral interstitial infiltrates.   CT chest/ abdomen and pelvis with bilateral ground glass opacities with interlobular septal thickening. No pulmonary embolism, positive centrilobular emphysema, trace right pleural effusion, no acute intra abdominal pathology identified.   EKG 97 bpm, left axis deviation, left bundle branch block, qtc 520, sinus rhythm with bilateral atrial enlargement, no significant ST segment or T wave changes.   08/11 patient having anxiety and not feeling well, this morning having low temperature.    Assessment and  Plan: * Acute on chronic systolic CHF (congestive heart failure) (HCC) Echocardiogram with suspected small calcified thrombus in the LV apex, reduced LV systolic function with EF 20 to 25%, no wall motion abnormalities, mild to moderate dilatated LV cavity, RV with moderate reduction in systolic function, RV with mild enlargement, LA with moderate dilatation, mild MR, moderate tricuspid valve regurgitation.  Urine output 1,375 ml  Systolic blood pressure 100 mmHg range  Temp 96.6   Increase to 80 mg IV bid  Continue with spironolactone .  Check lactic acid  Blood pressure support with midodrine  to allow better diuresis.  Limited medical therapy due to risk of hypotension. Advanced heart failure with poor prognosis.  Check limited echocardiogram for possible LV thrombus.    Paroxysmal VT, continue telemetry monitoring, keep K at 4 and Mg at 2  Acute hypoxemic respiratory failure due to acute cardiogenic pulmonary edema Continue diuresis Supplemental 02 per [place], continue oxymetry monitoring   CAD in native artery Mild troponin elevation due to heart failure no signs of acute coronary syndrome.   CKD stage 3a, GFR 45-59 ml/min (HCC) Hyponatremia,   Renal function with serum cr at 1,31 with K at 3,6 and serum bicarbonate at 27  Na 128 and Mg 2.3   Plan to continue diuresis with furosemide  Add 40 meq Kcl.  Follow up renal function and electrolytes in am. Avoid hypotension or nephrotoxic medications.   Hyperlipidemia Patient not on statin therapy   History of CVA (cerebrovascular accident) Continue blood pressure monitoring   History of hepatitis C virus infection Follow up as outpatient   Polysubstance abuse (HCC) Positive cocaine  intoxication  Continue with lorazepam  as needed for anxiety   Generalized anxiety disorder Continue  quetiapine  and escitalopram .    Subjective: patient not feeling well, having anxiety and not able to sleep, continue to have dyspnea but not  chest pain   Physical Exam: Vitals:   11/09/23 0318 11/09/23 0555 11/09/23 0722 11/09/23 0858  BP: (!) 115/90  97/70   Pulse: 96  95   Resp: 20  20   Temp: 98.1 F (36.7 C)   (!) 96.6 F (35.9 C)  TempSrc: Oral   Rectal  SpO2: 96%  100%   Weight:  71.2 kg    Height:       Neurology awake and alert, responding to questions and following commands, eyes closed, deconditioned and ill looking appearing  ENT with positive pallor with no icterus Cardiovascular with S1 and S2 present and regular with no gallops or rubs, positive systolic murmur at the right lower sternal border.  Respiratory with rales bilaterally with no wheezing or rhonchi  Abdomen with no distention  Lower extremity edema +  Data Reviewed:    Family Communication: no family at the bedside   Disposition: Status is: Inpatient Remains inpatient appropriate because: IV diuresis   Planned Discharge Destination: Home    Author: Elidia Toribio Furnace, MD 11/09/2023 10:30 AM  For on call review [email or web address].

## 2023-11-09 NOTE — Plan of Care (Signed)

## 2023-11-09 NOTE — Progress Notes (Signed)
 Advanced Heart Failure Note  Called by Dr. Arrien to discuss patient's condition. I reviewed his chart and discussed his case with Dr. Zenaida. Patient is not  an advanced therapies candidate due to noncompliance and ongoing IVDU. Given the severity of his biventricular heart failure and renal dysfunction, agree with palliative care discussions with family.   John Rendon Howell, NP 11/09/23  Advanced Heart Failure Team Pager [phone] (M-F; 7a - 5p)  Please contact [facility] for night-coverage after hours (4p -7a ) and weekends on [email or web address]

## 2023-11-09 NOTE — TOC CM/SW Note (Signed)
 Transition of Care [facility]) - Inpatient Brief Assessment   Patient Details  Name: John Davenport MRN: [id number] Date of Birth: 09-01-1975  Transition of Care [facility]) CM/SW Contact:    Lauraine FORBES Saa, LCSWA Phone Number: 11/09/2023, 2:40 PM   Clinical Narrative:  2:40 PM Per chart review, patient has transitioned to comfort care. Per MD notes, patient does not have the ability to make medical decisions due to decompensation and Palliative Care was consulted. TOC will continue to follow and be available to assist.   10:09 CSW acknowledged [facility] consult for substance use. Per chart review, patient resides at home with parent(s). Patient has insurance and is followed by [facility] but does not have a PCP. Patient does not have SNF or HH history. Patient was ordered a cane through [employer] in 2021. Patient's preferred pharmacy's are [facility], [facility] [address], [facility] [address], and [facility] [address]. TOC will continue to follow and be available to assist.  Transition of Care Asessment: Insurance and Status: Insurance coverage has been reviewed Patient has primary care physician: No (Followed by [facility]) Home environment has been reviewed: Private Residence Prior level of function:: N/A Prior/Current Home Services: No current home services Social Drivers of Health Review: SDOH reviewed no interventions necessary Readmission risk has been reviewed: Yes Transition of care needs: no transition of care needs at this time

## 2023-11-09 NOTE — Plan of Care (Signed)
   Problem: Coping: Goal: Level of anxiety will decrease Outcome: Progressing   Problem: Pain Managment: Goal: General experience of comfort will improve and/or be controlled Outcome: Progressing   Problem: Safety: Goal: Ability to remain free from injury will improve Outcome: Progressing

## 2023-11-09 NOTE — Progress Notes (Signed)
 Heart Failure Navigator Progress Note  Assessed for Heart & Vascular TOC clinic readiness.  Patient does not meet criteria due to has a scheduled [facility] appointment on 11/16/2023. No HF TOC . John Davenport   Navigator will sign off at this time.   Stephane Haddock, BSN, Scientist, clinical (histocompatibility and immunogenetics) Only

## 2023-11-09 NOTE — Progress Notes (Addendum)
 Through the morning patient has been decompensating, continue to have dyspnea and agitation. Has been confused.  Clinically hypervolemic with high lactic acid.  Patient likely developing cardiogenic shock.   Very poor prognosis, due to advanced heart failure, biventricular failure.   Her mother is at the bedside, I explained to her, patient's worsening condition and poor prognosis.  Patient can not make further advance care decisions due to acute encephalopathy, she has decided to change code status to DNR.   Will consult palliative care.

## 2023-11-09 NOTE — TOC CM/SW Note (Deleted)
 Transition of Care [facility]) - Inpatient Brief Assessment   Patient Details  Name: John Davenport MRN: [id number] Date of Birth: Nov 17, 1975  Transition of Care [facility]) CM/SW Contact:    Lauraine FORBES Saa, LCSWA Phone Number: 11/09/2023, 10:09 AM   Clinical Narrative:  10:09 AM Per chart review, patient resides at home with parent(s). Patient has a PCP and insurance. Patient does not have SNF or HH history. Patient was ordered a cane through [employer] in 2021. Patient's preferred pharmacy's are [facility], [facility] [address], [facility] [address], and [facility] [address]. No TOC needs were identified at this time. TOC will continue to follow and be available to assist.  Transition of Care Asessment: Insurance and Status: Insurance coverage has been reviewed Patient has primary care physician: No (Followed by [facility]) Home environment has been reviewed: Private Residence Prior level of function:: N/A Prior/Current Home Services: No current home services Social Drivers of Health Review: SDOH reviewed no interventions necessary Readmission risk has been reviewed: Yes Transition of care needs: no transition of care needs at this time

## 2023-11-10 DIAGNOSIS — N1831 Chronic kidney disease, stage 3a: Secondary | ICD-10-CM | POA: Diagnosis not present

## 2023-11-10 DIAGNOSIS — R451 Restlessness and agitation: Secondary | ICD-10-CM | POA: Diagnosis not present

## 2023-11-10 DIAGNOSIS — E785 Hyperlipidemia, unspecified: Secondary | ICD-10-CM | POA: Diagnosis not present

## 2023-11-10 DIAGNOSIS — R0609 Other forms of dyspnea: Secondary | ICD-10-CM

## 2023-11-10 DIAGNOSIS — Z515 Encounter for palliative care: Secondary | ICD-10-CM | POA: Diagnosis not present

## 2023-11-10 DIAGNOSIS — I5023 Acute on chronic systolic (congestive) heart failure: Secondary | ICD-10-CM | POA: Diagnosis not present

## 2023-11-10 DIAGNOSIS — I2510 Atherosclerotic heart disease of native coronary artery without angina pectoris: Secondary | ICD-10-CM | POA: Diagnosis not present

## 2023-11-10 LAB — BASIC METABOLIC PANEL WITH GFR
Anion gap: 14 (ref 5–15)
BUN: 53 mg/dL — ABNORMAL HIGH (ref 6–20)
CO2: 26 mmol/L (ref 22–32)
Calcium: 9.2 mg/dL (ref 8.9–10.3)
Chloride: 92 mmol/L — ABNORMAL LOW (ref 98–111)
Creatinine, Ser: 1.37 mg/dL — ABNORMAL HIGH (ref 0.61–1.24)
GFR, Estimated: >60 mL/min (ref >60)
Glucose, Bld: 98 mg/dL (ref 70–99)
Potassium: 3.9 mmol/L (ref 3.5–5.1)
Sodium: 132 mmol/L — ABNORMAL LOW (ref 135–145)

## 2023-11-10 LAB — CBC
HCT: 34 % — ABNORMAL LOW (ref 39.0–52.0)
Hemoglobin: 10.8 g/dL — ABNORMAL LOW (ref 13.0–17.0)
MCH: 24 pg — ABNORMAL LOW (ref 26.0–34.0)
MCHC: 31.8 g/dL (ref 30.0–36.0)
MCV: 75.6 fL — ABNORMAL LOW (ref 80.0–100.0)
Platelets: 170 K/uL (ref 150–400)
RBC: 4.5 MIL/uL (ref 4.22–5.81)
RDW: 17.5 % — ABNORMAL HIGH (ref 11.5–15.5)
WBC: 9 K/uL (ref 4.0–10.5)
nRBC: 1.3 % — ABNORMAL HIGH (ref 0.0–0.2)

## 2023-11-10 LAB — MAGNESIUM: Magnesium: 2 mg/dL (ref 1.7–2.4)

## 2023-11-10 MED ORDER — LORAZEPAM 2 MG/ML IJ SOLN
2.0000 mg | INTRAMUSCULAR | Status: DC | PRN
  Administered 2023-11-10 (×4): 2 mg via INTRAVENOUS
  Filled 2023-11-10: qty 1

## 2023-11-10 MED ORDER — LORAZEPAM 2 MG/ML IJ SOLN
2.0000 mg | INTRAMUSCULAR | Status: DC | PRN
  Administered 2023-11-10 – 2023-11-11 (×4): 2 mg via INTRAVENOUS
  Filled 2023-11-10 (×2): qty 1

## 2023-11-10 MED ORDER — HYDROMORPHONE HCL 1 MG/ML IJ SOLN: 2.0000 mg | INTRAMUSCULAR | Status: DC | PRN

## 2023-11-10 MED ORDER — HYDROMORPHONE HCL-NACL 50-0.9 MG/50ML-% IV SOLN
1.0000 mg/h | INTRAVENOUS | Status: DC
  Administered 2023-11-10 (×2): 1 mg/h via INTRAVENOUS
  Administered 2023-11-10 (×2): 3 mg/h via INTRAVENOUS
  Administered 2023-11-11: 4 mg/h via INTRAVENOUS
  Administered 2023-11-11: 3 mg/h via INTRAVENOUS
  Administered 2023-11-11: 4 mg/h via INTRAVENOUS
  Administered 2023-11-11: 3 mg/h via INTRAVENOUS
  Administered 2023-11-12 – 2023-11-13 (×3): 4 mg/h via INTRAVENOUS
  Filled 2023-11-10 (×7): qty 50

## 2023-11-10 MED ORDER — GLYCOPYRROLATE 0.2 MG/ML IJ SOLN
0.4000 mg | Freq: Three times a day (TID) | INTRAMUSCULAR | Status: DC
  Administered 2023-11-11 – 2023-11-12 (×6): 0.4 mg via INTRAVENOUS
  Filled 2023-11-10 (×9): qty 2

## 2023-11-10 MED ORDER — HYDROMORPHONE BOLUS VIA INFUSION
0.5000 mg | INTRAVENOUS | Status: DC | PRN
  Administered 2023-11-10 – 2023-11-12 (×28): 0.5 mg via INTRAVENOUS

## 2023-11-10 MED ORDER — LORAZEPAM 2 MG/ML IJ SOLN
1.0000 mg | INTRAMUSCULAR | Status: DC | PRN
  Administered 2023-11-10 (×2): 1 mg via INTRAVENOUS
  Filled 2023-11-10: qty 1

## 2023-11-10 NOTE — Consult Note (Signed)
 Consultation Note Date: 11/10/2023   Patient Name: John Davenport  DOB: 01/28/1976  MRN: [id number]  Age / Sex: 48 y.o., male  PCP: Pcp, No Referring Physician: Noralee Elidia Toribio DEWAINE  Reason for Consultation: Establishing goals of care, Non pain symptom management, and Pain control  HPI/Patient Profile: 48 y.o. male   admitted on 11/07/2023 with working diagnosis of heart failure exacerbation.  48 yo male with the past medical history of coronary artery disease, sp CABG, heart failure, paroxysmal ventricular tachycardia, CKD, polysubstance abuse and chronic hepatitis C who presented with dyspnea, edema and abdominal pain.  Recent hospitalization 07/29 to 10/29/23 for heart failure decompensations, he left against medical advice.  Reported progressive and worsening symptoms, for several weeks. Apparently he has not been adherent to his cardiovascular medications.  Chest radiograph with cardiomegaly, bilateral hilar vascular congestion, with prominent pulmonary artery on the right. Central bilateral interstitial infiltrates.    CT chest/ abdomen and pelvis with bilateral ground glass opacities with interlobular septal thickening. No pulmonary embolism, positive centrilobular emphysema, trace right pleural effusion, no acute intra abdominal pathology identified.     Continue poor prognosis, palliative care consulted.  In discussion with attending priority is to keep patient comfortable and avoid suffering.   PMT consulted    Clinical Assessment and Goals of Care:  This NP Ronal Plants reviewed medical records, received report from team, assessed the patient and then meet at the patient's bedside along with his mother/ John Davenport  to discuss diagnosis, prognosis, GOC, EOL wishes disposition and options.    ** I spoke to son/ Marolyn Davenport by phone and he verbalizes understanding and full support for  comfort approach   Concept of Palliative Care was introduced as specialized medical care for people and their families living with serious illness.  If focuses on providing relief from the symptoms and stress of a serious illness.  The goal is to improve quality of life for both the patient and the family.  Values and goals of care important to patient and family were attempted to be elicited.  Created space and opportunity for  family to explore thoughts and feelings regarding current medical situation.  Mother verbalizes a clear understanding of patient's poor prognosis.  Her hope for her son John Davenport at this time,  is that he does not suffer.  She shares her love and appreciation for her son.  She speaks to his many years of ill health.  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      A  discussion was had today regarding advanced directives.  Concepts specific to code status, artifical feeding and hydration, continued IV antibiotics and rehospitalization was had.    The difference between a aggressive medical intervention path  and a palliative comfort care path for this patient at this time was had.      Natural trajectory and expectations at EOL were discussed.  Questions and concerns addressed.  Family   encouraged to call  with questions or concerns.     PMT will continue to support holistically.          No documented H POA or advance care planning documents noted in [facility].  Patient's mother and patient's 60 year old son/John Davenport/ # [phone] are main decision makers.   NEXT OF KIN   SUMMARY OF RECOMMENDATIONS    -Focus of care is comfort and quality and dignity, foregoing life prolonging measures, allowing for natural death.  Code Status/Advance Care Planning: DNR   Symptom Management:  Pain/dyspnea: Dilaudid  drip/with bolus option Agitation: Ativan  Terminal secretions: Robinul   Palliative Prophylaxis:  Eye Care, Frequent  Pain Assessment, and Oral Care  Additional Recommendations (Limitations, Scope, Preferences): Full Comfort Care  Psycho-social/Spiritual:  Desire for further Chaplaincy support:no Additional Recommendations: Education on Hospice  Prognosis:  Hours - Days  Discharge Planning: Anticipated Hospital Death      Primary Diagnoses: Present on Admission:  Polysubstance abuse (HCC)  CAD in native artery  CKD stage 3a, GFR 45-59 ml/min (HCC)  Acute on chronic systolic CHF (congestive heart failure) (HCC)  Hyperlipidemia  History of hepatitis C virus infection  Generalized anxiety disorder   I have reviewed the medical record, interviewed the patient and family, and examined the patient. The following aspects are pertinent.  Past Medical History:  Diagnosis Date   Anxiety state, unspecified    Back pain    Cancer (HCC)    Lung CA   Chronic systolic heart failure (HCC)    a. Ischemic CM (EF 30% by echo in 2011) b. echo 08/2015: EF 20% with severe HK and AK of the inferoseptal and apical myocardium.   Coronary artery disease    a. 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  b. eventually required single vessel CABG (L-LAD) in 03/2009 due to restenosis. c. cath 2013 showed patent LIMA sequential to the diags with LAD filling retrograde, no new disease in RCA and Cx, low LVEDP, sx felt noncardiac. d. 08/2015: NSTEMI: CTO of prox LAD, 99% stenosis of small dCx (no stent), + for cocaine    Esophageal reflux    Heart attack (HCC)    History of hepatitis C virus infection 07/15/2018   07/20/18 Viral RNA negative suggest past, cleared infection   Hypercholesteremia    ICD (implantable cardioverter-defibrillator) infection (HCC)    a. [place] Sci ICD 12/2008.    Ischemic cardiomyopathy    echocardiogram 12/11: Mild LVH, EF 30-35%, anteroseptal and apical akinesis, grade 1 diastolic dysfunction   LBP (low back pain)    OSA (obstructive sleep apnea)  12/30/2010   Paroxysmal ventricular tachycardia (HCC)    Stroke (HCC)    Tobacco abuse    Social History   Socioeconomic History   Marital status: Single    Spouse name: Not on file   Number of children: 1   Years of education: Not on file   Highest education level: Not on file  Occupational History   Occupation: disabled    Employer: UNEMPLOYED  Tobacco Use   Smoking status: Every Day    Current packs/day: 0.50    Average packs/day: 0.5 packs/day for 20.0 years (10.0 ttl pk-yrs)    Types: Cigarettes   Smokeless tobacco: Former    Types: Chew   Tobacco comments:    smoke 0.5 per week  Vaping Use   Vaping status: Never Used  Substance and Sexual Activity   Alcohol use: No   Drug use: Yes  Types: Cocaine    Sexual activity: Not on file  Other Topics Concern   Not on file  Social History Narrative   Pt lives with his mom, brother, and 42 yo son in [place].   Quit tobacco in Jan. 2011 prior to heart surgery (prior -1/2 PPD (down from 1 ppd per day, onset age 27)    alcohol abuse- prior, hasn't drank since age 56   no illicit drug use    Currently unemployed, awaiting disability   Single         Social Drivers of Health   Financial Resource Strain: Low Risk  (01/27/2018)   Overall Financial Resource Strain (CARDIA)    Difficulty of Paying Living Expenses: Not hard at all  Food Insecurity: No Food Insecurity (11/08/2023)   Hunger Vital Sign    Worried About Running Out of Food in the Last Year: Never true    Ran Out of Food in the Last Year: Never true  Transportation Needs: No Transportation Needs (11/08/2023)   PRAPARE - Administrator, Civil Service (Medical): No    Lack of Transportation (Non-Medical): No  Physical Activity: Not on file  Stress: Not on file  Social Connections: Unknown (10/27/2023)   Social Connection and Isolation Panel    Frequency of Communication with Friends and Family: More than three times a week    Frequency of Social  Gatherings with Friends and Family: More than three times a week    Attends Religious Services: Never    Database administrator or Organizations: No    Attends Engineer, structural: Never    Marital Status: Not on file   Family History  Problem Relation Age of Onset   Heart attack Father 34       died at 31 after multiple MI's   COPD Maternal Grandmother    Scheduled Meds:  enoxaparin  (LOVENOX ) injection  40 mg Subcutaneous Daily   escitalopram   10 mg Oral Daily   furosemide   80 mg Intravenous BID   midodrine   5 mg Oral TID WC   QUEtiapine   25 mg Oral QHS   sodium chloride  flush  3 mL Intravenous Q12H   spironolactone   12.5 mg Oral Daily   Continuous Infusions: PRN Meds:.acetaminophen  **OR** acetaminophen , HYDROmorphone  (DILAUDID ) injection, ibuprofen , LORazepam , methocarbamol , senna-docusate, trimethobenzamide  Medications Prior to Admission:  Prior to Admission medications   Medication Sig Start Date End Date Taking? Authorizing Provider  escitalopram  (LEXAPRO ) 10 MG tablet Take 10 mg by mouth daily.   Yes [provider]  QUEtiapine  (SEROQUEL ) 25 MG tablet Take 25 mg by mouth at bedtime.   Yes [provider]   No Known Allergies Review of Systems  Unable to perform ROS: Acuity of condition    Physical Exam Constitutional:      Appearance: He is underweight. He is ill-appearing.     Interventions: Nasal cannula in place.  Cardiovascular:     Rate and Rhythm: Normal rate.  Pulmonary:     Effort: Tachypnea present.  Musculoskeletal:     Comments: Generalized weakness   Skin:    General: Skin is warm and dry.  Neurological:     Mental Status: He is lethargic.     Vital Signs: BP (!) 88/65 (BP Location: Left Arm)   Pulse 89   Temp (!) 96.4 F (35.8 C) (Axillary)   Resp 17   Ht 6' 3 (1.905 m)   Wt 68.9 kg   SpO2 98%  BMI 18.99 kg/m  Pain Scale: 0-10   Pain Score: Asleep   SpO2: SpO2: 98 % O2 Device:SpO2: 98 % O2 Flow Rate:  .O2 Flow Rate (L/min): 4 L/min  IO: Intake/output summary:  Intake/Output Summary (Last 24 hours) at 11/10/2023 1009 Last data filed at 11/10/2023 0645 Gross per 24 hour  Intake --  Output 1625 ml  Net -1625 ml    LBM: Last BM Date : 11/09/23 Baseline Weight: Weight: 72.6 kg Most recent weight: Weight: 68.9 kg     Palliative Assessment/Data:  20 %      Time: 75 minutes  Discussed with Dr. Noralee and bedside RN  Signed by: Ronal Plants, NP   Please contact Palliative Medicine Team phone at [phone] for questions and concerns.  For individual provider: See Tracey

## 2023-11-10 NOTE — Progress Notes (Signed)
COMFORT CARE

## 2023-11-10 NOTE — TOC CM/SW Note (Addendum)
 Transition of Care [facility]) - Inpatient Brief Assessment   Patient Details  Name: John Davenport MRN: [id number] Date of Birth: 05/21/1975  Transition of Care [facility]) CM/SW Contact:    Lauraine FORBES Saa, LCSWA Phone Number: 11/10/2023, 3:37 PM   Clinical Narrative:  3:37 PM Per MD and Palliative Care NP, patient is anticipated to have an in hospital death. TOC will continue to follow and be available to assist.  Transition of Care Asessment: Insurance and Status: Insurance coverage has been reviewed Patient has primary care physician: No (Followed by [facility]) Home environment has been reviewed: Private Residence Prior level of function:: N/A Prior/Current Home Services: No current home services Social Drivers of Health Review: SDOH reviewed no interventions necessary Readmission risk has been reviewed: Yes Transition of care needs: no transition of care needs at this time

## 2023-11-10 NOTE — Plan of Care (Signed)

## 2023-11-10 NOTE — Plan of Care (Signed)
  Problem: Education: Goal: Knowledge of General Education information will improve Description: Including pain rating scale, medication(s)/side effects and non-pharmacologic comfort measures Outcome: Not Applicable   Problem: Health Behavior/Discharge Planning: Goal: Ability to manage health-related needs will improve Outcome: Not Applicable   Problem: Clinical Measurements: Goal: Ability to maintain clinical measurements within normal limits will improve Outcome: Not Applicable Goal: Will remain free from infection Outcome: Not Applicable Goal: Diagnostic test results will improve Outcome: Not Applicable Goal: Respiratory complications will improve Outcome: Not Applicable Goal: Cardiovascular complication will be avoided Outcome: Not Applicable   Problem: Activity: Goal: Risk for activity intolerance will decrease Outcome: Not Applicable   Problem: Nutrition: Goal: Adequate nutrition will be maintained Outcome: Not Applicable   Problem: Coping: Goal: Level of anxiety will decrease Outcome: Not Applicable   Problem: Elimination: Goal: Will not experience complications related to bowel motility Outcome: Not Applicable Goal: Will not experience complications related to urinary retention Outcome: Not Applicable   Problem: Pain Managment: Goal: General experience of comfort will improve and/or be controlled Outcome: Not Applicable   Problem: Safety: Goal: Ability to remain free from injury will improve Outcome: Not Applicable   Problem: Skin Integrity: Goal: Risk for impaired skin integrity will decrease Outcome: Not Applicable   Problem: Education: Goal: Ability to demonstrate management of disease process will improve Outcome: Not Applicable Goal: Ability to verbalize understanding of medication therapies will improve Outcome: Not Applicable Goal: Individualized Educational Video(s) Outcome: Not Applicable   Problem: Activity: Goal: Capacity to carry out  activities will improve Outcome: Not Applicable   Problem: Cardiac: Goal: Ability to achieve and maintain adequate cardiopulmonary perfusion will improve Outcome: Not Applicable

## 2023-11-10 NOTE — Progress Notes (Addendum)
 Progress Note   Patient: John Davenport FMW:[id number] DOB: 03/14/1976 DOA: 11/07/2023     2 DOS: the patient was seen and examined on 11/10/2023   Brief hospital course: Mr. Krall was admitted to the hospital with the working diagnosis of heart failure exacerbation.   48 yo male with the past medical history of coronary artery disease, sp CABG, heart failure, paroxysmal ventricular tachycardia, CKD, polysubstance abuse and chronic hepatitis C who presented with dyspnea, edema and abdominal pain.  Recent hospitalization 07/29 to 10/29/23 for heart failure decompensations, he left against medical advice.  Reported progressive and worsening symptoms, for several weeks. Apparently he has not been adherent to his cardiovascular medications.  On his initial physical examination his blood pressure was 102/78, HR 101, RR 29 and 02 saturation 98%. Lungs with no wheezing or rhonchi, positive increased work of breathing, abdomen with no distention and positive lower extremity edema.   Na 126, K 4.5 Cl 90, bicarbonate 22 glucose 109, bun 61 cr 1,11 BNP > 4.500  High sensitive troponin 23  Wbc 9,7 hgb 11.3 plt 205   Urine analysis SG 1,016, protein 30, leukocytes negative, hgb negative  Toxicology screen positive for cocaine .   Chest radiograph with cardiomegaly, bilateral hilar vascular congestion, with prominent pulmonary artery on the right. Central bilateral interstitial infiltrates.   CT chest/ abdomen and pelvis with bilateral ground glass opacities with interlobular septal thickening. No pulmonary embolism, positive centrilobular emphysema, trace right pleural effusion, no acute intra abdominal pathology identified.   EKG 97 bpm, left axis deviation, left bundle branch block, qtc 520, sinus rhythm with bilateral atrial enlargement, no significant ST segment or T wave changes.   08/11 patient having anxiety and not feeling well, this morning having low temperature.  08/12 continue poor  prognosis, palliative care consulted. Priority to keep patient comfortable and avoid suffering.   Assessment and Plan: * Acute on chronic systolic CHF (congestive heart failure) (HCC) Echocardiogram with suspected small calcified thrombus in the LV apex, reduced LV systolic function with EF 20 to 25%, no wall motion abnormalities, mild to moderate dilatated LV cavity, RV with moderate reduction in systolic function, RV with mild enlargement, LA with moderate dilatation, mild MR, moderate tricuspid valve regurgitation.  Follow up limited echocardiogram with reduced LV systolic functio to < 20%, global hypokinesis, severe LV cavity dilatation, LA with severe dilatation, cannot rule out calcified LV thrombus.   Urine output 1,625 ml  Systolic blood pressure 100 mmHg range  Temp 96.4   Signs of hypoperfusion, cardiogenic shock.   Patient with very poor prognosis, severe heart failure and not candidate for advance therapies.  Plan to continue palliative diuresis with furosemide  80 mg IV bid. Midodrine  for blood pressure support. .    Paroxysmal VT, ICD was removed in 2020 due to bacteremia MSSA with vegetation on the device.   Acute hypoxemic respiratory failure due to acute cardiogenic pulmonary edema Continue palliative diuresis and supplemental 02 per Ar[place]   CAD in native artery Mild troponin elevation due to heart failure no signs of acute coronary syndrome.   CKD stage 3a, GFR 45-59 ml/min (HCC) AKI Hyponatremia,   Worsening renal function with serum cr at 1,37 with K at 3,9 and serum bicarbonate at 26  Na 132   Continue palliative diuresis .   Hyperlipidemia Patient not on statin therapy   History of CVA (cerebrovascular accident) Continue blood pressure monitoring   History of hepatitis C virus infection Follow up as outpatient   Polysubstance  abuse (HCC) Positive cocaine  intoxication  Continue with lorazepam  as needed for anxiety   Generalized anxiety disorder Continue  quetiapine  and escitalopram .      Subjective: Patient has been agitated last night, this morning with eyes closed, not interactive,his family is at the bedside.   Physical Exam: Vitals:   11/09/23 2316 11/10/23 0120 11/10/23 0327 11/10/23 0743  BP: 98/81 111/80 105/82 (!) 88/65  Pulse: 96 (!) 114 84 89  Resp: 15 (!) 24 16 17   Temp: 97.6 F (36.4 C)  (!) 97.4 F (36.3 C) (!) 96.4 F (35.8 C)  TempSrc: Axillary  Axillary Axillary  SpO2: 100% 97% 98%   Weight:   68.9 kg   Height:       Neurology eyes closed, not responding to touch or voice, deconditioned and ill looking appearing  ENT with positive pallor with no icterus  Cardiovascular with S1 and S2 present, tachycardic, positive systolic murmur at the apex.  Positive JVD Respiratory with rales bilaterally with no wheezing or rhonchi  Abdomen with no distention  Positive lower extremity edema ++ Data Reviewed:    Family Communication: I spoke with patient's mother at the bedside, we talked in detail about patient's condition, plan of care and prognosis and all questions were addressed.    Disposition: Status is: Inpatient Remains inpatient appropriate because: palliative care   Planned Discharge Destination: to be determined     Author: Elidia Toribio Furnace, MD 11/10/2023 11:24 AM  For on call review [email or web address].

## 2023-11-10 NOTE — Progress Notes (Signed)
 Patient with worsening condition, positive distress and agitation not controlled with as needed medications. Transitioned to full comfort care.

## 2023-11-11 DIAGNOSIS — I5023 Acute on chronic systolic (congestive) heart failure: Secondary | ICD-10-CM | POA: Diagnosis not present

## 2023-11-11 DIAGNOSIS — R52 Pain, unspecified: Secondary | ICD-10-CM | POA: Diagnosis not present

## 2023-11-11 DIAGNOSIS — F1910 Other psychoactive substance abuse, uncomplicated: Secondary | ICD-10-CM | POA: Diagnosis not present

## 2023-11-11 DIAGNOSIS — N1831 Chronic kidney disease, stage 3a: Secondary | ICD-10-CM | POA: Diagnosis not present

## 2023-11-11 MED ORDER — DIAZEPAM 5 MG/ML IJ SOLN
2.5000 mg | INTRAMUSCULAR | Status: DC | PRN
  Administered 2023-11-11 – 2023-11-12 (×7): 2.5 mg via INTRAVENOUS
  Filled 2023-11-11 (×4): qty 2

## 2023-11-11 NOTE — TOC CM/SW Note (Signed)
 Transition of Care [facility]) - Inpatient Brief Assessment   Patient Details  Name: John Davenport MRN: [id number] Date of Birth: [birth year]  Transition of [facility]) CM/SW Contact:    Lauraine FORBES Saa, LCSWA Phone Number: 11/11/2023, 11:53 AM   Clinical Narrative:  11:53 AM Per progressions and chart review, patient has transitioned to full comfort care.  Transition of Care Asessment: Insurance and Status: Insurance coverage has been reviewed Patient has primary care physician: No (Followed by [facility]) Home environment has been reviewed: Private Residence Prior level of function:: N/A Prior/Current Home Services: No current home services Social Drivers of Health Review: SDOH reviewed no interventions necessary Readmission risk has been reviewed: Yes Transition of care needs: no transition of care needs at this time

## 2023-11-11 NOTE — Progress Notes (Signed)
 Patient seen and examined, mother and aunt at bedside -48/M with CAD/CABG, biventricular failure, CKD, A-fib, polysubstance abuse, recurrent hospitalizations, noncompliance readmitted with worsening heart failure after leaving AMA recently, UDS positive for cocaine  again. - Admitted, restarted on diuretics and GDMT, Dr. Noralee called advanced heart failure team to discuss care, he was not felt to be a candidate for advanced therapies due to noncompliance and ongoing IVDU, palliative consulted -8/12, transitioned to comfort care after palliative care meeting -Anticipate hospital demise, currently on Dilaudid  gtt.  Sigurd Pac, MD

## 2023-11-11 NOTE — Progress Notes (Signed)
 Patient ID: John Davenport, male   DOB: [birth year], 48 y.o.   MRN: [id number]    Progress Note from the Palliative Medicine Team at [facility]   Patient Name: John Davenport        Date: 11/11/2023 DOB: Dec 13, 1975  Age: 48 y.o. MRN#: [id number] Attending Physician: John Frames, MD Primary Care Physician: Pcp, No Admit Date: 11/07/2023   Reason for Consultation/Follow-up   Establishing Goals of Care   HPI/ Brief Hospital Review  48 y.o. male   admitted on 11/07/2023 with working diagnosis of heart failure exacerbation.  48 yo male with the past medical history of coronary artery disease, sp CABG, heart failure, paroxysmal ventricular tachycardia, CKD, polysubstance abuse and chronic hepatitis C who presented with dyspnea, edema and abdominal pain.  Recent hospitalization 07/29 to 10/29/23 for heart failure decompensations, he left against medical advice.  Reported progressive and worsening symptoms, for several weeks.   Per EMR he has not been adherent to his cardiovascular medications.   Chest radiograph with cardiomegaly, bilateral hilar vascular congestion, with prominent pulmonary artery on the right. Central bilateral interstitial infiltrates.    CT chest/ abdomen and pelvis with bilateral ground glass opacities with interlobular septal thickening.     In discussion with attending priority is to keep patient comfortable and avoid suffering.    PMT goals of care meeting supports full comfort, foregoing life prolonging measures, allowing for a natural death.     Subjective  Extensive chart review has been completed prior to meeting with patient/family  including labs, vital signs, imaging, progress/consult notes, orders, medications and available advance directive documents.    This NP assessed patient at the bedside as follow up for palliative medicine needs and emotional support.    Mother and Aunt at bedside   Ongoing conversation regarding current medical  situation,  mother verbalizes understanding of limited prognosis.  Education offered on natural trajectory and expectations at EOL   Plan of Care: - DNR/DNI - forego all life prolonging measures, focus is on comfort and dignity at EOL, allow for a natural death -symptom management, to enhance comfort at EOL  -prognosis is likely hrs to days, expect a hospital death ( family is prepared that death is imminent)    Questions and concerns addressed   Education with  nursing staff regarding symptom management and EOL care   Time: 50  minutes  Detailed review of medical records ( labs, imaging, vital signs), medically appropriate exam ( MS, skin, cardiac,  resp)   discussed with treatment team, counseling and education to patient, family, staff, documenting clinical information, medication management, coordination of care    John Plants NP  Palliative Medicine Team Team Phone # [phone] Pager [phone]

## 2023-11-11 NOTE — Care Management Important Message (Signed)
 Important Message  Patient Details  Name: John Davenport MRN: [id number] Date of Birth: [birth year]   Important Message Given:  Yes - Medicare IM     Claretta Deed 11/11/2023, 3:31 PM

## 2023-11-12 DIAGNOSIS — R451 Restlessness and agitation: Secondary | ICD-10-CM | POA: Diagnosis not present

## 2023-11-12 DIAGNOSIS — I5023 Acute on chronic systolic (congestive) heart failure: Secondary | ICD-10-CM | POA: Diagnosis not present

## 2023-11-12 DIAGNOSIS — Z66 Do not resuscitate: Secondary | ICD-10-CM | POA: Diagnosis not present

## 2023-11-12 DIAGNOSIS — Z515 Encounter for palliative care: Secondary | ICD-10-CM | POA: Diagnosis not present

## 2023-11-12 LAB — CULTURE, BLOOD (ROUTINE X 2)
Culture: NO GROWTH
Culture: NO GROWTH
Special Requests: ADEQUATE

## 2023-11-12 MED ORDER — MIDAZOLAM-SODIUM CHLORIDE 100-0.9 MG/100ML-% IV SOLN
1.0000 mg/h | INTRAVENOUS | Status: DC
  Administered 2023-11-12: 0.5 mg/h via INTRAVENOUS
  Filled 2023-11-12: qty 100

## 2023-11-12 MED ORDER — DIAZEPAM 5 MG/ML IJ SOLN
2.5000 mg | INTRAMUSCULAR | Status: DC | PRN
  Administered 2023-11-12: 2.5 mg via INTRAVENOUS
  Filled 2023-11-12: qty 2

## 2023-11-12 MED ORDER — HYDROMORPHONE BOLUS VIA INFUSION
2.0000 mg | INTRAVENOUS | Status: DC | PRN
  Administered 2023-11-12 – 2023-11-13 (×5): 2 mg via INTRAVENOUS

## 2023-11-12 MED ORDER — MIDAZOLAM BOLUS VIA INFUSION
1.0000 mg | INTRAVENOUS | Status: DC | PRN
  Administered 2023-11-13: 1 mg via INTRAVENOUS

## 2023-11-12 NOTE — Progress Notes (Signed)
 Pt seen and examined, mother at bedside, restless overnight, on high-dose Dilaudid  gtt., now with intermittent periods of anxiety - Palliative following, continue comfort measures -Discuss residential hospice if vitals stay stable  Sigurd Pac, MD

## 2023-11-12 NOTE — Progress Notes (Signed)
 Daily Progress Note   Patient Name: John Davenport       Date: 11/12/2023 DOB: 1975/04/02  Age: 48 y.o. MRN#: [id number] Attending Physician: Fairy Frames, MD Primary Care Physician: Pcp, No Admit Date: 11/07/2023  Reason for Consultation/Follow-up: Establishing goals of care  Subjective: Disoriented, restless, occasional moaning, mother at bedside reports agitation  Length of Stay: 4  Current Medications: Scheduled Meds:   glycopyrrolate   0.4 mg Intravenous TID   sodium chloride  flush  3 mL Intravenous Q12H    Continuous Infusions:  HYDROmorphone  4 mg/hr (11/12/23 1405)   midazolam  1 mg/hr (11/12/23 1238)    PRN Meds: HYDROmorphone , midazolam   Physical Exam Constitutional:      Appearance: He is ill-appearing.     Comments: Appears restless and agitated  Pulmonary:     Effort: Pulmonary effort is normal.  Skin:    General: Skin is warm and dry.             Vital Signs: BP 106/84 (BP Location: Left Arm)   Pulse 97   Temp 98 F (36.7 C) (Axillary)   Resp 17   Ht 6' 3 (1.905 m)   Wt 68.9 kg   SpO2 95%   BMI 18.99 kg/m  SpO2: SpO2: 95 % O2 Device: O2 Device: Nasal Cannula O2 Flow Rate: O2 Flow Rate (L/min): 4 L/min  Intake/output summary:  Intake/Output Summary (Last 24 hours) at 11/12/2023 1517 Last data filed at 11/12/2023 0700 Gross per 24 hour  Intake 112.59 ml  Output 100 ml  Net 12.59 ml   LBM: Last BM Date : 11/10/23 Baseline Weight: Weight: 72.6 kg Most recent weight: Weight: 68.9 kg       Palliative Assessment/Data: PPS 20%      Patient Active Problem List   Diagnosis Date Noted   Acute on chronic HFrEF (heart failure with reduced ejection fraction) (HCC) 11/08/2023   Anxiety 11/08/2023   Acute on chronic systolic CHF (congestive heart  failure) (HCC) 10/27/2023   Polysubstance abuse (HCC) 10/27/2023   Lower extremity weakness 03/29/2020   TIA (transient ischemic attack) 03/28/2020   IV drug user 11/26/2019   History of hepatitis C virus infection 07/15/2018   Hyponatremia 07/15/2018   Opioid use disorder, severe, in early remission (HCC) 07/13/2018   Hypotension 05/23/2016   History of CVA (cerebrovascular accident)    Chronic systolic congestive heart failure (HCC)    CKD stage 3a, GFR 45-59 ml/min (HCC) 03/09/2016   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 male   admitted on 11/07/2023 with working diagnosis of heart failure exacerbation.  Past medical history of coronary artery disease, sp CABG, heart failure, paroxysmal ventricular tachycardia, CKD, polysubstance abuse and chronic hepatitis C who presented with dyspnea, edema and abdominal pain.  Recent hospitalization 07/29 to 10/29/23 for heart failure decompensations, he left against medical advice.  Reported progressive and worsening symptoms, for several weeks.    Per EMR he has not been adherent to his cardiovascular medications.  Chest radiograph with cardiomegaly, bilateral hilar vascular congestion, with prominent pulmonary artery on the right. Central bilateral interstitial infiltrates.    CT chest/ abdomen and pelvis with bilateral ground glass opacities with interlobular septal thickening.     In discussion with attending priority is to keep patient comfortable and avoid suffering.    PMT goals of care meeting supports full comfort, foregoing life prolonging measures, allowing for a natural death.  Assessment: Follow up today with patient - continues on full comfort care. He is disoriented x4. Mother at bedside. She  reports frequent bouts of agitation and discomfort. RN reports providing boluses of dilaudid  in attempts to promote comfort.   Discussed with mother option to initiate versed  infusion to help with agitation. She agrees. Discussed with RN. Will start versed  at 1 mg/hr with prn boluses.  Briefly discussed hospice facility - family not interested in transfer at this time.   Discussed expectations moving forward.   Recommendations/Plan: Initiate versed  infusion for agitation Increase dilaudid  bolus, continue continuous infusion Continue comfort measures only Anticipate hospital death  Goals of Care and Additional Recommendations: Limitations on Scope of Treatment: Full Comfort Care  Code Status: DNR  Prognosis:  < 2 weeks  Discharge Planning: Anticipated Hospital Death  Care plan was discussed with RN, patient's mother, Dr Fairy  Thank you for allowing the Palliative Medicine Team to assist in the care of this patient.   *Please note that this is a verbal dictation therefore any spelling or grammatical errors are due to the Dragon [employer] One system interpretation.  Tobey Jama Barnacle, DNP, [facility] Palliative Medicine Team Team Phone # [phone]  Pager [phone]

## 2023-11-16 ENCOUNTER — Ambulatory Visit: Admitting: Internal Medicine

## 2023-11-30 NOTE — Progress Notes (Signed)
 Received report from April, RN.

## 2023-11-30 NOTE — Progress Notes (Signed)
    Patient Name: John Davenport           DOB: 12-24-1975  MRN: [id number]       OVERNIGHT EVENT    Notified by RN that patient has expired at 0500 .  2 RN verified. Patient was comfort care.   Family at bedside.    Xavior Niazi, DNP, ACNPC- AG [facility] [place] [facility]

## 2023-11-30 NOTE — Progress Notes (Signed)
 Pt arrived to the unit, family at bedside.

## 2023-11-30 NOTE — Progress Notes (Signed)
 Pt pronounced deceased by Freddrick, Charity fundraiser and myself.   There was 33ml of Dilaudid  and 60ml of Versed  wasted and [place], RN was witness.

## 2023-11-30 DEATH — deceased

## 2023-12-30 NOTE — Death Summary Note (Signed)
   DEATH SUMMARY   Patient Details  Name: John Davenport MRN: [id number] DOB: Jul 21, 1975 PCP:Pcp, No Admission/Discharge Information   Admit Date:  11-12-23  Date of Death: Date of Death: 11-18-23  Time of Death: Time of Death: 0500  Length of Stay: 5   Principle Cause of death: CHF  Hospital Diagnoses: Principal Problem:   Acute on chronic systolic CHF (congestive heart failure) (HCC) Active Problems:   CAD in native artery   CKD stage 3a, GFR 45-59 ml/min (HCC)   Hyperlipidemia   History of CVA (cerebrovascular accident)   History of hepatitis C virus infection   Polysubstance abuse (HCC)   Generalized anxiety disorder   Hospital Course: No notes on file  Assessment and Plan:  48/M with CAD/CABG, biventricular failure, CKD, A-fib, polysubstance abuse, recurrent hospitalizations, noncompliance readmitted with worsening heart failure after leaving AMA recently, UDS positive for cocaine  again. -Admitted with severe biventricular failure, admitted with cardiogenic shock, low output failure yet again -restarted on diuretics and GDMT, Dr. Noralee called advanced heart failure team this week, to discuss care, he was not felt to be a candidate for advanced therapies due to noncompliance and ongoing IVDU, palliative consulted -Poor response to diuretics, palliative care meeting -8/12, transitioned to comfort care after palliative care meeting - Expired on 2023/11/18  CAD in native artery Mild troponin elevation due to heart failure no signs of acute coronary syndrome.   CKD stage 3b  Hyperlipidemia  History of CVA (cerebrovascular accident)  History of hepatitis C virus infection  Polysubstance abuse (HCC) Positive cocaine  intoxication  Continue with lorazepam  as needed for anxiety   Generalized anxiety disorder Continue quetiapine  and escitalopram .           The results of significant diagnostics from this hospitalization (including imaging, microbiology,  ancillary and laboratory) are listed below for reference.   Significant Diagnostic Studies: No results found.  Microbiology: No results found for this or any previous visit (from the past 240 hours).  Time spent: 45 minutes  Signed: Sigurd Pac, MD 2023/11/18

## 2024-01-06 ENCOUNTER — Ambulatory Visit: Payer: Self-pay | Admitting: Nurse Practitioner
